# Patient Record
Sex: Male | Born: 1935 | Race: White | Hispanic: No | Marital: Married | Smoking: Former smoker
Health system: Southern US, Community
[De-identification: ages and names within clinical notes are randomized; demographics above are authoritative.]

## PROBLEM LIST (undated history)

## (undated) DIAGNOSIS — R51 Headache: Secondary | ICD-10-CM

## (undated) DIAGNOSIS — Z950 Presence of cardiac pacemaker: Secondary | ICD-10-CM

## (undated) DIAGNOSIS — Z8679 Personal history of other diseases of the circulatory system: Secondary | ICD-10-CM

## (undated) DIAGNOSIS — E118 Type 2 diabetes mellitus with unspecified complications: Secondary | ICD-10-CM

## (undated) DIAGNOSIS — K219 Gastro-esophageal reflux disease without esophagitis: Secondary | ICD-10-CM

## (undated) DIAGNOSIS — F419 Anxiety disorder, unspecified: Secondary | ICD-10-CM

## (undated) DIAGNOSIS — I428 Other cardiomyopathies: Secondary | ICD-10-CM

## (undated) DIAGNOSIS — E785 Hyperlipidemia, unspecified: Secondary | ICD-10-CM

## (undated) DIAGNOSIS — G309 Alzheimer's disease, unspecified: Secondary | ICD-10-CM

## (undated) DIAGNOSIS — F329 Major depressive disorder, single episode, unspecified: Secondary | ICD-10-CM

## (undated) DIAGNOSIS — F0280 Dementia in other diseases classified elsewhere without behavioral disturbance: Secondary | ICD-10-CM

## (undated) DIAGNOSIS — G4733 Obstructive sleep apnea (adult) (pediatric): Secondary | ICD-10-CM

## (undated) DIAGNOSIS — M1990 Unspecified osteoarthritis, unspecified site: Secondary | ICD-10-CM

## (undated) DIAGNOSIS — I2510 Atherosclerotic heart disease of native coronary artery without angina pectoris: Secondary | ICD-10-CM

## (undated) DIAGNOSIS — K5730 Diverticulosis of large intestine without perforation or abscess without bleeding: Secondary | ICD-10-CM

## (undated) DIAGNOSIS — G4730 Sleep apnea, unspecified: Secondary | ICD-10-CM

## (undated) DIAGNOSIS — I639 Cerebral infarction, unspecified: Secondary | ICD-10-CM

## (undated) DIAGNOSIS — K222 Esophageal obstruction: Secondary | ICD-10-CM

## (undated) DIAGNOSIS — R7309 Other abnormal glucose: Secondary | ICD-10-CM

## (undated) DIAGNOSIS — J309 Allergic rhinitis, unspecified: Secondary | ICD-10-CM

## (undated) DIAGNOSIS — Z8546 Personal history of malignant neoplasm of prostate: Secondary | ICD-10-CM

## (undated) DIAGNOSIS — F1010 Alcohol abuse, uncomplicated: Secondary | ICD-10-CM

## (undated) DIAGNOSIS — I10 Essential (primary) hypertension: Secondary | ICD-10-CM

## (undated) DIAGNOSIS — F32A Depression, unspecified: Secondary | ICD-10-CM

## (undated) DIAGNOSIS — I4891 Unspecified atrial fibrillation: Secondary | ICD-10-CM

## (undated) DIAGNOSIS — J45909 Unspecified asthma, uncomplicated: Secondary | ICD-10-CM

## (undated) DIAGNOSIS — R001 Bradycardia, unspecified: Secondary | ICD-10-CM

## (undated) DIAGNOSIS — Z86010 Personal history of colonic polyps: Secondary | ICD-10-CM

## (undated) DIAGNOSIS — I219 Acute myocardial infarction, unspecified: Secondary | ICD-10-CM

## (undated) DIAGNOSIS — J383 Other diseases of vocal cords: Secondary | ICD-10-CM

## (undated) DIAGNOSIS — I5022 Chronic systolic (congestive) heart failure: Secondary | ICD-10-CM

## (undated) DIAGNOSIS — Z95810 Presence of automatic (implantable) cardiac defibrillator: Secondary | ICD-10-CM

## (undated) DIAGNOSIS — E291 Testicular hypofunction: Secondary | ICD-10-CM

## (undated) DIAGNOSIS — C801 Malignant (primary) neoplasm, unspecified: Secondary | ICD-10-CM

## (undated) DIAGNOSIS — T7840XA Allergy, unspecified, initial encounter: Secondary | ICD-10-CM

## (undated) DIAGNOSIS — J189 Pneumonia, unspecified organism: Secondary | ICD-10-CM

## (undated) DIAGNOSIS — G4700 Insomnia, unspecified: Secondary | ICD-10-CM

## (undated) HISTORY — DX: Type 2 diabetes mellitus with unspecified complications: E11.8

## (undated) HISTORY — DX: Dementia in other diseases classified elsewhere, unspecified severity, without behavioral disturbance, psychotic disturbance, mood disturbance, and anxiety: F02.80

## (undated) HISTORY — DX: Other cardiomyopathies: I42.8

## (undated) HISTORY — PX: INSERT / REPLACE / REMOVE PACEMAKER: SUR710

## (undated) HISTORY — DX: Essential (primary) hypertension: I10

## (undated) HISTORY — PX: BACK SURGERY: SHX140

## (undated) HISTORY — DX: Esophageal obstruction: K22.2

## (undated) HISTORY — DX: Personal history of colonic polyps: Z86.010

## (undated) HISTORY — DX: Insomnia, unspecified: G47.00

## (undated) HISTORY — PX: TONSILLECTOMY: SUR1361

## (undated) HISTORY — DX: Testicular hypofunction: E29.1

## (undated) HISTORY — PX: OTHER SURGICAL HISTORY: SHX169

## (undated) HISTORY — DX: Allergic rhinitis, unspecified: J30.9

## (undated) HISTORY — DX: Gastro-esophageal reflux disease without esophagitis: K21.9

## (undated) HISTORY — DX: Alzheimer's disease, unspecified: G30.9

## (undated) HISTORY — DX: Other diseases of vocal cords: J38.3

## (undated) HISTORY — PX: ESOPHAGOGASTRODUODENOSCOPY: SHX1529

## (undated) HISTORY — DX: Hyperlipidemia, unspecified: E78.5

## (undated) HISTORY — PX: COLONOSCOPY: SHX174

## (undated) HISTORY — DX: Diverticulosis of large intestine without perforation or abscess without bleeding: K57.30

## (undated) HISTORY — DX: Personal history of other diseases of the circulatory system: Z86.79

## (undated) HISTORY — DX: Sleep apnea, unspecified: G47.30

## (undated) HISTORY — DX: Personal history of malignant neoplasm of prostate: Z85.46

## (undated) HISTORY — PX: TONSILECTOMY, ADENOIDECTOMY, BILATERAL MYRINGOTOMY AND TUBES: SHX2538

## (undated) HISTORY — DX: Chronic systolic (congestive) heart failure: I50.22

## (undated) HISTORY — PX: KNEE ARTHROSCOPY: SUR90

## (undated) HISTORY — DX: Obstructive sleep apnea (adult) (pediatric): G47.33

## (undated) HISTORY — PX: CARDIAC CATHETERIZATION: SHX172

## (undated) HISTORY — DX: Alcohol abuse, uncomplicated: F10.10

## (undated) HISTORY — DX: Cerebral infarction, unspecified: I63.9

## (undated) HISTORY — DX: Unspecified atrial fibrillation: I48.91

## (undated) HISTORY — DX: Other abnormal glucose: R73.09

## (undated) HISTORY — DX: Atherosclerotic heart disease of native coronary artery without angina pectoris: I25.10

## (undated) HISTORY — DX: Allergy, unspecified, initial encounter: T78.40XA

## (undated) HISTORY — DX: Unspecified asthma, uncomplicated: J45.909

---

## 1991-01-25 DIAGNOSIS — I219 Acute myocardial infarction, unspecified: Secondary | ICD-10-CM

## 1991-01-25 HISTORY — DX: Acute myocardial infarction, unspecified: I21.9

## 1997-07-10 ENCOUNTER — Ambulatory Visit (HOSPITAL_COMMUNITY): Admission: RE | Admit: 1997-07-10 | Discharge: 1997-07-10 | Payer: Self-pay | Admitting: Internal Medicine

## 1998-10-01 ENCOUNTER — Ambulatory Visit (HOSPITAL_COMMUNITY): Admission: RE | Admit: 1998-10-01 | Discharge: 1998-10-01 | Payer: Self-pay | Admitting: Urology

## 1999-02-19 ENCOUNTER — Encounter (INDEPENDENT_AMBULATORY_CARE_PROVIDER_SITE_OTHER): Payer: Self-pay | Admitting: Specialist

## 1999-02-19 ENCOUNTER — Other Ambulatory Visit: Admission: RE | Admit: 1999-02-19 | Discharge: 1999-02-19 | Payer: Self-pay | Admitting: Internal Medicine

## 1999-06-30 ENCOUNTER — Encounter: Payer: Self-pay | Admitting: Emergency Medicine

## 1999-06-30 ENCOUNTER — Emergency Department (HOSPITAL_COMMUNITY): Admission: EM | Admit: 1999-06-30 | Discharge: 1999-06-30 | Payer: Self-pay | Admitting: Emergency Medicine

## 1999-12-10 ENCOUNTER — Encounter: Payer: Self-pay | Admitting: Internal Medicine

## 1999-12-10 ENCOUNTER — Ambulatory Visit (HOSPITAL_COMMUNITY): Admission: RE | Admit: 1999-12-10 | Discharge: 1999-12-10 | Payer: Self-pay | Admitting: Internal Medicine

## 2000-01-07 ENCOUNTER — Other Ambulatory Visit: Admission: RE | Admit: 2000-01-07 | Discharge: 2000-01-07 | Payer: Self-pay | Admitting: Urology

## 2000-01-07 ENCOUNTER — Encounter (INDEPENDENT_AMBULATORY_CARE_PROVIDER_SITE_OTHER): Payer: Self-pay | Admitting: Specialist

## 2000-01-25 DIAGNOSIS — C801 Malignant (primary) neoplasm, unspecified: Secondary | ICD-10-CM

## 2000-01-25 HISTORY — DX: Malignant (primary) neoplasm, unspecified: C80.1

## 2000-02-25 DIAGNOSIS — Z8546 Personal history of malignant neoplasm of prostate: Secondary | ICD-10-CM

## 2000-02-25 HISTORY — PX: PROSTATE SURGERY: SHX751

## 2000-02-25 HISTORY — DX: Personal history of malignant neoplasm of prostate: Z85.46

## 2000-03-08 ENCOUNTER — Encounter (INDEPENDENT_AMBULATORY_CARE_PROVIDER_SITE_OTHER): Payer: Self-pay | Admitting: Specialist

## 2000-03-08 ENCOUNTER — Inpatient Hospital Stay (HOSPITAL_COMMUNITY): Admission: RE | Admit: 2000-03-08 | Discharge: 2000-03-12 | Payer: Self-pay | Admitting: Urology

## 2000-07-07 ENCOUNTER — Ambulatory Visit (HOSPITAL_COMMUNITY): Admission: RE | Admit: 2000-07-07 | Discharge: 2000-07-07 | Payer: Self-pay | Admitting: Internal Medicine

## 2002-06-13 ENCOUNTER — Emergency Department (HOSPITAL_COMMUNITY): Admission: EM | Admit: 2002-06-13 | Discharge: 2002-06-13 | Payer: Self-pay | Admitting: Emergency Medicine

## 2004-02-24 ENCOUNTER — Ambulatory Visit: Payer: Self-pay | Admitting: Internal Medicine

## 2004-03-02 ENCOUNTER — Ambulatory Visit: Payer: Self-pay | Admitting: Internal Medicine

## 2004-03-19 ENCOUNTER — Ambulatory Visit: Payer: Self-pay | Admitting: Internal Medicine

## 2004-03-24 ENCOUNTER — Ambulatory Visit: Payer: Self-pay | Admitting: Internal Medicine

## 2004-03-29 ENCOUNTER — Ambulatory Visit: Payer: Self-pay | Admitting: Internal Medicine

## 2004-03-31 ENCOUNTER — Ambulatory Visit: Payer: Self-pay | Admitting: Internal Medicine

## 2004-04-08 ENCOUNTER — Ambulatory Visit: Payer: Self-pay | Admitting: Internal Medicine

## 2004-04-12 ENCOUNTER — Ambulatory Visit: Payer: Self-pay | Admitting: Internal Medicine

## 2004-04-19 ENCOUNTER — Ambulatory Visit: Payer: Self-pay | Admitting: Internal Medicine

## 2004-04-28 ENCOUNTER — Ambulatory Visit: Payer: Self-pay | Admitting: Internal Medicine

## 2004-05-03 ENCOUNTER — Ambulatory Visit: Payer: Self-pay | Admitting: Internal Medicine

## 2004-05-13 ENCOUNTER — Ambulatory Visit: Payer: Self-pay | Admitting: Internal Medicine

## 2004-05-19 ENCOUNTER — Ambulatory Visit: Payer: Self-pay | Admitting: Internal Medicine

## 2004-05-26 ENCOUNTER — Ambulatory Visit: Payer: Self-pay | Admitting: Internal Medicine

## 2004-05-27 ENCOUNTER — Ambulatory Visit (HOSPITAL_COMMUNITY): Admission: RE | Admit: 2004-05-27 | Discharge: 2004-05-27 | Payer: Self-pay | Admitting: Internal Medicine

## 2004-05-27 ENCOUNTER — Ambulatory Visit: Payer: Self-pay | Admitting: Internal Medicine

## 2004-06-03 ENCOUNTER — Ambulatory Visit: Payer: Self-pay | Admitting: Internal Medicine

## 2004-06-24 ENCOUNTER — Ambulatory Visit: Payer: Self-pay | Admitting: Internal Medicine

## 2004-07-08 ENCOUNTER — Ambulatory Visit: Payer: Self-pay | Admitting: Internal Medicine

## 2004-07-13 ENCOUNTER — Ambulatory Visit: Payer: Self-pay | Admitting: Internal Medicine

## 2004-07-29 ENCOUNTER — Ambulatory Visit: Payer: Self-pay | Admitting: Internal Medicine

## 2004-08-02 ENCOUNTER — Ambulatory Visit: Payer: Self-pay | Admitting: Internal Medicine

## 2004-08-09 ENCOUNTER — Ambulatory Visit: Payer: Self-pay | Admitting: Internal Medicine

## 2004-08-16 ENCOUNTER — Ambulatory Visit: Payer: Self-pay | Admitting: Internal Medicine

## 2004-08-17 ENCOUNTER — Ambulatory Visit: Payer: Self-pay | Admitting: Internal Medicine

## 2004-08-31 ENCOUNTER — Ambulatory Visit: Payer: Self-pay | Admitting: Internal Medicine

## 2004-09-09 ENCOUNTER — Ambulatory Visit: Payer: Self-pay | Admitting: Internal Medicine

## 2004-09-17 ENCOUNTER — Ambulatory Visit: Payer: Self-pay | Admitting: Internal Medicine

## 2004-09-22 ENCOUNTER — Ambulatory Visit: Payer: Self-pay | Admitting: Internal Medicine

## 2004-09-30 ENCOUNTER — Ambulatory Visit: Payer: Self-pay | Admitting: Internal Medicine

## 2004-10-06 ENCOUNTER — Ambulatory Visit: Payer: Self-pay | Admitting: Internal Medicine

## 2004-10-13 ENCOUNTER — Ambulatory Visit: Payer: Self-pay | Admitting: Internal Medicine

## 2004-10-17 ENCOUNTER — Emergency Department (HOSPITAL_COMMUNITY): Admission: EM | Admit: 2004-10-17 | Discharge: 2004-10-17 | Payer: Self-pay | Admitting: Emergency Medicine

## 2004-11-11 ENCOUNTER — Ambulatory Visit: Payer: Self-pay | Admitting: Internal Medicine

## 2004-11-18 ENCOUNTER — Ambulatory Visit: Payer: Self-pay | Admitting: Internal Medicine

## 2004-11-26 ENCOUNTER — Ambulatory Visit: Payer: Self-pay | Admitting: Internal Medicine

## 2004-12-10 ENCOUNTER — Ambulatory Visit: Payer: Self-pay | Admitting: Internal Medicine

## 2004-12-13 ENCOUNTER — Encounter: Admission: RE | Admit: 2004-12-13 | Discharge: 2004-12-13 | Payer: Self-pay | Admitting: Internal Medicine

## 2004-12-22 ENCOUNTER — Ambulatory Visit: Payer: Self-pay | Admitting: Internal Medicine

## 2004-12-29 ENCOUNTER — Ambulatory Visit: Payer: Self-pay | Admitting: Internal Medicine

## 2005-01-05 ENCOUNTER — Ambulatory Visit: Payer: Self-pay | Admitting: Internal Medicine

## 2005-01-06 ENCOUNTER — Ambulatory Visit: Payer: Self-pay | Admitting: Internal Medicine

## 2005-01-12 ENCOUNTER — Ambulatory Visit: Payer: Self-pay | Admitting: Internal Medicine

## 2005-01-26 ENCOUNTER — Ambulatory Visit: Payer: Self-pay | Admitting: Internal Medicine

## 2005-02-17 ENCOUNTER — Ambulatory Visit: Payer: Self-pay | Admitting: Internal Medicine

## 2005-02-25 ENCOUNTER — Ambulatory Visit: Payer: Self-pay | Admitting: Internal Medicine

## 2005-03-16 ENCOUNTER — Ambulatory Visit: Payer: Self-pay | Admitting: Internal Medicine

## 2005-03-24 ENCOUNTER — Ambulatory Visit: Payer: Self-pay | Admitting: Internal Medicine

## 2005-03-25 ENCOUNTER — Ambulatory Visit: Payer: Self-pay | Admitting: Internal Medicine

## 2005-04-04 ENCOUNTER — Ambulatory Visit: Payer: Self-pay | Admitting: Internal Medicine

## 2005-04-06 ENCOUNTER — Ambulatory Visit: Payer: Self-pay | Admitting: Cardiology

## 2005-04-11 ENCOUNTER — Ambulatory Visit: Payer: Self-pay | Admitting: Internal Medicine

## 2005-04-13 ENCOUNTER — Ambulatory Visit: Payer: Self-pay

## 2005-04-18 ENCOUNTER — Ambulatory Visit: Payer: Self-pay | Admitting: Internal Medicine

## 2005-04-25 ENCOUNTER — Ambulatory Visit: Payer: Self-pay | Admitting: Internal Medicine

## 2005-05-04 ENCOUNTER — Ambulatory Visit: Payer: Self-pay | Admitting: Internal Medicine

## 2005-05-05 ENCOUNTER — Ambulatory Visit: Payer: Self-pay | Admitting: Internal Medicine

## 2005-05-12 ENCOUNTER — Ambulatory Visit: Payer: Self-pay | Admitting: Internal Medicine

## 2005-05-16 ENCOUNTER — Ambulatory Visit: Payer: Self-pay | Admitting: Internal Medicine

## 2005-06-17 ENCOUNTER — Ambulatory Visit: Payer: Self-pay | Admitting: Internal Medicine

## 2005-06-27 ENCOUNTER — Ambulatory Visit: Payer: Self-pay | Admitting: Internal Medicine

## 2005-07-04 ENCOUNTER — Ambulatory Visit: Payer: Self-pay | Admitting: Internal Medicine

## 2005-07-14 ENCOUNTER — Ambulatory Visit: Payer: Self-pay | Admitting: Internal Medicine

## 2005-07-18 ENCOUNTER — Ambulatory Visit: Payer: Self-pay | Admitting: Internal Medicine

## 2005-08-03 ENCOUNTER — Ambulatory Visit: Payer: Self-pay | Admitting: Internal Medicine

## 2005-08-24 ENCOUNTER — Ambulatory Visit: Payer: Self-pay | Admitting: Internal Medicine

## 2005-08-25 ENCOUNTER — Ambulatory Visit: Payer: Self-pay | Admitting: Internal Medicine

## 2005-08-31 ENCOUNTER — Ambulatory Visit: Payer: Self-pay | Admitting: Internal Medicine

## 2005-09-05 ENCOUNTER — Ambulatory Visit: Payer: Self-pay | Admitting: Internal Medicine

## 2005-09-19 ENCOUNTER — Ambulatory Visit: Payer: Self-pay | Admitting: Internal Medicine

## 2005-09-28 ENCOUNTER — Ambulatory Visit: Payer: Self-pay | Admitting: Internal Medicine

## 2005-10-05 ENCOUNTER — Ambulatory Visit: Payer: Self-pay | Admitting: Internal Medicine

## 2005-10-20 ENCOUNTER — Ambulatory Visit: Payer: Self-pay | Admitting: Internal Medicine

## 2005-10-31 ENCOUNTER — Ambulatory Visit: Payer: Self-pay | Admitting: Internal Medicine

## 2005-11-07 ENCOUNTER — Ambulatory Visit: Payer: Self-pay | Admitting: Internal Medicine

## 2005-11-14 ENCOUNTER — Ambulatory Visit: Payer: Self-pay | Admitting: Internal Medicine

## 2005-11-24 ENCOUNTER — Ambulatory Visit: Payer: Self-pay | Admitting: Internal Medicine

## 2005-12-07 ENCOUNTER — Ambulatory Visit: Payer: Self-pay | Admitting: Internal Medicine

## 2005-12-26 ENCOUNTER — Ambulatory Visit: Payer: Self-pay | Admitting: Internal Medicine

## 2006-01-09 ENCOUNTER — Ambulatory Visit: Payer: Self-pay | Admitting: Internal Medicine

## 2006-01-19 ENCOUNTER — Ambulatory Visit: Payer: Self-pay | Admitting: Internal Medicine

## 2006-01-26 ENCOUNTER — Ambulatory Visit: Payer: Self-pay | Admitting: Internal Medicine

## 2006-01-26 ENCOUNTER — Ambulatory Visit: Payer: Self-pay | Admitting: Psychology

## 2006-02-08 ENCOUNTER — Ambulatory Visit: Payer: Self-pay | Admitting: Internal Medicine

## 2006-02-22 ENCOUNTER — Ambulatory Visit: Payer: Self-pay | Admitting: Internal Medicine

## 2006-03-08 ENCOUNTER — Ambulatory Visit: Payer: Self-pay | Admitting: Internal Medicine

## 2006-03-15 ENCOUNTER — Ambulatory Visit: Payer: Self-pay | Admitting: Internal Medicine

## 2006-03-22 ENCOUNTER — Ambulatory Visit: Payer: Self-pay | Admitting: Internal Medicine

## 2006-03-27 ENCOUNTER — Ambulatory Visit: Payer: Self-pay | Admitting: Internal Medicine

## 2006-04-03 ENCOUNTER — Ambulatory Visit: Payer: Self-pay | Admitting: Internal Medicine

## 2006-04-10 ENCOUNTER — Ambulatory Visit: Payer: Self-pay | Admitting: Internal Medicine

## 2006-04-19 ENCOUNTER — Ambulatory Visit: Payer: Self-pay | Admitting: Internal Medicine

## 2006-04-27 ENCOUNTER — Ambulatory Visit: Payer: Self-pay | Admitting: Internal Medicine

## 2006-05-01 ENCOUNTER — Ambulatory Visit: Payer: Self-pay | Admitting: Internal Medicine

## 2006-05-11 ENCOUNTER — Ambulatory Visit: Payer: Self-pay | Admitting: Internal Medicine

## 2006-05-17 ENCOUNTER — Ambulatory Visit: Payer: Self-pay | Admitting: Internal Medicine

## 2006-06-07 ENCOUNTER — Ambulatory Visit: Payer: Self-pay | Admitting: Internal Medicine

## 2006-06-12 ENCOUNTER — Ambulatory Visit: Payer: Self-pay | Admitting: Internal Medicine

## 2006-06-21 ENCOUNTER — Ambulatory Visit: Payer: Self-pay | Admitting: Internal Medicine

## 2006-06-23 ENCOUNTER — Ambulatory Visit: Payer: Self-pay | Admitting: Internal Medicine

## 2006-06-28 ENCOUNTER — Ambulatory Visit: Payer: Self-pay | Admitting: Internal Medicine

## 2006-07-05 ENCOUNTER — Ambulatory Visit: Payer: Self-pay | Admitting: Internal Medicine

## 2006-07-12 ENCOUNTER — Ambulatory Visit: Payer: Self-pay | Admitting: Internal Medicine

## 2006-07-31 ENCOUNTER — Ambulatory Visit: Payer: Self-pay | Admitting: Internal Medicine

## 2006-08-04 ENCOUNTER — Ambulatory Visit: Payer: Self-pay | Admitting: Cardiology

## 2006-08-04 ENCOUNTER — Encounter: Payer: Self-pay | Admitting: Internal Medicine

## 2006-08-04 ENCOUNTER — Ambulatory Visit: Payer: Self-pay | Admitting: Internal Medicine

## 2006-08-10 ENCOUNTER — Ambulatory Visit: Payer: Self-pay | Admitting: Internal Medicine

## 2006-09-06 ENCOUNTER — Ambulatory Visit: Payer: Self-pay | Admitting: Internal Medicine

## 2006-09-22 ENCOUNTER — Ambulatory Visit: Payer: Self-pay | Admitting: Internal Medicine

## 2006-10-04 ENCOUNTER — Ambulatory Visit: Payer: Self-pay | Admitting: Internal Medicine

## 2006-10-09 ENCOUNTER — Ambulatory Visit: Payer: Self-pay | Admitting: Internal Medicine

## 2006-10-19 ENCOUNTER — Ambulatory Visit: Payer: Self-pay | Admitting: Internal Medicine

## 2006-10-25 ENCOUNTER — Ambulatory Visit: Payer: Self-pay | Admitting: Internal Medicine

## 2006-11-01 ENCOUNTER — Ambulatory Visit: Payer: Self-pay | Admitting: Internal Medicine

## 2006-11-15 ENCOUNTER — Ambulatory Visit: Payer: Self-pay | Admitting: Internal Medicine

## 2006-11-16 ENCOUNTER — Ambulatory Visit: Payer: Self-pay | Admitting: Internal Medicine

## 2006-11-16 DIAGNOSIS — R739 Hyperglycemia, unspecified: Secondary | ICD-10-CM | POA: Insufficient documentation

## 2006-11-16 DIAGNOSIS — I10 Essential (primary) hypertension: Secondary | ICD-10-CM | POA: Insufficient documentation

## 2006-11-16 DIAGNOSIS — Z8679 Personal history of other diseases of the circulatory system: Secondary | ICD-10-CM

## 2006-11-16 DIAGNOSIS — I4891 Unspecified atrial fibrillation: Secondary | ICD-10-CM

## 2006-11-16 DIAGNOSIS — J45909 Unspecified asthma, uncomplicated: Secondary | ICD-10-CM

## 2006-11-16 DIAGNOSIS — I2510 Atherosclerotic heart disease of native coronary artery without angina pectoris: Secondary | ICD-10-CM

## 2006-11-16 DIAGNOSIS — R7309 Other abnormal glucose: Secondary | ICD-10-CM

## 2006-11-16 DIAGNOSIS — J209 Acute bronchitis, unspecified: Secondary | ICD-10-CM

## 2006-11-16 DIAGNOSIS — I252 Old myocardial infarction: Secondary | ICD-10-CM | POA: Insufficient documentation

## 2006-11-16 HISTORY — DX: Unspecified asthma, uncomplicated: J45.909

## 2006-11-16 HISTORY — DX: Other abnormal glucose: R73.09

## 2006-11-16 HISTORY — DX: Personal history of other diseases of the circulatory system: Z86.79

## 2006-11-16 HISTORY — DX: Essential (primary) hypertension: I10

## 2006-11-16 HISTORY — DX: Unspecified atrial fibrillation: I48.91

## 2006-11-16 HISTORY — DX: Atherosclerotic heart disease of native coronary artery without angina pectoris: I25.10

## 2006-11-20 ENCOUNTER — Ambulatory Visit: Payer: Self-pay | Admitting: Internal Medicine

## 2006-11-29 ENCOUNTER — Ambulatory Visit: Payer: Self-pay | Admitting: Internal Medicine

## 2006-12-04 ENCOUNTER — Ambulatory Visit: Payer: Self-pay | Admitting: Internal Medicine

## 2006-12-13 ENCOUNTER — Ambulatory Visit: Payer: Self-pay | Admitting: Internal Medicine

## 2006-12-13 LAB — CONVERTED CEMR LAB
ALT: 16 units/L (ref 0–53)
AST: 24 units/L (ref 0–37)
Albumin: 4.2 g/dL (ref 3.5–5.2)
Alkaline Phosphatase: 69 units/L (ref 39–117)
BUN: 13 mg/dL (ref 6–23)
Basophils Absolute: 0 10*3/uL (ref 0.0–0.1)
Basophils Relative: 0.4 % (ref 0.0–1.0)
Bilirubin, Direct: 0.2 mg/dL (ref 0.0–0.3)
CO2: 33 meq/L — ABNORMAL HIGH (ref 19–32)
Calcium: 10 mg/dL (ref 8.4–10.5)
Chloride: 101 meq/L (ref 96–112)
Cholesterol: 184 mg/dL (ref 0–200)
Creatinine, Ser: 1 mg/dL (ref 0.4–1.5)
Eosinophils Absolute: 0.1 10*3/uL (ref 0.0–0.6)
Eosinophils Relative: 1.9 % (ref 0.0–5.0)
GFR calc Af Amer: 95 mL/min
GFR calc non Af Amer: 79 mL/min
Glucose, Bld: 99 mg/dL (ref 70–99)
Glucose, Urine, Semiquant: NEGATIVE
HCT: 42.1 % (ref 39.0–52.0)
HDL: 59.9 mg/dL (ref >39.0)
Hemoglobin: 14.6 g/dL (ref 13.0–17.0)
Ketones, urine, test strip: NEGATIVE
LDL Cholesterol: 98 mg/dL (ref 0–99)
Lymphocytes Relative: 34.9 % (ref 12.0–46.0)
MCHC: 34.7 g/dL (ref 30.0–36.0)
MCV: 92.7 fL (ref 78.0–100.0)
Monocytes Absolute: 0.5 10*3/uL (ref 0.2–0.7)
Monocytes Relative: 8.5 % (ref 3.0–11.0)
Neutro Abs: 3.1 10*3/uL (ref 1.4–7.7)
Neutrophils Relative %: 54.3 % (ref 43.0–77.0)
Nitrite: NEGATIVE
PSA: 0.03 ng/mL — ABNORMAL LOW (ref 0.10–4.00)
Platelets: 281 10*3/uL (ref 150–400)
Potassium: 4.3 meq/L (ref 3.5–5.1)
RBC: 4.54 M/uL (ref 4.22–5.81)
RDW: 11.7 % (ref 11.5–14.6)
Sodium: 144 meq/L (ref 135–145)
Specific Gravity, Urine: >=1.03
TSH: 2.2 microintl units/mL (ref 0.35–5.50)
Total Bilirubin: 1.1 mg/dL (ref 0.3–1.2)
Total CHOL/HDL Ratio: 3.1
Total Protein: 7 g/dL (ref 6.0–8.3)
Triglycerides: 133 mg/dL (ref 0–149)
Urobilinogen, UA: 0.2
VLDL: 27 mg/dL (ref 0–40)
WBC Urine, dipstick: NEGATIVE
WBC: 5.7 10*3/uL (ref 4.5–10.5)
pH: 6

## 2006-12-20 ENCOUNTER — Ambulatory Visit: Payer: Self-pay | Admitting: Internal Medicine

## 2007-01-01 ENCOUNTER — Ambulatory Visit: Payer: Self-pay | Admitting: Internal Medicine

## 2007-01-05 ENCOUNTER — Telehealth (INDEPENDENT_AMBULATORY_CARE_PROVIDER_SITE_OTHER): Payer: Self-pay | Admitting: *Deleted

## 2007-01-08 ENCOUNTER — Encounter: Payer: Self-pay | Admitting: Internal Medicine

## 2007-01-29 ENCOUNTER — Ambulatory Visit: Payer: Self-pay | Admitting: Internal Medicine

## 2007-02-05 ENCOUNTER — Ambulatory Visit: Payer: Self-pay | Admitting: Internal Medicine

## 2007-02-21 ENCOUNTER — Ambulatory Visit: Payer: Self-pay | Admitting: Internal Medicine

## 2007-02-24 ENCOUNTER — Encounter: Payer: Self-pay | Admitting: Internal Medicine

## 2007-03-02 ENCOUNTER — Ambulatory Visit: Payer: Self-pay | Admitting: Internal Medicine

## 2007-03-08 ENCOUNTER — Ambulatory Visit: Payer: Self-pay | Admitting: Internal Medicine

## 2007-03-12 ENCOUNTER — Ambulatory Visit: Payer: Self-pay | Admitting: Internal Medicine

## 2007-03-19 ENCOUNTER — Ambulatory Visit: Payer: Self-pay | Admitting: Internal Medicine

## 2007-03-30 ENCOUNTER — Ambulatory Visit: Payer: Self-pay | Admitting: Internal Medicine

## 2007-04-09 ENCOUNTER — Telehealth: Payer: Self-pay | Admitting: Internal Medicine

## 2007-04-09 ENCOUNTER — Telehealth (INDEPENDENT_AMBULATORY_CARE_PROVIDER_SITE_OTHER): Payer: Self-pay | Admitting: *Deleted

## 2007-04-09 ENCOUNTER — Ambulatory Visit: Payer: Self-pay | Admitting: Internal Medicine

## 2007-04-18 ENCOUNTER — Ambulatory Visit: Payer: Self-pay | Admitting: Internal Medicine

## 2007-04-23 ENCOUNTER — Encounter: Payer: Self-pay | Admitting: Internal Medicine

## 2007-04-30 ENCOUNTER — Ambulatory Visit: Payer: Self-pay | Admitting: Internal Medicine

## 2007-05-09 ENCOUNTER — Ambulatory Visit: Payer: Self-pay | Admitting: Internal Medicine

## 2007-05-11 ENCOUNTER — Ambulatory Visit: Payer: Self-pay | Admitting: Internal Medicine

## 2007-05-17 ENCOUNTER — Ambulatory Visit: Payer: Self-pay | Admitting: Internal Medicine

## 2007-05-21 ENCOUNTER — Ambulatory Visit: Payer: Self-pay | Admitting: Internal Medicine

## 2007-05-23 ENCOUNTER — Ambulatory Visit: Payer: Self-pay | Admitting: Internal Medicine

## 2007-05-24 ENCOUNTER — Encounter: Admission: RE | Admit: 2007-05-24 | Discharge: 2007-05-24 | Payer: Self-pay | Admitting: Internal Medicine

## 2007-05-28 ENCOUNTER — Telehealth: Payer: Self-pay | Admitting: Internal Medicine

## 2007-05-30 ENCOUNTER — Ambulatory Visit: Payer: Self-pay | Admitting: Internal Medicine

## 2007-06-04 ENCOUNTER — Ambulatory Visit: Payer: Self-pay | Admitting: Internal Medicine

## 2007-06-11 ENCOUNTER — Ambulatory Visit: Payer: Self-pay | Admitting: Internal Medicine

## 2007-06-20 ENCOUNTER — Ambulatory Visit: Payer: Self-pay | Admitting: Internal Medicine

## 2007-06-28 ENCOUNTER — Ambulatory Visit: Payer: Self-pay | Admitting: Internal Medicine

## 2007-07-04 ENCOUNTER — Ambulatory Visit: Payer: Self-pay | Admitting: Internal Medicine

## 2007-07-05 ENCOUNTER — Ambulatory Visit: Payer: Self-pay | Admitting: Internal Medicine

## 2007-07-11 ENCOUNTER — Ambulatory Visit: Payer: Self-pay | Admitting: Internal Medicine

## 2007-07-31 ENCOUNTER — Ambulatory Visit: Payer: Self-pay | Admitting: Internal Medicine

## 2007-08-08 ENCOUNTER — Ambulatory Visit: Payer: Self-pay | Admitting: Internal Medicine

## 2007-08-14 ENCOUNTER — Ambulatory Visit: Payer: Self-pay | Admitting: Internal Medicine

## 2007-08-22 ENCOUNTER — Ambulatory Visit: Payer: Self-pay | Admitting: Internal Medicine

## 2007-08-29 ENCOUNTER — Ambulatory Visit: Payer: Self-pay | Admitting: Internal Medicine

## 2007-09-10 ENCOUNTER — Encounter: Payer: Self-pay | Admitting: Internal Medicine

## 2007-09-12 ENCOUNTER — Ambulatory Visit: Payer: Self-pay | Admitting: Internal Medicine

## 2007-09-19 ENCOUNTER — Ambulatory Visit: Payer: Self-pay | Admitting: Internal Medicine

## 2007-09-26 ENCOUNTER — Ambulatory Visit: Payer: Self-pay | Admitting: Internal Medicine

## 2007-10-05 ENCOUNTER — Ambulatory Visit: Payer: Self-pay | Admitting: Internal Medicine

## 2007-10-17 ENCOUNTER — Ambulatory Visit: Payer: Self-pay | Admitting: Internal Medicine

## 2007-10-22 ENCOUNTER — Ambulatory Visit: Payer: Self-pay | Admitting: Internal Medicine

## 2007-10-29 ENCOUNTER — Ambulatory Visit: Payer: Self-pay | Admitting: Internal Medicine

## 2007-11-07 ENCOUNTER — Ambulatory Visit: Payer: Self-pay | Admitting: Internal Medicine

## 2007-11-09 ENCOUNTER — Encounter: Payer: Self-pay | Admitting: Internal Medicine

## 2007-11-15 ENCOUNTER — Ambulatory Visit: Payer: Self-pay | Admitting: Internal Medicine

## 2007-11-21 ENCOUNTER — Ambulatory Visit: Payer: Self-pay | Admitting: Internal Medicine

## 2007-12-03 ENCOUNTER — Ambulatory Visit: Payer: Self-pay | Admitting: Internal Medicine

## 2007-12-03 ENCOUNTER — Telehealth: Payer: Self-pay | Admitting: Internal Medicine

## 2007-12-04 ENCOUNTER — Ambulatory Visit: Payer: Self-pay | Admitting: Internal Medicine

## 2007-12-05 ENCOUNTER — Telehealth: Payer: Self-pay | Admitting: Internal Medicine

## 2007-12-07 LAB — CONVERTED CEMR LAB
Basophils Absolute: 0 10*3/uL (ref 0.0–0.1)
Chloride: 104 meq/L (ref 96–112)
Eosinophils Absolute: 0.2 10*3/uL (ref 0.0–0.7)
Eosinophils Relative: 3.9 % (ref 0.0–5.0)
GFR calc Af Amer: 95 mL/min
GFR calc non Af Amer: 79 mL/min
Glucose, Bld: 108 mg/dL — ABNORMAL HIGH (ref 70–99)
HCT: 42.2 % (ref 39.0–52.0)
Monocytes Absolute: 0.4 10*3/uL (ref 0.1–1.0)
Neutro Abs: 2 10*3/uL (ref 1.4–7.7)
Platelets: 217 10*3/uL (ref 150–400)
RBC: 4.49 M/uL (ref 4.22–5.81)
RDW: 12 % (ref 11.5–14.6)
Sodium: 143 meq/L (ref 135–145)
WBC: 4 10*3/uL — ABNORMAL LOW (ref 4.5–10.5)

## 2007-12-13 ENCOUNTER — Ambulatory Visit: Payer: Self-pay | Admitting: Internal Medicine

## 2007-12-24 ENCOUNTER — Ambulatory Visit: Payer: Self-pay | Admitting: Internal Medicine

## 2008-01-02 ENCOUNTER — Ambulatory Visit: Payer: Self-pay | Admitting: Internal Medicine

## 2008-01-09 ENCOUNTER — Ambulatory Visit: Payer: Self-pay | Admitting: Internal Medicine

## 2008-01-15 ENCOUNTER — Telehealth: Payer: Self-pay | Admitting: Internal Medicine

## 2008-01-16 ENCOUNTER — Ambulatory Visit: Payer: Self-pay | Admitting: Internal Medicine

## 2008-01-16 ENCOUNTER — Encounter: Payer: Self-pay | Admitting: Internal Medicine

## 2008-01-23 ENCOUNTER — Ambulatory Visit: Payer: Self-pay | Admitting: Internal Medicine

## 2008-01-25 ENCOUNTER — Telehealth: Payer: Self-pay | Admitting: Internal Medicine

## 2008-01-28 ENCOUNTER — Ambulatory Visit: Payer: Self-pay | Admitting: Internal Medicine

## 2008-01-28 ENCOUNTER — Telehealth: Payer: Self-pay | Admitting: Internal Medicine

## 2008-01-28 ENCOUNTER — Encounter: Payer: Self-pay | Admitting: Internal Medicine

## 2008-01-28 DIAGNOSIS — K222 Esophageal obstruction: Secondary | ICD-10-CM

## 2008-01-28 HISTORY — DX: Esophageal obstruction: K22.2

## 2008-01-28 LAB — CONVERTED CEMR LAB
Basophils Absolute: 0.1 10*3/uL (ref 0.0–0.1)
Eosinophils Relative: 1.3 % (ref 0.0–5.0)
HCT: 32.9 % — ABNORMAL LOW (ref 39.0–52.0)
Hemoglobin: 11.2 g/dL — ABNORMAL LOW (ref 13.0–17.0)
MCHC: 34.1 g/dL (ref 30.0–36.0)
MCV: 94.1 fL (ref 78.0–100.0)
Monocytes Absolute: 0.4 10*3/uL (ref 0.1–1.0)
Monocytes Relative: 6.4 % (ref 3.0–12.0)
Neutrophils Relative %: 65.8 % (ref 43.0–77.0)
Platelets: 238 10*3/uL (ref 150–400)
RBC: 3.5 M/uL — ABNORMAL LOW (ref 4.22–5.81)
RDW: 12.1 % (ref 11.5–14.6)

## 2008-01-30 ENCOUNTER — Telehealth: Payer: Self-pay | Admitting: Internal Medicine

## 2008-01-30 ENCOUNTER — Ambulatory Visit: Payer: Self-pay | Admitting: Internal Medicine

## 2008-01-30 LAB — CONVERTED CEMR LAB
Eosinophils Absolute: 0.1 10*3/uL (ref 0.0–0.7)
Hemoglobin: 10.1 g/dL — ABNORMAL LOW (ref 13.0–17.0)
MCHC: 34.2 g/dL (ref 30.0–36.0)
Monocytes Absolute: 0.4 10*3/uL (ref 0.1–1.0)
RDW: 12.4 % (ref 11.5–14.6)
WBC: 4.2 10*3/uL — ABNORMAL LOW (ref 4.5–10.5)

## 2008-01-31 ENCOUNTER — Ambulatory Visit: Payer: Self-pay | Admitting: Internal Medicine

## 2008-02-06 ENCOUNTER — Ambulatory Visit: Payer: Self-pay | Admitting: Internal Medicine

## 2008-02-06 ENCOUNTER — Encounter: Payer: Self-pay | Admitting: Internal Medicine

## 2008-02-06 ENCOUNTER — Ambulatory Visit (HOSPITAL_COMMUNITY): Admission: RE | Admit: 2008-02-06 | Discharge: 2008-02-06 | Payer: Self-pay | Admitting: Internal Medicine

## 2008-02-12 ENCOUNTER — Ambulatory Visit: Payer: Self-pay | Admitting: Internal Medicine

## 2008-02-12 ENCOUNTER — Encounter: Payer: Self-pay | Admitting: Internal Medicine

## 2008-02-13 ENCOUNTER — Telehealth: Payer: Self-pay | Admitting: Internal Medicine

## 2008-02-14 DIAGNOSIS — E785 Hyperlipidemia, unspecified: Secondary | ICD-10-CM | POA: Insufficient documentation

## 2008-02-14 DIAGNOSIS — Z860100 Personal history of colon polyps, unspecified: Secondary | ICD-10-CM

## 2008-02-14 DIAGNOSIS — K219 Gastro-esophageal reflux disease without esophagitis: Secondary | ICD-10-CM | POA: Insufficient documentation

## 2008-02-14 DIAGNOSIS — K5730 Diverticulosis of large intestine without perforation or abscess without bleeding: Secondary | ICD-10-CM | POA: Insufficient documentation

## 2008-02-14 HISTORY — DX: Diverticulosis of large intestine without perforation or abscess without bleeding: K57.30

## 2008-02-14 HISTORY — DX: Personal history of colonic polyps: Z86.010

## 2008-02-14 HISTORY — DX: Personal history of colon polyps, unspecified: Z86.0100

## 2008-02-14 HISTORY — DX: Hyperlipidemia, unspecified: E78.5

## 2008-02-14 HISTORY — DX: Gastro-esophageal reflux disease without esophagitis: K21.9

## 2008-02-18 ENCOUNTER — Ambulatory Visit: Payer: Self-pay | Admitting: Internal Medicine

## 2008-02-27 ENCOUNTER — Ambulatory Visit: Payer: Self-pay | Admitting: Internal Medicine

## 2008-03-03 ENCOUNTER — Telehealth: Payer: Self-pay | Admitting: Internal Medicine

## 2008-03-03 ENCOUNTER — Ambulatory Visit: Payer: Self-pay | Admitting: Internal Medicine

## 2008-03-05 ENCOUNTER — Telehealth (INDEPENDENT_AMBULATORY_CARE_PROVIDER_SITE_OTHER): Payer: Self-pay | Admitting: *Deleted

## 2008-03-08 ENCOUNTER — Emergency Department (HOSPITAL_COMMUNITY): Admission: EM | Admit: 2008-03-08 | Discharge: 2008-03-08 | Payer: Self-pay | Admitting: Emergency Medicine

## 2008-03-13 ENCOUNTER — Ambulatory Visit: Payer: Self-pay | Admitting: Internal Medicine

## 2008-03-18 ENCOUNTER — Ambulatory Visit: Payer: Self-pay | Admitting: Internal Medicine

## 2008-03-18 ENCOUNTER — Telehealth (INDEPENDENT_AMBULATORY_CARE_PROVIDER_SITE_OTHER): Payer: Self-pay | Admitting: *Deleted

## 2008-03-26 ENCOUNTER — Ambulatory Visit: Payer: Self-pay | Admitting: Internal Medicine

## 2008-04-03 ENCOUNTER — Ambulatory Visit: Payer: Self-pay | Admitting: Internal Medicine

## 2008-04-09 ENCOUNTER — Ambulatory Visit: Payer: Self-pay | Admitting: Internal Medicine

## 2008-04-15 ENCOUNTER — Ambulatory Visit: Payer: Self-pay | Admitting: Internal Medicine

## 2008-04-16 ENCOUNTER — Ambulatory Visit: Payer: Self-pay | Admitting: Internal Medicine

## 2008-04-18 ENCOUNTER — Ambulatory Visit: Payer: Self-pay | Admitting: Internal Medicine

## 2008-04-23 ENCOUNTER — Ambulatory Visit: Payer: Self-pay | Admitting: Internal Medicine

## 2008-04-28 ENCOUNTER — Ambulatory Visit: Payer: Self-pay | Admitting: Internal Medicine

## 2008-05-05 ENCOUNTER — Encounter: Payer: Self-pay | Admitting: Internal Medicine

## 2008-05-07 ENCOUNTER — Ambulatory Visit: Payer: Self-pay | Admitting: Internal Medicine

## 2008-05-12 ENCOUNTER — Ambulatory Visit: Payer: Self-pay | Admitting: Internal Medicine

## 2008-05-19 ENCOUNTER — Ambulatory Visit: Payer: Self-pay | Admitting: Internal Medicine

## 2008-05-19 DIAGNOSIS — F1010 Alcohol abuse, uncomplicated: Secondary | ICD-10-CM

## 2008-05-19 HISTORY — DX: Alcohol abuse, uncomplicated: F10.10

## 2008-05-22 LAB — CONVERTED CEMR LAB
BUN: 14 mg/dL (ref 6–23)
Bilirubin, Direct: 0.2 mg/dL (ref 0.0–0.3)
CO2: 33 meq/L — ABNORMAL HIGH (ref 19–32)
Chloride: 105 meq/L (ref 96–112)
Cholesterol: 170 mg/dL (ref 0–200)
Creatinine, Ser: 1 mg/dL (ref 0.4–1.5)
Eosinophils Absolute: 0.1 10*3/uL (ref 0.0–0.7)
Eosinophils Relative: 2.9 % (ref 0.0–5.0)
GFR calc non Af Amer: 78.42 mL/min (ref >60)
Lymphs Abs: 1.5 10*3/uL (ref 0.7–4.0)
MCHC: 33.5 g/dL (ref 30.0–36.0)
MCV: 83.6 fL (ref 78.0–100.0)
Monocytes Absolute: 0.3 10*3/uL (ref 0.1–1.0)
Monocytes Relative: 8 % (ref 3.0–12.0)
Neutro Abs: 2.4 10*3/uL (ref 1.4–7.7)
Neutrophils Relative %: 54.4 % (ref 43.0–77.0)
Platelets: 232 10*3/uL (ref 150.0–400.0)
Potassium: 3.8 meq/L (ref 3.5–5.1)
RBC: 4.71 M/uL (ref 4.22–5.81)
Sodium: 144 meq/L (ref 135–145)
Total CHOL/HDL Ratio: 3
VLDL: 15.6 mg/dL (ref 0.0–40.0)

## 2008-05-26 ENCOUNTER — Encounter: Payer: Self-pay | Admitting: Internal Medicine

## 2008-06-04 ENCOUNTER — Ambulatory Visit: Payer: Self-pay | Admitting: Internal Medicine

## 2008-06-12 ENCOUNTER — Ambulatory Visit: Payer: Self-pay | Admitting: Internal Medicine

## 2008-06-24 ENCOUNTER — Telehealth: Payer: Self-pay | Admitting: Internal Medicine

## 2008-06-25 ENCOUNTER — Ambulatory Visit: Payer: Self-pay | Admitting: Internal Medicine

## 2008-06-26 ENCOUNTER — Ambulatory Visit: Payer: Self-pay | Admitting: Internal Medicine

## 2008-07-02 ENCOUNTER — Ambulatory Visit: Payer: Self-pay | Admitting: Internal Medicine

## 2008-07-09 ENCOUNTER — Ambulatory Visit: Payer: Self-pay | Admitting: Internal Medicine

## 2008-07-11 ENCOUNTER — Ambulatory Visit: Payer: Self-pay | Admitting: Internal Medicine

## 2008-07-14 ENCOUNTER — Ambulatory Visit: Payer: Self-pay | Admitting: Internal Medicine

## 2008-08-01 ENCOUNTER — Ambulatory Visit: Payer: Self-pay | Admitting: Internal Medicine

## 2008-08-06 ENCOUNTER — Ambulatory Visit: Payer: Self-pay | Admitting: Internal Medicine

## 2008-08-07 ENCOUNTER — Ambulatory Visit: Payer: Self-pay | Admitting: Internal Medicine

## 2008-08-11 ENCOUNTER — Ambulatory Visit: Payer: Self-pay | Admitting: Internal Medicine

## 2008-08-11 ENCOUNTER — Telehealth: Payer: Self-pay | Admitting: Internal Medicine

## 2008-08-20 ENCOUNTER — Ambulatory Visit: Payer: Self-pay | Admitting: Internal Medicine

## 2008-08-25 ENCOUNTER — Encounter: Payer: Self-pay | Admitting: Internal Medicine

## 2008-08-27 ENCOUNTER — Ambulatory Visit: Payer: Self-pay | Admitting: Internal Medicine

## 2008-09-01 ENCOUNTER — Encounter: Payer: Self-pay | Admitting: Internal Medicine

## 2008-09-01 ENCOUNTER — Encounter: Admission: RE | Admit: 2008-09-01 | Discharge: 2008-09-01 | Payer: Self-pay | Admitting: Orthopaedic Surgery

## 2008-09-17 ENCOUNTER — Ambulatory Visit: Payer: Self-pay | Admitting: Internal Medicine

## 2008-09-19 ENCOUNTER — Encounter: Payer: Self-pay | Admitting: Internal Medicine

## 2008-09-24 ENCOUNTER — Ambulatory Visit: Payer: Self-pay | Admitting: Internal Medicine

## 2008-09-25 ENCOUNTER — Ambulatory Visit: Payer: Self-pay | Admitting: Internal Medicine

## 2008-10-08 ENCOUNTER — Ambulatory Visit: Payer: Self-pay | Admitting: Internal Medicine

## 2008-10-13 ENCOUNTER — Encounter: Payer: Self-pay | Admitting: Internal Medicine

## 2008-10-15 ENCOUNTER — Ambulatory Visit: Payer: Self-pay | Admitting: Internal Medicine

## 2008-10-20 ENCOUNTER — Ambulatory Visit: Payer: Self-pay | Admitting: Internal Medicine

## 2008-10-27 ENCOUNTER — Ambulatory Visit: Payer: Self-pay | Admitting: Internal Medicine

## 2008-11-05 ENCOUNTER — Ambulatory Visit: Payer: Self-pay | Admitting: Internal Medicine

## 2008-11-10 ENCOUNTER — Ambulatory Visit: Payer: Self-pay | Admitting: Internal Medicine

## 2008-11-10 DIAGNOSIS — G4733 Obstructive sleep apnea (adult) (pediatric): Secondary | ICD-10-CM

## 2008-11-10 HISTORY — DX: Obstructive sleep apnea (adult) (pediatric): G47.33

## 2008-11-11 ENCOUNTER — Telehealth: Payer: Self-pay | Admitting: Internal Medicine

## 2008-11-21 ENCOUNTER — Ambulatory Visit: Payer: Self-pay | Admitting: Internal Medicine

## 2008-11-28 ENCOUNTER — Encounter: Payer: Self-pay | Admitting: Internal Medicine

## 2008-12-11 ENCOUNTER — Telehealth: Payer: Self-pay | Admitting: Internal Medicine

## 2008-12-22 ENCOUNTER — Ambulatory Visit: Payer: Self-pay | Admitting: Internal Medicine

## 2008-12-29 ENCOUNTER — Ambulatory Visit: Payer: Self-pay | Admitting: Internal Medicine

## 2008-12-31 LAB — CONVERTED CEMR LAB
Albumin: 4.1 g/dL (ref 3.5–5.2)
BUN: 10 mg/dL (ref 6–23)
Bilirubin, Direct: 0.2 mg/dL (ref 0.0–0.3)
CO2: 31 meq/L (ref 19–32)
GFR calc non Af Amer: 70.13 mL/min (ref >60)
Glucose, Bld: 97 mg/dL (ref 70–99)
HDL: 58.6 mg/dL (ref >39.00)
LDL Cholesterol: 91 mg/dL (ref 0–99)
Potassium: 3.4 meq/L — ABNORMAL LOW (ref 3.5–5.1)
Total Bilirubin: 1 mg/dL (ref 0.3–1.2)
Total CHOL/HDL Ratio: 3
Total Protein: 7 g/dL (ref 6.0–8.3)

## 2009-01-01 ENCOUNTER — Ambulatory Visit: Payer: Self-pay | Admitting: Internal Medicine

## 2009-01-15 ENCOUNTER — Ambulatory Visit: Payer: Self-pay | Admitting: Internal Medicine

## 2009-01-21 ENCOUNTER — Ambulatory Visit: Payer: Self-pay | Admitting: Internal Medicine

## 2009-01-29 ENCOUNTER — Ambulatory Visit: Payer: Self-pay | Admitting: Internal Medicine

## 2009-02-11 ENCOUNTER — Ambulatory Visit: Payer: Self-pay | Admitting: Internal Medicine

## 2009-02-11 DIAGNOSIS — G4700 Insomnia, unspecified: Secondary | ICD-10-CM | POA: Insufficient documentation

## 2009-02-11 HISTORY — DX: Insomnia, unspecified: G47.00

## 2009-02-19 ENCOUNTER — Ambulatory Visit: Payer: Self-pay | Admitting: Internal Medicine

## 2009-03-02 ENCOUNTER — Ambulatory Visit: Payer: Self-pay | Admitting: Internal Medicine

## 2009-03-05 ENCOUNTER — Encounter: Payer: Self-pay | Admitting: Internal Medicine

## 2009-03-16 ENCOUNTER — Ambulatory Visit: Payer: Self-pay | Admitting: Internal Medicine

## 2009-03-23 ENCOUNTER — Ambulatory Visit: Payer: Self-pay | Admitting: Internal Medicine

## 2009-03-24 ENCOUNTER — Ambulatory Visit: Payer: Self-pay | Admitting: Internal Medicine

## 2009-04-01 ENCOUNTER — Ambulatory Visit: Payer: Self-pay | Admitting: Internal Medicine

## 2009-04-06 ENCOUNTER — Ambulatory Visit: Payer: Self-pay | Admitting: Internal Medicine

## 2009-04-15 ENCOUNTER — Ambulatory Visit: Payer: Self-pay | Admitting: Internal Medicine

## 2009-04-15 ENCOUNTER — Telehealth: Payer: Self-pay | Admitting: Internal Medicine

## 2009-04-15 DIAGNOSIS — J309 Allergic rhinitis, unspecified: Secondary | ICD-10-CM

## 2009-04-15 DIAGNOSIS — J3089 Other allergic rhinitis: Secondary | ICD-10-CM

## 2009-04-15 DIAGNOSIS — J302 Other seasonal allergic rhinitis: Secondary | ICD-10-CM

## 2009-04-15 HISTORY — DX: Allergic rhinitis, unspecified: J30.9

## 2009-04-16 ENCOUNTER — Encounter: Payer: Self-pay | Admitting: Internal Medicine

## 2009-04-23 ENCOUNTER — Ambulatory Visit: Payer: Self-pay | Admitting: Internal Medicine

## 2009-04-29 ENCOUNTER — Ambulatory Visit: Payer: Self-pay | Admitting: Internal Medicine

## 2009-05-06 ENCOUNTER — Encounter: Payer: Self-pay | Admitting: Internal Medicine

## 2009-05-07 ENCOUNTER — Ambulatory Visit: Payer: Self-pay | Admitting: Internal Medicine

## 2009-05-08 ENCOUNTER — Encounter (INDEPENDENT_AMBULATORY_CARE_PROVIDER_SITE_OTHER): Payer: Self-pay | Admitting: *Deleted

## 2009-05-08 ENCOUNTER — Telehealth: Payer: Self-pay | Admitting: Internal Medicine

## 2009-05-08 ENCOUNTER — Inpatient Hospital Stay (HOSPITAL_COMMUNITY): Admission: EM | Admit: 2009-05-08 | Discharge: 2009-05-09 | Payer: Self-pay | Admitting: Emergency Medicine

## 2009-05-08 LAB — CONVERTED CEMR LAB
AST: 29 units/L (ref 0–37)
Basophils Absolute: 0 10*3/uL (ref 0.0–0.1)
Bilirubin, Direct: 0.1 mg/dL (ref 0.0–0.3)
CO2: 29 meq/L (ref 19–32)
Eosinophils Relative: 1.8 % (ref 0.0–5.0)
GFR calc non Af Amer: 78.2 mL/min (ref >60)
Glucose, Bld: 102 mg/dL — ABNORMAL HIGH (ref 70–99)
HCT: 44.6 % (ref 39.0–52.0)
Lymphocytes Relative: 12.3 % (ref 12.0–46.0)
MCHC: 34.7 g/dL (ref 30.0–36.0)
Monocytes Absolute: 0.3 10*3/uL (ref 0.1–1.0)
Monocytes Relative: 5.7 % (ref 3.0–12.0)
Neutro Abs: 4.4 10*3/uL (ref 1.4–7.7)
Neutrophils Relative %: 80.1 % — ABNORMAL HIGH (ref 43.0–77.0)
RDW: 14.2 % (ref 11.5–14.6)

## 2009-05-11 ENCOUNTER — Ambulatory Visit: Payer: Self-pay | Admitting: Internal Medicine

## 2009-05-11 ENCOUNTER — Telehealth: Payer: Self-pay | Admitting: Internal Medicine

## 2009-05-12 ENCOUNTER — Ambulatory Visit: Payer: Self-pay | Admitting: Internal Medicine

## 2009-05-12 ENCOUNTER — Encounter: Payer: Self-pay | Admitting: Internal Medicine

## 2009-05-13 ENCOUNTER — Ambulatory Visit: Payer: Self-pay | Admitting: Internal Medicine

## 2009-05-14 LAB — CONVERTED CEMR LAB
Calcium: 9 mg/dL (ref 8.4–10.5)
Chloride: 105 meq/L (ref 96–112)
Eosinophils Absolute: 0.1 10*3/uL (ref 0.0–0.7)
GFR calc non Af Amer: 78.2 mL/min (ref >60)
Glucose, Bld: 72 mg/dL (ref 70–99)
HCT: 37 % — ABNORMAL LOW (ref 39.0–52.0)
Lymphocytes Relative: 37 % (ref 12.0–46.0)
Lymphs Abs: 1.6 10*3/uL (ref 0.7–4.0)
MCHC: 33.9 g/dL (ref 30.0–36.0)
MCV: 86.6 fL (ref 78.0–100.0)
Neutro Abs: 2.2 10*3/uL (ref 1.4–7.7)
Neutrophils Relative %: 51.7 % (ref 43.0–77.0)
Potassium: 3.6 meq/L (ref 3.5–5.1)
RBC: 4.27 M/uL (ref 4.22–5.81)
RDW: 14.2 % (ref 11.5–14.6)
Sodium: 146 meq/L — ABNORMAL HIGH (ref 135–145)

## 2009-05-18 ENCOUNTER — Telehealth: Payer: Self-pay | Admitting: Internal Medicine

## 2009-05-18 ENCOUNTER — Ambulatory Visit: Payer: Self-pay | Admitting: Internal Medicine

## 2009-05-27 ENCOUNTER — Ambulatory Visit: Payer: Self-pay | Admitting: Internal Medicine

## 2009-05-28 ENCOUNTER — Encounter: Payer: Self-pay | Admitting: Internal Medicine

## 2009-06-02 ENCOUNTER — Telehealth: Payer: Self-pay | Admitting: Internal Medicine

## 2009-06-03 ENCOUNTER — Ambulatory Visit: Payer: Self-pay | Admitting: Internal Medicine

## 2009-06-09 ENCOUNTER — Ambulatory Visit: Payer: Self-pay | Admitting: Internal Medicine

## 2009-06-10 ENCOUNTER — Encounter: Payer: Self-pay | Admitting: Internal Medicine

## 2009-06-10 ENCOUNTER — Ambulatory Visit: Payer: Self-pay | Admitting: Internal Medicine

## 2009-06-17 ENCOUNTER — Telehealth: Payer: Self-pay | Admitting: Internal Medicine

## 2009-06-17 ENCOUNTER — Encounter: Payer: Self-pay | Admitting: Internal Medicine

## 2009-06-18 ENCOUNTER — Ambulatory Visit: Payer: Self-pay | Admitting: Internal Medicine

## 2009-06-24 ENCOUNTER — Ambulatory Visit: Payer: Self-pay | Admitting: Internal Medicine

## 2009-06-25 ENCOUNTER — Ambulatory Visit: Payer: Self-pay | Admitting: Internal Medicine

## 2009-07-08 ENCOUNTER — Ambulatory Visit: Payer: Self-pay | Admitting: Internal Medicine

## 2009-07-20 ENCOUNTER — Ambulatory Visit: Payer: Self-pay | Admitting: Internal Medicine

## 2009-08-10 ENCOUNTER — Ambulatory Visit: Payer: Self-pay | Admitting: Internal Medicine

## 2009-08-13 ENCOUNTER — Encounter: Payer: Self-pay | Admitting: Internal Medicine

## 2009-08-19 ENCOUNTER — Ambulatory Visit: Payer: Self-pay | Admitting: Internal Medicine

## 2009-08-26 ENCOUNTER — Ambulatory Visit: Payer: Self-pay | Admitting: Internal Medicine

## 2009-08-26 DIAGNOSIS — E291 Testicular hypofunction: Secondary | ICD-10-CM

## 2009-08-26 HISTORY — DX: Testicular hypofunction: E29.1

## 2009-08-27 ENCOUNTER — Ambulatory Visit: Payer: Self-pay | Admitting: Internal Medicine

## 2009-08-31 LAB — CONVERTED CEMR LAB
ALT: 14 units/L (ref 0–53)
Albumin: 3.8 g/dL (ref 3.5–5.2)
Basophils Relative: 0.9 % (ref 0.0–3.0)
CO2: 31 meq/L (ref 19–32)
Chloride: 96 meq/L (ref 96–112)
Creatinine, Ser: 1 mg/dL (ref 0.4–1.5)
Eosinophils Absolute: 0.1 10*3/uL (ref 0.0–0.7)
Lymphocytes Relative: 37.4 % (ref 12.0–46.0)
Lymphs Abs: 1.6 10*3/uL (ref 0.7–4.0)
Monocytes Relative: 10.5 % (ref 3.0–12.0)
Neutro Abs: 2.1 10*3/uL (ref 1.4–7.7)
Neutrophils Relative %: 48.2 % (ref 43.0–77.0)
Platelets: 253 10*3/uL (ref 150.0–400.0)
RBC: 4.51 M/uL (ref 4.22–5.81)
RDW: 13.4 % (ref 11.5–14.6)
Sodium: 137 meq/L (ref 135–145)
Testosterone: 324.32 ng/dL — ABNORMAL LOW (ref 350.00–890.00)
Total Protein: 6.4 g/dL (ref 6.0–8.3)
WBC: 4.3 10*3/uL — ABNORMAL LOW (ref 4.5–10.5)

## 2009-09-02 ENCOUNTER — Ambulatory Visit: Payer: Self-pay | Admitting: Internal Medicine

## 2009-09-03 ENCOUNTER — Ambulatory Visit: Payer: Self-pay | Admitting: Internal Medicine

## 2009-09-07 ENCOUNTER — Telehealth: Payer: Self-pay | Admitting: Internal Medicine

## 2009-09-13 ENCOUNTER — Encounter: Payer: Self-pay | Admitting: Internal Medicine

## 2009-09-14 ENCOUNTER — Ambulatory Visit: Payer: Self-pay | Admitting: Internal Medicine

## 2009-09-16 ENCOUNTER — Telehealth: Payer: Self-pay | Admitting: Internal Medicine

## 2009-09-29 ENCOUNTER — Ambulatory Visit: Payer: Self-pay | Admitting: Internal Medicine

## 2009-09-30 ENCOUNTER — Telehealth: Payer: Self-pay | Admitting: Internal Medicine

## 2009-10-01 ENCOUNTER — Ambulatory Visit: Payer: Self-pay | Admitting: Internal Medicine

## 2009-10-02 ENCOUNTER — Ambulatory Visit: Payer: Self-pay | Admitting: Internal Medicine

## 2009-10-02 ENCOUNTER — Telehealth: Payer: Self-pay | Admitting: Internal Medicine

## 2009-10-03 DIAGNOSIS — G4730 Sleep apnea, unspecified: Secondary | ICD-10-CM | POA: Insufficient documentation

## 2009-10-03 HISTORY — DX: Sleep apnea, unspecified: G47.30

## 2009-10-05 ENCOUNTER — Encounter: Payer: Self-pay | Admitting: Internal Medicine

## 2009-10-07 ENCOUNTER — Ambulatory Visit: Payer: Self-pay | Admitting: Internal Medicine

## 2009-10-26 ENCOUNTER — Ambulatory Visit: Payer: Self-pay | Admitting: Internal Medicine

## 2009-11-02 ENCOUNTER — Ambulatory Visit: Payer: Self-pay | Admitting: Internal Medicine

## 2009-11-09 ENCOUNTER — Ambulatory Visit: Payer: Self-pay | Admitting: Internal Medicine

## 2009-11-18 ENCOUNTER — Ambulatory Visit: Payer: Self-pay | Admitting: Internal Medicine

## 2009-12-02 ENCOUNTER — Ambulatory Visit: Payer: Self-pay | Admitting: Internal Medicine

## 2009-12-14 ENCOUNTER — Ambulatory Visit: Payer: Self-pay | Admitting: Internal Medicine

## 2010-01-06 ENCOUNTER — Ambulatory Visit: Payer: Self-pay | Admitting: Internal Medicine

## 2010-01-06 DIAGNOSIS — R109 Unspecified abdominal pain: Secondary | ICD-10-CM | POA: Insufficient documentation

## 2010-01-11 LAB — CONVERTED CEMR LAB
ALT: 21 units/L (ref 0–53)
AST: 28 units/L (ref 0–37)
Basophils Absolute: 0 10*3/uL (ref 0.0–0.1)
Basophils Relative: 0.8 % (ref 0.0–3.0)
Chloride: 100 meq/L (ref 96–112)
Eosinophils Absolute: 0.1 10*3/uL (ref 0.0–0.7)
Eosinophils Relative: 2.4 % (ref 0.0–5.0)
GFR calc non Af Amer: 81.82 mL/min (ref >60.00)
Glucose, Bld: 103 mg/dL — ABNORMAL HIGH (ref 70–99)
Hemoglobin: 13 g/dL (ref 13.0–17.0)
Lymphocytes Relative: 41.1 % (ref 12.0–46.0)
MCHC: 34.2 g/dL (ref 30.0–36.0)
MCV: 91.2 fL (ref 78.0–100.0)
Monocytes Relative: 11.4 % (ref 3.0–12.0)
Potassium: 3.1 meq/L — ABNORMAL LOW (ref 3.5–5.1)
RBC: 4.16 M/uL — ABNORMAL LOW (ref 4.22–5.81)
Sodium: 138 meq/L (ref 135–145)
Total Bilirubin: 0.6 mg/dL (ref 0.3–1.2)
WBC: 3.1 10*3/uL — ABNORMAL LOW (ref 4.5–10.5)

## 2010-01-13 ENCOUNTER — Ambulatory Visit: Payer: Self-pay | Admitting: Internal Medicine

## 2010-01-19 LAB — CONVERTED CEMR LAB
CO2: 33 meq/L — ABNORMAL HIGH (ref 19–32)
Calcium: 8.9 mg/dL (ref 8.4–10.5)
Chloride: 100 meq/L (ref 96–112)
Creatinine, Ser: 1.1 mg/dL (ref 0.4–1.5)
Glucose, Bld: 82 mg/dL (ref 70–99)
Potassium: 3.4 meq/L — ABNORMAL LOW (ref 3.5–5.1)

## 2010-02-15 ENCOUNTER — Encounter: Payer: Self-pay | Admitting: Internal Medicine

## 2010-02-16 ENCOUNTER — Ambulatory Visit: Payer: Self-pay | Admitting: Internal Medicine

## 2010-02-18 ENCOUNTER — Telehealth: Payer: Self-pay | Admitting: Internal Medicine

## 2010-02-21 ENCOUNTER — Ambulatory Visit: Payer: Self-pay | Admitting: Internal Medicine

## 2010-02-23 NOTE — Letter (Signed)
Summary: CPAP Supplies/[facility]  CPAP Supplies/[facility]   Imported By: Sherian Rein 09/21/2009 07:41:28  _____________________________________________________________________  External Attachment:    Type:   Image     Comment:   External Document

## 2010-02-23 NOTE — Procedures (Signed)
Summary: Upper Endoscopy  Patient: Corey [name] Note: All result statuses are Final unless otherwise noted.  Tests: (1) Upper Endoscopy (EGD)   EGD Upper Endoscopy       DONE     [facility]     [address]     [place], [place]  [address]           ENDOSCOPY PROCEDURE REPORT           PATIENT:  Corey, [name]  MR#:  [id number]     BIRTHDATE:  1935/04/25, 73 yrs. old  GENDER:  male           ENDOSCOPIST:  Hedwig Morton. Juanda Chance, MD     Referred by:           PROCEDURE DATE:  06/24/2009     PROCEDURE:  EGD with dilatation over guidewire     ASA CLASS:  Class II     INDICATIONS:  dysphagia hx benign es.stricture first dil.     1995,,1999,2001,,05/2004,           MEDICATIONS:   Versed 6 mg, Fentanyl 50 mcg     TOPICAL ANESTHETIC:  Exactacain Spray           DESCRIPTION OF PROCEDURE:   After the risks benefits and     alternatives of the procedure were thoroughly explained, informed     consent was obtained.  The [facility] GIF-H180 [id number] endoscope was     introduced through the mouth and advanced to the second portion of     the duodenum, without limitations.  The instrument was slowly     withdrawn as the mucosa was fully examined.     <<PROCEDUREIMAGES>>           A stricture was found. mild 13mm fibrous stricture, admitted the     scope Savary dilation over a guidewire 13,14,15,65mm savary dil     (see image1, image2, image3, and image10). passed without blood on     the dilator  Duodenitis was found (see image7 and image4). mild     patchy erythema  Otherwise the examination was normal (see image6,     image8, and image9). few fundic gland polyps    Retroflexed views     revealed no abnormalities.    The scope was then withdrawn from     the patient and the procedure completed.           COMPLICATIONS:  None           ENDOSCOPIC IMPRESSION:     1) Stricture     2) Duodenitis     3) Otherwise normal examination     s/p dilation to 31F with Savary dil  RECOMMENDATIONS:     1) Anti-reflux regimen to be follow     continue Pantoprazole 40 mg po qd     resume Plavix           REPEAT EXAM:  In 0 year(s) for.  redilate prn           ______________________________     Hedwig Morton. Juanda Chance, MD           CC:           n.     eSIGNED:   Hedwig Morton. Brodie at 06/24/2009 04:45 PM           Page 2 of 3   Corey, [name], [id number]  Note: An exclamation mark (!) indicates a result that was  not dispersed into the flowsheet. Document Creation Date: 06/24/2009 4:46 PM _______________________________________________________________________  (1) Order result status: Final Collection or observation date-time: 06/24/2009 16:35 Requested date-time:  Receipt date-time:  Reported date-time:  Referring Physician:   Ordering Physician: Lina Sar [id number]) Specimen Source:  Source: Launa Grill Order Number: [id number] Lab site:

## 2010-02-23 NOTE — Assessment & Plan Note (Signed)
Summary: CHEST CONGESTION/SOB/ MBW   Primary Provider/Referring Provider:  Swords  CC:  Pt here for follow up. Pt c/o choking when eating and drinking. Pt c/o cough, S.O.B, and and congestion.  History of Present Illness: 3/2/310- Asthma, rhinoconjunctivitis, CAD PAF 72 yoM with adult onset allergic complaints, skin test positive and on vaccine successfully x 30 years. Last skin testing 2000. He minimizes so we won't add more meds. Perennial throat cleariong/ morning hack. Rhinitis and hacking worse in past 4-5 years. He denies persistent dry cough or urticaria refferable to his Altace. Now 2-3 weeks increased sneeze, itching and watering eyes, sense that there is mucus to bring up from upper chest (scant), puffy eyes. Denies wheeze, purulent or bloody sputum. Walks outdoors.Denies ear presure or chest tightness/ wheeze now.  November 10, 2008- Asthma, rhinoconjunctivitis, CAD/ PAF, OSA Went to ER after last here, for exacerbation. Has had a number of night time exacerbations once or twice per week. Current episode started a week ago. sore throat. Longstandiung dx OSA. CPAP [facility]. He blames CPAP for "packing it in". Unknown pressure. He doesn't have a nebulizer machine. He doesn't seem to recognize GERD symptoms but we discussed obvious possibility with night time events.  January 29, 2009- Asthma, rhinoconjuctivitis, CAD/PAF, OSA Hacked and coughed last night unable to get clear. Concerned he stays hoarse and congested. An extra Advair dose last night seemd to help. He hasn't used his neb in months, speculating it might help. Not much wheeze- more  a sense of of throat clearing. Chokes easily now on food/ drink.   Current Medications (verified): 1)  Tambocor 100 Mg  Tabs (Flecainide Acetate) .... One By Mouth Bid 2)  Altace 2.5 Mg Tabs (Ramipril) .... Once Daily 3)  Norvasc 5 Mg  Tabs (Amlodipine Besylate) .... One-Half By Mouth Daily 4)  Hydrochlorothiazide 25 Mg  Tabs  (Hydrochlorothiazide) .... One-Half By Mouth Daily 5)  Pantoprazole Sodium 40 Mg Tbec (Pantoprazole Sodium) .... Take 1 Tablet By Mouth Every Morning. 6)  Plavix 75 Mg  Tabs (Clopidogrel Bisulfate) .... One By Mouth Daily 7)  Simvastatin 40 Mg Tabs (Simvastatin) .... Take 1 Tablet By Mouth At Bedtime 8)  Toprol Xl 25 Mg  Tb24 (Metoprolol Succinate) .... One-Half By Mouth Daily 9)  Aspirin Ec 81 Mg  Tbec (Aspirin) .... Take 1 Tablet By Mouth Once A Day 10)  Advair Diskus 500-50 Mcg/dose  Misc (Fluticasone-Salmeterol) .... Inhale 1 Puff Once Daily 11)  Albuterol 90 Mcg/act Aero Soln (Albuterol) .... 2 Puffs Every 4 Hours As Needed For Asthma 12)  Allergy Vaccine Gh 1:10 .... Once Weekly 13)  Accolate 20 Mg Tabs (Zafirlukast) .... Take 2 Tablet By Mouth Every Morning 14)  Citrucel  Powd (Methylcellulose (Laxative)) .... As Directed 15)  Xopenex 1.25 Mg/74ml Nebu (Levalbuterol Hcl) .Marland Kitchen.. 1 Neb Three Times A Day As Needed Asthma 16)  Nebulizer Machine .... As Directed For Asthma 17)  Cpap  "6.8" Advanced 18)  Promethazine-Codeine 6.25-10 Mg/42ml Syrp (Promethazine-Codeine) .... One Teaspoon Four Times A Day As Needed Cough  Allergies (verified): 1)  Sulfamethoxazole (Sulfamethoxazole) 2)  Neomycin Sulfate (Neomycin Sulfate)  Past History:  Past Medical History: Last updated: 05/19/2008 Current Problems:  DIVERTICULOSIS, COLON (ICD-562.10) Hx of COLITIS, ACUTE (ICD-558.9) COLONIC POLYPS, ADENOMATOUS, HX OF (ICD-V12.72) GERD (ICD-530.81) HYPERLIPIDEMIA (ICD-272.4) ESOPHAGEAL STRICTURE (ICD-530.3) UNSPECIFIED HEMORRHAGE OF GASTROINTESTINAL TRACT (ICD-578.9) BACK PAIN (ICD-724.5) UPPER RESPIRATORY INFECTION (ICD-465.9) ALCOHOL ABUSE (ICD-305.00) MRI, BRAIN, ABNORMAL (ICD-794.09) HEADACHE (ICD-784.0) alcohol UNSPECIFIED DENTOFACIAL ANOMALIES (ICD-524.9) HAND PAIN (ICD-729.5) ALLERGIC  CONJUNCTIVITIS (ICD-372.14) ALLERGIC RHINITIS (ICD-477.9) FAMILY HISTORY OF ALCOHOLISM/ADDICTION  (ICD-V61.41) PHYSICAL EXAMINATION (ICD-V70.0) TRANSIENT ISCHEMIC ATTACKS, HX OF (ICD-V12.50) HYPERGLYCEMIA (ICD-790.29) MYOCARDIAL INFARCTION, HX OF (ICD-412) HYPERTENSION (ICD-401.9) CORONARY ARTERY DISEASE (ICD-414.00) PROSTATE CANCER, HX OF (ICD-V10.46) ATRIAL FIBRILLATION (ICD-427.31) ASTHMA (ICD-493.90) HIP PAIN, LEFT (ICD-719.45) SHOULDER PAIN, LEFT (ICD-719.41)  Past Surgical History: Last updated: 04/15/2008 UPPP Tonsillectomy,adenoidectomy knee surgery X 3--arthroscopy Prostatectomy  02/02, prostate ca  Family History: Last updated: 02/14/2008 Family History of Alcoholism/Addiction Family History of Heart Disease: Brother, Father  Social History: Last updated: 05/19/2008 Occupation: Museum/gallery conservator paper and Engineer, petroleum Married Alcohol use-yes Regular exercise-yes---walking regularly (2010) Patient is a former smoker.  Illicit Drug Use - no Patient gets regular exercise.  Risk Factors: Alcohol Use: 4+ (05/30/2007) Exercise: yes (02/18/2008)  Risk Factors: Smoking Status: quit > 6 months (12/29/2008)  Review of Systems      See HPI       The patient complains of dyspnea on exertion and prolonged cough.  The patient denies anorexia, fever, weight loss, weight gain, vision loss, decreased hearing, hoarseness, chest pain, syncope, peripheral edema, headaches, hemoptysis, abdominal pain, and severe indigestion/heartburn.    Vital Signs:  Patient profile:   75 year old male Height:      73 inches Weight:      196.13 pounds O2 Sat:      94 % on Room air Pulse rate:   49 / minute BP sitting:   122 / 66  (left arm) Cuff size:   regular  Vitals Entered By: Zackery Barefoot CMA (January 29, 2009 3:56 PM)  O2 Flow:  Room air CC: Pt here for follow up. Pt c/o choking when eating and drinking. Pt c/o cough, S.O.B,  and congestion Comments Medications reviewed with patient Zackery Barefoot CMA  January 29, 2009 3:57 PM    Physical Exam  Additional Exam:   General: A/Ox3; pleasant and cooperative, NAD,  SKIN: no rash, lesions NODES: no lymphadenopathy HEENT: [place]/AT, EOM- WNL, Conjuctivae- clear, PERRLA, marked periorbital edema, TM-WNL, Nose- mucus, sniffing and blowing Throat- S/p UPPP, pale with no postnasal drip NECK: Supple w/ fair ROM, JVD- none, normal carotid impulses w/o bruits Thyroid- CHEST: Clear to P&A HEART: RRR, no m/g/r heard ABDOMEN: Soft and nl;  EXB:MWUX, nl pulses, no edema  NEURO: Grossly intact to observation, trmulous      Impression & Recommendations:  Problem # 1:  ASTHMA (ICD-493.90) I am not impressed there is much bronchospasm now. He may be getting pharyngeal irritation from the dry Advair powder.  We also discussed ACE throat/ upper airway problems and he may need to ask Dr Dorothea Glassman for a substitution. Also consider swallowing eval/ MBS. we discussed reflux precautions.  Problem # 2:  OBSTRUCTIVE SLEEP APNEA (ICD-327.23) He consides CPAP "life saving", but also suspects it may bother his throat at times, despite the humidifier. He is s/p UPPP.  Medications Added to Medication List This Visit: 1)  Allergy Vaccine Gh 1:10  .... Once weekly  Other Orders: Est. Patient Level II (32440)  Patient Instructions: 1)  Please schedule a follow-up appointment in 4 months. 2)  Try sample Dulera 200/5, 2 puffs and rinse twice daily. This is a 3)  substitute for Advair. If it solves the throat irritation problem, then call for a script.

## 2010-02-23 NOTE — Assessment & Plan Note (Signed)
Summary: 4 months/apc   Copy to:  n/a Primary Provider/Referring Provider:  Swords  CC:  follow up visit-asthma and allergies..  History of Present Illness:  November 10, 2008- Chronic bronchitis/ bronchiectasis, hx sinusitis Had uncomplicated shoulder surgery.Nasal congestion with cooler Fall weather. Allegra-D is sufficient. Does her nebs 2-3 x daily. Mostly nasal drainage grey.Denies headache, earache or fever. Flu vax talk. She has been more stable since she retired from OR nurse duty- always questioned role of cool OR air.  May 11, 2009- Chronic bronchitis/ bronchiectasis, hx sinusitis Just hosp for renal insufficiency-complicating dehydration from diverticulitis.Marland Kitchen He had no breathing problems. Breathing is at baseline. Prior, Dr Jenne Pane had seen him for me (ENT) and gave prednisone. Aware of throat clearing. We discussed dry powder inhalers, reflux, asthma He has tried both [place] and Symbicort. He likes using Proair rescue inhaler.  ............................................................................................... Watch for ACEI cough  September 03, 2009- Chronic bronchitis/ bronchiectasis, hx sinsuitis He has noted some persistent congestion, blaming the summer air quality. He has been able to walk little outside. We discussed potential for ACEinhibitor to be part of his chronic cough and I suggested a trial of an ARB instead. He thinks Dr Alanda Amass was the one who wrote for it and will see him in a month.  He has limped along with rare use of nasonex and proair samples. Dr Jenne Pane got him off the otc nasal decongestant sprays.   Asthma History    Initial Asthma Severity Rating:    Age range: 12+ years    Symptoms: 0-2 days/week    Nighttime Awakenings: 0-2/month    Interferes w/ normal activity: no limitations    SABA use (not for EIB): 0-2 days/week    Asthma Severity Assessment: Intermittent   Preventive Screening-Counseling & Management  Alcohol-Tobacco  Alcohol drinks/day: 4+     Smoking Status: quit > 6 months     Year Started: 1960     Year Quit: 1966     Pack years: 6 years  1/2 pack  Current Medications (verified): 1)  Tambocor 50 Mg Tabs (Flecainide Acetate) .... Take 1 By Mouth  Two Times A Day 2)  Altace 2.5 Mg Tabs (Ramipril) .... Once Daily 3)  Norvasc 5 Mg  Tabs (Amlodipine Besylate) .... One-Half By Mouth Daily 4)  Pantoprazole Sodium 40 Mg Tbec (Pantoprazole Sodium) .... Take 1 Tablet By Mouth Once A Day 5)  Plavix 75 Mg  Tabs (Clopidogrel Bisulfate) .... One By Mouth Daily 6)  Simvastatin 40 Mg Tabs (Simvastatin) .... Take 1 Tablet By Mouth At Bedtime 7)  Toprol Xl 25 Mg  Tb24 (Metoprolol Succinate) .... One-Half By Mouth Daily 8)  Aspirin Ec 81 Mg  Tbec (Aspirin) .... Take 1 Tablet By Mouth Once A Day 9)  Advair Diskus 500-50 Mcg/dose  Misc (Fluticasone-Salmeterol) .... Inhale 1 Puff Once Daily 10)  Proair Hfa 108 (90 Base) Mcg/act Aers (Albuterol Sulfate) .... 2 Puffs Four Times A Day As Needed Rescue Inhaler 11)  Allergy Vaccine Gh 1:10 .... Once Weekly 12)  Accolate 20 Mg Tabs (Zafirlukast) .... Take 2 Tablet By Mouth Every Morning 13)  Citrucel  Powd (Methylcellulose (Laxative)) .... As Directed 14)  Xopenex 1.25 Mg/3ml Nebu (Levalbuterol Hcl) .Marland Kitchen.. 1 Neb Three Times A Day As Needed Asthma 15)  Nebulizer Machine .... As Directed For Asthma 16)  Cpap  "6.8" Advanced 17)  Zolpidem Tartrate 5 Mg  Tabs (Zolpidem Tartrate) .... 1/2-1 By Mouth At Night As Needed 18)  Nasal Relief 0.05 % Soln (  Oxymetazoline Hcl) .... Uad 19)  Fluticasone Propionate 50 Mcg/act Susp (Fluticasone Propionate) .... 2 Sprays Each Nostril Once Every Day  Allergies (verified): 1)  Sulfamethoxazole (Sulfamethoxazole)  Past History:  Past Medical History: Last updated: 05/12/2009 DIVERTICULOSIS, COLON (ICD-562.10) Hx of COLITIS, ACUTE (ICD-558.9) COLONIC POLYPS, ADENOMATOUS, HX OF (ICD-V12.72) GERD (ICD-530.81) HYPERLIPIDEMIA  (ICD-272.4) ESOPHAGEAL STRICTURE (ICD-530.3) UNSPECIFIED HEMORRHAGE OF GASTROINTESTINAL TRACT (ICD-578.9) BACK PAIN (ICD-724.5) UPPER RESPIRATORY INFECTION (ICD-465.9) ALCOHOL ABUSE (ICD-305.00) MRI, BRAIN, ABNORMAL (ICD-794.09) HEADACHE (ICD-784.0) UNSPECIFIED DENTOFACIAL ANOMALIES (ICD-524.9) HAND PAIN (ICD-729.5) ALLERGIC CONJUNCTIVITIS (ICD-372.14) ALLERGIC RHINITIS (ICD-477.9) FAMILY HISTORY OF ALCOHOLISM/ADDICTION (ICD-V61.41) PHYSICAL EXAMINATION (ICD-V70.0) TRANSIENT ISCHEMIC ATTACKS, HX OF (ICD-V12.50) HYPERGLYCEMIA (ICD-790.29) MYOCARDIAL INFARCTION, HX OF (ICD-412) HYPERTENSION (ICD-401.9) CORONARY ARTERY DISEASE (ICD-414.00) PROSTATE CANCER, HX OF (ICD-V10.46) ATRIAL FIBRILLATION (ICD-427.31) ASTHMA (ICD-493.90) HIP PAIN, LEFT (ICD-719.45) SHOULDER PAIN, LEFT (ICD-719.41)  Past Surgical History: Last updated: 05/12/2009 UPPP Tonsillectomy,adenoidectomy knee surgery X 3--arthroscopy-bilateral (2 surgeries-right, 1 surgery-left) Prostatectomy  02/02, prostate ca  Family History: Last updated: 02/14/2008 Family History of Alcoholism/Addiction Family History of Heart Disease: Brother, Father  Social History: Last updated: 05/12/2009 Occupation: Museum/gallery conservator paper and Engineer, petroleum Married Alcohol use-yes-currently on hold Regular exercise-yes---walking regularly (2010) Patient is a former smoker. -stopped age 74 Illicit Drug Use - no  Risk Factors: Alcohol Use: 4+ (09/03/2009) Exercise: yes (02/18/2008)  Risk Factors: Smoking Status: quit > 6 months (09/03/2009)  Review of Systems      See HPI       The patient complains of shortness of breath with activity, nasal congestion/difficulty breathing through nose, and sneezing.  The patient denies shortness of breath at rest, productive cough, non-productive cough, coughing up blood, chest pain, irregular heartbeats, acid heartburn, indigestion, loss of appetite, weight change, abdominal pain, difficulty  swallowing, sore throat, tooth/dental problems, headaches, anxiety, change in color of mucus, and fever.    Vital Signs:  Patient profile:   75 year old male Height:      73 inches Weight:      184.13 pounds BMI:     24.38 O2 Sat:      95 % on Room air Pulse rate:   49 / minute BP sitting:   142 / 82  (right arm) Cuff size:   regular  Vitals Entered By: Reynaldo Minium CMA (September 03, 2009 11:06 AM)  O2 Flow:  Room air CC: follow up visit-asthma and allergies.   Physical Exam  Additional Exam:  General: A/Ox3; pleasant and cooperative, NAD, tired/ depressed -appearing NODES: no lymphadenopathy HEENT: [place]/AT, EOM- WNL, Conjuctivae- clear, PERRLA, less  periorbital edema, TM-WNL, Nose-  mucosa is pale, not edematous.   Throat-  pale with no postnasal drip, S/P UPPP NECK: Supple w/ fair ROM, JVD- none, normal carotid impulses w/o bruits Thyroid- CHEST: Clear to P&A HEART: RRR, no m/g/r heard- regular pulse today ABDOMEN: Soft and nl;  ZOX:WRUE, nl pulses, no edema  NEURO: Grossly intact to observation, tremulous      Impression & Recommendations:  Problem # 1:  ALLERGIC RHINITIS (ICD-477.9) We compared allergic and nonalergic rhinitis, effect of air quality and expected response to available meds. Needs refill on Nasonex. His updated medication list for this problem includes:    Nasal Relief 0.05 % Soln (Oxymetazoline hcl) ..... Uad    Fluticasone Propionate 50 Mcg/act Susp (Fluticasone propionate) .Marland Kitchen... 2 sprays each nostril once every day    Nasonex 50 Mcg/act Susp (Mometasone furoate) .Marland Kitchen... 1-2 puffs each nostril once daily  Problem # 2:  ASTHMA (ICD-493.90) I asked him to try  Benicar as an ARB for long enough to see if his ACE is contributing to cough. We will give refill for his rescue inhaler.  Problem # 3:  OBSTRUCTIVE SLEEP APNEA (ICD-327.23) He has maintained his weight near normal and has had UPPP surgery. He remains compliant with CPAP but it may be appropriate  to reassess him without it to see if he still needs it. We will discuss this more going forward.  Medications Added to Medication List This Visit: 1)  Nasonex 50 Mcg/act Susp (Mometasone furoate) .Marland Kitchen.. 1-2 puffs each nostril once daily 2)  Proair Hfa 108 (90 Base) Mcg/act Aers (Albuterol sulfate) .... 2 puffs four times a day as needed rescue inhaler  Other Orders: Est. Patient Level IV (16109)  Patient Instructions: 1)  Please schedule a follow-up appointment in 6 months. 2)  Scripts for proair and Nasonex 3)  Try Benicar 1/2 x 20 mg tabs, once daily, to try instead of altace/ ACE inhibitor. We are looking to see if this change results in less cough and throat irritation. Let Dr Steva Ready your experience so he can decide what to do long term. Prescriptions: PROAIR HFA 108 (90 BASE) MCG/ACT AERS (ALBUTEROL SULFATE) 2 puffs four times a day as needed rescue inhaler  #1 x prn   Entered and Authorized by:   Waymon Budge MD   Signed by:   Waymon Budge MD on 09/03/2009   Method used:   Print then Give to Patient   RxID:   [id number] NASONEX 50 MCG/ACT SUSP (MOMETASONE FUROATE) 1-2 puffs each nostril once daily  #1 x prn   Entered and Authorized by:   Waymon Budge MD   Signed by:   Waymon Budge MD on 09/03/2009   Method used:   Print then Give to Patient   RxID:   [id number]

## 2010-02-23 NOTE — Letter (Signed)
Summary: [facility]  [facility]   Imported By: Lester Ravenswood 05/12/2009 11:33:46  _____________________________________________________________________  External Attachment:    Type:   Image     Comment:   External Document

## 2010-02-23 NOTE — Letter (Signed)
Summary: [facility]  [facility]   Imported By: Maryln Gottron 11/19/2009 09:41:12  _____________________________________________________________________  External Attachment:    Type:   Image     Comment:   External Document

## 2010-02-23 NOTE — Assessment & Plan Note (Signed)
Summary: 2 wk rov/mm   Vital Signs:  Patient profile:   75 year old male Weight:      182 pounds BMI:     24.10 Temp:     97.6 degrees F oral Pulse rate:   52 / minute Pulse rhythm:   regular Resp:     12 per minute BP sitting:   122 / 70  (left arm) Cuff size:   regular  Vitals Entered By: Gladis Riffle, RN (May 27, 2009 10:40 AM) CC: 2 week rov--states not bouncing back like he thought he would Is Patient Diabetic? No   Primary Care Provider:  Tamra Koos  CC:  2 week rov--states not bouncing back like he thought he would.  History of Present Illness: f/u  Follow-Up Visit      This is a 75 year old man who presents for Follow-up visit.  The patient denies chest pain and palpitations.  Since the last visit the patient notes no new problems or concerns.  The patient reports taking meds as prescribed.  When questioned about possible medication side effects, the patient notes none.  says he is 75% back to normal..."just more fatigued"  All other systems reviewed and were negative   Preventive Screening-Counseling & Management  Alcohol-Tobacco     Alcohol drinks/day: 4+     Smoking Status: quit > 6 months     Year Started: 1960     Year Quit: 1966     Pack years: 6 years  1/2 pack  Current Problems (verified): 1)  Allergic Rhinitis  (ICD-477.9) 2)  Asthma  (ICD-493.90) 3)  Insomnia-sleep Disorder-unspec  (ICD-780.52) 4)  Obstructive Sleep Apnea  (ICD-327.23) 5)  Alcohol Abuse, Episodic  (ICD-305.02) 6)  Diverticulosis, Colon  (ICD-562.10) 7)  Colonic Polyps, Adenomatous, Hx of  (ICD-V12.72) 8)  Gerd  (ICD-530.81) 9)  Hyperlipidemia  (ICD-272.4) 10)  Esophageal Stricture  (ICD-530.3) 11)  Transient Ischemic Attacks, Hx of  (ICD-V12.50) 12)  Hyperglycemia  (ICD-790.29) 13)  Myocardial Infarction, Hx of  (ICD-412) 14)  Hypertension  (ICD-401.9) 15)  Coronary Artery Disease  (ICD-414.00) 16)  Prostate Cancer, Hx of  (ICD-V10.46) 17)  Atrial Fibrillation   (ICD-427.31)  Current Medications (verified): 1)  Tambocor 50 Mg Tabs (Flecainide Acetate) .... Take 1 By Mouth  Two Times A Day 2)  Altace 2.5 Mg Tabs (Ramipril) .... Once Daily 3)  Norvasc 5 Mg  Tabs (Amlodipine Besylate) .... One-Half By Mouth Daily 4)  Pantoprazole Sodium 40 Mg Tbec (Pantoprazole Sodium) .... Take 1 Tablet By Mouth Once A Day 5)  Plavix 75 Mg  Tabs (Clopidogrel Bisulfate) .... One By Mouth Daily 6)  Simvastatin 40 Mg Tabs (Simvastatin) .... Take 1 Tablet By Mouth At Bedtime 7)  Toprol Xl 25 Mg  Tb24 (Metoprolol Succinate) .... One-Half By Mouth Daily 8)  Aspirin Ec 81 Mg  Tbec (Aspirin) .... Take 1 Tablet By Mouth Once A Day Currently On Hold 9)  Advair Diskus 500-50 Mcg/dose  Misc (Fluticasone-Salmeterol) .... Inhale 1 Puff Once Daily 10)  Proair Hfa 108 (90 Base) Mcg/act Aers (Albuterol Sulfate) .... 2 Puffs Four Times A Day As Needed Rescue Inhaler 11)  Allergy Vaccine Gh 1:10 .... Once Weekly 12)  Accolate 20 Mg Tabs (Zafirlukast) .... Take 2 Tablet By Mouth Every Morning 13)  Citrucel  Powd (Methylcellulose (Laxative)) .... As Directed 14)  Xopenex 1.25 Mg/12ml Nebu (Levalbuterol Hcl) .Marland Kitchen.. 1 Neb Three Times A Day As Needed Asthma 15)  Nebulizer Machine .... As Directed  For Asthma 16)  Cpap  "6.8" [facility] 17)  Zolpidem Tartrate 5 Mg  Tabs (Zolpidem Tartrate) .... 1/2-1 By Mouth At Night As Needed 18)  Nasal Relief 0.05 % Soln (Oxymetazoline Hcl) .... Uad 19)  Fluticasone Propionate 50 Mcg/act Susp (Fluticasone Propionate) .... 2 Sprays Each Nostril Once Every Day  Allergies: 1)  Sulfamethoxazole (Sulfamethoxazole)  Past History:  Past Medical History: Last updated: 05/12/2009 DIVERTICULOSIS, COLON (ICD-562.10) Hx of COLITIS, ACUTE (ICD-558.9) COLONIC POLYPS, ADENOMATOUS, HX OF (ICD-V12.72) GERD (ICD-530.81) HYPERLIPIDEMIA (ICD-272.4) ESOPHAGEAL STRICTURE (ICD-530.3) UNSPECIFIED HEMORRHAGE OF GASTROINTESTINAL TRACT (ICD-578.9) BACK PAIN (ICD-724.5) UPPER  RESPIRATORY INFECTION (ICD-465.9) ALCOHOL ABUSE (ICD-305.00) MRI, BRAIN, ABNORMAL (ICD-794.09) HEADACHE (ICD-784.0) UNSPECIFIED DENTOFACIAL ANOMALIES (ICD-524.9) HAND PAIN (ICD-729.5) ALLERGIC CONJUNCTIVITIS (ICD-372.14) ALLERGIC RHINITIS (ICD-477.9) FAMILY HISTORY OF ALCOHOLISM/ADDICTION (ICD-V61.41) PHYSICAL EXAMINATION (ICD-V70.0) TRANSIENT ISCHEMIC ATTACKS, HX OF (ICD-V12.50) HYPERGLYCEMIA (ICD-790.29) MYOCARDIAL INFARCTION, HX OF (ICD-412) HYPERTENSION (ICD-401.9) CORONARY ARTERY DISEASE (ICD-414.00) PROSTATE CANCER, HX OF (ICD-V10.46) ATRIAL FIBRILLATION (ICD-427.31) ASTHMA (ICD-493.90) HIP PAIN, LEFT (ICD-719.45) SHOULDER PAIN, LEFT (ICD-719.41)  Past Surgical History: Last updated: 05/12/2009 UPPP Tonsillectomy,adenoidectomy knee surgery X 3--arthroscopy-bilateral (2 surgeries-right, 1 surgery-left) Prostatectomy  02/02, prostate ca  Family History: Last updated: 02/14/2008 Family History of Alcoholism/Addiction Family History of Heart Disease: Brother, Father  Social History: Last updated: 05/12/2009 Occupation: Museum/gallery conservator paper and Engineer, petroleum Married Alcohol use-yes-currently on hold Regular exercise-yes---walking regularly (2010) Patient is a former smoker. -stopped age 80 Illicit Drug Use - no  Risk Factors: Alcohol Use: 4+ (05/27/2009) Exercise: yes (02/18/2008)  Risk Factors: Smoking Status: quit > 6 months (05/27/2009)  Physical Exam  General:  alert and well-developed.   Head:  normocephalic and atraumatic.   Eyes:  pupils equal and pupils round.   Abdomen:  soft and non-tender.     Impression & Recommendations:  Problem # 1:  DIVERTICULOSIS, COLON (ICD-562.10) recent gastroenteritis---gradually improving he is back to work eating normally energy level will gradually improve  note right renal abnormality---5 mm abnormality  Complete Medication List: 1)  Tambocor 50 Mg Tabs (Flecainide acetate) .... Take 1 by mouth  two times a  day 2)  Altace 2.5 Mg Tabs (Ramipril) .... Once daily 3)  Norvasc 5 Mg Tabs (Amlodipine besylate) .... One-half by mouth daily 4)  Pantoprazole Sodium 40 Mg Tbec (Pantoprazole sodium) .... Take 1 tablet by mouth once a day 5)  Plavix 75 Mg Tabs (Clopidogrel bisulfate) .... One by mouth daily 6)  Simvastatin 40 Mg Tabs (Simvastatin) .... Take 1 tablet by mouth at bedtime 7)  Toprol Xl 25 Mg Tb24 (Metoprolol succinate) .... One-half by mouth daily 8)  Aspirin Ec 81 Mg Tbec (Aspirin) .... Take 1 tablet by mouth once a day currently on hold 9)  Advair Diskus 500-50 Mcg/dose Misc (Fluticasone-salmeterol) .... Inhale 1 puff once daily 10)  Proair Hfa 108 (90 Base) Mcg/act Aers (Albuterol sulfate) .... 2 puffs four times a day as needed rescue inhaler 11)  Allergy Vaccine Gh 1:10  .... Once weekly 12)  Accolate 20 Mg Tabs (Zafirlukast) .... Take 2 tablet by mouth every morning 13)  Citrucel Powd (Methylcellulose (laxative)) .... As directed 14)  Xopenex 1.25 Mg/63ml Nebu (Levalbuterol hcl) .Marland Kitchen.. 1 neb three times a day as needed asthma 15)  Nebulizer Machine  .... As directed for asthma 16)  Cpap "6.8" [facility]  17)  Zolpidem Tartrate 5 Mg Tabs (Zolpidem tartrate) .... 1/2-1 by mouth at night as needed 18)  Nasal Relief 0.05 % Soln (Oxymetazoline hcl) .... Uad 19)  Fluticasone Propionate 50 Mcg/act Susp (Fluticasone  propionate) .... 2 sprays each nostril once every day  Patient Instructions: 1)  see me 6 weeks   CT Abdomen/Pelvis  Procedure date:  05/08/2009  Findings:      5mm right renal uncharacterized hypodensity

## 2010-02-23 NOTE — Letter (Signed)
Summary: [facility]  [facility]   Imported By: Sherian Rein 05/21/2009 11:18:52  _____________________________________________________________________  External Attachment:    Type:   Image     Comment:   External Document

## 2010-02-23 NOTE — Letter (Signed)
Summary: Anticoagulation Modification Letter  [facility]  [address] [place], [place] [address]   Phone: [phone]  Fax: [phone]    Jun 10, 2009  Re:    Corey [name] DOB:    25-Oct-1935 MRN:    [id number]    Dear Dr Alanda Amass:  We have scheduled the above patient for an endoscopic procedure. Our records show that  he/she is on anticoagulation therapy. Please advise as to how long the patient may come off their therapy of Plavix prior to the scheduled procedure(s) on 06/24/09.   Please fax back/or route the completed form to Dottie at [phone].  Thank you for your help with this matter.  Sincerely,  Dottie Nelson-Smith CMA [facility])   Physician Recommendation:  Hold Plavix 7 days prior ________________  Other ______________________________

## 2010-02-23 NOTE — Assessment & Plan Note (Signed)
Summary: 1 year f/u/ mbw   Primary Provider/Referring Provider:  Swords  CC:  Follow up. states breathing is doing fine. c/o itchy, watery eyes, runny nose, and nasal and head congestion x 1-1/2 months.  states he will occ get some clear mucus out.  states he is not able to use cpap effectively due to the nasal congestion.  states dulera did not help with throat irritation so he went back to advair.  Corey [name]  History of Present Illness:  November 10, 2008- Asthma, rhinoconjunctivitis, CAD/ PAF, OSA Went to ER after last here, for exacerbation. Has had a number of night time exacerbations once or twice per week. Current episode started a week ago. sore throat. Longstandiung dx OSA. CPAP [facility]. He blames CPAP for "packing it in". Unknown pressure. He doesn't have a nebulizer machine. He doesn't seem to recognize GERD symptoms but we discussed obvious possibility with night time events.  January 29, 2009- Asthma, rhinoconjuctivitis, CAD/PAF, OSA Hacked and coughed last night unable to get clear. Concerned he stays hoarse and congested. An extra Advair dose last night seemd to help. He hasn't used his neb in months, speculating it might help. Not much wheeze- more  a sense of of throat clearing. Chokes easily now on food/ drink.  April 15, 2009 Asthma, rhinoconjunctivitis, CAD/ PAF, OSA...............Corey Kitchenwife here Nasal congestion, blowing nose all night long so he can't use CPAP. Ambien helps him sleep some. Using oxymetolazone spray. Wife is concerned, saying he doesn't sleep and is tired. He says CPAP is set on 7 through [facility] after last pressure adjustment to 11 was too much . They never gave him a humidifier as ordered.    Current Medications (verified): 1)  Tambocor 100 Mg  Tabs (Flecainide Acetate) .... One By Mouth Bid 2)  Altace 2.5 Mg Tabs (Ramipril) .... Once Daily 3)  Norvasc 5 Mg  Tabs (Amlodipine Besylate) .... One-Half By Mouth Daily 4)  Hydrochlorothiazide 25 Mg  Tabs  (Hydrochlorothiazide) .... One-Half By Mouth Daily 5)  Pantoprazole Sodium 40 Mg Tbec (Pantoprazole Sodium) .... Take 1 Tablet By Mouth Every Morning. 6)  Plavix 75 Mg  Tabs (Clopidogrel Bisulfate) .... One By Mouth Daily 7)  Simvastatin 40 Mg Tabs (Simvastatin) .... Take 1 Tablet By Mouth At Bedtime 8)  Toprol Xl 25 Mg  Tb24 (Metoprolol Succinate) .... One-Half By Mouth Daily 9)  Aspirin Ec 81 Mg  Tbec (Aspirin) .... Take 1 Tablet By Mouth Once A Day 10)  Advair Diskus 500-50 Mcg/dose  Misc (Fluticasone-Salmeterol) .... Inhale 1 Puff Once Daily 11)  Albuterol 90 Mcg/act Aero Soln (Albuterol) .... 2 Puffs Every 4 Hours As Needed For Asthma 12)  Allergy Vaccine Gh 1:10 .... Once Weekly 13)  Accolate 20 Mg Tabs (Zafirlukast) .... Take 2 Tablet By Mouth Every Morning 14)  Citrucel  Powd (Methylcellulose (Laxative)) .... As Directed 15)  Xopenex 1.25 Mg/36ml Nebu (Levalbuterol Hcl) .Corey [name].. 1 Neb Three Times A Day As Needed Asthma 16)  Nebulizer Machine .... As Directed For Asthma 17)  Cpap  "6.8" Advanced 18)  Zolpidem Tartrate 5 Mg  Tabs (Zolpidem Tartrate) .... 1/2-1 By Mouth At Night As Needed 19)  Nasal Relief 0.05 % Soln (Oxymetazoline Hcl) .... Uad  Allergies (verified): 1)  Sulfamethoxazole (Sulfamethoxazole) 2)  Neomycin Sulfate (Neomycin Sulfate)  Past History:  Past Medical History: Last updated: 05/19/2008 Current Problems:  DIVERTICULOSIS, COLON (ICD-562.10) Hx of COLITIS, ACUTE (ICD-558.9) COLONIC POLYPS, ADENOMATOUS, HX OF (ICD-V12.72) GERD (ICD-530.81) HYPERLIPIDEMIA (ICD-272.4) ESOPHAGEAL STRICTURE (ICD-530.3) UNSPECIFIED HEMORRHAGE  OF GASTROINTESTINAL TRACT (ICD-578.9) BACK PAIN (ICD-724.5) UPPER RESPIRATORY INFECTION (ICD-465.9) ALCOHOL ABUSE (ICD-305.00) MRI, BRAIN, ABNORMAL (ICD-794.09) HEADACHE (ICD-784.0) alcohol UNSPECIFIED DENTOFACIAL ANOMALIES (ICD-524.9) HAND PAIN (ICD-729.5) ALLERGIC CONJUNCTIVITIS (ICD-372.14) ALLERGIC RHINITIS (ICD-477.9) FAMILY HISTORY  OF ALCOHOLISM/ADDICTION (ICD-V61.41) PHYSICAL EXAMINATION (ICD-V70.0) TRANSIENT ISCHEMIC ATTACKS, HX OF (ICD-V12.50) HYPERGLYCEMIA (ICD-790.29) MYOCARDIAL INFARCTION, HX OF (ICD-412) HYPERTENSION (ICD-401.9) CORONARY ARTERY DISEASE (ICD-414.00) PROSTATE CANCER, HX OF (ICD-V10.46) ATRIAL FIBRILLATION (ICD-427.31) ASTHMA (ICD-493.90) HIP PAIN, LEFT (ICD-719.45) SHOULDER PAIN, LEFT (ICD-719.41)  Past Surgical History: Last updated: 04/15/2008 UPPP Tonsillectomy,adenoidectomy knee surgery X 3--arthroscopy Prostatectomy  02/02, prostate ca  Family History: Last updated: 02/14/2008 Family History of Alcoholism/Addiction Family History of Heart Disease: Brother, Father  Social History: Last updated: 05/19/2008 Occupation: Museum/gallery conservator paper and Engineer, petroleum Married Alcohol use-yes Regular exercise-yes---walking regularly (2010) Patient is a former smoker.  Illicit Drug Use - no Patient gets regular exercise.  Risk Factors: Alcohol Use: 4+ (02/11/2009) Exercise: yes (02/18/2008)  Risk Factors: Smoking Status: quit > 6 months (02/11/2009)  Review of Systems      See HPI  The patient denies anorexia, fever, weight loss, weight gain, vision loss, decreased hearing, hoarseness, chest pain, syncope, dyspnea on exertion, peripheral edema, prolonged cough, headaches, hemoptysis, abdominal pain, and severe indigestion/heartburn.    Vital Signs:  Patient profile:   75 year old male Height:      73 inches Weight:      190.25 pounds BMI:     25.19 O2 Sat:      97 % on Room air Pulse rate:   46 / minute BP sitting:   118 / 72  (left arm) Cuff size:   regular  Vitals Entered By: Gweneth Dimitri RN (April 15, 2009 9:40 AM)  O2 Flow:  Room air CC: Follow up. states breathing is doing fine. c/o itchy, watery eyes, runny nose, nasal and head congestion x 1-1/2 months.  states he will occ get some clear mucus out.  states he is not able to use cpap effectively due to the nasal  congestion.  states dulera did not help with throat irritation so he went back to advair.   Comments Medications reviewed with patient Daytime contact number verified with patient. Gweneth Dimitri RN  April 15, 2009 9:37 AM    Physical Exam  Additional Exam:  General: A/Ox3; pleasant and cooperative, NAD, tired -appearing NODES: no lymphadenopathy HEENT: [place]/AT, EOM- WNL, Conjuctivae- clear, PERRLA, marked periorbital edema, TM-WNL, Nose- much nose blowing, mucosa is pale, sniffing and blowing Throat- S/p UPPP, pale with no postnasal drip, S/P UPPP NECK: Supple w/ fair ROM, JVD- none, normal carotid impulses w/o bruits Thyroid- CHEST: Clear to P&A HEART: RRR, no m/g/r heard ABDOMEN: Soft and nl;  ZOX:WRUE, nl pulses, no edema  NEURO: Grossly intact to observation, tremulous      Impression & Recommendations:  Problem # 1:  OBSTRUCTIVE SLEEP APNEA (ICD-327.23)  Intolerant of CPAP due to nasal congestion. I don't know why [facility] didn't get him a humidifier as ordered. This has become a real issue. We will had the humidification.  Orders: Est. Patient Level III (45409) DME Referral (DME)  Problem # 2:  INSOMNIA-SLEEP DISORDER-UNSPEC (ICD-780.52)  Inability to sleep is due to physical discomfort with his nose. His updated medication list for this problem includes:    Zolpidem Tartrate 5 Mg Tabs (Zolpidem tartrate) .Corey [name]... 1/2-1 by mouth at night as needed  Orders: Est. Patient Level III (81191)  Problem # 3:  ALLERGIC RHINITIS (ICD-477.9)  Significant component now of rhinitis medicamentosa.  We discussed transition back to use of nasal steroid he never found very helpful in past. He cites hx of septal deviation which I don't see now. We will ask ENT evaluation. His updated medication list for this problem includes:    Nasal Relief 0.05 % Soln (Oxymetazoline hcl) ..... Uad    Fluticasone Propionate 50 Mcg/act Susp (Fluticasone propionate) .Corey [name]... 2 sprays each nostril once every  day  Problem # 4:  ASTHMA (ICD-493.90) Needs rescue inhaler. Discussed HFA. Discussed Advair.  Medications Added to Medication List This Visit: 1)  Proair Hfa 108 (90 Base) Mcg/act Aers (Albuterol sulfate) .... 2 puffs four times a day as needed rescue inhaler 2)  Nasal Relief 0.05 % Soln (Oxymetazoline hcl) .... Uad 3)  Fluticasone Propionate 50 Mcg/act Susp (Fluticasone propionate) .... 2 sprays each nostril once every day  Other Orders: ENT Referral (ENT)  Patient Instructions: 1)  Please schedule a follow-up appointment in 1 month. 2)  See [facility] to set ENT appointment and also to get humidifier for CPAP. 3)  Start sample Omnaris nasal steroid spray 2 sprays each nostril once daily 4)  Script printed for generic/ fluticasone 5)  Script for proair rescue inhaler Prescriptions: PROAIR HFA 108 (90 BASE) MCG/ACT AERS (ALBUTEROL SULFATE) 2 puffs four times a day as needed rescue inhaler  #1 x prn   Entered and Authorized by:   Waymon Budge MD   Signed by:   Waymon Budge MD on 04/15/2009   Method used:   Print then Give to Patient   RxID:   [id number] FLUTICASONE PROPIONATE 50 MCG/ACT SUSP (FLUTICASONE PROPIONATE) 2 sprays each nostril once every day  #1 x prn   Entered and Authorized by:   Waymon Budge MD   Signed by:   Waymon Budge MD on 04/15/2009   Method used:   Print then Give to Patient   RxID:   [id number]

## 2010-02-23 NOTE — Miscellaneous (Signed)
Summary: Injection Record/[facility]  Injection Record/[facility]   Imported By: Sherian Rein 06/16/2009 12:11:36  _____________________________________________________________________  External Attachment:    Type:   Image     Comment:   External Document

## 2010-02-23 NOTE — Letter (Signed)
Summary: CMN for CPAP Supplies/[facility]  CMN for CPAP Supplies/[facility]   Imported By: Sherian Rein 08/19/2009 08:32:23  _____________________________________________________________________  External Attachment:    Type:   Image     Comment:   External Document

## 2010-02-23 NOTE — Progress Notes (Signed)
Summary: recent choking/ needs callback ASAP  Phone Note Call from Patient Call back at Home Phone [phone]   Caller: Patient Call For: young Summary of Call: pt states he spoke to someone at 7 am today re: choking on water. says he has dealt with this since yesterday at noon. was told by doc on call to go to er if needed. pt wants to know what dr young thinks. he says he is "not in immediately danger" but concerned that he has not heard back. choked when he had a sip of cold water last night and has been struggling with this ever since. does not believe it's reflux. wants to hear back from someone ASAP.  Initial call taken by: Tivis Ringer, CNA,  September 30, 2009 12:17 PM  Follow-up for Phone Call        Called, spoke with pt.  He states he chocked on a spi of water yesterday at lunch.  Since then he is constantly clearing throat.  States it was so bad last night he called and spoke with the on call doctor-Dr. Sung Amabile.  States Dr. Sung Amabile told him to go to the ER is worsens and if no better today then call back.  Pt denies any change from baseline except the constant throat clearing.   Dr. Maple Hudson, you are booked this evening but did have 1 opening in the am.  I scheduled the pt for then but pls advise if you would like to see him sooner or rec ER.  Thanks! Follow-up by: Gweneth Dimitri RN,  September 30, 2009 12:36 PM  Additional Follow-up for Phone Call Additional follow up Details #1::        I called him. Story as above. Doesn't sound as if there was aspiration of any solid. he will rest voice today and keep appointment for tomorrow. Additional Follow-up by: Waymon Budge MD,  September 30, 2009 12:58 PM

## 2010-02-23 NOTE — Miscellaneous (Signed)
Summary: Injection Record / [facility]    Injection Record /  Allergy    Imported By: Lennie Odor 09/25/2009 10:08:22  _____________________________________________________________________  External Attachment:    Type:   Image     Comment:   External Document

## 2010-02-23 NOTE — Consult Note (Signed)
Summary: [facility]  [facility]   Imported By: Sherian Rein 04/27/2009 11:45:09  _____________________________________________________________________  External Attachment:    Type:   Image     Comment:   External Document

## 2010-02-23 NOTE — Assessment & Plan Note (Signed)
Summary: 3 month fu/cb/pt rsc/cjr   Vital Signs:  Patient profile:   75 year old male Weight:      179 pounds Temp:     97.6 degrees F oral Pulse rate:   64 / minute Pulse rhythm:   regular Resp:     12 per minute BP sitting:   128 / 64  (left arm) Cuff size:   regular  Vitals Entered By: Gladis Riffle, RN (August 26, 2009 10:00 AM) CC: 3 month rov, discuss some things including weight loss Is Patient Diabetic? No   Primary Care Provider:  Kate Sweetman  CC:  3 month rov and discuss some things including weight loss.  History of Present Illness:  Follow-Up Visit      This is a 75 year old man who presents for Follow-up visit.  The patient denies chest pain and palpitations.  Since the last visit the patient notes no new problems or concerns except has had some hemoptysis in the a.m. He typically has some mucous production in the a.m. but has had 2-3 episodes over the past 3 weeks where he has coughed up some blood. He admits to fatigue---has been ongoing for months. relates to acute illness several months ago. Note weight loss with Alwyn Pea and acute illness---no subsequent weight gain.  The patient reports taking meds as prescribed.  When questioned about possible medication side effects, the patient notes none.    All other systems reviewed and were negative   Preventive Screening-Counseling & Management  Alcohol-Tobacco     Alcohol drinks/day: 4+     Smoking Status: quit > 6 months     Year Started: 1960     Year Quit: 1966     Pack years: 6 years  1/2 pack  Current Problems (verified): 1)  Allergic Rhinitis  (ICD-477.9) 2)  Asthma  (ICD-493.90) 3)  Insomnia-sleep Disorder-unspec  (ICD-780.52) 4)  Obstructive Sleep Apnea  (ICD-327.23) 5)  Alcohol Abuse, Episodic  (ICD-305.02) 6)  Diverticulosis, Colon  (ICD-562.10) 7)  Colonic Polyps, Adenomatous, Hx of  (ICD-V12.72) 8)  Gerd  (ICD-530.81) 9)  Hyperlipidemia  (ICD-272.4) 10)  Esophageal Stricture  (ICD-530.3) 11)  Transient  Ischemic Attacks, Hx of  (ICD-V12.50) 12)  Hyperglycemia  (ICD-790.29) 13)  Myocardial Infarction, Hx of  (ICD-412) 14)  Hypertension  (ICD-401.9) 15)  Coronary Artery Disease  (ICD-414.00) 16)  Prostate Cancer, Hx of  (ICD-V10.46) 17)  Atrial Fibrillation  (ICD-427.31)  Current Medications (verified): 1)  Tambocor 50 Mg Tabs (Flecainide Acetate) .... Take 1 By Mouth  Two Times A Day 2)  Altace 2.5 Mg Tabs (Ramipril) .... Once Daily 3)  Norvasc 5 Mg  Tabs (Amlodipine Besylate) .... One-Half By Mouth Daily 4)  Pantoprazole Sodium 40 Mg Tbec (Pantoprazole Sodium) .... Take 1 Tablet By Mouth Once A Day 5)  Plavix 75 Mg  Tabs (Clopidogrel Bisulfate) .... One By Mouth Daily 6)  Simvastatin 40 Mg Tabs (Simvastatin) .... Take 1 Tablet By Mouth At Bedtime 7)  Toprol Xl 25 Mg  Tb24 (Metoprolol Succinate) .... One-Half By Mouth Daily 8)  Aspirin Ec 81 Mg  Tbec (Aspirin) .... Take 1 Tablet By Mouth Once A Day 9)  Advair Diskus 500-50 Mcg/dose  Misc (Fluticasone-Salmeterol) .... Inhale 1 Puff Once Daily 10)  Proair Hfa 108 (90 Base) Mcg/act Aers (Albuterol Sulfate) .... 2 Puffs Four Times A Day As Needed Rescue Inhaler 11)  Allergy Vaccine Gh 1:10 .... Once Weekly 12)  Accolate 20 Mg Tabs (Zafirlukast) .... Take 2  Tablet By Mouth Every Morning 13)  Citrucel  Powd (Methylcellulose (Laxative)) .... As Directed 14)  Xopenex 1.25 Mg/76ml Nebu (Levalbuterol Hcl) .Marland Kitchen.. 1 Neb Three Times A Day As Needed Asthma 15)  Nebulizer Machine .... As Directed For Asthma 16)  Cpap  "6.8" Advanced 17)  Zolpidem Tartrate 5 Mg  Tabs (Zolpidem Tartrate) .... 1/2-1 By Mouth At Night As Needed 18)  Nasal Relief 0.05 % Soln (Oxymetazoline Hcl) .... Uad 19)  Fluticasone Propionate 50 Mcg/act Susp (Fluticasone Propionate) .... 2 Sprays Each Nostril Once Every Day  Allergies: 1)  Sulfamethoxazole (Sulfamethoxazole)  Past History:  Past Medical History: Last updated: 05/12/2009 DIVERTICULOSIS, COLON (ICD-562.10) Hx of  COLITIS, ACUTE (ICD-558.9) COLONIC POLYPS, ADENOMATOUS, HX OF (ICD-V12.72) GERD (ICD-530.81) HYPERLIPIDEMIA (ICD-272.4) ESOPHAGEAL STRICTURE (ICD-530.3) UNSPECIFIED HEMORRHAGE OF GASTROINTESTINAL TRACT (ICD-578.9) BACK PAIN (ICD-724.5) UPPER RESPIRATORY INFECTION (ICD-465.9) ALCOHOL ABUSE (ICD-305.00) MRI, BRAIN, ABNORMAL (ICD-794.09) HEADACHE (ICD-784.0) UNSPECIFIED DENTOFACIAL ANOMALIES (ICD-524.9) HAND PAIN (ICD-729.5) ALLERGIC CONJUNCTIVITIS (ICD-372.14) ALLERGIC RHINITIS (ICD-477.9) FAMILY HISTORY OF ALCOHOLISM/ADDICTION (ICD-V61.41) PHYSICAL EXAMINATION (ICD-V70.0) TRANSIENT ISCHEMIC ATTACKS, HX OF (ICD-V12.50) HYPERGLYCEMIA (ICD-790.29) MYOCARDIAL INFARCTION, HX OF (ICD-412) HYPERTENSION (ICD-401.9) CORONARY ARTERY DISEASE (ICD-414.00) PROSTATE CANCER, HX OF (ICD-V10.46) ATRIAL FIBRILLATION (ICD-427.31) ASTHMA (ICD-493.90) HIP PAIN, LEFT (ICD-719.45) SHOULDER PAIN, LEFT (ICD-719.41)  Past Surgical History: Last updated: 05/12/2009 UPPP Tonsillectomy,adenoidectomy knee surgery X 3--arthroscopy-bilateral (2 surgeries-right, 1 surgery-left) Prostatectomy  02/02, prostate ca  Family History: Last updated: 02/14/2008 Family History of Alcoholism/Addiction Family History of Heart Disease: Brother, Father  Social History: Last updated: 05/12/2009 Occupation: Museum/gallery conservator paper and Engineer, petroleum Married Alcohol use-yes-currently on hold Regular exercise-yes---walking regularly (2010) Patient is a former smoker. -stopped age 66 Illicit Drug Use - no  Risk Factors: Alcohol Use: 4+ (08/26/2009) Exercise: yes (02/18/2008)  Risk Factors: Smoking Status: quit > 6 months (08/26/2009)  Physical Exam  General:  alert and well-developed.   Head:  normocephalic and atraumatic.   Eyes:  pupils equal and pupils round.   Ears:  R ear normal and L ear normal.   Neck:  Supple; no masses or thyromegaly. Chest Wall:  No deformities, masses, tenderness or gynecomastia  noted. Lungs:  normal respiratory effort and no intercostal retractions.   Heart:  normal rate and regular rhythm.   Abdomen:  soft and non-tender.   Msk:  No deformity or scoliosis noted of thoracic or lumbar spine.   Neurologic:  cranial nerves II-XII intact and gait normal.   Skin:  turgor normal and color normal.   Psych:  normally interactive and good eye contact.     Impression & Recommendations:  Problem # 1:  HEMOPTYSIS UNSPECIFIED (ICD-786.30) unclear etiology suspect more sinus related but will check Xray  sxs contributed to but not caused by plavix/ASA Orders: T-2 View CXR (71020TC)  Problem # 2:  ALCOHOL ABUSE, EPISODIC (ICD-305.02) no longer binging on alcohol  Problem # 3:  HYPERTENSION (ICD-401.9) controlled continue current medications  His updated medication list for this problem includes:    Altace 2.5 Mg Tabs (Ramipril) ..... Once daily    Norvasc 5 Mg Tabs (Amlodipine besylate) ..... One-half by mouth daily    Toprol Xl 25 Mg Tb24 (Metoprolol succinate) ..... One-half by mouth daily  BP today: 128/64 Prior BP: 116/70 (07/08/2009)  Labs Reviewed: K+: 3.6 (05/12/2009) Creat: : 1.0 (05/12/2009)   Chol: 168 (12/29/2008)   HDL: 58.60 (12/29/2008)   LDL: 91 (12/29/2008)   TG: 93.0 (12/29/2008)  Problem # 4:  nCORONARY ARTERY DISEASE (ICD-414.00) no sxs continue current medications  His updated medication list  for this problem includes:    Tambocor 50 Mg Tabs (Flecainide acetate) .Marland Kitchen... Take 1 by mouth  two times a day    Altace 2.5 Mg Tabs (Ramipril) ..... Once daily    Norvasc 5 Mg Tabs (Amlodipine besylate) ..... One-half by mouth daily    Plavix 75 Mg Tabs (Clopidogrel bisulfate) ..... One by mouth daily    Toprol Xl 25 Mg Tb24 (Metoprolol succinate) ..... One-half by mouth daily    Aspirin Ec 81 Mg Tbec (Aspirin) .Marland Kitchen... Take 1 tablet by mouth once a day  Problem # 5:  FATIGUE (ICD-780.79) he readily admits some of this may be related to heat and  stress related to job change will check labs and call patient with  Orders: Venipuncture (84132) TLB-BMP (Basic Metabolic Panel-BMET) (80048-METABOL) TLB-Hepatic/Liver Function Pnl (80076-HEPATIC) TLB-CBC Platelet - w/Differential (85025-CBCD) TLB-TSH (Thyroid Stimulating Hormone) (84443-TSH) TLB-Sedimentation Rate (ESR) (85652-ESR) TLB-Testosterone, Total (84403-TESTO)  Complete Medication List: 1)  Tambocor 50 Mg Tabs (Flecainide acetate) .... Take 1 by mouth  two times a day 2)  Altace 2.5 Mg Tabs (Ramipril) .... Once daily 3)  Norvasc 5 Mg Tabs (Amlodipine besylate) .... One-half by mouth daily 4)  Pantoprazole Sodium 40 Mg Tbec (Pantoprazole sodium) .... Take 1 tablet by mouth once a day 5)  Plavix 75 Mg Tabs (Clopidogrel bisulfate) .... One by mouth daily 6)  Simvastatin 40 Mg Tabs (Simvastatin) .... Take 1 tablet by mouth at bedtime 7)  Toprol Xl 25 Mg Tb24 (Metoprolol succinate) .... One-half by mouth daily 8)  Aspirin Ec 81 Mg Tbec (Aspirin) .... Take 1 tablet by mouth once a day 9)  Advair Diskus 500-50 Mcg/dose Misc (Fluticasone-salmeterol) .... Inhale 1 puff once daily 10)  Proair Hfa 108 (90 Base) Mcg/act Aers (Albuterol sulfate) .... 2 puffs four times a day as needed rescue inhaler 11)  Allergy Vaccine Gh 1:10  .... Once weekly 12)  Accolate 20 Mg Tabs (Zafirlukast) .... Take 2 tablet by mouth every morning 13)  Citrucel Powd (Methylcellulose (laxative)) .... As directed 14)  Xopenex 1.25 Mg/31ml Nebu (Levalbuterol hcl) .Marland Kitchen.. 1 neb three times a day as needed asthma 15)  Nebulizer Machine  .... As directed for asthma 16)  Cpap "6.8" Advanced  17)  Zolpidem Tartrate 5 Mg Tabs (Zolpidem tartrate) .... 1/2-1 by mouth at night as needed 18)  Nasal Relief 0.05 % Soln (Oxymetazoline hcl) .... Uad 19)  Fluticasone Propionate 50 Mcg/act Susp (Fluticasone propionate) .... 2 sprays each nostril once every day

## 2010-02-23 NOTE — Progress Notes (Signed)
Summary: ? GI bleed?  Phone Note Call from Patient   Caller: Spouse Call For: Birdie Sons MD Summary of Call: Pt's wife calls stating pt is much worse this am ..........vomiting dark material, "coffee grounds", and extremely weak.  Per Dr. Cato Mulligan, go straight to the ER at [facility].  Initial call taken by: Lynann Beaver CMA,  May 08, 2009 8:38 AM
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
## 2010-02-23 NOTE — Assessment & Plan Note (Signed)
Summary: follow up/klw   Primary Provider/Referring Provider:  Swords  CC:  Follow up visit-cough with color phelgm at times; clearing thraot alot.Marland Kitchen  History of Present Illness: 75yo woman with  Chronic bronchitis/bronchiectasis, and a history of acute sinusitis.   11/09/07- Had Flu vax. Had pneumovax in 2003, 2005.Now retired from OR scrub tech role- she used to blame col air there for making her sick. Not as bad since retiring, some chest tightness now. Does cough, usually clear mucus. Denies chest pain or palpitation, fever, blood, edema, adenpopathy, headache. Pneumovax x 2, 2003, 2005. Had flu vax. Asks for third Pvax- discussed.  05/09/08 Chronic bronchitis/ bronchiectasis, hx acute sinusitis Had good winter wihout significant viral illness. Did get flu shots. 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 Less frequently ill since she retired. she had not been coughing but in the last 2 days has developed some chest congestion, nonproductive cough, without fever , nodes, blood or sinus pain. She has mucinex to use as needed. 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  November 10, 2008- Chronic bronchitis/ bronchiectasis, hx sinusitis Had uncomplicated shoulder surgery.Nasal congestion with cooler Fall weather. Allegra-D is sufficient. Does her nebs 2-3 x daily. Mostly nasal drainage grey.Denies headache, earache or fever. Flu vax talk. She has been more stable since she retired from OR nurse duty- always questioned role of cool OR air.  May 11, 2009- Chronic bronchitis/ bronchiectasis, hx sinusitis Just hosp for renal insufficiency-complicating dehydration from diverticulitis.Marland Kitchen He had no breathing problems. Breathing is at baseline. Prior, Dr Jenne Pane had seen him for me (ENT) and gave prednisone. Aware of throat clearing. We discussed dry powder inhalers, reflux, asthma He has tried both [place]  and Symbicort. He likes using Proair rescue inhaler.  ............................................................................................... Watch for ACEI cough  Current Medications (verified): 1)  Tambocor 50 Mg Tabs (Flecainide Acetate) .... Take 1 By Mouth  Two Times A Day 2)  Altace 2.5 Mg Tabs (Ramipril) .... Once Daily 3)  Norvasc 5 Mg  Tabs (Amlodipine Besylate) .... One-Half By Mouth Daily 4)  Hydrochlorothiazide 25 Mg  Tabs (Hydrochlorothiazide) .... One-Half By Mouth Daily 5)  Pantoprazole Sodium 40 Mg Tbec (Pantoprazole Sodium) .... Take 1 Tablet By Mouth Every Morning. 6)  Plavix 75 Mg  Tabs (Clopidogrel Bisulfate) .... One By Mouth Daily 7)  Simvastatin 40 Mg Tabs (Simvastatin) .... Take 1 Tablet By Mouth At Bedtime 8)  Toprol Xl 25 Mg  Tb24 (Metoprolol Succinate) .... One-Half By Mouth Daily 9)  Aspirin Ec 81 Mg  Tbec (Aspirin) .... Take 1 Tablet By Mouth Once A Day 10)  Advair Diskus 500-50 Mcg/dose  Misc (Fluticasone-Salmeterol) .... Inhale 1 Puff Once Daily 11)  Proair Hfa 108 (90 Base) Mcg/act Aers (Albuterol Sulfate) .... 2 Puffs Four Times A Day As Needed Rescue Inhaler 12)  Allergy Vaccine Gh 1:10 .... Once Weekly 13)  Accolate 20 Mg Tabs (Zafirlukast) .... Take 2 Tablet By Mouth Every Morning 14)  Citrucel  Powd (Methylcellulose (Laxative)) .... As Directed 15)  Xopenex 1.25 Mg/57ml Nebu (Levalbuterol Hcl) .Marland Kitchen.. 1 Neb Three Times A Day As Needed Asthma 16)  Nebulizer Machine .... As Directed For Asthma 17)  Cpap  "6.8" Advanced 18)  Zolpidem Tartrate 5 Mg  Tabs (Zolpidem Tartrate) .... 1/2-1 By Mouth At Night As Needed 19)  Nasal Relief 0.05 % Soln (Oxymetazoline Hcl) .... Uad 20)  Fluticasone Propionate 50  Mcg/act Susp (Fluticasone Propionate) .... 2 Sprays Each Nostril Once Every Day  Allergies (verified): 1)  Sulfamethoxazole (Sulfamethoxazole) 2)  Neomycin Sulfate (Neomycin Sulfate)  Past History:  Past Medical History: Last updated:  05/19/2008 Current Problems:  DIVERTICULOSIS, COLON (ICD-562.10) Hx of COLITIS, ACUTE (ICD-558.9) COLONIC POLYPS, ADENOMATOUS, HX OF (ICD-V12.72) GERD (ICD-530.81) HYPERLIPIDEMIA (ICD-272.4) ESOPHAGEAL STRICTURE (ICD-530.3) UNSPECIFIED HEMORRHAGE OF GASTROINTESTINAL TRACT (ICD-578.9) BACK PAIN (ICD-724.5) UPPER RESPIRATORY INFECTION (ICD-465.9) ALCOHOL ABUSE (ICD-305.00) MRI, BRAIN, ABNORMAL (ICD-794.09) HEADACHE (ICD-784.0) alcohol UNSPECIFIED DENTOFACIAL ANOMALIES (ICD-524.9) HAND PAIN (ICD-729.5) ALLERGIC CONJUNCTIVITIS (ICD-372.14) ALLERGIC RHINITIS (ICD-477.9) FAMILY HISTORY OF ALCOHOLISM/ADDICTION (ICD-V61.41) PHYSICAL EXAMINATION (ICD-V70.0) TRANSIENT ISCHEMIC ATTACKS, HX OF (ICD-V12.50) HYPERGLYCEMIA (ICD-790.29) MYOCARDIAL INFARCTION, HX OF (ICD-412) HYPERTENSION (ICD-401.9) CORONARY ARTERY DISEASE (ICD-414.00) PROSTATE CANCER, HX OF (ICD-V10.46) ATRIAL FIBRILLATION (ICD-427.31) ASTHMA (ICD-493.90) HIP PAIN, LEFT (ICD-719.45) SHOULDER PAIN, LEFT (ICD-719.41)  Past Surgical History: Last updated: 04/15/2008 UPPP Tonsillectomy,adenoidectomy knee surgery X 3--arthroscopy Prostatectomy  02/02, prostate ca  Family History: Last updated: 02/14/2008 Family History of Alcoholism/Addiction Family History of Heart Disease: Brother, Father  Social History: Last updated: 05/19/2008 Occupation: Museum/gallery conservator paper and Engineer, petroleum Married Alcohol use-yes Regular exercise-yes---walking regularly (2010) Patient is a former smoker.  Illicit Drug Use - no Patient gets regular exercise.  Risk Factors: Alcohol Use: 4+ (02/11/2009) Exercise: yes (02/18/2008)  Risk Factors: Smoking Status: quit > 6 months (02/11/2009)  Review of Systems      See HPI  The patient denies anorexia, fever, weight loss, weight gain, vision loss, decreased hearing, hoarseness, chest pain, syncope, dyspnea on exertion, peripheral edema, headaches, hemoptysis, abdominal pain, and severe  indigestion/heartburn.    Vital Signs:  Patient profile:   75 year old male Height:      73 inches Weight:      184.38 pounds BMI:     24.41 O2 Sat:      93 % on Room air Pulse rate:   49 / minute BP sitting:   118 / 76  (left arm) Cuff size:   regular  Vitals Entered By: Reynaldo Minium CMA (May 11, 2009 11:37 AM)  O2 Flow:  Room air  Physical Exam  Additional Exam:  General: A/Ox3; pleasant and cooperative, NAD, tired -appearing NODES: no lymphadenopathy HEENT: [place]/AT, EOM- WNL, Conjuctivae- clear, PERRLA, marked periorbital edema, TM-WNL, Nose-  mucosa is pale,  Throat- S/p UPPP, pale with no postnasal drip, S/P UPPP NECK: Supple w/ fair ROM, JVD- none, normal carotid impulses w/o bruits Thyroid- CHEST: Clear to P&A HEART: RRR, no m/g/r heard- regular pulse today ABDOMEN: Soft and nl;  ZOX:WRUE, nl pulses, no edema  NEURO: Grossly intact to observation, tremulous      Impression & Recommendations:  Problem # 1:  ASTHMA (ICD-493.90) Watching now for effect of dry powder from Advair, although he prefers Advair 500 over the alternatives.. I suggested he try throat lozenges for his throat. he is s/p UPPPP. Denies recognizing reflux. Also recognize his Alatace ACEI may be causing dry cough.  Problem # 2:  ALLERGIC RHINITIS (ICD-477.9)  Fairly good control now. His updated medication list for this problem includes:    Nasal Relief 0.05 % Soln (Oxymetazoline hcl) ..... Uad    Fluticasone Propionate 50 Mcg/act Susp (Fluticasone propionate) .Marland Kitchen... 2 sprays each nostril once every day  Medications Added to Medication List This Visit: 1)  Tambocor 50 Mg Tabs (Flecainide acetate) .... Take 1 by mouth  two times a day  Other Orders: Est. Patient Level III (45409)  Patient Instructions: 1)  Please schedule a follow-up appointment  in 4 months. 2)  Call sooner if needed 3)  Sugar free throat lozenges may help with the throat clearing

## 2010-02-23 NOTE — Miscellaneous (Signed)
Summary: Injection Record/[facility]  Injection Record/[facility]   Imported By: Sherian Rein 07/16/2009 08:25:34  _____________________________________________________________________  External Attachment:    Type:   Image     Comment:   External Document

## 2010-02-23 NOTE — Progress Notes (Signed)
Summary: nos appt  Phone Note Call from Patient   Caller: [email or web address]  Call For: young Summary of Call: Pt saw you on 4/18 and stated that he didn't need to see you again this month for his 5/9 nos. Initial call taken by: Darletta Moll,  Jun 02, 2009 10:32 AM

## 2010-02-23 NOTE — Assessment & Plan Note (Signed)
Summary: FU ON MEDS/[facility]   Vital Signs:  Patient profile:   75 year old male Weight:      193 pounds Temp:     97.8 degrees F Pulse rate:   64 / minute Pulse rhythm:   irregular Resp:     12 per minute BP sitting:   112 / 56  (left arm)  Vitals Entered By: Gladis Riffle, RN (February 11, 2009 3:04 PM)   Primary Care Provider:  Anushka Hartinger   History of Present Illness:  Follow-Up Visit      This is a 75 year old man who presents for Follow-up visit.  The patient denies chest pain, palpitations, dizziness, syncope, DOE, PND, and orthopnea.  Since the last visit the patient notes no new problems or concerns and problems with medications.  The patient reports taking meds as prescribed.  When questioned about possible medication side effects, the patient notes none.  Has had some chest congestion when he stopped Advair---has resumed.  Chronic sinus congestion---has tried steroid nose sprays---no results  All other systems reviewed and were negative   Preventive Screening-Counseling & Management  Alcohol-Tobacco     Alcohol drinks/day: 4+     Smoking Status: quit > 6 months     Year Started: 1960     Year Quit: 1966     Pack years: 6 years  1/2 pack  Current Problems (verified): 1)  Obstructive Sleep Apnea  (ICD-327.23) 2)  Alcohol Abuse, Episodic  (ICD-305.02) 3)  Diverticulosis, Colon  (ICD-562.10) 4)  Colonic Polyps, Adenomatous, Hx of  (ICD-V12.72) 5)  Gerd  (ICD-530.81) 6)  Hyperlipidemia  (ICD-272.4) 7)  Esophageal Stricture  (ICD-530.3) 8)  Transient Ischemic Attacks, Hx of  (ICD-V12.50) 9)  Hyperglycemia  (ICD-790.29) 10)  Myocardial Infarction, Hx of  (ICD-412) 11)  Hypertension  (ICD-401.9) 12)  Coronary Artery Disease  (ICD-414.00) 13)  Prostate Cancer, Hx of  (ICD-V10.46) 14)  Atrial Fibrillation  (ICD-427.31) 15)  Asthma  (ICD-493.90)  Current Medications (verified): 1)  Tambocor 100 Mg  Tabs (Flecainide Acetate) .... One By Mouth Bid 2)  Altace 2.5 Mg Tabs  (Ramipril) .... Once Daily 3)  Norvasc 5 Mg  Tabs (Amlodipine Besylate) .... One-Half By Mouth Daily 4)  Hydrochlorothiazide 25 Mg  Tabs (Hydrochlorothiazide) .... One-Half By Mouth Daily 5)  Pantoprazole Sodium 40 Mg Tbec (Pantoprazole Sodium) .... Take 1 Tablet By Mouth Every Morning. 6)  Plavix 75 Mg  Tabs (Clopidogrel Bisulfate) .... One By Mouth Daily 7)  Simvastatin 40 Mg Tabs (Simvastatin) .... Take 1 Tablet By Mouth At Bedtime 8)  Toprol Xl 25 Mg  Tb24 (Metoprolol Succinate) .... One-Half By Mouth Daily 9)  Aspirin Ec 81 Mg  Tbec (Aspirin) .... Take 1 Tablet By Mouth Once A Day 10)  Advair Diskus 500-50 Mcg/dose  Misc (Fluticasone-Salmeterol) .... Inhale 1 Puff Once Daily 11)  Albuterol 90 Mcg/act Aero Soln (Albuterol) .... 2 Puffs Every 4 Hours As Needed For Asthma 12)  Allergy Vaccine Gh 1:10 .... Once Weekly 13)  Accolate 20 Mg Tabs (Zafirlukast) .... Take 2 Tablet By Mouth Every Morning 14)  Citrucel  Powd (Methylcellulose (Laxative)) .... As Directed 15)  Xopenex 1.25 Mg/59ml Nebu (Levalbuterol Hcl) .Marland Kitchen.. 1 Neb Three Times A Day As Needed Asthma 16)  Nebulizer Machine .... As Directed For Asthma 17)  Cpap  "6.8" Advanced  Allergies: 1)  Sulfamethoxazole (Sulfamethoxazole) 2)  Neomycin Sulfate (Neomycin Sulfate)  Comments:  Nurse/Medical Assistant: FU meds and discuss ambien  The patient's medications and  allergies were reviewed with the patient and were updated in the Medication and Allergy Lists. Gladis Riffle, RN (February 11, 2009 3:05 PM)  Past History:  Past Medical History: Last updated: 05/19/2008 Current Problems:  DIVERTICULOSIS, COLON (ICD-562.10) Hx of COLITIS, ACUTE (ICD-558.9) COLONIC POLYPS, ADENOMATOUS, HX OF (ICD-V12.72) GERD (ICD-530.81) HYPERLIPIDEMIA (ICD-272.4) ESOPHAGEAL STRICTURE (ICD-530.3) UNSPECIFIED HEMORRHAGE OF GASTROINTESTINAL TRACT (ICD-578.9) BACK PAIN (ICD-724.5) UPPER RESPIRATORY INFECTION (ICD-465.9) ALCOHOL ABUSE (ICD-305.00) MRI,  BRAIN, ABNORMAL (ICD-794.09) HEADACHE (ICD-784.0) alcohol UNSPECIFIED DENTOFACIAL ANOMALIES (ICD-524.9) HAND PAIN (ICD-729.5) ALLERGIC CONJUNCTIVITIS (ICD-372.14) ALLERGIC RHINITIS (ICD-477.9) FAMILY HISTORY OF ALCOHOLISM/ADDICTION (ICD-V61.41) PHYSICAL EXAMINATION (ICD-V70.0) TRANSIENT ISCHEMIC ATTACKS, HX OF (ICD-V12.50) HYPERGLYCEMIA (ICD-790.29) MYOCARDIAL INFARCTION, HX OF (ICD-412) HYPERTENSION (ICD-401.9) CORONARY ARTERY DISEASE (ICD-414.00) PROSTATE CANCER, HX OF (ICD-V10.46) ATRIAL FIBRILLATION (ICD-427.31) ASTHMA (ICD-493.90) HIP PAIN, LEFT (ICD-719.45) SHOULDER PAIN, LEFT (ICD-719.41)  Past Surgical History: Last updated: 04/15/2008 UPPP Tonsillectomy,adenoidectomy knee surgery X 3--arthroscopy Prostatectomy  02/02, prostate ca  Family History: Last updated: 02/14/2008 Family History of Alcoholism/Addiction Family History of Heart Disease: Brother, Father  Social History: Last updated: 05/19/2008 Occupation: Museum/gallery conservator paper and Engineer, petroleum Married Alcohol use-yes Regular exercise-yes---walking regularly (2010) Patient is a former smoker.  Illicit Drug Use - no Patient gets regular exercise.  Risk Factors: Alcohol Use: 4+ (02/11/2009) Exercise: yes (02/18/2008)  Risk Factors: Smoking Status: quit > 6 months (02/11/2009)  Review of Systems       All other systems reviewed and were negative   Physical Exam  General:  alert and well-developed.   Head:  normocephalic and atraumatic.   Eyes:  pupils equal and pupils round.   Ears:  R ear normal and L ear normal.   Neck:  Supple; no masses or thyromegaly. Lungs:  Normal respiratory effort, chest expands symmetrically. Lungs are clear to auscultation, no crackles or wheezes. Heart:  normal rate and regular rhythm.   Abdomen:  Bowel sounds positive,abdomen soft and non-tender without masses, organomegaly or hernias noted. Msk:  No deformity or scoliosis noted of thoracic or lumbar spine.   Psych:   normally interactive and good eye contact.     Impression & Recommendations:  Problem # 1:  ATRIAL FIBRILLATION (ICD-427.31) no symptomatic recurrence followed by dr. Alanda Amass His updated medication list for this problem includes:    Tambocor 100 Mg Tabs (Flecainide acetate) ..... One by mouth bid    Norvasc 5 Mg Tabs (Amlodipine besylate) ..... One-half by mouth daily    Plavix 75 Mg Tabs (Clopidogrel bisulfate) ..... One by mouth daily    Toprol Xl 25 Mg Tb24 (Metoprolol succinate) ..... One-half by mouth daily    Aspirin Ec 81 Mg Tbec (Aspirin) .Marland Kitchen... Take 1 tablet by mouth once a day  Problem # 2:  INSOMNIA-SLEEP DISORDER-UNSPEC (ICD-780.52)  sleep latency and sleep interruption are both problems using a 1/4 dose of wife's ambien  His updated medication list for this problem includes:    Zolpidem Tartrate 5 Mg Tabs (Zolpidem tartrate) .Marland Kitchen... 1/2-1 by mouth at night as needed  Problem # 3:  MYOCARDIAL INFARCTION, HX OF (ICD-412) no recurrent sxs His updated medication list for this problem includes:    Tambocor 100 Mg Tabs (Flecainide acetate) ..... One by mouth bid    Altace 2.5 Mg Tabs (Ramipril) ..... Once daily    Norvasc 5 Mg Tabs (Amlodipine besylate) ..... One-half by mouth daily    Hydrochlorothiazide 25 Mg Tabs (Hydrochlorothiazide) ..... One-half by mouth daily    Plavix 75 Mg Tabs (Clopidogrel bisulfate) ..... One by mouth daily  Toprol Xl 25 Mg Tb24 (Metoprolol succinate) ..... One-half by mouth daily    Aspirin Ec 81 Mg Tbec (Aspirin) .Marland Kitchen... Take 1 tablet by mouth once a day  Complete Medication List: 1)  Tambocor 100 Mg Tabs (Flecainide acetate) .... One by mouth bid 2)  Altace 2.5 Mg Tabs (Ramipril) .... Once daily 3)  Norvasc 5 Mg Tabs (Amlodipine besylate) .... One-half by mouth daily 4)  Hydrochlorothiazide 25 Mg Tabs (Hydrochlorothiazide) .... One-half by mouth daily 5)  Pantoprazole Sodium 40 Mg Tbec (Pantoprazole sodium) .... Take 1 tablet by mouth  every morning. 6)  Plavix 75 Mg Tabs (Clopidogrel bisulfate) .... One by mouth daily 7)  Simvastatin 40 Mg Tabs (Simvastatin) .... Take 1 tablet by mouth at bedtime 8)  Toprol Xl 25 Mg Tb24 (Metoprolol succinate) .... One-half by mouth daily 9)  Aspirin Ec 81 Mg Tbec (Aspirin) .... Take 1 tablet by mouth once a day 10)  Advair Diskus 500-50 Mcg/dose Misc (Fluticasone-salmeterol) .... Inhale 1 puff once daily 11)  Albuterol 90 Mcg/act Aero Soln (Albuterol) .... 2 puffs every 4 hours as needed for asthma 12)  Allergy Vaccine Gh 1:10  .... Once weekly 13)  Accolate 20 Mg Tabs (Zafirlukast) .... Take 2 tablet by mouth every morning 14)  Citrucel Powd (Methylcellulose (laxative)) .... As directed 15)  Xopenex 1.25 Mg/95ml Nebu (Levalbuterol hcl) .Marland Kitchen.. 1 neb three times a day as needed asthma 16)  Nebulizer Machine  .... As directed for asthma 17)  Cpap "6.8" Advanced  18)  Zolpidem Tartrate 5 Mg Tabs (Zolpidem tartrate) .... 1/2-1 by mouth at night as needed Prescriptions: ZOLPIDEM TARTRATE 5 MG  TABS (ZOLPIDEM TARTRATE) 1/2-1 by mouth at night as needed  #20 x 2   Entered and Authorized by:   Birdie Sons MD   Signed by:   Birdie Sons MD on 02/11/2009   Method used:   Print then Give to Patient   RxID:   [id number]

## 2010-02-23 NOTE — Letter (Signed)
Summary: CMN for CPAP [facility]  CMN for CPAP [facility]   Imported By: Lennie Odor 03/11/2009 15:03:59  _____________________________________________________________________  External Attachment:    Type:   Image     Comment:   External Document

## 2010-02-23 NOTE — Assessment & Plan Note (Signed)
Summary: POST HOS[facility]ITAL F/U, PT. HOLD ING PLAVIX & ASA UNTIL SEEN.  (DR...    History of Present Illness Visit Type: Follow-up Visit Primary GI MD: Lina Sar MD Primary Provider: Dr Birdie Sons, MD Chief Complaint: Patient here for hospital follow up for GI bleed. Patient states that he feels better although fatigued. He has some very infrequent lower abdominal discomfort but denies any other GI symptoms at this time.  History of Present Illness:   Patient is a 75 year old male known to Dr. Juanda [name] for history of a diverticular bleeding Jan. 2010. Patient last seen at time of his colonoscopy Jan. 2010. Last Wed. patient developed acute diarrhea and at 3am the next morning he had an episode of vomiting (large volume). Emesis described as black. He is on Plavix. Became weak, went to ER where he was found to be hypotensive and in acute renal failure from volume depletion. Acute abdominal series revealed numerous dilated fluid-filled left mid abdominal small bowel loops signifying focal ileus or partial SBO.  Hgb 17.0 in ER, fell to 14.5 after volume repletion. Hemoglobin 12.2 at its lowest point. Patient admitted to hospital overnight. He was placed on Cipro and Flagyl for unclear reason, may for history of diverticulitis. CTscan was negative. During his stay at the hospital, patient had no further nausea / vomiting. Stools were heme positive but not melanotic. Patient was discharged home, told to hold Plavix and ASA and follow up at our office. Feels fine today. No nausea, BMs back to normal.    GI Review of Systems      Denies abdominal pain, acid reflux, belching, bloating, chest pain, dysphagia with liquids, dysphagia with solids, heartburn, loss of appetite, nausea, vomiting, vomiting blood, weight loss, and  weight gain.      Reports diverticulosis.     Denies anal fissure, black tarry stools, change in bowel habit, constipation, diarrhea, fecal incontinence, heme positive stool, hemorrhoids,  irritable bowel syndrome, jaundice, light color stool, liver problems, rectal bleeding, and  rectal pain.    Current Medications (verified): 1)  Tambocor 50 Mg Tabs (Flecainide Acetate) .... Take 1 By Mouth  Two Times A Day 2)  Altace 2.5 Mg Tabs (Ramipril) .... Once Daily 3)  Norvasc 5 Mg  Tabs (Amlodipine Besylate) .... One-Half By Mouth Daily 4)  Hydrochlorothiazide 25 Mg  Tabs (Hydrochlorothiazide) .... One-Half By Mouth Daily 5)  Pantoprazole Sodium 40 Mg Tbec (Pantoprazole Sodium) .... Take 1 Tablet By Mouth Every Morning. 6)  Plavix 75 Mg  Tabs (Clopidogrel Bisulfate) .... One By Mouth Daily Currently On Hold! 7)  Simvastatin 40 Mg Tabs (Simvastatin) .... Take 1 Tablet By Mouth At Bedtime 8)  Toprol Xl 25 Mg  Tb24 (Metoprolol Succinate) .... One-Half By Mouth Daily 9)  Aspirin Ec 81 Mg  Tbec (Aspirin) .... Take 1 Tablet By Mouth Once A Day Currently On Hold 10)  Advair Diskus 500-50 Mcg/dose  Misc (Fluticasone-Salmeterol) .... Inhale 1 Puff Once Daily 11)  Proair Hfa 108 (90 Base) Mcg/act Aers (Albuterol Sulfate) .... 2 Puffs Four Times A Day As Needed Rescue Inhaler 12)  Allergy Vaccine Gh 1:10 .... Once Weekly 13)  Accolate 20 Mg Tabs (Zafirlukast) .... Take 2 Tablet By Mouth Every Morning 14)  Citrucel  Powd (Methylcellulose (Laxative)) .... As Directed 15)  Xopenex 1.25 Mg/33ml Nebu (Levalbuterol Hcl) .Marland Kitchen.. 1 Neb Three Times A Day As Needed Asthma 16)  Nebulizer Machine .... As Directed For Asthma 17)  Cpap  "6.8" Advanced 18)  Zolpidem  Tartrate 5 Mg  Tabs (Zolpidem Tartrate) .... 1/2-1 By Mouth At Night As Needed 19)  Nasal Relief 0.05 % Soln (Oxymetazoline Hcl) .... Uad 20)  Fluticasone Propionate 50 Mcg/act Susp (Fluticasone Propionate) .... 2 Sprays Each Nostril Once Every Day 21)  Oyster Shell Calcium 500 + D 500-125 Mg-Unit Tabs (Calcium Carbonate-Vitamin D) .... Take 1 Tablet By Mouth Three Times A Day 22)  Ciprofloxacin Hcl 500 Mg Tabs (Ciprofloxacin Hcl) .... Take 1 Tablet  By Mouth Two Times A Day 23)  Flagyl 500 Mg Tabs (Metronidazole) .... Take 1 Tablet By Mouth Three Times A Day  Allergies (verified): 1)  Sulfamethoxazole (Sulfamethoxazole)  Past History:  Past Medical History: DIVERTICULOSIS, COLON (ICD-562.10) Hx of COLITIS, ACUTE (ICD-558.9) COLONIC POLYPS, ADENOMATOUS, HX OF (ICD-V12.72) GERD (ICD-530.81) HYPERLIPIDEMIA (ICD-272.4) ESOPHAGEAL STRICTURE (ICD-530.3) UNSPECIFIED HEMORRHAGE OF GASTROINTESTINAL TRACT (ICD-578.9) BACK PAIN (ICD-724.5) UPPER RESPIRATORY INFECTION (ICD-465.9) ALCOHOL ABUSE (ICD-305.00) MRI, BRAIN, ABNORMAL (ICD-794.09) HEADACHE (ICD-784.0) UNSPECIFIED DENTOFACIAL ANOMALIES (ICD-524.9) HAND PAIN (ICD-729.5) ALLERGIC CONJUNCTIVITIS (ICD-372.14) ALLERGIC RHINITIS (ICD-477.9) FAMILY HISTORY OF ALCOHOLISM/ADDICTION (ICD-V61.41) PHYSICAL EXAMINATION (ICD-V70.0) TRANSIENT ISCHEMIC ATTACKS, HX OF (ICD-V12.50) HYPERGLYCEMIA (ICD-790.29) MYOCARDIAL INFARCTION, HX OF (ICD-412) HYPERTENSION (ICD-401.9) CORONARY ARTERY DISEASE (ICD-414.00) PROSTATE CANCER, HX OF (ICD-V10.46) ATRIAL FIBRILLATION (ICD-427.31) ASTHMA (ICD-493.90) HIP PAIN, LEFT (ICD-719.45) SHOULDER PAIN, LEFT (ICD-719.41)  Past Surgical History: UPPP Tonsillectomy,adenoidectomy knee surgery X 3--arthroscopy-bilateral (2 surgeries-right, 1 surgery-left) Prostatectomy  02/02, prostate ca  Social History: Occupation: Careers adviser Married Alcohol use-yes-currently on hold Regular exercise-yes---walking regularly (2010) Patient is a former smoker. -stopped age 50 Illicit Drug Use - no  Review of Systems       The patient complains of arthritis/joint pain, back pain, fatigue, sleeping problems, and voice change.  The patient denies allergy/sinus, anemia, anxiety-new, blood in urine, breast changes/lumps, change in vision, confusion, cough, coughing up blood, depression-new, fainting, fever, headaches-new, hearing problems, heart  murmur, heart rhythm changes, itching, menstrual pain, muscle pains/cramps, night sweats, nosebleeds, pregnancy symptoms, shortness of breath, skin rash, sore throat, swelling of feet/legs, swollen lymph glands, thirst - excessive, urination - excessive, urination changes/pain, urine leakage, and vision changes.    Vital Signs:  Patient profile:   75 year old male Height:      73 inches Weight:      182 pounds BMI:     24.10 BSA:     2.07 Pulse rate:   64 / minute Pulse rhythm:   regular BP sitting:   100 / 54  (left arm)  Vitals Entered By: Hortense Ramal CMA Duncan Dull) (May 12, 2009 3:36 PM)  Physical Exam  General:  Well developed, well nourished, no acute distress. Head:  Normocephalic and atraumatic. Eyes:  Conjunctiva pink, no icterus.  Mouth:  No oral lesions. Tongue moist.  Neck:  no obvious masses  Lungs:  Clear throughout to auscultation. Heart:  RRR Abdomen:  Abdomen soft, nontender, nondistended. No obvious masses or hepatomegaly.Normal bowel sounds.  Rectal:  Stool light brown, heme negative. Neurologic:  Alert and  oriented x4;  grossly normal neurologically.   Impression & Recommendations:  Problem # 1:  GASTROENTERITIS (ICD-558.9) Assessment New Recently hospitalized with nausea, vomiting (one episode of black emesis), diarrhea and dehydration.  CTscan normal. Rapid resolution of symptoms. Hemoglobin 17.0 on admit but patient very dehdration. After volume repletion hemoglobin fell to a low of 12.2. No evidence for significant upper GI bleed. Patient takes Plavix and ASA but those have been placed on hold since hospital discharge.  Patient look okay, vitals are stable, abdominal  exam is benign. Will check blood counts today and call patient with the results. If labs look okay patient can stop Cipro and Flagyl. Should patient have recurrent nausea, vomiting, or diarrhea he will call our office right away.   Problem # 2:  ATRIAL FIBRILLATION (ICD-427.31) Assessment:  Comment Only  Problem # 3:  CORONARY ARTERY DISEASE (ICD-414.00) Assessment: Comment Only Plavix and ASA on hold. If blood counts okay patient can resume meds from GI standpoint.  He should take PPI in the mornings, Plavix / ASA in the evening to provide a 10-12 hour seperation between the two medications.  Other Orders: TLB-CBC Platelet - w/Differential (85025-CBCD) TLB-BMP (Basic Metabolic Panel-BMET) (80048-METABOL)  Patient Instructions: 1)  Your physician has requested that you have the following labwork done today: Go to lab, basement level. 2)  Take Protonix, 1 tab 30 min before breakfast in the morning. 3)  Restart the Plavix and Aspirin and take it seperate from the Protonix.  Take the Plavix and Aspirin in the evening.  4)  We will fax the lab results to Dr. Alanda Amass.  5)  Copy sent to : Birdie Sons, MD 6)                         Dr. Alanda Amass 7)  The medication list was reviewed and reconciled.  All changed / newly prescribed medications were explained.  A complete medication list was provided to the patient / caregiver.

## 2010-02-23 NOTE — Letter (Signed)
Summary: [facility]  [facility]   Imported By: Maryln Gottron 10/29/2009 13:02:11  _____________________________________________________________________  External Attachment:    Type:   Image     Comment:   External Document

## 2010-02-23 NOTE — Progress Notes (Signed)
Summary: Med?   Phone Note Call from Patient Call back at [phone]   Caller: Patient Call For: Dr Juanda Chance Reason for Call: Talk to Nurse Details for Reason: Plavix? Summary of Call: Pt stated he was returning your call regarding medications prior procedure. Initial call taken by: Dwan Bolt,  Jun 17, 2009 12:23 PM  Follow-up for Phone Call        Pt called.  Pt had appt with his cardiologist this am.  Per cardiologist pt can leave off Plavix for 5 days for colonoscpy.  Pt was told that by cardiologist that his office has sent a reply to our office  re this.  Pt will stop Plavix on 06/19/09. Follow-up by: Ashok Cordia RN,  Jun 17, 2009 2:55 PM

## 2010-02-23 NOTE — Progress Notes (Signed)
Summary: 1 month follow up   Phone Note Outgoing Call Call back at [facility] Phone [phone]   Call placed by: Boone Master CNA,  April 15, 2009 10:40 AM Summary of Call: pt seen in office today and was told to follow up in one month.  no available appts before May.  please advise when pt may be worked in, thanks! Initial call taken by: Boone Master CNA,  April 15, 2009 10:40 AM  Follow-up for Phone Call        called, pt's home number - [email or web address] to schedule pt  a 1 month follow up in a RN slot the week of April 18th per CY.  Gweneth Dimitri RN  April 15, 2009 12:16 PM   Additional Follow-up for Phone Call Additional follow up Details #1::        i sched an apt for pt on april 18th at 11:15a Additional Follow-up by: Valinda Hoar,  April 15, 2009 12:26 PM

## 2010-02-23 NOTE — Progress Notes (Signed)
Summary: question  Phone Note Call from Patient Call back at Home Phone [phone]   Caller: Patient--vm Call For: cell [phone] Swords MD Summary of Call: Asking when MRI to be done concerning kidney stones.  Not mentioned on last ov note, recommend return in two weeks in pt instructions. Initial call taken by: Gladis Riffle, RN,  May 18, 2009 4:35 PM  Follow-up for Phone Call        will discuss at next visit Follow-up by: Birdie Sons MD,  May 18, 2009 4:46 PM  Additional Follow-up for Phone Call Additional follow up Details #1::        Patient notified via message on personal voice mail. Additional Follow-up by: Gladis Riffle, RN,  May 19, 2009 8:39 AM

## 2010-02-23 NOTE — Letter (Signed)
Summary: EGD Instructions  [facility]  [address] [place], [place] [address]   Phone: [phone]  Fax: [phone]       DAM ASHRAF    11/25/1935    MRN: [id number]       Procedure Day /Date: 6/1/1 Wednesday     Arrival Time: 2:30 pm     Procedure Time: 3:30 pm     Location of Procedure:                    _ x _ [facility] (4th Floor)  PREPARATION FOR ENDOSCOPY   On 06/24/09 THE DAY OF THE PROCEDURE:  1.   No solid foods, milk or milk products are allowed after midnight the night before your procedure.  2.   Do not drink anything colored red or purple.  Avoid juices with pulp.  No orange juice.  3.  You may drink clear liquids until 1:30 pm, which is 2 hours before your procedure.                                                                                                CLEAR LIQUIDS INCLUDE: Water Jello Ice Popsicles Tea (sugar ok, no milk/cream) Powdered fruit flavored drinks Coffee (sugar ok, no milk/cream) Gatorade Juice: apple, white grape, white cranberry  Lemonade Clear bullion, consomm, broth Carbonated beverages (any kind) Strained chicken noodle soup Hard Candy   MEDICATION INSTRUCTIONS  Unless otherwise instructed, you should take regular prescription medications with a small sip of water as early as possible the morning of your procedure.  We will call you regarding your Plavix after we contact Dr Kandis Cocking office for directions.                OTHER INSTRUCTIONS  You will need a responsible adult at least 73 years of age to accompany you and drive you home.   This person must remain in the waiting room during your procedure.  Wear loose fitting clothing that is easily removed.  Leave jewelry and other valuables at home.  However, you may wish to bring a book to read or an iPod/MP3 player to listen to music as you wait for your procedure to start.  Remove all body piercing jewelry and leave at  home.  Total time from sign-in until discharge is approximately 2-3 hours.  You should go home directly after your procedure and rest.  You can resume normal activities the day after your procedure.  The day of your procedure you should not:   Drive   Make legal decisions   Operate machinery   Drink alcohol   Return to work  You will receive specific instructions about eating, activities and medications before you leave.    The above instructions have been reviewed and explained to me by   Lamona Curl CMA Duncan Dull)  Jun 10, 2009 10:58 AM     I fully understand and can verbalize these instructions _____________________________ Date 06/10/09

## 2010-02-23 NOTE — Procedures (Signed)
Summary: HOLD PLAVIX/[facility]  HOLD/[facility]   Imported By: Lester [place] 06/23/2009 07:58:40  _____________________________________________________________________  External Attachment:    Type:   Image     Comment:   External Document

## 2010-02-23 NOTE — Progress Notes (Signed)
Summary: refill zolpidem  Phone Note Refill Request Message from:  Fax from Pharmacy on September 07, 2009 5:08 PM  Refills Requested: Medication #1:  ZOLPIDEM TARTRATE 5 MG  TABS 1/2-1 by mouth at night as needed   Last Refilled: 08/01/2009 [facility]    Method Requested: Fax to Local Pharmacy Initial call taken by: Duard Brady LPN,  September 07, 2009 5:09 PM  Follow-up for Phone Call        faxed to pharmacy. Follow-up by: Duard Brady LPN,  September 07, 2009 5:10 PM    Prescriptions: ZOLPIDEM TARTRATE 5 MG  TABS (ZOLPIDEM TARTRATE) 1/2-1 by mouth at night as needed  #20 x 2   Entered by:   Duard Brady LPN   Authorized by:   Birdie Sons MD   Signed by:   Duard Brady LPN on 16/10/9602   Method used:   Historical   RxID:   [id number]

## 2010-02-23 NOTE — Assessment & Plan Note (Signed)
Summary: hosp. f/u--ch.    History of Present Illness Visit Type: Follow-up Visit Primary GI MD: Lina Sar MD Primary Provider: Swords Chief Complaint: follow-up from hospital, dehydration; patient still feel s fatigued, little energy History of Present Illness:   This is a 75 year old, white male who is status post recent upper GI bleed which presented with hematemesis while on Plavix 75 mg daily. He had an episode of diverticular bleeding one year ago in January 2010 which required a hospitalization. He has a history of a benign esophageal stricture requiring esophageal dilations over the past 16  years. Patient's last dilatation in May 2006 confirmed a benign stricture as well as duodenitis attributed to alcohol use. His last colonoscopy in January 2010 confirmed the presence of severe diverticulosis. He has been on pantoprazole 40 mg daily and denies any heartburn. He has occasional solid food dysphagia.    GI Review of Systems    Reports weight loss.      Denies abdominal pain, acid reflux, belching, bloating, chest pain, dysphagia with liquids, dysphagia with solids, heartburn, loss of appetite, nausea, vomiting, vomiting blood, and  weight gain.        Denies anal fissure, black tarry stools, change in bowel habit, constipation, diarrhea, diverticulosis, fecal incontinence, heme positive stool, hemorrhoids, irritable bowel syndrome, jaundice, light color stool, liver problems, rectal bleeding, and  rectal pain.    Current Medications (verified): 1)  Tambocor 50 Mg Tabs (Flecainide Acetate) .... Take 1 By Mouth  Two Times A Day 2)  Altace 2.5 Mg Tabs (Ramipril) .... Once Daily 3)  Norvasc 5 Mg  Tabs (Amlodipine Besylate) .... One-Half By Mouth Daily 4)  Pantoprazole Sodium 40 Mg Tbec (Pantoprazole Sodium) .... Take 1 Tablet By Mouth Once A Day 5)  Plavix 75 Mg  Tabs (Clopidogrel Bisulfate) .... One By Mouth Daily 6)  Simvastatin 40 Mg Tabs (Simvastatin) .... Take 1 Tablet By Mouth  At Bedtime 7)  Toprol Xl 25 Mg  Tb24 (Metoprolol Succinate) .... One-Half By Mouth Daily 8)  Aspirin Ec 81 Mg  Tbec (Aspirin) .... Take 1 Tablet By Mouth Once A Day 9)  Advair Diskus 500-50 Mcg/dose  Misc (Fluticasone-Salmeterol) .... Inhale 1 Puff Once Daily 10)  Proair Hfa 108 (90 Base) Mcg/act Aers (Albuterol Sulfate) .... 2 Puffs Four Times A Day As Needed Rescue Inhaler 11)  Allergy Vaccine Gh 1:10 .... Once Weekly 12)  Accolate 20 Mg Tabs (Zafirlukast) .... Take 2 Tablet By Mouth Every Morning 13)  Citrucel  Powd (Methylcellulose (Laxative)) .... As Directed 14)  Xopenex 1.25 Mg/71ml Nebu (Levalbuterol Hcl) .Marland Kitchen.. 1 Neb Three Times A Day As Needed Asthma 15)  Nebulizer Machine .... As Directed For Asthma 16)  Cpap  "6.8" Advanced 17)  Zolpidem Tartrate 5 Mg  Tabs (Zolpidem Tartrate) .... 1/2-1 By Mouth At Night As Needed 18)  Nasal Relief 0.05 % Soln (Oxymetazoline Hcl) .... Uad 19)  Fluticasone Propionate 50 Mcg/act Susp (Fluticasone Propionate) .... 2 Sprays Each Nostril Once Every Day  Allergies (verified): 1)  Sulfamethoxazole (Sulfamethoxazole)  Past History:  Past Medical History: Reviewed history from 05/12/2009 and no changes required. DIVERTICULOSIS, COLON (ICD-562.10) Hx of COLITIS, ACUTE (ICD-558.9) COLONIC POLYPS, ADENOMATOUS, HX OF (ICD-V12.72) GERD (ICD-530.81) HYPERLIPIDEMIA (ICD-272.4) ESOPHAGEAL STRICTURE (ICD-530.3) UNSPECIFIED HEMORRHAGE OF GASTROINTESTINAL TRACT (ICD-578.9) BACK PAIN (ICD-724.5) UPPER RESPIRATORY INFECTION (ICD-465.9) ALCOHOL ABUSE (ICD-305.00) MRI, BRAIN, ABNORMAL (ICD-794.09) HEADACHE (ICD-784.0) UNSPECIFIED DENTOFACIAL ANOMALIES (ICD-524.9) HAND PAIN (ICD-729.5) ALLERGIC CONJUNCTIVITIS (ICD-372.14) ALLERGIC RHINITIS (ICD-477.9) FAMILY HISTORY OF ALCOHOLISM/ADDICTION (ICD-V61.41) PHYSICAL EXAMINATION (ICD-V70.0)  TRANSIENT ISCHEMIC ATTACKS, HX OF (ICD-V12.50) HYPERGLYCEMIA (ICD-790.29) MYOCARDIAL INFARCTION, HX OF  (ICD-412) HYPERTENSION (ICD-401.9) CORONARY ARTERY DISEASE (ICD-414.00) PROSTATE CANCER, HX OF (ICD-V10.46) ATRIAL FIBRILLATION (ICD-427.31) ASTHMA (ICD-493.90) HIP PAIN, LEFT (ICD-719.45) SHOULDER PAIN, LEFT (ICD-719.41)  Past Surgical History: Reviewed history from 05/12/2009 and no changes required. UPPP Tonsillectomy,adenoidectomy knee surgery X 3--arthroscopy-bilateral (2 surgeries-right, 1 surgery-left) Prostatectomy  02/02, prostate ca  Family History: Reviewed history from 02/14/2008 and no changes required. Family History of Alcoholism/Addiction Family History of Heart Disease: Brother, Father  Social History: Reviewed history from 05/12/2009 and no changes required. Occupation: Careers adviser Married Alcohol use-yes-currently on hold Regular exercise-yes---walking regularly (2010) Patient is a former smoker. -stopped age 80 Illicit Drug Use - no  Review of Systems       The patient complains of allergy/sinus, arthritis/joint pain, back pain, fatigue, and heart rhythm changes.  The patient denies anemia, anxiety-new, blood in urine, breast changes/lumps, change in vision, confusion, cough, coughing up blood, depression-new, fainting, fever, headaches-new, hearing problems, heart murmur, itching, menstrual pain, muscle pains/cramps, night sweats, nosebleeds, pregnancy symptoms, shortness of breath, skin rash, sleeping problems, sore throat, swelling of feet/legs, swollen lymph glands, thirst - excessive , urination - excessive , urination changes/pain, urine leakage, vision changes, and voice change.         Pertinent positive and negative review of systems were noted in the above HPI. All other ROS was otherwise negative.   Vital Signs:  Patient profile:   75 year old male Height:      73 inches Weight:      178 pounds BMI:     23.57 Pulse rate:   72 / minute Pulse rhythm:   regular BP sitting:   110 / 70  (left arm) Cuff size:    regular  Vitals Entered By: June McMurray CMA Duncan Dull) (Jun 10, 2009 10:12 AM)  Physical Exam  General:  alert and oriented. Appears somewhat anxious and nervous. Eyes:  nonicteric. Mouth:  No deformity or lesions, dentition normal. Neck:  Supple; no masses or thyromegaly. Lungs:  Clear throughout to auscultation. Heart:  Regular rate and rhythm; no murmurs, rubs,  or bruits. Abdomen:  soft abdomen with normoactive bowel sounds. No tenderness. Liver edge at costal margin. No ascites. Rectal:  soft Hemoccult negative stool. Extremities:  No clubbing, cyanosis, edema or deformities noted. Skin:  spider nevi on his face. Psych:  appears anxious.   Impression & Recommendations:  Problem # 1:  ESOPHAGEAL STRICTURE (ICD-530.3) Patient had a recent upper GI bleed presenting with hematemesis of coffee ground material. He is currently asymptomatic and Hemoccult-negative. He needs to have a repeat upper endoscopy to rule out Barrett's esophagus, duodenal ulcer  and to proceed with a dilatation. I would prefer to do the dilation off Plavix. We will ask Dr Alanda Amass to okay holding the Plavix for 5 days prior to the endoscopy. Patient is to cntinue Protonix 40 mg daily.  Problem # 2:  DIVERTICULOSIS, COLON (ICD-562.10) Patient has severe left colon diverticulosis. He is status post diverticular bleed in January 2010.  Problem # 3:  COLONIC POLYPS, ADENOMATOUS, HX OF (ICD-V12.72) Patient had adenomatous polyps in January 2010. A recall colonoscopy will be due in January 2015.  Other Orders: EGD SAV (EGD SAV)  Patient Instructions: 1)  upper endoscopy with possible dilation. We will ask Dr. Alanda Amass if it is okay to hold patient's Plavix for 5 days prior to the procedure. 2)  Continue pantoprazole 40 mg daily. 3)  Recall colonoscopy January 2015. 4)  Continue Metamucil daily. 5)  Copy sent to : Dr Alanda Amass, Dr Cato Mulligan 6)  The medication list was reviewed and reconciled.  All changed / newly  prescribed medications were explained.  A complete medication list was provided to the patient / caregiver.

## 2010-02-23 NOTE — Progress Notes (Signed)
Summary: question about labs  Phone Note Call from Patient Call back at Home Phone [phone]   Caller: Patient via voice mail Call For: Birdie Sons MD Summary of Call: Is concerned as told he had low testosterone and more blood had to be drawn.  Wants to know what is next  step. Initial call taken by: Gladis Riffle, RN,  September 16, 2009 2:45 PM  Follow-up for Phone Call        he is correct schedule ov to discuss testosterone replacement Follow-up by: Birdie Sons MD,  September 16, 2009 3:37 PM  Additional Follow-up for Phone Call Additional follow up Details #1::        Pt wife notified and appt made for 09/23/09. Additional Follow-up by: Gladis Riffle, RN,  September 17, 2009 11:22 AM

## 2010-02-23 NOTE — Assessment & Plan Note (Signed)
Summary: 6 wk rov/njr   Vital Signs:  Patient profile:   75 year old male Weight:      179 pounds BMI:     23.70 Temp:     97.6 degrees F oral Pulse rate:   62 / minute Pulse rhythm:   regular Resp:     12 per minute BP sitting:   116 / 70  (left arm) Cuff size:   regular  Vitals Entered By: Gladis Riffle, RN (July 08, 2009 11:04 AM) CC: 6 week rov--states has recovered from dehydration Is Patient Diabetic? No   Primary Care Provider:  [facility]  CC:  6 week rov--states has recovered from dehydration.  History of Present Illness:  Follow-Up Visit      This is a 75 year old man who presents for Follow-up visit.  The patient denies chest pain and palpitations.  Since the last visit the patient notes no new problems or concerns.  The patient reports taking meds as prescribed.  When questioned about possible medication side effects, the patient notes none.  reviewed previous notes. He is feeling much better.  All other systems reviewed and were negative   Preventive Screening-Counseling & Management  Alcohol-Tobacco     Alcohol drinks/day: 4+     Smoking Status: quit > 6 months     Year Started: 1960     Year Quit: 1966  Current Medications (verified): 1)  Tambocor 50 Mg Tabs (Flecainide Acetate) .... Take 1 By Mouth  Two Times A Day 2)  Altace 2.5 Mg Tabs (Ramipril) .... Once Daily 3)  Norvasc 5 Mg  Tabs (Amlodipine Besylate) .... One-Half By Mouth Daily 4)  Pantoprazole Sodium 40 Mg Tbec (Pantoprazole Sodium) .... Take 1 Tablet By Mouth Once A Day 5)  Plavix 75 Mg  Tabs (Clopidogrel Bisulfate) .... One By Mouth Daily 6)  Simvastatin 40 Mg Tabs (Simvastatin) .... Take 1 Tablet By Mouth At Bedtime 7)  Toprol Xl 25 Mg  Tb24 (Metoprolol Succinate) .... One-Half By Mouth Daily 8)  Aspirin Ec 81 Mg  Tbec (Aspirin) .... Take 1 Tablet By Mouth Once A Day 9)  Advair Diskus 500-50 Mcg/dose  Misc (Fluticasone-Salmeterol) .... Inhale 1 Puff Once Daily 10)  Proair Hfa 108 (90 Base)  Mcg/act Aers (Albuterol Sulfate) .... 2 Puffs Four Times A Day As Needed Rescue Inhaler 11)  Allergy Vaccine Gh 1:10 .... Once Weekly 12)  Accolate 20 Mg Tabs (Zafirlukast) .... Take 2 Tablet By Mouth Every Morning 13)  Citrucel  Powd (Methylcellulose (Laxative)) .... As Directed 14)  Xopenex 1.25 Mg/17ml Nebu (Levalbuterol Hcl) .Marland Kitchen.. 1 Neb Three Times A Day As Needed Asthma 15)  Nebulizer Machine .... As Directed For Asthma 16)  Cpap  "6.8" Advanced 17)  Zolpidem Tartrate 5 Mg  Tabs (Zolpidem Tartrate) .... 1/2-1 By Mouth At Night As Needed 18)  Nasal Relief 0.05 % Soln (Oxymetazoline Hcl) .... Uad 19)  Fluticasone Propionate 50 Mcg/act Susp (Fluticasone Propionate) .... 2 Sprays Each Nostril Once Every Day  Allergies: 1)  Sulfamethoxazole (Sulfamethoxazole)  Past History:  Past Medical History: Last updated: 05/12/2009 DIVERTICULOSIS, COLON (ICD-562.10) Hx of COLITIS, ACUTE (ICD-558.9) COLONIC POLYPS, ADENOMATOUS, HX OF (ICD-V12.72) GERD (ICD-530.81) HYPERLIPIDEMIA (ICD-272.4) ESOPHAGEAL STRICTURE (ICD-530.3) UNSPECIFIED HEMORRHAGE OF GASTROINTESTINAL TRACT (ICD-578.9) BACK PAIN (ICD-724.5) UPPER RESPIRATORY INFECTION (ICD-465.9) ALCOHOL ABUSE (ICD-305.00) MRI, BRAIN, ABNORMAL (ICD-794.09) HEADACHE (ICD-784.0) UNSPECIFIED DENTOFACIAL ANOMALIES (ICD-524.9) HAND PAIN (ICD-729.5) ALLERGIC CONJUNCTIVITIS (ICD-372.14) ALLERGIC RHINITIS (ICD-477.9) FAMILY HISTORY OF ALCOHOLISM/ADDICTION (ICD-V61.41) PHYSICAL EXAMINATION (ICD-V70.0) TRANSIENT ISCHEMIC ATTACKS, HX OF (  ICD-V12.50) HYPERGLYCEMIA (ICD-790.29) MYOCARDIAL INFARCTION, HX OF (ICD-412) HYPERTENSION (ICD-401.9) CORONARY ARTERY DISEASE (ICD-414.00) PROSTATE CANCER, HX OF (ICD-V10.46) ATRIAL FIBRILLATION (ICD-427.31) ASTHMA (ICD-493.90) HIP PAIN, LEFT (ICD-719.45) SHOULDER PAIN, LEFT (ICD-719.41)  Past Surgical History: Last updated: 05/12/2009 UPPP Tonsillectomy,adenoidectomy knee surgery X 3--arthroscopy-bilateral  (2 surgeries-right, 1 surgery-left) Prostatectomy  02/02, prostate ca  Family History: Last updated: 02/14/2008 Family History of Alcoholism/Addiction Family History of Heart Disease: Brother, Father  Social History: Last updated: 05/12/2009 Occupation: Museum/gallery conservator paper and Engineer, petroleum Married Alcohol use-yes-currently on hold Regular exercise-yes---walking regularly (2010) Patient is a former smoker. -stopped age 75 Illicit Drug Use - no  Risk Factors: Alcohol Use: 4+ (07/08/2009) Exercise: yes (02/18/2008)  Risk Factors: Smoking Status: quit > 6 months (07/08/2009)  Physical Exam  General:  alert and well-developed.   Head:  normocephalic and atraumatic.   Eyes:  pupils equal and pupils round.   Ears:  R ear normal and L ear normal.   Neck:  Supple; no masses or thyromegaly. Chest Wall:  No deformities, masses, tenderness or gynecomastia noted. Lungs:  normal respiratory effort and no intercostal retractions.   Heart:  normal rate and regular rhythm.   Abdomen:  soft and non-tender.   Neurologic:  cranial nerves II-XII intact and gait normal.   Skin:  turgor normal and color normal.   Psych:  normally interactive and good eye contact.     Impression & Recommendations:  Problem # 1:  HYPERLIPIDEMIA (ICD-272.4) previously controlled continue current medications  His updated medication list for this problem includes:    Simvastatin 40 Mg Tabs (Simvastatin) .Marland Kitchen... Take 1 tablet by mouth at bedtime  Labs Reviewed: SGOT: 29 (05/07/2009)   SGPT: 22 (05/07/2009)   HDL:58.60 (12/29/2008), 59.20 (05/19/2008)  LDL:91 (12/29/2008), 95 (05/19/2008)  Chol:168 (12/29/2008), 170 (05/19/2008)  Trig:93.0 (12/29/2008), 78.0 (05/19/2008)  Problem # 2:  ATRIAL FIBRILLATION (ICD-427.31) no recurent sxs continue current medications  His updated medication list for this problem includes:    Tambocor 50 Mg Tabs (Flecainide acetate) .Marland Kitchen... Take 1 by mouth  two times a day     Norvasc 5 Mg Tabs (Amlodipine besylate) ..... One-half by mouth daily    Plavix 75 Mg Tabs (Clopidogrel bisulfate) ..... One by mouth daily    Toprol Xl 25 Mg Tb24 (Metoprolol succinate) ..... One-half by mouth daily    Aspirin Ec 81 Mg Tbec (Aspirin) .Marland Kitchen... Take 1 tablet by mouth once a day  Problem # 3:  GERD (ICD-530.81) controlled continue current medications  His updated medication list for this problem includes:    Pantoprazole Sodium 40 Mg Tbec (Pantoprazole sodium) .Marland Kitchen... Take 1 tablet by mouth once a day  Problem # 4:  MYOCARDIAL INFARCTION, HX OF (ICD-412) no recurrent sxs stay on same meds His updated medication list for this problem includes:    Tambocor 50 Mg Tabs (Flecainide acetate) .Marland Kitchen... Take 1 by mouth  two times a day    Altace 2.5 Mg Tabs (Ramipril) ..... Once daily    Norvasc 5 Mg Tabs (Amlodipine besylate) ..... One-half by mouth daily    Plavix 75 Mg Tabs (Clopidogrel bisulfate) ..... One by mouth daily    Toprol Xl 25 Mg Tb24 (Metoprolol succinate) ..... One-half by mouth daily    Aspirin Ec 81 Mg Tbec (Aspirin) .Marland Kitchen... Take 1 tablet by mouth once a day  Complete Medication List: 1)  Tambocor 50 Mg Tabs (Flecainide acetate) .... Take 1 by mouth  two times a day 2)  Altace 2.5 Mg Tabs (Ramipril) .Marland KitchenMarland KitchenMarland Kitchen  Once daily 3)  Norvasc 5 Mg Tabs (Amlodipine besylate) .... One-half by mouth daily 4)  Pantoprazole Sodium 40 Mg Tbec (Pantoprazole sodium) .... Take 1 tablet by mouth once a day 5)  Plavix 75 Mg Tabs (Clopidogrel bisulfate) .... One by mouth daily 6)  Simvastatin 40 Mg Tabs (Simvastatin) .... Take 1 tablet by mouth at bedtime 7)  Toprol Xl 25 Mg Tb24 (Metoprolol succinate) .... One-half by mouth daily 8)  Aspirin Ec 81 Mg Tbec (Aspirin) .... Take 1 tablet by mouth once a day 9)  Advair Diskus 500-50 Mcg/dose Misc (Fluticasone-salmeterol) .... Inhale 1 puff once daily 10)  Proair Hfa 108 (90 Base) Mcg/act Aers (Albuterol sulfate) .... 2 puffs four times a day as needed  rescue inhaler 11)  Allergy Vaccine Gh 1:10  .... Once weekly 12)  Accolate 20 Mg Tabs (Zafirlukast) .... Take 2 tablet by mouth every morning 13)  Citrucel Powd (Methylcellulose (laxative)) .... As directed 14)  Xopenex 1.25 Mg/64ml Nebu (Levalbuterol hcl) .Marland Kitchen.. 1 neb three times a day as needed asthma 15)  Nebulizer Machine  .... As directed for asthma 16)  Cpap "6.8" Advanced  17)  Zolpidem Tartrate 5 Mg Tabs (Zolpidem tartrate) .... 1/2-1 by mouth at night as needed 18)  Nasal Relief 0.05 % Soln (Oxymetazoline hcl) .... Uad 19)  Fluticasone Propionate 50 Mcg/act Susp (Fluticasone propionate) .... 2 sprays each nostril once every day  Patient Instructions: 1)  Please schedule a follow-up appointment in 3 months. Prescriptions: ZOLPIDEM TARTRATE 5 MG  TABS (ZOLPIDEM TARTRATE) 1/2-1 by mouth at night as needed  #20 x 2   Entered and Authorized by:   Birdie Sons MD   Signed by:   Birdie Sons MD on 07/08/2009   Method used:   Print then Give to Patient   RxID:   [id number]

## 2010-02-23 NOTE — Assessment & Plan Note (Signed)
Summary: diarrhea, Cardiologist wants him to see Dr. Cato Mulligan to rule ou...   Vital Signs:  Patient profile:   75 year old male Weight:      182 pounds BMI:     24.10 Temp:     98.1 degrees F oral Pulse rate:   62 / minute Pulse rhythm:   regular Resp:     12 per minute BP sitting:   118 / 70  (left arm) Cuff size:   regular  Vitals Entered By: Gladis Riffle, RN (May 07, 2009 11:01 AM) CC: c/o diarrhes 3AM-yesterday afternoon with joint pain--has returned today with headache added--called cardiologist as feared atrial fib and told to see pcp Is Patient Diabetic? No   Primary Care Provider:  [facility]  CC:  c/o diarrhes 3AM-yesterday afternoon with joint pain--has returned today with headache added--called cardiologist as feared atrial fib and told to see pcp.  History of Present Illness: sudden onset sednesday at 3a.m. diarrhea 12 watery BMs , sxs resolved  sxs recurred 3a.m. today---6 watery BMs  feels generally poorly aches all over feels hot mild headache no unusual foods known no ill contacts  All other systems reviewed and were negative   Preventive Screening-Counseling & Management  Alcohol-Tobacco     Alcohol drinks/day: 4+     Smoking Status: quit > 6 months     Year Started: 1960     Year Quit: 1966     Pack years: 6 years  1/2 pack  Current Medications (verified): 1)  Tambocor 100 Mg  Tabs (Flecainide Acetate) .... One By Mouth Bid 2)  Altace 2.5 Mg Tabs (Ramipril) .... Once Daily 3)  Norvasc 5 Mg  Tabs (Amlodipine Besylate) .... One-Half By Mouth Daily 4)  Hydrochlorothiazide 25 Mg  Tabs (Hydrochlorothiazide) .... One-Half By Mouth Daily 5)  Pantoprazole Sodium 40 Mg Tbec (Pantoprazole Sodium) .... Take 1 Tablet By Mouth Every Morning. 6)  Plavix 75 Mg  Tabs (Clopidogrel Bisulfate) .... One By Mouth Daily 7)  Simvastatin 40 Mg Tabs (Simvastatin) .... Take 1 Tablet By Mouth At Bedtime 8)  Toprol Xl 25 Mg  Tb24 (Metoprolol Succinate) .... One-Half By Mouth  Daily 9)  Aspirin Ec 81 Mg  Tbec (Aspirin) .... Take 1 Tablet By Mouth Once A Day 10)  Advair Diskus 500-50 Mcg/dose  Misc (Fluticasone-Salmeterol) .... Inhale 1 Puff Once Daily 11)  Proair Hfa 108 (90 Base) Mcg/act Aers (Albuterol Sulfate) .... 2 Puffs Four Times A Day As Needed Rescue Inhaler 12)  Allergy Vaccine Gh 1:10 .... Once Weekly 13)  Accolate 20 Mg Tabs (Zafirlukast) .... Take 2 Tablet By Mouth Every Morning 14)  Citrucel  Powd (Methylcellulose (Laxative)) .... As Directed 15)  Xopenex 1.25 Mg/48ml Nebu (Levalbuterol Hcl) .Marland Kitchen.. 1 Neb Three Times A Day As Needed Asthma 16)  Nebulizer Machine .... As Directed For Asthma 17)  Cpap  "6.8" Advanced 18)  Zolpidem Tartrate 5 Mg  Tabs (Zolpidem Tartrate) .... 1/2-1 By Mouth At Night As Needed 19)  Nasal Relief 0.05 % Soln (Oxymetazoline Hcl) .... Uad 20)  Fluticasone Propionate 50 Mcg/act Susp (Fluticasone Propionate) .... 2 Sprays Each Nostril Once Every Day  Allergies: 1)  Sulfamethoxazole (Sulfamethoxazole)  Past History:  Past Medical History: Last updated: 05/19/2008 Current Problems:  DIVERTICULOSIS, COLON (ICD-562.10) Hx of COLITIS, ACUTE (ICD-558.9) COLONIC POLYPS, ADENOMATOUS, HX OF (ICD-V12.72) GERD (ICD-530.81) HYPERLIPIDEMIA (ICD-272.4) ESOPHAGEAL STRICTURE (ICD-530.3) UNSPECIFIED HEMORRHAGE OF GASTROINTESTINAL TRACT (ICD-578.9) BACK PAIN (ICD-724.5) UPPER RESPIRATORY INFECTION (ICD-465.9) ALCOHOL ABUSE (ICD-305.00) MRI, BRAIN, ABNORMAL (ICD-794.09) HEADACHE (  ICD-784.0) alcohol UNSPECIFIED DENTOFACIAL ANOMALIES (ICD-524.9) HAND PAIN (ICD-729.5) ALLERGIC CONJUNCTIVITIS (ICD-372.14) ALLERGIC RHINITIS (ICD-477.9) FAMILY HISTORY OF ALCOHOLISM/ADDICTION (ICD-V61.41) PHYSICAL EXAMINATION (ICD-V70.0) TRANSIENT ISCHEMIC ATTACKS, HX OF (ICD-V12.50) HYPERGLYCEMIA (ICD-790.29) MYOCARDIAL INFARCTION, HX OF (ICD-412) HYPERTENSION (ICD-401.9) CORONARY ARTERY DISEASE (ICD-414.00) PROSTATE CANCER, HX OF (ICD-V10.46) ATRIAL  FIBRILLATION (ICD-427.31) ASTHMA (ICD-493.90) HIP PAIN, LEFT (ICD-719.45) SHOULDER PAIN, LEFT (ICD-719.41)  Past Surgical History: Last updated: 04/15/2008 UPPP Tonsillectomy,adenoidectomy knee surgery X 3--arthroscopy Prostatectomy  02/02, prostate ca  Family History: Last updated: 02/14/2008 Family History of Alcoholism/Addiction Family History of Heart Disease: Brother, Father  Social History: Last updated: 05/19/2008 Occupation: Museum/gallery conservator paper and Engineer, petroleum Married Alcohol use-yes Regular exercise-yes---walking regularly (2010) Patient is a former smoker.  Illicit Drug Use - no Patient gets regular exercise.  Risk Factors: Alcohol Use: 4+ (05/07/2009) Exercise: yes (02/18/2008)  Risk Factors: Smoking Status: quit > 6 months (05/07/2009)  Review of Systems       fatigue, weakness, body aches All other systems reviewed and were negative   Physical Exam  General:  alert and well-developed.  mildly ill appearing Head:  normocephalic and atraumatic.   Ears:  R ear normal and L ear normal.   Nose:  no external deformity and no external erythema.   Mouth:  moist oropharynx Neck:  Supple; no masses or thyromegaly. Lungs:  normal respiratory effort and no intercostal retractions.   Heart:  normal rate and regular rhythm.   Abdomen:  soft and non-tender.  active BS Msk:  No deformity or scoliosis noted of thoracic or lumbar spine.   Neurologic:  cranial nerves II-XII intact and gait normal.     Impression & Recommendations:  Problem # 1:  DIARRHEA (ICD-787.91)  acute gastroenteritis ? "food poisoning"  somewhat unusual that sxs have lasted this long.  check labs he needs to stay well hydrated all of the above discussed with patient and wife he will call for any recurrent sxs or worsening  Orders: Venipuncture (16109) TLB-BMP (Basic Metabolic Panel-BMET) (80048-METABOL) TLB-CBC Platelet - w/Differential (85025-CBCD) TLB-Hepatic/Liver Function Pnl  (80076-HEPATIC)  Complete Medication List: 1)  Tambocor 50 Mg Tabs (Flecainide acetate) .... Take 1 by mouth  two times a day 2)  Altace 2.5 Mg Tabs (Ramipril) .... Once daily 3)  Norvasc 5 Mg Tabs (Amlodipine besylate) .... One-half by mouth daily 4)  Pantoprazole Sodium 40 Mg Tbec (Pantoprazole sodium) .... Take 1 tablet by mouth once a day 5)  Plavix 75 Mg Tabs (Clopidogrel bisulfate) .... One by mouth daily 6)  Simvastatin 40 Mg Tabs (Simvastatin) .... Take 1 tablet by mouth at bedtime 7)  Toprol Xl 25 Mg Tb24 (Metoprolol succinate) .... One-half by mouth daily 8)  Aspirin Ec 81 Mg Tbec (Aspirin) .... Take 1 tablet by mouth once a day currently on hold 9)  Advair Diskus 500-50 Mcg/dose Misc (Fluticasone-salmeterol) .... Inhale 1 puff once daily 10)  Proair Hfa 108 (90 Base) Mcg/act Aers (Albuterol sulfate) .... 2 puffs four times a day as needed rescue inhaler 11)  Allergy Vaccine Gh 1:10  .... Once weekly 12)  Accolate 20 Mg Tabs (Zafirlukast) .... Take 2 tablet by mouth every morning 13)  Citrucel Powd (Methylcellulose (laxative)) .... As directed 14)  Xopenex 1.25 Mg/53ml Nebu (Levalbuterol hcl) .Marland Kitchen.. 1 neb three times a day as needed asthma 15)  Nebulizer Machine  .... As directed for asthma 16)  Cpap "6.8" Advanced  17)  Zolpidem Tartrate 5 Mg Tabs (Zolpidem tartrate) .... 1/2-1 by mouth at night as needed 18)  Nasal  Relief 0.05 % Soln (Oxymetazoline hcl) .... Uad 19)  Fluticasone Propionate 50 Mcg/act Susp (Fluticasone propionate) .... 2 sprays each nostril once every day

## 2010-02-23 NOTE — Progress Notes (Signed)
Summary: [facility] F/U    Phone Note Call from Patient Call back at [facility] Phone [phone]   Caller: Patient Call For: Dr. Juanda Chance Reason for Call: Talk to Nurse Summary of Call: pt was released from hosp. on 05-09-09 w/diverticulitis flare up. Needs an appt. ASAP Initial call taken by: Karna Christmas,  May 11, 2009 12:07 PM  Follow-up for Phone Call        Pt. was D/C from [facility] 05-09-09 and told to see Dr.Romari Gasparro, he was instructed to hold his ASA & Plavix until then. Pt. can see Willette Cluster NP on 05-12-09.  Message left for patient to callback.   Follow-up by: Laureen Ochs LPN,  May 11, 2009 12:26 PM  Additional Follow-up for Phone Call Additional follow up Details #1::        Spoke with pt. wife, she scheduled pt. to see Willette Cluster NP on 05-12-09 at 3:30pm. Additional Follow-up by: Laureen Ochs LPN,  May 11, 2009 1:59 PM    Additional Follow-up for Phone Call Additional follow up Details #2::    rviewed and agree. Follow-up by: Hart Carwin MD,  May 11, 2009 6:21 PM

## 2010-02-23 NOTE — Assessment & Plan Note (Signed)
Summary: fup labs//ccm   Vital Signs:  Patient profile:   75 year old male Weight:      184 pounds Temp:     97.7 degrees F oral BP sitting:   124 / 84  (left arm)  Vitals Entered By: Kern Reap CMA Duncan Dull) (October 07, 2009 10:09 AM) CC: follow-up visit Is Patient Diabetic? No Pain Assessment Patient in pain? no        Primary Care Provider:  S[facility]  CC:  follow-up visit.  History of Present Illness:  Follow-Up Visit      This is a 75 year old man who presents for Follow-up visit.  The patient denies chest pain and palpitations.  Since the last visit the patient notes no new problems or concerns.  The patient reports taking meds as prescribed.  When questioned about possible medication side effects, the patient notes none.    All other systems reviewed and were negative   Current Medications (verified): 1)  Tambocor 50 Mg Tabs (Flecainide Acetate) .... Take 1 By Mouth  Two Times A Day 2)  Altace 2.5 Mg Tabs (Ramipril) .... Once Daily 3)  Norvasc 5 Mg  Tabs (Amlodipine Besylate) .... One-Half By Mouth Daily 4)  Pantoprazole Sodium 40 Mg Tbec (Pantoprazole Sodium) .... Take 1 Tablet By Mouth Once A Day 5)  Plavix 75 Mg  Tabs (Clopidogrel Bisulfate) .... One By Mouth Daily 6)  Simvastatin 40 Mg Tabs (Simvastatin) .... Take 1 Tablet By Mouth At Bedtime 7)  Toprol Xl 25 Mg  Tb24 (Metoprolol Succinate) .... One-Half By Mouth Daily 8)  Aspirin Ec 81 Mg  Tbec (Aspirin) .... Take 1 Tablet By Mouth Once A Day 9)  Advair Diskus 500-50 Mcg/dose  Misc (Fluticasone-Salmeterol) .... Inhale 1 Puff Once Daily 10)  Proair Hfa 108 (90 Base) Mcg/act Aers (Albuterol Sulfate) .... 2 Puffs Four Times A Day As Needed Rescue Inhaler 11)  Allergy Vaccine Gh 1:10 .... Once Weekly 12)  Accolate 20 Mg Tabs (Zafirlukast) .... Take 2 Tablet By Mouth Every Morning 13)  Citrucel  Powd (Methylcellulose (Laxative)) .... As Directed 14)  Xopenex 1.25 Mg/15ml Nebu (Levalbuterol Hcl) .Marland Kitchen.. 1 Neb Three  Times A Day As Needed Asthma 15)  Nebulizer Machine .... As Directed For Asthma 16)  Cpap  "6.8" Advanced 17)  Zolpidem Tartrate 5 Mg  Tabs (Zolpidem Tartrate) .... 1/2-1 By Mouth At Night As Needed 18)  Nasal Relief 0.05 % Soln (Oxymetazoline Hcl) .... Uad 19)  Fluticasone Propionate 50 Mcg/act Susp (Fluticasone Propionate) .... 2 Sprays Each Nostril Once Every Day 20)  Nasonex 50 Mcg/act Susp (Mometasone Furoate) .Marland Kitchen.. 1-2 Puffs Each Nostril Once Daily 21)  Proair Hfa 108 (90 Base) Mcg/act Aers (Albuterol Sulfate) .... 2 Puffs Four Times A Day As Needed Rescue Inhaler 22)  Carafate 1 Gm/50ml Susp (Sucralfate) .Marland Kitchen.. 10 Cc By Mouth Two Times A Day  Allergies (verified): No Known Drug Allergies  Past History:  Past Medical History: Last updated: 10/01/2009 DIVERTICULOSIS, COLON (ICD-562.10) Hx of COLITIS, ACUTE (ICD-558.9) COLONIC POLYPS, ADENOMATOUS, HX OF (ICD-V12.72) GERD (ICD-530.81) HYPERLIPIDEMIA (ICD-272.4) ESOPHAGEAL STRICTURE (ICD-530.3) UNSPECIFIED HEMORRHAGE OF GASTROINTESTINAL TRACT (ICD-578.9) BACK PAIN (ICD-724.5) UPPER RESPIRATORY INFECTION (ICD-465.9) ALCOHOL ABUSE (ICD-305.00) MRI, BRAIN, ABNORMAL (ICD-794.09) HEADACHE (ICD-784.0) UNSPECIFIED DENTOFACIAL ANOMALIES (ICD-524.9) HAND PAIN (ICD-729.5) ALLERGIC CONJUNCTIVITIS (ICD-372.14) ALLERGIC RHINITIS (ICD-477.9) FAMILY HISTORY OF ALCOHOLISM/ADDICTION (ICD-V61.41) PHYSICAL EXAMINATION (ICD-V70.0) TRANSIENT ISCHEMIC ATTACKS, HX OF (ICD-V12.50) HYPERGLYCEMIA (ICD-790.29) MYOCARDIAL INFARCTION, HX OF (ICD-412) HYPERTENSION (ICD-401.9) CORONARY ARTERY DISEASE (ICD-414.00) PROSTATE CANCER, HX OF (ICD-V10.46) ATRIAL FIBRILLATION (  ICD-427.31) ASTHMA (ICD-493.90) HIP PAIN, LEFT (ICD-719.45) SHOULDER PAIN, LEFT (ICD-719.41) Obstructive Sleep Apnea        - s/p UPPP surgery  Past Surgical History: Last updated: 05/12/2009 UPPP Tonsillectomy,adenoidectomy knee surgery X 3--arthroscopy-bilateral (2  surgeries-right, 1 surgery-left) Prostatectomy  02/02, prostate ca  Family History: Last updated: 02/14/2008 Family History of Alcoholism/Addiction Family History of Heart Disease: Brother, Father  Social History: Last updated: 05/12/2009 Occupation: Museum/gallery conservator paper and Engineer, petroleum Married Alcohol use-yes-currently on hold Regular exercise-yes---walking regularly (2010) Patient is a former smoker. -stopped age 8 Illicit Drug Use - no  Risk Factors: Alcohol Use: 4+ (09/03/2009) Exercise: yes (02/18/2008)  Risk Factors: Smoking Status: quit > 6 months (09/03/2009)  Physical Exam  General:  alert and well-developed.   Head:  atraumatic.   Ears:  R ear normal and L ear normal.   Neck:  No deformities, masses, or tenderness noted. Lungs:  normal respiratory effort and no intercostal retractions.   Abdomen:  soft and non-tender.   Skin:  turgor normal and color normal.   Psych:  good eye contact and not anxious appearing.     Impression & Recommendations:  Problem # 1:  OTHER TESTICULAR HYPOFUNCTION (ICD-257.2) discussed not a candidate for testostereone replacement given prostate CA  Complete Medication List: 1)  Tambocor 50 Mg Tabs (Flecainide acetate) .... Take 1 by mouth  two times a day 2)  Altace 2.5 Mg Tabs (Ramipril) .... Once daily 3)  Norvasc 5 Mg Tabs (Amlodipine besylate) .... One-half by mouth daily 4)  Pantoprazole Sodium 40 Mg Tbec (Pantoprazole sodium) .... Take 1 tablet by mouth once a day 5)  Plavix 75 Mg Tabs (Clopidogrel bisulfate) .... One by mouth daily 6)  Simvastatin 40 Mg Tabs (Simvastatin) .... Take 1 tablet by mouth at bedtime 7)  Toprol Xl 25 Mg Tb24 (Metoprolol succinate) .... One-half by mouth daily 8)  Aspirin Ec 81 Mg Tbec (Aspirin) .... Take 1 tablet by mouth once a day 9)  Advair Diskus 500-50 Mcg/dose Misc (Fluticasone-salmeterol) .... Inhale 1 puff once daily 10)  Proair Hfa 108 (90 Base) Mcg/act Aers (Albuterol sulfate) .... 2  puffs four times a day as needed rescue inhaler 11)  Allergy Vaccine Gh 1:10  .... Once weekly 12)  Accolate 20 Mg Tabs (Zafirlukast) .... Take 2 tablet by mouth every morning 13)  Citrucel Powd (Methylcellulose (laxative)) .... As directed 14)  Xopenex 1.25 Mg/47ml Nebu (Levalbuterol hcl) .Marland Kitchen.. 1 neb three times a day as needed asthma 15)  Nebulizer Machine  .... As directed for asthma 16)  Cpap "6.8" Advanced  17)  Zolpidem Tartrate 5 Mg Tabs (Zolpidem tartrate) .... 1/2-1 by mouth at night as needed 18)  Nasal Relief 0.05 % Soln (Oxymetazoline hcl) .... Uad 19)  Fluticasone Propionate 50 Mcg/act Susp (Fluticasone propionate) .... 2 sprays each nostril once every day 20)  Nasonex 50 Mcg/act Susp (Mometasone furoate) .Marland Kitchen.. 1-2 puffs each nostril once daily 21)  Proair Hfa 108 (90 Base) Mcg/act Aers (Albuterol sulfate) .... 2 puffs four times a day as needed rescue inhaler 22)  Carafate 1 Gm/57ml Susp (Sucralfate) .Marland Kitchen.. 10 cc by mouth two times a day  Patient Instructions: 1)  Please schedule a follow-up appointment in 3 months.

## 2010-02-23 NOTE — Progress Notes (Signed)
Summary: VOMITING/ COUGHING  Phone Note Call from Patient   Caller: Patient Call For: Corey [name] Summary of Call: PT WAS CHOKING AND VOMITING UP ORANGE AND COUGHING LAST NIGHT. IT LEFT A BAD TASTE IN HIS MOUTH. DR Corey [name] TOLD HIM TO CALL AND GET MESSAGE TO HIM IF HE GOT WORSE. Initial call taken by: Rickard Patience,  October 02, 2009 8:15 AM  Follow-up for Phone Call        Spoke with pt.  He states that he woke up at 2:15 am this morning and felt like he was choking, could not swallow and states that he had a very bad taste in mouth.  He states that then he started to cough and then vomited up some "stringy orange stuff".  Was seen yesterday and has ov pending in Feb 2012.  Pls advise thanks NKDA Follow-up by: Vernie Murders,  October 02, 2009 9:01 AM  Additional Follow-up for Phone Call Additional follow up Details #1::        Per CDY-direct this message to Lina Sar; this was a reflux event.Reynaldo Minium CMA  October 02, 2009 12:09 PM  Additional Follow-up by: Hart Carwin MD,  October 04, 2009 3:49 PM    Additional Follow-up for Phone Call Additional follow up Details #2::    I spoke with the patient and he reports that he is fine now.  Last night he had an episode of reflux followed by vomiting.  His troat is sore today and he is having to clear his throat.  Reviewed with the patient he is on  protonix and I have advised him to increase to two times a day x 1 week.  Pt instructed to maintain an anti-reflux diet. Advised to avoid caffeine, mint, citrus foods/juices, tomatoes, chocolate. Instructed not to eat within 2 hours of exercise or bed, multiple small meals are better than 3 large meals. Need to take PPI 30 minutes prior to 1st meal of the day and at bedtime.   Dr Juanda Chance can we send him in some carafate? Follow-up by: Darcey Nora RN, CGRN,  October 02, 2009 3:39 PM  Additional Follow-up for Phone Call Additional follow up Details #3:: Details for Additional Follow-up Action  Taken: reviewed and agree. OK to send Carafate slurry 10cc by mouth two times a day, 10oz, Additional Follow-up by: Hart Carwin MD,  October 02, 2009 9:35 PM  New/Updated Medications: CARAFATE 1 GM/10ML SUSP (SUCRALFATE) 10 cc by mouth two times a day Prescriptions: CARAFATE 1 GM/10ML SUSP (SUCRALFATE) 10 cc by mouth two times a day  #10 oz x 1   Entered by:   Darcey Nora RN, CGRN   Authorized by:   Hart Carwin MD   Signed by:   Darcey Nora RN, CGRN on 10/05/2009   Method used:   Electronically to        [facility] (retail)       [address]       [place], [place]  [id number]       Ph: [phone] or [phone]       Fax: [phone]   RxID:   [id number]  patient aware, he reports no further pain or problems.  He is advised to pick up RX and he can use it as needed if he has another episode. Patient verbalized understanding of instructions.   Darcey Nora RN, [facility]  October 05, 2009 8:29 AM

## 2010-02-23 NOTE — Letter (Signed)
Summary: [facility]  [facility]   Imported By: Sherian Rein 06/11/2009 14:07:40  _____________________________________________________________________  External Attachment:    Type:   Image     Comment:   External Document

## 2010-02-23 NOTE — Assessment & Plan Note (Signed)
Summary: fup hosp//ccm   Vital Signs:  Patient profile:   75 year old male Weight:      184 pounds Temp:     98.1 degrees F oral Pulse rate:   52 / minute Pulse rhythm:   irregular Resp:     12 per minute BP sitting:   120 / 66  (left arm) Cuff size:   regular  Vitals Entered By: Gladis Riffle, RN (May 13, 2009 11:10 AM) CC: hospital FU hematemesis and acute renal failure with dehydration--c/o feeling weak Is Patient Diabetic? No Comments HCTZ stopped due to renal failure   Primary Care Provider:  Aries Townley  CC:  hospital FU hematemesis and acute renal failure with dehydration--c/o feeling weak.  History of Present Illness:  Follow-Up Visit      This is a 75 year old man who presents for Follow-up visit.  The patient denies chest pain and palpitations.  Since the last visit the patient notes a recent hospitilization and being seen by a specialist.  The patient reports taking meds as prescribed.  When questioned about possible medication side effects, the patient notes none.  had a normal Bm today  recovering and gaining strength slowly  All other systems reviewed and were negative   Preventive Screening-Counseling & Management  Alcohol-Tobacco     Alcohol drinks/day: 4+     Smoking Status: quit > 6 months     Year Started: 1960     Year Quit: 1966     Pack years: 6 years  1/2 pack  Current Problems (verified): 1)  Gastroenteritis  (ICD-558.9) 2)  Diarrhea  (ICD-787.91) 3)  Rhinitis Medicamentosa  (ICD-472.0) 4)  Allergic Rhinitis  (ICD-477.9) 5)  Asthma  (ICD-493.90) 6)  Insomnia-sleep Disorder-unspec  (ICD-780.52) 7)  Obstructive Sleep Apnea  (ICD-327.23) 8)  Alcohol Abuse, Episodic  (ICD-305.02) 9)  Diverticulosis, Colon  (ICD-562.10) 10)  Colonic Polyps, Adenomatous, Hx of  (ICD-V12.72) 11)  Gerd  (ICD-530.81) 12)  Hyperlipidemia  (ICD-272.4) 13)  Esophageal Stricture  (ICD-530.3) 14)  Transient Ischemic Attacks, Hx of  (ICD-V12.50) 15)  Hyperglycemia   (ICD-790.29) 16)  Myocardial Infarction, Hx of  (ICD-412) 17)  Hypertension  (ICD-401.9) 18)  Coronary Artery Disease  (ICD-414.00) 19)  Prostate Cancer, Hx of  (ICD-V10.46) 20)  Atrial Fibrillation  (ICD-427.31)  Current Medications (verified): 1)  Tambocor 50 Mg Tabs (Flecainide Acetate) .... Take 1 By Mouth  Two Times A Day 2)  Altace 2.5 Mg Tabs (Ramipril) .... Once Daily 3)  Norvasc 5 Mg  Tabs (Amlodipine Besylate) .... One-Half By Mouth Daily 4)  Pantoprazole Sodium 40 Mg Tbec (Pantoprazole Sodium) .... Take 1 Tablet By Mouth Two Times A Day 5)  Plavix 75 Mg  Tabs (Clopidogrel Bisulfate) .... One By Mouth Daily Currently On Hold! 6)  Simvastatin 40 Mg Tabs (Simvastatin) .... Take 1 Tablet By Mouth At Bedtime 7)  Toprol Xl 25 Mg  Tb24 (Metoprolol Succinate) .... One-Half By Mouth Daily 8)  Aspirin Ec 81 Mg  Tbec (Aspirin) .... Take 1 Tablet By Mouth Once A Day Currently On Hold 9)  Advair Diskus 500-50 Mcg/dose  Misc (Fluticasone-Salmeterol) .... Inhale 1 Puff Once Daily 10)  Proair Hfa 108 (90 Base) Mcg/act Aers (Albuterol Sulfate) .... 2 Puffs Four Times A Day As Needed Rescue Inhaler 11)  Allergy Vaccine Gh 1:10 .... Once Weekly 12)  Accolate 20 Mg Tabs (Zafirlukast) .... Take 2 Tablet By Mouth Every Morning 13)  Citrucel  Powd (Methylcellulose (Laxative)) .... As Directed 14)  Xopenex 1.25 Mg/26ml Nebu (Levalbuterol Hcl) .Marland Kitchen.. 1 Neb Three Times A Day As Needed Asthma 15)  Nebulizer Machine .... As Directed For Asthma 16)  Cpap  "6.8" Advanced 17)  Zolpidem Tartrate 5 Mg  Tabs (Zolpidem Tartrate) .... 1/2-1 By Mouth At Night As Needed 18)  Nasal Relief 0.05 % Soln (Oxymetazoline Hcl) .... Uad 19)  Fluticasone Propionate 50 Mcg/act Susp (Fluticasone Propionate) .... 2 Sprays Each Nostril Once Every Day 20)  Oyster Shell Calcium 500 + D 500-125 Mg-Unit Tabs (Calcium Carbonate-Vitamin D) .... Take 1 Tablet By Mouth Three Times A Day 21)  Ciprofloxacin Hcl 500 Mg Tabs (Ciprofloxacin  Hcl) .... Take 1 Tablet By Mouth Two Times A Day 22)  Flagyl 500 Mg Tabs (Metronidazole) .... Take 1 Tablet By Mouth Three Times A Day  Allergies (verified): 1)  Sulfamethoxazole (Sulfamethoxazole)  Physical Exam  General:  alert and well-developed.   Head:  normocephalic and atraumatic.   Eyes:  pupils equal and pupils round.   Ears:  R ear normal and L ear normal.   Neck:  Supple; no masses or thyromegaly. Lungs:  normal respiratory effort and no intercostal retractions.   Heart:  normal rate and regular rhythm.   Abdomen:  Bowel sounds positive,abdomen soft and non-tender without masses, organomegaly or hernias noted. Skin:  turgor normal and color normal.   Cervical Nodes:  no anterior cervical adenopathy and no posterior cervical adenopathy.   Psych:  normally interactive and good eye contact.     Impression & Recommendations:  Problem # 1:  GASTROENTERITIS (ICD-558.9) sxs resolving no further treatment will complete antibiotics (suspect this was all viral gastroenteritis)  reviewed hospital labs and CT abdomen and pelvis  Problem # 2:  HYPERTENSION (ICD-401.9) stay off hctz The following medications were removed from the medication list:    Hydrochlorothiazide 25 Mg Tabs (Hydrochlorothiazide) ..... One-half by mouth daily His updated medication list for this problem includes:    Altace 2.5 Mg Tabs (Ramipril) ..... Once daily    Norvasc 5 Mg Tabs (Amlodipine besylate) ..... One-half by mouth daily    Toprol Xl 25 Mg Tb24 (Metoprolol succinate) ..... One-half by mouth daily  Problem # 3:  GERD (ICD-530.81) decrease protonix back to once daily  His updated medication list for this problem includes:    Pantoprazole Sodium 40 Mg Tbec (Pantoprazole sodium) .Marland Kitchen... Take 1 tablet by mouth once a day  Complete Medication List: 1)  Tambocor 50 Mg Tabs (Flecainide acetate) .... Take 1 by mouth  two times a day 2)  Altace 2.5 Mg Tabs (Ramipril) .... Once daily 3)  Norvasc 5 Mg  Tabs (Amlodipine besylate) .... One-half by mouth daily 4)  Pantoprazole Sodium 40 Mg Tbec (Pantoprazole sodium) .... Take 1 tablet by mouth once a day 5)  Plavix 75 Mg Tabs (Clopidogrel bisulfate) .... One by mouth daily 6)  Simvastatin 40 Mg Tabs (Simvastatin) .... Take 1 tablet by mouth at bedtime 7)  Toprol Xl 25 Mg Tb24 (Metoprolol succinate) .... One-half by mouth daily 8)  Aspirin Ec 81 Mg Tbec (Aspirin) .... Take 1 tablet by mouth once a day currently on hold 9)  Advair Diskus 500-50 Mcg/dose Misc (Fluticasone-salmeterol) .... Inhale 1 puff once daily 10)  Proair Hfa 108 (90 Base) Mcg/act Aers (Albuterol sulfate) .... 2 puffs four times a day as needed rescue inhaler 11)  Allergy Vaccine Gh 1:10  .... Once weekly 12)  Accolate 20 Mg Tabs (Zafirlukast) .... Take 2 tablet by mouth every morning  13)  Citrucel Powd (Methylcellulose (laxative)) .... As directed 14)  Xopenex 1.25 Mg/41ml Nebu (Levalbuterol hcl) .Marland Kitchen.. 1 neb three times a day as needed asthma 15)  Nebulizer Machine  .... As directed for asthma 16)  Cpap "6.8" Advanced  17)  Zolpidem Tartrate 5 Mg Tabs (Zolpidem tartrate) .... 1/2-1 by mouth at night as needed 18)  Nasal Relief 0.05 % Soln (Oxymetazoline hcl) .... Uad 19)  Fluticasone Propionate 50 Mcg/act Susp (Fluticasone propionate) .... 2 sprays each nostril once every day 20)  Ciprofloxacin Hcl 500 Mg Tabs (Ciprofloxacin hcl) .... Take 1 tablet by mouth two times a day 21)  Flagyl 500 Mg Tabs (Metronidazole) .... Take 1 tablet by mouth three times a day  Patient Instructions: 1)  see me 2 weeks   CT of Abdomen  Procedure date:  05/08/2009  Findings:         Findings: Linear bibasilar plate-like atelectasis is noted.  5 mm   too small to characterize right upper renal pole cortical   hypodensity is noted.  2 mm nonobstructing left mid renal calculus   is seen.  Splenic calcifications likely indicate prior   granulomatous disease.  The liver, gallbladder, adrenal  glands, and   atrophic pancreas are normal.  No ascites or lymphadenopathy.    Pelvic clips are noted.  The bladder is decompressed and mildly   thick-walled, likely due to underdistension.  Colonic   diverticulosis noted without evidence for diverticulitis.  A few   nonspecific loops of small bowel are noted but no small bowel   dilatation of or wall thickening is seen.  The appendix is normal.   Atherosclerotically calcified non-aneurysmal aorta and iliac   vessels noted.  No pelvic free fluid or lymphadenopathy.  Lumbar   spine degenerative changes present.  No other bony abnormality.    IMPRESSION:   No acute intra-abdominal    Read By:  Harrel Lemon,  MD   Released By:  Harrel Lemon,  MD  Additional Information  [id number] RESULT STATUS : F  External image : [id number]  External IF Update Timestamp : 2009-05-08:18:43:[id number]

## 2010-02-23 NOTE — Assessment & Plan Note (Signed)
Summary: ROA/LABFUP/RCD   Vital Signs:  Patient profile:   75 year old male Weight:      183 pounds Temp:     97.5 degrees F Pulse rate:   48 / minute Pulse rhythm:   regular BP sitting:   134 / 80  (left arm) Cuff size:   regular  Vitals Entered By: Kern Reap CMA Duncan Dull) (September 29, 2009 8:19 AM) CC: follow-up visit Is Patient Diabetic? No   Primary Care Provider:  Swords  CC:  follow-up visit.  History of Present Illness: f/u testosterone---note low see a/p  Current Medications (verified): 1)  Tambocor 50 Mg Tabs (Flecainide Acetate) .... Take 1 By Mouth  Two Times A Day 2)  Altace 2.5 Mg Tabs (Ramipril) .... Once Daily 3)  Norvasc 5 Mg  Tabs (Amlodipine Besylate) .... One-Half By Mouth Daily 4)  Pantoprazole Sodium 40 Mg Tbec (Pantoprazole Sodium) .... Take 1 Tablet By Mouth Once A Day 5)  Plavix 75 Mg  Tabs (Clopidogrel Bisulfate) .... One By Mouth Daily 6)  Simvastatin 40 Mg Tabs (Simvastatin) .... Take 1 Tablet By Mouth At Bedtime 7)  Toprol Xl 25 Mg  Tb24 (Metoprolol Succinate) .... One-Half By Mouth Daily 8)  Aspirin Ec 81 Mg  Tbec (Aspirin) .... Take 1 Tablet By Mouth Once A Day 9)  Advair Diskus 500-50 Mcg/dose  Misc (Fluticasone-Salmeterol) .... Inhale 1 Puff Once Daily 10)  Proair Hfa 108 (90 Base) Mcg/act Aers (Albuterol Sulfate) .... 2 Puffs Four Times A Day As Needed Rescue Inhaler 11)  Allergy Vaccine Gh 1:10 .... Once Weekly 12)  Accolate 20 Mg Tabs (Zafirlukast) .... Take 2 Tablet By Mouth Every Morning 13)  Citrucel  Powd (Methylcellulose (Laxative)) .... As Directed 14)  Xopenex 1.25 Mg/61ml Nebu (Levalbuterol Hcl) .Marland Kitchen.. 1 Neb Three Times A Day As Needed Asthma 15)  Nebulizer Machine .... As Directed For Asthma 16)  Cpap  "6.8" Advanced 17)  Zolpidem Tartrate 5 Mg  Tabs (Zolpidem Tartrate) .... 1/2-1 By Mouth At Night As Needed 18)  Nasal Relief 0.05 % Soln (Oxymetazoline Hcl) .... Uad 19)  Fluticasone Propionate 50 Mcg/act Susp (Fluticasone  Propionate) .... 2 Sprays Each Nostril Once Every Day 20)  Nasonex 50 Mcg/act Susp (Mometasone Furoate) .Marland Kitchen.. 1-2 Puffs Each Nostril Once Daily 21)  Proair Hfa 108 (90 Base) Mcg/act Aers (Albuterol Sulfate) .... 2 Puffs Four Times A Day As Needed Rescue Inhaler   Impression & Recommendations:  Problem # 1:  FATIGUE (ICD-780.79) could be testosterone lated  Problem # 2:  OTHER TESTICULAR HYPOFUNCTION (ICD-257.2) discussed at length trial testosterone replacement would be reasonable except for hx of prostate CA will discuss with dr. Aldean Ast.  discussed risks and benefits with patient total time 15 minutes.Marland Kitchen  all  FTF  Complete Medication List: 1)  Tambocor 50 Mg Tabs (Flecainide acetate) .... Take 1 by mouth  two times a day 2)  Altace 2.5 Mg Tabs (Ramipril) .... Once daily 3)  Norvasc 5 Mg Tabs (Amlodipine besylate) .... One-half by mouth daily 4)  Pantoprazole Sodium 40 Mg Tbec (Pantoprazole sodium) .... Take 1 tablet by mouth once a day 5)  Plavix 75 Mg Tabs (Clopidogrel bisulfate) .... One by mouth daily 6)  Simvastatin 40 Mg Tabs (Simvastatin) .... Take 1 tablet by mouth at bedtime 7)  Toprol Xl 25 Mg Tb24 (Metoprolol succinate) .... One-half by mouth daily 8)  Aspirin Ec 81 Mg Tbec (Aspirin) .... Take 1 tablet by mouth once a day 9)  Advair  Diskus 500-50 Mcg/dose Misc (Fluticasone-salmeterol) .... Inhale 1 puff once daily 10)  Proair Hfa 108 (90 Base) Mcg/act Aers (Albuterol sulfate) .... 2 puffs four times a day as needed rescue inhaler 11)  Allergy Vaccine Gh 1:10  .... Once weekly 12)  Accolate 20 Mg Tabs (Zafirlukast) .... Take 2 tablet by mouth every morning 13)  Citrucel Powd (Methylcellulose (laxative)) .... As directed 14)  Xopenex 1.25 Mg/89ml Nebu (Levalbuterol hcl) .Marland Kitchen.. 1 neb three times a day as needed asthma 15)  Nebulizer Machine  .... As directed for asthma 16)  Cpap "6.8" Advanced  17)  Zolpidem Tartrate 5 Mg Tabs (Zolpidem tartrate) .... 1/2-1 by mouth at night  as needed 18)  Nasal Relief 0.05 % Soln (Oxymetazoline hcl) .... Uad 19)  Fluticasone Propionate 50 Mcg/act Susp (Fluticasone propionate) .... 2 sprays each nostril once every day 20)  Nasonex 50 Mcg/act Susp (Mometasone furoate) .Marland Kitchen.. 1-2 puffs each nostril once daily 21)  Proair Hfa 108 (90 Base) Mcg/act Aers (Albuterol sulfate) .... 2 puffs four times a day as needed rescue inhaler    Appended Document: ROA/LABFUP/RCD total time 15 minutes > 1/2 spent counseling patient

## 2010-02-24 ENCOUNTER — Encounter: Payer: Self-pay | Admitting: Internal Medicine

## 2010-02-24 DIAGNOSIS — J301 Allergic rhinitis due to pollen: Secondary | ICD-10-CM

## 2010-02-25 NOTE — Progress Notes (Signed)
Summary: Triage   Phone Note Call from Patient Call back at [phone]   Caller: Patient Call For: Dr. Juanda Chance Reason for Call: Talk to Nurse Summary of Call: Pt wants to speak with nurse about shots that another doctor is giving him, he was told to go off plavix for 5 days and wants to check this with [place] before he stops taking plavix Initial call taken by: [place] Johnson,  February 18, 2010 2:47 PM  Follow-up for Phone Call        Spoke with patient. He had gotten his Plavis from Dr. Alanda Amass and he will call them about holding it for a procedure with the Orthopedic MD. Follow-up by: Jesse Fall RN,  February 18, 2010 3:25 PM

## 2010-02-25 NOTE — Assessment & Plan Note (Signed)
Summary: 3 month rov/njr   Vital Signs:  Patient profile:   75 year old male Weight:      185 pounds Temp:     97.7 degrees F oral Pulse rate:   64 / minute Pulse rhythm:   regular BP sitting:   132 / 84  (left arm) Cuff size:   regular  Vitals Entered By: Alfred Levins, CMA (January 06, 2010 10:42 AM) CC: f/u   Primary Care Provider:  Swords  CC:  f/u.  History of Present Illness: he drank a lot of alcohol this weekend: Saturday a.m. had significant abdominal pain and hangover. Had significant diarrhea and vomiting. sxs resolved after 6 hours. no fever or chills.  SXS have resolved.  since that time has done well and has maintained hydration.  Cardiac: followed by [facility]    All other systems reviewed and were negative   Current Medications (verified): 1)  Tambocor 50 Mg Tabs (Flecainide Acetate) .... Take 1 By Mouth  Two Times A Day 2)  Altace 2.5 Mg Tabs (Ramipril) .... Once Daily 3)  Norvasc 5 Mg  Tabs (Amlodipine Besylate) .... One-Half By Mouth Daily 4)  Pantoprazole Sodium 40 Mg Tbec (Pantoprazole Sodium) .... Take 1 Tablet By Mouth Once A Day 5)  Plavix 75 Mg  Tabs (Clopidogrel Bisulfate) .... One By Mouth Daily 6)  Simvastatin 40 Mg Tabs (Simvastatin) .... Take 1 Tablet By Mouth At Bedtime 7)  Toprol Xl 25 Mg  Tb24 (Metoprolol Succinate) .... One-Half By Mouth Daily 8)  Aspirin Ec 81 Mg  Tbec (Aspirin) .... Take 1 Tablet By Mouth Once A Day 9)  Advair Diskus 500-50 Mcg/dose  Misc (Fluticasone-Salmeterol) .... Inhale 1 Puff Once Daily 10)  Allergy Vaccine Gh 1:10 .... Once Weekly 11)  Accolate 20 Mg Tabs (Zafirlukast) .... Take 2 Tablet By Mouth Every Morning 12)  Citrucel  Powd (Methylcellulose (Laxative)) .... As Directed 13)  Xopenex 1.25 Mg/96ml Nebu (Levalbuterol Hcl) .Marland Kitchen.. 1 Neb Three Times A Day As Needed Asthma 14)  Nebulizer Machine .... As Directed For Asthma 15)  Cpap  "6.8" Advanced 16)  Zolpidem Tartrate 5 Mg  Tabs (Zolpidem Tartrate)  .... 1/2-1 By Mouth At Night As Needed 17)  Nasonex 50 Mcg/act Susp (Mometasone Furoate) .Marland Kitchen.. 1-2 Puffs Each Nostril Once Daily 18)  Proair Hfa 108 (90 Base) Mcg/act Aers (Albuterol Sulfate) .... 2 Puffs Four Times A Day As Needed Rescue Inhaler  Allergies (verified): No Known Drug Allergies  Physical Exam  General:  well-developed well-nourished male in no acute distress. HEENT exam atraumatic, normocephalic symmetric her muscles are intact. Neck is supple. Chest clear to auscultation cardiac exam S1-S2 are regular. Abdominal exam active bowel sounds, soft, nontender.   Impression & Recommendations:  Problem # 1:  ABDOMINAL PAIN (ICD-789.00) self resolving. I don't think any further evaluation necessary. I will check some laboratories to make sure he is not dehydrated. Orders: TLB-CBC Platelet - w/Differential (85025-CBCD) TLB-Hepatic/Liver Function Pnl (80076-HEPATIC)  Problem # 2:  ALCOHOL ABUSE, EPISODIC (ICD-305.02) I've advised him to abstain from all alcohol.  Problem # 3:  HYPERTENSION (ICD-401.9) controlled. Continue current medications. His updated medication list for this problem includes:    Altace 2.5 Mg Tabs (Ramipril) ..... Once daily    Norvasc 5 Mg Tabs (Amlodipine besylate) ..... One-half by mouth daily    Toprol Xl 25 Mg Tb24 (Metoprolol succinate) ..... One-half by mouth daily  BP today: 132/84 Prior BP: 124/84 (10/07/2009)  Labs Reviewed: K+: 3.7 (08/26/2009)  Creat: : 1.0 (08/26/2009)   Chol: 168 (12/29/2008)   HDL: 58.60 (12/29/2008)   LDL: 91 (12/29/2008)   TG: 93.0 (12/29/2008)  Complete Medication List: 1)  Tambocor 50 Mg Tabs (Flecainide acetate) .... Take 1 by mouth  two times a day 2)  Altace 2.5 Mg Tabs (Ramipril) .... Once daily 3)  Norvasc 5 Mg Tabs (Amlodipine besylate) .... One-half by mouth daily 4)  Pantoprazole Sodium 40 Mg Tbec (Pantoprazole sodium) .... Take 1 tablet by mouth once a day 5)  Plavix 75 Mg Tabs (Clopidogrel bisulfate) ....  One by mouth daily 6)  Simvastatin 40 Mg Tabs (Simvastatin) .... Take 1 tablet by mouth at bedtime 7)  Toprol Xl 25 Mg Tb24 (Metoprolol succinate) .... One-half by mouth daily 8)  Aspirin Ec 81 Mg Tbec (Aspirin) .... Take 1 tablet by mouth once a day 9)  Advair Diskus 500-50 Mcg/dose Misc (Fluticasone-salmeterol) .... Inhale 1 puff once daily 10)  Allergy Vaccine Gh 1:10  .... Once weekly 11)  Accolate 20 Mg Tabs (Zafirlukast) .... Take 2 tablet by mouth every morning 12)  Citrucel Powd (Methylcellulose (laxative)) .... As directed 13)  Xopenex 1.25 Mg/7ml Nebu (Levalbuterol hcl) .Marland Kitchen.. 1 neb three times a day as needed asthma 14)  Nebulizer Machine  .... As directed for asthma 15)  Cpap "6.8" Advanced  16)  Zolpidem Tartrate 5 Mg Tabs (Zolpidem tartrate) .... 1/2-1 by mouth at night as needed 17)  Nasonex 50 Mcg/act Susp (Mometasone furoate) .Marland Kitchen.. 1-2 puffs each nostril once daily 18)  Proair Hfa 108 (90 Base) Mcg/act Aers (Albuterol sulfate) .... 2 puffs four times a day as needed rescue inhaler  Other Orders: Venipuncture (95284) TLB-BMP (Basic Metabolic Panel-BMET) (80048-METABOL) TLB-A1C / Hgb A1C (Glycohemoglobin) (83036-A1C)  Patient Instructions: 1)  6 months   Orders Added: 1)  Est. Patient Level IV [13244] 2)  Venipuncture [01027] 3)  TLB-BMP (Basic Metabolic Panel-BMET) [80048-METABOL] 4)  TLB-A1C / Hgb A1C (Glycohemoglobin) [83036-A1C] 5)  TLB-CBC Platelet - w/Differential [85025-CBCD] 6)  TLB-Hepatic/Liver Function Pnl [80076-HEPATIC]  Appended Document: Orders Update     Clinical Lists Changes  Orders: Added new Service order of Specimen Handling (25366) - Signed

## 2010-03-03 ENCOUNTER — Ambulatory Visit: Payer: Self-pay | Admitting: Internal Medicine

## 2010-03-04 ENCOUNTER — Telehealth: Payer: Self-pay | Admitting: Internal Medicine

## 2010-03-04 DIAGNOSIS — J301 Allergic rhinitis due to pollen: Secondary | ICD-10-CM

## 2010-03-11 ENCOUNTER — Ambulatory Visit (INDEPENDENT_AMBULATORY_CARE_PROVIDER_SITE_OTHER): Payer: Medicare Other

## 2010-03-11 ENCOUNTER — Encounter (INDEPENDENT_AMBULATORY_CARE_PROVIDER_SITE_OTHER): Payer: Self-pay | Admitting: *Deleted

## 2010-03-11 DIAGNOSIS — J301 Allergic rhinitis due to pollen: Secondary | ICD-10-CM

## 2010-03-11 NOTE — Progress Notes (Signed)
Summary: nos appt  Phone Note Call from Patient   Caller: [email or web address]  Call For: Arty Lantzy Summary of Call: Rsc nos from 2/8 to 2/20. Initial call taken by: Darletta Moll,  March 04, 2010 9:50 AM

## 2010-03-15 ENCOUNTER — Ambulatory Visit (INDEPENDENT_AMBULATORY_CARE_PROVIDER_SITE_OTHER): Payer: Medicare Other | Admitting: Internal Medicine

## 2010-03-15 ENCOUNTER — Encounter: Payer: Self-pay | Admitting: Internal Medicine

## 2010-03-15 DIAGNOSIS — J45909 Unspecified asthma, uncomplicated: Secondary | ICD-10-CM

## 2010-03-15 DIAGNOSIS — G4700 Insomnia, unspecified: Secondary | ICD-10-CM

## 2010-03-15 DIAGNOSIS — G4730 Sleep apnea, unspecified: Secondary | ICD-10-CM

## 2010-03-15 DIAGNOSIS — J309 Allergic rhinitis, unspecified: Secondary | ICD-10-CM

## 2010-03-17 NOTE — Miscellaneous (Signed)
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

## 2010-03-21 ENCOUNTER — Telehealth: Payer: Self-pay | Admitting: Internal Medicine

## 2010-03-22 ENCOUNTER — Ambulatory Visit (INDEPENDENT_AMBULATORY_CARE_PROVIDER_SITE_OTHER): Payer: Medicare Other | Admitting: Internal Medicine

## 2010-03-22 ENCOUNTER — Encounter: Payer: Self-pay | Admitting: Internal Medicine

## 2010-03-22 VITALS — BP 124/76 | HR 64 | Temp 98.0°F | Wt 189.0 lb

## 2010-03-22 DIAGNOSIS — L299 Pruritus, unspecified: Secondary | ICD-10-CM

## 2010-03-22 LAB — CBC WITH DIFFERENTIAL/PLATELET
Basophils Absolute: 0 10*3/uL (ref 0.0–0.1)
Eosinophils Absolute: 0.1 10*3/uL (ref 0.0–0.7)
Hemoglobin: 14.4 g/dL (ref 13.0–17.0)
Lymphocytes Relative: 16.8 % (ref 12.0–46.0)
Lymphs Abs: 1.4 10*3/uL (ref 0.7–4.0)
MCHC: 34.7 g/dL (ref 30.0–36.0)
Neutro Abs: 6.2 10*3/uL (ref 1.4–7.7)
RDW: 13.8 % (ref 11.5–14.6)

## 2010-03-22 LAB — HEPATIC FUNCTION PANEL
Albumin: 4.1 g/dL (ref 3.5–5.2)
Alkaline Phosphatase: 64 U/L (ref 39–117)

## 2010-03-22 LAB — SEDIMENTATION RATE: Sed Rate: 25 mm/hr — ABNORMAL HIGH (ref 0–22)

## 2010-03-22 MED ORDER — HYDROXYZINE HCL 25 MG PO TABS: 25.0000 mg | ORAL_TABLET | Freq: Three times a day (TID) | ORAL | Status: AC | PRN

## 2010-03-22 MED ORDER — PREDNISONE 20 MG PO TABS: ORAL_TABLET | ORAL | Status: DC

## 2010-03-22 MED ORDER — TRIAMCINOLONE ACETONIDE 0.5 % EX OINT: TOPICAL_OINTMENT | Freq: Two times a day (BID) | CUTANEOUS | Status: DC

## 2010-03-22 NOTE — Progress Notes (Signed)
  Subjective:    Patient ID: Corey [name], male    DOB: November 12, 1935, 75 y.o.   MRN: [id number]  HPI  Diffuse pruritis started Friday. Especially hands and wrists but has had some pruruits of torso and groin. Rash lasts all day but most bothersome at night.  He reports two weeks ago that he had steroid injection to back, no immediate reaction. (relief of back pain).  He has noted one area of rash---large around right axilla.   Past Medical History  Diagnosis Date  . Alcohol abuse, episodic 05/19/2008  . ALLERGIC RHINITIS 04/15/2009  . ASTHMA 11/16/2006  . Atrial fibrillation 11/16/2006  . COLONIC POLYPS, ADENOMATOUS, HX OF 02/14/2008  . CORONARY ARTERY DISEASE 11/16/2006  . DIVERTICULOSIS, COLON 02/14/2008  . ESOPHAGEAL STRICTURE 01/28/2008  . GERD 02/14/2008  . HYPERGLYCEMIA 11/16/2006  . HYPERLIPIDEMIA 02/14/2008  . HYPERTENSION 11/16/2006  . INSOMNIA-SLEEP DISORDER-UNSPEC 02/11/2009  . MYOCARDIAL INFARCTION, HX OF 11/16/2006  . OBSTRUCTIVE SLEEP APNEA 11/10/2008  . 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 PROSTATE CANCER, HX OF 02/25/2000  . SLEEP APNEA 10/03/2009  . TRANSIENT ISCHEMIC ATTACKS, HX OF 11/16/2006   Past Surgical History  Procedure Date  . Uvulopalatopharyngoplasty   . Tonsilectomy, adenoidectomy, bilateral myringotomy and tubes   . Knee arthroscopy     2 surgeries right and 1 left  . Prostate surgery 2/02    prostate cancer    reports that he quit smoking about 46 years ago. His smoking use included Cigarettes. He does not have any smokeless tobacco history on file. He reports that he drinks alcohol. He reports that he does not use illicit drugs. family history includes Heart attack in his brother and father.    No Known Allergies   Review of Systems  patient denies chest pain, shortness of breath, orthopnea. Denies lower extremity edema, abdominal pain, change in appetite, change in bowel movements. Patient denies rashes, musculoskeletal complaints. No other  specific complaints in a complete review of systems.      Objective:   Physical Exam  well-developed well-nourished male in no acute distress. HEENT exam atraumatic, normocephalic, neck supple without jugular venous distention. Chest clear to auscultation cardiac exam S1-S2 are regular. Abdominal exam overweight with bowel sounds, soft and nontender. Extremities no edema. Neurologic exam is alert with a normal gait. Dermatologic exam: no rash, no excoriations. Skin on hands is quite dry     Assessment & Plan:

## 2010-03-22 NOTE — Patient Instructions (Signed)
After showering apply mineral oil to skin than towel dry

## 2010-03-22 NOTE — Assessment & Plan Note (Signed)
Unclear etiology I doubt liver related but he does drink alcohol to excess---check labs He does have significantly dry skin, he does describe at least one episode of what sounds like hives  Trial prednisone triam cream Mineral oil after shower

## 2010-03-23 NOTE — Assessment & Plan Note (Addendum)
Summary: ALLERGY/CB   Nurse Visit   Allergies: No Known Drug Allergies

## 2010-03-23 NOTE — Assessment & Plan Note (Signed)
Summary: rov rsc from 2/8 nos   Copy to:  n/a Primary Provider/Referring Provider:  Swords  CC:  follow up visit-sleep apnea and asthma..  History of Present Illness: September 03, 2009- Chronic bronchitis/ bronchiectasis, hx sinsuitis He has noted some persistent congestion, blaming the summer air quality. He has been able to walk little outside. We discussed potential for ACEinhibitor to be part of his chronic cough and I suggested a trial of an ARB instead. He thinks Dr Alanda Amass was the one who wrote for it and will see him in a month.  He has limped along with rare use of nasonex and proair samples. Dr Jenne Pane got him off the otc nasal decongestant sprays.  October 01, 2009- Chronic bronchitis/bronchiectasis, hx sinusitis Got strangled on water 9/7- see phone note. he says his throat is back now to his baseline  throat clearing chronic cough .He denies any reflux. At the 8/11 OV we gave samples of Benicar 20, 1/2 daily, to try instead of Altace. So far after 3 weeks he has not seen any impovement in his throat clearing, but will use up the supply he has. He attributes redness I see in throat today to the vigorous  throat clearing he was doing after strangling on the drink, and says it is not as sore now.   March 15, 2010-  Chronic bronchitis/bronchiectasis, hx sinusitis Nurse-CC: follow up visit-sleep apnea and asthma. Allergy vaccine 1:10 GH Doing well. They do help. He rarely forgets and misses a week or two, but has not had problems. No significant nasal congestion, drainage or wheeze now.  CPAP Advanced- He remains fully compliant , using it all night every night. Denies daytime sleepiness. Will have an occasional night when he sleeps poorly. Takes 1/2 generic Ambien almost every night.       Preventive Screening-Counseling & Management  Alcohol-Tobacco     Alcohol drinks/day: 4+     Smoking Status: quit > 6 months     Year Started: 1960     Year Quit: 1966     Pack years:  6 years  1/2 pack  Current Medications (verified): 1)  Tambocor 50 Mg Tabs (Flecainide Acetate) .... Take 1 By Mouth  Two Times A Day 2)  Altace 2.5 Mg Tabs (Ramipril) .... Once Daily 3)  Norvasc 5 Mg  Tabs (Amlodipine Besylate) .... One-Half By Mouth Daily 4)  Pantoprazole Sodium 40 Mg Tbec (Pantoprazole Sodium) .... Take 1 Tablet By Mouth Once A Day 5)  Plavix 75 Mg  Tabs (Clopidogrel Bisulfate) .... One By Mouth Daily 6)  Simvastatin 40 Mg Tabs (Simvastatin) .... Take 1 Tablet By Mouth At Bedtime 7)  Toprol Xl 25 Mg  Tb24 (Metoprolol Succinate) .... One-Half By Mouth Daily 8)  Aspirin Ec 81 Mg  Tbec (Aspirin) .... Take 1 Tablet By Mouth Once A Day 9)  Advair Diskus 500-50 Mcg/dose  Misc (Fluticasone-Salmeterol) .... Inhale 1 Puff Once Daily 10)  Allergy Vaccine Gh 1:10 .... Once Weekly 11)  Accolate 20 Mg Tabs (Zafirlukast) .... Take 2 Tablet By Mouth Every Morning 12)  Citrucel  Powd (Methylcellulose (Laxative)) .... As Directed 13)  Xopenex 1.25 Mg/34ml Nebu (Levalbuterol Hcl) .Marland Kitchen.. 1 Neb Three Times A Day As Needed Asthma 14)  Nebulizer Machine .... As Directed For Asthma 15)  Cpap  "6.8" Advanced 16)  Zolpidem Tartrate 5 Mg  Tabs (Zolpidem Tartrate) .... 1/2-1 By Mouth At Night As Needed 17)  Nasonex 50 Mcg/act Susp (Mometasone Furoate) .Marland Kitchen.. 1-2 Puffs Each  Nostril Once Daily 18)  Proair Hfa 108 (90 Base) Mcg/act Aers (Albuterol Sulfate) .... 2 Puffs Four Times A Day As Needed Rescue Inhaler  Allergies (verified): No Known Drug Allergies  Past History:  Past Medical History: Last updated: 10/01/2009 DIVERTICULOSIS, COLON (ICD-562.10) Hx of COLITIS, ACUTE (ICD-558.9) COLONIC POLYPS, ADENOMATOUS, HX OF (ICD-V12.72) GERD (ICD-530.81) HYPERLIPIDEMIA (ICD-272.4) ESOPHAGEAL STRICTURE (ICD-530.3) UNSPECIFIED HEMORRHAGE OF GASTROINTESTINAL TRACT (ICD-578.9) BACK PAIN (ICD-724.5) UPPER RESPIRATORY INFECTION (ICD-465.9) ALCOHOL ABUSE (ICD-305.00) MRI, BRAIN, ABNORMAL  (ICD-794.09) HEADACHE (ICD-784.0) UNSPECIFIED DENTOFACIAL ANOMALIES (ICD-524.9) HAND PAIN (ICD-729.5) ALLERGIC CONJUNCTIVITIS (ICD-372.14) ALLERGIC RHINITIS (ICD-477.9) FAMILY HISTORY OF ALCOHOLISM/ADDICTION (ICD-V61.41) PHYSICAL EXAMINATION (ICD-V70.0) TRANSIENT ISCHEMIC ATTACKS, HX OF (ICD-V12.50) HYPERGLYCEMIA (ICD-790.29) MYOCARDIAL INFARCTION, HX OF (ICD-412) HYPERTENSION (ICD-401.9) CORONARY ARTERY DISEASE (ICD-414.00) PROSTATE CANCER, HX OF (ICD-V10.46) ATRIAL FIBRILLATION (ICD-427.31) ASTHMA (ICD-493.90) HIP PAIN, LEFT (ICD-719.45) SHOULDER PAIN, LEFT (ICD-719.41) Obstructive Sleep Apnea        - s/p UPPP surgery  Past Surgical History: Last updated: 05/12/2009 UPPP Tonsillectomy,adenoidectomy knee surgery X 3--arthroscopy-bilateral (2 surgeries-right, 1 surgery-left) Prostatectomy  02/02, prostate ca  Family History: Last updated: 02/14/2008 Family History of Alcoholism/Addiction Family History of Heart Disease: Brother, Father  Social History: Last updated: 05/12/2009 Occupation: Museum/gallery conservator paper and Engineer, petroleum Married Alcohol use-yes-currently on hold Regular exercise-yes---walking regularly (2010) Patient is a former smoker. -stopped age 74 Illicit Drug Use - no  Risk Factors: Alcohol Use: 4+ (03/15/2010) Exercise: yes (02/18/2008)  Risk Factors: Smoking Status: quit > 6 months (03/15/2010)  Review of Systems      See HPI  The patient denies shortness of breath with activity, shortness of breath at rest, productive cough, non-productive cough, coughing up blood, chest pain, irregular heartbeats, acid heartburn, indigestion, loss of appetite, weight change, abdominal pain, difficulty swallowing, sore throat, tooth/dental problems, headaches, sneezing, nasal congestion/difficulty breathing through nose, itching, and hand/feet swelling.    Vital Signs:  Patient profile:   75 year old male Height:      73 inches Weight:      191.25 pounds BMI:      25.32 O2 Sat:      96 % on Room air Pulse rate:   64 / minute BP sitting:   130 / 74  (left arm) Cuff size:   regular  Vitals Entered By: Reynaldo Minium CMA (March 15, 2010 2:32 PM)  O2 Flow:  Room air CC: follow up visit-sleep apnea and asthma.   Physical Exam  Additional Exam:  General: A/Ox3; pleasant and cooperative, NAD,  NODES: no lymphadenopathy HEENT: [place]/AT, EOM- WNL, Conjuctivae- clear, PERRLA, less  periorbital edema, TM-WNL, Nose-  mucosa is pale, not edematous.   Throat-  moderate erythema, S/P UPPP, voice sounds just a little hoarse. NECK: Supple w/ fair ROM, JVD- none, normal carotid impulses w/o bruits Thyroid- CHEST: Clear to P&A HEART: RRR, no m/g/r heard- regular pulse today ABDOMEN: Soft and nl;  ZOX:WRUE, nl pulses, no edema  NEURO: Grossly intact to observation, tremulous      Impression & Recommendations:  Problem # 1:  OBSTRUCTIVE SLEEP APNEA (ICD-327.23) Good compliance and controll with CPAP. Incidental insomnia he relates to job and family stress, managed with very low dose ambien.   Problem # 2:  ALLERGIC RHINITIS (ICD-477.9)  Good control with his allergy vaccine. No change in morning awareness of postnasal drainage. He tried an alternative to Advair powder, w/o effect. Could try saline spray. Discussed ACE inhibitor Altace for throat clearing.  His updated medication list for this problem includes:    Nasonex 50 Mcg/act  Susp (Mometasone furoate) .Marland Kitchen... 1-2 puffs each nostril once daily  Other Orders: Est. Patient Level III (16109)  Patient Instructions: 1)  Please schedule a follow-up appointment in 6 months. 2)  You could ask Dr Cato Mulligan if he would be interested in letting you try a different kind of BP pill for a month or so, to see if the ACE inhibitor Altace is causing the morning throat discomfort.  3)  Continue CPAP 4)  Continue allergy vaccine, Advair and Nasonex.

## 2010-03-30 ENCOUNTER — Emergency Department (HOSPITAL_COMMUNITY): Payer: Medicare Other

## 2010-03-30 ENCOUNTER — Observation Stay (HOSPITAL_COMMUNITY): Payer: Medicare Other

## 2010-03-30 ENCOUNTER — Observation Stay (HOSPITAL_COMMUNITY)
Admission: EM | Admit: 2010-03-30 | Discharge: 2010-03-31 | Disposition: A | Discharge status: Home / Self Care | Payer: Medicare Other | Attending: Internal Medicine | Admitting: Internal Medicine

## 2010-03-30 DIAGNOSIS — M549 Dorsalgia, unspecified: Secondary | ICD-10-CM | POA: Insufficient documentation

## 2010-03-30 DIAGNOSIS — M25549 Pain in joints of unspecified hand: Secondary | ICD-10-CM | POA: Insufficient documentation

## 2010-03-30 DIAGNOSIS — Z8546 Personal history of malignant neoplasm of prostate: Secondary | ICD-10-CM | POA: Insufficient documentation

## 2010-03-30 DIAGNOSIS — M25439 Effusion, unspecified wrist: Principal | ICD-10-CM | POA: Insufficient documentation

## 2010-03-30 LAB — CBC
HCT: 39.2 % (ref 39.0–52.0)
MCHC: 34.9 g/dL (ref 30.0–36.0)
RDW: 13.2 % (ref 11.5–15.5)

## 2010-03-30 LAB — POCT I-STAT, CHEM 8
HCT: 41 % (ref 39.0–52.0)
Hemoglobin: 13.9 g/dL (ref 13.0–17.0)
Potassium: 3.8 mEq/L (ref 3.5–5.1)
Sodium: 135 mEq/L (ref 135–145)

## 2010-03-30 LAB — CK TOTAL AND CKMB (NOT AT ARMC)
CK, MB: 0.9 ng/mL (ref 0.3–4.0)
Relative Index: INVALID (ref 0.0–2.5)
Total CK: 38 U/L (ref 7–232)

## 2010-03-30 LAB — LIPASE, BLOOD: Lipase: 46 U/L (ref 11–59)

## 2010-03-30 LAB — C-REACTIVE PROTEIN: CRP: 2.4 mg/dL — ABNORMAL HIGH (ref <0.6)

## 2010-03-30 LAB — CARDIAC PANEL(CRET KIN+CKTOT+MB+TROPI)
CK, MB: 0.8 ng/mL (ref 0.3–4.0)
Total CK: 32 U/L (ref 7–232)
Troponin I: 0.03 ng/mL (ref 0.00–0.06)

## 2010-03-30 LAB — HEPATIC FUNCTION PANEL
Albumin: 3.2 g/dL — ABNORMAL LOW (ref 3.5–5.2)
Bilirubin, Direct: 0.2 mg/dL (ref 0.0–0.3)
Indirect Bilirubin: 0.4 mg/dL (ref 0.3–0.9)
Total Bilirubin: 0.6 mg/dL (ref 0.3–1.2)

## 2010-03-30 LAB — DIFFERENTIAL
Basophils Absolute: 0 10*3/uL (ref 0.0–0.1)
Eosinophils Relative: 2 % (ref 0–5)
Lymphocytes Relative: 16 % (ref 12–46)
Monocytes Absolute: 0.8 10*3/uL (ref 0.1–1.0)

## 2010-03-30 LAB — POCT CARDIAC MARKERS: Myoglobin, poc: 49.5 ng/mL (ref 12–200)

## 2010-03-30 LAB — PROTIME-INR: INR: 0.92 (ref 0.00–1.49)

## 2010-03-30 LAB — APTT: aPTT: 26 seconds (ref 24–37)

## 2010-03-30 LAB — SEDIMENTATION RATE: Sed Rate: 10 mm/hr (ref 0–16)

## 2010-03-30 MED ORDER — IOHEXOL 300 MG/ML  SOLN
100.0000 mL | Freq: Once | INTRAMUSCULAR | Status: AC | PRN
  Administered 2010-03-30: 100 mL via INTRAVENOUS

## 2010-04-01 ENCOUNTER — Telehealth: Payer: Self-pay | Admitting: Internal Medicine

## 2010-04-01 DIAGNOSIS — R700 Elevated erythrocyte sedimentation rate: Secondary | ICD-10-CM

## 2010-04-01 DIAGNOSIS — M2550 Pain in unspecified joint: Secondary | ICD-10-CM

## 2010-04-01 NOTE — Progress Notes (Signed)
Summary: Advanced- CPAP is on 8  Phone Note Other Incoming   Summary of Call: Advanced- CPAP is 9 since 2008 Initial call taken by: Waymon Budge MD,  March 21, 2010 7:37 PM    New/Updated Medications: * CPAP  8  ADVANCED

## 2010-04-01 NOTE — Telephone Encounter (Signed)
Pt called to adv that he needs to be seen by Dr Cato Mulligan asap.... Adv he went to the ED for eval and was admitted to the 3rd flr for sxs of back/neck pain that spread to his hands and cause drawing of his hands.... He adv that no official dx was given... Requests that Dr Cato Mulligan work him into schedule for f/u this coming week...Marland KitchenMarland Kitchen pts contact # [phone].

## 2010-04-02 ENCOUNTER — Encounter: Payer: Self-pay | Admitting: Internal Medicine

## 2010-04-02 ENCOUNTER — Ambulatory Visit (INDEPENDENT_AMBULATORY_CARE_PROVIDER_SITE_OTHER): Payer: Medicare Other | Admitting: Internal Medicine

## 2010-04-02 VITALS — BP 142/82 | Temp 98.8°F | Wt 189.0 lb

## 2010-04-02 DIAGNOSIS — M2550 Pain in unspecified joint: Secondary | ICD-10-CM

## 2010-04-02 LAB — CBC WITH DIFFERENTIAL/PLATELET
Basophils Absolute: 0 10*3/uL (ref 0.0–0.1)
Eosinophils Absolute: 0.1 10*3/uL (ref 0.0–0.7)
MCHC: 33.9 g/dL (ref 30.0–36.0)
MCV: 91.5 fl (ref 78.0–100.0)
Monocytes Absolute: 0.7 10*3/uL (ref 0.1–1.0)
Neutrophils Relative %: 74.7 % (ref 43.0–77.0)
Platelets: 288 10*3/uL (ref 150.0–400.0)

## 2010-04-02 LAB — HEPATIC FUNCTION PANEL
Bilirubin, Direct: 0.2 mg/dL (ref 0.0–0.3)
Total Bilirubin: 1 mg/dL (ref 0.3–1.2)

## 2010-04-02 LAB — BASIC METABOLIC PANEL
BUN: 25 mg/dL — ABNORMAL HIGH (ref 6–23)
CO2: 32 mEq/L (ref 19–32)
Chloride: 98 mEq/L (ref 96–112)
Creatinine, Ser: 1.1 mg/dL (ref 0.4–1.5)

## 2010-04-02 LAB — URIC ACID: Uric Acid, Serum: 5.7 mg/dL (ref 4.0–7.8)

## 2010-04-02 MED ORDER — PREDNISONE 20 MG PO TABS: ORAL_TABLET | ORAL | Status: DC

## 2010-04-02 NOTE — Progress Notes (Signed)
  Subjective:    Patient ID: Corey [name], male    DOB: 10/22/1935, 75 y.o.   MRN: [id number]  HPI pt here with wife Gradual onset left sided low back pain,  This was followed shortly by severe left-sided low back pain meds and started to radiate to left shoulder and down left arm. He went to the hospital and ruled out for myocardial infarction. In addition to the above complaints his symptoms quickly spread to his hands bilaterally. He had bilateral hand pain that was severe. This was then followed by bilateral groin and hip pain. Also classified as severe. Pain increases with walking. That pain increases with walking. Went to the hospital. MRI and laboratory work reviewed. Patient was seen by orthopedics unable to get that result at this time. Patient did state that his right hand was swollen over the MCP joints. This is somewhat better. Patient denies any fever or sweats. He does occasionally feel chilled but this is when he has extreme pain. Patient did travel to [place] this past weekend he felt well at that time. No unusual foods. I reviewed my previous note that time he had some diffuse pruritus. That resolved quickly with prednisone and hydroxyzine. That is now coming back.  Past Medical History  Diagnosis Date  . Alcohol abuse, episodic 05/19/2008  . ALLERGIC RHINITIS 04/15/2009  . ASTHMA 11/16/2006  . Atrial fibrillation 11/16/2006  . COLONIC POLYPS, ADENOMATOUS, HX OF 02/14/2008  . CORONARY ARTERY DISEASE 11/16/2006  . DIVERTICULOSIS, COLON 02/14/2008  . ESOPHAGEAL STRICTURE 01/28/2008  . GERD 02/14/2008  . HYPERGLYCEMIA 11/16/2006  . HYPERLIPIDEMIA 02/14/2008  . HYPERTENSION 11/16/2006  . INSOMNIA-SLEEP DISORDER-UNSPEC 02/11/2009  . MYOCARDIAL INFARCTION, HX OF 11/16/2006  . OBSTRUCTIVE SLEEP APNEA 11/10/2008  . 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 PROSTATE CANCER, HX OF 02/25/2000  . SLEEP APNEA 10/03/2009  . TRANSIENT ISCHEMIC ATTACKS, HX OF 11/16/2006   Past Surgical History    Procedure Date  . Uvulopalatopharyngoplasty   . Tonsilectomy, adenoidectomy, bilateral myringotomy and tubes   . Knee arthroscopy     2 surgeries right and 1 left  . Prostate surgery 2/02    prostate cancer    reports that he quit smoking about 46 years ago. His smoking use included Cigarettes. He does not have any smokeless tobacco history on file. He reports that he drinks alcohol. He reports that he does not use illicit drugs. family history includes Heart attack in his brother and father. No Known Allergies No Known Allergies   Review of Systems  patient denies chest pain, shortness of breath, orthopnea. Denies lower extremity edema, abdominal pain, change in appetite, change in bowel movements. Patient denies rashes, musculoskeletal complaints. No other specific complaints in a complete review of systems.      Objective:   Physical Exam  well-developed well-nourished male in no acute distress.  Appears uncomfortable. HEENT exam atraumatic, normocephalic, neck supple without jugular venous distention. Chest clear to auscultation cardiac exam S1-S2 are regular. Abdominal exam overweight with bowel sounds, soft and nontender. Extremities no edema. Neurologic exam he is alert.  No tenosynovitis.  however, his wrists to feel somewhat warm. No significant rash. There are a few areas of excoriation on both wrists. He is walking with a broad-based gait.       Assessment & Plan:

## 2010-04-02 NOTE — Assessment & Plan Note (Addendum)
Unknown cause Prednisone ---emperic May need rheumatology appt Side effects discussed Has--oxycodone   a long discussion with patient and his wife. Etiology of his symptoms are not known. I think his symptoms are more likely to be an connective tissue disease differential diagnosis. I will check some lab work now. He was told in the hospital that he had diffuse gout. I don't think this is the cause of his symptoms. I will check a uric acid level. I will set prednisone. Side effects discussed. Patient and his wife know that this is empiric treatment. We will need to search for a diagnosis for long-term treatment plan.

## 2010-04-02 NOTE — Discharge Summary (Signed)
NAME:  Corey [name], Corey [name] NO.:  [id number]  MEDICAL RECORD NO.:  [id number]           PATIENT TYPE:  I  LOCATION:  3729                         FACILITY:  [facility]  PHYSICIAN:  Ladell Pier, M.D.   DATE OF BIRTH:  1935/06/05  DATE OF ADMISSION:  03/30/2010 DATE OF DISCHARGE:  03/31/2010                              DISCHARGE SUMMARY   DISCHARGE DIAGNOSES: 1. Bilateral hand pain, neck pain, and back pain. 2. Coronary artery disease. 3. History of prostate cancer status post prostatectomy 10 years ago. 4. Asthma. 5. Gastroesophageal reflux disease. 6. Diverticulosis.  DISCHARGE MEDICATIONS: 1. Hydrocodone 5/325 every 4 hours as needed. 2. Advair Diskus 500/50 one puff every morning. 3. Ambien 5 mg at bedtime as needed. 4. Aspirin 81 mg daily. 5. Hydrochlorothiazide 25 mg 1/2 tablet daily. 6. Protonix 40 mg every morning. 7. Plavix 75 mg every evening. 8. Ramipril 2.5 mg every morning. 9. Simvastatin 40 mg every morning. 10.Flecainide 50 mg twice daily. 11.Toprol-XL 25 mg 1/2 tablet daily. 12.Zafirlukast 20 mg 2 tablets in the morning.  FOLLOWUP APPOINTMENTS:  The patient is to follow up with Dr. Riley Kill in 1 week.  PROCEDURES: 1. MRI of the lumbar sacral spine:  Sigmoid diverticulosis without     evidence of diverticulitis, lower lumbar degenerative disk disease     as discussed on the lumbar MRI report.  No specific abnormality of     the sacroiliac joint is identified. 2. MRI of the T-spine:  Diffuse thoracic facet arthropathy results in     multilevel thoracic neural foraminal stenosis, maximal at T1, T5,     T9, T10, and T11 nerve levels.  Multilevel mid and lower thoracic     disk protrusion, extrusions maximal at the T6-T7 level where there     is mild spinal stenosis, mild increased T2 signal in the central     thoracic spinal cord from T5-T6 to T12.  Differential     considerations include physiologic prominence of the central spinal     canal  and ventriculus terminalis or less likely mild     syringohydromyelia, right greater than left lung base abnormal     signal could reflect atelectasis or infection.  No advanced finding     in the lumbar spine. 3. X-ray of the wrist:  Some progression of first CMC osteoarthritis. 4. CT angio of the chest:  No PE, no dissection. 5. CT of the abdomen:  No evidence of aortic dissection or granuloma     disease in the spleen or nonobstructing stone in the midpole of the     kidney bilaterally. 6. Chest x-ray:  No active disease.  CONSULTATIONS:  Almedia Balls. Ranell Patrick, MD, Orthopedics.  HISTORY OF PRESENT ILLNESS:  The patient is a 75 year old male with history of coronary artery disease, diverticulosis, prostate cancer status post prostatectomy presented to [facility] Emergency Room with acute complaint of back pain.  History was provided by the patient who states that he has a history of off and on chronic back pain.  Past medical history, family history, social history, meds, allergies, review of systems per admission H and  P.  DISCHARGE PHYSICAL EXAMINATION:  VITAL SIGNS:  Temperature 99.2, pulse of 64, respirations 14, blood pressure 153/71, pulse ox 96% on room air. GENERAL:  The patient is sitting up in bed, well-nourished white male. HEENT:  Head:  Normocephalic and atraumatic.  Pupils reactive to light without erythema. CARDIOVASCULAR:  Regular rhythm. LUNGS:  Clear bilaterally. ABDOMEN:  Positive bowel sounds. EXTREMITIES:  Without edema.  HOSPITAL COURSE:  Back pain, bilateral wrist pain.  The patient was admitted to the hospital with him having the back pain that radiated up to his neck.  He had MRIs done that were negative for any cord compression.  He had CT of the chest done to rule out dissection and TEE which were both ruled out.  He continued to complain of the neck pain, back pain, and the wrist pain.  Orthopedics was consulted and think mostly his back pain is chronic  since he has already had steroid injections for that and thinks that bilateral wrist pain mostly at this time is most likely gout.  He recommended colchicine or allopurinol since the patient is on Plavix and cannot take Indocin.  The patient is not really convinced of this.  I will give him a shot of steroids IV and he could follow up with Dr. Timoteo Gaul to discuss whether he wants to do colchicine now if he sees no improvement with the steroids or if he is better.  If he wants to go on allopurinol for prevention.  All these tests as discussed above were all within normal limits.  His chronic medical problems remained stable throughout his hospitalization.  DISCHARGE LABORATORY DATA:  CK 31, MB 0.8, and troponin 0.03.  Total cholesterol 138, triglyceride 67, HDL 57, LDL 68, and ESR of 10.  AST 21 and ALT 14.  TIME SPENT:  With the patient and doing this discharge is approximately 45 minutes.     Ladell Pier, M.D.     [place]/MEDQ  D:  03/31/2010  T:  03/31/2010  Job:  [id number]  Electronically Signed by Ladell Pier M.D. on 04/01/2010 07:57:54 AM

## 2010-04-05 LAB — ANA: Anti Nuclear Antibody(ANA): NEGATIVE

## 2010-04-05 MED ORDER — IOHEXOL 300 MG/ML  SOLN
100.0000 mL | Freq: Once | INTRAMUSCULAR | Status: AC | PRN
  Administered 2010-04-05: 100 mL via INTRAVENOUS

## 2010-04-06 NOTE — Progress Notes (Signed)
The patient left the office before the visit was finished. --------------left message on machine for pt to call back 3-13

## 2010-04-07 ENCOUNTER — Telehealth: Payer: Self-pay | Admitting: *Deleted

## 2010-04-07 NOTE — Telephone Encounter (Signed)
Pt given Dr. Cato Mulligan recommendations on Prednisone. He is already on Prednisone and does not know if Dr. Cato Mulligan knows this.

## 2010-04-07 NOTE — Telephone Encounter (Signed)
Pt aware of lab results and told pt to keep taking prednisone.  Also pt is calling about his jaw locking up.  I recommended an appt with another physician but pt declined.  He will call back on Friday and let me know how he is.

## 2010-04-07 NOTE — H&P (Signed)
NAME:  JAKORIAN, MARENGO NO.:  [id number]  MEDICAL RECORD NO.:  [id number]           PATIENT TYPE:  E  LOCATION:  [facility]                         FACILITY:  [facility]  PHYSICIAN:  Thad Ranger, MD       DATE OF BIRTH:  1935-06-04  DATE OF ADMISSION:  03/30/2010 DATE OF DISCHARGE:                             HISTORY & PHYSICAL   PRIMARY CARE PHYSICIAN:  Valetta Mole. Swords, MD  GASTROENTEROLOGIST:  Hedwig Morton. Juanda Chance, MD  CARDIOLOGIST:  Pearletha Furl. Alanda Amass, MD  CHIEF COMPLAINT:  Back pain.  HISTORY OF PRESENT ILLNESS:  Mr. Brandow is a 75 year old male with history of coronary artery disease, diverticulosis, history of prostate cancer status post prostatectomy who presented to the [facility] Emergency Room with acute complaint of back pain.  History was provided by the patient who stated that he has a history of off and on chronic back pain.  The patient reports that while he was driving to [place] at around 1:30 to 2 p.m., he started having left-sided lower back pain. Initially he stated it was 3/10 in intensity, sharp and worse on deep breathing and intermittent.  Pain steadily worsened to excruciating pain 7/10 and radiated to left shoulder and neck area and he started having headache as well.  He denied having any chest pain or the pain radiating to any arm or any numbness or tingling.  The patient states that he also has pain in his hands when he tries to make a fist.  The patient has a history of atrial fibrillation as well.  Other than that, he denies any rashes or any back trauma.  He otherwise denied any chest pain or shortness of breath, fever, chills, nausea, vomiting, any abdominal pain, any diarrhea or constipation, any hematochezia or melena.  He denies any focal weakness, numbness, or tingling or any urinary or bowel incontinence.  REVIEW OF SYSTEMS:  Dictated as above, otherwise negative.  PAST MEDICAL HISTORY: 1. Diverticulosis. 2. History of  coronary artery disease. 3. History of prostate cancer status post prostatectomy 10 years ago.  SURGICAL HISTORY:  History of knee surgery in the past.  SOCIAL HISTORY:  The patient occasionally drinks alcohol.  Denies any smoking or any drug use.  Is very functionally active and lives at home with his family.  MEDICATIONS PRIOR TO ADMISSION: 1. Altace 2.5 mg p.o. daily. 2. Aspirin 81 mg p.o. daily. 3. Flecainide 50 mg p.o. b.i.d. 4. Hydrochlorothiazide 25 mg p.o. daily. 5. Norvasc 5 mg p.o. daily. 6. Plavix 75 mg daily. 7. Simvastatin 40 mg p.o. daily. 8. Toprol-XL 25 mg daily. 9. Advair Diskus 500/50 once a day. 10.Pantoprazole 40 mg p.o. daily. 11.Zafirlukast 20 mg daily.  PHYSICAL EXAMINATION:  VITAL SIGNS:  Blood pressure 155/74, pulse rate 74, respiratory rate 20, temperature 99.2. GENERAL:  The patient is alert, awake, and oriented x3 not in acute distress. HEENT:  Anicteric sclerae.  Pink conjunctivae.  Pupils reactive to light and accommodation.  EOMI. NECK:  Supple.  No lymphadenopathy.  No JVD. CVS:  S1 and S2 clear. CHEST:  Clear to auscultation bilaterally.  No rales or rhonchi  or wheezing. ABDOMEN:  Soft, nontender, and nondistended.  Normal bowel sounds. EXTREMITIES:  No cyanosis, clubbing or edema noted in the upper or lower extremities. NEURO:  Cranial nerves II through XII intact.  Motor strength intact. BACK:  No point tenderness noted in the back or any rashes noted in the back.  RADIOLOGICAL DATA:  Chest x-ray, two-view March 30, 2010, showed no active disease.  CT angiogram of the chest and abdomen showed no evidence of any aortic aneurysm or dissection.  No filling defects to suggest any pulmonary emboli.  Old granuloma disease within the chest dependent atelectasis.  CT angiogram of the abdomen showed no evidence of aortic aneurysm or dissection.  Small nonobstructing stones in the mid poles of the kidneys bilaterally, scattered colonic diverticula  most pronounced at the sigmoid colon, no diverticulitis.  LABORATORY DATA:  CBC showed white count of 9.0, hemoglobin 13.7, hematocrit 39.2, platelets 240.  Cardiac markers negative.  INR 0.92.  IMPRESSION AND PLAN:  Mr. Meinzer is a 75 year old male with a history of coronary artery disease, prostate cancer presenting with acute back pain. 1. Acute back pain, unclear in etiology so far.  The biggest concern     was any pulmonary embolism or acute aortic dissection, which has     been already ruled out by a CT angiogram of the chest and abdomen     and pelvis.  The patient has no neurological deficits or any     urinary or bowel incontinence or any spinal tenderness.  The     patient did not have any rashes so far to suggest acute shingles     causing the pain and his pain does not appear to be in a dermatomal     fashion.  For now, I will obtain a stat ESR as well as MRI of his     thoracolumbar and sacral spine to rule out any nerve root     compression.  The patient will be monitored closely if his pain     becomes in a dermatomal fashion or the rash comes on to suggest any     shingles.  First set of the cardiac enzyme has been negative.  The     pain does not seem to be cardiac in nature. 2. History of coronary artery disease.  The patient has not had any     PCI or any CABG.  He will be monitored closely and the cardiac     enzymes has already been ordered by the emergency room.  They will     continue to follow. 3. History of prostate cancer.  The patient stated that he had a     prostatectomy done 10 years ago.  We will follow MRI of the     thoracolumbar and sacral spine to rule out if he has any mets or if     there is any reoccurrence, which is less likely. 4. History of asthma, stable.  Continue Zafirlukast and Advair. 5. Prophylaxis.  Lovenox for DVT prophylaxis. 6. Gastroesophageal reflux disease.  The patient is on PPI.  We will     continue. 7. Full code  status.     Thad Ranger, MD     RR/MEDQ  D:  03/30/2010  T:  03/30/2010  Job:  [id number]  cc:   Valetta Mole. Swords, MD  Electronically Signed by Andres Labrum RAI  on 04/07/2010 07:52:55 AM

## 2010-04-09 NOTE — Telephone Encounter (Signed)
Per Dr Cato Mulligan ref to rheumatology

## 2010-04-10 NOTE — Discharge Summary (Signed)
NAME:  Corey [name], SCHOENBERGER NO.:  [id number]  MEDICAL RECORD NO.:  [id number]           PATIENT TYPE:  I  LOCATION:  3729                         FACILITY:  [facility]  PHYSICIAN:  Almedia Balls. Ranell Patrick, M.D. DATE OF BIRTH:  1935-04-07  DATE OF ADMISSION:  03/30/2010 DATE OF DISCHARGE:                              DISCHARGE SUMMARY   REASON FOR CONSULTATION:  Bilateral hand and wrist pain.  HISTORY OF PRESENT ILLNESS:  The patient is a 75 year old male with a history of multiple medical issues including coronary artery disease and prostate cancer, who presents with a several-day history of increasing and wrist pain.  The patient complained of more right-sided pain than left-sided pain.  The patient states that he had no trauma, no change in activity other than the fact that he held some laundry with hangers with his fingers several days ago.  The patient was recently on vacation and states that he ate well.  He denies any history of prior gout.  The patient's pain was severe to 7/10 potentially and he also had neck and left shoulder pain as well.  Note:  The patient has a history of lumbar spinal stenosis, has been seen by Dr. Norlene Campbell with [facility].  The patient has been hospitalized for further evaluation of his neck and back pain.  He has recently today undergone MRI imaging of the spine, which has not been read out yet.  I was asked evaluate this patient for severe pain in the hand and wrist. The patient states his left hand and wrist is actually feeling a lot better and his right side may be easing off because of thenar pain he had is actually left and first webspace pain.  The patient denies any associated numbness or tingling in his hands currently.  PAST MEDICAL HISTORY: 1. History of coronary artery disease. 2. History of prostate cancer. 3. History of diverticulosis.  PAST SURGICAL HISTORY: 1. Prior prostatectomy 10 years  ago. 2. History of knee surgery.  MEDICATIONS:  Please see MAR including Plavix.  ALLERGIES:  No known drug allergies.  SOCIAL HISTORY:  The patient is married.  He is here with his wife.  The patient denies smoking, but does drink occasional alcohol.  PHYSICAL EXAMINATION:  GENERAL:  A healthy-appearing adult male.  He is in no acute distress, alert and oriented. EXTREMITIES:  Examination of the right upper extremity reveals a swollen wrist with an obvious effusion.  He is also swollen over the index metacarpophalangeal joint.  Slight swelling of the index and long finger.  He holds his finger with his right hand in slightly flexed position, but is not able to fully flex his fingers and his palm, nor can he fully extend without pain.  He offers his left hand to shake.  He is able to have a nice, strong handshake in the left hand.  No wrist swelling noted.  No metacarpophalangeal swelling.  Full extension of his fingers with a grade 5 motor strength in all groups tested.  Sensation is intact to light touch, both hands.  Attempts at passive range of motion of  the wrist, painful on the right, not on the left.  Same with index and long finger metacarpals on the right, but not on the left. Skin is intact.  No rashes were noted.  RADIOGRAPHS:  AP and lateral, both wrists and both hands showing some mild arthritis especially at base of thumb arthritis, but no other findings.  IMPRESSION:  Bilateral hand and wrist pain, currently right greater than left with clinical improvement recently, likely secondary to gout.  PLAN:  Discussed with Mr. Faucett and his wife that more than likely, he is suffering from gouty attack in his extremities.  I doubt this is specifically related to his spine, neck, and left shoulder pain.  The patient does have an orthopedic surgery, who has been managing him for sciatica and spinal stenosis and he has even had epidural steroid injections for this condition  most recently about 3 weeks ago.  I would recommend that the patient have followup.  Should further workup or evaluation of the spine be warranted or Orthopedic consultation be warranted, we recommend that Dr. Norlene Campbell from [facility] be contacted as he is an established patient with Dr. Cleophas Dunker and that condition is currently being managed by Dr. Cleophas Dunker.  As far as the hands go, I would recommended rest in which we are recommending a short arm splint, which will rest the wrist and the hand, recommending elevation and ice, and medical management for gout. The patient is on Plavix, he will not be able to have anti-inflammatory treatments such as Indocin, but may benefit from colchicine, and/or allopurinol.  This is deemed safe from Internal Medicine standpoint.  We would be happy to provide any other assistance we can.  I appreciate the consult on Autumn Patty.     Almedia Balls. Ranell Patrick, M.D.     SRN/MEDQ  D:  03/30/2010  T:  03/31/2010  Job:  [id number]  Electronically Signed by Malon Kindle  on 04/10/2010 11:21:46 AM

## 2010-04-13 LAB — CBC
HCT: 35.2 % — ABNORMAL LOW (ref 39.0–52.0)
HCT: 36.9 % — ABNORMAL LOW (ref 39.0–52.0)
HCT: 38.3 % — ABNORMAL LOW (ref 39.0–52.0)
Hemoglobin: 12.2 g/dL — ABNORMAL LOW (ref 13.0–17.0)
Hemoglobin: 12.8 g/dL — ABNORMAL LOW (ref 13.0–17.0)
Hemoglobin: 13.2 g/dL (ref 13.0–17.0)
MCV: 86.7 fL (ref 78.0–100.0)
MCV: 86.9 fL (ref 78.0–100.0)
Platelets: 154 10*3/uL (ref 150–400)
Platelets: 194 10*3/uL (ref 150–400)
RBC: 4.06 MIL/uL — ABNORMAL LOW (ref 4.22–5.81)
RBC: 4.42 MIL/uL (ref 4.22–5.81)
RBC: 4.68 MIL/uL (ref 4.22–5.81)
RDW: 14.1 % (ref 11.5–15.5)
RDW: 14.1 % (ref 11.5–15.5)
RDW: 14.2 % (ref 11.5–15.5)
WBC: 4.6 10*3/uL (ref 4.0–10.5)
WBC: 5.4 10*3/uL (ref 4.0–10.5)

## 2010-04-13 LAB — BASIC METABOLIC PANEL
BUN: 16 mg/dL (ref 6–23)
BUN: 17 mg/dL (ref 6–23)
CO2: 21 mEq/L (ref 19–32)
Chloride: 107 mEq/L (ref 96–112)
Chloride: 108 mEq/L (ref 96–112)
Chloride: 108 mEq/L (ref 96–112)
Creatinine, Ser: 1.37 mg/dL (ref 0.4–1.5)
GFR calc Af Amer: >60 mL/min (ref >60)
Glucose, Bld: 71 mg/dL (ref 70–99)
Glucose, Bld: 77 mg/dL (ref 70–99)
Potassium: 3.3 mEq/L — ABNORMAL LOW (ref 3.5–5.1)
Potassium: 4.3 mEq/L (ref 3.5–5.1)

## 2010-04-13 LAB — HEPATIC FUNCTION PANEL
AST: 38 U/L — ABNORMAL HIGH (ref 0–37)
Albumin: 4 g/dL (ref 3.5–5.2)
Bilirubin, Direct: 0.2 mg/dL (ref 0.0–0.3)
Total Protein: 7.2 g/dL (ref 6.0–8.3)

## 2010-04-13 LAB — POCT I-STAT, CHEM 8
Calcium, Ion: 1 mmol/L — ABNORMAL LOW (ref 1.12–1.32)
Chloride: 106 mEq/L (ref 96–112)
HCT: 50 % (ref 39.0–52.0)
Potassium: 3.1 mEq/L — ABNORMAL LOW (ref 3.5–5.1)

## 2010-04-13 LAB — TYPE AND SCREEN
ABO/RH(D): AB POS
Antibody Screen: NEGATIVE

## 2010-04-13 LAB — CARDIAC PANEL(CRET KIN+CKTOT+MB+TROPI): CK, MB: 1.6 ng/mL (ref 0.3–4.0)

## 2010-04-13 LAB — PROTIME-INR: INR: 1.12 (ref 0.00–1.49)

## 2010-04-13 LAB — APTT: aPTT: 30 seconds (ref 24–37)

## 2010-04-13 LAB — DIFFERENTIAL
Eosinophils Absolute: 0 10*3/uL (ref 0.0–0.7)
Lymphocytes Relative: 16 % (ref 12–46)
Lymphs Abs: 0.9 10*3/uL (ref 0.7–4.0)
Monocytes Relative: 7 % (ref 3–12)
Neutro Abs: 4.1 10*3/uL (ref 1.7–7.7)
Neutrophils Relative %: 77 % (ref 43–77)

## 2010-04-13 LAB — MAGNESIUM: Magnesium: 2.1 mg/dL (ref 1.5–2.5)

## 2010-04-13 LAB — HEMOCCULT GUIAC POC 1CARD (OFFICE): Fecal Occult Bld: POSITIVE

## 2010-04-13 LAB — ABO/RH: ABO/RH(D): AB POS

## 2010-04-21 ENCOUNTER — Other Ambulatory Visit: Payer: Self-pay | Admitting: *Deleted

## 2010-04-21 DIAGNOSIS — G4700 Insomnia, unspecified: Secondary | ICD-10-CM

## 2010-04-21 MED ORDER — ZOLPIDEM TARTRATE 5 MG PO TABS: 5.0000 mg | ORAL_TABLET | Freq: Every evening | ORAL | Status: DC | PRN

## 2010-04-28 ENCOUNTER — Ambulatory Visit (INDEPENDENT_AMBULATORY_CARE_PROVIDER_SITE_OTHER): Payer: Medicare Other

## 2010-04-28 DIAGNOSIS — J301 Allergic rhinitis due to pollen: Secondary | ICD-10-CM

## 2010-04-29 ENCOUNTER — Ambulatory Visit (INDEPENDENT_AMBULATORY_CARE_PROVIDER_SITE_OTHER)
Admission: RE | Admit: 2010-04-29 | Discharge: 2010-04-29 | Disposition: A | Discharge status: Home / Self Care | Payer: Medicare Other | Source: Ambulatory Visit | Attending: Internal Medicine | Admitting: Internal Medicine

## 2010-04-29 ENCOUNTER — Telehealth: Payer: Self-pay | Admitting: Internal Medicine

## 2010-04-29 DIAGNOSIS — R042 Hemoptysis: Secondary | ICD-10-CM

## 2010-04-29 NOTE — Telephone Encounter (Signed)
Per CY pt needs CXR today for hemoptysis. He will call with results.

## 2010-04-29 NOTE — Telephone Encounter (Signed)
Spoke w/ pt and he si going to come w/in the next hr to have cxr done and pt is aware we will call him with the results.

## 2010-04-29 NOTE — Telephone Encounter (Signed)
Pt states he has been coughing up bright red blood x 2-3 days but it seems to be better today. He says his chest hurts on the right side and in his back. He denies any noticeable SOB or wheezing. He would like to be seen today if possible and have a cxr. Pls advise if we can work pt in to see CDY.  NKDA.

## 2010-05-03 ENCOUNTER — Ambulatory Visit: Payer: Medicare Other | Admitting: Internal Medicine

## 2010-05-06 ENCOUNTER — Ambulatory Visit (INDEPENDENT_AMBULATORY_CARE_PROVIDER_SITE_OTHER): Payer: Medicare Other

## 2010-05-06 DIAGNOSIS — J309 Allergic rhinitis, unspecified: Secondary | ICD-10-CM

## 2010-05-11 ENCOUNTER — Encounter: Payer: Self-pay | Admitting: Internal Medicine

## 2010-05-11 ENCOUNTER — Ambulatory Visit (INDEPENDENT_AMBULATORY_CARE_PROVIDER_SITE_OTHER): Payer: Medicare Other | Admitting: Internal Medicine

## 2010-05-11 DIAGNOSIS — I2510 Atherosclerotic heart disease of native coronary artery without angina pectoris: Secondary | ICD-10-CM

## 2010-05-11 DIAGNOSIS — M2550 Pain in unspecified joint: Secondary | ICD-10-CM

## 2010-05-11 DIAGNOSIS — I10 Essential (primary) hypertension: Secondary | ICD-10-CM

## 2010-05-11 LAB — BASIC METABOLIC PANEL WITH GFR
BUN: 18 mg/dL (ref 6–23)
CO2: 26 meq/L (ref 19–32)
Calcium: 9 mg/dL (ref 8.4–10.5)
Chloride: 101 meq/L (ref 96–112)
Creatinine, Ser: 1.09 mg/dL (ref 0.4–1.5)
GFR calc non Af Amer: >60 mL/min
Glucose, Bld: 112 mg/dL — ABNORMAL HIGH (ref 70–99)
Potassium: 3.6 meq/L (ref 3.5–5.1)
Sodium: 136 meq/L (ref 135–145)

## 2010-05-11 LAB — DIFFERENTIAL
Basophils Absolute: 0.1 K/uL (ref 0.0–0.1)
Basophils Relative: 1 % (ref 0–1)
Eosinophils Absolute: 0.1 K/uL (ref 0.0–0.7)
Eosinophils Relative: 2 % (ref 0–5)
Lymphocytes Relative: 29 % (ref 12–46)
Lymphs Abs: 1.7 K/uL (ref 0.7–4.0)
Monocytes Absolute: 0.6 K/uL (ref 0.1–1.0)
Monocytes Relative: 10 % (ref 3–12)
Neutro Abs: 3.6 K/uL (ref 1.7–7.7)
Neutrophils Relative %: 59 % (ref 43–77)

## 2010-05-11 LAB — CBC
HCT: 35.8 % — ABNORMAL LOW (ref 39.0–52.0)
Hemoglobin: 12.2 g/dL — ABNORMAL LOW (ref 13.0–17.0)
MCHC: 34 g/dL (ref 30.0–36.0)
MCV: 87.3 fL (ref 78.0–100.0)
Platelets: 270 10*3/uL (ref 150–400)
RDW: 13.8 % (ref 11.5–15.5)

## 2010-05-11 NOTE — Assessment & Plan Note (Signed)
Controlled Continue meds 

## 2010-05-11 NOTE — Assessment & Plan Note (Signed)
No sxs Continue current meds 

## 2010-05-11 NOTE — Progress Notes (Signed)
  Subjective:    Patient ID: Corey [name], male    DOB: Mar 21, 1935, 75 y.o.   MRN: [id number]  HPI  Complicated patient with recent confusing medical hx: Recent hospitalization: unclear etiology Review of rheumatology: unclear etiology---note ESR. He has some residual joint pain: shoulders---much improved  Htn: tolerating meds  AFIB: no recurrence  CAD: followed by cardiology  Past Medical History  Diagnosis Date  . Alcohol abuse, episodic 05/19/2008  . ALLERGIC RHINITIS 04/15/2009  . ASTHMA 11/16/2006  . Atrial fibrillation 11/16/2006  . COLONIC POLYPS, ADENOMATOUS, HX OF 02/14/2008  . CORONARY ARTERY DISEASE 11/16/2006  . DIVERTICULOSIS, COLON 02/14/2008  . ESOPHAGEAL STRICTURE 01/28/2008  . GERD 02/14/2008  . HYPERGLYCEMIA 11/16/2006  . HYPERLIPIDEMIA 02/14/2008  . HYPERTENSION 11/16/2006  . INSOMNIA-SLEEP DISORDER-UNSPEC 02/11/2009  . MYOCARDIAL INFARCTION, HX OF 11/16/2006  . OBSTRUCTIVE SLEEP APNEA 11/10/2008  . 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 PROSTATE CANCER, HX OF 02/25/2000  . SLEEP APNEA 10/03/2009  . TRANSIENT ISCHEMIC ATTACKS, HX OF 11/16/2006   Past Surgical History  Procedure Date  . Uvulopalatopharyngoplasty   . Tonsilectomy, adenoidectomy, bilateral myringotomy and tubes   . Knee arthroscopy     2 surgeries right and 1 left  . Prostate surgery 2/02    prostate cancer    reports that he quit smoking about 46 years ago. His smoking use included Cigarettes. He quit smokeless tobacco use about 46 years ago. He reports that he drinks alcohol. He reports that he does not use illicit drugs. family history includes Heart attack in his brother, father, and maternal uncle and Heart disease in his maternal uncle. No Known Allergies   Review of Systems  patient denies chest pain, shortness of breath, orthopnea. Denies lower extremity edema, abdominal pain, change in appetite, change in bowel movements. Patient denies rashes, musculoskeletal complaints. No  other specific complaints in a complete review of systems.      Objective:   Physical Exam  well-developed well-nourished male in no acute distress. HEENT exam atraumatic, normocephalic, neck supple without jugular venous distention. Chest clear to auscultation cardiac exam S1-S2 are regular. Abdominal exam overweight with bowel sounds, soft and nontender. Extremities no edema. Neurologic exam is alert with a normal gait.        Assessment & Plan:

## 2010-05-11 NOTE — Assessment & Plan Note (Signed)
sxs have essentially resolved ? Viral, ? inflammatory

## 2010-05-13 ENCOUNTER — Ambulatory Visit (INDEPENDENT_AMBULATORY_CARE_PROVIDER_SITE_OTHER): Payer: Medicare Other

## 2010-05-13 ENCOUNTER — Encounter: Payer: Self-pay | Admitting: Internal Medicine

## 2010-05-13 DIAGNOSIS — J309 Allergic rhinitis, unspecified: Secondary | ICD-10-CM

## 2010-05-19 ENCOUNTER — Other Ambulatory Visit: Payer: Self-pay | Admitting: Internal Medicine

## 2010-05-26 ENCOUNTER — Ambulatory Visit (INDEPENDENT_AMBULATORY_CARE_PROVIDER_SITE_OTHER): Payer: Medicare Other

## 2010-05-26 DIAGNOSIS — J309 Allergic rhinitis, unspecified: Secondary | ICD-10-CM

## 2010-06-02 ENCOUNTER — Encounter: Payer: Self-pay | Admitting: Internal Medicine

## 2010-06-02 ENCOUNTER — Ambulatory Visit (INDEPENDENT_AMBULATORY_CARE_PROVIDER_SITE_OTHER): Payer: Medicare Other | Admitting: Internal Medicine

## 2010-06-02 VITALS — BP 142/84 | Temp 97.8°F | Wt 187.0 lb

## 2010-06-02 DIAGNOSIS — R61 Generalized hyperhidrosis: Secondary | ICD-10-CM

## 2010-06-02 DIAGNOSIS — R509 Fever, unspecified: Secondary | ICD-10-CM

## 2010-06-02 NOTE — Progress Notes (Signed)
  Subjective:    Patient ID: Corey [name], male    DOB: [birth year], 75 y.o.   MRN: [id number]  HPI  Has a few lesions on face---under chin and posterior scalp  3 nights ago with night sweats---chills, shivering, "cold"---has resolved. Had significant feeling of weakness  CAD--no sxs  Hemoptysis---typically happens after taking advair---this has been ongoing for years---previous eval by dr. Maple Hudson.   Past Medical History  Diagnosis Date  . Alcohol abuse, episodic 05/19/2008  . ALLERGIC RHINITIS 04/15/2009  . ASTHMA 11/16/2006  . Atrial fibrillation 11/16/2006  . COLONIC POLYPS, ADENOMATOUS, HX OF 02/14/2008  . CORONARY ARTERY DISEASE 11/16/2006  . DIVERTICULOSIS, COLON 02/14/2008  . ESOPHAGEAL STRICTURE 01/28/2008  . GERD 02/14/2008  . HYPERGLYCEMIA 11/16/2006  . HYPERLIPIDEMIA 02/14/2008  . HYPERTENSION 11/16/2006  . INSOMNIA-SLEEP DISORDER-UNSPEC 02/11/2009  . MYOCARDIAL INFARCTION, HX OF 11/16/2006  . OBSTRUCTIVE SLEEP APNEA 11/10/2008  . 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 PROSTATE CANCER, HX OF 02/25/2000  . SLEEP APNEA 10/03/2009  . TRANSIENT ISCHEMIC ATTACKS, HX OF 11/16/2006   Past Surgical History  Procedure Date  . Uvulopalatopharyngoplasty   . Tonsilectomy, adenoidectomy, bilateral myringotomy and tubes   . Knee arthroscopy     2 surgeries right and 1 left  . Prostate surgery 2/02    prostate cancer    reports that he quit smoking about 46 years ago. His smoking use included Cigarettes. He quit smokeless tobacco use about 46 years ago. He reports that he drinks alcohol. He reports that he does not use illicit drugs. family history includes Heart attack in his brother, father, and maternal uncle and Heart disease in his maternal uncle. No Known Allergies   Review of Systems  patient denies chest pain, shortness of breath, orthopnea. Denies lower extremity edema, abdominal pain, change in appetite, change in bowel movements. Patient denies rashes,  musculoskeletal complaints. No other specific complaints in a complete review of systems.      Objective:   Physical Exam  well-developed well-nourished male in no acute distress. HEENT exam atraumatic, normocephalic, neck supple without jugular venous distention. Chest clear to auscultation cardiac exam S1-S2 are regular. Abdominal exam overweight with bowel sounds, soft and nontender. Extremities no edema. Neurologic exam is alert with a normal gait.        Assessment & Plan:  Patient has an unusual constellation of findings. These includes night sweats. He had significant myalgias a couple of months ago. He was evaluated for connective tissue disease. It is unclear what is causing Mr. Seifer' symptoms. He is feeling significantly better than he did several weeks ago. I will check some laboratory work today. I don't think any further evaluation necessary other than the laboratory work. I don't think any specific treatment indicated at this time. The patient will follow up with me if he does not continue to improve for his night sweats worsen.

## 2010-06-04 ENCOUNTER — Other Ambulatory Visit: Payer: Self-pay | Admitting: Dermatology

## 2010-06-07 ENCOUNTER — Ambulatory Visit (INDEPENDENT_AMBULATORY_CARE_PROVIDER_SITE_OTHER): Payer: Medicare Other

## 2010-06-07 DIAGNOSIS — J309 Allergic rhinitis, unspecified: Secondary | ICD-10-CM

## 2010-06-08 ENCOUNTER — Ambulatory Visit (INDEPENDENT_AMBULATORY_CARE_PROVIDER_SITE_OTHER): Payer: Medicare Other

## 2010-06-08 DIAGNOSIS — J309 Allergic rhinitis, unspecified: Secondary | ICD-10-CM
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
## 2010-06-08 NOTE — Assessment & Plan Note (Signed)
[facility]                                 ON-CALL NOTE   NAME:Corey [name], Corey [name]                     MRN:          [id number]  DATE:10/19/2009                            DOB:          Apr 16, 1935    This is a 75 year old pleasant white male with apparently history of  asthma and allergy-type lung disease who essentially called because  since around 6:30 p.m. approximately 6 hours ago, he has been having a  cough productive of some clear secretions.  He denies fever, denies  chest pain, denies any kind of palpitations reflective of atrial  fibrillation.  He supposedly has had a history of atrial fibrillation  seen by a cardiologist in the past, but knows when he is in atrial  fibrillation.  He does not think he is in atrial fibrillation right now.  He attempted to use his ProAir HFA as well as hour and half later using  a nebulizer albuterol and was concerned if there was any significant  issue with the frequency of utilizing these two agents.  The patient has  not been lying flat, but see Dr. Lina Sar for concerns of GERD for  which he was taking Carafate in the past.  Currently now, he is in no  distress at all, speaks full sentences.   Denies any active symptomatology except for intermittent cough and thin  secretions being coughed up.  He denies any aspiration event recently,  does note that he has allergic issues intermittently, but never takes  Benadryl.  He also has been having some insomnia for which he takes  Ambien.  He denies nausea and vomiting.  Essentially, the patient has  been suffering from some persistent cough basically, for over 6 hours.  I recommended the patient be evaluated in the Urgent Care Center, he did  not want to go to any Urgent Care Center or emergency room.  We have  gone through his medication list.  I assured him that I do not feel like  the ProAir HFA and an hour and half later a nebulized albuterol  treatment  cause any significant decline in his symptomatology or issues.  He did find to have some guaifenesin and he will be taking some  guaifenesin to try to thin up secretions that he is having.  He wanted  to take a cough suppression of codeine, I told him that was probably not  a good idea to suppress his cough.  He is coughing for a reason right  now.  I do have concerns about aspiration still, although he declines  any kind of significant timing of an event that would correlate with  this.  Also very concerned about GERD and reflux causing this  irritability cough.  He tells me he will follow up with Dr. Lina Sar  regarding this and Dr. Jetty Duhamel and again he has declined going to  emergency room or Urgent Care Center.  I told him it might be reasonable  to go to the Urgent Care Center, where they may be able to monitor to  make  sure he does not have any incident atrial fibrillation and  symptomatology directly related to, he feels strongly that he has a  pulse of 65-70 which is regular and he knows when he has been in the  past, which was years ago.   PLAN:  For this patient again, I recommended Urgent Care evaluation, he  has declined that.  He will be taking guaifenesin at home and calling  back for any worsening concerns.     Nelda Bucks, MD    DJF/MedQ  DD: 10/19/2009  DT: 10/19/2009  Job #: [id number]   cc:   Joni Fears D. Maple Hudson, MD, FCCP, FACP

## 2010-06-08 NOTE — Assessment & Plan Note (Signed)
[facility]                                 ON-CALL NOTE   NAME:GAINESKeiondre, [name]                       MRN:          [id number]  DATE:07/12/2006                            DOB:          March 24, 1935    TELEPHONE ENCOUNTER:  His telephone [phone].   Mr. Gains is a patient of Dr. Joni Fears D. Young's.  He is treated for  obstructive sleep apnea and asthma.   MAINTENANCE MEDICINES:  1. Advair 500/50 mg.  2. Accolate.  3. Xopenex p.r.n.  He states to me that until this morning he has not      used the Xopenex in almost one year.   Last night he had difficulty tolerating CPAP, due to dyspnea and cough.  This morning, at approximately 5 a.m. he took one inhalation of Xopenex  and felt that this might have made his cough worse.  He continues to  have shortness of breath, cough and chest tightness.  Over the phone he  is speaking in full sentences and does not sound like he is breathless.   PLAN/RECOMMENDATIONS:  I recommend that he take another inhalation of  Xopenex now.  I told him that I would notify Dr. Maple Hudson this morning and  that Mr. Gains probably needs to be seen in the office sometime today.  Dr. Maple Hudson will call him back and if he has not heard from Dr.  Maple Hudson or  from our office by 9 a.m., Mr. Gains is to call the office number to  arrange followup.     Oley Balm Sung Amabile, MD  Electronically Signed    DBS/MedQ  DD: 07/12/2006  DT: 07/12/2006  Job #: [id number]   cc:   Joni Fears D. Maple Hudson, MD, FCCP, FACP

## 2010-06-08 NOTE — Assessment & Plan Note (Signed)
[facility]                                 ON-CALL NOTE   NAME:Murdaugh, SYLVIA KONDRACKI                     MRN:          [id number]  DATE:09/29/2009                            DOB:          August 16, 1935    Patient of Dr. Jetty Duhamel   Phone #: [phone]   SUBJECTIVE:  Mr. Satz is a 75 year old patient of Dr. Jetty Duhamel.  He has been followed for approximately 30 years for asthma-type  symptoms.  At lunch on the day of this encounter, he aspirated some ice  tea while out on a business lunch.  He had difficulty catching his  breath and for several minutes coughed and sputtered.  He had  significant enough symptoms that he actually cancelled further business  meetings later in the afternoon.  He called me at approximately 11:15  p.m. on the day of this encounter to report the persistent of symptoms  that actually woke him up from sleep.  He really described no  significant shortness of breath and the cough was largely resolved  except for a sensation of needing to clear his throat from time to time.  However, he was concerned that he did not feel right.  During that  discussion, he was able to speak in full sentences and sounded mildly  anxious, but otherwise in no distress.   PLAN:  I recommended that we continue with strategy of watchful waiting.  I did not see a need for him to be evaluated in an Urgent Care or in the  emergency room on this night.  However, he is to call me back if his  symptoms persist or get worse.  If they persist into the morning, he is  to call our office to schedule an appointment with Dr. Jetty Duhamel,  and I suggest that he might perhaps need a flexible laryngoscopy.     Oley Balm Sung Amabile, MD     DBS/MedQ  DD: 09/29/2009  DT: 09/30/2009  Job #: [id number]

## 2010-06-08 NOTE — Assessment & Plan Note (Signed)
[facility]                             PULMONARY OFFICE NOTE   NAME:Corey [name], Corey [name]                     MRN:          [id number]  DATE:11/29/2006                            DOB:          [birth year]    PROBLEMS:  1. Asthma.  2. Allergic rhinitis.  3. Obstructive sleep apnea/CPAP.  4. History of atrial fibrillation.  5. Septal deviation.  6. Granulomatous scarring.   HISTORY:  For most of the past 2 weeks, he has been bothered by  increasing postnasal drainage, throat clearing that interferes with  sleep at night, nonproductive or scant white sputum.  No fever, chest  congestion, or purulent discharge.  He is about to fly to [facility].  He  has had flu vaccine, and in the past has had pneumococcal vaccine twice.   MEDICATIONS:  1. Advair 500/50.  2. Accolade 20 mg b.i.d.  3. Tambocor 100 mg b.i.d.  4. Altace 4 mg.  5. Norvasc 5 mg.  6. Aspirin 81 mg.  7. Prevacid 30 mg.  8. Zocor 20 mg.  9. Toprol-XL 25 mg.  10.Plavix 75 mg.  11.CPAP 7 CWP.  12.HCTZ 25 mg.  13.Xopenex HFA rescue inhaler p.r.n.  14.Flonase p.r.n.   ALLERGIES:  DRUG INTOLERANT TO NEOMYCIN AND SULFA.   OBJECTIVE:  VITAL SIGNS:  Weight 196 pounds.  Blood pressure 108/70,  pulse 57, room air saturation 94%.  Pulse feels regular.  HEENT:  He is  status post palatoplasty.  Pharynx is minimally reddened with some scant  white mucus posteriorly.  Voice quality is normal.  There is no stridor.  Nasal mucosa is wet but not irritated.  CHEST:  Quiet without wheeze or cough.   IMPRESSION:  Rhinitis.  Progress is slow; otherwise, I would suspect  this was an early viral upper respiratory infection.   PLAN:  Neo-Synephrine inhalation, Depo-Medrol 80 mg IM, sample Patanase  once each nostril b.i.d. p.r.n.  Consider saline nasal gel, especially  while in the dry air of [facility].  Keep return appointment as  scheduled, earlier p.r.n.     Clinton D. Maple Hudson, MD, Tonny Bollman, FACP  Electronically Signed    CDY/MedQ  DD: 11/29/2006  DT: 11/30/2006  Job #: [id number]   cc:   Gerlene Burdock A. Alanda Amass, M.D.  Bruce Rexene Edison Swords, MD
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
## 2010-06-11 NOTE — Discharge Summary (Signed)
[facility]  Patient:    Corey [name], Corey [name]                     MRN: [id number] Adm. Date:  [id number] Disc. Date: 03/12/00 Attending:  Katherine Roan                           Discharge Summary  DISCHARGE DIAGNOSES: 1. T2A, N0, Gleason 3+3 adenocarcinoma of the prostate. 2. Asthma. 3. Cardiovascular heart disease with hypertension.  OPERATIONS AND PROCEDURES:  Radical retropubic prostatectomy and bilateral pelvic lymph node dissection on March 08, 2000.  COMPLICATIONS:  Rectal injury at time of surgery.  HISTORY OF PRESENT ILLNESS:  This 75 year old white male is admitted with early B1, Gleason 3+3 adenocarcinoma of the prostate for radical surgery. Biopsy 1201 showed a 30 g prostate, Gleason 3+3 with right-sided tumor with 5% of the biopsy positive on the right side.  His PSA was 2.2 in September 2000, 4.5 in February 2001, 3.2 in May 2001, 3.6 in November 2001.  He considered brachytherapy, but has decided upon surgery, understanding the risks, including, but not limited to incontinence, impotence, deep venous thrombosis, pulmonary emboli, bleeding, and death.  He auto-donated 2 units of blood, and had a mechanical bowel prep the day before admission.  LABORATORY DATA:  Admission hematocrit was 40%, white count 5000.  His renal function tests were normal with a BUN of 12, creatinine 1.1.  HOSPITAL COURSE:  He was taken to the operating room where he underwent a radical prostatectomy and bilateral lymph node dissection.  There was a rectal injury which was identified at surgery and closed primarily with two layers. Postoperatively, his hematocrit fell to 26% on the second postoperative day, so I ordered some autologous blood.  He had one unit that when halfway in caused him to have a fever of 101, and he was given Benadryl and the blood continued.  He had a similar reaction with the second unit after about 100 cc, and this unit was  discontinued.  His hematocrit afterwards was 34%, his white count remained normal at 5800.  Renal function tests were normal.  Because of his rectal injury, he was kept on clear liquids, but had low-grade fever to 101 each of the last three days.  His JP drain was removed on the third postoperative day when it was down to less than 20 cc.  He did have a bowel movement on the third postoperative day, and was discharged on the fourth with Cipro 500 mg p.o. b.i.d., a full liquid diet, and Percocet for pain as needed. He will come to the office in three days for followup, and have his stitches removed.  He will call should he have severe rectal pain or fever greater than 102.  CONDITION ON DISCHARGE:  Sent home in improved ambulatory condition as stated.DD:  03/12/00 TD:  03/12/00 Job: [id number]

## 2010-06-11 NOTE — H&P (Signed)
[facility]  Patient:    Corey [name], Corey [name]                     MRN: [id number] Adm. Date:  [id number] Disc. Date: [id number] Attending:  Mervin Hack CC:         Hedwig Morton. Juanda Chance, M.D. [facility]  Richard A. Alanda Amass, M.D.   History and Physical  HISTORY OF PRESENT ILLNESS:  This 75 year old white male is admitted with early B1, Gleason 3-plus-3 adenocarcinoma of the prostate, for radical surgery.  He had a biopsy, December 2001, which showed a 30 g prostate, Gleason 3-plus-3 right-sided lesion with less than 5% of the biopsy involved with cancer.  His PSA was 2.2, September 2000; 4.5, February 2001; 3.2, May 2001; and 3.6, November 2001.  He consider brachytherapy but has decided upon surgery, understanding the risks including, but not limited to, incontinence, impotence, deep venous thrombosis, pulmonary emboli, bleeding and death.  He auto-donated two units of blood and did a mechanical bowel prep on March 07, 2000.  MEDICATIONS 1. Tambocor 100 mg p.o. b.i.d. 2. Lanoxin 0.125 mg one-half tablet daily. 3. Zocor 20 mg a day. 4. Prevacid as needed. 5. Altace 5 mg p.o. q.d. 6. Norvasc 5 mg p.o. q.d.  ALLERGIES:  He has no allergies.  PREVIOUS SURGERY:  Knee operations x 3, 1958, 1964 and 1994.  He had ______ as a child.  REVIEW OF SYSTEMS:  He gets up at least once or twice a night.  He has never had a UTI.  He did have an MI in 1993 but has had remote normal coronaries, a distant congestive cardiomyopathy, etiology unknown.  He has a slight enlarged LV with 50% EF and mild prolapse of his mitral valve anteriorly with normal pulmonary pressures.  He is a nonsmoker.  He has a history of diverticulitis with negative biopsies in the past.  Right lower quadrant pain, January 2002, of unknown etiology.  He drinks a few beers and/or drinks daily.  Does use CPAP for sleep apnea.  Does have history of asthma.  He has a distant history of a TIA but on  Plavix therapy, he has done well but he discontinued the latter medication a week ago.  Beta blockers caused impotence in the past.  FAMILY HISTORY AND SOCIAL HISTORY:  He is married, works for [employer] and does a good bit of traveling.  He also has two race cars and was in [place] last weekend.  His wife, Corey [name], is in good general health.  His oldest daughter, Corey [name], died at [facility] of unknown etiology.  He has another daughter, Corey [name], who is at [employer] as a senior and is thinking about law school, and daughter, Corey [name], is living at home and a son, Corey [name], is also at home.  He had a brother who died of an MI at age 31.  PHYSICAL EXAMINATION  VITAL SIGNS:  His temperature is afebrile, weight 181 pounds, blood pressure 136/78, pulse 66.  GENERAL:  He is a balding, pleasant, ruddy-complexioned, middle-aged white male in no acute distress.  HEENT:  Clear.  CHEST:  Clear to percussion and auscultation with no murmurs, gallops or rubs.  ABDOMEN:  Soft without organomegaly or masses.  GU:  He does have a circumcised normal penis, bilateral slight hydroceles.  RECTAL:  A 30 g prostate and no fixation or nodules.  He has good anal sphincter tone and perineal sensation intact.  EXTREMITIES:  He has no edema  of the lower extremities, good distal pulses, intact sensation to light touch, with no edema.  IMPRESSION 1. B1 right-sided adenocarcinoma of the prostate, Gleason 3 plus 3. 2. Slightly elevated prostate-specific antigen at 3.6. 3. Asthma with history of sleep apnea. 4. History of myocardial infarction and transient ischemic attack, presently    quiet.  RECOMMENDATION:  Recommend radical retropubic prostatectomy with bilateral pelvic lymph node dissection, as discussed. DD:  03/07/00 TD:  03/08/00 Job: [id number]

## 2010-06-11 NOTE — Consult Note (Signed)
[facility]  Patient:    Corey [name], Corey [name]                     MRN: [id number] Adm. Date:  [id number] Attending:  Osvaldo Human CC:         Richard A. Alanda Amass, M.D.                          Consultation Report  I have the pleasure of seeing Mr. Corey [name] in the emergency room at [facility].  The patient had earlier sustained a fall with injury to the thumb. The patient relates to me that over the last 12-24 hours he has had significant pain at the ulnar aspect of his thumbs, swelling and loss of function due to his injury.  He denies numbness or tingling.  He had a recent contusion to the index and middle finger a week or so ago, but this was not significantly painful.  Due to continued pain, he presents for evaluation and treatment of his right thumb.  He denies other injuries.  PAST MEDICAL HISTORY:  Myocardial infarction, mild diverticulitis, asthma and hypertension.  PAST SURGICAL HISTORY:  Four knee surgeries, tonsillectomy as well.  ALLERGIES:  Questionable SULFA allergy as a child.  MEDICINES:  Tamacor, Norvasc.  SOCIAL HISTORY:  He is a nonsmoker.  PHYSICAL EXAMINATION: This is a very pleasant white male in no acute distress. Patient has normal lower extremity examination.  The left upper extremity is nontender.  NECK, BACK AND CHEST: Benign.  HEENT: Within normal limits.  EXTREMITIES:  The right thumb has soft tissue swelling with a normal sensation and refill distally.  There is no sign or symptoms of compartment syndrome DVT or other vascular abnormality.  He is tender at the ulna collateral ligament region at the MCP joint of his thumb as expected based upon his radiographs, which were reviewed previous to exam.  ATL and FTL are intact.  There are no signs or symptoms of infection.  X-rays were reviewed today, which show a bony gamekeepers fracture about the ulnar aspect of the thumb MCP joint, which is fairly  non-displaced and inarticulate.  IMPRESSION:  Metacarpophalangeal joint inarticulate fracture at the base of the proximal phalanx and ulna without significant displacement.  PLAN:  I have placed a well-molded splint to my satisfaction.  I have discussed with him the injury and have drawn him anatomical pictures and gone over all issues.  We will see him back in the office in 7-10 days for x-rays and casting.  All questions were encouraged and answered.  He was given Vicodin 1-2 q.4-6 p.r.n. pain p.o., #20, with no refill.  He will ice and elevate and notify me if any problems should any problems occur. DD:  06/30/99 TD:  07/01/99 Job: [id number]

## 2010-06-11 NOTE — Assessment & Plan Note (Signed)
[facility]                             PULMONARY OFFICE NOTE   NAME:Corey [name], Corey [name]                     MRN:          [id number]  DATE:04/27/2006                            DOB:          March 29, 1935    PROBLEM:  1. Asthma.  2. Allergic rhinitis.  3. Obstructive sleep apnea/continuous positive airway pressure      discontinued.  4. Paroxysmal atrial tachycardia/cardioversion.  5. Septal deviation.  6. Granulomatous scarring.   HISTORY:  He says pollen is catching up with him with watery eyes and  nose.  He says it is not bad enough to need antihistamines, that he  continued vaccine 1:10, which is satisfactory.  He is using over-the-  counter allergy eye drops, no Optivar.  Rare mild chest tightness, but  albuterol has been sufficient.  He is also satisfied with Accolate and  generally sends me a message that he does not want to make changes  today.  He is trying to walk 5 miles 3 times a week in under an hour.   MEDICATIONS:  1. Advair 500/50.  2. Accolate 20 mg b.i.d.  3. Tambocor 100 mg b.i.d.  4. Altace 5 mg.  5. Norvasc 5 mg.  6. Aspirin 81 mg.  7. Prevacid 30 mg.  8. Zocor 20 mg.  9. Toprol 25 mg.  10.Plavix 75 mg.  11.Hydrochlorothiazide 25 mg.  12.Albuterol rescue inhaler rarely used.  13.Flonase p.r.n.   DRUG INTOLERANCES:  Maybe NEOMYCIN and SULFA, remembered from childhood.   OBJECTIVE:  Weight 190 pounds.  BP 120/74, pulse regular at 47, room air  saturation 98%.  LUNGS:  Sound clear.  Eyes look a little watery, but not inflamed.  Nasal airway is not  congested.  Heart sounds are a few irregular to brief palpation today with no murmur  heard.   IMPRESSION:  1. Stable asthma and allergic rhinitis with mild seasonal exacerbation      being watched.  2. Obstructive sleep apnea has not been a recurrent symptom problem      for him.   PLAN:  No changes to offer.  I suggested return in 1 year, earlier  p.r.n.     Clinton D. Maple Hudson, MD, Tonny Bollman, FACP  Electronically Signed    CDY/MedQ  DD: 05/01/2006  DT: 05/02/2006  Job #: [id number]   cc:   Gerlene Burdock A. Alanda Amass, M.D.  Bruce Rexene Edison Swords, MD

## 2010-06-11 NOTE — Op Note (Signed)
[facility]  Patient:    Corey [name], Corey [name]                     MRN: [id number] Proc. Date: 03/08/00 Adm. Date:  [id number] Attending:  Katherine Roan                           Operative Report  PREOPERATIVE DIAGNOSIS:   Carcinoma of the prostate.  POSTOPERATIVE DIAGNOSIS:  Carcinoma of the prostate.  OPERATION:  Radical retropubic prostatectomy, bilateral pelvic lymph node dissection.  SURGEON:  Rozanna Boer., M.D.  ASSISTANT:  Bertram Millard. Dahlstedt, M.D.  ANESTHESIA:  General  DESCRIPTION OF PROCEDURE:  The patient was placed on the operating table in the supine position.  After satisfactory induction of general endotracheal anesthesia, he was shaved, prepped and draped in the usual sterile fashion.  A roll towel was placed underneath his sacrum, and he was placed in slight flexion.  The Foley catheter was inserted and left to straight drainage.  A midline incision was made between the umbilicus and the symphysis pubis.  The retroperitoneal space was entered and the wound packed open with the Buchwalter retractor.  The pelvic nodes on the right side were first addressed.  They were taken from the external iliac down to and including the obturator fossa back to the bifurcation of the iliac clipping or tying the efferent lymphatics as they were encountered.  The nodes felt quite benign and soft.  The endopelvic fascia was then incised on the right side.  On the left side, the node packet was taken from the external iliac down to and including the obturator fossa back to the bifurcation of the iliac in a similar fashion. The efferent lymphatics were clipped or tied as they were encountered.  Again, the nodes were felt to be benign.  The endopelvic fascia was then excised on the left side.  Next, the puboprostatic ligaments were then cut sharply under direct vision and a _____ clamp passed under the dorsal vein complex and  two ties with #1 Vicryl and a back tie with 0 Vicryl suture was then made. Another 0 Vicryl was used for the bladder neck for backbleeding and the dorsal vein complex was then incised with the Bovie.  This exposed the urethra quite well.  A right angle clamp was placed underneath this and elevated up with umbilical tape.  The nerves were then dissected off of the prostate before we cut the urethra on both sides laterally.  The urethra was then cut leaving a nice long stump and the Foley catheter was then brought into the wound and pulled anteriorly for traction.  The tissue beneath the prostate and Denonvilliers fascia was then incised sharply and bluntly and the prostate was dissected up laterally, first on the right side and then the left.  The pedicle was then clamped and tied with silk sutures per routine.  We went back to the seminal vesicles on both sides.  The midline fascia was then incised and the seminal vesicles were then exposed.  We turned our attention anteriorly to the bladder neck.  With Allis clamps on this, the urethra was dissected free from the prostate and the prostate was sharply dissected off of the bladder very carefully.  When finally all that was left was the urethra, the urethra was dissected into the prostate a short distance and the urethra incised.  The Foley catheter was  then brought out from the bladder and used as traction on the prostate.  The prostate was then separated from the base of the bladder.  The vas deferens were then identified and clipped and the tips of the seminal vesicles were dissected free, clamped and tied with silk suture.  The prostate was then removed.  The bladder neck was quite good but there seemed to be inferiorly some thick tissue like there might be a little bit of adenoma.  This was cut posteriorly and sent as a separate specimen, bladder neck.  The mucosa of the bladder neck was then everted with 3-0 chromic catgut suture to  the bladder neck.  The pelvis was then irrigated and inspected.  It was at this point that a small hole in the rectum was identified down near the apex of the prostate.  The edges were clean and picked up and then a two-layer closure with running 2-0 Vicryl suture was then made to close this defect.  After the two layers were closed, the wound was again irrigated and hemostasis was thought to be quite good.  A Greenwald sound was then passed per urethra and stitches were placed on the urethra at 2, 5, 7, 10 and 12 oclock with 2-0 Vicryl suture.  A Foley catheter was then passed per urethra into the bladder and the balloon inflated to 10 cc.  The urethral stitches were then placed on the bladder neck in the corresponding area, and when these were tied down, the anastomosis was water tight and the irrigant was clear.  A J-P drain was brought through a separate stab incision on the patients right side and secured to the skin with nylon and left in the retroperitoneal space.  The wound was then closed in the midline fascia with a running #1 PDS suture and clips for the skin.  Sterile, dry dressing was applied.  The Foley and the drain were then attached to suction.  Estimated blood loss was 500 cc.  No transfusions were given.  He remained stable throughout the case.  Sponge, needle and instrument counts were correct.  He will be kept on clear liquids post discharge because of the rectal injury and will be kept on Unasyn IV. DD:  03/08/00 TD:  03/08/00 Job: [id number]

## 2010-06-11 NOTE — Assessment & Plan Note (Signed)
[facility]                               PULMONARY OFFICE NOTE   NAME:Corey [name], Corey [name]                       MRN:          [id number]  DATE:10/31/2005                            DOB:          November 17, 1935    PROBLEMS:  1. Asthma.  2. Allergic rhinitis.  3. Obstructive sleep apnea/CPAP (discontinued).  4. Paroxysmal atrial fibrillation/cardioversion.  5. Septal deviation.  6. Granulomatous scarring.   HISTORY:  He says he feels very stable but that his wife thinks his asthma  may have been a little bit worse this summer due to the air quality.  Apparently they do not see very much that is quite specific, but he does  notice that his eyes tend to water a lot.  He has had nothing purulent, no  significant palpitation, chest pain, or acute event.  He continues allergy  vaccine at 1:10.  He remains off of CPAP.   MEDICATION:  1. Advair 500/50.  2. Accolate 20 mg b.i.d.  3. Tambocor 100 mg b.i.d.  4. Altace 5 mg.  5. Norvasc 5 mg.  6. Aspirin 81 mg.  7. Prevacid 30 mg.  8. Zocor 20 mg.  9. Toprol-XL 25 mg.  10.Plavix 75 mg.  11.Optivar.  12.Hydrochlorothiazide 25 mg.  13.Metered albuterol, rarely needed.  14.Flonase.   DRUG INTOLERANCE:  Questionably of NEOMYCIN and SULFA.   OBJECTIVE:  Weight 188 pounds, BP 110/70, pulse regular but slow at 46, room  air saturation 97%.  Eyes are a bit watery consistent with overflow but  there is no conjunctival injection, minimal watery sniffing.  No stridor or  neck vein distension.  Lung fields are very clear.  Heart sounds are  distinctly regular.  I do not hear a murmur.  There is no peripheral edema.   IMPRESSION:  1. Asthma, stable.  2. Allergic rhinitis.  3. Possible mild allergic conjunctivitis or simple overflow lacrimation.  4. Obstructive sleep apnea is untreated at present except to the extent      that he can sleep off the flat of his back.  5. History of paroxysmal atrial  fibrillation.   PLAN:  No changes to offer.  We discussed some simple options and gave him  his flu shot.  Schedule return 6 months, earlier p.r.n.       Clinton D. Maple Hudson, MD, FCCP, FACP      CDY/MedQ  DD:  10/31/2005  DT:  11/02/2005  Job #:  [id number]   cc:   Gerlene Burdock A. Alanda Amass, M.D.

## 2010-06-14 ENCOUNTER — Other Ambulatory Visit: Payer: Self-pay | Admitting: Internal Medicine

## 2010-07-07 ENCOUNTER — Ambulatory Visit (INDEPENDENT_AMBULATORY_CARE_PROVIDER_SITE_OTHER): Payer: Medicare Other

## 2010-07-07 ENCOUNTER — Ambulatory Visit: Payer: Self-pay | Admitting: Internal Medicine

## 2010-07-07 DIAGNOSIS — J309 Allergic rhinitis, unspecified: Secondary | ICD-10-CM

## 2010-07-07 DIAGNOSIS — Z0289 Encounter for other administrative examinations: Secondary | ICD-10-CM

## 2010-07-15 ENCOUNTER — Ambulatory Visit (INDEPENDENT_AMBULATORY_CARE_PROVIDER_SITE_OTHER): Payer: Medicare Other

## 2010-07-15 DIAGNOSIS — J309 Allergic rhinitis, unspecified: Secondary | ICD-10-CM

## 2010-07-20 ENCOUNTER — Ambulatory Visit: Payer: Medicare Other | Admitting: Internal Medicine

## 2010-07-22 ENCOUNTER — Ambulatory Visit (INDEPENDENT_AMBULATORY_CARE_PROVIDER_SITE_OTHER): Payer: Medicare Other

## 2010-07-22 DIAGNOSIS — J309 Allergic rhinitis, unspecified: Secondary | ICD-10-CM

## 2010-08-05 ENCOUNTER — Ambulatory Visit (INDEPENDENT_AMBULATORY_CARE_PROVIDER_SITE_OTHER): Payer: Medicare Other

## 2010-08-05 DIAGNOSIS — J309 Allergic rhinitis, unspecified: Secondary | ICD-10-CM

## 2010-08-19 ENCOUNTER — Ambulatory Visit: Payer: Medicare Other | Admitting: Internal Medicine

## 2010-09-02 ENCOUNTER — Encounter: Payer: Self-pay | Admitting: Internal Medicine

## 2010-09-02 ENCOUNTER — Ambulatory Visit (INDEPENDENT_AMBULATORY_CARE_PROVIDER_SITE_OTHER): Payer: Medicare Other | Admitting: Internal Medicine

## 2010-09-02 ENCOUNTER — Other Ambulatory Visit: Payer: Self-pay | Admitting: Internal Medicine

## 2010-09-02 ENCOUNTER — Ambulatory Visit (INDEPENDENT_AMBULATORY_CARE_PROVIDER_SITE_OTHER): Payer: Medicare Other

## 2010-09-02 DIAGNOSIS — I4891 Unspecified atrial fibrillation: Secondary | ICD-10-CM

## 2010-09-02 DIAGNOSIS — R7309 Other abnormal glucose: Secondary | ICD-10-CM

## 2010-09-02 DIAGNOSIS — M2550 Pain in unspecified joint: Secondary | ICD-10-CM

## 2010-09-02 DIAGNOSIS — I2510 Atherosclerotic heart disease of native coronary artery without angina pectoris: Secondary | ICD-10-CM

## 2010-09-02 DIAGNOSIS — E785 Hyperlipidemia, unspecified: Secondary | ICD-10-CM

## 2010-09-02 DIAGNOSIS — J309 Allergic rhinitis, unspecified: Secondary | ICD-10-CM

## 2010-09-02 LAB — LIPID PANEL
HDL: 55.1 mg/dL (ref >39.00)
LDL Cholesterol: 61 mg/dL (ref 0–99)
Total CHOL/HDL Ratio: 2
VLDL: 20 mg/dL (ref 0.0–40.0)

## 2010-09-02 LAB — CBC WITH DIFFERENTIAL/PLATELET
Basophils Absolute: 0 10*3/uL (ref 0.0–0.1)
Basophils Relative: 0.9 % (ref 0.0–3.0)
Eosinophils Relative: 4.1 % (ref 0.0–5.0)
HCT: 39.3 % (ref 39.0–52.0)
Hemoglobin: 13.2 g/dL (ref 13.0–17.0)
Lymphocytes Relative: 39.4 % (ref 12.0–46.0)
Lymphs Abs: 1.5 10*3/uL (ref 0.7–4.0)
Monocytes Relative: 12.4 % — ABNORMAL HIGH (ref 3.0–12.0)
Neutro Abs: 1.6 10*3/uL (ref 1.4–7.7)
RBC: 4.41 Mil/uL (ref 4.22–5.81)
WBC: 3.7 10*3/uL — ABNORMAL LOW (ref 4.5–10.5)

## 2010-09-02 LAB — HEPATIC FUNCTION PANEL
Albumin: 4 g/dL (ref 3.5–5.2)
Total Bilirubin: 0.7 mg/dL (ref 0.3–1.2)

## 2010-09-02 LAB — BASIC METABOLIC PANEL
BUN: 15 mg/dL (ref 6–23)
Chloride: 107 mEq/L (ref 96–112)
Creatinine, Ser: 1 mg/dL (ref 0.4–1.5)
GFR: 76.15 mL/min (ref >60.00)

## 2010-09-02 LAB — SEDIMENTATION RATE: Sed Rate: 11 mm/hr (ref 0–22)

## 2010-09-02 LAB — HEMOGLOBIN A1C: Hgb A1c MFr Bld: 6 % (ref 4.6–6.5)

## 2010-09-02 NOTE — Assessment & Plan Note (Signed)
Lab Results  Component Value Date   CHOL  Value: 138        ATP III CLASSIFICATION:  <200     mg/dL   Desirable  161-096  mg/dL   Borderline High  >=045    mg/dL   High        4/0/9811   CHOL 168 12/29/2008   CHOL 170 05/19/2008   Lab Results  Component Value Date   HDL 57 03/30/2010   HDL 58.60 12/29/2008   HDL 91.47 05/19/2008   Lab Results  Component Value Date   LDLCALC  Value: 68        Total Cholesterol/HDL:CHD Risk Coronary Heart Disease Risk Table                     Men   Women  1/2 Average Risk   3.4   3.3  Average Risk       5.0   4.4  2 X Average Risk   9.6   7.1  3 X Average Risk  23.4   11.0        Use the calculated Patient Ratio above and the CHD Risk Table to determine the patient's CHD Risk.        ATP III CLASSIFICATION (LDL):  <100     mg/dL   Optimal  829-562  mg/dL   Near or Above                    Optimal  130-159  mg/dL   Borderline  130-865  mg/dL   High  >784     mg/dL   Very High 07/02/6293   LDLCALC 91 12/29/2008   LDLCALC 95 05/19/2008   Lab Results  Component Value Date   TRIG 67 03/30/2010   TRIG 93.0 12/29/2008   TRIG 78.0 05/19/2008   Lab Results  Component Value Date   CHOLHDL 2.4 03/30/2010   CHOLHDL 3 12/29/2008   CHOLHDL 3 05/19/2008   No results found for this basename: LDLDIRECT   Check labs today

## 2010-09-02 NOTE — Assessment & Plan Note (Signed)
No known recurrence 

## 2010-09-02 NOTE — Assessment & Plan Note (Signed)
No CP, SOB, PND Continue risk factor modification

## 2010-09-02 NOTE — Assessment & Plan Note (Signed)
sxs resolved Unclear explanation

## 2010-09-02 NOTE — Assessment & Plan Note (Signed)
Needs regular f/u 

## 2010-09-02 NOTE — Progress Notes (Signed)
  Subjective:    Patient ID: Corey [name], male    DOB: [birth year], 75 y.o.   MRN: [id number]  HPI  L>R knee pain. Especially with climbing steps. No swelling  Afib.-- no recurrence  htn---tolerating meds   Lipids---tolerating meds without difficulty  Past Medical History  Diagnosis Date  . Alcohol abuse, episodic 05/19/2008  . ALLERGIC RHINITIS 04/15/2009  . ASTHMA 11/16/2006  . Atrial fibrillation 11/16/2006  . COLONIC POLYPS, ADENOMATOUS, HX OF 02/14/2008  . CORONARY ARTERY DISEASE 11/16/2006  . DIVERTICULOSIS, COLON 02/14/2008  . ESOPHAGEAL STRICTURE 01/28/2008  . GERD 02/14/2008  . HYPERGLYCEMIA 11/16/2006  . HYPERLIPIDEMIA 02/14/2008  . HYPERTENSION 11/16/2006  . INSOMNIA-SLEEP DISORDER-UNSPEC 02/11/2009  . MYOCARDIAL INFARCTION, HX OF 11/16/2006  . OBSTRUCTIVE SLEEP APNEA 11/10/2008  . 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 PROSTATE CANCER, HX OF 02/25/2000  . SLEEP APNEA 10/03/2009  . TRANSIENT ISCHEMIC ATTACKS, HX OF 11/16/2006   Past Surgical History  Procedure Date  . Uvulopalatopharyngoplasty   . Tonsilectomy, adenoidectomy, bilateral myringotomy and tubes   . Knee arthroscopy     2 surgeries right and 1 left  . Prostate surgery 2/02    prostate cancer    reports that he quit smoking about 46 years ago. His smoking use included Cigarettes. He quit smokeless tobacco use about 46 years ago. He reports that he drinks alcohol. He reports that he does not use illicit drugs. family history includes Heart attack in his brother, father, and maternal uncle and Heart disease in his maternal uncle. No Known Allergies   Review of Systems  patient denies chest pain, shortness of breath, orthopnea. Denies lower extremity edema, abdominal pain, change in appetite, change in bowel movements. Patient denies rashes, musculoskeletal complaints. No other specific complaints in a complete review of systems.      Objective:   Physical Exam Well-developed male in no acute  distress. HEENT exam atraumatic, normocephalic, extraocular muscles are intact. Conjunctivae are pink without exudate. Neck is supple without lymphadenopathy, thyromegaly, jugular venous distention. Chest is clear to auscultation without increased work of breathing. Cardiac exam S1-S2 are regular. The PMI is normal. No significant murmurs or gallops. Abdominal exam active bowel sounds, soft, nontender. No abdominal bruits. Extremities no clubbing cyanosis or edema. Peripheral pulses are normal without bruits. Neurologic exam alert and oriented without any motor or sensory deficits. Rectal exam normal tone prostate normal size without masses or asymmetry.        Assessment & Plan:

## 2010-09-09 ENCOUNTER — Ambulatory Visit (INDEPENDENT_AMBULATORY_CARE_PROVIDER_SITE_OTHER): Payer: Medicare Other

## 2010-09-09 DIAGNOSIS — J309 Allergic rhinitis, unspecified: Secondary | ICD-10-CM

## 2010-09-13 ENCOUNTER — Ambulatory Visit: Payer: Medicare Other | Admitting: Internal Medicine

## 2010-09-15 ENCOUNTER — Ambulatory Visit (INDEPENDENT_AMBULATORY_CARE_PROVIDER_SITE_OTHER): Payer: Medicare Other

## 2010-09-15 DIAGNOSIS — J309 Allergic rhinitis, unspecified: Secondary | ICD-10-CM

## 2010-09-16 ENCOUNTER — Encounter: Payer: Self-pay | Admitting: Internal Medicine

## 2010-09-23 ENCOUNTER — Ambulatory Visit (INDEPENDENT_AMBULATORY_CARE_PROVIDER_SITE_OTHER): Payer: Medicare Other

## 2010-09-23 DIAGNOSIS — J309 Allergic rhinitis, unspecified: Secondary | ICD-10-CM

## 2010-09-29 ENCOUNTER — Ambulatory Visit: Payer: Medicare Other | Admitting: Internal Medicine

## 2010-09-30 ENCOUNTER — Ambulatory Visit (INDEPENDENT_AMBULATORY_CARE_PROVIDER_SITE_OTHER): Payer: Medicare Other

## 2010-09-30 DIAGNOSIS — J309 Allergic rhinitis, unspecified: Secondary | ICD-10-CM

## 2010-10-11 ENCOUNTER — Ambulatory Visit (INDEPENDENT_AMBULATORY_CARE_PROVIDER_SITE_OTHER): Payer: Medicare Other

## 2010-10-11 DIAGNOSIS — J309 Allergic rhinitis, unspecified: Secondary | ICD-10-CM

## 2010-11-01 ENCOUNTER — Encounter: Payer: Self-pay | Admitting: Internal Medicine

## 2010-11-01 ENCOUNTER — Ambulatory Visit (INDEPENDENT_AMBULATORY_CARE_PROVIDER_SITE_OTHER): Payer: Medicare Other

## 2010-11-01 ENCOUNTER — Ambulatory Visit (INDEPENDENT_AMBULATORY_CARE_PROVIDER_SITE_OTHER): Payer: Medicare Other | Admitting: Internal Medicine

## 2010-11-01 VITALS — BP 152/80 | HR 60 | Ht 73.0 in | Wt 190.8 lb

## 2010-11-01 DIAGNOSIS — J309 Allergic rhinitis, unspecified: Secondary | ICD-10-CM

## 2010-11-01 DIAGNOSIS — G4700 Insomnia, unspecified: Secondary | ICD-10-CM

## 2010-11-01 DIAGNOSIS — G4733 Obstructive sleep apnea (adult) (pediatric): Secondary | ICD-10-CM

## 2010-11-01 DIAGNOSIS — J45909 Unspecified asthma, uncomplicated: Secondary | ICD-10-CM

## 2010-11-01 NOTE — Assessment & Plan Note (Signed)
Sleep hygiene was reviewed. He will continue using one half of a generic 5 mg Ambien if needed.

## 2010-11-01 NOTE — Patient Instructions (Signed)
We are continuing present treatment. Please call as needed.  Consider trying the Breath Right nasal strips under your CPAP mask  I can send script for replacement CPAP mask or machine if needed, within the time restrictions of Medicare

## 2010-11-01 NOTE — Assessment & Plan Note (Signed)
Very mild and intermittent and rare need for rescue inhaler. He stopped Advair.

## 2010-11-01 NOTE — Assessment & Plan Note (Signed)
Good compliance and control on CPAP

## 2010-11-01 NOTE — Progress Notes (Signed)
11/01/10-75 year old male with remote smoking history, followed for chronic asthmatic bronchitis/bronchiectasis, history of sinusitis, allergic rhinitis, sleep apnea Last here 03/15/10- He wants to wait until November for flu shot. He has not had recent or acute symptoms related to his breathing and has not used Advair in 6 months. He didn't find it helpful. Rare use of Proair, maybe once every 3-4 months. He continues allergy vaccine, missing a dose sometimes if he is away from town. He notices no problems and offers no questions. He continues to use CPAP all night every night. He had had a palatoplasty he doesn't remember when and wonders if this was done at the time of his tonsillectomy as a Derrell Milanes child. He notices persistent narrowness through the right nostril related to his septal deviation. Nasonex helps. We discussed a trial of Breathe Right strips to be used under his CPAP mask. He can feel when his atrial fibrillation is more active, as in the past 2 days. He treats his difficulty initiating and maintaining sleep with a one half of a 5 mg Ambien tablet when needed.   ROS Constitutional:   No-   weight loss, night sweats, fevers, chills, fatigue, lassitude. HEENT:   No-  headaches, difficulty swallowing, tooth/dental problems, sore throat,       No-  sneezing, itching, ear ache, nasal congestion, post nasal drip,  CV:  No-   chest pain, orthopnea, PND, swelling in lower extremities, anasarca, +dizziness, palpitations Resp: No-   shortness of breath with exertion or at rest.              No-   productive cough,  No non-productive cough,  No-  coughing up of blood.              No-   change in color of mucus.  No- wheezing.   Skin: No-   rash or lesions. GI:  No-   heartburn, indigestion, abdominal pain, nausea, vomiting, diarrhea,                 change in bowel habits, loss of appetite GU: No-   dysuria, change in color of urine, no urgency or frequency.  No- flank pain. MS:  No-   joint  pain or swelling.  No- decreased range of motion.  No- back pain. Neuro- grossly normal to observation, Or:  Psych:  No- change in mood or affect. No depression or anxiety.  No memory loss.  OBJ General- Alert, Oriented, Affect-appropriate, Distress- none acute Skin- rash-none, lesions- none, excoriation- none Lymphadenopathy- none Head- atraumatic            Eyes- Gross vision intact, PERRLA, conjunctivae clear secretions            Ears- Hearing, canals-normal            Nose- Clear, ?Septal dev -narrower on the right, no-  mucus, polyps, erosion, perforation             Throat- Mallampati II/ s/p UPPP , mucosa clear , drainage- none, tonsils- atrophic Neck- flexible , trachea midline, no stridor , thyroid nl, carotid no bruit Chest - symmetrical excursion , unlabored           Heart/CV- RRR , no murmur , no gallop  , no rub, nl s1 s2                           - JVD- none , edema- none, stasis changes- none,  varices- none           Lung- clear to P&A, wheeze- none, cough- none , dullness-none, rub- none           Chest wall-  Abd- tender-no, distended-no, bowel sounds-present, HSM- no Br/ Gen/ Rectal- Not done, not indicated Extrem- cyanosis- none, clubbing, none, atrophy- none, strength- nl Neuro- grossly intact to observation, I notice repeated facial grimacing that almost suggests a tic, but it is variable.

## 2010-11-04 ENCOUNTER — Other Ambulatory Visit: Payer: Self-pay | Admitting: Internal Medicine

## 2010-11-18 ENCOUNTER — Ambulatory Visit (INDEPENDENT_AMBULATORY_CARE_PROVIDER_SITE_OTHER): Payer: Medicare Other | Admitting: Family Medicine

## 2010-11-18 ENCOUNTER — Encounter: Payer: Self-pay | Admitting: Family Medicine

## 2010-11-18 VITALS — BP 130/60 | Temp 97.8°F | Wt 194.0 lb

## 2010-11-18 DIAGNOSIS — S7002XA Contusion of left hip, initial encounter: Secondary | ICD-10-CM

## 2010-11-18 DIAGNOSIS — S7000XA Contusion of unspecified hip, initial encounter: Secondary | ICD-10-CM

## 2010-11-18 NOTE — Progress Notes (Signed)
  Subjective:    Patient ID: Corey [name], male    DOB: 10-07-1935, 75 y.o.   MRN: [id number]  HPI  Acute visit. Patient fell one week ago today. Coming down garage steps and foot caught onto the door that was closing and he went down. Landed on the hip and left side. No difficulty with ambulation. Large bruise lateral aspect of hip. No difficulty with ambulation at this time. No head injury. Denies upper extremity injury. History of sciatica and had some mild low back pain. No lower extremity weakness. Pain is mild and not noted at rest   Review of Systems  Respiratory: Negative for shortness of breath.   Cardiovascular: Negative for chest pain.  Musculoskeletal: Negative for gait problem.  Neurological: Negative for dizziness and headaches.       Objective:   Physical Exam  Constitutional: He is oriented to person, place, and time. He appears well-developed and well-nourished.  Cardiovascular: Normal rate and regular rhythm.   Pulmonary/Chest: Effort normal and breath sounds normal.  Musculoskeletal:       Good range of motion left hip. He has a large bruise lateral aspect left hip over an area approximately 20 x 25 cm. No hematoma. Good range of motion knees.  Neurological: He is alert and oriented to person, place, and time. No cranial nerve deficit.          Assessment & Plan:  Large contusion left lateral hip. No difficulty with ambulation. No clinical suspicion for hip fracture. At this point observe. Followup as needed

## 2010-11-25 ENCOUNTER — Ambulatory Visit (INDEPENDENT_AMBULATORY_CARE_PROVIDER_SITE_OTHER): Payer: Medicare Other

## 2010-11-25 DIAGNOSIS — J309 Allergic rhinitis, unspecified: Secondary | ICD-10-CM

## 2010-12-22 ENCOUNTER — Ambulatory Visit (INDEPENDENT_AMBULATORY_CARE_PROVIDER_SITE_OTHER): Payer: Medicare Other

## 2010-12-22 DIAGNOSIS — J309 Allergic rhinitis, unspecified: Secondary | ICD-10-CM

## 2010-12-22 DIAGNOSIS — Z23 Encounter for immunization: Secondary | ICD-10-CM

## 2010-12-29 ENCOUNTER — Telehealth: Payer: Self-pay | Admitting: Internal Medicine

## 2010-12-29 DIAGNOSIS — M25569 Pain in unspecified knee: Secondary | ICD-10-CM

## 2010-12-29 DIAGNOSIS — M25559 Pain in unspecified hip: Secondary | ICD-10-CM

## 2010-12-29 NOTE — Telephone Encounter (Signed)
Pt came by office and has schd an ov for 02/02/11 at 10:30am, to discuss some discomfort that pt has been experiencing in knee and hip. Pt would prefer to get ov sooner, but would like to get a call back from Dr Cato Mulligan if possible to discuss problems.

## 2010-12-30 ENCOUNTER — Ambulatory Visit (INDEPENDENT_AMBULATORY_CARE_PROVIDER_SITE_OTHER): Payer: Medicare Other

## 2010-12-30 DIAGNOSIS — J309 Allergic rhinitis, unspecified: Secondary | ICD-10-CM

## 2010-12-31 NOTE — Telephone Encounter (Signed)
Pt aware, order placed 

## 2010-12-31 NOTE — Telephone Encounter (Signed)
Send for an xray of affected hip and knee

## 2011-01-03 ENCOUNTER — Ambulatory Visit (INDEPENDENT_AMBULATORY_CARE_PROVIDER_SITE_OTHER)
Admission: RE | Admit: 2011-01-03 | Discharge: 2011-01-03 | Disposition: A | Discharge status: Home / Self Care | Payer: Medicare Other | Source: Ambulatory Visit | Attending: Internal Medicine | Admitting: Internal Medicine

## 2011-01-03 DIAGNOSIS — M25569 Pain in unspecified knee: Secondary | ICD-10-CM

## 2011-01-03 DIAGNOSIS — M25559 Pain in unspecified hip: Secondary | ICD-10-CM

## 2011-01-04 ENCOUNTER — Other Ambulatory Visit: Payer: Self-pay | Admitting: Internal Medicine

## 2011-01-05 ENCOUNTER — Ambulatory Visit (INDEPENDENT_AMBULATORY_CARE_PROVIDER_SITE_OTHER): Payer: Medicare Other

## 2011-01-05 DIAGNOSIS — J309 Allergic rhinitis, unspecified: Secondary | ICD-10-CM

## 2011-01-07 ENCOUNTER — Telehealth: Payer: Self-pay | Admitting: Internal Medicine

## 2011-01-07 NOTE — Telephone Encounter (Signed)
LMTCB

## 2011-01-07 NOTE — Telephone Encounter (Signed)
Pt is req to get xray results. Pls call asap.

## 2011-01-07 NOTE — Telephone Encounter (Signed)
Pt aware.

## 2011-01-10 ENCOUNTER — Other Ambulatory Visit: Payer: Self-pay | Admitting: Internal Medicine

## 2011-01-10 DIAGNOSIS — M1710 Unilateral primary osteoarthritis, unspecified knee: Secondary | ICD-10-CM

## 2011-01-20 ENCOUNTER — Ambulatory Visit (INDEPENDENT_AMBULATORY_CARE_PROVIDER_SITE_OTHER): Payer: Medicare Other

## 2011-01-20 DIAGNOSIS — J309 Allergic rhinitis, unspecified: Secondary | ICD-10-CM

## 2011-01-21 ENCOUNTER — Ambulatory Visit (INDEPENDENT_AMBULATORY_CARE_PROVIDER_SITE_OTHER): Payer: Medicare Other

## 2011-01-21 DIAGNOSIS — J309 Allergic rhinitis, unspecified: Secondary | ICD-10-CM

## 2011-01-25 HISTORY — PX: UVULOPALATOPHARYNGOPLASTY: SHX827

## 2011-01-31 DIAGNOSIS — L570 Actinic keratosis: Secondary | ICD-10-CM | POA: Diagnosis not present

## 2011-01-31 DIAGNOSIS — L738 Other specified follicular disorders: Secondary | ICD-10-CM | POA: Diagnosis not present

## 2011-01-31 DIAGNOSIS — L821 Other seborrheic keratosis: Secondary | ICD-10-CM | POA: Diagnosis not present

## 2011-01-31 DIAGNOSIS — L578 Other skin changes due to chronic exposure to nonionizing radiation: Secondary | ICD-10-CM | POA: Diagnosis not present

## 2011-02-02 ENCOUNTER — Ambulatory Visit: Payer: Medicare Other | Admitting: Internal Medicine

## 2011-02-03 DIAGNOSIS — M1710 Unilateral primary osteoarthritis, unspecified knee: Secondary | ICD-10-CM | POA: Diagnosis not present

## 2011-02-09 ENCOUNTER — Ambulatory Visit: Payer: Medicare Other | Admitting: Internal Medicine

## 2011-02-10 ENCOUNTER — Ambulatory Visit (INDEPENDENT_AMBULATORY_CARE_PROVIDER_SITE_OTHER): Payer: Medicare Other

## 2011-02-10 DIAGNOSIS — R9431 Abnormal electrocardiogram [ECG] [EKG]: Secondary | ICD-10-CM | POA: Diagnosis not present

## 2011-02-10 DIAGNOSIS — I4891 Unspecified atrial fibrillation: Secondary | ICD-10-CM | POA: Diagnosis not present

## 2011-02-10 DIAGNOSIS — J309 Allergic rhinitis, unspecified: Secondary | ICD-10-CM | POA: Diagnosis not present

## 2011-02-11 ENCOUNTER — Other Ambulatory Visit: Payer: Self-pay | Admitting: Otolaryngology

## 2011-02-18 ENCOUNTER — Encounter (HOSPITAL_COMMUNITY): Payer: Self-pay

## 2011-02-24 ENCOUNTER — Inpatient Hospital Stay (HOSPITAL_COMMUNITY): Admission: RE | Admit: 2011-02-24 | Payer: Medicare Other | Source: Ambulatory Visit

## 2011-02-25 DIAGNOSIS — R490 Dysphonia: Secondary | ICD-10-CM | POA: Diagnosis not present

## 2011-02-25 DIAGNOSIS — R05 Cough: Secondary | ICD-10-CM | POA: Diagnosis not present

## 2011-02-25 DIAGNOSIS — J383 Other diseases of vocal cords: Secondary | ICD-10-CM | POA: Diagnosis not present

## 2011-02-25 DIAGNOSIS — R059 Cough, unspecified: Secondary | ICD-10-CM | POA: Diagnosis not present

## 2011-02-25 DIAGNOSIS — J310 Chronic rhinitis: Secondary | ICD-10-CM | POA: Diagnosis not present

## 2011-02-28 ENCOUNTER — Ambulatory Visit (HOSPITAL_COMMUNITY)
Admission: RE | Admit: 2011-02-28 | Discharge: 2011-02-28 | Disposition: A | Discharge status: Home / Self Care | Payer: Medicare Other | Source: Ambulatory Visit | Attending: Specialist | Admitting: Specialist

## 2011-02-28 ENCOUNTER — Encounter (HOSPITAL_COMMUNITY)
Admission: RE | Admit: 2011-02-28 | Discharge: 2011-02-28 | Disposition: A | Discharge status: Home / Self Care | Payer: Medicare Other | Source: Ambulatory Visit | Attending: Specialist | Admitting: Specialist

## 2011-02-28 ENCOUNTER — Encounter (HOSPITAL_COMMUNITY): Payer: Self-pay

## 2011-02-28 ENCOUNTER — Ambulatory Visit (HOSPITAL_COMMUNITY)
Admission: RE | Admit: 2011-02-28 | Discharge: 2011-02-28 | Disposition: A | Discharge status: Home / Self Care | Payer: Medicare Other | Source: Ambulatory Visit | Attending: Otolaryngology | Admitting: Otolaryngology

## 2011-02-28 DIAGNOSIS — I10 Essential (primary) hypertension: Secondary | ICD-10-CM | POA: Diagnosis not present

## 2011-02-28 DIAGNOSIS — M5137 Other intervertebral disc degeneration, lumbosacral region: Secondary | ICD-10-CM | POA: Diagnosis not present

## 2011-02-28 DIAGNOSIS — M6289 Other specified disorders of muscle: Secondary | ICD-10-CM | POA: Insufficient documentation

## 2011-02-28 DIAGNOSIS — M47817 Spondylosis without myelopathy or radiculopathy, lumbosacral region: Secondary | ICD-10-CM | POA: Diagnosis not present

## 2011-02-28 DIAGNOSIS — Z01812 Encounter for preprocedural laboratory examination: Secondary | ICD-10-CM | POA: Diagnosis not present

## 2011-02-28 DIAGNOSIS — M4120 Other idiopathic scoliosis, site unspecified: Secondary | ICD-10-CM | POA: Diagnosis not present

## 2011-02-28 DIAGNOSIS — R042 Hemoptysis: Secondary | ICD-10-CM | POA: Diagnosis not present

## 2011-02-28 DIAGNOSIS — Z01811 Encounter for preprocedural respiratory examination: Secondary | ICD-10-CM | POA: Diagnosis not present

## 2011-02-28 HISTORY — DX: Unspecified osteoarthritis, unspecified site: M19.90

## 2011-02-28 HISTORY — DX: Malignant (primary) neoplasm, unspecified: C80.1

## 2011-02-28 LAB — CBC
HCT: 38.8 % — ABNORMAL LOW (ref 39.0–52.0)
Hemoglobin: 13.8 g/dL (ref 13.0–17.0)
MCV: 85.1 fL (ref 78.0–100.0)
Platelets: 240 10*3/uL (ref 150–400)
RBC: 4.56 MIL/uL (ref 4.22–5.81)
WBC: 4 10*3/uL (ref 4.0–10.5)

## 2011-02-28 LAB — COMPREHENSIVE METABOLIC PANEL
AST: 20 U/L (ref 0–37)
CO2: 31 mEq/L (ref 19–32)
Chloride: 98 mEq/L (ref 96–112)
Creatinine, Ser: 1.03 mg/dL (ref 0.50–1.35)
GFR calc non Af Amer: 70 mL/min — ABNORMAL LOW (ref >90)
Total Bilirubin: 0.3 mg/dL (ref 0.3–1.2)

## 2011-02-28 LAB — DIFFERENTIAL
Basophils Absolute: 0.1 10*3/uL (ref 0.0–0.1)
Basophils Relative: 2 % — ABNORMAL HIGH (ref 0–1)
Eosinophils Relative: 4 % (ref 0–5)
Monocytes Absolute: 0.5 10*3/uL (ref 0.1–1.0)
Neutro Abs: 1.7 10*3/uL (ref 1.7–7.7)

## 2011-02-28 LAB — URINALYSIS, ROUTINE W REFLEX MICROSCOPIC
Glucose, UA: NEGATIVE mg/dL
Hgb urine dipstick: NEGATIVE
Ketones, ur: NEGATIVE mg/dL
Protein, ur: NEGATIVE mg/dL

## 2011-02-28 MED ORDER — CHLORHEXIDINE GLUCONATE 4 % EX LIQD
60.0000 mL | Freq: Once | CUTANEOUS | Status: DC
  Filled 2011-02-28: qty 60

## 2011-02-28 NOTE — Patient Instructions (Addendum)
20 Corey [name]  02/28/2011   Your procedure is scheduled on: 03/02/11   Wednesday   Surgery 1120-1320 Report to [facility] at [phone] AM.  Call this number if you have problems the morning of surgery: [phone]   [facility]  [phone]   Remember:   Do not eat food:After Midnight.  Tuesday night  May have clear liquids:until Midnight .Tuesday night  Clear liquids include soda, tea, black coffee, apple or grape juice, broth.  Take these medicines the morning of surgery with A SIP OF WATER:  NORVASC, FLECANIDE, PROTONIX WITH SIP WATER      FLONASE NASAL SPRAY                  BRING INHALERS WITH YOU TO HOSPITAL  Do not wear jewelry, make-up or nail polish.  Do not wear lotions, powders, or perfumes. You may wear deodorant.  Do not shave 48 hours prior to surgery.  Do not bring valuables to the hospital.  Contacts, dentures or bridgework may not be worn into surgery.  Leave suitcase in the car. After surgery it may be brought to your room.  For patients admitted to the hospital, checkout time is 11:00 AM the day of discharge.             BRING CPAP MASK WITH YOU TO HOSPITAL  Patients discharged the day of surgery will not be allowed to drive home.  Name and phone number of your driver:[name]  Special Instructions: CHG Shower Use Special Wash: 1/2 bottle night before surgery and 1/2 bottle morning of surgery. Regular soap face and privates   Please read over the following fact sheets that you were given: MRSA Information

## 2011-02-28 NOTE — Pre-Procedure Instructions (Signed)
Last eccho 6/12 on chart

## 2011-03-01 NOTE — H&P (Signed)
Chief Complaint:  left LE pain Subjective: Patient is admitted for  lumbar decompression  Patient is a 76 y.o. male who has been seen by Dr. Shelle Iron for ongoing back and LE pain  They have been followed and found to have continued progressive pain.  They have been treated conservatively in the past including medications.  Despite conservative measures, they continue to have pain.  X-rays show that the patient has severe spinal stenosis.  It is felt that they would benefit from undergoing surgical intervention.  Risks and benefits have been discussed with the patient and they elect to proceed with surgery.  The patient has no contraindications to the upcoming procedure such as ongoing infection or progressive neurological disease.  Allergies: No Known Allergies   Medications: Tambocor (50MG  Tablet, Oral) Active. Altace (2.5MG  Capsule, Oral) Active. Norvasc (5MG  Tablet, Oral) Active. Pantoprazole Sodium (40MG  Tablet DR, Oral) Active. Plavix (75MG  Tablet, Oral) Active. Simvastatin (40MG  Tablet, Oral) Active. Toprol XL (25MG  Tablet ER 24HR, Oral) Active. Aspirin EC (81MG  Tablet DR, Oral) Active. Advair Diskus (500-50MCG/DOSE Aero Pow Br Act, Inhalation) Active. ProAir HFA (108 (90 Base)MCG/ACT Aerosol Soln, Inhalation) Active. Accolate (20MG  Tablet, Oral) Active. Citrucel ( Oral) Active. Xopenex (1.25MG /3ML Nebulized Soln, Inhalation) Active. Ambien (5MG  Tablet, Oral) Active. Oxymetazoline HCl (0.05% Solution, Nasal) Active. Fluticasone Propionate (50MCG/ACT Suspension, Nasal) Active.  Past Medical History: Past Medical History  Diagnosis Date  . Alcohol abuse, episodic 05/19/2008  . ALLERGIC RHINITIS 04/15/2009  . COLONIC POLYPS, ADENOMATOUS, HX OF 02/14/2008  . DIVERTICULOSIS, COLON 02/14/2008  . ESOPHAGEAL STRICTURE 01/28/2008  . GERD 02/14/2008  . HYPERGLYCEMIA 11/16/2006  . HYPERLIPIDEMIA 02/14/2008  . INSOMNIA-SLEEP DISORDER-UNSPEC 02/11/2009  . MYOCARDIAL INFARCTION, HX OF 11/16/2006    . 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 PROSTATE CANCER, HX OF 02/25/2000  . TRANSIENT ISCHEMIC ATTACKS, HX OF 11/16/2006  . Arthritis   . Cancer 2002  . Atrial fibrillation 11/16/2006    remote CHF related to atrial fib with rapid ventricular response over 25 yrs per office note,  . ASTHMA 11/16/2006    sinusitis-    hx ?yeast patch/white patch on vocal cord as per [facility] 02/25/11-   . OBSTRUCTIVE SLEEP APNEA 11/10/2008  . SLEEP APNEA 10/03/2009    LOV Dr Maple Hudson 12/12 in [facility]    Moderate per patient- settings ?6/last sleep study years ago  . HYPERTENSION 11/16/2006  . CORONARY ARTERY DISEASE 11/16/2006    nonischemic cardiomyapathy, last stress test 2011- LOV with clearance note Dr  Alanda Amass and EKG 1/13 on chart     Past Surgical History: Past Surgical History  Procedure Date  . Tonsilectomy, adenoidectomy, bilateral myringotomy and tubes   . Knee arthroscopy     2 surgeries right and 1 left  . Prostate surgery 2/02    prostate cancer  . Uvulopalatopharyngoplasty   . Cardiac catheterization   . Tonsillectomy   . Esophagogastroduodenoscopy     with dilitation     Family History: Family History  Problem Relation Age of Onset  . Heart attack Father   . Heart attack Brother   . Heart disease Maternal Uncle   . Heart attack Maternal Uncle     Social History: History  Substance Use Topics  . Smoking status: Former Smoker    Types: Cigarettes    Quit date: 03/22/1964  . Smokeless tobacco: Former Neurosurgeon    Quit date: 05/10/1964  . Alcohol Use: 8.4 oz/week    14 Shots of liquor per week     2 mixed  drinks daily     Review of Systems General:Not Present- Chills, Fever, Night Sweats, Appetite Loss, Fatigue, Feeling sick, Weight Gain and Weight Loss. Skin:Not Present- Itching, Rash, Skin Color Changes, Ulcer, Psoriasis and Change in Hair or Nails. HEENT:Not Present- Sensitivity to light, Hearing problems, Nose Bleed and Ringing in the Ears. Neck:Not Present- Swollen  Glands and Neck Mass. Respiratory:Not Present- Snoring, Chronic Cough, Bloody sputum and Dyspnea. Cardiovascular:Not Present- Shortness of Breath, Chest Pain, Swelling of Extremities, Leg Cramps and Palpitations. Gastrointestinal:Present- Heartburn, Abdominal Pain and Nausea. Not Present- Bloody Stool, Vomiting and Incontinence of Stool. Male Genitourinary:Present- Frequency. Not Present- Blood in Urine, Incontinence and Nocturia. Musculoskeletal:Present- Joint Stiffness, Joint Pain and Back Pain. Not Present- Muscle Weakness, Muscle Pain and Joint Swelling. Neurological:Not Present- Tingling, Numbness, Burning, Tremor, Headaches and Dizziness. Psychiatric:Not Present- Anxiety, Depression and Memory Loss. Endocrine:Not Present- Cold Intolerance, Heat Intolerance, Excessive hunger and Excessive Thirst. Hematology:Not Present- Abnormal Bleeding, Anemia, Blood Clots and Easy Bruising.   Physical Exam:   There were no vitals taken for this visit.  General Appearance:    Alert, cooperative, no distress, appears stated age  Head:    Normocephalic, without obvious abnormality, atraumatic  Eyes:    PERRL, conjunctiva/corneas clear, EOM's intact, fundi    benign, both eyes       Ears:    Normal TM's and external ear canals, both ears        Neck:   Supple, symmetrical, trachea midline, no adenopathy;       thyroid:  No enlargement/tenderness/nodules; no carotid   bruit or JVD  Back:     Symmetric, no curvature, ROM normal, no CVA tenderness  Lungs:     Clear to auscultation bilaterally, respirations unlabored  Chest wall:    No tenderness or deformity  Heart:    Regular rate and rhythm, S1 and S2 normal, no murmur, rub   or gallop  Abdomen:     Soft, non-tender, bowel sounds active all four quadrants,    no masses, no organomegaly        Extremities:   Extremities normal, atraumatic, no cyanosis or edema  Pulses:   2+ and symmetric all extremities  Skin:   Skin color, texture,  turgor normal, no rashes or lesions  Lymph nodes:   Cervical, supraclavicular, and axillary nodes normal  Neurologic:   CNII-XII intact. Normal strength, sensation and reflexes      throughout    Assessment/Plan: Severe spinal stenosis L4-5 The patient is being admitted to [facility] to undergo a  Lumbar decompression.  Surgery will be performed by Dr. Jene Every.  Risks and benefits have been discussed with the patient and they elect to proceed wth the procedure.

## 2011-03-02 ENCOUNTER — Encounter (HOSPITAL_COMMUNITY): Payer: Self-pay | Admitting: Certified Registered Nurse Anesthetist

## 2011-03-02 ENCOUNTER — Encounter (HOSPITAL_COMMUNITY): Payer: Self-pay | Admitting: *Deleted

## 2011-03-02 ENCOUNTER — Observation Stay (HOSPITAL_COMMUNITY)
Admission: RE | Admit: 2011-03-02 | Discharge: 2011-03-03 | Disposition: A | Discharge status: Home / Self Care | Payer: Medicare Other | Source: Ambulatory Visit | Attending: Specialist | Admitting: Specialist

## 2011-03-02 ENCOUNTER — Ambulatory Visit (HOSPITAL_COMMUNITY): Payer: Medicare Other

## 2011-03-02 ENCOUNTER — Encounter (HOSPITAL_COMMUNITY)
Admission: RE | Disposition: A | Discharge status: Home / Self Care | Payer: Self-pay | Source: Ambulatory Visit | Attending: Specialist

## 2011-03-02 ENCOUNTER — Ambulatory Visit (HOSPITAL_COMMUNITY): Payer: Medicare Other | Admitting: Certified Registered Nurse Anesthetist

## 2011-03-02 DIAGNOSIS — Z79899 Other long term (current) drug therapy: Secondary | ICD-10-CM | POA: Insufficient documentation

## 2011-03-02 DIAGNOSIS — E785 Hyperlipidemia, unspecified: Secondary | ICD-10-CM | POA: Diagnosis not present

## 2011-03-02 DIAGNOSIS — M47817 Spondylosis without myelopathy or radiculopathy, lumbosacral region: Secondary | ICD-10-CM | POA: Diagnosis not present

## 2011-03-02 DIAGNOSIS — M4800 Spinal stenosis, site unspecified: Secondary | ICD-10-CM

## 2011-03-02 DIAGNOSIS — IMO0002 Reserved for concepts with insufficient information to code with codable children: Secondary | ICD-10-CM | POA: Diagnosis not present

## 2011-03-02 DIAGNOSIS — M48061 Spinal stenosis, lumbar region without neurogenic claudication: Secondary | ICD-10-CM | POA: Diagnosis not present

## 2011-03-02 DIAGNOSIS — I10 Essential (primary) hypertension: Secondary | ICD-10-CM | POA: Diagnosis not present

## 2011-03-02 DIAGNOSIS — I252 Old myocardial infarction: Secondary | ICD-10-CM | POA: Insufficient documentation

## 2011-03-02 DIAGNOSIS — M5126 Other intervertebral disc displacement, lumbar region: Secondary | ICD-10-CM | POA: Diagnosis not present

## 2011-03-02 DIAGNOSIS — K219 Gastro-esophageal reflux disease without esophagitis: Secondary | ICD-10-CM | POA: Diagnosis not present

## 2011-03-02 DIAGNOSIS — M545 Low back pain, unspecified: Secondary | ICD-10-CM | POA: Insufficient documentation

## 2011-03-02 DIAGNOSIS — G4733 Obstructive sleep apnea (adult) (pediatric): Secondary | ICD-10-CM | POA: Insufficient documentation

## 2011-03-02 DIAGNOSIS — Z01818 Encounter for other preprocedural examination: Secondary | ICD-10-CM | POA: Insufficient documentation

## 2011-03-02 DIAGNOSIS — Z8546 Personal history of malignant neoplasm of prostate: Secondary | ICD-10-CM | POA: Insufficient documentation

## 2011-03-02 DIAGNOSIS — M519 Unspecified thoracic, thoracolumbar and lumbosacral intervertebral disc disorder: Secondary | ICD-10-CM | POA: Diagnosis not present

## 2011-03-02 HISTORY — PX: LUMBAR LAMINECTOMY/DECOMPRESSION MICRODISCECTOMY: SHX5026

## 2011-03-02 SURGERY — LUMBAR LAMINECTOMY/DECOMPRESSION MICRODISCECTOMY
Anesthesia: General | Site: Back | Wound class: Clean

## 2011-03-02 MED ORDER — SIMVASTATIN 40 MG PO TABS
40.0000 mg | ORAL_TABLET | Freq: Every day | ORAL | Status: DC
  Administered 2011-03-02: 40 mg via ORAL
  Filled 2011-03-02 (×2): qty 1

## 2011-03-02 MED ORDER — MIDAZOLAM HCL 5 MG/5ML IJ SOLN
INTRAMUSCULAR | Status: DC | PRN
  Administered 2011-03-02: 2 mg via INTRAVENOUS

## 2011-03-02 MED ORDER — SODIUM CHLORIDE 0.9 % IV SOLN: 250.0000 mL | INTRAVENOUS | Status: DC

## 2011-03-02 MED ORDER — DEXAMETHASONE SODIUM PHOSPHATE 10 MG/ML IJ SOLN
INTRAMUSCULAR | Status: DC | PRN
  Administered 2011-03-02: 10 mg via INTRAVENOUS

## 2011-03-02 MED ORDER — HYDROCODONE-ACETAMINOPHEN 5-325 MG PO TABS
1.0000 | ORAL_TABLET | ORAL | Status: DC | PRN
  Administered 2011-03-02: 1 via ORAL
  Administered 2011-03-02: 2 via ORAL
  Administered 2011-03-03: 1 via ORAL
  Filled 2011-03-02: qty 2
  Filled 2011-03-02 (×2): qty 1

## 2011-03-02 MED ORDER — ONDANSETRON HCL 4 MG/2ML IJ SOLN
4.0000 mg | INTRAMUSCULAR | Status: DC | PRN
  Filled 2011-03-02: qty 2

## 2011-03-02 MED ORDER — LACTATED RINGERS IV SOLN: INTRAVENOUS | Status: DC

## 2011-03-02 MED ORDER — MENTHOL 3 MG MT LOZG: 1.0000 | LOZENGE | OROMUCOSAL | Status: DC | PRN

## 2011-03-02 MED ORDER — LACTATED RINGERS IV SOLN
INTRAVENOUS | Status: DC
  Administered 2011-03-02: 11:00:00 via INTRAVENOUS

## 2011-03-02 MED ORDER — LEVALBUTEROL HCL 1.25 MG/0.5ML IN NEBU
1.2500 mg | INHALATION_SOLUTION | Freq: Three times a day (TID) | RESPIRATORY_TRACT | Status: DC | PRN
  Filled 2011-03-02: qty 0.5

## 2011-03-02 MED ORDER — SODIUM CHLORIDE 0.9 % IJ SOLN: 3.0000 mL | INTRAMUSCULAR | Status: DC | PRN

## 2011-03-02 MED ORDER — ACETAMINOPHEN 10 MG/ML IV SOLN
INTRAVENOUS | Status: DC | PRN
  Administered 2011-03-02: 1000 mg via INTRAVENOUS

## 2011-03-02 MED ORDER — ACETAMINOPHEN 650 MG RE SUPP: 650.0000 mg | RECTAL | Status: DC | PRN

## 2011-03-02 MED ORDER — LIDOCAINE HCL (CARDIAC) 20 MG/ML IV SOLN
INTRAVENOUS | Status: DC | PRN
  Administered 2011-03-02: 100 mg via INTRAVENOUS

## 2011-03-02 MED ORDER — MONTELUKAST SODIUM 10 MG PO TABS
10.0000 mg | ORAL_TABLET | Freq: Every day | ORAL | Status: DC
  Administered 2011-03-02: 10 mg via ORAL
  Filled 2011-03-02 (×2): qty 1

## 2011-03-02 MED ORDER — SODIUM CHLORIDE 0.9 % IR SOLN
Status: DC | PRN
  Administered 2011-03-02: 12:00:00

## 2011-03-02 MED ORDER — DOCUSATE SODIUM 100 MG PO CAPS
100.0000 mg | ORAL_CAPSULE | Freq: Two times a day (BID) | ORAL | Status: DC
  Administered 2011-03-02 – 2011-03-03 (×2): 100 mg via ORAL
  Filled 2011-03-02 (×3): qty 1

## 2011-03-02 MED ORDER — EPHEDRINE SULFATE 50 MG/ML IJ SOLN
INTRAMUSCULAR | Status: DC | PRN
  Administered 2011-03-02: 10 mg via INTRAVENOUS

## 2011-03-02 MED ORDER — ONDANSETRON HCL 4 MG/2ML IJ SOLN
INTRAMUSCULAR | Status: DC | PRN
  Administered 2011-03-02: 4 mg via INTRAVENOUS

## 2011-03-02 MED ORDER — RAMIPRIL 2.5 MG PO CAPS
2.5000 mg | ORAL_CAPSULE | Freq: Every day | ORAL | Status: DC
  Administered 2011-03-03: 2.5 mg via ORAL
  Filled 2011-03-02: qty 1

## 2011-03-02 MED ORDER — GLYCOPYRROLATE 0.2 MG/ML IJ SOLN
INTRAMUSCULAR | Status: DC | PRN
  Administered 2011-03-02: .6 mg via INTRAVENOUS

## 2011-03-02 MED ORDER — FLECAINIDE ACETATE 50 MG PO TABS
50.0000 mg | ORAL_TABLET | Freq: Two times a day (BID) | ORAL | Status: DC
  Administered 2011-03-02 – 2011-03-03 (×2): 50 mg via ORAL
  Filled 2011-03-02 (×4): qty 1

## 2011-03-02 MED ORDER — ACETAMINOPHEN 325 MG PO TABS: 650.0000 mg | ORAL_TABLET | ORAL | Status: DC | PRN

## 2011-03-02 MED ORDER — SUCCINYLCHOLINE CHLORIDE 20 MG/ML IJ SOLN
INTRAMUSCULAR | Status: DC | PRN
  Administered 2011-03-02: 100 mg via INTRAVENOUS

## 2011-03-02 MED ORDER — ROCURONIUM BROMIDE 100 MG/10ML IV SOLN
INTRAVENOUS | Status: DC | PRN
  Administered 2011-03-02: 20 mg via INTRAVENOUS

## 2011-03-02 MED ORDER — FENTANYL CITRATE 0.05 MG/ML IJ SOLN
INTRAMUSCULAR | Status: DC | PRN
  Administered 2011-03-02: 150 ug via INTRAVENOUS

## 2011-03-02 MED ORDER — ALBUTEROL SULFATE (5 MG/ML) 0.5% IN NEBU
2.5000 mg | INHALATION_SOLUTION | Freq: Four times a day (QID) | RESPIRATORY_TRACT | Status: DC
  Administered 2011-03-02: 2.5 mg via RESPIRATORY_TRACT
  Filled 2011-03-02: qty 0.5

## 2011-03-02 MED ORDER — PROPOFOL 10 MG/ML IV EMUL
INTRAVENOUS | Status: DC | PRN
  Administered 2011-03-02: 200 mg via INTRAVENOUS

## 2011-03-02 MED ORDER — THROMBIN 5000 UNITS EX SOLR
CUTANEOUS | Status: DC | PRN
  Administered 2011-03-02: 10000 [IU] via TOPICAL

## 2011-03-02 MED ORDER — CEFAZOLIN SODIUM-DEXTROSE 2-3 GM-% IV SOLR
2.0000 g | Freq: Three times a day (TID) | INTRAVENOUS | Status: AC
  Administered 2011-03-02 – 2011-03-03 (×2): 2 g via INTRAVENOUS
  Filled 2011-03-02 (×2): qty 50

## 2011-03-02 MED ORDER — CEFAZOLIN SODIUM-DEXTROSE 2-3 GM-% IV SOLR
2.0000 g | Freq: Once | INTRAVENOUS | Status: AC
  Administered 2011-03-02: 2 g via INTRAVENOUS

## 2011-03-02 MED ORDER — OXYCODONE-ACETAMINOPHEN 5-325 MG PO TABS: 1.0000 | ORAL_TABLET | ORAL | Status: DC | PRN

## 2011-03-02 MED ORDER — MORPHINE SULFATE 4 MG/ML IJ SOLN: 1.0000 mg | INTRAMUSCULAR | Status: DC | PRN

## 2011-03-02 MED ORDER — SODIUM CHLORIDE 0.9 % IV SOLN
INTRAVENOUS | Status: DC
  Administered 2011-03-02: 100 mL/h via INTRAVENOUS
  Administered 2011-03-03: via INTRAVENOUS

## 2011-03-02 MED ORDER — PANTOPRAZOLE SODIUM 40 MG PO TBEC
40.0000 mg | DELAYED_RELEASE_TABLET | Freq: Every day | ORAL | Status: DC
  Administered 2011-03-02 – 2011-03-03 (×2): 40 mg via ORAL
  Filled 2011-03-02 (×2): qty 1

## 2011-03-02 MED ORDER — FLUTICASONE PROPIONATE 50 MCG/ACT NA SUSP
2.0000 | Freq: Every day | NASAL | Status: DC
  Administered 2011-03-02 – 2011-03-03 (×2): 2 via NASAL
  Filled 2011-03-02: qty 16

## 2011-03-02 MED ORDER — PROMETHAZINE HCL 25 MG/ML IJ SOLN: 6.2500 mg | INTRAMUSCULAR | Status: DC | PRN

## 2011-03-02 MED ORDER — BUPIVACAINE-EPINEPHRINE 0.5% -1:200000 IJ SOLN
INTRAMUSCULAR | Status: DC | PRN
  Administered 2011-03-02: 30 mL

## 2011-03-02 MED ORDER — ALBUTEROL SULFATE HFA 108 (90 BASE) MCG/ACT IN AERS
2.0000 | INHALATION_SPRAY | Freq: Four times a day (QID) | RESPIRATORY_TRACT | Status: DC | PRN
  Filled 2011-03-02: qty 6.7

## 2011-03-02 MED ORDER — ZOLPIDEM TARTRATE 5 MG PO TABS
2.5000 mg | ORAL_TABLET | Freq: Every evening | ORAL | Status: DC
  Administered 2011-03-02: 2.5 mg via ORAL
  Filled 2011-03-02: qty 1

## 2011-03-02 MED ORDER — SODIUM CHLORIDE 0.9 % IJ SOLN: 3.0000 mL | Freq: Two times a day (BID) | INTRAMUSCULAR | Status: DC

## 2011-03-02 MED ORDER — NEOSTIGMINE METHYLSULFATE 1 MG/ML IJ SOLN
INTRAMUSCULAR | Status: DC | PRN
  Administered 2011-03-02: 4 mg via INTRAVENOUS

## 2011-03-02 MED ORDER — METOPROLOL SUCCINATE 12.5 MG HALF TABLET
12.5000 mg | ORAL_TABLET | Freq: Every day | ORAL | Status: DC
  Administered 2011-03-02: 12.5 mg via ORAL
  Filled 2011-03-02 (×2): qty 1

## 2011-03-02 MED ORDER — FENTANYL CITRATE 0.05 MG/ML IJ SOLN
25.0000 ug | INTRAMUSCULAR | Status: DC | PRN
  Administered 2011-03-02: 25 ug via INTRAVENOUS
  Administered 2011-03-02: 50 ug via INTRAVENOUS
  Administered 2011-03-02: 25 ug via INTRAVENOUS

## 2011-03-02 MED ORDER — PHENOL 1.4 % MT LIQD: 1.0000 | OROMUCOSAL | Status: DC | PRN

## 2011-03-02 MED ORDER — PHENYLEPHRINE HCL 10 MG/ML IJ SOLN
10.0000 mg | INTRAVENOUS | Status: DC | PRN
  Administered 2011-03-02: 50 ug/min via INTRAVENOUS

## 2011-03-02 MED ORDER — AMLODIPINE BESYLATE 2.5 MG PO TABS
2.5000 mg | ORAL_TABLET | Freq: Every day | ORAL | Status: DC
  Administered 2011-03-02 – 2011-03-03 (×2): 2.5 mg via ORAL
  Filled 2011-03-02 (×2): qty 1

## 2011-03-02 SURGICAL SUPPLY — 50 items
BAG ZIPLOCK 12X15 (MISCELLANEOUS) ×2 IMPLANT
BENZOIN TINCTURE PRP APPL 2/3 (GAUZE/BANDAGES/DRESSINGS) ×4 IMPLANT
CHLORAPREP W/TINT 26ML (MISCELLANEOUS) IMPLANT
CLEANER TIP ELECTROSURG 2X2 (MISCELLANEOUS) ×2 IMPLANT
CLOTH BEACON ORANGE TIMEOUT ST (SAFETY) ×2 IMPLANT
DECANTER SPIKE VIAL GLASS SM (MISCELLANEOUS) ×2 IMPLANT
DRAPE MICROSCOPE LEICA (MISCELLANEOUS) ×2 IMPLANT
DRAPE POUCH INSTRU U-SHP 10X18 (DRAPES) ×2 IMPLANT
DRAPE SURG 17X11 SM STRL (DRAPES) ×2 IMPLANT
DRSG EMULSION OIL 3X3 NADH (GAUZE/BANDAGES/DRESSINGS) ×2 IMPLANT
DRSG PAD ABDOMINAL 8X10 ST (GAUZE/BANDAGES/DRESSINGS) IMPLANT
DRSG TELFA 4X5 ISLAND ADH (GAUZE/BANDAGES/DRESSINGS) IMPLANT
DURAPREP 26ML APPLICATOR (WOUND CARE) ×2 IMPLANT
ELECT REM PT RETURN 9FT ADLT (ELECTROSURGICAL) ×2
ELECTRODE REM PT RTRN 9FT ADLT (ELECTROSURGICAL) ×1 IMPLANT
GLOVE BIOGEL PI IND STRL 6.5 (GLOVE) ×1 IMPLANT
GLOVE BIOGEL PI IND STRL 8 (GLOVE) ×1 IMPLANT
GLOVE BIOGEL PI INDICATOR 6.5 (GLOVE) ×1
GLOVE BIOGEL PI INDICATOR 8 (GLOVE) ×1
GLOVE ECLIPSE 6.5 STRL STRAW (GLOVE) ×2 IMPLANT
GLOVE INDICATOR 6.5 STRL GRN (GLOVE) ×2 IMPLANT
GLOVE SURG SS PI 8.0 STRL IVOR (GLOVE) ×4 IMPLANT
GOWN PREVENTION PLUS LG XLONG (DISPOSABLE) ×2 IMPLANT
GOWN STRL REIN XL XLG (GOWN DISPOSABLE) ×2 IMPLANT
KIT BASIN OR (CUSTOM PROCEDURE TRAY) ×2 IMPLANT
KIT POSITIONING SURG ANDREWS (MISCELLANEOUS) ×2 IMPLANT
MANIFOLD NEPTUNE II (INSTRUMENTS) ×2 IMPLANT
NEEDLE SPNL 18GX3.5 QUINCKE PK (NEEDLE) ×6 IMPLANT
PATTIES SURGICAL .5 X.5 (GAUZE/BANDAGES/DRESSINGS) IMPLANT
PATTIES SURGICAL .75X.75 (GAUZE/BANDAGES/DRESSINGS) IMPLANT
PATTIES SURGICAL 1X1 (DISPOSABLE) IMPLANT
SPONGE GAUZE 4X4 STERILE 39 (GAUZE/BANDAGES/DRESSINGS) ×2 IMPLANT
SPONGE SURGIFOAM ABS GEL 100 (HEMOSTASIS) ×2 IMPLANT
STAPLER VISISTAT (STAPLE) ×2 IMPLANT
STRIP CLOSURE SKIN 1/2X4 (GAUZE/BANDAGES/DRESSINGS) IMPLANT
SUT PROLENE 3 0 PS 2 (SUTURE) IMPLANT
SUT VIC AB 0 CT1 27 (SUTURE)
SUT VIC AB 0 CT1 27XBRD ANTBC (SUTURE) IMPLANT
SUT VIC AB 1 CT1 27 (SUTURE) ×1
SUT VIC AB 1 CT1 27XBRD ANTBC (SUTURE) ×1 IMPLANT
SUT VIC AB 1-0 CT2 27 (SUTURE) IMPLANT
SUT VIC AB 2-0 CT1 27 (SUTURE) ×1
SUT VIC AB 2-0 CT1 TAPERPNT 27 (SUTURE) ×1 IMPLANT
SUT VIC AB 2-0 CT2 27 (SUTURE) ×4 IMPLANT
SUT VICRYL 0 27 CT2 27 ABS (SUTURE) ×4 IMPLANT
SUT VICRYL 0 UR6 27IN ABS (SUTURE) IMPLANT
SYRINGE 10CC LL (SYRINGE) ×4 IMPLANT
TAPE CLOTH SURG 4X10 WHT LF (GAUZE/BANDAGES/DRESSINGS) ×2 IMPLANT
TRAY LAMINECTOMY (CUSTOM PROCEDURE TRAY) ×2 IMPLANT
YANKAUER SUCT BULB TIP NO VENT (SUCTIONS) ×2 IMPLANT

## 2011-03-02 NOTE — Interval H&P Note (Signed)
History and Physical Interval Note:  03/02/2011 10:01 AM  Corey [name]  has presented today for surgery, with the diagnosis of Stenosis Lumbar 4 - Lumbar 5  The various methods of treatment have been discussed with the patient and family. After consideration of risks, benefits and other options for treatment, the patient has consented to  Procedure(s): LUMBAR LAMINECTOMY/DECOMPRESSION MICRODISCECTOMY as a surgical intervention .  The patients' history has been reviewed, patient examined, no change in status, stable for surgery.  I have reviewed the patients' chart and labs.  Questions were answered to the patient's satisfaction.     Lindzy Rupert C

## 2011-03-02 NOTE — Transfer of Care (Signed)
Immediate Anesthesia Transfer of Care Note  Patient: Corey [name]  Procedure(s) Performed:  LUMBAR LAMINECTOMY/DECOMPRESSION MICRODISCECTOMY - Decompression Lumbar 4 - Lumbar(X-Ray)  Patient Location: PACU  Anesthesia Type: General  Level of Consciousness: sedated, patient cooperative and responds to stimulaton  Airway & Oxygen Therapy: Patient Spontanous Breathing and Patient connected to face mask oxgen  Post-op Assessment: Report given to PACU RN and Post -op Vital signs reviewed and stable  Post vital signs: Reviewed and stable  Complications: No apparent anesthesia complications

## 2011-03-02 NOTE — Brief Op Note (Signed)
03/02/2011  12:32 PM  PATIENT:  Corey [name]  76 y.o. male  PRE-OPERATIVE DIAGNOSIS:  Stenosis Lumbar 4 - Lumbar 5  POST-OPERATIVE DIAGNOSIS:  Spinal Stenosis lumbar four to lumbar five  PROCEDURE:  Procedure(s): LUMBAR LAMINECTOMY/DECOMPRESSION MICRODISCECTOMY  SURGEON:  Surgeon(s): Javier Docker, MD  PHYSICIAN ASSISTANT:   ASSISTANTS: Strader   ANESTHESIA:   general  EBL:  Total I/O In: -  Out: 50 [Blood:50]  BLOOD ADMINISTERED:none  DRAINS: none   LOCAL MEDICATIONS USED:  MARCAINE 10CC  SPECIMEN:  No Specimen  DISPOSITION OF SPECIMEN:  N/A  COUNTS:  YES  TOURNIQUET:  * No tourniquets in log *  DICTATION: .Other Dictation: Dictation Number [id number]  PLAN OF CARE: Admit for overnight observation  PATIENT DISPOSITION:  PACU - hemodynamically stable.   Delay start of Pharmacological VTE agent (>24hrs) due to surgical blood loss or risk of bleeding:  {YES/NO/NOT APPLICABLE:20182

## 2011-03-02 NOTE — Preoperative (Signed)
Beta Blockers   Reason not to administer Beta Blockers:Not Applicable 

## 2011-03-02 NOTE — Anesthesia Procedure Notes (Addendum)
Performed by: Lurlean Leyden, Kharon Hixon L.   Procedure Name: Intubation Date/Time: 03/02/2011 11:09 AM Performed by: Lurlean Leyden, Twilla Khouri L. Patient Re-evaluated:Patient Re-evaluated prior to inductionOxygen Delivery Method: Circle System Utilized Preoxygenation: Pre-oxygenation with 100% oxygen Intubation Type: IV induction Ventilation: Mask ventilation without difficulty and Oral airway inserted - appropriate to patient size Laryngoscope Size: Miller and 3 Grade View: Grade III Tube type: Oral Number of attempts: 2 Airway Equipment and Method: bougie stylet Placement Confirmation: ETT inserted through vocal cords under direct vision,  breath sounds checked- equal and bilateral and positive ETCO2 Secured at: 21 cm Tube secured with: Tape Dental Injury: Teeth and Oropharynx as per pre-operative assessment

## 2011-03-02 NOTE — Anesthesia Preprocedure Evaluation (Signed)
Anesthesia Evaluation  Patient identified by MRN, date of birth, ID band Patient awake    Reviewed: Allergy & Precautions, H&P , NPO status , Patient's Chart, lab work & pertinent test results  History of Anesthesia Complications Negative for: history of anesthetic complications  Airway Mallampati: II      Dental  (+) Caps and Dental Advisory Given   Pulmonary asthma , sleep apnea ,  clear to auscultation  Pulmonary exam normal       Cardiovascular Exercise Tolerance: Poor hypertension, + CAD, + Past MI and neg cardio ROS - DOE + dysrhythmias Atrial Fibrillation Irregular Abnormal    Neuro/Psych TIANegative Psych ROS   GI/Hepatic negative GI ROS, Neg liver ROS, GERD-  Medicated,  Endo/Other  Negative Endocrine ROS  Renal/GU negative Renal ROS   History of prostate cancer; s/p prostatectomy Genitourinary negative   Musculoskeletal negative musculoskeletal ROS (+)   Abdominal   Peds  Hematology negative hematology ROS (+)   Anesthesia Other Findings   Reproductive/Obstetrics negative OB ROS                           Anesthesia Physical Anesthesia Plan  ASA: III  Anesthesia Plan: General   Post-op Pain Management:    Induction: Intravenous  Airway Management Planned: Oral ETT  Additional Equipment:   Intra-op Plan:   Post-operative Plan: Extubation in OR  Informed Consent: I have reviewed the patients History and Physical, chart, labs and discussed the procedure including the risks, benefits and alternatives for the proposed anesthesia with the patient or authorized representative who has indicated his/her understanding and acceptance.   Dental advisory given  Plan Discussed with: CRNA  Anesthesia Plan Comments:         Anesthesia Quick Evaluation

## 2011-03-03 DIAGNOSIS — M48061 Spinal stenosis, lumbar region without neurogenic claudication: Secondary | ICD-10-CM | POA: Diagnosis present

## 2011-03-03 MED ORDER — OXYCODONE-ACETAMINOPHEN 5-325 MG PO TABS: 1.0000 | ORAL_TABLET | ORAL | Status: AC | PRN

## 2011-03-03 MED ORDER — ROSUVASTATIN CALCIUM 10 MG PO TABS
10.0000 mg | ORAL_TABLET | Freq: Every day | ORAL | Status: DC
  Filled 2011-03-03: qty 1

## 2011-03-03 NOTE — Discharge Summary (Signed)
Patient ID: PRYOR GUETTLER MRN: [id number] DOB/AGE: [birth year] 74 y.o.  Admit date: 03/02/2011 Discharge date: 03/03/2011  Admission Diagnoses:  Spinal Stenosis L4-5  Past Medical History  Diagnosis Date  . Alcohol abuse, episodic 05/19/2008  . ALLERGIC RHINITIS 04/15/2009  . COLONIC POLYPS, ADENOMATOUS, HX OF 02/14/2008  . DIVERTICULOSIS, COLON 02/14/2008  . ESOPHAGEAL STRICTURE 01/28/2008  . GERD 02/14/2008  . HYPERGLYCEMIA 11/16/2006  . HYPERLIPIDEMIA 02/14/2008  . INSOMNIA-SLEEP DISORDER-UNSPEC 02/11/2009  . MYOCARDIAL INFARCTION, HX OF 11/16/2006  . 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 PROSTATE CANCER, HX OF 02/25/2000  . TRANSIENT ISCHEMIC ATTACKS, HX OF 11/16/2006  . Arthritis   . Cancer 2002  . Atrial fibrillation 11/16/2006    remote CHF related to atrial fib with rapid ventricular response over 25 yrs per office note,  . ASTHMA 11/16/2006    sinusitis-    hx ?yeast patch/white patch on vocal cord as per [facility] 02/25/11-   . OBSTRUCTIVE SLEEP APNEA 11/10/2008  . SLEEP APNEA 10/03/2009    LOV Dr Maple Hudson 12/12 in [facility]    Moderate per patient- settings ?6/last sleep study years ago  . HYPERTENSION 11/16/2006  . CORONARY ARTERY DISEASE 11/16/2006    nonischemic cardiomyapathy, last stress test 2011- LOV with clearance note Dr  Alanda Amass and EKG 1/13 on chart   Discharge Diagnoses:  Same s/p decompression L4-5    Surgeries: Procedure(s): LUMBAR LAMINECTOMY/DECOMPRESSION MICRODISCECTOMY on 03/02/2011    Discharged Condition: Improved  Hospital Course: POWELL HALBERT is an 76 y.o. male who was admitted 03/02/2011 for operative treatment of spinal stenosis. Patient has severe unremitting pain that affects sleep, daily activities, and work/hobbies. After pre-op clearance the patient was taken to the operating room on 03/02/2011 and underwent  Procedure(s): LUMBAR LAMINECTOMY/DECOMPRESSION MICRODISCECTOMY.    Patient was given perioperative antibiotics: Anti-infectives     Start      Dose/Rate Route Frequency Ordered Stop   03/02/11 1800   ceFAZolin (ANCEF) IVPB 2 g/50 mL premix        2 g 100 mL/hr over 30 Minutes Intravenous Every 8 hours 03/02/11 1425 03/03/11 0159   03/02/11 1141   polymyxin B 500,000 Units, bacitracin 50,000 Units in sodium chloride irrigation 0.9 % 500 mL irrigation  Status:  Discontinued          As needed 03/02/11 1141 03/02/11 1240   03/02/11 0900   ceFAZolin (ANCEF) IVPB 2 g/50 mL premix        2 g 100 mL/hr over 30 Minutes Intravenous  Once 03/02/11 0851 03/02/11 1100           Patient was given sequential compression devices, early ambulation, and chemoprophylaxis to prevent DVT.  Patient benefited maximally from hospital stay and there were no complications.    Recent vital signs: Patient Vitals for the past 24 hrs:  BP Temp Temp src Pulse Resp SpO2 Height Weight  03/03/11 0635 138/74 mmHg 97.7 F (36.5 C) Oral 90  18  96 % - -  03/03/11 0305 142/71 mmHg 97.9 F (36.6 C) Oral 89  20  96 % - -  03/02/11 2255 - - - 69  20  96 % - -  03/02/11 2248 153/75 mmHg - - 80  - - - -  03/02/11 2215 153/75 mmHg 97.4 F (36.3 C) Oral 80  16  95 % - -  03/02/11 2013 - - - - - 95 % - -  03/02/11 1716 132/79 mmHg 97.4 F (36.3 C) Oral 84  18  96 % - -  03/02/11 1616 133/74 mmHg 97.4 F (36.3 C) Oral 72  20  98 % - -  03/02/11 1516 126/70 mmHg 97.6 F (36.4 C) Oral 66  20  97 % - -  03/02/11 1429 - - - - - - 6\' 1"  (1.854 m) 87.9 kg (193 lb 12.6 oz)  03/02/11 1416 128/72 mmHg 97.8 F (36.6 C) Oral 66  20  94 % - -  03/02/11 1400 125/68 mmHg 97.7 F (36.5 C) - 66  15  98 % - -  03/02/11 1345 113/89 mmHg - - 66  15  99 % - -  03/02/11 1330 131/75 mmHg - - 68  15  98 % - -  03/02/11 1315 136/72 mmHg - - 64  17  100 % - -  03/02/11 1300 131/68 mmHg - - 63  20  100 % - -  03/02/11 1245 135/80 mmHg - - 68  15  100 % - -  03/02/11 1243 122/81 mmHg 98.4 F (36.9 C) - 68  15  100 % - -  03/02/11 0849 148/87 mmHg 98.1 F (36.7 C) Oral 57  18   97 % - -     Recent laboratory studies:  Basename 02/28/11 1135  WBC 4.0  HGB 13.8  HCT 38.8*  PLT 240  NA 139  K 4.0  CL 98  CO2 31  BUN 14  CREATININE 1.03  GLUCOSE 96  INR 1.00  CALCIUM 9.5     Discharge Medications:   Medication List  As of 03/03/2011  8:31 AM   TAKE these medications         ADVAIR DISKUS 500-50 MCG/DOSE Aepb   Generic drug: Fluticasone-Salmeterol      amLODipine 5 MG tablet   Commonly known as: NORVASC   Take 2.5 mg by mouth daily.      aspirin 81 MG tablet   Take 81 mg by mouth daily.      CITRUCEL oral powder   Generic drug: methylcellulose   Take 1 packet by mouth daily.      clopidogrel 75 MG tablet   Commonly known as: PLAVIX   Take 75 mg by mouth daily.      flecainide 50 MG tablet   Commonly known as: TAMBOCOR   Take 50 mg by mouth 2 (two) times daily.      fluticasone 50 MCG/ACT nasal spray   Commonly known as: FLONASE   Place 2 sprays into the nose daily.      levalbuterol 1.25 MG/3ML nebulizer solution   Commonly known as: XOPENEX   Take 1 ampule by nebulization 3 (three) times daily as needed. For asthma        metoprolol succinate 25 MG 24 hr tablet   Commonly known as: TOPROL-XL   Take 12.5 mg by mouth at bedtime.      oxyCODONE-acetaminophen 5-325 MG per tablet   Commonly known as: PERCOCET   Take 1-2 tablets by mouth every 4 (four) hours as needed.      oxymetazoline 0.05 % nasal spray   Commonly known as: AFRIN   Place 1-2 sprays into the nose as needed. For nasal discomfort.      pantoprazole 40 MG tablet   Commonly known as: PROTONIX   Take 40 mg by mouth daily.      PROAIR HFA 108 (90 BASE) MCG/ACT inhaler   Generic drug: albuterol   Inhale 2 puffs into the lungs every 6 (  six) hours as needed. For shortness of breath.      ramipril 2.5 MG tablet   Commonly known as: ALTACE   Take 2.5 mg by mouth daily.      simvastatin 40 MG tablet   Commonly known as: ZOCOR   Take 40 mg by mouth at bedtime.        zafirlukast 20 MG tablet   Commonly known as: ACCOLATE   Take 40 mg by mouth daily before breakfast.      zolpidem 5 MG tablet   Commonly known as: AMBIEN   Take 2.5 mg by mouth Nightly.            Diagnostic Studies: Dg Chest 2 View  02/28/2011  *RADIOLOGY REPORT*  Clinical Data: Preoperative evaluation.  Hypertension.Previous history of hemoptysis.  CHEST - 2 VIEW  Comparison: 04/29/2010.  Findings: There is stable normal size shape of cardiac silhouette. Mediastinal and hilar contours appear stable. Granulomatous calcifications appear stable in the left hilar region and left lung.  No active granulomatous process is seen.  No acute infiltrative densities are evident. Bones appear average for age.  IMPRESSION: Evidence of previous granulomatous infection is stable.  No active granulomatous process is seen.  No acute or active cardiopulmonary or pleural abnormality is evident.  Original Report Authenticated By: Crawford Givens, M.D.   Dg Lumbar Spine 2-3 Views  02/28/2011  *RADIOLOGY REPORT*  Clinical Data: History of left lower back pain. History of advanced multilevel degenerative disc disease and facet degenerative spondylosis with some areas of spinal stenosis.  LUMBAR SPINE - 2-3 VIEW  Comparison: Lumbar MRI 03/30/2010.  Findings: There are five non-rib bearing lumbar-type vertebral bodies labeled L1-L5.  There is marginal osteophyte formation at multiple levels representing degenerative spondylosis. Intervertebral disc spaces appear relatively maintained except for narrowing at the level of L5-S1.  There is slight posterior slippage of the body of L5 on the body of S1.  No fracture or bony destruction is seen.  Nonaneurysmal aortic and iliac artery calcifications are present.  There is moderate scoliosis convexity to the left.  Surgical clips are seen in the pelvis.  Density projecting to the right of L2 is felt represent a dense area of fecal material rather than a calculus.  No definite  calculus can be identified in this area on lateral images.  IMPRESSION: Changes of degenerative disc disease and degenerative spondylosis. These have been described in greater detail on previous MRI. Slight posterior slippage of body of L5 on body of S1.  Nonaneurysmal aortic and iliac arteries calcifications.  Moderate scoliosis.  Original Report Authenticated By: Crawford Givens, M.D.   Dg Spine Portable 1 View  03/02/2011  *RADIOLOGY REPORT*  Clinical Data:  Degenerative disc disease.  PORTABLE SPINE - 1 VIEW  Comparison: Radiographs dated 03/02/2011 and 02/28/2011  Findings: There is an instrument at the L4-5 level.  IMPRESSION: Instrument at L4-5.  Original Report Authenticated By: Gwynn Burly, M.D.   Dg Spine Portable 1 View  03/02/2011  *RADIOLOGY REPORT*  Clinical Data: Intraoperative localization  PORTABLE SPINE - 1 VIEW  Comparison: Portable exam 1126 hours compared to 02/28/2011 Correlation:  MRI lumbar spine 03/30/2010  Findings: Five non-rib bearing lumbar vertebrae by prior exams. Metallic probes from posterior approach are identified, located at the spinous process at mid L3 and at the level of the L5-S1 disc space. Scattered disc space narrowing. Facet degenerative changes lower lumbar spine. Bulky anterior spurs L3-L4. Scattered atherosclerotic calcifications.  IMPRESSION: Posterior localization of mid  L3 and L5-S1 disc space levels as above.  Original Report Authenticated By: Lollie Marrow, M.D.    Disposition: Home or Self Care  Discharge Orders    Future Appointments: Provider: Department: Dept Phone: Center:   05/02/2011 11:00 AM Waymon Budge, MD [facility] [phone] None     Future Orders Please Complete By Expires   Diet - low sodium heart healthy      Call MD / Call 911      Comments:   If you experience chest pain or shortness of breath, CALL 911 and be transported to the hospital emergency room.  If you develope a fever above 101 F, pus (white drainage) or  increased drainage or redness at the wound, or calf pain, call your surgeon's office.   Constipation Prevention      Comments:   Drink plenty of fluids.  Prune juice may be helpful.  You may use a stool softener, such as Colace (over the counter) 100 mg twice a day.  Use MiraLax (over the counter) for constipation as needed.   Increase activity slowly as tolerated      Discharge instructions      Comments:   Walk As Tolerated utilizing back precautions.  No bending, twisting, or lifting.  No driving for 2 weeks.  Ok to shower in 72 hours.  Dressing change daily.  No lifting. Resume Plavix and Aspirin Saturday.      Follow-up Information    Follow up with BEANE,JEFFREY C, MD in 2 weeks.   Contact information:   [facility] [address] [place] [address] [phone]           Signed: Liam Graham. 03/03/2011, 8:31 AM

## 2011-03-03 NOTE — Evaluation (Signed)
Physical Therapy Evaluation Patient Details Name: Corey [name] MRN: [id number] DOB: 1935-11-28 Today's Date: 03/03/2011  Problem List:  Patient Active Problem List  Diagnoses  . OTHER TESTICULAR HYPOFUNCTION  . HYPERLIPIDEMIA  . ALCOHOL ABUSE, EPISODIC  . OBSTRUCTIVE SLEEP APNEA  . HYPERTENSION  . MYOCARDIAL INFARCTION, HX OF  . CORONARY ARTERY DISEASE  . ATRIAL FIBRILLATION  . ALLERGIC RHINITIS  . ASTHMA  . ESOPHAGEAL STRICTURE  . GERD  . DIVERTICULOSIS, COLON  . INSOMNIA-SLEEP DISORDER-UNSPEC  . HYPERGLYCEMIA  . PROSTATE CANCER, HX OF  . TRANSIENT ISCHEMIC ATTACKS, HX OF  . COLONIC POLYPS, ADENOMATOUS, HX OF  . Polyarthralgia  . Lumbar spinal stenosis    Past Medical History:  Past Medical History  Diagnosis Date  . Alcohol abuse, episodic 05/19/2008  . ALLERGIC RHINITIS 04/15/2009  . COLONIC POLYPS, ADENOMATOUS, HX OF 02/14/2008  . DIVERTICULOSIS, COLON 02/14/2008  . ESOPHAGEAL STRICTURE 01/28/2008  . GERD 02/14/2008  . HYPERGLYCEMIA 11/16/2006  . HYPERLIPIDEMIA 02/14/2008  . INSOMNIA-SLEEP DISORDER-UNSPEC 02/11/2009  . MYOCARDIAL INFARCTION, HX OF 11/16/2006  . 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 PROSTATE CANCER, HX OF 02/25/2000  . TRANSIENT ISCHEMIC ATTACKS, HX OF 11/16/2006  . Arthritis   . Cancer 2002  . Atrial fibrillation 11/16/2006    remote CHF related to atrial fib with rapid ventricular response over 25 yrs per office note,  . ASTHMA 11/16/2006    sinusitis-    hx ?yeast patch/white patch on vocal cord as per [facility] 02/25/11-   . OBSTRUCTIVE SLEEP APNEA 11/10/2008  . SLEEP APNEA 10/03/2009    LOV Dr Maple Hudson 12/12 in EPIC    Moderate per patient- settings ?6/last sleep study years ago  . HYPERTENSION 11/16/2006  . CORONARY ARTERY DISEASE 11/16/2006    nonischemic cardiomyapathy, last stress test 2011- LOV with clearance note Dr  Alanda Amass and EKG 1/13 on chart   Past Surgical History:  Past Surgical History  Procedure Date  . Tonsilectomy,  adenoidectomy, bilateral myringotomy and tubes   . Knee arthroscopy     2 surgeries right and 1 left  . Prostate surgery 2/02    prostate cancer  . Uvulopalatopharyngoplasty   . Cardiac catheterization   . Tonsillectomy   . Esophagogastroduodenoscopy     with dilitation    PT Assessment/Plan/Recommendation PT Assessment Clinical Impression Statement: pt doing well, feels ready to D/C PT Recommendation/Assessment: All further PT needs can be met in the next venue of care PT Recommendation Follow Up Recommendations: Outpatient PT (upon MD rec's after f/u office visit) Equipment Recommended: None recommended by PT PT Goals     PT Evaluation Precautions/Restrictions  Precautions Precautions: Back Precaution Booklet Issued: Yes (comment) (handout issue) Required Braces or Orthoses: No Restrictions Weight Bearing Restrictions: No Prior Functioning  Home Living Lives With: Spouse Receives Help From: Family Type of Home: House Home Layout: Two level Alternate Level Stairs-Rails: Right;Left;Can reach both Alternate Level Stairs-Number of Steps: 12 Home Access: Stairs to enter [employer] of Steps: 3 Bathroom Shower/Tub: Health visitor: Handicapped height Home Adaptive Equipment: None Prior Function Level of Independence: Independent with basic ADLs;Independent with gait Cognition Cognition Arousal/Alertness: Awake/alert Overall Cognitive Status: Appears within functional limits for tasks assessed Orientation Level: Oriented X4 Cognition - Other Comments: occasional cues for back precautions; handout given Sensation/Coordination   Extremity Assessment RUE Assessment RUE Assessment: Within Functional Limits LUE Assessment LUE Assessment: Within Functional Limits RLE Assessment RLE Assessment: Within Functional Limits LLE Assessment LLE Assessment: Within Functional Limits Mobility (including Balance)  Bed Mobility Bed Mobility:  Yes Rolling Right: 5: Supervision Rolling Right Details (indicate cue type and reason): cues log roll Rolling Left: 5: Supervision Rolling Left Details (indicate cue type and reason): cues to log roll Sit to Sidelying Left: 5: Supervision Sit to Sidelying Left Details (indicate cue type and reason): cues to log roll Transfers Sit to Stand: 7: Independent Ambulation/Gait Ambulation/Gait: Yes Ambulation/Gait Assistance: 5: Supervision Ambulation/Gait Assistance Details (indicate cue type and reason): for safety Ambulation Distance (Feet): 300 Feet Assistive device: None Gait Pattern: Step-through pattern Stairs: Yes Stairs Assistance: 5: Supervision Stairs Assistance Details (indicate cue type and reason): cues for safety Stair Management Technique: One rail Right Number of Stairs: 10     Exercise    End of Session PT - End of Session Equipment Utilized During Treatment: Gait belt Activity Tolerance: Patient tolerated treatment well Patient left: in bed General Behavior During Session: [facility] for tasks performed Cognition: [facility] for tasks performed  [facility] 03/03/2011, 9:16 AM

## 2011-03-03 NOTE — Progress Notes (Signed)
[facility] discharged home, dressing change to lower back incision wife instructed on dressing change, prescriptions given to patient, patient to follow up with MD for follow up appointment, pt educated on back precautions Means, Corey [name] N 03-02-10 11:05am

## 2011-03-03 NOTE — Anesthesia Postprocedure Evaluation (Signed)
Anesthesia Post Note  Patient: Corey [name]  Procedure(s) Performed:  LUMBAR LAMINECTOMY/DECOMPRESSION MICRODISCECTOMY - Decompression Lumbar 4 - Lumbar(X-Ray)  Anesthesia type: General  Patient location: PACU  Post pain: Pain level controlled  Post assessment: Post-op Vital signs reviewed  Last Vitals:  Filed Vitals:   03/03/11 0635  BP: 138/74  Pulse: 90  Temp: 36.5 C  Resp: 18    Post vital signs: Reviewed  Level of consciousness: sedated  Complications: No apparent anesthesia complications

## 2011-03-03 NOTE — Op Note (Signed)
NAME:  Corey [name], Corey [name] NO.:  [id number]  MEDICAL RECORD NO.:  [id number]  LOCATION:  1531                         FACILITY:  [facility]  PHYSICIAN:  Jene Every, M.D.    DATE OF BIRTH:  [birth year]  DATE OF PROCEDURE:  03/02/2011 DATE OF DISCHARGE:                              OPERATIVE REPORT   PREOPERATIVE DIAGNOSIS:  Spinal stenosis, L4-L5.  POSTOPERATIVE DIAGNOSIS:  Spinal stenosis, L4-L5.  PROCEDURE PERFORMED:  Lumbar decompression centrally at L4-5 with bilateral hemilaminotomies and foraminotomies of L4 and L5.  ANESTHESIA:  General.  ASSISTANT:  Roma Schanz, P.A.  BRIEF HISTORY:  This is a 76 year old with left lower extremity radicular pain, L5 nerve root distribution.  MRI indicating lateral recess stenosis, fairly severe at L4-5, right and left and apparently neural tension signs, EHL weakness, osteoarthritis of the left knee.  He was indicated for lumbar decompression at L4-5, failing conservative treatment.  Risk and benefits were discussed including bleeding, infection, damage to vascular structures, no change in symptoms, worsening symptoms, need for repeat decompression, DVT, PE, anesthetic complications etc.  TECHNIQUE:  With the patient in supine position, after induction of adequate general anesthesia, 2 g Kefzol, the lumbar region was prepped and draped in the usual sterile fashion.  After he was placed on the Lane frame, all bony prominences were well padded.  Two #18-gauge spinal needles were utilized to localize L4-5 interspace, confirmed with x-ray.  An incision was then made from the spinous process of L4-5. Subcutaneous tissue was dissected.  Electrocautery was utilized to achieve hemostasis.  Dorsolumbar fascia was identified, divided in line with the skin incision.  Paraspinous muscle was elevated from the lamina of L4 and 5.  McCullough retractor was placed.  Confirmatory radiograph was unavailable due to x-ray  malfunction.  We proceeded after palpation of L5-S1 interspace.  I removed the spinous process of L4, partially L5. Operating microscope was draped, brought on the surgical field.  Central decompression and hemilaminotomies were performed at L4 on the surface utilizing 2 mm Kerrison, placed a neuro patty beneath the ligamentum flavum, which was hypertrophied and combined with facet hypertrophy compressed the 5 roots bilaterally severely left greater than right. Centrally the lateral recesses were decompressed to the medial border of the pedicle, the facets were undercut with a 2 mm Kerrison.  Bipolar cautery was utilized to achieve hemostasis.  Foraminotomies of L5 were performed.  Foraminotomies of L4 were performed bilaterally.  There was stenosis on the left greater than the right, multifactorial.  Following the decompression, a neuro-probe was placed cephalad to above the pedicle 4, below the pedicle 5, widely patent, 1 cm excursion of the 5 root medial to the pedicle without difficulty.  There was a bulging disk that was noted.  It was not herniated.  A hockey-stick probe was passed down the foramen of L5 and it was patent.  The thecal sac was restored. A confirmatory radiograph was obtained with a neuro instrument in the space.  No evidence of CSF leakage or active bleeding.  Copious irrigation of antibiotic irrigation was utilized.  Thrombin-soaked Gelfoam was placed in the laminotomy defect.  McCullough retractor was removed.  Paraspinous muscles were inspected, no  evidence of active bleeding, copiously irrigated.  The dorsolumbar fascia was reapproximated with #1 Vicryl figure-of-8 sutures.  A small aperture at the distal part of the wound was opened, approximately 1/4 inch.  Subcu with 2-0 Vicryl simple sutures.  Skin was reapproximated with staples. The wound was dressed sterilely.  The patient was placed supine on the hospital bed, extubated without difficulty and transported  to the recovery room in satisfactory condition.  The patient tolerated the procedure well.  There were no complications. Assistant was [employer], helped with gentle and intermittent neural traction.  BLOOD LOSS:  Approximately 25 mL.     Jene Every, M.D.     Cordelia Pen  D:  03/02/2011  T:  03/03/2011  Job:  [id number]

## 2011-03-03 NOTE — Evaluation (Signed)
Occupational Therapy Evaluation Patient Details Name: Corey [name] MRN: [id number] DOB: 05-Jul-1935 Today's Date: 03/03/2011  Problem List:  Patient Active Problem List  Diagnoses  . OTHER TESTICULAR HYPOFUNCTION  . HYPERLIPIDEMIA  . ALCOHOL ABUSE, EPISODIC  . OBSTRUCTIVE SLEEP APNEA  . HYPERTENSION  . MYOCARDIAL INFARCTION, HX OF  . CORONARY ARTERY DISEASE  . ATRIAL FIBRILLATION  . ALLERGIC RHINITIS  . ASTHMA  . ESOPHAGEAL STRICTURE  . GERD  . DIVERTICULOSIS, COLON  . INSOMNIA-SLEEP DISORDER-UNSPEC  . HYPERGLYCEMIA  . PROSTATE CANCER, HX OF  . TRANSIENT ISCHEMIC ATTACKS, HX OF  . COLONIC POLYPS, ADENOMATOUS, HX OF  . Polyarthralgia  . Lumbar spinal stenosis    Past Medical History:  Past Medical History  Diagnosis Date  . Alcohol abuse, episodic 05/19/2008  . ALLERGIC RHINITIS 04/15/2009  . COLONIC POLYPS, ADENOMATOUS, HX OF 02/14/2008  . DIVERTICULOSIS, COLON 02/14/2008  . ESOPHAGEAL STRICTURE 01/28/2008  . GERD 02/14/2008  . HYPERGLYCEMIA 11/16/2006  . HYPERLIPIDEMIA 02/14/2008  . INSOMNIA-SLEEP DISORDER-UNSPEC 02/11/2009  . MYOCARDIAL INFARCTION, HX OF 11/16/2006  . 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 PROSTATE CANCER, HX OF 02/25/2000  . TRANSIENT ISCHEMIC ATTACKS, HX OF 11/16/2006  . Arthritis   . Cancer 2002  . Atrial fibrillation 11/16/2006    remote CHF related to atrial fib with rapid ventricular response over 25 yrs per office note,  . ASTHMA 11/16/2006    sinusitis-    hx ?yeast patch/white patch on vocal cord as per [facility] 02/25/11-   . OBSTRUCTIVE SLEEP APNEA 11/10/2008  . SLEEP APNEA 10/03/2009    LOV Dr Maple Hudson 12/12 in EPIC    Moderate per patient- settings ?6/last sleep study years ago  . HYPERTENSION 11/16/2006  . CORONARY ARTERY DISEASE 11/16/2006    nonischemic cardiomyapathy, last stress test 2011- LOV with clearance note Dr  Alanda Amass and EKG 1/13 on chart   Past Surgical History:  Past Surgical History  Procedure Date  . Tonsilectomy,  adenoidectomy, bilateral myringotomy and tubes   . Knee arthroscopy     2 surgeries right and 1 left  . Prostate surgery 2/02    prostate cancer  . Uvulopalatopharyngoplasty   . Cardiac catheterization   . Tonsillectomy   . Esophagogastroduodenoscopy     with dilitation    OT Assessment/Plan/Recommendation OT Assessment Clinical Impression Statement: 76 year old male was admitted for lumbar lami.  All education was completed and pt verbalizes understanding.  Initially needed a few cues for back precautions but followed through after instruction.  No further OT needs OT Recommendation/Assessment: Patient does not need any further OT services OT Recommendation Follow Up Recommendations: No OT follow up Equipment Recommended: None recommended by OT OT Goals    OT Evaluation Precautions/Restrictions  Precautions Precautions: Back Precaution Booklet Issued: Yes (comment) (handout issue) Required Braces or Orthoses: No Restrictions Weight Bearing Restrictions: No Prior Functioning Home Living Lives With: Spouse Receives Help From: Family Type of Home: House Home Layout: Two level Alternate Level Stairs-Rails: Right;Left;Can reach both Alternate Level Stairs-Number of Steps: 12 Home Access: Stairs to enter [employer] of Steps: 3 Bathroom Shower/Tub: Health visitor: Handicapped height Home Adaptive Equipment: None Prior Function Level of Independence: Independent with basic ADLs;Independent with gait ADL ADL Grooming: Simulated;Supervision/safety Where Assessed - Grooming: Standing at sink Upper Body Bathing: Simulated;Supervision/safety Where Assessed - Upper Body Bathing: Sitting, bed;Unsupported Lower Body Bathing: Simulated;Set up;Supervision/safety Where Assessed - Lower Body Bathing: Sit to stand from bed (able to cross legs without pulling) Upper Body  Dressing: Simulated;Other (comment);Minimal assistance (iv) Where Assessed - Upper  Body Dressing: Unsupported;Sitting, bed Lower Body Dressing: Simulated;Supervision/safety;Set up Where Assessed - Lower Body Dressing: Sit to stand from bed Toilet Transfer: Simulated;Supervision/safety;Other (comment) (stood at toilet; sat on bed; 20" commodes) Toilet Transfer Method: Ambulating Toileting - Clothing Manipulation: Simulated;Supervision/safety Where Assessed - Glass blower/designer Manipulation: Standing Toileting - Hygiene: Simulated;Supervision/safety Where Assessed - Toileting Hygiene: Standing Tub/Shower Transfer: Other (comment) (pt verbalizes doesn't feel he needs to practice) Ambulation Related to ADLs: supervision level walking to bathroom ADL Comments: supervision as pt needs to be reminded of back precautions occasionally (twist to turn bathroom light on) Vision/Perception  Vision - History Patient Visual Report: No change from baseline Cognition Cognition Arousal/Alertness: Awake/alert Overall Cognitive Status: Appears within functional limits for tasks assessed Orientation Level: Oriented X4 Cognition - Other Comments: occasional cues for back precautions; handout given Sensation/Coordination   Extremity Assessment RUE Assessment RUE Assessment: Within Functional Limits for back precautions, bilaterally LUE Assessment LUE Assessment: Within Functional Limits Mobility  Bed Mobility Bed Mobility: Yes Rolling Right: 5: Supervision Rolling Left: 5: Supervision Left Sidelying to Sit: 5: Supervision;With rails;Other (comment) (initially tried to sit up without rolling) Transfers Transfers: Yes Sit to Stand: 7: Independent Exercises   End of Session OT - End of Session Activity Tolerance: Patient tolerated treatment well Patient left: in bed;with call bell in reach Corey [name], OTR/L [phone] 03/03/2011  Corey [name] 03/03/2011, 9:03 AM

## 2011-03-03 NOTE — Progress Notes (Signed)
Subjective: 1 Day Post-Op Procedure(s) (LRB): LUMBAR LAMINECTOMY/DECOMPRESSION MICRODISCECTOMY (N/A) Patient reports pain as 3 on 0-10 scale.    Patient has complaints of inability to get rest.  He had multiple issues overnight which prevented him from sleeping so he feels a bit agitated this am and is anxious to go home.  He has walked in the halls without trouble and does note improvement in his symptoms.  He is voiding without difficulty.    Objective: Vital signs in last 24 hours: Temp:  [97.4 F (36.3 C)-98.4 F (36.9 C)] 97.7 F (36.5 C) (02/07 0635) Pulse Rate:  [57-90] 90  (02/07 0635) Resp:  [15-20] 18  (02/07 0635) BP: (113-153)/(68-89) 138/74 mmHg (02/07 0635) SpO2:  [94 %-100 %] 96 % (02/07 0635) Weight:  [87.9 kg (193 lb 12.6 oz)] 87.9 kg (193 lb 12.6 oz) (02/06 1429)  Intake/Output from previous day:  Intake/Output Summary (Last 24 hours) at 03/03/11 0825 Last data filed at 03/03/11 4098  Gross per 24 hour  Intake 2163.33 ml  Output   2800 ml  Net -636.67 ml    Intake/Output this shift:    Labs: Results for orders placed during the hospital encounter of 02/28/11  CBC      Component Value Range   WBC 4.0  4.0 - 10.5 (K/uL)   RBC 4.56  4.22 - 5.81 (MIL/uL)   Hemoglobin 13.8  13.0 - 17.0 (g/dL)   HCT 11.9 (*) 14.7 - 52.0 (%)   MCV 85.1  78.0 - 100.0 (fL)   MCH 30.3  26.0 - 34.0 (pg)   MCHC 35.6  30.0 - 36.0 (g/dL)   RDW 82.9  56.2 - 13.0 (%)   Platelets 240  150 - 400 (K/uL)  COMPREHENSIVE METABOLIC PANEL      Component Value Range   Sodium 139  135 - 145 (mEq/L)   Potassium 4.0  3.5 - 5.1 (mEq/L)   Chloride 98  96 - 112 (mEq/L)   CO2 31  19 - 32 (mEq/L)   Glucose, Bld 96  70 - 99 (mg/dL)   BUN 14  6 - 23 (mg/dL)   Creatinine, Ser 8.65  0.50 - 1.35 (mg/dL)   Calcium 9.5  8.4 - 78.4 (mg/dL)   Total Protein 7.3  6.0 - 8.3 (g/dL)   Albumin 3.9  3.5 - 5.2 (g/dL)   AST 20  0 - 37 (U/L)   ALT 14  0 - 53 (U/L)   Alkaline Phosphatase 74  39 - 117 (U/L)   Total Bilirubin 0.3  0.3 - 1.2 (mg/dL)   GFR calc non Af Amer 70 (*) >90 (mL/min)   GFR calc Af Amer 81 (*) >90 (mL/min)  DIFFERENTIAL      Component Value Range   Neutrophils Relative 42 (*) 43 - 77 (%)   Neutro Abs 1.7  1.7 - 7.7 (K/uL)   Lymphocytes Relative 41  12 - 46 (%)   Lymphs Abs 1.7  0.7 - 4.0 (K/uL)   Monocytes Relative 12  3 - 12 (%)   Monocytes Absolute 0.5  0.1 - 1.0 (K/uL)   Eosinophils Relative 4  0 - 5 (%)   Eosinophils Absolute 0.1  0.0 - 0.7 (K/uL)   Basophils Relative 2 (*) 0 - 1 (%)   Basophils Absolute 0.1  0.0 - 0.1 (K/uL)  PROTIME-INR      Component Value Range   Prothrombin Time 13.4  11.6 - 15.2 (seconds)   INR 1.00  0.00 - 1.49  URINALYSIS, ROUTINE W REFLEX MICROSCOPIC      Component Value Range   Color, Urine YELLOW  YELLOW    APPearance CLEAR  CLEAR    Specific Gravity, Urine 1.012  1.005 - 1.030    pH 5.5  5.0 - 8.0    Glucose, UA NEGATIVE  NEGATIVE (mg/dL)   Hgb urine dipstick NEGATIVE  NEGATIVE    Bilirubin Urine NEGATIVE  NEGATIVE    Ketones, ur NEGATIVE  NEGATIVE (mg/dL)   Protein, ur NEGATIVE  NEGATIVE (mg/dL)   Urobilinogen, UA 0.2  0.0 - 1.0 (mg/dL)   Nitrite NEGATIVE  NEGATIVE    Leukocytes, UA NEGATIVE  NEGATIVE   SURGICAL PCR SCREEN      Component Value Range   MRSA, PCR NEGATIVE  NEGATIVE    Staphylococcus aureus NEGATIVE  NEGATIVE   APTT      Component Value Range   aPTT 29  24 - 37 (seconds)    Exam: Neurologically intact Neurovascular intact Sensation intact distally Dorsiflexion/Plantar flexion intact Incision: dressing C/D/I Compartment soft  Motor function intact - moving foot and toes well on exam.   Assessment/Plan: 1 Day Post-Op Procedure(s) (LRB): LUMBAR LAMINECTOMY/DECOMPRESSION MICRODISCECTOMY (N/A) Up with therapy D/c home after PT Procedure(s) (LRB): LUMBAR LAMINECTOMY/DECOMPRESSION MICRODISCECTOMY (N/A) Past Medical History  Diagnosis Date  . Alcohol abuse, episodic 05/19/2008  . ALLERGIC RHINITIS  04/15/2009  . COLONIC POLYPS, ADENOMATOUS, HX OF 02/14/2008  . DIVERTICULOSIS, COLON 02/14/2008  . ESOPHAGEAL STRICTURE 01/28/2008  . GERD 02/14/2008  . HYPERGLYCEMIA 11/16/2006  . HYPERLIPIDEMIA 02/14/2008  . INSOMNIA-SLEEP DISORDER-UNSPEC 02/11/2009  . MYOCARDIAL INFARCTION, HX OF 11/16/2006  . 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 PROSTATE CANCER, HX OF 02/25/2000  . TRANSIENT ISCHEMIC ATTACKS, HX OF 11/16/2006  . Arthritis   . Cancer 2002  . Atrial fibrillation 11/16/2006    remote CHF related to atrial fib with rapid ventricular response over 25 yrs per office note,  . ASTHMA 11/16/2006    sinusitis-    hx ?yeast patch/white patch on vocal cord as per [facility] 02/25/11-   . OBSTRUCTIVE SLEEP APNEA 11/10/2008  . SLEEP APNEA 10/03/2009    LOV Dr Maple Hudson 12/12 in EPIC    Moderate per patient- settings ?6/last sleep study years ago  . HYPERTENSION 11/16/2006  . CORONARY ARTERY DISEASE 11/16/2006    nonischemic cardiomyapathy, last stress test 2011- LOV with clearance note Dr  Alanda Amass and EKG 1/13 on chart   Patient to resume Plavix and ASA on Saturday   {Abdulahad Mederos R. 03/03/2011, 8:25 AM

## 2011-03-04 MED FILL — White Petrolatum Gel: Qty: 5 | Status: AC

## 2011-03-07 ENCOUNTER — Encounter (HOSPITAL_COMMUNITY): Payer: Self-pay | Admitting: Emergency Medicine

## 2011-03-07 ENCOUNTER — Emergency Department (HOSPITAL_COMMUNITY): Payer: Medicare Other

## 2011-03-07 ENCOUNTER — Other Ambulatory Visit: Payer: Self-pay

## 2011-03-07 ENCOUNTER — Emergency Department (HOSPITAL_COMMUNITY)
Admission: EM | Admit: 2011-03-07 | Discharge: 2011-03-07 | Disposition: A | Discharge status: Home / Self Care | Payer: Medicare Other | Attending: Emergency Medicine | Admitting: Emergency Medicine

## 2011-03-07 DIAGNOSIS — Z860100 Personal history of colon polyps, unspecified: Secondary | ICD-10-CM | POA: Insufficient documentation

## 2011-03-07 DIAGNOSIS — J45909 Unspecified asthma, uncomplicated: Secondary | ICD-10-CM | POA: Insufficient documentation

## 2011-03-07 DIAGNOSIS — E876 Hypokalemia: Secondary | ICD-10-CM | POA: Diagnosis not present

## 2011-03-07 DIAGNOSIS — J8410 Pulmonary fibrosis, unspecified: Secondary | ICD-10-CM | POA: Diagnosis not present

## 2011-03-07 DIAGNOSIS — E785 Hyperlipidemia, unspecified: Secondary | ICD-10-CM | POA: Insufficient documentation

## 2011-03-07 DIAGNOSIS — M129 Arthropathy, unspecified: Secondary | ICD-10-CM | POA: Insufficient documentation

## 2011-03-07 DIAGNOSIS — K5730 Diverticulosis of large intestine without perforation or abscess without bleeding: Secondary | ICD-10-CM | POA: Insufficient documentation

## 2011-03-07 DIAGNOSIS — F410 Panic disorder [episodic paroxysmal anxiety] without agoraphobia: Secondary | ICD-10-CM | POA: Insufficient documentation

## 2011-03-07 DIAGNOSIS — R002 Palpitations: Secondary | ICD-10-CM | POA: Diagnosis not present

## 2011-03-07 DIAGNOSIS — R0602 Shortness of breath: Secondary | ICD-10-CM | POA: Diagnosis not present

## 2011-03-07 DIAGNOSIS — I2510 Atherosclerotic heart disease of native coronary artery without angina pectoris: Secondary | ICD-10-CM | POA: Insufficient documentation

## 2011-03-07 DIAGNOSIS — K219 Gastro-esophageal reflux disease without esophagitis: Secondary | ICD-10-CM | POA: Insufficient documentation

## 2011-03-07 DIAGNOSIS — I10 Essential (primary) hypertension: Secondary | ICD-10-CM | POA: Insufficient documentation

## 2011-03-07 DIAGNOSIS — Z8546 Personal history of malignant neoplasm of prostate: Secondary | ICD-10-CM | POA: Insufficient documentation

## 2011-03-07 DIAGNOSIS — Z8673 Personal history of transient ischemic attack (TIA), and cerebral infarction without residual deficits: Secondary | ICD-10-CM | POA: Insufficient documentation

## 2011-03-07 DIAGNOSIS — I4891 Unspecified atrial fibrillation: Secondary | ICD-10-CM | POA: Insufficient documentation

## 2011-03-07 DIAGNOSIS — I252 Old myocardial infarction: Secondary | ICD-10-CM | POA: Insufficient documentation

## 2011-03-07 DIAGNOSIS — G4733 Obstructive sleep apnea (adult) (pediatric): Secondary | ICD-10-CM | POA: Insufficient documentation

## 2011-03-07 DIAGNOSIS — G4700 Insomnia, unspecified: Secondary | ICD-10-CM | POA: Insufficient documentation

## 2011-03-07 DIAGNOSIS — F1010 Alcohol abuse, uncomplicated: Secondary | ICD-10-CM | POA: Insufficient documentation

## 2011-03-07 LAB — MAGNESIUM: Magnesium: 2.3 mg/dL (ref 1.5–2.5)

## 2011-03-07 LAB — POCT I-STAT, CHEM 8
BUN: 13 mg/dL (ref 6–23)
Calcium, Ion: 1.21 mmol/L (ref 1.12–1.32)
Creatinine, Ser: 0.8 mg/dL (ref 0.50–1.35)
Hemoglobin: 15.3 g/dL (ref 13.0–17.0)
Sodium: 138 mEq/L (ref 135–145)
TCO2: 31 mmol/L (ref 0–100)

## 2011-03-07 LAB — PROTIME-INR
INR: 1 (ref 0.00–1.49)
Prothrombin Time: 13.4 seconds (ref 11.6–15.2)

## 2011-03-07 LAB — DIFFERENTIAL
Basophils Absolute: 0 10*3/uL (ref 0.0–0.1)
Basophils Relative: 0 % (ref 0–1)
Eosinophils Absolute: 0.1 10*3/uL (ref 0.0–0.7)
Eosinophils Relative: 2 % (ref 0–5)
Lymphocytes Relative: 28 % (ref 12–46)
Lymphs Abs: 1.4 10*3/uL (ref 0.7–4.0)
Monocytes Absolute: 0.5 10*3/uL (ref 0.1–1.0)
Monocytes Relative: 10 % (ref 3–12)
Neutro Abs: 3.1 10*3/uL (ref 1.7–7.7)
Neutrophils Relative %: 60 % (ref 43–77)

## 2011-03-07 LAB — CBC
HCT: 41.1 % (ref 39.0–52.0)
Hemoglobin: 14.5 g/dL (ref 13.0–17.0)
MCH: 29.6 pg (ref 26.0–34.0)
MCHC: 35.3 g/dL (ref 30.0–36.0)
MCV: 83.9 fL (ref 78.0–100.0)
Platelets: 261 10*3/uL (ref 150–400)
RBC: 4.9 MIL/uL (ref 4.22–5.81)
RDW: 12.6 % (ref 11.5–15.5)
WBC: 5.2 10*3/uL (ref 4.0–10.5)

## 2011-03-07 LAB — APTT: aPTT: 30 seconds (ref 24–37)

## 2011-03-07 LAB — CALCIUM: Calcium: 10.5 mg/dL (ref 8.4–10.5)

## 2011-03-07 LAB — POCT I-STAT TROPONIN I

## 2011-03-07 MED ORDER — IOHEXOL 300 MG/ML  SOLN
100.0000 mL | Freq: Once | INTRAMUSCULAR | Status: AC | PRN
  Administered 2011-03-07: 70 mL via INTRAVENOUS

## 2011-03-07 MED ORDER — SODIUM CHLORIDE 0.9 % IV SOLN
INTRAVENOUS | Status: DC
  Administered 2011-03-07: 16:00:00 via INTRAVENOUS

## 2011-03-07 MED ORDER — POTASSIUM CHLORIDE CRYS ER 20 MEQ PO TBCR
40.0000 meq | EXTENDED_RELEASE_TABLET | Freq: Once | ORAL | Status: AC
  Administered 2011-03-07: 40 meq via ORAL
  Filled 2011-03-07: qty 2

## 2011-03-07 NOTE — ED Provider Notes (Signed)
History     CSN: [id number]  Arrival date & time 03/07/11  1335   First MD Initiated Contact with Patient 03/07/11 1428      Chief Complaint  Patient presents with  . Panic Attack    anxious with sob stated while in hospital 03/01/10    (Consider location/radiation/quality/duration/timing/severity/associated sxs/prior treatment) HPI  Patient relates he had back surgery done for spinal stenosis by Dr. Shelle Iron 5 days ago. He relates that evening about 11 PM they gave him an oxycodone for pain and he felt like he was having a panic attack about 2 hours later. He states he couldn't rest and they also gave him use the hospital CPAP machine which kept him awake because of the noise. He states his family brought his machine in from home. He states he basically was up all night. He was discharged from the hospital about following day. He relates that evening which was 4 days ago he took a hydrocodone about 9 PM which made him feel jittery and feel like he was panicking. He states he felt like he wanted run outside and screen. He states he's taken the hydrocodone before about a year ago and had no problems. The following next 3 nights he had similar episodes without taking any nighttime pain medications. He states last night he took a whole Ambien 5 mg and had less severe symptoms.  Patient has a history of atrial fibrillation and states in the waiting room he felt like his heart was irregular. He states he does not have chest pain that he's been feeling short of breath. He states he's been having a chronic cough with fever. He denies any pain or swelling in his legs. He states he's had panic attacks once or twice in his lifetime but many years ago. Patient states he has been on Plavix however he stopped his Plavix 5 days before his surgery and did not take it for 3 days after his surgery. He relates he called his orthopedist today and the PA was concerned he may have a blood clot. He was advised to come to  the ERP.  PCP Dr. Cato Mulligan Orthopedist Dr. Shelle Iron Cardiologist Dr. Alanda Amass   Past Medical History  Diagnosis Date  . Alcohol abuse, episodic 05/19/2008  . ALLERGIC RHINITIS 04/15/2009  . COLONIC POLYPS, ADENOMATOUS, HX OF 02/14/2008  . DIVERTICULOSIS, COLON 02/14/2008  . ESOPHAGEAL STRICTURE 01/28/2008  . GERD 02/14/2008  . HYPERGLYCEMIA 11/16/2006  . HYPERLIPIDEMIA 02/14/2008  . INSOMNIA-SLEEP DISORDER-UNSPEC 02/11/2009  . MYOCARDIAL INFARCTION, HX OF 11/16/2006  . 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 PROSTATE CANCER, HX OF 02/25/2000  . TRANSIENT ISCHEMIC ATTACKS, HX OF 11/16/2006  . Arthritis   . Cancer 2002  . Atrial fibrillation 11/16/2006    remote CHF related to atrial fib with rapid ventricular response over 25 yrs per office note,  . ASTHMA 11/16/2006    sinusitis-    hx ?yeast patch/white patch on vocal cord as per [facility] 02/25/11-   . OBSTRUCTIVE SLEEP APNEA 11/10/2008  . SLEEP APNEA 10/03/2009    LOV Dr Maple Hudson 12/12 in [facility]    Moderate per patient- settings ?6/last sleep study years ago  . HYPERTENSION 11/16/2006  . CORONARY ARTERY DISEASE 11/16/2006    nonischemic cardiomyapathy, last stress test 2011- LOV with clearance note Dr  Alanda Amass and EKG 1/13 on chart    Past Surgical History  Procedure Date  . Tonsilectomy, adenoidectomy, bilateral myringotomy and tubes   . Knee arthroscopy  2 surgeries right and 1 left  . Prostate surgery 2/02    prostate cancer  . Uvulopalatopharyngoplasty   . Cardiac catheterization   . Tonsillectomy   . Esophagogastroduodenoscopy     with dilitation    Family History  Problem Relation Age of Onset  . Heart attack Father   . Heart attack Brother   . Heart disease Maternal Uncle   . Heart attack Maternal Uncle     History  Substance Use Topics  . Smoking status: Former Smoker    Types: Cigarettes    Quit date: 03/22/1964  . Smokeless tobacco: Former Neurosurgeon    Quit date: 05/10/1964  . Alcohol Use: 8.4 oz/week    14 Shots  of liquor per week     2 mixed drinks daily  Lives at home with spouse    Review of Systems  All other systems reviewed and are negative.    Allergies  Review of patient's allergies indicates no known allergies.  Home Medications   Current Outpatient Rx  Name Route Sig Dispense Refill  . ALBUTEROL SULFATE HFA 108 (90 BASE) MCG/ACT IN AERS Inhalation Inhale 2 puffs into the lungs every 6 (six) hours as needed. For shortness of breath.    . AMLODIPINE BESYLATE 5 MG PO TABS Oral Take 2.5 mg by mouth daily.     . ASPIRIN 81 MG PO TABS Oral Take 81 mg by mouth daily.     Marland Kitchen CLOPIDOGREL BISULFATE 75 MG PO TABS Oral Take 75 mg by mouth daily.     Marland Kitchen FLECAINIDE ACETATE 50 MG PO TABS Oral Take 50 mg by mouth 2 (two) times daily.     Marland Kitchen FLUTICASONE PROPIONATE 50 MCG/ACT NA SUSP Nasal Place 2 sprays into the nose daily.    Marland Kitchen HYDROCHLOROTHIAZIDE 25 MG PO TABS Oral Take 12.5 mg by mouth daily. Pt takes 1/2 tab for 12.5 mg    . LEVALBUTEROL HCL 1.25 MG/3ML IN NEBU Nebulization Take 1 ampule by nebulization 3 (three) times daily as needed. For asthma     . METHYLCELLULOSE (LAXATIVE) PO POWD Oral Take 1 packet by mouth daily.     Marland Kitchen METOPROLOL SUCCINATE ER 25 MG PO TB24 Oral Take 12.5 mg by mouth at bedtime.     . OXYCODONE-ACETAMINOPHEN 5-325 MG PO TABS Oral Take 1-2 tablets by mouth every 4 (four) hours as needed. 60 tablet 0  . PANTOPRAZOLE SODIUM 40 MG PO TBEC Oral Take 40 mg by mouth daily.     Marland Kitchen RAMIPRIL 2.5 MG PO TABS Oral Take 2.5 mg by mouth daily.     Marland Kitchen SIMVASTATIN 40 MG PO TABS Oral Take 40 mg by mouth at bedtime.     Marland Kitchen ZAFIRLUKAST 20 MG PO TABS Oral Take 40 mg by mouth daily before breakfast.    . ZOLPIDEM TARTRATE 5 MG PO TABS Oral Take 2.5 mg by mouth Nightly.       BP 137/80  Pulse 54  Temp(Src) 97.8 F (36.6 C) (Oral)  Resp 16  Wt 187 lb (84.823 kg)  SpO2 100%  Vital signs normal except bradycardia   Physical Exam  Nursing note and vitals reviewed. Constitutional: He is  oriented to person, place, and time. He appears well-developed and well-nourished.  Non-toxic appearance. He does not appear ill. No distress.  HENT:  Head: Normocephalic and atraumatic.  Right Ear: External ear normal.  Left Ear: External ear normal.  Nose: Nose normal. No mucosal edema or rhinorrhea.  Mouth/Throat: Oropharynx is  clear and moist and mucous membranes are normal. No dental abscesses or uvula swelling.  Eyes: Conjunctivae and EOM are normal. Pupils are equal, round, and reactive to light.  Neck: Normal range of motion and full passive range of motion without pain. Neck supple.  Cardiovascular: Normal rate, regular rhythm and normal heart sounds.  Exam reveals no gallop and no friction rub.   No murmur heard.      She is noted to be in normal sinus rhythm on the monitor during my exam his heart rate was variable between 58 and 65.  Pulmonary/Chest: Effort normal and breath sounds normal. No respiratory distress. He has no wheezes. He has no rhonchi. He has no rales. He exhibits no tenderness and no crepitus.  Abdominal: Soft. Normal appearance and bowel sounds are normal. He exhibits no distension. There is no tenderness. There is no rebound and no guarding.  Musculoskeletal: Normal range of motion. He exhibits no edema and no tenderness.       Moves all extremities well.   Patient's back was examined he has a clean surgical incision in his lower lumbar spine with sutures still intact. There is minimal pinkness to the skin there is no signs of infection or drainage. His dressing is dry without drainage.  Neurological: He is alert and oriented to person, place, and time. He has normal strength. No cranial nerve deficit.  Skin: Skin is warm, dry and intact. No rash noted. No erythema. No pallor.  Psychiatric: He has a normal mood and affect. His speech is normal and behavior is normal. His mood appears not anxious.    ED Course  Procedures (including critical care time)  PT  turned over at change of shift to get CT angio chest results.    Results for orders placed during the hospital encounter of 03/07/11  CBC      Component Value Range   WBC 5.2  4.0 - 10.5 (K/uL)   RBC 4.90  4.22 - 5.81 (MIL/uL)   Hemoglobin 14.5  13.0 - 17.0 (g/dL)   HCT 47.8  29.5 - 62.1 (%)   MCV 83.9  78.0 - 100.0 (fL)   MCH 29.6  26.0 - 34.0 (pg)   MCHC 35.3  30.0 - 36.0 (g/dL)   RDW 30.8  65.7 - 84.6 (%)   Platelets 261  150 - 400 (K/uL)  DIFFERENTIAL      Component Value Range   Neutrophils Relative 60  43 - 77 (%)   Neutro Abs 3.1  1.7 - 7.7 (K/uL)   Lymphocytes Relative 28  12 - 46 (%)   Lymphs Abs 1.4  0.7 - 4.0 (K/uL)   Monocytes Relative 10  3 - 12 (%)   Monocytes Absolute 0.5  0.1 - 1.0 (K/uL)   Eosinophils Relative 2  0 - 5 (%)   Eosinophils Absolute 0.1  0.0 - 0.7 (K/uL)   Basophils Relative 0  0 - 1 (%)   Basophils Absolute 0.0  0.0 - 0.1 (K/uL)  APTT      Component Value Range   aPTT 30  24 - 37 (seconds)  PROTIME-INR      Component Value Range   Prothrombin Time 13.4  11.6 - 15.2 (seconds)   INR 1.00  0.00 - 1.49   POCT I-STAT, CHEM 8      Component Value Range   Sodium 138  135 - 145 (mEq/L)   Potassium 3.2 (*) 3.5 - 5.1 (mEq/L)   Chloride 98  96 -  112 (mEq/L)   BUN 13  6 - 23 (mg/dL)   Creatinine, Ser 1.61  0.50 - 1.35 (mg/dL)   Glucose, Bld 93  70 - 99 (mg/dL)   Calcium, Ion 0.96  0.45 - 1.32 (mmol/L)   TCO2 31  0 - 100 (mmol/L)   Hemoglobin 15.3  13.0 - 17.0 (g/dL)   HCT 40.9  81.1 - 91.4 (%)  POCT I-STAT TROPONIN I      Component Value Range   Troponin i, poc 0.01  0.00 - 0.08 (ng/mL)   Comment 3            Laboratory interpretation all normal except hypokalemia   No results found.   Date: 03/07/2011  Rate: 70  Rhythm: normal sinus rhythm  QRS Axis: normal  Intervals: normal  ST/T Wave abnormalities: nonspecific ST/T changes  Conduction Disutrbances:left bundle branch block  Narrative Interpretation: PVC's  Old EKG Reviewed:  unchanged from 06/07/2009  Preliminary impression Hypokalemia Anxiety  Plan per Dr Roberto Scales, MD, FACEP   MDM          Ward Givens, MD 03/07/11 Paulo Fruit

## 2011-03-14 ENCOUNTER — Encounter: Payer: Self-pay | Admitting: Family

## 2011-03-14 ENCOUNTER — Ambulatory Visit (INDEPENDENT_AMBULATORY_CARE_PROVIDER_SITE_OTHER): Payer: Medicare Other | Admitting: Family

## 2011-03-14 VITALS — BP 122/82 | Temp 97.6°F | Wt 190.0 lb

## 2011-03-14 DIAGNOSIS — F341 Dysthymic disorder: Secondary | ICD-10-CM | POA: Diagnosis not present

## 2011-03-14 DIAGNOSIS — F329 Major depressive disorder, single episode, unspecified: Secondary | ICD-10-CM

## 2011-03-14 DIAGNOSIS — F410 Panic disorder [episodic paroxysmal anxiety] without agoraphobia: Secondary | ICD-10-CM | POA: Diagnosis not present

## 2011-03-14 MED ORDER — PAROXETINE HCL ER 12.5 MG PO TB24: 12.5000 mg | ORAL_TABLET | ORAL | Status: DC

## 2011-03-14 NOTE — Patient Instructions (Addendum)

## 2011-03-14 NOTE — Progress Notes (Signed)
Subjective:    Patient ID: Corey [name], male    DOB: 08-05-1935, 76 y.o.   MRN: [id number]  HPI Comments: C/o new onset intermittent panic attacks started after having back surgery 10 days ago. Took 2 oxycodone post surgery and did not take any pain medications thereafter. Had 12 episodes of panic attacks feeling extreme anxiety and nervousness. PA called in ativan .5mg  every four hrs prn. Takes on occasion bid.  Denies suicidal ideations although has suffered "mild depression" over the last couple years attributed blue feelings over increased age. Denies any on-the-job stress or other stressors. Has history of a-fib diagnosed at age 75, takes plavix. Denies cp, dyspnea, nausea, ab pain, or other associated s/s with onset panic. Takes ambien for sleep and unable to stay asleep wakes at 1a and will pace at times until 7a unable to fall back to sleep.      Review of Systems  Respiratory: Negative.   Cardiovascular: Negative.   Gastrointestinal: Negative.   Psychiatric/Behavioral: Positive for sleep disturbance and decreased concentration. Negative for suicidal ideas, hallucinations, behavioral problems, confusion, self-injury, dysphoric mood and agitation.   Past Medical History  Diagnosis Date  . Alcohol abuse, episodic 05/19/2008  . ALLERGIC RHINITIS 04/15/2009  . COLONIC POLYPS, ADENOMATOUS, HX OF 02/14/2008  . DIVERTICULOSIS, COLON 02/14/2008  . ESOPHAGEAL STRICTURE 01/28/2008  . GERD 02/14/2008  . HYPERGLYCEMIA 11/16/2006  . HYPERLIPIDEMIA 02/14/2008  . INSOMNIA-SLEEP DISORDER-UNSPEC 02/11/2009  . MYOCARDIAL INFARCTION, HX OF 11/16/2006  . 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 PROSTATE CANCER, HX OF 02/25/2000  . TRANSIENT ISCHEMIC ATTACKS, HX OF 11/16/2006  . Arthritis   . Cancer 2002  . Atrial fibrillation 11/16/2006    remote CHF related to atrial fib with rapid ventricular response over 25 yrs per office note,  . ASTHMA 11/16/2006    sinusitis-    hx ?yeast patch/white patch  on vocal cord as per [facility] 02/25/11-   . OBSTRUCTIVE SLEEP APNEA 11/10/2008  . SLEEP APNEA 10/03/2009    LOV Dr Maple Hudson 12/12 in [facility]    Moderate per patient- settings ?6/last sleep study years ago  . HYPERTENSION 11/16/2006  . CORONARY ARTERY DISEASE 11/16/2006    nonischemic cardiomyapathy, last stress test 2011- LOV with clearance note Dr  Alanda Amass and EKG 1/13 on chart    History   Social History  . Marital Status: Married    Spouse Name: N/A    Number of Children: N/A  . Years of Education: N/A   Occupational History  . Not on file.   Social History Main Topics  . Smoking status: Former Smoker    Types: Cigarettes    Quit date: 03/22/1964  . Smokeless tobacco: Former Neurosurgeon    Quit date: 05/10/1964  . Alcohol Use: 8.4 oz/week    14 Shots of liquor per week     2 mixed drinks daily  . Drug Use: No  . Sexually Active: Not on file   Other Topics Concern  . Not on file   Social History Narrative  . No narrative on file    Past Surgical History  Procedure Date  . Tonsilectomy, adenoidectomy, bilateral myringotomy and tubes   . Knee arthroscopy     2 surgeries right and 1 left  . Prostate surgery 2/02    prostate cancer  . Uvulopalatopharyngoplasty   . Cardiac catheterization   . Tonsillectomy   . Esophagogastroduodenoscopy     with dilitation    Family History  Problem Relation Age of Onset  .  Heart attack Father   . Heart attack Brother   . Heart disease Maternal Uncle   . Heart attack Maternal Uncle     No Known Allergies  Current Outpatient Prescriptions on File Prior to Visit  Medication Sig Dispense Refill  . albuterol (PROAIR HFA) 108 (90 BASE) MCG/ACT inhaler Inhale 2 puffs into the lungs every 6 (six) hours as needed. For shortness of breath.      Marland Kitchen amLODipine (NORVASC) 5 MG tablet Take 2.5 mg by mouth daily.       Marland Kitchen aspirin 81 MG tablet Take 81 mg by mouth daily.       . clopidogrel (PLAVIX) 75 MG tablet Take 75 mg by mouth daily.       .  flecainide (TAMBOCOR) 50 MG tablet Take 50 mg by mouth 2 (two) times daily.       . fluticasone (FLONASE) 50 MCG/ACT nasal spray Place 2 sprays into the nose daily.      . hydrochlorothiazide (HYDRODIURIL) 25 MG tablet Take 12.5 mg by mouth daily. Pt takes 1/2 tab for 12.5 mg      . levalbuterol (XOPENEX) 1.25 MG/3ML nebulizer solution Take 1 ampule by nebulization 3 (three) times daily as needed. For asthma       . methylcellulose (CITRUCEL) oral powder Take 1 packet by mouth daily.       . metoprolol succinate (TOPROL-XL) 25 MG 24 hr tablet Take 12.5 mg by mouth at bedtime.       . pantoprazole (PROTONIX) 40 MG tablet Take 40 mg by mouth daily.       . ramipril (ALTACE) 2.5 MG tablet Take 2.5 mg by mouth daily.       . simvastatin (ZOCOR) 40 MG tablet Take 40 mg by mouth at bedtime.       . zafirlukast (ACCOLATE) 20 MG tablet Take 40 mg by mouth daily before breakfast.      . zolpidem (AMBIEN) 5 MG tablet Take 2.5 mg by mouth Nightly.       Marland Kitchen oxyCODONE-acetaminophen (PERCOCET) 5-325 MG per tablet Take 1-2 tablets by mouth every 4 (four) hours as needed.  60 tablet  0    BP 122/82  Temp(Src) 97.6 F (36.4 C) (Oral)  Wt 190 lb (86.183 kg)chart     Objective:   Physical Exam  Constitutional: He is oriented to person, place, and time. He appears well-developed and well-nourished. No distress.  Cardiovascular: Normal rate, normal heart sounds and intact distal pulses.  Exam reveals no gallop and no friction rub.   No murmur heard. Pulmonary/Chest: Effort normal and breath sounds normal. No respiratory distress. He has no wheezes. He has no rales. He exhibits no tenderness.  Neurological: He is alert and oriented to person, place, and time.  Skin: Skin is warm and dry. He is not diaphoretic.             Assessment & Plan:  Assessment: Anxiety, Panic Attacks  Plan: Refusing recommendations to begin SSRI.  Use ativan as prescribed by PA, and start paxil CR once daily. Patient will  go home and read up on paxil CR and decide if he will start the medications.  RTC for follow up Labs: TSH.  Requesting to see MD Swords asap. Collaborated with Dr. Cato Mulligan who agrees with the above treatment plan.

## 2011-03-24 ENCOUNTER — Encounter (HOSPITAL_COMMUNITY): Payer: Self-pay | Admitting: Specialist

## 2011-03-30 ENCOUNTER — Ambulatory Visit (INDEPENDENT_AMBULATORY_CARE_PROVIDER_SITE_OTHER): Payer: Medicare Other | Admitting: Internal Medicine

## 2011-03-30 DIAGNOSIS — R7309 Other abnormal glucose: Secondary | ICD-10-CM

## 2011-03-30 DIAGNOSIS — G4700 Insomnia, unspecified: Secondary | ICD-10-CM | POA: Diagnosis not present

## 2011-03-30 DIAGNOSIS — M48061 Spinal stenosis, lumbar region without neurogenic claudication: Secondary | ICD-10-CM

## 2011-03-30 DIAGNOSIS — E785 Hyperlipidemia, unspecified: Secondary | ICD-10-CM | POA: Diagnosis not present

## 2011-03-30 MED ORDER — ZALEPLON 5 MG PO CAPS: 5.0000 mg | ORAL_CAPSULE | Freq: Every day | ORAL | Status: DC

## 2011-03-30 NOTE — Assessment & Plan Note (Signed)
No polyuria, polydipsia

## 2011-03-30 NOTE — Assessment & Plan Note (Signed)
-   Well-controlled, continue current meds

## 2011-03-30 NOTE — Assessment & Plan Note (Signed)
Has had trouble with sleeping after surgery.  He is currently taking ativan when he gets home in the evening. and then takes Ambien before bedtime

## 2011-03-30 NOTE — Progress Notes (Signed)
Patient ID: Corey [name], male   DOB: 1935/09/14, 76 y.o.   MRN: [id number]  Can't sleep Started after surgery and pain meds He wakes up frequently after sleeping for 3-4 hours. Anxious during the day.  Past Medical History  Diagnosis Date  . Alcohol abuse, episodic 05/19/2008  . ALLERGIC RHINITIS 04/15/2009  . COLONIC POLYPS, ADENOMATOUS, HX OF 02/14/2008  . DIVERTICULOSIS, COLON 02/14/2008  . ESOPHAGEAL STRICTURE 01/28/2008  . GERD 02/14/2008  . HYPERGLYCEMIA 11/16/2006  . HYPERLIPIDEMIA 02/14/2008  . INSOMNIA-SLEEP DISORDER-UNSPEC 02/11/2009  . MYOCARDIAL INFARCTION, HX OF 11/16/2006  . 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 PROSTATE CANCER, HX OF 02/25/2000  . TRANSIENT ISCHEMIC ATTACKS, HX OF 11/16/2006  . Arthritis   . Cancer 2002  . Atrial fibrillation 11/16/2006    remote CHF related to atrial fib with rapid ventricular response over 25 yrs per office note,  . ASTHMA 11/16/2006    sinusitis-    hx ?yeast patch/white patch on vocal cord as per [facility] 02/25/11-   . OBSTRUCTIVE SLEEP APNEA 11/10/2008  . SLEEP APNEA 10/03/2009    LOV Dr Maple Hudson 12/12 in [facility]    Moderate per patient- settings ?6/last sleep study years ago  . HYPERTENSION 11/16/2006  . CORONARY ARTERY DISEASE 11/16/2006    nonischemic cardiomyapathy, last stress test 2011- LOV with clearance note Dr  Alanda Amass and EKG 1/13 on chart    History   Social History  . Marital Status: Married    Spouse Name: N/A    Number of Children: N/A  . Years of Education: N/A   Occupational History  . Not on file.   Social History Main Topics  . Smoking status: Former Smoker    Types: Cigarettes    Quit date: 03/22/1964  . Smokeless tobacco: Former Neurosurgeon    Quit date: 05/10/1964  . Alcohol Use: 8.4 oz/week    14 Shots of liquor per week     2 mixed drinks daily  . Drug Use: No  . Sexually Active: Not on file   Other Topics Concern  . Not on file   Social History Narrative  . No narrative on file    Past Surgical  History  Procedure Date  . Tonsilectomy, adenoidectomy, bilateral myringotomy and tubes   . Knee arthroscopy     2 surgeries right and 1 left  . Prostate surgery 2/02    prostate cancer  . Uvulopalatopharyngoplasty   . Cardiac catheterization   . Tonsillectomy   . Esophagogastroduodenoscopy     with dilitation  . Lumbar laminectomy/decompression microdiscectomy 03/02/2011    Procedure: LUMBAR LAMINECTOMY/DECOMPRESSION MICRODISCECTOMY;  Surgeon: Javier Docker, MD;  Location: [facility] ORS;  Service: Orthopedics;  Laterality: N/A;  Decompression Lumbar 4 - Lumbar(X-Ray)    Family History  Problem Relation Age of Onset  . Heart attack Father   . Heart attack Brother   . Heart disease Maternal Uncle   . Heart attack Maternal Uncle     No Known Allergies  Current Outpatient Prescriptions on File Prior to Visit  Medication Sig Dispense Refill  . albuterol (PROAIR HFA) 108 (90 BASE) MCG/ACT inhaler Inhale 2 puffs into the lungs every 6 (six) hours as needed. For shortness of breath.      Marland Kitchen amLODipine (NORVASC) 5 MG tablet Take 2.5 mg by mouth daily.       Marland Kitchen aspirin 81 MG tablet Take 81 mg by mouth daily.       . clopidogrel (PLAVIX) 75 MG tablet Take 75  mg by mouth daily.       . flecainide (TAMBOCOR) 50 MG tablet Take 50 mg by mouth 2 (two) times daily.       . fluticasone (FLONASE) 50 MCG/ACT nasal spray Place 2 sprays into the nose daily.      . hydrochlorothiazide (HYDRODIURIL) 25 MG tablet Take 12.5 mg by mouth daily. Pt takes 1/2 tab for 12.5 mg      . LORazepam (ATIVAN) 0.5 MG tablet Take 0.5 mg by mouth 2 (two) times daily as needed.       . methylcellulose (CITRUCEL) oral powder Take 1 packet by mouth daily.       . metoprolol succinate (TOPROL-XL) 25 MG 24 hr tablet Take 12.5 mg by mouth at bedtime.       . pantoprazole (PROTONIX) 40 MG tablet Take 40 mg by mouth daily.       . ramipril (ALTACE) 2.5 MG tablet Take 2.5 mg by mouth daily.       . simvastatin (ZOCOR) 40 MG tablet  Take 40 mg by mouth at bedtime.       . zafirlukast (ACCOLATE) 20 MG tablet Take 40 mg by mouth daily before breakfast.         patient denies chest pain, shortness of breath, orthopnea. Denies lower extremity edema, abdominal pain, change in appetite, change in bowel movements. Patient denies rashes, musculoskeletal complaints. No other specific complaints in a complete review of systems.   BP 152/82  Pulse 64  Temp(Src) 98.1 F (36.7 C) (Oral)  Wt 190 lb (86.183 kg)  well-developed well-nourished male in no acute distress. HEENT exam atraumatic, normocephalic, neck supple without jugular venous distention. Chest clear to auscultation cardiac exam S1-S2 are regular. Abdominal exam overweight with bowel sounds, soft and nontender. Extremities no edema.

## 2011-03-30 NOTE — Assessment & Plan Note (Signed)
sxs much improved. He is walking without pain

## 2011-03-31 ENCOUNTER — Encounter: Payer: Self-pay | Admitting: Internal Medicine

## 2011-03-31 ENCOUNTER — Ambulatory Visit (INDEPENDENT_AMBULATORY_CARE_PROVIDER_SITE_OTHER): Payer: Medicare Other

## 2011-03-31 ENCOUNTER — Ambulatory Visit (INDEPENDENT_AMBULATORY_CARE_PROVIDER_SITE_OTHER): Payer: Medicare Other | Admitting: Internal Medicine

## 2011-03-31 DIAGNOSIS — J309 Allergic rhinitis, unspecified: Secondary | ICD-10-CM

## 2011-03-31 DIAGNOSIS — J45909 Unspecified asthma, uncomplicated: Secondary | ICD-10-CM

## 2011-03-31 MED ORDER — LEVALBUTEROL HCL 0.63 MG/3ML IN NEBU: 0.6300 mg | INHALATION_SOLUTION | Freq: Once | RESPIRATORY_TRACT | Status: DC

## 2011-03-31 MED ORDER — BENZONATATE 200 MG PO CAPS: 200.0000 mg | ORAL_CAPSULE | Freq: Three times a day (TID) | ORAL | Status: AC | PRN

## 2011-03-31 NOTE — Progress Notes (Signed)
11/01/10-76 year old male with remote smoking history, followed for chronic asthmatic bronchitis/bronchiectasis, history of sinusitis, allergic rhinitis, sleep apnea Last here 03/15/10 He wants to wait until November for flu shot. He has not had recent or acute symptoms related to his breathing and has not used Advair in 6 months. He didn't find it helpful. Rare use of Proair, maybe once every 3-4 months. He continues allergy vaccine, missing a dose sometimes if he is away from town. He notices no problems and offers no questions. He continues to use CPAP all night every night. He had had a palatoplasty he doesn't remember when and wonders if this was done at the time of his tonsillectomy as a [employer] child. He notices persistent narrowness through the right nostril related to his septal deviation. Nasonex helps. We discussed a trial of Breathe Right strips to be used under his CPAP mask. He can feel when his atrial fibrillation is more active, as in the past 2 days. He treats his difficulty initiating and maintaining sleep with a one half of a 5 mg Ambien tablet when needed.  03/31/11- 11/01/10-76 year old male with remote smoking history, followed for chronic asthmatic bronchitis/bronchiectasis, history of sinusitis, allergic rhinitis, sleep apnea Walked in today as an acute unscheduled visit complaining of cough since lunchtime. He got a throat tickle walking to his car and got gradually worse. Persistent for 4 hours with chest congestion. Chronic feeling of raspy throat clearing. Orthopedic knee surgery one month ago/Dr. Shelle Iron. Oxycodone given in hospital cause shaking, insomnia, panic attack. When he got home he took some leftover hydrocodone and had the same experience, up all night, pacing and anxious. This is set off his chronic anxiety. Dr Shelle Iron called in lorazepam- helps. Continues CPAP all night every night.  ROS-see HPi Constitutional:   No-   weight loss, night sweats, fevers, chills, fatigue,  lassitude. HEENT:   No-  headaches, difficulty swallowing, tooth/dental problems, sore throat,       No-  sneezing, itching, ear ache, nasal congestion, post nasal drip,  CV:  No-   chest pain, orthopnea, PND, swelling in lower extremities, anasarca, +dizziness, palpitations Resp: No-   shortness of breath with exertion or at rest.              No-   productive cough,  + non-productive cough,  No-  coughing up of blood.              No-   change in color of mucus.  No- wheezing.   Skin: No-   rash or lesions. GI:  No-   heartburn, indigestion, abdominal pain, nausea, vomiting, GU: No-   . MS:  No-   joint pain or swelling.   Neuro- grossly normal to observation, Or:  Psych:  No- change in mood or affect. No depression, + anxiety.  No memory loss.  OBJ General- Alert, Oriented, Affect-anxious, Distress- anxious Skin- rash-none, lesions- none, excoriation- none Lymphadenopathy- none Head- atraumatic            Eyes- Gross vision intact, PERRLA, conjunctivae clear secretions            Ears- Hearing, canals-normal            Nose- Clear, ?Septal dev -narrower on the right, no-  mucus, polyps, erosion, perforation             Throat- Mallampati II/ s/p UPPP , mucosa clear , drainage- none, tonsils- atrophic. No visible thrush Neck- flexible , trachea midline, no stridor ,  thyroid nl, carotid no bruit Chest - symmetrical excursion , unlabored           Heart/CV- RRR , no murmur , no gallop  , no rub, nl s1 s2                           - JVD- none , edema- none, stasis changes- none, varices- none           Lung- clear to P&A, wheeze- none, cough- none , dullness-none, rub- none           Chest wall-  Abd-  Br/ Gen/ Rectal- Not done, not indicated Extrem- cyanosis- none, clubbing, none, atrophy- none, strength- nl Neuro- grossly intact to observation, I notice repeated facial grimacing that almost suggests a tic, but it is variable.

## 2011-03-31 NOTE — Patient Instructions (Signed)
You are wheezing some and the cough may just reflect that you are off Advair and Accolate.  Ramipril is an ACE inhibitor, and medicines in that family can cause a persistent dry cough, full feeling and even swelling in throat.- Stop the Ramipril for now.   Script sent for benzonatate pearls for cough  Use sips of liquids, an otc cough syrup like Delsym, and throat lozenges as needed  Keep the follow-up for re-evaluation by Dr Noel Gerold. Taking the magic mouthwash and being off Advair should clear yeast.   Neb xopenex 0.63  I will see you as scheduled in April, or sooner if neeeded.

## 2011-04-01 ENCOUNTER — Telehealth: Payer: Self-pay | Admitting: Internal Medicine

## 2011-04-01 NOTE — Telephone Encounter (Signed)
Questions about instructions for ambien/ativan?? LMOM or with wife

## 2011-04-01 NOTE — Telephone Encounter (Signed)
Should he take the [facility] when he goes to sleep and if he wakes up during the night should he take another one?

## 2011-04-03 ENCOUNTER — Encounter: Payer: Self-pay | Admitting: Internal Medicine

## 2011-04-03 NOTE — Assessment & Plan Note (Addendum)
Acute exacerbation triggered by throat irritation from something he ate. This is the pattern we would expect with a history that he would had demonstrated thrush earlier, and that he had been on ACE inhibitors. His anxious personality has been identified. Currently he is speaking without unusual hoarseness or stridor. There is no evident angioedema in the mouth. He had stopped Advair and Accolate. Plan nebulized Xopenex. Stop ramipril. and watch for effect on cough. Keep followup appointment with his ENT physician. Throat lozenges and sips of liquids.

## 2011-04-04 NOTE — Telephone Encounter (Signed)
No , just if/when he wakes up

## 2011-04-04 NOTE — Telephone Encounter (Signed)
Notified pt. 

## 2011-04-08 DIAGNOSIS — R05 Cough: Secondary | ICD-10-CM | POA: Diagnosis not present

## 2011-04-08 DIAGNOSIS — J310 Chronic rhinitis: Secondary | ICD-10-CM | POA: Diagnosis not present

## 2011-04-08 DIAGNOSIS — J383 Other diseases of vocal cords: Secondary | ICD-10-CM | POA: Diagnosis not present

## 2011-04-08 DIAGNOSIS — R1310 Dysphagia, unspecified: Secondary | ICD-10-CM | POA: Diagnosis not present

## 2011-04-08 DIAGNOSIS — R490 Dysphonia: Secondary | ICD-10-CM | POA: Diagnosis not present

## 2011-04-14 ENCOUNTER — Telehealth: Payer: Self-pay | Admitting: Internal Medicine

## 2011-04-14 ENCOUNTER — Ambulatory Visit (INDEPENDENT_AMBULATORY_CARE_PROVIDER_SITE_OTHER): Payer: Medicare Other

## 2011-04-14 DIAGNOSIS — J309 Allergic rhinitis, unspecified: Secondary | ICD-10-CM

## 2011-04-14 NOTE — Telephone Encounter (Signed)
LMOM for pt TCB 

## 2011-04-15 ENCOUNTER — Telehealth: Payer: Self-pay | Admitting: Internal Medicine

## 2011-04-15 MED ORDER — ESTAZOLAM 2 MG PO TABS: 2.0000 mg | ORAL_TABLET | Freq: Every day | ORAL | Status: DC

## 2011-04-15 NOTE — Telephone Encounter (Signed)
Per CY-okay to restart Ramipril and watch it-can cause throat swelling , dry cough.

## 2011-04-15 NOTE — Telephone Encounter (Signed)
Spoke with pt's spouse and she asked that we call pt's mobile # to speak with him. LMOMTCB x 1.

## 2011-04-15 NOTE — Telephone Encounter (Signed)
LMOMTCB x 1 

## 2011-04-15 NOTE — Telephone Encounter (Signed)
Corey [name] is not working.  Dr Cato Mulligan had told pt not to take med unless he wakes up.  Per pt he can not go to sleep so he takes it when he goes to bed and again when he wakes up at 2 am.  Pt does not want to wait for Dr Cato Mulligan to answer his question cause it is the weekend.  Per Dr Lovell Sheehan ok to call in prosom 2 mg qhs #30/0 refills and pt needs to see Dr Cato Mulligan to discuss meds.  Pt aware, rx called in, appt scheduled also told pt to stop the [facility]

## 2011-04-15 NOTE — Telephone Encounter (Signed)
Pt was seen by Dr. Noel Gerold and states he is going to do surgery to remove the spot on the vocal cord. Pt has stopped Benzonatate and denies that he ever had any cough. He does c/o throat clearing and says he has done this for years. Dr. Maple Hudson had pt stop the Ramipril and pt wants to know he can restart this medication. Pls advise.

## 2011-04-15 NOTE — Telephone Encounter (Signed)
Pt has questions concerning sonata 5 mg.

## 2011-04-15 NOTE — Telephone Encounter (Addendum)
Pt returned call.  Would like CY to know that he is going to have surgery & would like to discuss w/ CY.   Antionette Fairy

## 2011-04-18 ENCOUNTER — Telehealth: Payer: Self-pay | Admitting: Internal Medicine

## 2011-04-18 NOTE — Telephone Encounter (Signed)
No need for OV. Cancel appointment if made. Have him provide phone follow up related to effectiveness of prosom

## 2011-04-18 NOTE — Telephone Encounter (Signed)
Pt states the Prosom gave him diarrhea x 2 days and is NOT taking anymore.  He has been going to bed at 9:30 pm and lays there until 12 and gets up and takes the Sonata to go to sleep.  Does not understand why he cannot just take it when he goes to bed at 9: 30 pm, and repeat if necessary.  Needs ideas.

## 2011-04-18 NOTE — Telephone Encounter (Signed)
LMTCB

## 2011-04-18 NOTE — Telephone Encounter (Signed)
He cannot take twice in one night. Refer to sleep clinic

## 2011-04-18 NOTE — Telephone Encounter (Signed)
Pt is returning deb call

## 2011-04-19 NOTE — Telephone Encounter (Signed)
Pt is upset and wants to see Dr Cato Mulligan.  Scheduled an OV on Thursday at 8:30.  He feels like he is not getting adequate care.

## 2011-04-19 NOTE — Telephone Encounter (Signed)
Left message for pt to call back  °

## 2011-04-19 NOTE — Telephone Encounter (Signed)
Called pt's home number, spoke with patient's wife who advised to call pt's cell number.  She also stated that pt has a standing "luncheon date" at 26, so if unable to reach him can call him at cell number again at 1 or 1:30.  Have LMOM TCB x2 at the cell number.

## 2011-04-20 ENCOUNTER — Ambulatory Visit: Payer: Medicare Other | Admitting: Internal Medicine

## 2011-04-20 NOTE — Telephone Encounter (Signed)
Called, spoke with pt.  I informed him Per CY-okay to restart Ramipril and watch it-can cause throat swelling , dry cough.  He verbalized understanding of this.    Pt states he is scheduled to have vocal cord surgery on April 12 at [facility].  He would like CDY to be aware of this.  Advised will forward msg to CDY as FYI.  Nothing further needed at this time.  Pt will call back if anything further is needed.  Will forward msg to CDY as FYI.

## 2011-04-21 ENCOUNTER — Encounter: Payer: Self-pay | Admitting: Internal Medicine

## 2011-04-21 ENCOUNTER — Ambulatory Visit (INDEPENDENT_AMBULATORY_CARE_PROVIDER_SITE_OTHER): Payer: Medicare Other | Admitting: Internal Medicine

## 2011-04-21 VITALS — BP 146/82 | Temp 97.5°F | Wt 188.0 lb

## 2011-04-21 DIAGNOSIS — G4700 Insomnia, unspecified: Secondary | ICD-10-CM | POA: Diagnosis not present

## 2011-04-21 MED ORDER — ZALEPLON 5 MG PO CAPS: 5.0000 mg | ORAL_CAPSULE | Freq: Every day | ORAL | Status: DC

## 2011-04-21 MED ORDER — ZALEPLON 10 MG PO CAPS: 10.0000 mg | ORAL_CAPSULE | Freq: Every day | ORAL | Status: DC

## 2011-04-21 NOTE — Progress Notes (Signed)
Patient ID: Corey [name], male   DOB: 09-01-1935, 76 y.o.   MRN: [id number] Insomnia--- He now tells me that he has difficulty falling asleep. Last night stayed awake for 3 hours before taking sonata.  Past Medical History  Diagnosis Date  . Alcohol abuse, episodic 05/19/2008  . ALLERGIC RHINITIS 04/15/2009  . COLONIC POLYPS, ADENOMATOUS, HX OF 02/14/2008  . DIVERTICULOSIS, COLON 02/14/2008  . ESOPHAGEAL STRICTURE 01/28/2008  . GERD 02/14/2008  . HYPERGLYCEMIA 11/16/2006  . HYPERLIPIDEMIA 02/14/2008  . INSOMNIA-SLEEP DISORDER-UNSPEC 02/11/2009  . MYOCARDIAL INFARCTION, HX OF 11/16/2006  . 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 PROSTATE CANCER, HX OF 02/25/2000  . TRANSIENT ISCHEMIC ATTACKS, HX OF 11/16/2006  . Arthritis   . Cancer 2002  . Atrial fibrillation 11/16/2006    remote CHF related to atrial fib with rapid ventricular response over 25 yrs per office note,  . ASTHMA 11/16/2006    sinusitis-    hx ?yeast patch/white patch on vocal cord as per [facility] 02/25/11-   . OBSTRUCTIVE SLEEP APNEA 11/10/2008  . SLEEP APNEA 10/03/2009    LOV Dr Maple Hudson 12/12 in [facility]    Moderate per patient- settings ?6/last sleep study years ago  . HYPERTENSION 11/16/2006  . CORONARY ARTERY DISEASE 11/16/2006    nonischemic cardiomyapathy, last stress test 2011- LOV with clearance note Dr  Alanda Amass and EKG 1/13 on chart    History   Social History  . Marital Status: Married    Spouse Name: N/A    Number of Children: N/A  . Years of Education: N/A   Occupational History  . Not on file.   Social History Main Topics  . Smoking status: Former Smoker    Types: Cigarettes    Quit date: 03/22/1964  . Smokeless tobacco: Former Neurosurgeon    Quit date: 05/10/1964  . Alcohol Use: 8.4 oz/week    14 Shots of liquor per week     2 mixed drinks daily  . Drug Use: No  . Sexually Active: Not on file   Other Topics Concern  . Not on file   Social History Narrative  . No narrative on file    Past Surgical  History  Procedure Date  . Tonsilectomy, adenoidectomy, bilateral myringotomy and tubes   . Knee arthroscopy     2 surgeries right and 1 left  . Prostate surgery 2/02    prostate cancer  . Uvulopalatopharyngoplasty   . Cardiac catheterization   . Tonsillectomy   . Esophagogastroduodenoscopy     with dilitation  . Lumbar laminectomy/decompression microdiscectomy 03/02/2011    Procedure: LUMBAR LAMINECTOMY/DECOMPRESSION MICRODISCECTOMY;  Surgeon: Javier Docker, MD;  Location: [facility];  Service: Orthopedics;  Laterality: N/A;  Decompression Lumbar 4 - Lumbar(X-Ray)    Family History  Problem Relation Age of Onset  . Heart attack Father   . Heart attack Brother   . Heart disease Maternal Uncle   . Heart attack Maternal Uncle     No Known Allergies  Current Outpatient Prescriptions on File Prior to Visit  Medication Sig Dispense Refill  . albuterol (PROAIR HFA) 108 (90 BASE) MCG/ACT inhaler Inhale 2 puffs into the lungs every 6 (six) hours as needed. For shortness of breath.      Marland Kitchen amLODipine (NORVASC) 5 MG tablet Take 2.5 mg by mouth daily.       Marland Kitchen aspirin 81 MG tablet Take 81 mg by mouth daily.       . clopidogrel (PLAVIX) 75 MG tablet Take 75 mg  by mouth daily.       . flecainide (TAMBOCOR) 50 MG tablet Take 50 mg by mouth 2 (two) times daily.       . fluticasone (FLONASE) 50 MCG/ACT nasal spray Place 2 sprays into the nose daily.      . hydrochlorothiazide (HYDRODIURIL) 25 MG tablet Take 12.5 mg by mouth daily. Pt takes 1/2 tab for 12.5 mg      . LORazepam (ATIVAN) 0.5 MG tablet Take 0.5 mg by mouth 2 (two) times daily as needed.       . methylcellulose (CITRUCEL) oral powder Take 1 packet by mouth daily.       . metoprolol succinate (TOPROL-XL) 25 MG 24 hr tablet Take 12.5 mg by mouth at bedtime.       . pantoprazole (PROTONIX) 40 MG tablet Take 40 mg by mouth daily.       . ramipril (ALTACE) 2.5 MG tablet Take 2.5 mg by mouth daily.       . simvastatin (ZOCOR) 40 MG tablet  Take 40 mg by mouth at bedtime.       . zafirlukast (ACCOLATE) 20 MG tablet Take 40 mg by mouth daily before breakfast.      . DISCONTD: zaleplon (SONATA) 5 MG capsule Take 1 capsule (5 mg total) by mouth at bedtime.  30 capsule  0   Current Facility-Administered Medications on File Prior to Visit  Medication Dose Route Frequency Provider Last Rate Last Dose  . levalbuterol (XOPENEX) nebulizer solution 0.63 mg  0.63 mg Nebulization Once Waymon Budge, MD         patient denies chest pain, shortness of breath, orthopnea. Denies lower extremity edema, abdominal pain, change in appetite, change in bowel movements. Patient denies rashes, musculoskeletal complaints. No other specific complaints in a complete review of systems.   BP 146/82  Temp(Src) 97.5 F (36.4 C) (Oral)  Wt 188 lb (85.276 kg)  well-developed well-nourished male in no acute distress. HEENT exam atraumatic, normocephalic, neck supple without jugular venous distention. Chest clear to auscultation cardiac exam S1-S2 are regular. Abdominal exam overweight with bowel sounds, soft and nontender.

## 2011-04-21 NOTE — Assessment & Plan Note (Signed)
Discussed at length. I have advised him that he can take sonata once daily. He admits that an upcoming surgery is also bothering him (vocal cord polyp).

## 2011-04-25 ENCOUNTER — Encounter: Payer: Self-pay | Admitting: Internal Medicine

## 2011-04-25 DIAGNOSIS — K219 Gastro-esophageal reflux disease without esophagitis: Secondary | ICD-10-CM | POA: Diagnosis not present

## 2011-04-25 DIAGNOSIS — E7800 Pure hypercholesterolemia, unspecified: Secondary | ICD-10-CM | POA: Diagnosis not present

## 2011-04-25 DIAGNOSIS — I2510 Atherosclerotic heart disease of native coronary artery without angina pectoris: Secondary | ICD-10-CM | POA: Diagnosis not present

## 2011-04-25 DIAGNOSIS — Z01810 Encounter for preprocedural cardiovascular examination: Secondary | ICD-10-CM | POA: Diagnosis not present

## 2011-04-25 DIAGNOSIS — Z01818 Encounter for other preprocedural examination: Secondary | ICD-10-CM | POA: Diagnosis not present

## 2011-04-25 DIAGNOSIS — G4730 Sleep apnea, unspecified: Secondary | ICD-10-CM | POA: Diagnosis not present

## 2011-04-25 DIAGNOSIS — R040 Epistaxis: Secondary | ICD-10-CM | POA: Diagnosis not present

## 2011-04-25 DIAGNOSIS — M48061 Spinal stenosis, lumbar region without neurogenic claudication: Secondary | ICD-10-CM | POA: Diagnosis not present

## 2011-04-25 DIAGNOSIS — I10 Essential (primary) hypertension: Secondary | ICD-10-CM | POA: Diagnosis not present

## 2011-04-25 DIAGNOSIS — I4891 Unspecified atrial fibrillation: Secondary | ICD-10-CM | POA: Diagnosis not present

## 2011-04-25 DIAGNOSIS — J383 Other diseases of vocal cords: Secondary | ICD-10-CM | POA: Diagnosis not present

## 2011-05-02 ENCOUNTER — Ambulatory Visit: Payer: Medicare Other | Admitting: Internal Medicine

## 2011-05-02 ENCOUNTER — Telehealth: Payer: Self-pay | Admitting: Internal Medicine

## 2011-05-02 MED ORDER — ZALEPLON 5 MG PO CAPS: 5.0000 mg | ORAL_CAPSULE | Freq: Every day | ORAL | Status: DC

## 2011-05-02 NOTE — Telephone Encounter (Signed)
rx faxed to pharmacy

## 2011-05-02 NOTE — Telephone Encounter (Signed)
Pt is going into surgery at [facility] next week and is requesting a refill on zaleplon (SONATA) 5 MG capsule  [facility] -

## 2011-05-04 ENCOUNTER — Ambulatory Visit (INDEPENDENT_AMBULATORY_CARE_PROVIDER_SITE_OTHER): Payer: Medicare Other

## 2011-05-04 DIAGNOSIS — M48061 Spinal stenosis, lumbar region without neurogenic claudication: Secondary | ICD-10-CM | POA: Diagnosis not present

## 2011-05-04 DIAGNOSIS — J309 Allergic rhinitis, unspecified: Secondary | ICD-10-CM | POA: Diagnosis not present

## 2011-05-06 DIAGNOSIS — Z8673 Personal history of transient ischemic attack (TIA), and cerebral infarction without residual deficits: Secondary | ICD-10-CM | POA: Diagnosis not present

## 2011-05-06 DIAGNOSIS — M2603 Mandibular hyperplasia: Secondary | ICD-10-CM | POA: Diagnosis not present

## 2011-05-06 DIAGNOSIS — Z7902 Long term (current) use of antithrombotics/antiplatelets: Secondary | ICD-10-CM | POA: Diagnosis not present

## 2011-05-06 DIAGNOSIS — J45909 Unspecified asthma, uncomplicated: Secondary | ICD-10-CM | POA: Diagnosis not present

## 2011-05-06 DIAGNOSIS — Z87891 Personal history of nicotine dependence: Secondary | ICD-10-CM | POA: Diagnosis not present

## 2011-05-06 DIAGNOSIS — K222 Esophageal obstruction: Secondary | ICD-10-CM | POA: Diagnosis not present

## 2011-05-06 DIAGNOSIS — Z79899 Other long term (current) drug therapy: Secondary | ICD-10-CM | POA: Diagnosis not present

## 2011-05-06 DIAGNOSIS — J387 Other diseases of larynx: Secondary | ICD-10-CM | POA: Diagnosis not present

## 2011-05-06 DIAGNOSIS — E785 Hyperlipidemia, unspecified: Secondary | ICD-10-CM | POA: Diagnosis not present

## 2011-05-06 DIAGNOSIS — I2510 Atherosclerotic heart disease of native coronary artery without angina pectoris: Secondary | ICD-10-CM | POA: Diagnosis not present

## 2011-05-06 DIAGNOSIS — G4730 Sleep apnea, unspecified: Secondary | ICD-10-CM | POA: Diagnosis not present

## 2011-05-06 DIAGNOSIS — K219 Gastro-esophageal reflux disease without esophagitis: Secondary | ICD-10-CM | POA: Diagnosis not present

## 2011-05-06 DIAGNOSIS — J383 Other diseases of vocal cords: Secondary | ICD-10-CM | POA: Diagnosis not present

## 2011-05-06 DIAGNOSIS — Z7982 Long term (current) use of aspirin: Secondary | ICD-10-CM | POA: Diagnosis not present

## 2011-05-06 DIAGNOSIS — IMO0002 Reserved for concepts with insufficient information to code with codable children: Secondary | ICD-10-CM | POA: Diagnosis not present

## 2011-05-06 DIAGNOSIS — I4891 Unspecified atrial fibrillation: Secondary | ICD-10-CM | POA: Diagnosis not present

## 2011-05-06 DIAGNOSIS — Z8719 Personal history of other diseases of the digestive system: Secondary | ICD-10-CM | POA: Diagnosis not present

## 2011-05-06 DIAGNOSIS — I10 Essential (primary) hypertension: Secondary | ICD-10-CM | POA: Diagnosis not present

## 2011-05-06 DIAGNOSIS — I252 Old myocardial infarction: Secondary | ICD-10-CM | POA: Diagnosis not present

## 2011-05-10 ENCOUNTER — Other Ambulatory Visit: Payer: Self-pay | Admitting: Internal Medicine

## 2011-05-12 ENCOUNTER — Ambulatory Visit (INDEPENDENT_AMBULATORY_CARE_PROVIDER_SITE_OTHER): Payer: Medicare Other

## 2011-05-12 DIAGNOSIS — J309 Allergic rhinitis, unspecified: Secondary | ICD-10-CM | POA: Diagnosis not present

## 2011-05-16 ENCOUNTER — Ambulatory Visit (INDEPENDENT_AMBULATORY_CARE_PROVIDER_SITE_OTHER): Payer: Medicare Other

## 2011-05-16 ENCOUNTER — Encounter: Payer: Self-pay | Admitting: Internal Medicine

## 2011-05-16 ENCOUNTER — Ambulatory Visit (INDEPENDENT_AMBULATORY_CARE_PROVIDER_SITE_OTHER): Payer: Medicare Other | Admitting: Internal Medicine

## 2011-05-16 VITALS — BP 130/80 | HR 55 | Ht 76.0 in | Wt 182.6 lb

## 2011-05-16 DIAGNOSIS — I4891 Unspecified atrial fibrillation: Secondary | ICD-10-CM | POA: Diagnosis not present

## 2011-05-16 DIAGNOSIS — J382 Nodules of vocal cords: Secondary | ICD-10-CM

## 2011-05-16 DIAGNOSIS — J45909 Unspecified asthma, uncomplicated: Secondary | ICD-10-CM

## 2011-05-16 DIAGNOSIS — J309 Allergic rhinitis, unspecified: Secondary | ICD-10-CM

## 2011-05-16 DIAGNOSIS — G4733 Obstructive sleep apnea (adult) (pediatric): Secondary | ICD-10-CM

## 2011-05-16 DIAGNOSIS — J383 Other diseases of vocal cords: Secondary | ICD-10-CM | POA: Diagnosis not present

## 2011-05-16 NOTE — Patient Instructions (Signed)
Please call as needed 

## 2011-05-16 NOTE — Progress Notes (Signed)
11/01/10-76 year old male with remote smoking history, followed for chronic asthmatic bronchitis/bronchiectasis, history of sinusitis, allergic rhinitis, sleep apnea Last here 03/15/10 He wants to wait until November for flu shot. He has not had recent or acute symptoms related to his breathing and has not used Advair in 6 months. He didn't find it helpful. Rare use of Proair, maybe once every 3-4 months. He continues allergy vaccine, missing a dose sometimes if he is away from town. He notices no problems and offers no questions. He continues to use CPAP all night every night. He had had a palatoplasty he doesn't remember when and wonders if this was done at the time of his tonsillectomy as a [employer] child. He notices persistent narrowness through the right nostril related to his septal deviation. Nasonex helps. We discussed a trial of Breathe Right strips to be used under his CPAP mask. He can feel when his atrial fibrillation is more active, as in the past 2 days. He treats his difficulty initiating and maintaining sleep with a one half of a 5 mg Ambien tablet when needed.   11/01/10-76 year old male with remote smoking history, followed for chronic asthmatic bronchitis/bronchiectasis, history of sinusitis, allergic rhinitis, sleep apnea Walked in today as an acute unscheduled visit complaining of cough since lunchtime. He got a throat tickle walking to his car and got gradually worse. Persistent for 4 hours with chest congestion. Chronic feeling of raspy throat clearing. Orthopedic knee surgery one month ago/Dr. Shelle Iron. Oxycodone given in hospital cause shaking, insomnia, panic attack. When he got home he took some leftover hydrocodone and had the same experience, up all night, pacing and anxious. This is set off his chronic anxiety. Dr Shelle Iron called in lorazepam- helps. Continues CPAP all night every night.  05/16/11- 76 year old male with remote smoking history, followed for chronic asthmatic  bronchitis/bronchiectasis, history of sinusitis, allergic rhinitis, sleep apnea, AFib/ CAD/ MI, GERD He admits his anxiety is getting to him. He had a benign lesion biopsied on his vocal cord at [facility]. Apparently he was put to sleep with intention to remove the lesion but they found they could not. He is left persistently hoarse.  He had just had back surgery 8 weeks previously and has been taking oxycodone for back pain. Lorazepam helps him sleep at night.  CPAP continues all night every night with good control. 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  ROS-see HPi Constitutional:   No-   weight loss, night sweats, fevers, chills, fatigue, lassitude. HEENT:   No-  headaches, difficulty swallowing, tooth/dental problems, sore throat,       No-  sneezing, itching, ear ache, nasal congestion, post nasal drip,  CV:  No-   chest pain, orthopnea, PND, swelling in lower extremities, anasarca, +dizziness, palpitations Resp: No-   shortness of breath with exertion or at rest.              No-   productive cough,  + non-productive cough,  No-  coughing up of blood.              No-   change in color of mucus.  No- wheezing.   Skin: No-   rash or lesions. GI:  No-   heartburn, indigestion, abdominal pain, nausea, vomiting, GU: No-   . MS:  No-   joint pain or swelling.   Neuro- Nothing unusual Psych:  + change in  mood or affect. No depression, + anxiety.  No memory loss.  OBJ General- Alert, Oriented, Affect-anxious, Distress- anxious Skin- rash-none, lesions- none, excoriation- none Lymphadenopathy- none Head- atraumatic            Eyes- Gross vision intact, PERRLA, conjunctivae clear secretions            Ears- Hearing, canals-normal            Nose- Clear, ?Septal dev -narrower on the right, no-  mucus, polyps, erosion, perforation             Throat- Mallampati II/ s/p UPPP , mucosa clear  , drainage- none, tonsils- atrophic. Very hoarse Neck- flexible , trachea midline, no stridor , thyroid nl, carotid no bruit Chest - symmetrical excursion , unlabored           Heart/CV- RRR , no murmur , no gallop  , no rub, nl s1 s2                           - JVD- none , edema- none, stasis changes- none, varices- none           Lung- clear to P&A, wheeze- none, cough- none , dullness-none, rub- none           Chest wall-  Abd-  Br/ Gen/ Rectal- Not done, not indicated Extrem- cyanosis- none, clubbing, none, atrophy- none, strength- nl Neuro- grossly intact to observation, I notice repeated facial grimacing that almost suggests a tic, but it is variable.

## 2011-05-17 ENCOUNTER — Telehealth: Payer: Self-pay | Admitting: Internal Medicine

## 2011-05-17 NOTE — Telephone Encounter (Signed)
I called patient to see what was going on-he states that he had a Spicy chicken sandwich at lunch and since then has had to clear his throat more and feelings of burning sensation causing him to feel like something is stuck there. Pt advised that he is not having any trouble with swallowing or breathing at this time. I advised patient to continue taking his Protonix and add Gaviscon OTC to help coat the throat and stomach for acid reflux. Pt verbalized his understanding that if he starts to have any trouble breathing or swallowing; symptoms seem to get worse then he should go to his closest ER to be evaluated.

## 2011-05-17 NOTE — Telephone Encounter (Signed)
Pt is very upset that he hasn't gotten a returned phone call as of yet.  Advised pt that a nurse will call him, but CY is with a patient at the moment.  Advised pt if he is unable to wait for a phone call back that he can go to the ED or UC.   Antionette Fairy

## 2011-05-19 ENCOUNTER — Telehealth: Payer: Self-pay | Admitting: Internal Medicine

## 2011-05-19 DIAGNOSIS — J387 Other diseases of larynx: Secondary | ICD-10-CM | POA: Diagnosis not present

## 2011-05-19 DIAGNOSIS — K219 Gastro-esophageal reflux disease without esophagitis: Secondary | ICD-10-CM | POA: Diagnosis not present

## 2011-05-19 DIAGNOSIS — J382 Nodules of vocal cords: Secondary | ICD-10-CM | POA: Insufficient documentation

## 2011-05-19 DIAGNOSIS — J310 Chronic rhinitis: Secondary | ICD-10-CM | POA: Diagnosis not present

## 2011-05-19 DIAGNOSIS — J45909 Unspecified asthma, uncomplicated: Secondary | ICD-10-CM | POA: Diagnosis not present

## 2011-05-19 MED ORDER — CITALOPRAM HYDROBROMIDE 20 MG PO TABS: 20.0000 mg | ORAL_TABLET | Freq: Every day | ORAL | Status: DC

## 2011-05-19 NOTE — Assessment & Plan Note (Signed)
Pulse feels almost regular on exam.

## 2011-05-19 NOTE — Telephone Encounter (Signed)
Pt called went to [facility] in [place] re: allergy issues and was recommended that Dr Cato Mulligan prescribe an antidepressant. Pt is currently taking Ativan to help calm pt down, but pt is req a med to take in addition to Ativan. Pt was in to see Adline Mango and she had recommended an antidepressant med and it should have been noted in pts chart. Pls call in to [facility]. Req that this med be called in today.

## 2011-05-19 NOTE — Assessment & Plan Note (Signed)
Hoarseness without apparent dysphagia or respiratory obstruction, following biopsy but not total excision, of a reportedly benign vocal cord lesion.

## 2011-05-19 NOTE — Assessment & Plan Note (Signed)
Well controlled 

## 2011-05-19 NOTE — Assessment & Plan Note (Signed)
He continues vaccine without problem. I don't think this hoarseness is an allergic problem.

## 2011-05-19 NOTE — Telephone Encounter (Signed)
As documented in office visit note, pt previously refused SSRI. Per Oran Rein, begin pt on celexa 20mg  qd and pt to follow up in 3 weeks. Rx sent to pharmacy

## 2011-05-20 NOTE — Telephone Encounter (Signed)
Pt is calling in concerned why his medication bottle of Celexa has Padonda's name on it as a doctor.?  Seems to be some confusion with him re: who is responsible for prescribing the medication.  I attempted to explain that his MD from [facility] and originally suggested a SSRI, the chart states that he refused the treatment.  Assured him that Oran Rein is perfectly capable of prescribing this medication, and if he is concerned about anything regarding how it was documented at the pharmacy, he can call them.  He stated he had already spoken to the pharmacy, and they said everything was fine.

## 2011-05-23 DIAGNOSIS — R490 Dysphonia: Secondary | ICD-10-CM | POA: Diagnosis not present

## 2011-05-23 DIAGNOSIS — J383 Other diseases of vocal cords: Secondary | ICD-10-CM | POA: Diagnosis not present

## 2011-05-23 NOTE — Telephone Encounter (Signed)
This was done last phone call.

## 2011-05-23 NOTE — Telephone Encounter (Signed)
Ok to stay on citalopram (or start it)

## 2011-05-24 DIAGNOSIS — H6120 Impacted cerumen, unspecified ear: Secondary | ICD-10-CM | POA: Diagnosis not present

## 2011-05-25 DIAGNOSIS — H9319 Tinnitus, unspecified ear: Secondary | ICD-10-CM | POA: Diagnosis not present

## 2011-05-28 DIAGNOSIS — Z79899 Other long term (current) drug therapy: Secondary | ICD-10-CM | POA: Diagnosis not present

## 2011-05-28 DIAGNOSIS — F411 Generalized anxiety disorder: Secondary | ICD-10-CM | POA: Diagnosis not present

## 2011-05-30 DIAGNOSIS — F4322 Adjustment disorder with anxiety: Secondary | ICD-10-CM | POA: Diagnosis not present

## 2011-06-01 ENCOUNTER — Telehealth: Payer: Self-pay | Admitting: Internal Medicine

## 2011-06-01 DIAGNOSIS — G479 Sleep disorder, unspecified: Secondary | ICD-10-CM

## 2011-06-01 DIAGNOSIS — M48061 Spinal stenosis, lumbar region without neurogenic claudication: Secondary | ICD-10-CM | POA: Diagnosis not present

## 2011-06-01 NOTE — Telephone Encounter (Signed)
Pt called req work in ov with Dr Cato Mulligan only re: multiple issues. Pt said that his ears are really causing problems. A lot of ringing and buzzing. Pt not sleeping.  Pt refuses to see another dr. Request to get a referral to an ENT.

## 2011-06-01 NOTE — Telephone Encounter (Signed)
Left message for pt to call back  °

## 2011-06-02 NOTE — Telephone Encounter (Signed)
Pt is aware cindy will put referral in for pt to see ENT for buzzing and ringing in ears

## 2011-06-03 ENCOUNTER — Ambulatory Visit (INDEPENDENT_AMBULATORY_CARE_PROVIDER_SITE_OTHER): Payer: Medicare Other

## 2011-06-03 DIAGNOSIS — J309 Allergic rhinitis, unspecified: Secondary | ICD-10-CM | POA: Diagnosis not present

## 2011-06-03 NOTE — Telephone Encounter (Signed)
Per Dr Cato Mulligan ok to refer to ENT and he has seen pt for sleep issues in the past multiple times.  Dr Cato Mulligan said he has suggested a sleep specialist in the past and he has refused.  Now is the time pt seen someone for it. Pt aware referral entered for pulomonary

## 2011-06-06 DIAGNOSIS — R5383 Other fatigue: Secondary | ICD-10-CM | POA: Diagnosis not present

## 2011-06-06 DIAGNOSIS — L578 Other skin changes due to chronic exposure to nonionizing radiation: Secondary | ICD-10-CM | POA: Diagnosis not present

## 2011-06-06 DIAGNOSIS — L821 Other seborrheic keratosis: Secondary | ICD-10-CM | POA: Diagnosis not present

## 2011-06-06 DIAGNOSIS — R5381 Other malaise: Secondary | ICD-10-CM | POA: Diagnosis not present

## 2011-06-06 DIAGNOSIS — R6889 Other general symptoms and signs: Secondary | ICD-10-CM | POA: Diagnosis not present

## 2011-06-06 DIAGNOSIS — L723 Sebaceous cyst: Secondary | ICD-10-CM | POA: Diagnosis not present

## 2011-06-06 DIAGNOSIS — D1801 Hemangioma of skin and subcutaneous tissue: Secondary | ICD-10-CM | POA: Diagnosis not present

## 2011-06-06 DIAGNOSIS — M48061 Spinal stenosis, lumbar region without neurogenic claudication: Secondary | ICD-10-CM | POA: Diagnosis not present

## 2011-06-06 DIAGNOSIS — I447 Left bundle-branch block, unspecified: Secondary | ICD-10-CM | POA: Diagnosis not present

## 2011-06-08 ENCOUNTER — Ambulatory Visit (INDEPENDENT_AMBULATORY_CARE_PROVIDER_SITE_OTHER): Payer: Medicare Other

## 2011-06-08 DIAGNOSIS — J309 Allergic rhinitis, unspecified: Secondary | ICD-10-CM | POA: Diagnosis not present

## 2011-06-08 DIAGNOSIS — M48061 Spinal stenosis, lumbar region without neurogenic claudication: Secondary | ICD-10-CM | POA: Diagnosis not present

## 2011-06-08 DIAGNOSIS — R002 Palpitations: Secondary | ICD-10-CM | POA: Diagnosis not present

## 2011-06-09 ENCOUNTER — Other Ambulatory Visit: Payer: Self-pay | Admitting: Internal Medicine

## 2011-06-09 DIAGNOSIS — F4322 Adjustment disorder with anxiety: Secondary | ICD-10-CM | POA: Diagnosis not present

## 2011-06-14 ENCOUNTER — Ambulatory Visit (INDEPENDENT_AMBULATORY_CARE_PROVIDER_SITE_OTHER): Payer: Medicare Other

## 2011-06-14 ENCOUNTER — Other Ambulatory Visit: Payer: Self-pay | Admitting: Internal Medicine

## 2011-06-14 DIAGNOSIS — J309 Allergic rhinitis, unspecified: Secondary | ICD-10-CM | POA: Diagnosis not present

## 2011-06-14 MED ORDER — PANTOPRAZOLE SODIUM 40 MG PO TBEC: 40.0000 mg | DELAYED_RELEASE_TABLET | Freq: Every day | ORAL | Status: DC

## 2011-06-14 NOTE — Telephone Encounter (Signed)
RX Sent. 

## 2011-06-16 DIAGNOSIS — H9319 Tinnitus, unspecified ear: Secondary | ICD-10-CM | POA: Diagnosis not present

## 2011-06-22 DIAGNOSIS — M48061 Spinal stenosis, lumbar region without neurogenic claudication: Secondary | ICD-10-CM | POA: Diagnosis not present

## 2011-06-24 ENCOUNTER — Encounter: Payer: Self-pay | Admitting: *Deleted

## 2011-06-25 HISTORY — PX: PACEMAKER INSERTION: SHX728

## 2011-06-29 ENCOUNTER — Encounter: Payer: Self-pay | Admitting: Internal Medicine

## 2011-06-29 ENCOUNTER — Ambulatory Visit (INDEPENDENT_AMBULATORY_CARE_PROVIDER_SITE_OTHER): Payer: Medicare Other | Admitting: Internal Medicine

## 2011-06-29 ENCOUNTER — Telehealth: Payer: Self-pay | Admitting: Internal Medicine

## 2011-06-29 VITALS — BP 134/78 | HR 48 | Temp 97.6°F | Wt 188.0 lb

## 2011-06-29 DIAGNOSIS — R001 Bradycardia, unspecified: Secondary | ICD-10-CM

## 2011-06-29 DIAGNOSIS — I498 Other specified cardiac arrhythmias: Secondary | ICD-10-CM | POA: Diagnosis not present

## 2011-06-29 DIAGNOSIS — I10 Essential (primary) hypertension: Secondary | ICD-10-CM

## 2011-06-29 DIAGNOSIS — F411 Generalized anxiety disorder: Secondary | ICD-10-CM | POA: Diagnosis not present

## 2011-06-29 DIAGNOSIS — F419 Anxiety disorder, unspecified: Secondary | ICD-10-CM

## 2011-06-29 DIAGNOSIS — I4891 Unspecified atrial fibrillation: Secondary | ICD-10-CM | POA: Diagnosis not present

## 2011-06-29 NOTE — Assessment & Plan Note (Signed)
Has f/u with dr. Donell Beers Stop klonopin as it makes him too sleepy He will f/u with dr. Donell Beers

## 2011-06-29 NOTE — Patient Instructions (Signed)
STOP KLONOPIN- it makes you too sleepy Stop METOPROLOL- it makes your heart rate too slow-- talk with Dr. Alanda [name]

## 2011-06-29 NOTE — Assessment & Plan Note (Signed)
Reasonable control Continue same meds 

## 2011-06-29 NOTE — Progress Notes (Signed)
Patient ID: Corey [name], male   DOB: January 27, 1935, 76 y.o.   MRN: [id number]  Is seeing dr Donell Beers (psych)- prescribed a medication - causes daytime sleepiness, has night time insomnia. He can't remember the name of the medciations.  Called his wife to determine names of meds  He also felt as if his heart rate is slow ("skips a lot of beats")  Past Medical History  Diagnosis Date  . Alcohol abuse, episodic 05/19/2008  . ALLERGIC RHINITIS 04/15/2009  . COLONIC POLYPS, ADENOMATOUS, HX OF 02/14/2008  . DIVERTICULOSIS, COLON 02/14/2008  . ESOPHAGEAL STRICTURE 01/28/2008  . GERD 02/14/2008  . HYPERGLYCEMIA 11/16/2006  . HYPERLIPIDEMIA 02/14/2008  . INSOMNIA-SLEEP DISORDER-UNSPEC 02/11/2009  . MYOCARDIAL INFARCTION, HX OF 11/16/2006  . 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 PROSTATE CANCER, HX OF 02/25/2000  . TRANSIENT ISCHEMIC ATTACKS, HX OF 11/16/2006  . Arthritis   . Cancer 2002  . Atrial fibrillation 11/16/2006    remote CHF related to atrial fib with rapid ventricular response over 25 yrs per office note,  . ASTHMA 11/16/2006    sinusitis-    hx ?yeast patch/white patch on vocal cord as per [facility] 02/25/11-   . OBSTRUCTIVE SLEEP APNEA 11/10/2008  . SLEEP APNEA 10/03/2009    LOV Dr Maple Hudson 12/12 in [facility]    Moderate per patient- settings ?6/last sleep study years ago  . HYPERTENSION 11/16/2006  . CORONARY ARTERY DISEASE 11/16/2006    nonischemic cardiomyapathy, last stress test 2011- LOV with clearance note Dr  Alanda Amass and EKG 1/13 on chart    History   Social History  . Marital Status: Married    Spouse Name: N/A    Number of Children: N/A  . Years of Education: N/A   Occupational History  . Not on file.   Social History Main Topics  . Smoking status: Former Smoker    Types: Cigarettes    Quit date: 03/22/1964  . Smokeless tobacco: Former Neurosurgeon    Quit date: 05/10/1964  . Alcohol Use: 8.4 oz/week    14 Shots of liquor per week     2 mixed drinks daily  . Drug Use: No  .  Sexually Active: Not on file   Other Topics Concern  . Not on file   Social History Narrative  . No narrative on file    Past Surgical History  Procedure Date  . Tonsilectomy, adenoidectomy, bilateral myringotomy and tubes   . Knee arthroscopy     2 surgeries right and 1 left  . Prostate surgery 2/02    prostate cancer  . Uvulopalatopharyngoplasty   . Cardiac catheterization   . Tonsillectomy   . Esophagogastroduodenoscopy     with dilitation  . Lumbar laminectomy/decompression microdiscectomy 03/02/2011    Procedure: LUMBAR LAMINECTOMY/DECOMPRESSION MICRODISCECTOMY;  Surgeon: Javier Docker, MD;  Location: [facility];  Service: Orthopedics;  Laterality: N/A;  Decompression Lumbar 4 - Lumbar(X-Ray)    Family History  Problem Relation Age of Onset  . Heart attack Father   . Heart attack Brother   . Heart disease Maternal Uncle   . Heart attack Maternal Uncle   . Colon cancer Neg Hx     No Known Allergies  Current Outpatient Prescriptions on File Prior to Visit  Medication Sig Dispense Refill  . amLODipine (NORVASC) 5 MG tablet Take 2.5 mg by mouth daily.       Marland Kitchen aspirin 81 MG tablet Take 81 mg by mouth daily.       . clopidogrel (PLAVIX)  75 MG tablet Take 75 mg by mouth daily.       . flecainide (TAMBOCOR) 50 MG tablet Take 50 mg by mouth 2 (two) times daily.       . fluticasone (FLONASE) 50 MCG/ACT nasal spray Place 2 sprays into the nose daily.      . hydrochlorothiazide (HYDRODIURIL) 25 MG tablet Take 12.5 mg by mouth daily. Pt takes 1/2 tab for 12.5 mg      . metoprolol succinate (TOPROL-XL) 25 MG 24 hr tablet Take 12.5 mg by mouth at bedtime.       Marland Kitchen NASONEX 50 MCG/ACT nasal spray Take 2 sprays by mouth Once daily as needed.      . pantoprazole (PROTONIX) 40 MG tablet Take 1 tablet (40 mg total) by mouth daily.  30 tablet  0  . polyethylene glycol (MIRALAX / GLYCOLAX) packet Take 17 g by mouth daily.      . ramipril (ALTACE) 2.5 MG tablet Take 2.5 mg by mouth daily.        . simvastatin (ZOCOR) 40 MG tablet Take 40 mg by mouth at bedtime.       Marland Kitchen DISCONTD: pantoprazole (PROTONIX) 40 MG tablet Take 40 mg by mouth daily.        Current Facility-Administered Medications on File Prior to Visit  Medication Dose Route Frequency Provider Last Rate Last Dose  . levalbuterol (XOPENEX) nebulizer solution 0.63 mg  0.63 mg Nebulization Once Waymon Budge, MD         patient denies chest pain, shortness of breath, orthopnea. Denies lower extremity edema, abdominal pain, change in appetite, change in bowel movements. Patient denies rashes, musculoskeletal complaints. No other specific complaints in a complete review of systems.   BP 134/78  Pulse 48  Temp(Src) 97.6 F (36.4 C) (Oral)  Wt 188 lb (85.276 kg)  well-developed well-nourished male in no acute distress. HEENT exam atraumatic, normocephalic, neck supple without jugular venous distention. Chest clear to auscultation cardiac exam S1-S2 are regular- bradycardic (40 bpm). Abdominal exam overweight with bowel sounds, soft and nontender.

## 2011-06-29 NOTE — Telephone Encounter (Signed)
Pt was taken off several medications today and is experiencing ringing in his ears and is worried it is a side effect from coming off the medication. Pt requesting to be contacted

## 2011-06-29 NOTE — Assessment & Plan Note (Addendum)
He is bradycardic and rhythm is bigeminy He will f/u with dr. Alanda Amass-- he just wore a holter monitor for two weeks Stay off of metoprolol--at least until f/u with CV

## 2011-06-30 ENCOUNTER — Ambulatory Visit (INDEPENDENT_AMBULATORY_CARE_PROVIDER_SITE_OTHER): Payer: Medicare Other

## 2011-06-30 DIAGNOSIS — J309 Allergic rhinitis, unspecified: Secondary | ICD-10-CM | POA: Diagnosis not present

## 2011-06-30 NOTE — Telephone Encounter (Signed)
Unlikely that he is having a side effects of changing medications.

## 2011-06-30 NOTE — Telephone Encounter (Signed)
Pt aware.

## 2011-07-04 ENCOUNTER — Other Ambulatory Visit: Payer: Self-pay | Admitting: Cardiovascular Disease

## 2011-07-04 ENCOUNTER — Ambulatory Visit
Admission: RE | Admit: 2011-07-04 | Discharge: 2011-07-04 | Disposition: A | Discharge status: Home / Self Care | Payer: Medicare Other | Source: Ambulatory Visit | Attending: Cardiovascular Disease | Admitting: Cardiovascular Disease

## 2011-07-04 DIAGNOSIS — Z01811 Encounter for preprocedural respiratory examination: Secondary | ICD-10-CM | POA: Diagnosis not present

## 2011-07-04 DIAGNOSIS — D71 Functional disorders of polymorphonuclear neutrophils: Secondary | ICD-10-CM | POA: Diagnosis not present

## 2011-07-04 DIAGNOSIS — I4891 Unspecified atrial fibrillation: Secondary | ICD-10-CM | POA: Diagnosis not present

## 2011-07-04 DIAGNOSIS — I495 Sick sinus syndrome: Secondary | ICD-10-CM | POA: Diagnosis not present

## 2011-07-04 DIAGNOSIS — R5383 Other fatigue: Secondary | ICD-10-CM | POA: Diagnosis not present

## 2011-07-04 DIAGNOSIS — Z79899 Other long term (current) drug therapy: Secondary | ICD-10-CM | POA: Diagnosis not present

## 2011-07-04 DIAGNOSIS — D689 Coagulation defect, unspecified: Secondary | ICD-10-CM | POA: Diagnosis not present

## 2011-07-04 DIAGNOSIS — R918 Other nonspecific abnormal finding of lung field: Secondary | ICD-10-CM | POA: Diagnosis not present

## 2011-07-04 DIAGNOSIS — I498 Other specified cardiac arrhythmias: Secondary | ICD-10-CM | POA: Diagnosis not present

## 2011-07-05 ENCOUNTER — Emergency Department (HOSPITAL_COMMUNITY)
Admission: EM | Admit: 2011-07-05 | Discharge: 2011-07-05 | Disposition: A | Discharge status: Home / Self Care | Payer: Medicare Other | Attending: Emergency Medicine | Admitting: Emergency Medicine

## 2011-07-05 ENCOUNTER — Encounter (HOSPITAL_COMMUNITY): Payer: Self-pay | Admitting: Pharmacy Technician

## 2011-07-05 ENCOUNTER — Other Ambulatory Visit: Payer: Self-pay

## 2011-07-05 ENCOUNTER — Encounter (HOSPITAL_COMMUNITY): Payer: Self-pay | Admitting: *Deleted

## 2011-07-05 ENCOUNTER — Emergency Department (HOSPITAL_COMMUNITY): Payer: Medicare Other

## 2011-07-05 DIAGNOSIS — I491 Atrial premature depolarization: Secondary | ICD-10-CM | POA: Diagnosis not present

## 2011-07-05 DIAGNOSIS — R0609 Other forms of dyspnea: Secondary | ICD-10-CM | POA: Diagnosis not present

## 2011-07-05 DIAGNOSIS — I499 Cardiac arrhythmia, unspecified: Secondary | ICD-10-CM | POA: Diagnosis not present

## 2011-07-05 DIAGNOSIS — I252 Old myocardial infarction: Secondary | ICD-10-CM | POA: Insufficient documentation

## 2011-07-05 DIAGNOSIS — Z8546 Personal history of malignant neoplasm of prostate: Secondary | ICD-10-CM | POA: Diagnosis not present

## 2011-07-05 DIAGNOSIS — R0602 Shortness of breath: Secondary | ICD-10-CM | POA: Insufficient documentation

## 2011-07-05 DIAGNOSIS — I10 Essential (primary) hypertension: Secondary | ICD-10-CM | POA: Diagnosis not present

## 2011-07-05 DIAGNOSIS — I2510 Atherosclerotic heart disease of native coronary artery without angina pectoris: Secondary | ICD-10-CM | POA: Insufficient documentation

## 2011-07-05 DIAGNOSIS — I4891 Unspecified atrial fibrillation: Secondary | ICD-10-CM | POA: Diagnosis not present

## 2011-07-05 DIAGNOSIS — C61 Malignant neoplasm of prostate: Secondary | ICD-10-CM | POA: Diagnosis not present

## 2011-07-05 DIAGNOSIS — R0989 Other specified symptoms and signs involving the circulatory and respiratory systems: Secondary | ICD-10-CM | POA: Diagnosis not present

## 2011-07-05 DIAGNOSIS — R0600 Dyspnea, unspecified: Secondary | ICD-10-CM

## 2011-07-05 DIAGNOSIS — I4949 Other premature depolarization: Secondary | ICD-10-CM | POA: Insufficient documentation

## 2011-07-05 DIAGNOSIS — J984 Other disorders of lung: Secondary | ICD-10-CM | POA: Diagnosis not present

## 2011-07-05 DIAGNOSIS — R002 Palpitations: Secondary | ICD-10-CM | POA: Diagnosis not present

## 2011-07-05 DIAGNOSIS — I493 Ventricular premature depolarization: Secondary | ICD-10-CM

## 2011-07-05 LAB — POCT I-STAT TROPONIN I: Troponin i, poc: 0 ng/mL (ref 0.00–0.08)

## 2011-07-05 LAB — BASIC METABOLIC PANEL
Chloride: 97 mEq/L (ref 96–112)
GFR calc non Af Amer: 80 mL/min — ABNORMAL LOW (ref >90)
Glucose, Bld: 88 mg/dL (ref 70–99)
Potassium: 3.2 mEq/L — ABNORMAL LOW (ref 3.5–5.1)
Sodium: 136 mEq/L (ref 135–145)

## 2011-07-05 LAB — CBC
Hemoglobin: 14.2 g/dL (ref 13.0–17.0)
MCH: 28.4 pg (ref 26.0–34.0)
Platelets: 239 10*3/uL (ref 150–400)
RBC: 5 MIL/uL (ref 4.22–5.81)
WBC: 4.1 10*3/uL (ref 4.0–10.5)

## 2011-07-05 LAB — DIFFERENTIAL
Eosinophils Absolute: 0.1 10*3/uL (ref 0.0–0.7)
Lymphocytes Relative: 36 % (ref 12–46)
Lymphs Abs: 1.5 10*3/uL (ref 0.7–4.0)
Neutro Abs: 2.2 10*3/uL (ref 1.7–7.7)
Neutrophils Relative %: 54 % (ref 43–77)

## 2011-07-05 LAB — MAGNESIUM: Magnesium: 2.2 mg/dL (ref 1.5–2.5)

## 2011-07-05 MED ORDER — POTASSIUM CHLORIDE CRYS ER 20 MEQ PO TBCR
40.0000 meq | EXTENDED_RELEASE_TABLET | Freq: Once | ORAL | Status: AC
  Administered 2011-07-05: 40 meq via ORAL
  Filled 2011-07-05: qty 2

## 2011-07-05 MED ORDER — ASPIRIN 81 MG PO CHEW
324.0000 mg | CHEWABLE_TABLET | Freq: Once | ORAL | Status: AC
Start: 2011-07-05 — End: 2011-07-05
  Administered 2011-07-05: 324 mg via ORAL
  Filled 2011-07-05: qty 4

## 2011-07-05 NOTE — ED Provider Notes (Signed)
History     CSN: [id number]  Arrival date & time 07/05/11  1251   First MD Initiated Contact with Patient 07/05/11 1253      Chief Complaint  Patient presents with  . Shortness of Breath     Patient is a 76 y.o. male presenting with palpitations. The history is provided by the patient and the spouse.  Palpitations  This is a new problem. The current episode started 6 to 12 hours ago. Episode frequency: occasionally; every few minutes. Associated with: history of atrial fibrillation. Associated symptoms include shortness of breath. Pertinent negatives include no diaphoresis, no fever, no chest pain, no chest pressure, no near-syncope, no abdominal pain, no nausea, no vomiting, no weakness and no cough. He has tried nothing for the symptoms. Risk factors: history of atrial fibrillation.    Past Medical History  Diagnosis Date  . Alcohol abuse, episodic 05/19/2008  . ALLERGIC RHINITIS 04/15/2009  . COLONIC POLYPS, ADENOMATOUS, HX OF 02/14/2008  . DIVERTICULOSIS, COLON 02/14/2008  . ESOPHAGEAL STRICTURE 01/28/2008  . GERD 02/14/2008  . HYPERGLYCEMIA 11/16/2006  . HYPERLIPIDEMIA 02/14/2008  . INSOMNIA-SLEEP DISORDER-UNSPEC 02/11/2009  . MYOCARDIAL INFARCTION, HX OF 11/16/2006  . 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 PROSTATE CANCER, HX OF 02/25/2000  . TRANSIENT ISCHEMIC ATTACKS, HX OF 11/16/2006  . Arthritis   . Cancer 2002  . Atrial fibrillation 11/16/2006    remote CHF related to atrial fib with rapid ventricular response over 25 yrs per office note,  . ASTHMA 11/16/2006    sinusitis-    hx ?yeast patch/white patch on vocal cord as per [facility] 02/25/11-   . OBSTRUCTIVE SLEEP APNEA 11/10/2008  . SLEEP APNEA 10/03/2009    LOV Dr Maple Hudson 12/12 in [facility]    Moderate per patient- settings ?6/last sleep study years ago  . HYPERTENSION 11/16/2006  . CORONARY ARTERY DISEASE 11/16/2006    nonischemic cardiomyapathy, last stress test 2011- LOV with clearance note Dr  Alanda Amass and EKG 1/13 on chart      Past Surgical History  Procedure Date  . Tonsilectomy, adenoidectomy, bilateral myringotomy and tubes   . Knee arthroscopy     2 surgeries right and 1 left  . Prostate surgery 2/02    prostate cancer  . Uvulopalatopharyngoplasty   . Cardiac catheterization   . Tonsillectomy   . Esophagogastroduodenoscopy     with dilitation  . Lumbar laminectomy/decompression microdiscectomy 03/02/2011    Procedure: LUMBAR LAMINECTOMY/DECOMPRESSION MICRODISCECTOMY;  Surgeon: Javier Docker, MD;  Location: [facility];  Service: Orthopedics;  Laterality: N/A;  Decompression Lumbar 4 - Lumbar(X-Ray)    Family History  Problem Relation Age of Onset  . Heart attack Father   . Heart attack Brother   . Heart disease Maternal Uncle   . Heart attack Maternal Uncle   . Colon cancer Neg Hx     History  Substance Use Topics  . Smoking status: Former Smoker    Types: Cigarettes    Quit date: 03/22/1964  . Smokeless tobacco: Former Neurosurgeon    Quit date: 05/10/1964  . Alcohol Use: 8.4 oz/week    14 Shots of liquor per week     2 mixed drinks daily      Review of Systems  Constitutional: Negative for fever, chills and diaphoresis.  HENT: Negative for neck pain.   Respiratory: Positive for shortness of breath. Negative for cough, chest tightness and wheezing.   Cardiovascular: Positive for palpitations. Negative for chest pain, leg swelling and near-syncope.  Gastrointestinal: Negative for  nausea, vomiting, abdominal pain, diarrhea and constipation.  Skin: Negative for rash and wound.  Neurological: Negative for seizures, syncope and weakness.  All other systems reviewed and are negative.    Allergies  Review of patient's allergies indicates no known allergies.  Home Medications   Current Outpatient Rx  Name Route Sig Dispense Refill  . ACETAMINOPHEN 325 MG PO TABS Oral Take 650 mg by mouth every 6 (six) hours as needed. For pain    . AMLODIPINE BESYLATE 5 MG PO TABS Oral Take 2.5 mg by mouth  daily.     . ASPIRIN 81 MG PO TABS Oral Take 81 mg by mouth daily.     Marland Kitchen CLOPIDOGREL BISULFATE 75 MG PO TABS Oral Take 75 mg by mouth daily.     Marland Kitchen FLECAINIDE ACETATE 50 MG PO TABS Oral Take 50 mg by mouth 2 (two) times daily.     Marland Kitchen HYDROCHLOROTHIAZIDE 25 MG PO TABS Oral Take 12.5 mg by mouth daily.     Marland Kitchen NASONEX 50 MCG/ACT NA SUSP Each Nare Place 1 spray into both nostrils Once daily as needed. For allergies    . PANTOPRAZOLE SODIUM 40 MG PO TBEC Oral Take 1 tablet (40 mg total) by mouth daily. 30 tablet 0    MUST KEEP APPOINTMENT FOR FURTHER REFILLS!  . POLYETHYLENE GLYCOL 3350 PO PACK Oral Take 17 g by mouth daily as needed. For constipation    . RAMIPRIL 2.5 MG PO TABS Oral Take 2.5 mg by mouth daily.     Marland Kitchen SIMVASTATIN 40 MG PO TABS Oral Take 40 mg by mouth at bedtime.     Marland Kitchen ZALEPLON 10 MG PO CAPS Oral Take 10-20 mg by mouth at bedtime. May take up to 2 tablets    . METOPROLOL SUCCINATE ER 25 MG PO TB24 Oral Take 25 mg by mouth Daily.       BP 140/79  Pulse 56  Temp(Src) 98.1 F (36.7 C) (Oral)  Resp 21  SpO2 99%  Physical Exam  Nursing note and vitals reviewed. Constitutional: He is oriented to person, place, and time. He appears well-developed and well-nourished.  HENT:  Head: Normocephalic and atraumatic.  Right Ear: External ear normal.  Left Ear: External ear normal.  Nose: Nose normal.  Mouth/Throat: Oropharynx is clear and moist. No oropharyngeal exudate.  Eyes: Conjunctivae are normal. Pupils are equal, round, and reactive to light.  Neck: Normal range of motion. Neck supple.  Cardiovascular: Normal rate, regular rhythm, normal heart sounds and intact distal pulses.   Pulmonary/Chest: Effort normal and breath sounds normal. No respiratory distress. He has no wheezes. He has no rales. He exhibits no tenderness.  Abdominal: Soft. Bowel sounds are normal. He exhibits no distension and no mass. There is no tenderness. There is no rebound and no guarding.  Musculoskeletal:  Normal range of motion. He exhibits no edema and no tenderness.  Neurological: He is alert and oriented to person, place, and time. No cranial nerve deficit. Coordination normal.  Skin: Skin is warm and dry. No rash noted. No erythema. No pallor.  Psychiatric: He has a normal mood and affect. His behavior is normal. Judgment and thought content normal.    ED Course  Procedures (including critical care time)  Labs Reviewed  BASIC METABOLIC PANEL - Abnormal; Notable for the following:    Potassium 3.2 (*)    GFR calc non Af Amer 80 (*)    All other components within normal limits  PRO B NATRIURETIC PEPTIDE -  Abnormal; Notable for the following:    Pro B Natriuretic peptide (BNP) 164.9 (*)    All other components within normal limits  CBC  DIFFERENTIAL  POCT I-STAT TROPONIN I  MAGNESIUM  POCT I-STAT TROPONIN I   Dg Chest 2 View  07/05/2011  *RADIOLOGY REPORT*  Clinical Data: Shortness of breath.  Atrial fibrillation.  Previous myocardial infarct.  Prostate cancer.  CHEST - 2 VIEW  Comparison: 07/04/2011  Findings: Heart size is within normal limits.  Both lungs are clear.  No evidence of pleural effusion.  No mass or lymphadenopathy identified.  Tiny calcified granulomata are again seen in the left mid lung.  IMPRESSION: Stable exam.  No active disease.  Original Report Authenticated By: Danae Orleans, M.D.   Dg Chest 2 View  07/04/2011  *RADIOLOGY REPORT*  Clinical Data: Preop for placement of pacemaker, shortness of breath  CHEST - 2 VIEW  Comparison: Chest x-ray of 02/28/2011 and CT chest of 03/07/2011  Findings: No active infiltrate or effusion is seen. A small cluster of nodular opacities in the left mid lung appears stable and most likely due to prior granulomatous disease, with stable calcified left hilar nodes as well.  The heart is within normal limits in size no bony abnormality is seen.  IMPRESSION: Stable chest x-ray.  Prior granulomatous disease.  No active  Original Report  Authenticated By: Juline Patch, M.D.     1. Premature ventricular beats   2. Dyspnea      Date: 07/05/2011  Rate: 57 bpm  Rhythm: sinus bradycardia  QRS Axis: normal  Intervals: normal  ST/T Wave abnormalities: nonspecific ST changes  Conduction Disutrbances: left bundle branch block  Narrative Interpretation: No evidence of acute ischemia or arrythmia  Old EKG Reviewed: Unchanged (05/08/09)    MDM  76 yo M w/hx of paroxysmal atrial fibrillation and congestive heart failure, scheduled for pacemaker placement 07/08/11, presents for dyspnea and palpitations. Denies chest pain at any point, but patient is concerned that he is in atrial fibrillation. AFVSS. EKG without evidence of ischemic ischemia or arrythmia. Patient monitored on telemetry for 4 hrs and always in NSR the entire time. Pt with symptomatic PVC's. Work-up negative (including serial troponins) except for mild hypokalemia; treated with PO KCl. Discussed case with [facility]; pt scheduled for pacemaker placement, which should not change based on these symptomatic PVC's. Patient given return precautions, including worsening of signs or symptoms. Patient instructed to follow-up with primary care physician.          Clemetine Marker, MD 07/05/11 [phone]

## 2011-07-05 NOTE — ED Notes (Signed)
States has had palpitations, SOB x 1 week. Denies CP. No pedal eddema, denies cold, cough. States palpitations feel much better now. Is scheduled for pacemaker placement this Friday

## 2011-07-05 NOTE — ED Provider Notes (Signed)
I saw and evaluated the patient, reviewed the resident's note and I agree with the findings and plan. Pt presents with palpitations and shortness of breath.  In the ED he is currently not in a fib.  Will check labs, xrays.  Will discuss with cardiologist once evaluation complete.   Rate: 57  Rhythm:  sinus rhythm, bradycardia  QRS Axis: normal  Intervals: incomplete lbbb  ST/T Wave abnormalities: st depressions  Conduction Disutrbances:none  Narrative Interpretation: abnormal  Old EKG Reviewed: LBBB no longer present   Celene Kras, MD 07/06/11 0740

## 2011-07-05 NOTE — Discharge Instructions (Signed)
Cardiac Arrhythmia Your heart is a muscle that works to pump blood through your body by regular contractions. The beating of your heart is controlled by a system of special pacemaker cells. These cells control the electrical activity of the heart. When the system controlling this regular beating is disturbed, a heart rhythm abnormality (arrhythmia) results. WHEN YOUR HEART SKIPS A BEAT One of the most common and least serious heart arrhythmias is called an ectopic or premature atrial heartbeat (PAC). This may be noticed as a small change in your regular pulse. A PAC originates from the top part (atrium) of the heart. Within the right atrium, the SA node is the area that normally controls the regularity of the heart. PACs occur in heart tissue outside of the SA node region. You may feel this as a skipped beat or heart flutter, especially if several occur in succession or occur frequently.  Another arrhythmia is ventricular premature complex (VCP or PVC). These extra beats start out in the bottom, more muscular chambers of the heart. In most cases a PVC is harmless. If there are underlying causes that are making the heart irritable such as an overactive thyroid or a prior heart attack PVCs may be of more concern. In a few cases, medications to control the heart rhythm may be prescribed. Things to try at home:  Cut down or avoid alcohol, tobacco and caffeine.   Get enough sleep.   Reduce stress.   Exercise more.  WHEN THE HEART BEATS TOO FAST Atrial tachycardia is a fast heart rate, which starts out in the atrium. It may last from minutes to much longer. Your heart may beat 140 to 240 times per minute instead of the normal 60 to 100.  Symptoms include a worried feeling (anxiety) and a sense that your heart is beating fast and hard.   You may be able to stop the fast rate by holding your breath or bearing down as if you were going to have a bowel movement.   This type of fast rate is usually not  dangerous.  Atrial fibrillation and atrial flutter are other fast rhythms that start in the atria. Both conditions keep the atria from filling with enough blood so the heart does not work well.  Symptoms include feeling light-headed or faint.   These fast rates may be the result of heart damage or disease. Too much thyroid hormone may play a role.   There may be no clear cause or it may be from heart disease or damage.   Medication or a special electrical treatment (cardioversion) may be needed to get the heart beating normally.  Ventricular tachycardia is a fast heart rate that starts in the lower muscular chambers (ventricles) This is a serious disorder that requires treatment as soon as possible. You need someone else to get and use a small defibrillator.  Symptoms include collapse, chest pain, or being short of breath.   Treatment may include medication, procedures to improve blood flow to the heart, or an implantable cardiac defibrillator (ICD).  DIAGNOSIS   A cardiogram (EKG or ECG) will be done to see the arrhythmia, as well as lab tests to check the underlying cause.   If the extra beats or fast rate come and go, you may wear a Holter monitor that records your heart rate for a longer period of time.  SEEK MEDICAL CARE IF:  You have irregular or fast heartbeats (palpitations).   You experience skipped beats.   You develop lightheadedness.  You have chest discomfort.   You have shortness of breath.   You have more frequent episodes, if you are already being treated.  SEEK IMMEDIATE MEDICAL CARE IF:   You have severe chest pain, especially if the pain is crushing or pressure-like and spreads to the arms, back, neck, or jaw, or if you have sweating, feeling sick to your stomach (nausea), or shortness of breath. THIS IS AN EMERGENCY. Do not wait to see if the pain will go away. Get medical help at once. Call 911 or 0 (operator). DO NOT drive yourself to the hospital.   You  feel dizzy or faint.   You have episodes of previously documented atrial tachycardia that do not resolve with the techniques your caregiver has taught you.   Irregular or rapid heartbeats begin to occur more often than in the past, especially if they are associated with more pronounced symptoms or of longer duration.  Document Released: 01/10/2005 Document Revised: 12/30/2010 Document Reviewed: 08/29/2007 [facility] Patient Information 2012 [place], [place].Dyspnea Shortness of breath (dyspnea) is the feeling of uneasy breathing. Dyspnea should be evaluated promptly. DIAGNOSIS  Many tests may be done to find why you are having shortness of breath. Tests may include:  A chest X-ray.   A lung function test.   Blood tests.   Recordings of the electrical activity of the heart (electrocardiogram).   Exercise testing.   Sound wave images of the heart (a cardiac echocardiogram).   A scan.  A cause for your shortness of breath may not be identified initially. In this case, it is important to have a follow-up exam with your caregiver. HOME CARE INSTRUCTIONS   Do not smoke. Smoking is a common cause of shortness of breath. Ask for help to stop smoking.   Avoid being around chemicals that may bother your breathing, such as paint fumes or dust.   Rest as needed. Slowly begin your usual activities.   If medications were prescribed, take them as directed for the full length of time directed. This includes oxygen and any inhaled medications, if prescribed.   It is very important that you follow up with your caregiver or other physician as directed. Waiting to do so or failure to follow up could result in worsening of your condition, possible disability, or death.   Be sure you understand what to do or who to call if your shortness of breath worsens.  SEEK MEDICAL CARE IF:   Your condition does not improve in the time expected.   You have a hard time doing your normal activities even with  rest.   You have any side effects from or problems with medications prescribed.  SEEK IMMEDIATE MEDICAL CARE IF:   You feel your shortness of breath is getting worse.   You feel lightheaded, faint or develop a cough not controlled with medications.   You start coughing up blood.   You get pain with breathing.   You get chest pain or pain in your arms, shoulders or belly (abdomen).   You have a fever.   You are unable to walk up stairs or exercise the way you normally can.  MAKE SURE YOU:   Understand these instructions.   Will watch your condition.   Will get help right away if you are not doing well or get worse.  Document Released: 02/18/2004 Document Revised: 09/22/2010 Document Reviewed: 05/28/2009 [facility] Patient Information 2012 [place], [place].

## 2011-07-05 NOTE — ED Notes (Signed)
C/o SOB, weakness  "heart skipping beats" x 2 days. Informed by cardiologist to go to ED.  Denies CP

## 2011-07-06 ENCOUNTER — Other Ambulatory Visit: Payer: Self-pay | Admitting: Cardiovascular Disease

## 2011-07-08 ENCOUNTER — Ambulatory Visit (HOSPITAL_COMMUNITY)
Admission: RE | Admit: 2011-07-08 | Discharge: 2011-07-09 | Disposition: A | Discharge status: Home / Self Care | Payer: Medicare Other | Source: Ambulatory Visit | Attending: Cardiovascular Disease | Admitting: Cardiovascular Disease

## 2011-07-08 ENCOUNTER — Encounter (HOSPITAL_COMMUNITY): Payer: Self-pay | Admitting: *Deleted

## 2011-07-08 ENCOUNTER — Encounter (HOSPITAL_COMMUNITY)
Admission: RE | Disposition: A | Discharge status: Home / Self Care | Payer: Self-pay | Source: Ambulatory Visit | Attending: Cardiovascular Disease

## 2011-07-08 DIAGNOSIS — I4891 Unspecified atrial fibrillation: Secondary | ICD-10-CM | POA: Diagnosis not present

## 2011-07-08 DIAGNOSIS — R001 Bradycardia, unspecified: Secondary | ICD-10-CM

## 2011-07-08 DIAGNOSIS — I10 Essential (primary) hypertension: Secondary | ICD-10-CM | POA: Diagnosis present

## 2011-07-08 DIAGNOSIS — I495 Sick sinus syndrome: Secondary | ICD-10-CM | POA: Insufficient documentation

## 2011-07-08 DIAGNOSIS — I2510 Atherosclerotic heart disease of native coronary artery without angina pectoris: Secondary | ICD-10-CM | POA: Diagnosis present

## 2011-07-08 DIAGNOSIS — G4733 Obstructive sleep apnea (adult) (pediatric): Secondary | ICD-10-CM | POA: Diagnosis present

## 2011-07-08 DIAGNOSIS — I498 Other specified cardiac arrhythmias: Secondary | ICD-10-CM | POA: Diagnosis not present

## 2011-07-08 HISTORY — DX: Bradycardia, unspecified: R00.1

## 2011-07-08 HISTORY — PX: PERMANENT PACEMAKER INSERTION: SHX5480

## 2011-07-08 SURGERY — PERMANENT PACEMAKER INSERTION
Anesthesia: LOCAL | Laterality: Left

## 2011-07-08 MED ORDER — HYDROCHLOROTHIAZIDE 25 MG PO TABS
12.5000 mg | ORAL_TABLET | Freq: Every day | ORAL | Status: DC
  Filled 2011-07-08: qty 0.5

## 2011-07-08 MED ORDER — AMLODIPINE BESYLATE 2.5 MG PO TABS
2.5000 mg | ORAL_TABLET | Freq: Every day | ORAL | Status: DC
  Administered 2011-07-09: 2.5 mg via ORAL
  Filled 2011-07-08: qty 1

## 2011-07-08 MED ORDER — CLOPIDOGREL BISULFATE 75 MG PO TABS
75.0000 mg | ORAL_TABLET | Freq: Every day | ORAL | Status: DC
  Administered 2011-07-08 – 2011-07-09 (×2): 75 mg via ORAL
  Filled 2011-07-08 (×2): qty 1

## 2011-07-08 MED ORDER — METOPROLOL SUCCINATE ER 25 MG PO TB24
25.0000 mg | ORAL_TABLET | Freq: Every day | ORAL | Status: DC
  Administered 2011-07-09: 25 mg via ORAL
  Filled 2011-07-08 (×2): qty 1

## 2011-07-08 MED ORDER — PANTOPRAZOLE SODIUM 40 MG PO TBEC
40.0000 mg | DELAYED_RELEASE_TABLET | Freq: Every day | ORAL | Status: DC
  Administered 2011-07-09: 40 mg via ORAL
  Filled 2011-07-08 (×2): qty 1

## 2011-07-08 MED ORDER — FLECAINIDE ACETATE 50 MG PO TABS
50.0000 mg | ORAL_TABLET | Freq: Two times a day (BID) | ORAL | Status: DC
Start: 2011-07-08 — End: 2011-07-09
  Administered 2011-07-08 – 2011-07-09 (×2): 50 mg via ORAL
  Filled 2011-07-08 (×3): qty 1

## 2011-07-08 MED ORDER — CEFAZOLIN SODIUM 1-5 GM-% IV SOLN
1.0000 g | Freq: Four times a day (QID) | INTRAVENOUS | Status: AC
  Administered 2011-07-08 – 2011-07-09 (×3): 1 g via INTRAVENOUS
  Filled 2011-07-08 (×3): qty 50

## 2011-07-08 MED ORDER — ASPIRIN 81 MG PO TABS: 81.0000 mg | ORAL_TABLET | Freq: Every day | ORAL | Status: DC

## 2011-07-08 MED ORDER — SIMVASTATIN 40 MG PO TABS: 40.0000 mg | ORAL_TABLET | Freq: Every day | ORAL | Status: DC

## 2011-07-08 MED ORDER — SODIUM CHLORIDE 0.9 % IV SOLN: 250.0000 mL | INTRAVENOUS | Status: DC

## 2011-07-08 MED ORDER — ONDANSETRON HCL 4 MG/2ML IJ SOLN: 4.0000 mg | Freq: Four times a day (QID) | INTRAMUSCULAR | Status: DC | PRN

## 2011-07-08 MED ORDER — CHLORHEXIDINE GLUCONATE 4 % EX LIQD: 60.0000 mL | Freq: Once | CUTANEOUS | Status: DC

## 2011-07-08 MED ORDER — LIDOCAINE HCL (PF) 1 % IJ SOLN
INTRAMUSCULAR | Status: AC
  Filled 2011-07-08: qty 60

## 2011-07-08 MED ORDER — MIDAZOLAM HCL 2 MG/2ML IJ SOLN
INTRAMUSCULAR | Status: AC
  Filled 2011-07-08: qty 2

## 2011-07-08 MED ORDER — RAMIPRIL 2.5 MG PO TABS: 2.5000 mg | ORAL_TABLET | Freq: Every day | ORAL | Status: DC

## 2011-07-08 MED ORDER — SODIUM CHLORIDE 0.9 % IJ SOLN: 3.0000 mL | Freq: Two times a day (BID) | INTRAMUSCULAR | Status: DC

## 2011-07-08 MED ORDER — MUPIROCIN 2 % EX OINT: TOPICAL_OINTMENT | Freq: Two times a day (BID) | CUTANEOUS | Status: DC

## 2011-07-08 MED ORDER — SODIUM CHLORIDE 0.9 % IJ SOLN: 3.0000 mL | INTRAMUSCULAR | Status: DC | PRN

## 2011-07-08 MED ORDER — ACETAMINOPHEN 325 MG PO TABS: 325.0000 mg | ORAL_TABLET | ORAL | Status: DC | PRN

## 2011-07-08 MED ORDER — CEFAZOLIN SODIUM-DEXTROSE 2-3 GM-% IV SOLR
2.0000 g | INTRAVENOUS | Status: DC
  Administered 2011-07-08: 2 g via INTRAVENOUS
  Filled 2011-07-08: qty 50

## 2011-07-08 MED ORDER — ASPIRIN 81 MG PO CHEW
81.0000 mg | CHEWABLE_TABLET | Freq: Every day | ORAL | Status: DC
Start: 2011-07-09 — End: 2011-07-09
  Administered 2011-07-09: 81 mg via ORAL
  Filled 2011-07-08 (×2): qty 1

## 2011-07-08 MED ORDER — HYDROCODONE-ACETAMINOPHEN 5-325 MG PO TABS
1.0000 | ORAL_TABLET | ORAL | Status: DC | PRN
  Administered 2011-07-08 (×2): 2 via ORAL
  Filled 2011-07-08 (×2): qty 2

## 2011-07-08 MED ORDER — ZOLPIDEM TARTRATE 5 MG PO TABS
5.0000 mg | ORAL_TABLET | Freq: Every evening | ORAL | Status: DC | PRN
  Administered 2011-07-08: 5 mg via ORAL
  Filled 2011-07-08: qty 1

## 2011-07-08 MED ORDER — ATORVASTATIN CALCIUM 20 MG PO TABS
20.0000 mg | ORAL_TABLET | Freq: Every day | ORAL | Status: DC
  Administered 2011-07-08: 20 mg via ORAL
  Filled 2011-07-08 (×2): qty 1

## 2011-07-08 MED ORDER — HEPARIN (PORCINE) IN NACL 2-0.9 UNIT/ML-% IJ SOLN
INTRAMUSCULAR | Status: AC
  Filled 2011-07-08: qty 1000

## 2011-07-08 MED ORDER — MUPIROCIN 2 % EX OINT
TOPICAL_OINTMENT | CUTANEOUS | Status: AC
  Administered 2011-07-08: 1 via NASAL
  Filled 2011-07-08: qty 22

## 2011-07-08 MED ORDER — SODIUM CHLORIDE 0.9 % IV SOLN
INTRAVENOUS | Status: AC
  Administered 2011-07-08: 11:00:00 via INTRAVENOUS

## 2011-07-08 MED ORDER — FENTANYL CITRATE 0.05 MG/ML IJ SOLN
INTRAMUSCULAR | Status: AC
  Filled 2011-07-08: qty 2

## 2011-07-08 MED ORDER — SODIUM CHLORIDE 0.45 % IV SOLN: INTRAVENOUS | Status: DC

## 2011-07-08 MED ORDER — SODIUM CHLORIDE 0.9 % IR SOLN
80.0000 mg | Status: DC
  Administered 2011-07-08: 80 mg
  Filled 2011-07-08: qty 2

## 2011-07-08 NOTE — CV Procedure (Signed)
Sasuke, Yaffe Male, 76 y.o., 08/20/1935  Location: [facility]  MRN: [id number]  CSN: [id number]  Procedure report  Procedure performed:  1. Implantation of new dual chamber permanent pacemaker 2. Fluoroscopy 3. Light sedation    Reason for procedure: Symptomatic bradycardia due to: Sinus node dysfunction Tachycardia-bradycardia syndrome Bradycardia due to necessary medications Paroxysmal atrial fibrillation  Procedure performed by: Thurmon Fair, MD  Complications: None  Estimated blood loss: <10 mL  Medications administered during procedure:  Ancef 2 g intravenously Lidocaine 1% 30 mL locally,  Fentanyl 150 mcg intravenously Versed 5 mg intravenously  Device details:  [employer]. model K064DREL serial number [id number] Right atrial lead Guidant dextrus 4137 serial number [id number] Right ventricular lead Guidant dextrus 4137 serial number [id number]  Procedure details:  After the risks and benefits of the procedure were discussed the patient provided informed consent and was brought to the cardiac cath lab in the fasting state. The patient was prepped and draped in usual sterile fashion. Local anesthesia with 1% lidocaine was administered to to the left infraclavicular area. A 5-6 cm horizontal incision was made parallel with and 2-3 cm caudal to the left clavicle. Using electrocautery and blunt dissection a prepectoral pocket was created down to the level of the pectoralis major muscle fascia. The pocket was carefully inspected for hemostasis. An antibiotic-soaked sponge was placed in the pocket.  Under fluoroscopic guidance and using the modified Seldinger technique 2 separate venipunctures were performed to access the left subclavian vein. minimal difficulty was encountered accessing the vein.  Two J-tip guidewires were subsequently exchanged for two 7 French safe sheaths.  Under fluoroscopic guidance the ventricular lead was advanced to  level of the mid to apical right ventricular septum and thet active-fixation helix was deployed. Prominent current of injury was seen. Satisfactory pacing and sensing parameters were recorded. There was no evidence of diaphragmatic stimulation at maximum device output. The safe sheath was peeled away and the lead was secured in place with 2-0 silk.  In similar fashion the right atrial lead was advanced to the level of the atrial appendage. The active-fixation helix was deployed. There was prominent current of injury. Satisfactory  pacing and sensing parameters were recorded. There was no evidence of diaphragmatic stimulation with pacing at maximum device output. The safe sheath was peeled away and the lead was secured in place with 2-0 silk.  The antibiotic-soaked sponge was removed from the pocket. The pocket was flushed with copious amounts of antibiotic solution. Reinspection showed excellent hemostasis..  The ventricular lead was connected to the generator and appropriate ventricular pacing was seen. Subsequently the atrial lead was also connected. Repeat testing of the lead parameters later showed excellent values.  The entire system was then carefully inserted in the pocket with care been taking that the leads and device assumed a comfortable position without pressure on the incision. Great care was taken that the leads be located deep to the generator. The pocket was then closed in layers using 2 layers of 2-0 Vicryl and cutaneous staples, after which a sterile dressing was applied.  At the end of the procedure the following lead parameters were encountered:  Right atrial lead  sensed P waves 1.9 mV, impedance 756 ohms, threshold 1.6 V at 0.5 ms pulse width.  Right ventricular lead sensed R waves 16.9 mV, impedance 744ohms, threshold 0.7 V at 0.5 ms pulse width.  Thurmon Fair, MD, [facility] [phone] office [phone] pager 07/08/2011 10:17  AM  Cc: Birdie Sons, MD

## 2011-07-08 NOTE — H&P (Addendum)
Date of Initial H&P:  76 year old with fatigue and dyspnea attributable to sinus node dysfunction/chronotropic incompetence. History reviewed, patient examined, no change in status, stable for surgery. Thurmon Fair, MD, [facility] [phone] office [phone] pager 07/08/2011 8:24 AM

## 2011-07-09 ENCOUNTER — Ambulatory Visit (HOSPITAL_COMMUNITY): Payer: Medicare Other

## 2011-07-09 DIAGNOSIS — Z950 Presence of cardiac pacemaker: Secondary | ICD-10-CM | POA: Diagnosis not present

## 2011-07-09 DIAGNOSIS — I495 Sick sinus syndrome: Secondary | ICD-10-CM | POA: Diagnosis not present

## 2011-07-09 DIAGNOSIS — I4891 Unspecified atrial fibrillation: Secondary | ICD-10-CM | POA: Diagnosis not present

## 2011-07-09 LAB — BASIC METABOLIC PANEL
BUN: 14 mg/dL (ref 6–23)
Calcium: 9 mg/dL (ref 8.4–10.5)
Creatinine, Ser: 1 mg/dL (ref 0.50–1.35)
GFR calc non Af Amer: 73 mL/min — ABNORMAL LOW (ref >90)
Glucose, Bld: 95 mg/dL (ref 70–99)

## 2011-07-09 MED ORDER — HYDROCHLOROTHIAZIDE 12.5 MG PO CAPS
12.5000 mg | ORAL_CAPSULE | Freq: Every day | ORAL | Status: DC
  Administered 2011-07-09: 12.5 mg via ORAL
  Filled 2011-07-09: qty 1

## 2011-07-09 MED ORDER — METOPROLOL SUCCINATE ER 25 MG PO TB24: 50.0000 mg | ORAL_TABLET | Freq: Every day | ORAL | Status: DC

## 2011-07-09 MED ORDER — POTASSIUM CHLORIDE ER 10 MEQ PO TBCR: 20.0000 meq | EXTENDED_RELEASE_TABLET | Freq: Every day | ORAL | Status: DC

## 2011-07-09 MED ORDER — POTASSIUM CHLORIDE CRYS ER 20 MEQ PO TBCR
EXTENDED_RELEASE_TABLET | ORAL | Status: AC
  Administered 2011-07-09: 20 meq via ORAL
  Filled 2011-07-09: qty 1

## 2011-07-09 MED ORDER — METOPROLOL SUCCINATE ER 25 MG PO TB24: 25.0000 mg | ORAL_TABLET | Freq: Every day | ORAL | Status: DC

## 2011-07-09 MED ORDER — POTASSIUM CHLORIDE CRYS ER 20 MEQ PO TBCR
20.0000 meq | EXTENDED_RELEASE_TABLET | Freq: Once | ORAL | Status: AC
  Administered 2011-07-09: 20 meq via ORAL

## 2011-07-09 MED ORDER — HYDROCODONE-ACETAMINOPHEN 5-325 MG PO TABS: 1.0000 | ORAL_TABLET | ORAL | Status: DC | PRN

## 2011-07-09 NOTE — Progress Notes (Addendum)
Subjective: No complaints  Objective: Vital signs in last 24 hours: Temp:  [97.7 F (36.5 C)-98.6 F (37 C)] 98.6 F (37 C) (06/15 0425) Pulse Rate:  [51-100] 76  (06/15 0425) Resp:  [12-24] 19  (06/15 0425) BP: (108-147)/(61-97) 146/80 mmHg (06/15 0425) SpO2:  [94 %-100 %] 100 % (06/15 0425) Weight:  [82.3 kg (181 lb 7 oz)] 82.3 kg (181 lb 7 oz) (06/15 0425) Weight change:    Intake/Output from previous day: -1586 06/14 0701 - 06/15 0700 In: 340 [P.O.:240; IV Piggyback:100] Out: 1926 [Urine:1925; Stool:1] Intake/Output this shift:    General appearance: alert, cooperative and no distress Neck: no adenopathy, no carotid bruit, no JVD, supple, symmetrical, trachea midline and thyroid not enlarged, symmetric, no tenderness/mass/nodules Lungs: clear to auscultation bilaterally Heart: regular rate and rhythm, S1, S2 normal, no murmur, click, rub or gallop Abdomen: soft, non-tender; bowel sounds normal; no masses,  no organomegaly Extremities: extremities normal, atraumatic, no cyanosis or edema Pulses: 2+ and symmetric Skin: Skin color, texture, turgor normal. No rashes or lesions Neurologic: Alert and oriented X 3, normal strength and tone. Normal symmetric reflexes. Normal coordination and gait    Lab Results: No results found for this basename: WBC:2,HGB:2,HCT:2,PLT:2 in the last 72 hours BMET  [facility] 07/09/11 0415  NA 137  K 3.4*  CL 99  CO2 29  GLUCOSE 95  BUN 14  CREATININE 1.00  CALCIUM 9.0   No results found for this basename: TROPONINI:2,CK,MB:2 in the last 72 hours  Lab Results  Component Value Date   CHOL 136 09/02/2010   HDL 55.10 09/02/2010   LDLCALC 61 09/02/2010   TRIG 100.0 09/02/2010   CHOLHDL 2 09/02/2010   Lab Results  Component Value Date   HGBA1C 6.0 09/02/2010     Lab Results  Component Value Date   TSH 1.17 03/14/2011    Hepatic Function Panel No results found for this basename: PROT,ALBUMIN,AST,ALT,ALKPHOS,BILITOT,BILIDIR,IBILI in the last  72 hours No results found for this basename: CHOL in the last 72 hours No results found for this basename: PROTIME in the last 72 hours    EKG: Orders placed during the hospital encounter of 07/08/11  . EKG 12-LEAD  . EKG 12-LEAD  . EKG 12-LEAD  . EKG 12-LEAD    Studies/Results: Dg Chest 2 View  07/09/2011  *RADIOLOGY REPORT*  Clinical Data: Status post pacer placement  CHEST - 2 VIEW  Comparison: 07/05/2011  Findings: The left chest wall pacer device is identified with leads in the right atrial appendage and right ventricle.  Heart size is normal.  No pleural effusion or pulmonary edema.  No pneumothorax identified.  Calcified left hilar lymph nodes and left lung granulomas again noted.  IMPRESSION:  1.  No complications after left chest wall pacer placement.  Original Report Authenticated By: Rosealee Albee, M.D.    Medications: I have reviewed the patient's current medications.    Marland Kitchen amLODipine  2.5 mg Oral Daily  . aspirin  81 mg Oral Daily  . atorvastatin  20 mg Oral QHS  .  ceFAZolin (ANCEF) IV  1 g Intravenous Q6H  . clopidogrel  75 mg Oral Daily  . fentaNYL      . fentaNYL      . flecainide  50 mg Oral BID  . heparin      . hydrochlorothiazide  12.5 mg Oral Daily  . lidocaine      . metoprolol succinate  25 mg Oral Daily  . midazolam      .  midazolam      . midazolam      . pantoprazole  40 mg Oral Daily  . sodium chloride  3 mL Intravenous Q12H  . DISCONTD: aspirin  81 mg Oral Daily  . DISCONTD:  ceFAZolin (ANCEF) IV  2 g Intravenous On Call  . DISCONTD: chlorhexidine  60 mL Topical Once  . DISCONTD: gentamicin irrigation  80 mg Irrigation On Call  . DISCONTD: hydrochlorothiazide  12.5 mg Oral Daily  . DISCONTD: mupirocin ointment   Nasal BID  . DISCONTD: ramipril  2.5 mg Oral Daily  . DISCONTD: simvastatin  40 mg Oral QHS   Assessment/Plan: Patient Active Problem List  Diagnosis  . OTHER TESTICULAR HYPOFUNCTION  . HYPERLIPIDEMIA  . ALCOHOL ABUSE,  EPISODIC  . OBSTRUCTIVE SLEEP APNEA  . HYPERTENSION  . MYOCARDIAL INFARCTION, HX OF  . CORONARY ARTERY DISEASE  . ATRIAL FIBRILLATION  . ALLERGIC RHINITIS  . Allergic asthma  . ESOPHAGEAL STRICTURE  . GERD  . DIVERTICULOSIS, COLON  . INSOMNIA-SLEEP DISORDER-UNSPEC  . HYPERGLYCEMIA  . PROSTATE CANCER, HX OF  . TRANSIENT ISCHEMIC ATTACKS, HX OF  . COLONIC POLYPS, ADENOMATOUS, HX OF  . Lumbar spinal stenosis  . Vocal cord nodule  . Anxiety  . Symptomatic bradycardia, secondary to sinus node dysfunction  . Tachy-brady syndrome  . S/P cardiac pacemaker procedure, 07/08/11, [employer]   PLAN: Day 1 post pacemaker. K+ slightly low will replace. 2 view cxr no pneumo. EKG v. Pacing at 100. ?metoprolol held yesterday. ? D/c home today.   LOS: 1 day   INGOLD,LAURA R 07/09/2011, 8:09 AM   I have seen and examined the patient along with [facility] R, NP.  I have reviewed the chart, notes and new data.  I agree with NP's note.  Key new complaints: minimally sore at surgical site Key examination changes: slight staining of dressing, no hematoma at pacemaker site Key new findings / data: K  3.4 Good pacemaker lead parameters. CXR OK Frequent PACs on monitor  PLAN: Wound care and activity restrictions reviewed in detail. Wound check in 7-10 days. Restart beta blocker. Replete K. Note coronary calcifications seen on fluoroscopy. Consider reevaluation for CAD and stopping flecainide and/or HCTZ.  Thurmon Fair, MD, [facility] [phone] 07/09/2011, 9:02 AM  Atrial lead  sensed P waves 1.9 mV, impedance 516 ohm, threshold 0.7@0 .5ms Ventr lead  sensed R waves 21.5 mV, impedance 784 ohm, threshold 0.8@0 .5ms No mode switch Thurmon Fair, MD, [facility] 9:51 AM

## 2011-07-10 NOTE — Discharge Summary (Signed)
Physician Discharge Summary  Patient ID: Corey [name] MRN: [id number] DOB/AGE: November 17, 1935 76 y.o.  Admit date: 07/08/2011 Discharge date: 07/09/2011  Discharge Diagnoses:  Active Problems:  Symptomatic bradycardia, secondary to sinus node dysfunction  Tachy-brady syndrome  S/P cardiac pacemaker procedure, 07/08/11, [employer]  CORONARY ARTERY DISEASE  OBSTRUCTIVE SLEEP APNEA  HYPERTENSION   Discharged Condition: good  Procedures:07/08/11, PPM by Dr. Nell [employer] device.  Interrogation at discharge: Atrial lead sensed P waves 1.9 mV, impedance 516 ohm, threshold 0.7@0 .5ms  Ventr lead sensed R waves 21.5 mV, impedance 784 ohm, threshold 0.8@0 .5ms  No mode switch   Hospital Course: 76 year old with fatigue and dyspnea attributable to sinus node dysfunction/chronotropic incompetence presented for elective PPM.  He underwent procedure without complications.  The next am he was ambulating without complaints.  He was seen and evaluated by Dr. Royann Shivers.  CXR without pneumothorax.  HR was elevated but he had not had his toprol in several days.  It was restarted at discharge.  K+ was low it was replaced and K+ po daily was added to home meds.  Pacer site without hematoma.  Pt. Stable for discharge.   Consults: None  Significant Diagnostic Studies: Na 137 K+ 3.4 Chloride 99 CO2 29 BUN 14  Cr. 1.0 Ca+ 9.0  Mg+ 2.2  H/H14.2/40.5    Discharge Exam: Blood pressure 138/89, pulse 79, temperature 98.9 F (37.2 C), temperature source Oral, resp. rate 15, height 6\' 1"  (1.854 m), weight 82.3 kg (181 lb 7 oz), SpO2 98.00%.   General appearance: alert, cooperative and no distress  Neck: no adenopathy, no carotid bruit, no JVD, supple, symmetrical, trachea midline and thyroid not enlarged, symmetric, no tenderness/mass/nodules  Lungs: clear to auscultation bilaterally  Heart: regular rate and rhythm, S1, S2 normal, no murmur, click, rub or gallop  Abdomen: soft,  non-tender; bowel sounds normal; no masses, no organomegaly  Extremities: extremities normal, atraumatic, no cyanosis or edema  Pulses: 2+ and symmetric  Skin: Skin color, texture, turgor normal. No rashes or lesions  Neurologic: Alert and oriented X 3, normal strength and tone. Normal symmetric reflexes. Normal coordination and gait   Disposition: 01-Home or Self Care  Discharge Orders    Future Appointments: Provider: Department: Dept Phone: Center:   07/13/2011 11:00 AM Hart Carwin, MD [facility] [place] Office [phone] [facility]   07/25/2011 10:00 AM Waymon Budge, MD [facility] [phone] None   09/19/2011 11:00 AM Waymon Budge, MD [facility] [phone] None     Medication List  As of 07/10/2011  1:00 PM   TAKE these medications         acetaminophen 325 MG tablet   Commonly known as: TYLENOL   Take 650 mg by mouth every 6 (six) hours as needed. For pain      amLODipine 5 MG tablet   Commonly known as: NORVASC   Take 2.5 mg by mouth daily.      aspirin 81 MG tablet   Take 81 mg by mouth daily.      clopidogrel 75 MG tablet   Commonly known as: PLAVIX   Take 75 mg by mouth daily.      flecainide 50 MG tablet   Commonly known as: TAMBOCOR   Take 50 mg by mouth 2 (two) times daily.      hydrochlorothiazide 25 MG tablet   Commonly known as: HYDRODIURIL   Take 12.5 mg by mouth daily.      HYDROcodone-acetaminophen 5-325 MG  per tablet   Commonly known as: NORCO   Take 1-2 tablets by mouth every 4 (four) hours as needed for pain.      metoprolol succinate 25 MG 24 hr tablet   Commonly known as: TOPROL-XL   Take 1 tablet (25 mg total) by mouth daily.      NASONEX 50 MCG/ACT nasal spray   Generic drug: mometasone   Place 1 spray into both nostrils Once daily as needed. For allergies      pantoprazole 40 MG tablet   Commonly known as: PROTONIX   Take 1 tablet (40 mg total) by mouth daily.      polyethylene glycol packet   Commonly known as: MIRALAX /  GLYCOLAX   Take 17 g by mouth daily as needed. For constipation      potassium chloride 10 MEQ tablet   Commonly known as: K-DUR   Take 2 tablets (20 mEq total) by mouth daily.      ramipril 2.5 MG tablet   Commonly known as: ALTACE   Take 2.5 mg by mouth daily.      simvastatin 40 MG tablet   Commonly known as: ZOCOR   Take 40 mg by mouth at bedtime.      zaleplon 10 MG capsule   Commonly known as: SONATA   Take 10-20 mg by mouth at bedtime. May take up to 2 tablets           Follow-up Information    Follow up with Wilburt Finlay, PA on 07/20/2011. (at 2:30 pm)    Contact information:   [address] [place] [address] [phone]       Follow up with Governor Rooks, MD on 07/26/2011. (4:15 pm with Dr.  )    Benay Pillow information:   [address]  [place] [address] [phone]         Supplemental Discharge Instructions for  Pacemaker Patients  Activity Do not raise your left arm above shoulder level or extend it backward beyond shoulder level for 2 weeks. Wear the arm sling as a reminder or as needed for comfort for 2 weeks. No heavy lifting or vigorous activity with your left arm for 6-8 weeks.    NO DRIVING is preferable for 2 weeks; If absolutely necessary, drive only short, familiar routes. DO wear your seatbelt, even if it crosses over the pacemaker site.  WOUND CARE   Keep the wound area clean and dry.  Remove the dressing the day after you return home (usually 48 hours after the procedure).   DO NOT SUBMERGE UNDER WATER UNTIL FULLY HEALED (no tub baths, hot tubs, swimming pools, etc.).    You  may shower or take a sponge bath after the dressing is removed. DO NOT SOAK the area and do not allow the shower to directly spray on the site.   If you have staples, these will be removed in the office in 7-14 days.   If you have tape/steri-strips on your wound, these will fall off; do  not pull them off prematurely.     No bandage is needed on the site.  DO  NOT apply any creams, oils, or ointments to the wound area.   If you notice any drainage or discharge from the wound, any swelling, excessive redness or bruising at the site, or if you develop a fever > 101? F after you are discharged home, call the office at  once.  Special Instructions   You are still able to use cellular telephones.  Avoid carrying your cellular phone near your device.   When traveling through airports, show security personnel your identification card to avoid being screened in the metal detectors.    Avoid arc welding equipment, MRI testing (magnetic resonance imaging), TENS units (transcutaneous nerve stimulators).  Call the office for questions about other devices.   Avoid electrical appliances that are in poor condition or are not properly grounded.   Microwave ovens are safe to be near or to operate.     SignedNada Boozer R 07/10/2011, 1:00 PM

## 2011-07-10 NOTE — Discharge Instructions (Signed)
Supplemental Discharge Instructions for  Pacemaker Patients  Activity Do not raise your left arm above shoulder level or extend it backward beyond shoulder level for 2 weeks. Wear the arm sling as a reminder or as needed for comfort for 2 weeks. No heavy lifting or vigorous activity with your left arm for 6-8 weeks.    NO DRIVING is preferable for 2 weeks; If absolutely necessary, drive only short, familiar routes. DO wear your seatbelt, even if it crosses over the pacemaker site.  WOUND CARE   Keep the wound area clean and dry.  Remove the dressing the day after you return home (usually 48 hours after the procedure).   DO NOT SUBMERGE UNDER WATER UNTIL FULLY HEALED (no tub baths, hot tubs, swimming pools, etc.).    You  may shower or take a sponge bath after the dressing is removed. DO NOT SOAK the area and do not allow the shower to directly spray on the site.   If you have staples, these will be removed in the office in 7-14 days.   If you have tape/steri-strips on your wound, these will fall off; do not pull them off prematurely.     No bandage is needed on the site.  DO  NOT apply any creams, oils, or ointments to the wound area.   If you notice any drainage or discharge from the wound, any swelling, excessive redness or bruising at the site, or if you develop a fever > 101? F after you are discharged home, call the office at once.  Special Instructions   You are still able to use cellular telephones.  Avoid carrying your cellular phone near your device.   When traveling through airports, show security personnel your identification card to avoid being screened in the metal detectors.    Avoid arc welding equipment, MRI testing (magnetic resonance imaging), TENS units (transcutaneous nerve stimulators).  Call the office for questions about other devices.   Avoid electrical appliances that are in poor condition or are not properly grounded.   Microwave ovens are safe to be near or to  operate.   

## 2011-07-13 ENCOUNTER — Ambulatory Visit (INDEPENDENT_AMBULATORY_CARE_PROVIDER_SITE_OTHER): Payer: Medicare Other | Admitting: Internal Medicine

## 2011-07-13 ENCOUNTER — Encounter: Payer: Self-pay | Admitting: Internal Medicine

## 2011-07-13 VITALS — BP 110/60 | HR 60

## 2011-07-13 DIAGNOSIS — R198 Other specified symptoms and signs involving the digestive system and abdomen: Secondary | ICD-10-CM

## 2011-07-13 DIAGNOSIS — K219 Gastro-esophageal reflux disease without esophagitis: Secondary | ICD-10-CM

## 2011-07-13 DIAGNOSIS — K222 Esophageal obstruction: Secondary | ICD-10-CM | POA: Diagnosis not present

## 2011-07-13 NOTE — Patient Instructions (Addendum)
We have sent the following medications to your pharmacy for you to pick up at your convenience: Protonix You will be due for a recall colonoscopy in 01/2013. We will send you a reminder in the mail when it gets closer to that time. CC: Dr Warnell Bureau

## 2011-07-13 NOTE — Progress Notes (Signed)
Corey [name] [birth year] MRN [id number]   History of Present Illness:  This is a 76 year old white male with gastroesophageal reflux disease and benign distal esophageal stricture. He comes for refills of Protonix 40 mg daily. His last appointment was in May 2011. His last upper endoscopy with dilatation was in June 2011 with Savary dilators up to 16 mm. He was noted to have duodenitis most likely due to alcohol use. Prior endoscopies and dilatations were in 1995, 1999, 2001, 2006. He had an upper GI bleed due to esophagitis while on Plavix in May 2011. He had a diverticular bleed in January 2010 when he had his last colonoscopy which showed severe diverticular disease of the left colon and a tubular adenoma. He has a history of prostate cancer and coronary artery disease followed by Dr. Alanda Amass. He is status post permanent transvenous pacemaker insertion last week for atrial fibrillation.   Past Medical History  Diagnosis Date  . Alcohol abuse, episodic 05/19/2008  . ALLERGIC RHINITIS 04/15/2009  . COLONIC POLYPS, ADENOMATOUS, HX OF 02/14/2008  . DIVERTICULOSIS, COLON 02/14/2008  . ESOPHAGEAL STRICTURE 01/28/2008  . GERD 02/14/2008  . HYPERGLYCEMIA 11/16/2006  . HYPERLIPIDEMIA 02/14/2008  . INSOMNIA-SLEEP DISORDER-UNSPEC 02/11/2009  . MYOCARDIAL INFARCTION, HX OF 11/16/2006  . 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 PROSTATE CANCER, HX OF 02/25/2000  . TRANSIENT ISCHEMIC ATTACKS, HX OF 11/16/2006  . Arthritis   . Cancer 2002  . Atrial fibrillation 11/16/2006    remote CHF related to atrial fib with rapid ventricular response over 25 yrs per office note,  . ASTHMA 11/16/2006    sinusitis-    hx ?yeast patch/white patch on vocal cord as per [facility] 02/25/11-   . OBSTRUCTIVE SLEEP APNEA 11/10/2008  . SLEEP APNEA 10/03/2009    LOV Dr Maple Hudson 12/12 in EPIC    Moderate per patient- settings ?6/last sleep study years ago  . HYPERTENSION 11/16/2006  . CORONARY ARTERY DISEASE 11/16/2006    nonischemic  cardiomyapathy, last stress test 2011- LOV with clearance note Dr  Alanda Amass and EKG 1/13 on chart  . Symptomatic bradycardia, secondary to sinus node dysfunction 07/08/2011  . Tachy-brady syndrome 07/08/2011  . S/P cardiac pacemaker procedure, 07/08/11 07/08/2011   Past Surgical History  Procedure Date  . Tonsilectomy, adenoidectomy, bilateral myringotomy and tubes   . Knee arthroscopy     2 surgeries right and 1 left  . Prostate surgery 2/02    prostate cancer  . Uvulopalatopharyngoplasty   . Cardiac catheterization   . Tonsillectomy   . Esophagogastroduodenoscopy     with dilitation  . Lumbar laminectomy/decompression microdiscectomy 03/02/2011    Procedure: LUMBAR LAMINECTOMY/DECOMPRESSION MICRODISCECTOMY;  Surgeon: Javier Docker, MD;  Location: [facility];  Service: Orthopedics;  Laterality: N/A;  Decompression Lumbar 4 - Lumbar(X-Ray)    reports that he quit smoking about 47 years ago. His smoking use included Cigarettes. He quit smokeless tobacco use about 47 years ago. He reports that he drinks about 8.4 ounces of alcohol per week. He reports that he does not use illicit drugs. family history includes Heart attack in his brother, father, and maternal uncle and Heart disease in his maternal uncle.  There is no history of Colon cancer. No Known Allergies      Review of Systems: Occasional dysphagia. Denies heartburn. Positive for change in bowel habits to frequent stools  The remainder of the 10 point ROS is negative except as outlined in H&P   Physical Exam: General appearance  Well developed, in no distress.  Eyes- non icteric. HEENT nontraumatic, normocephalic. Mouth no lesions, tongue papillated, no cheilosis. Neck supple without adenopathy, thyroid not enlarged, no carotid bruits, no JVD. Lungs Clear to auscultation bilaterally. Cor normal S1, normal S2, regular rhythm, no murmur,  quiet precordium. Abdomen: Soft nontender nondistended. Normoactive bowel sounds. Liver edge at  costal margin. Rectal: Not done. Extremities no pedal edema. Skin no lesions. Neurological alert and oriented x 3. Psychological normal mood and affect.  Assessment and Plan:  Problem #1 Gastroesophageal reflux disease with a history of benign distal esophageal stricture last dilated in 2011. He denies needing another dilatation. We will go ahead and refill his Protonix. He needs to abstain from alcohol and continue antireflux measures.  Problem #2 Change in bowel habits in the setting of severe sigmoid diverticulosis. Patient is status post remote diverticular bleed in 2010. She needs to get back on Citrucel 1 teaspoon daily and increase it gradually to 1 tablespoon daily.  Problem #3 History of adenomatous polyp of the colon removed on a colonoscopy in 2010. A recall colonoscopy will be due in January 2015.   07/13/2011 Lina Sar

## 2011-07-19 ENCOUNTER — Telehealth: Payer: Self-pay | Admitting: Internal Medicine

## 2011-07-19 NOTE — Telephone Encounter (Signed)
Pt called and said that he had a pace maker put in. Pts cardiologist said that pt needs to go off of the Sonata asap and get put back on zolpidem (AMBIEN) tablet 5 mg. Pls call in to Leonie Douglas today at  [phone]. Pt is leaving to go out of town tomorrow and needs this called in today.

## 2011-07-19 NOTE — Telephone Encounter (Signed)
Pt had a pacemaker placed yesterday

## 2011-07-20 MED ORDER — ZALEPLON 10 MG PO CAPS: 10.0000 mg | ORAL_CAPSULE | Freq: Every day | ORAL | Status: DC

## 2011-07-20 NOTE — Telephone Encounter (Signed)
Rx called in , Pt informed on personally identified VM

## 2011-07-20 NOTE — Telephone Encounter (Signed)
OK  #30  5mg   RF 2

## 2011-07-21 DIAGNOSIS — Z79899 Other long term (current) drug therapy: Secondary | ICD-10-CM | POA: Diagnosis not present

## 2011-07-21 DIAGNOSIS — R5383 Other fatigue: Secondary | ICD-10-CM | POA: Diagnosis not present

## 2011-07-21 DIAGNOSIS — R6889 Other general symptoms and signs: Secondary | ICD-10-CM | POA: Diagnosis not present

## 2011-07-21 DIAGNOSIS — I4949 Other premature depolarization: Secondary | ICD-10-CM | POA: Diagnosis not present

## 2011-07-21 DIAGNOSIS — I495 Sick sinus syndrome: Secondary | ICD-10-CM | POA: Diagnosis not present

## 2011-07-25 ENCOUNTER — Institutional Professional Consult (permissible substitution): Payer: Medicare Other | Admitting: Internal Medicine

## 2011-08-04 ENCOUNTER — Ambulatory Visit (INDEPENDENT_AMBULATORY_CARE_PROVIDER_SITE_OTHER): Payer: Medicare Other

## 2011-08-04 DIAGNOSIS — J309 Allergic rhinitis, unspecified: Secondary | ICD-10-CM | POA: Diagnosis not present

## 2011-08-05 DIAGNOSIS — C61 Malignant neoplasm of prostate: Secondary | ICD-10-CM | POA: Diagnosis not present

## 2011-08-09 DIAGNOSIS — R0602 Shortness of breath: Secondary | ICD-10-CM | POA: Diagnosis not present

## 2011-08-09 DIAGNOSIS — Z45018 Encounter for adjustment and management of other part of cardiac pacemaker: Secondary | ICD-10-CM | POA: Diagnosis not present

## 2011-08-09 DIAGNOSIS — I4891 Unspecified atrial fibrillation: Secondary | ICD-10-CM | POA: Diagnosis not present

## 2011-08-11 ENCOUNTER — Encounter (HOSPITAL_COMMUNITY): Payer: Self-pay | Admitting: Pharmacy Technician

## 2011-08-11 DIAGNOSIS — R0602 Shortness of breath: Secondary | ICD-10-CM | POA: Diagnosis not present

## 2011-08-11 DIAGNOSIS — I4891 Unspecified atrial fibrillation: Secondary | ICD-10-CM | POA: Diagnosis not present

## 2011-08-12 DIAGNOSIS — R5381 Other malaise: Secondary | ICD-10-CM | POA: Diagnosis not present

## 2011-08-12 DIAGNOSIS — R6889 Other general symptoms and signs: Secondary | ICD-10-CM | POA: Diagnosis not present

## 2011-08-12 DIAGNOSIS — R0602 Shortness of breath: Secondary | ICD-10-CM | POA: Diagnosis not present

## 2011-08-15 ENCOUNTER — Other Ambulatory Visit: Payer: Self-pay | Admitting: Internal Medicine

## 2011-08-16 ENCOUNTER — Other Ambulatory Visit: Payer: Self-pay | Admitting: Cardiovascular Disease

## 2011-08-17 DIAGNOSIS — F4322 Adjustment disorder with anxiety: Secondary | ICD-10-CM | POA: Diagnosis not present

## 2011-08-18 ENCOUNTER — Ambulatory Visit (HOSPITAL_COMMUNITY)
Admission: RE | Admit: 2011-08-18 | Discharge: 2011-08-18 | Disposition: A | Discharge status: Home / Self Care | Payer: Medicare Other | Source: Ambulatory Visit | Attending: Cardiovascular Disease | Admitting: Cardiovascular Disease

## 2011-08-18 ENCOUNTER — Encounter (HOSPITAL_COMMUNITY)
Admission: RE | Disposition: A | Discharge status: Home / Self Care | Payer: Self-pay | Source: Ambulatory Visit | Attending: Cardiovascular Disease

## 2011-08-18 DIAGNOSIS — I509 Heart failure, unspecified: Secondary | ICD-10-CM | POA: Diagnosis not present

## 2011-08-18 DIAGNOSIS — Z950 Presence of cardiac pacemaker: Secondary | ICD-10-CM | POA: Diagnosis not present

## 2011-08-18 DIAGNOSIS — I5020 Unspecified systolic (congestive) heart failure: Secondary | ICD-10-CM | POA: Insufficient documentation

## 2011-08-18 DIAGNOSIS — I4891 Unspecified atrial fibrillation: Secondary | ICD-10-CM | POA: Diagnosis not present

## 2011-08-18 HISTORY — PX: LEFT AND RIGHT HEART CATHETERIZATION WITH CORONARY ANGIOGRAM: SHX5449

## 2011-08-18 LAB — POCT I-STAT 3, VENOUS BLOOD GAS (G3P V)
Acid-base deficit: 1 mmol/L (ref 0.0–2.0)
Bicarbonate: 24.4 mEq/L — ABNORMAL HIGH (ref 20.0–24.0)
O2 Saturation: 62 %
O2 Saturation: 70 %
pCO2, Ven: 45.5 mmHg (ref 45.0–50.0)
pH, Ven: 7.367 — ABNORMAL HIGH (ref 7.250–7.300)
pO2, Ven: 38 mmHg (ref 30.0–45.0)

## 2011-08-18 LAB — POCT I-STAT 3, ART BLOOD GAS (G3+)
O2 Saturation: 93 %
TCO2: 26 mmol/L (ref 0–100)
pCO2 arterial: 39.4 mmHg (ref 35.0–45.0)

## 2011-08-18 LAB — PROTIME-INR: INR: 1.08 (ref 0.00–1.49)

## 2011-08-18 SURGERY — LEFT AND RIGHT HEART CATHETERIZATION WITH CORONARY ANGIOGRAM
Anesthesia: LOCAL

## 2011-08-18 MED ORDER — ASPIRIN 81 MG PO CHEW
CHEWABLE_TABLET | ORAL | Status: AC
  Filled 2011-08-18: qty 4

## 2011-08-18 MED ORDER — ACETAMINOPHEN 325 MG PO TABS: 650.0000 mg | ORAL_TABLET | ORAL | Status: DC | PRN

## 2011-08-18 MED ORDER — HEPARIN (PORCINE) IN NACL 2-0.9 UNIT/ML-% IJ SOLN
INTRAMUSCULAR | Status: AC
  Filled 2011-08-18: qty 1000

## 2011-08-18 MED ORDER — FENTANYL CITRATE 0.05 MG/ML IJ SOLN
INTRAMUSCULAR | Status: AC
  Filled 2011-08-18: qty 2

## 2011-08-18 MED ORDER — ASPIRIN 81 MG PO CHEW
324.0000 mg | CHEWABLE_TABLET | ORAL | Status: AC
  Administered 2011-08-18: 324 mg via ORAL

## 2011-08-18 MED ORDER — SODIUM CHLORIDE 0.9 % IV SOLN: 1.0000 mL/kg/h | INTRAVENOUS | Status: DC

## 2011-08-18 MED ORDER — MIDAZOLAM HCL 2 MG/2ML IJ SOLN
INTRAMUSCULAR | Status: AC
  Filled 2011-08-18: qty 2

## 2011-08-18 MED ORDER — ONDANSETRON HCL 4 MG/2ML IJ SOLN: 4.0000 mg | Freq: Four times a day (QID) | INTRAMUSCULAR | Status: DC | PRN

## 2011-08-18 MED ORDER — SODIUM CHLORIDE 0.9 % IV SOLN: 250.0000 mL | INTRAVENOUS | Status: DC | PRN

## 2011-08-18 MED ORDER — NITROGLYCERIN 0.2 MG/ML ON CALL CATH LAB
INTRAVENOUS | Status: AC
  Filled 2011-08-18: qty 1

## 2011-08-18 MED ORDER — SODIUM CHLORIDE 0.9 % IJ SOLN: 3.0000 mL | Freq: Two times a day (BID) | INTRAMUSCULAR | Status: DC

## 2011-08-18 MED ORDER — LIDOCAINE HCL (PF) 1 % IJ SOLN
INTRAMUSCULAR | Status: AC
  Filled 2011-08-18: qty 30

## 2011-08-18 MED ORDER — SODIUM CHLORIDE 0.9 % IV SOLN
INTRAVENOUS | Status: DC
  Administered 2011-08-18: 08:00:00 via INTRAVENOUS

## 2011-08-18 NOTE — CV Procedure (Signed)
Corey [name], [place] Male, 76 y.o., 1935-08-16  Location: [facility]  Bed: NONE  MRN: [id number]  CSN: [id number]  CARDIAC CATHETERIZATION REPORT   Procedures performed:  1. Right and left heart catheterization  2. Selective coronary angiography  3. Left ventriculography   Reason for procedure:  Congestive heart failure  Procedure performed by: Thurmon Fair, MD, [facility]  Complications: none   Estimated blood loss: less than 5 mL   History:  This is a 76 year old gentleman with recent implantation of a dual-chamber permanent pacemaker for sinus node dysfunction and chronotropic incompetence as well as tachycardia-bradycardia syndrome from due to paroxysmal atrial fibrillation.  Echocardiography shows decline in left ventricular systolic function now with an ejection fraction of approximately 25%, a marked reduction compared to 07/21/2010 lead echo showed an EF of 45-50%. She has evidence of an intermittent left bundle branch block. Following pacemaker implantation he still has complaints of fatigue and dyspnea on exertion.  Consent: The risks, benefits, and details of the procedure were explained to the patient. Risks including death, MI, stroke, bleeding, limb ischemia, renal failure and allergy were described and accepted by the patient. Informed written consent was obtained prior to proceeding.  Technique: The patient was brought to the cardiac catheterization laboratory in the fasting state. He was prepped and draped in the usual sterile fashion. Local anesthesia with 1% lidocaine was administered to the right groin area. Using the modified Seldinger technique a 5 French right common femoral artery sheath and a 7 French right common femoral vein sheath were introduced without difficulty. Later the 5 French arterial sheath was exchanged for a longer 25 cm 5 French sheath do to tortuosity of the iliac arteries.   Under fluoroscopic guidance and using a 7 French balloon-tip Swan-Ganz catheter  pressure recordings were performed in the pulmonary wedge position pulmonary artery right ventricle and right atrium respectively. Synchronous samples of blood were cemented for oxygen saturation from the pulmonary artery and the femoral artery as well as subsequent sample from the right atrium.  Under fluoroscopic guidance, using 5 [place] JL4, WR and angled pigtail catheters, selective cannulation of the left coronary artery, right coronary artery and left ventricle were respectively performed. Several coronary angiograms in a variety of projections were recorded, as well as a left ventriculogram in the RAO projection. Left ventricular pressure and a pull back to the aorta were recorded. No immediate complications occurred. At the end of the procedure, all catheters were removed. After the procedure, hemostasis will be achieved with manual pressure.  Contrast used: 110 mL Omnipaque  Hemodynamic findings:  Aortic pressure 141/81 mmHg (mean 105 mm Hg)  Left ventricle 145/11 mm Hg with end-diastolic pressure of 17 mm Hg  Pulmonary artery wedge pressure A wave 17, V wave 15, mean 14 mm Hg  Pulmonary artery 28/14 (mean 20 ) mm Hg  Right ventricle 28/9 with an end-diastolic pressure of 13 mm Hg  Right atrium A-wave 14, V wave 12, mean 13 mm Hg  Cardiac output is 4.65 L per minute (cardiac index 2.27 L per minute per meter sq)  PA oxygen saturation 64%. No RA to PA "step-up".   Angiographic Findings:  1. The left main coronary artery is free of significant atherosclerosis and trifurcates into the left anterior descending artery , a large ramus intermedius artery and the dominant left circumflex coronary artery.  2. The left anterior descending artery is a large vessel that reaches the apex and generates two medium size diagonal branches. There is evidence of extensive  luminal irregularities and mild proximal calcification, but no hemodynamically meaningful stenoses are seen. The ramus  intermedius is large and free of significant obstruction. 3. The left circumflex coronary artery is a large-size vessel dominant vessel that generates three medium size oblique marginal arteries and the posterior descending artery. There is evidence of extensive luminal irregularities and mild proximal calcification. A 50% eccentric stenosis is seen in the proximal vessel, but no other hemodynamically meaningful stenoses are seen. 4. The right coronary artery is a very small-size non dominant vessel, free of significant disease.  5. The left ventricle is mild to moderately dilated. The left ventricle systolic function is severely decreased with an estimated ejection fraction of 25%. Regional wall motion abnormalities are not seen. No left ventricular thrombus is seen. There is mild mitral insufficiency. The ascending aorta appears normal. There is no aortic valve stenosis by pullback.    IMPRESSIONS:  Although there is evidence of moderate coronary artery disease involving a dominant left circumflex artery this cannot explain the severity of the patient's global left ventricular dysfunction. He appears to have a nonischemic dilated cardiomyopathy of uncertain etiology.  The presence of a left bundle branch block raises the opportunity for biventricular pacing/CRT. However the left bundle branch block appears to be an intermittent finding. Although he has conduction system disease, he does not have frequent ventricular pacing at this point (although this can be anticipated in the future).  RECOMMENDATION:  Followup as scheduled with electrophysiology to discuss device upgrade to a dual-chamber biventricular pacemaker defibrillator. He meets criteria for ICD implantation by the SCD-HFT criteria. He is already on long-term treatment with metoprolol succinate and ramipril, albeit only moderate doses.  Coronary risk fracture modification. Consider switching to warfarin therapy due to history of paroxysmal  atrial fibrillation in the setting of severe left ventricular dysfunction.

## 2011-08-18 NOTE — H&P (Signed)
Date of Initial H&P: Dr. Alanda Amass 08/10/11  History reviewed, patient examined, no change in status, stable for surgery. Recently diagnosed reduction in LV systolic function and clinical HF in a patient s/p recent PPM implantation for sinus node dysfunction, here for R&L heart cath. Thurmon Fair, MD, [facility] [phone] office [phone] pager  08/18/2011 8:00 AM

## 2011-08-22 ENCOUNTER — Other Ambulatory Visit: Payer: Self-pay | Admitting: Internal Medicine

## 2011-08-22 ENCOUNTER — Encounter: Payer: Self-pay | Admitting: Internal Medicine

## 2011-08-22 ENCOUNTER — Ambulatory Visit (INDEPENDENT_AMBULATORY_CARE_PROVIDER_SITE_OTHER): Payer: Medicare Other | Admitting: Internal Medicine

## 2011-08-22 VITALS — BP 120/80 | HR 90 | Resp 18 | Ht 73.0 in | Wt 182.8 lb

## 2011-08-22 DIAGNOSIS — I498 Other specified cardiac arrhythmias: Secondary | ICD-10-CM | POA: Diagnosis not present

## 2011-08-22 DIAGNOSIS — I4891 Unspecified atrial fibrillation: Secondary | ICD-10-CM | POA: Diagnosis not present

## 2011-08-22 DIAGNOSIS — I495 Sick sinus syndrome: Secondary | ICD-10-CM

## 2011-08-22 DIAGNOSIS — I5022 Chronic systolic (congestive) heart failure: Secondary | ICD-10-CM

## 2011-08-22 DIAGNOSIS — Z95810 Presence of automatic (implantable) cardiac defibrillator: Secondary | ICD-10-CM | POA: Diagnosis not present

## 2011-08-22 DIAGNOSIS — R001 Bradycardia, unspecified: Secondary | ICD-10-CM

## 2011-08-22 LAB — PACEMAKER DEVICE OBSERVATION
AL AMPLITUDE: 3 mv
ATRIAL PACING PM: 64
DEVICE MODEL PM: 111944
VENTRICULAR PACING PM: 13

## 2011-08-22 NOTE — Patient Instructions (Addendum)
Your physician wants you to follow-up in: 6 weeks with Dr Johney Frame Bonita Quin will receive a reminder letter in the mail two months in advance. If you don't receive a letter, please call our office to schedule the follow-up appointment.

## 2011-08-23 ENCOUNTER — Ambulatory Visit: Payer: Medicare Other | Admitting: Internal Medicine

## 2011-08-25 ENCOUNTER — Ambulatory Visit (INDEPENDENT_AMBULATORY_CARE_PROVIDER_SITE_OTHER): Payer: Medicare Other

## 2011-08-25 DIAGNOSIS — J309 Allergic rhinitis, unspecified: Secondary | ICD-10-CM | POA: Diagnosis not present

## 2011-08-26 ENCOUNTER — Telehealth: Payer: Self-pay | Admitting: Internal Medicine

## 2011-08-26 NOTE — Telephone Encounter (Signed)
Pt called and is req to get a script for Shingles Vax, he can get vax at [facility] or [facility]. Pls call when ready for pick up.

## 2011-08-28 ENCOUNTER — Encounter: Payer: Self-pay | Admitting: Internal Medicine

## 2011-08-28 DIAGNOSIS — I5022 Chronic systolic (congestive) heart failure: Secondary | ICD-10-CM | POA: Insufficient documentation

## 2011-08-28 NOTE — Progress Notes (Signed)
Primary Care Physician: Judie Petit, MD Referring Physician:  Dr Pollyann Glen Corey [name] is a 76 y.o. male with a h/o CAD, mixed cardiomyopathy, tachy/brady syndrome s/p PPM by Dr Royann Shivers 6/13 and afib who presents today for EP consultation.  He reports doing reasonably well until February when he underwent laminectomy.  He has had a progressive decline since that time.  He reports significant anxiety and palpitations since that time.  His spouse admits that he has been having panic episodes since February.  He was evaluated and felt to require PPM implantation 6/13 for tachy/brady syndrome.  He therefore underwent implantation of a [employer]vantio  PPM 07/10/11.  He reports that he has "done much worse" since having his pacemaker implanted.  He reports progressive fatigue and decreased exercise tolerance.  He also notes that his pulse is frequently elevated.  He finds that this contributes to an overall sense of nervousness and despair.  He was evaluated by Dr Alanda Amass and found to have an EF of 25% by echo 08/11/11.  His flecainide for afib was discontinued and he has been placed on amiodarone.  He is referred to me for further EP consultation.  Today, he denies symptoms of chest pain, shortness of breath, orthopnea, PND, lower extremity edema, dizziness, presyncope, syncope, or neurologic sequela. The patient is tolerating medications without difficulties and is otherwise without complaint today.   Past Medical History  Diagnosis Date  . Alcohol abuse, episodic 05/19/2008  . ALLERGIC RHINITIS 04/15/2009  . COLONIC POLYPS, ADENOMATOUS, HX OF 02/14/2008  . DIVERTICULOSIS, COLON 02/14/2008  . ESOPHAGEAL STRICTURE 01/28/2008  . GERD 02/14/2008  . HYPERGLYCEMIA 11/16/2006  . HYPERLIPIDEMIA 02/14/2008  . INSOMNIA-SLEEP DISORDER-UNSPEC 02/11/2009  . 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 PROSTATE CANCER, HX OF 02/25/2000  . TRANSIENT ISCHEMIC ATTACKS, HX OF 11/16/2006  .  Arthritis   . Atrial fibrillation 11/16/2006    remote CHF related to atrial fib with rapid ventricular response over 25 yrs per office note,  . ASTHMA 11/16/2006    sinusitis-    hx ?yeast patch/white patch on vocal cord as per [facility] 02/25/11-   . OBSTRUCTIVE SLEEP APNEA 11/10/2008  . SLEEP APNEA 10/03/2009    LOV Dr Maple Hudson 12/12 in EPIC    Moderate per patient- settings ?6/last sleep study years ago  . HYPERTENSION 11/16/2006  . CORONARY ARTERY DISEASE 11/16/2006    nonischemic cardiomyapathy, last stress test 2011- LOV with clearance note Dr  Alanda Amass and EKG 1/13 on chart  . Symptomatic bradycardia, secondary to sinus node dysfunction 07/08/2011    s/p [employer] PPM by Dr Royann Shivers   Past Surgical History  Procedure Date  . Tonsilectomy, adenoidectomy, bilateral myringotomy and tubes   . Knee arthroscopy     2 surgeries right and 1 left  . Prostate surgery 2/02    prostate cancer  . Uvulopalatopharyngoplasty   . Cardiac catheterization   . Tonsillectomy   . Esophagogastroduodenoscopy     with dilitation  . Lumbar laminectomy/decompression microdiscectomy 03/02/2011    Procedure: LUMBAR LAMINECTOMY/DECOMPRESSION MICRODISCECTOMY;  Surgeon: Javier Docker, MD;  Location: [facility];  Service: Orthopedics;  Laterality: N/A;  Decompression Lumbar 4 - Lumbar(X-Ray)  . Pacemaker insertion 06/2011    [employer] PPM implanted by Dr Royann Shivers    Current Outpatient Prescriptions  Medication Sig Dispense Refill  . acetaminophen (TYLENOL) 325 MG tablet Take 650 mg by mouth every 6 (six) hours as needed. For pain      .  amiodarone (PACERONE) 200 MG tablet Take 200 mg by mouth daily.       Marland Kitchen aspirin 81 MG tablet Take 81 mg by mouth daily.       . clopidogrel (PLAVIX) 75 MG tablet Take 75 mg by mouth daily.      Marland Kitchen LORazepam (ATIVAN) 0.5 MG tablet Take 0.5 mg by mouth every 8 (eight) hours.      . metoprolol succinate (TOPROL-XL) 25 MG 24 hr tablet Take 25 mg by mouth 2 (two) times daily.       Marland Kitchen NASONEX 50 MCG/ACT nasal spray Place 1 spray into both nostrils Once daily as needed. For allergies      . pantoprazole (PROTONIX) 40 MG tablet TAKE ONE TABLET EVERY MORNING  30 tablet  5  . polyethylene glycol (MIRALAX / GLYCOLAX) packet Take 17 g by mouth daily as needed. For constipation      . potassium chloride (K-DUR) 10 MEQ tablet Take 2 tablets (20 mEq total) by mouth daily.  60 tablet  4  . ramipril (ALTACE) 5 MG tablet Take 5 mg by mouth daily.      Marland Kitchen zolpidem (AMBIEN) 5 MG tablet Take 5 mg by mouth at bedtime.       Current Facility-Administered Medications  Medication Dose Route Frequency Provider Last Rate Last Dose  . levalbuterol (XOPENEX) nebulizer solution 0.63 mg  0.63 mg Nebulization Once Waymon Budge, MD        Allergies  Allergen Reactions  . Hydrocodone Other (See Comments)    Severe panic attacks  . Oxycodone Other (See Comments)    Severe panic attacks    History   Social History  . Marital Status: Married    Spouse Name: N/A    Number of Children: N/A  . Years of Education: N/A   Occupational History  . Not on file.   Social History Main Topics  . Smoking status: Former Smoker    Types: Cigarettes    Quit date: 03/22/1964  . Smokeless tobacco: Former Neurosurgeon    Quit date: 05/10/1964  . Alcohol Use: 8.4 oz/week    14 Shots of liquor per week     2 mixed drinks daily  . Drug Use: No  . Sexually Active: Not on file   Other Topics Concern  . Not on file   Social History Narrative  . No narrative on file    Family History  Problem Relation Age of Onset  . Heart attack Father   . Heart attack Brother   . Heart disease Maternal Uncle   . Heart attack Maternal Uncle   . Colon cancer Neg Hx     ROS- All systems are reviewed and negative except as per the HPI above  Physical Exam: Filed Vitals:   08/22/11 1144  BP: 120/80  Pulse: 90  Resp: 18  Height: 6\' 1"  (1.854 m)  Weight: 182 lb 12.8 oz (82.918 kg)  SpO2: 97%    GEN-  The patient is anxious appearing, alert and oriented x 3 today.   Head- normocephalic, atraumatic Eyes-  Sclera clear, conjunctiva pink Ears- hearing intact Oropharynx- clear Neck- supple, no JVP Lymph- no cervical lymphadenopathy Lungs- Clear to ausculation bilaterally, normal work of breathing Heart- Regular rate and rhythm, no murmurs, rubs or gallops, PMI not laterally displaced GI- soft, NT, ND, + BS Extremities- no clubbing, cyanosis, trace edema MS- no significant deformity or atrophy Skin- no rash or lesion Psych- euthymic mood, full affect Neuro- strength  and sensation are intact Pacemaker pocket is healed  Myoview 5/11 reviewed  Echo 08/11/11- EF <25%, severe global HK, normal RV size with mildly reduced function, mild MR, mild TR EKG 7/01/30/11- atrial pacing with LBBB(QRS 164 msec).  Pacemaker interrogation is reviewed today and reveals that he is having very frequent and almost incessant  episodes of PMT even with PVARB extension on.  I have reprogrammed his device DDIR today and decreased pacing rate to 55 bpm  Assessment and Plan:

## 2011-08-28 NOTE — Assessment & Plan Note (Signed)
The patient has a long standing h/o a mixed cardiomyopathy with depressed EF and LBBB.  His most recent echo despite optimal medical therapy revealed an EF of <25%.   He may require upgrade of his pacemaker to a CRT-D device.  Presently, however, he is reluctant to consider another procedure.  He has had a very brisk decline since his laminectomy in February and subsequent pacemaker implant in June.  At this point, I think that we should proceed slowly. I have reprogrammed his device as above due to PMT and symptoms associated with his pacemaker implant.  I will see him again in 4 weeks.  We will see how he is doing at that point.  If he is clinically improved then we will repeat an echo in about 8-10 weeks.  If he is not, then we will further consider device upgrade.

## 2011-08-28 NOTE — Assessment & Plan Note (Signed)
The patient is s/p recent PPM implantation.  He has not done well since his pacemaker was implanted.  He reports that his heart rate is frequently elevated and contributes to his prior symptoms of "panic".  He has noticed decreased exercise tolerance since his pacemaker was placed.  Todays interrogation reveals that he is having very frequent episodes of PMT even with the PVARB extension on.  Since last interrogation, he is 13% V paced.  I suspect that all of this is PMT.  This may be contributing to his symptoms. I have therefore reprogrammed today DDIR to eliminate PMT and also lowered his pacing rate to 55 bpm due to his concerns that his heart rate is too fast.

## 2011-08-28 NOTE — Assessment & Plan Note (Signed)
Presently in sinus rhythm. He has been recently started on amiodarone by Dr Alanda Amass. Given his advanced age, HTN, CAD, and prior TIA he is at increased risk for stroke.  He should ideally be anticoagulated with either coumadin or a novel anticoagulant.  I will defer this decision to Dr Alanda Amass.

## 2011-08-29 NOTE — Telephone Encounter (Signed)
rx ready for pickup 

## 2011-08-30 ENCOUNTER — Other Ambulatory Visit: Payer: Self-pay | Admitting: *Deleted

## 2011-08-30 MED ORDER — ZOSTER VACCINE LIVE 19400 UNT/0.65ML ~~LOC~~ SOLR: 0.6500 mL | Freq: Once | SUBCUTANEOUS | Status: AC

## 2011-09-05 ENCOUNTER — Telehealth: Payer: Self-pay | Admitting: Internal Medicine

## 2011-09-05 DIAGNOSIS — L570 Actinic keratosis: Secondary | ICD-10-CM | POA: Diagnosis not present

## 2011-09-05 DIAGNOSIS — L821 Other seborrheic keratosis: Secondary | ICD-10-CM | POA: Diagnosis not present

## 2011-09-05 DIAGNOSIS — L578 Other skin changes due to chronic exposure to nonionizing radiation: Secondary | ICD-10-CM | POA: Diagnosis not present

## 2011-09-05 NOTE — Telephone Encounter (Signed)
All started 3-4 days after seeing Dr Johney Frame  We are going to get him worked in next week when Dr Johney Frame returns.  I have made him aware and will have Melissa call him with an appointment.

## 2011-09-05 NOTE — Telephone Encounter (Signed)
This all started the 3rd or 4th day a

## 2011-09-05 NOTE — Telephone Encounter (Signed)
F/u   Patient calling for f/u status

## 2011-09-05 NOTE — Telephone Encounter (Signed)
New Problem:    Patient called in because he feels like his heart is skipping beats, has PVC's and he is SOB and would like to be seen as soon as possible by Dr. Johney Frame.  Please call back.

## 2011-09-07 ENCOUNTER — Encounter: Payer: Self-pay | Admitting: Internal Medicine

## 2011-09-07 ENCOUNTER — Ambulatory Visit (INDEPENDENT_AMBULATORY_CARE_PROVIDER_SITE_OTHER): Payer: Medicare Other | Admitting: Internal Medicine

## 2011-09-07 VITALS — BP 162/98 | HR 64 | Temp 97.6°F | Wt 184.0 lb

## 2011-09-07 DIAGNOSIS — I5022 Chronic systolic (congestive) heart failure: Secondary | ICD-10-CM

## 2011-09-07 DIAGNOSIS — F1010 Alcohol abuse, uncomplicated: Secondary | ICD-10-CM

## 2011-09-07 DIAGNOSIS — I10 Essential (primary) hypertension: Secondary | ICD-10-CM

## 2011-09-07 DIAGNOSIS — I2510 Atherosclerotic heart disease of native coronary artery without angina pectoris: Secondary | ICD-10-CM

## 2011-09-07 NOTE — Assessment & Plan Note (Signed)
He has f/u with dr. Johney Frame-- considering ICD

## 2011-09-07 NOTE — Assessment & Plan Note (Signed)
Advised complete abstinence. He is still drinking at least 2 drinks daily

## 2011-09-07 NOTE — Progress Notes (Signed)
Patient ID: Corey [name], male   DOB: 26-Apr-1935, 76 y.o.   MRN: [id number] F/u chf He has been seeing dr Alanda Amass and dr allred Reviewed recent notes  i have caled for echo  He currently is feeling ok-SOB is stable  Past Medical History  Diagnosis Date  . Alcohol abuse, episodic 05/19/2008  . ALLERGIC RHINITIS 04/15/2009  . COLONIC POLYPS, ADENOMATOUS, HX OF 02/14/2008  . DIVERTICULOSIS, COLON 02/14/2008  . ESOPHAGEAL STRICTURE 01/28/2008  . GERD 02/14/2008  . HYPERGLYCEMIA 11/16/2006  . HYPERLIPIDEMIA 02/14/2008  . INSOMNIA-SLEEP DISORDER-UNSPEC 02/11/2009  . 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 PROSTATE CANCER, HX OF 02/25/2000  . TRANSIENT ISCHEMIC ATTACKS, HX OF 11/16/2006  . Arthritis   . Atrial fibrillation 11/16/2006    remote CHF related to atrial fib with rapid ventricular response over 25 yrs per office note,  . ASTHMA 11/16/2006    sinusitis-    hx ?yeast patch/white patch on vocal cord as per [facility] 02/25/11-   . OBSTRUCTIVE SLEEP APNEA 11/10/2008  . SLEEP APNEA 10/03/2009    LOV Dr Maple Hudson 12/12 in EP[facility]    Moderate per patient- settings ?6/last sleep study years ago  . HYPERTENSION 11/16/2006  . CORONARY ARTERY DISEASE 11/16/2006    nonischemic cardiomyapathy, last stress test 2011- LOV with clearance note Dr  Alanda Amass and EKG 1/13 on chart  . Symptomatic bradycardia, secondary to sinus node dysfunction 07/08/2011    s/p [employer] PPM by Dr Royann Shivers  . S/P cardiac pacemaker procedure, 07/08/11, [employer] 07/08/2011    History   Social History  . Marital Status: Married    Spouse Name: N/A    Number of Children: N/A  . Years of Education: N/A   Occupational History  . Not on file.   Social History Main Topics  . Smoking status: Former Smoker    Types: Cigarettes    Quit date: 03/22/1964  . Smokeless tobacco: Former Neurosurgeon    Quit date: 05/10/1964  . Alcohol Use: 8.4 oz/week    14 Shots of liquor per week     2 mixed drinks daily  . Drug  Use: No  . Sexually Active: Not on file   Other Topics Concern  . Not on file   Social History Narrative  . No narrative on file    Past Surgical History  Procedure Date  . Tonsilectomy, adenoidectomy, bilateral myringotomy and tubes   . Knee arthroscopy     2 surgeries right and 1 left  . Prostate surgery 2/02    prostate cancer  . Uvulopalatopharyngoplasty   . Cardiac catheterization   . Tonsillectomy   . Esophagogastroduodenoscopy     with dilitation  . Lumbar laminectomy/decompression microdiscectomy 03/02/2011    Procedure: LUMBAR LAMINECTOMY/DECOMPRESSION MICRODISCECTOMY;  Surgeon: Javier Docker, MD;  Location: [facility];  Service: Orthopedics;  Laterality: N/A;  Decompression Lumbar 4 - Lumbar(X-Ray)  . Pacemaker insertion 06/2011    [employer] PPM implanted by Dr Royann Shivers    Family History  Problem Relation Age of Onset  . Heart attack Father   . Heart attack Brother   . Heart disease Maternal Uncle   . Heart attack Maternal Uncle   . Colon cancer Neg Hx     Allergies  Allergen Reactions  . Hydrocodone Other (See Comments)    Severe panic attacks  . Oxycodone Other (See Comments)    Severe panic attacks    Current Outpatient Prescriptions on File Prior to Visit  Medication Sig Dispense  Refill  . acetaminophen (TYLENOL) 325 MG tablet Take 650 mg by mouth every 6 (six) hours as needed. For pain      . amiodarone (PACERONE) 200 MG tablet Take 200 mg by mouth daily.       Marland Kitchen aspirin 81 MG tablet Take 81 mg by mouth daily.       . clopidogrel (PLAVIX) 75 MG tablet Take 75 mg by mouth daily.      Marland Kitchen LORazepam (ATIVAN) 0.5 MG tablet Take 0.5 mg by mouth every 8 (eight) hours.      . metoprolol succinate (TOPROL-XL) 25 MG 24 hr tablet Take by mouth. 1 tablet in the morning and 1/2 tablet at night      . NASONEX 50 MCG/ACT nasal [name] Place 1 [name] into both nostrils Once daily as needed. For allergies      . pantoprazole (PROTONIX) 40 MG tablet TAKE ONE TABLET  EVERY MORNING  30 tablet  5  . polyethylene glycol (MIRALAX / GLYCOLAX) packet Take 17 g by mouth daily as needed. For constipation      . potassium chloride (K-DUR) 10 MEQ tablet Take 2 tablets (20 mEq total) by mouth daily.  60 tablet  4  . ramipril (ALTACE) 5 MG tablet Take 5 mg by mouth daily.      Marland Kitchen zolpidem (AMBIEN) 5 MG tablet Take 5 mg by mouth at bedtime.       Current Facility-Administered Medications on File Prior to Visit  Medication Dose Route Frequency Provider Last Rate Last Dose  . levalbuterol (XOPENEX) nebulizer solution 0.63 mg  0.63 mg Nebulization Once Waymon Budge, MD         patient denies chest pain, shortness of breath, orthopnea. Denies lower extremity edema, abdominal pain, change in appetite, change in bowel movements. Patient denies rashes, musculoskeletal complaints. No other specific complaints in a complete review of systems.   BP 162/98  Pulse 64  Temp 97.6 F (36.4 C) (Oral)  Wt 184 lb (83.462 kg)  well-developed well-nourished male in no acute distress. HEENT exam atraumatic, normocephalic, neck supple without jugular venous distention. Chest clear to auscultation cardiac exam S1-S2 are regular. Abdominal exam overweight with bowel sounds, soft and nontender. Extremities no edema. Neurologic exam is alert with a normal gait. Repeat bp 133/77

## 2011-09-07 NOTE — Assessment & Plan Note (Signed)
Not as well controlled as it should be. I'll not change meds at this time as he has f/u with [place] [facility] in the next several days.

## 2011-09-07 NOTE — Assessment & Plan Note (Signed)
It is unclear whether his ventricular dysfunction is related to CAD

## 2011-09-12 ENCOUNTER — Encounter: Payer: Self-pay | Admitting: Internal Medicine

## 2011-09-12 ENCOUNTER — Ambulatory Visit (INDEPENDENT_AMBULATORY_CARE_PROVIDER_SITE_OTHER): Payer: Medicare Other | Admitting: Internal Medicine

## 2011-09-12 VITALS — BP 133/86 | HR 57 | Resp 18 | Ht 73.0 in | Wt 183.4 lb

## 2011-09-12 DIAGNOSIS — I5022 Chronic systolic (congestive) heart failure: Secondary | ICD-10-CM | POA: Diagnosis not present

## 2011-09-12 DIAGNOSIS — I4891 Unspecified atrial fibrillation: Secondary | ICD-10-CM

## 2011-09-12 DIAGNOSIS — R079 Chest pain, unspecified: Secondary | ICD-10-CM

## 2011-09-12 DIAGNOSIS — I495 Sick sinus syndrome: Secondary | ICD-10-CM

## 2011-09-12 LAB — PACEMAKER DEVICE OBSERVATION

## 2011-09-12 NOTE — Assessment & Plan Note (Signed)
Maintaining sinus rhythm with amiodarone. Given his advanced age, HTN, CAD, and prior TIA he is at increased risk for stroke.  He should ideally be anticoagulated with either coumadin or a novel anticoagulant.  I will defer this decision to Dr Alanda Amass.

## 2011-09-12 NOTE — Assessment & Plan Note (Signed)
QRS today is 116 msec.  I hope this means that he is clinically improved now that he is no longer having frequent PMT. He should have repeat echo by Dr Alanda Amass in 2-3 months to see if his EF improves. The patient wishes to avoid further procedures if possible. Presently, I think that we should continue to follow and see if his EF recovers.

## 2011-09-12 NOTE — Assessment & Plan Note (Signed)
Doing much better with DDIR pacing. Previously, he had PMT 15% of the time...   His symptoms appear much better presently, though he reports fatigue.  I have increased lower rate to 60 bpm today and adjusted minute ventilation to promote increase in sinus rate as needed.

## 2011-09-12 NOTE — Progress Notes (Signed)
PCP:  Judie Petit, MD Primary Cardiologist:  Dr Alanda Amass  The patient presents today for electrophysiology followup.  Since last being seen in our clinic, the patient reports doing reasonably well.  His tachypalpitations and sensation of breathlessness and "panic" have resolved with cessation of PMT.  He has stable fatigue.  He describes this as a "hallow" feeling.  Today, he denies symptoms of palpitations, chest pain, shortness of breath, orthopnea, PND, lower extremity edema, dizziness, presyncope, syncope, or neurologic sequela.  The patient feels that he is tolerating medications without difficulties and is otherwise without complaint today.   Past Medical History  Diagnosis Date  . Alcohol abuse, episodic 05/19/2008  . ALLERGIC RHINITIS 04/15/2009  . COLONIC POLYPS, ADENOMATOUS, HX OF 02/14/2008  . DIVERTICULOSIS, COLON 02/14/2008  . ESOPHAGEAL STRICTURE 01/28/2008  . GERD 02/14/2008  . HYPERGLYCEMIA 11/16/2006  . HYPERLIPIDEMIA 02/14/2008  . INSOMNIA-SLEEP DISORDER-UNSPEC 02/11/2009  . 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 PROSTATE CANCER, HX OF 02/25/2000  . TRANSIENT ISCHEMIC ATTACKS, HX OF 11/16/2006  . Arthritis   . Atrial fibrillation 11/16/2006    remote CHF related to atrial fib with rapid ventricular response over 25 yrs per office note,  . ASTHMA 11/16/2006    sinusitis-    hx ?yeast patch/white patch on vocal cord as per [facility] 02/25/11-   . OBSTRUCTIVE SLEEP APNEA 11/10/2008  . SLEEP APNEA 10/03/2009    LOV Dr Maple Hudson 12/12 in [facility]    Moderate per patient- settings ?6/last sleep study years ago  . HYPERTENSION 11/16/2006  . CORONARY ARTERY DISEASE 11/16/2006    nonischemic cardiomyapathy, last stress test 2011- LOV with clearance note Dr  Alanda Amass and EKG 1/13 on chart  . Symptomatic bradycardia, secondary to sinus node dysfunction 07/08/2011    s/p [employer] PPM by Dr Royann Shivers  . S/P cardiac pacemaker procedure, 07/08/11, [employer] 07/08/2011    Past Surgical History  Procedure Date  . Tonsilectomy, adenoidectomy, bilateral myringotomy and tubes   . Knee arthroscopy     2 surgeries right and 1 left  . Prostate surgery 2/02    prostate cancer  . Uvulopalatopharyngoplasty   . Cardiac catheterization   . Tonsillectomy   . Esophagogastroduodenoscopy     with dilitation  . Lumbar laminectomy/decompression microdiscectomy 03/02/2011    Procedure: LUMBAR LAMINECTOMY/DECOMPRESSION MICRODISCECTOMY;  Surgeon: Javier Docker, MD;  Location: [facility];  Service: Orthopedics;  Laterality: N/A;  Decompression Lumbar 4 - Lumbar(X-Ray)  . Pacemaker insertion 06/2011    [employer] PPM implanted by Dr Royann Shivers    Current Outpatient Prescriptions  Medication Sig Dispense Refill  . acetaminophen (TYLENOL) 325 MG tablet Take 650 mg by mouth every 6 (six) hours as needed. For pain      . amiodarone (PACERONE) 200 MG tablet Take 200 mg by mouth daily.       Marland Kitchen aspirin 81 MG tablet Take 81 mg by mouth daily.       . clopidogrel (PLAVIX) 75 MG tablet Take 75 mg by mouth daily.      Marland Kitchen LORazepam (ATIVAN) 0.5 MG tablet Take 0.5 mg by mouth every 8 (eight) hours.      . metoprolol succinate (TOPROL-XL) 25 MG 24 hr tablet Take by mouth. 1 tablet in the morning and 1/2 tablet at night      . NASONEX 50 MCG/ACT nasal spray Place 1 spray into both nostrils Once daily as needed. For allergies      . pantoprazole (PROTONIX) 40 MG tablet  TAKE ONE TABLET EVERY MORNING  30 tablet  5  . polyethylene glycol (MIRALAX / GLYCOLAX) packet Take 17 g by mouth daily as needed. For constipation      . potassium chloride (K-DUR) 10 MEQ tablet Take 2 tablets (20 mEq total) by mouth daily.  60 tablet  4  . ramipril (ALTACE) 5 MG tablet Take 5 mg by mouth daily.      Marland Kitchen zolpidem (AMBIEN) 5 MG tablet Take 5 mg by mouth at bedtime.       Current Facility-Administered Medications  Medication Dose Route Frequency Provider Last Rate Last Dose  . levalbuterol (XOPENEX)  nebulizer solution 0.63 mg  0.63 mg Nebulization Once Waymon Budge, MD        Allergies  Allergen Reactions  . Hydrocodone Other (See Comments)    Severe panic attacks  . Oxycodone Other (See Comments)    Severe panic attacks    History   Social History  . Marital Status: Married    Spouse Name: N/A    Number of Children: N/A  . Years of Education: N/A   Occupational History  . Not on file.   Social History Main Topics  . Smoking status: Former Smoker    Types: Cigarettes    Quit date: 03/22/1964  . Smokeless tobacco: Former Neurosurgeon    Quit date: 05/10/1964  . Alcohol Use: 8.4 oz/week    14 Shots of liquor per week     2 mixed drinks daily  . Drug Use: No  . Sexually Active: Not on file   Other Topics Concern  . Not on file   Social History Narrative  . No narrative on file    Family History  Problem Relation Age of Onset  . Heart attack Father   . Heart attack Brother   . Heart disease Maternal Uncle   . Heart attack Maternal Uncle   . Colon cancer Neg Hx     ROS-  All systems are reviewed and are negative except as outlined in the HPI above   Physical Exam: Filed Vitals:   09/12/11 1000  BP: 133/86  Pulse: 57  Resp: 18  Height: 6\' 1"  (1.854 m)  Weight: 183 lb 6.4 oz (83.19 kg)  SpO2: 97%    GEN- The patient is well appearing, alert and oriented x 3 today.   Head- normocephalic, atraumatic Eyes-  Sclera clear, conjunctiva pink Ears- hearing intact Oropharynx- clear Neck- supple,   Lungs- Clear to ausculation bilaterally, normal work of breathing Chest- pacemaker pocket is well healed Heart- Regular rate and rhythm, no murmurs, rubs or gallops, PMI not laterally displaced GI- soft, NT, ND, + BS Extremities- no clubbing, cyanosis, or edema MS- no significant deformity or atrophy Skin- no rash or lesion Psych- euthymic mood, full affect Neuro- strength and sensation are intact  Pacemaker interrogation- reviewed in detail today,  See  PACEART report ekg today reveals atrial pacing at 55 bpm, PR 204, QRS 116, Qtc 474, nonspecific ST/T changes  Assessment and Plan:

## 2011-09-12 NOTE — Patient Instructions (Addendum)
Your physician recommends that you schedule a follow-up appointment in: 2 months  

## 2011-09-15 DIAGNOSIS — I4891 Unspecified atrial fibrillation: Secondary | ICD-10-CM | POA: Diagnosis not present

## 2011-09-15 DIAGNOSIS — I447 Left bundle-branch block, unspecified: Secondary | ICD-10-CM | POA: Diagnosis not present

## 2011-09-15 DIAGNOSIS — I509 Heart failure, unspecified: Secondary | ICD-10-CM | POA: Diagnosis not present

## 2011-09-19 ENCOUNTER — Ambulatory Visit (INDEPENDENT_AMBULATORY_CARE_PROVIDER_SITE_OTHER): Payer: Medicare Other

## 2011-09-19 ENCOUNTER — Ambulatory Visit: Payer: Medicare Other | Admitting: Internal Medicine

## 2011-09-19 DIAGNOSIS — J309 Allergic rhinitis, unspecified: Secondary | ICD-10-CM

## 2011-09-20 DIAGNOSIS — Z7901 Long term (current) use of anticoagulants: Secondary | ICD-10-CM | POA: Diagnosis not present

## 2011-09-20 DIAGNOSIS — I4891 Unspecified atrial fibrillation: Secondary | ICD-10-CM | POA: Diagnosis not present

## 2011-09-21 ENCOUNTER — Other Ambulatory Visit: Payer: Self-pay | Admitting: Internal Medicine

## 2011-09-22 DIAGNOSIS — Z9889 Other specified postprocedural states: Secondary | ICD-10-CM | POA: Diagnosis not present

## 2011-09-22 DIAGNOSIS — C61 Malignant neoplasm of prostate: Secondary | ICD-10-CM | POA: Diagnosis not present

## 2011-09-23 ENCOUNTER — Institutional Professional Consult (permissible substitution): Payer: Medicare Other | Admitting: Internal Medicine

## 2011-09-28 DIAGNOSIS — R5381 Other malaise: Secondary | ICD-10-CM | POA: Diagnosis not present

## 2011-09-28 DIAGNOSIS — R5383 Other fatigue: Secondary | ICD-10-CM | POA: Diagnosis not present

## 2011-09-28 DIAGNOSIS — Z7901 Long term (current) use of anticoagulants: Secondary | ICD-10-CM | POA: Diagnosis not present

## 2011-09-28 DIAGNOSIS — R0602 Shortness of breath: Secondary | ICD-10-CM | POA: Diagnosis not present

## 2011-09-28 DIAGNOSIS — I4891 Unspecified atrial fibrillation: Secondary | ICD-10-CM | POA: Diagnosis not present

## 2011-09-28 DIAGNOSIS — R6889 Other general symptoms and signs: Secondary | ICD-10-CM | POA: Diagnosis not present

## 2011-09-29 ENCOUNTER — Ambulatory Visit (INDEPENDENT_AMBULATORY_CARE_PROVIDER_SITE_OTHER): Payer: Medicare Other

## 2011-09-29 DIAGNOSIS — J309 Allergic rhinitis, unspecified: Secondary | ICD-10-CM | POA: Diagnosis not present

## 2011-09-29 DIAGNOSIS — C61 Malignant neoplasm of prostate: Secondary | ICD-10-CM | POA: Diagnosis not present

## 2011-10-03 ENCOUNTER — Encounter: Payer: Medicare Other | Admitting: Internal Medicine

## 2011-10-05 ENCOUNTER — Ambulatory Visit (INDEPENDENT_AMBULATORY_CARE_PROVIDER_SITE_OTHER): Payer: Medicare Other

## 2011-10-05 DIAGNOSIS — I4891 Unspecified atrial fibrillation: Secondary | ICD-10-CM | POA: Diagnosis not present

## 2011-10-05 DIAGNOSIS — Z7901 Long term (current) use of anticoagulants: Secondary | ICD-10-CM | POA: Diagnosis not present

## 2011-10-05 DIAGNOSIS — J309 Allergic rhinitis, unspecified: Secondary | ICD-10-CM

## 2011-10-12 ENCOUNTER — Ambulatory Visit (INDEPENDENT_AMBULATORY_CARE_PROVIDER_SITE_OTHER): Payer: Medicare Other

## 2011-10-12 DIAGNOSIS — J309 Allergic rhinitis, unspecified: Secondary | ICD-10-CM

## 2011-10-14 ENCOUNTER — Encounter: Payer: Self-pay | Admitting: Internal Medicine

## 2011-10-19 ENCOUNTER — Ambulatory Visit (INDEPENDENT_AMBULATORY_CARE_PROVIDER_SITE_OTHER): Payer: Medicare Other

## 2011-10-19 DIAGNOSIS — I4891 Unspecified atrial fibrillation: Secondary | ICD-10-CM | POA: Diagnosis not present

## 2011-10-19 DIAGNOSIS — Z7901 Long term (current) use of anticoagulants: Secondary | ICD-10-CM | POA: Diagnosis not present

## 2011-10-19 DIAGNOSIS — J309 Allergic rhinitis, unspecified: Secondary | ICD-10-CM | POA: Diagnosis not present

## 2011-10-26 ENCOUNTER — Telehealth: Payer: Self-pay | Admitting: Internal Medicine

## 2011-10-26 NOTE — Telephone Encounter (Signed)
Last seen 09/07/11.  No future appt scheduled.  Please advise.  Thanks!!!

## 2011-10-26 NOTE — Telephone Encounter (Signed)
Pt needs refill on generic ambien 5 mg call into [facility] [phone]

## 2011-10-27 ENCOUNTER — Ambulatory Visit (INDEPENDENT_AMBULATORY_CARE_PROVIDER_SITE_OTHER): Payer: Medicare Other

## 2011-10-27 ENCOUNTER — Telehealth: Payer: Self-pay | Admitting: Internal Medicine

## 2011-10-27 DIAGNOSIS — R5381 Other malaise: Secondary | ICD-10-CM | POA: Diagnosis not present

## 2011-10-27 DIAGNOSIS — R6889 Other general symptoms and signs: Secondary | ICD-10-CM | POA: Diagnosis not present

## 2011-10-27 DIAGNOSIS — J309 Allergic rhinitis, unspecified: Secondary | ICD-10-CM | POA: Diagnosis not present

## 2011-10-27 DIAGNOSIS — I447 Left bundle-branch block, unspecified: Secondary | ICD-10-CM | POA: Diagnosis not present

## 2011-10-27 DIAGNOSIS — I5022 Chronic systolic (congestive) heart failure: Secondary | ICD-10-CM | POA: Diagnosis not present

## 2011-10-27 DIAGNOSIS — R0602 Shortness of breath: Secondary | ICD-10-CM | POA: Diagnosis not present

## 2011-10-27 DIAGNOSIS — I495 Sick sinus syndrome: Secondary | ICD-10-CM | POA: Diagnosis not present

## 2011-10-27 NOTE — Telephone Encounter (Signed)
Pt needs refill on generic ambien 5 mg call into [facility] [phone]. Pls notify pt when this has been called in today. Pt is completely out of med and has been for a few days.

## 2011-10-28 ENCOUNTER — Other Ambulatory Visit: Payer: Self-pay | Admitting: *Deleted

## 2011-10-28 ENCOUNTER — Ambulatory Visit (INDEPENDENT_AMBULATORY_CARE_PROVIDER_SITE_OTHER): Payer: Medicare Other

## 2011-10-28 DIAGNOSIS — J309 Allergic rhinitis, unspecified: Secondary | ICD-10-CM | POA: Diagnosis not present

## 2011-10-28 MED ORDER — ZOLPIDEM TARTRATE 5 MG PO TABS: 5.0000 mg | ORAL_TABLET | Freq: Every day | ORAL | Status: DC

## 2011-10-28 NOTE — Telephone Encounter (Signed)
Called and lft pt vm that med has been called in as noted.

## 2011-10-28 NOTE — Telephone Encounter (Signed)
Called in-please  Let pt know

## 2011-10-31 ENCOUNTER — Encounter (HOSPITAL_COMMUNITY)
Admission: RE | Admit: 2011-10-31 | Discharge: 2011-10-31 | Disposition: A | Discharge status: Home / Self Care | Payer: Self-pay | Source: Ambulatory Visit | Attending: Cardiovascular Disease | Admitting: Cardiovascular Disease

## 2011-10-31 DIAGNOSIS — I10 Essential (primary) hypertension: Secondary | ICD-10-CM | POA: Insufficient documentation

## 2011-10-31 DIAGNOSIS — Z950 Presence of cardiac pacemaker: Secondary | ICD-10-CM | POA: Insufficient documentation

## 2011-10-31 DIAGNOSIS — K219 Gastro-esophageal reflux disease without esophagitis: Secondary | ICD-10-CM | POA: Insufficient documentation

## 2011-10-31 DIAGNOSIS — I252 Old myocardial infarction: Secondary | ICD-10-CM | POA: Insufficient documentation

## 2011-10-31 DIAGNOSIS — I495 Sick sinus syndrome: Secondary | ICD-10-CM | POA: Insufficient documentation

## 2011-10-31 DIAGNOSIS — J45909 Unspecified asthma, uncomplicated: Secondary | ICD-10-CM | POA: Insufficient documentation

## 2011-10-31 DIAGNOSIS — I2510 Atherosclerotic heart disease of native coronary artery without angina pectoris: Secondary | ICD-10-CM | POA: Insufficient documentation

## 2011-10-31 DIAGNOSIS — E785 Hyperlipidemia, unspecified: Secondary | ICD-10-CM | POA: Insufficient documentation

## 2011-10-31 DIAGNOSIS — Z8546 Personal history of malignant neoplasm of prostate: Secondary | ICD-10-CM | POA: Insufficient documentation

## 2011-10-31 DIAGNOSIS — I4891 Unspecified atrial fibrillation: Secondary | ICD-10-CM | POA: Insufficient documentation

## 2011-10-31 DIAGNOSIS — Z5189 Encounter for other specified aftercare: Secondary | ICD-10-CM | POA: Insufficient documentation

## 2011-10-31 DIAGNOSIS — I5022 Chronic systolic (congestive) heart failure: Secondary | ICD-10-CM | POA: Insufficient documentation

## 2011-10-31 DIAGNOSIS — F1010 Alcohol abuse, uncomplicated: Secondary | ICD-10-CM | POA: Insufficient documentation

## 2011-10-31 DIAGNOSIS — Z8673 Personal history of transient ischemic attack (TIA), and cerebral infarction without residual deficits: Secondary | ICD-10-CM | POA: Insufficient documentation

## 2011-11-01 ENCOUNTER — Telehealth: Payer: Self-pay | Admitting: Internal Medicine

## 2011-11-01 ENCOUNTER — Encounter: Payer: Self-pay | Admitting: *Deleted

## 2011-11-01 DIAGNOSIS — Z950 Presence of cardiac pacemaker: Secondary | ICD-10-CM | POA: Insufficient documentation

## 2011-11-01 NOTE — Telephone Encounter (Signed)
Caller: [name]/Patient; Patient Name: Corey [name]; PCP: Birdie Sons (Adults only); [facility] Callback Phone Number: [phone];  Back pain that started 11/01/11 this am getting ready for work, worse as the day progressed, left side of back and hurts to take a deep breath. Emergent signs and symtpoms ruled out as per Back Symptoms protocol except for see in ED due to nursing judgement and pt compliant.

## 2011-11-02 ENCOUNTER — Telehealth: Payer: Self-pay | Admitting: Internal Medicine

## 2011-11-02 ENCOUNTER — Ambulatory Visit (INDEPENDENT_AMBULATORY_CARE_PROVIDER_SITE_OTHER)
Admission: RE | Admit: 2011-11-02 | Discharge: 2011-11-02 | Disposition: A | Discharge status: Home / Self Care | Payer: Medicare Other | Source: Ambulatory Visit | Attending: Family Medicine | Admitting: Family Medicine

## 2011-11-02 ENCOUNTER — Encounter: Payer: Self-pay | Admitting: Family Medicine

## 2011-11-02 ENCOUNTER — Ambulatory Visit (INDEPENDENT_AMBULATORY_CARE_PROVIDER_SITE_OTHER): Payer: Medicare Other | Admitting: Family Medicine

## 2011-11-02 ENCOUNTER — Ambulatory Visit (INDEPENDENT_AMBULATORY_CARE_PROVIDER_SITE_OTHER): Payer: Medicare Other

## 2011-11-02 ENCOUNTER — Encounter (HOSPITAL_COMMUNITY)
Admission: RE | Admit: 2011-11-02 | Discharge: 2011-11-02 | Disposition: A | Discharge status: Home / Self Care | Payer: Self-pay | Source: Ambulatory Visit | Attending: Cardiovascular Disease | Admitting: Cardiovascular Disease

## 2011-11-02 VITALS — BP 120/72 | HR 70 | Temp 97.8°F | Wt 189.0 lb

## 2011-11-02 DIAGNOSIS — R071 Chest pain on breathing: Secondary | ICD-10-CM | POA: Diagnosis not present

## 2011-11-02 DIAGNOSIS — R0781 Pleurodynia: Secondary | ICD-10-CM

## 2011-11-02 DIAGNOSIS — J309 Allergic rhinitis, unspecified: Secondary | ICD-10-CM | POA: Diagnosis not present

## 2011-11-02 DIAGNOSIS — I4891 Unspecified atrial fibrillation: Secondary | ICD-10-CM | POA: Diagnosis not present

## 2011-11-02 DIAGNOSIS — Z7901 Long term (current) use of anticoagulants: Secondary | ICD-10-CM | POA: Diagnosis not present

## 2011-11-02 NOTE — Telephone Encounter (Signed)
appt scheduled with Dr Caryl Never at 3:30

## 2011-11-02 NOTE — Telephone Encounter (Signed)
Phyllis from [facility] cardiac rehab calling to report pt is experiencing severe back pain.  Pt scores the pain 8/10 and it does not appear to be muscular pain.  Pt will not do any exercises while at rehab today.  Please advise what pt should do for treatment.

## 2011-11-02 NOTE — Progress Notes (Signed)
Subjective:    Patient ID: Corey [name], male    DOB: 1935/04/16, 76 y.o.   MRN: [id number]  HPI  Patient seen as a work in with pleuritic pain left chest region. Patient was in cardiac rehabilitation today and sent for further evaluation. Onset yesterday of pain somewhat poorly localized left thoracic region. Deep pain described as sharp and somewhat intermittent. Worse with deep breath. No cough. No fever. No dyspnea. No exertional symptoms. No injury. No hemoptysis. He is on Coumadin with reported INR 2.9 earlier today. No history of DVT. Pain is actually slightly better today compared to yesterday. Pain is not reproducible with movement or touch  Patient has multiple chronic problems including history of CAD, atrial fibrillation, esophageal stricture, GERD, remote history of prostate cancer, history of lumbar stenosis, chronic systolic heart failure, history of symptomatic bradycardia secondary to sinus node dysfunction. He has pacemaker.  Past Medical History  Diagnosis Date  . Alcohol abuse, episodic 05/19/2008  . ALLERGIC RHINITIS 04/15/2009  . COLONIC POLYPS, ADENOMATOUS, HX OF 02/14/2008  . DIVERTICULOSIS, COLON 02/14/2008  . ESOPHAGEAL STRICTURE 01/28/2008  . GERD 02/14/2008  . HYPERGLYCEMIA 11/16/2006  . HYPERLIPIDEMIA 02/14/2008  . INSOMNIA-SLEEP DISORDER-UNSPEC 02/11/2009  . 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 PROSTATE CANCER, HX OF 02/25/2000  . TRANSIENT ISCHEMIC ATTACKS, HX OF 11/16/2006  . Arthritis   . Atrial fibrillation 11/16/2006    remote CHF related to atrial fib with rapid ventricular response over 25 yrs per office note,  . ASTHMA 11/16/2006    sinusitis-    hx ?yeast patch/white patch on vocal cord as per [facility] 02/25/11-   . OBSTRUCTIVE SLEEP APNEA 11/10/2008  . SLEEP APNEA 10/03/2009    LOV Dr Maple Hudson 12/12 in [facility]    Moderate per patient- settings ?6/last sleep study years ago  . HYPERTENSION 11/16/2006  . CORONARY ARTERY DISEASE 11/16/2006    nonischemic  cardiomyapathy, last stress test 2011- LOV with clearance note Dr  Alanda Amass and EKG 1/13 on chart  . Symptomatic bradycardia, secondary to sinus node dysfunction 07/08/2011    s/p [employer] PPM by Dr Royann Shivers  . S/P cardiac pacemaker procedure, 07/08/11, [employer] 07/08/2011   Past Surgical History  Procedure Date  . Tonsilectomy, adenoidectomy, bilateral myringotomy and tubes   . Knee arthroscopy     2 surgeries right and 1 left  . Prostate surgery 2/02    prostate cancer  . Uvulopalatopharyngoplasty   . Cardiac catheterization   . Tonsillectomy   . Esophagogastroduodenoscopy     with dilitation  . Lumbar laminectomy/decompression microdiscectomy 03/02/2011    Procedure: LUMBAR LAMINECTOMY/DECOMPRESSION MICRODISCECTOMY;  Surgeon: Javier Docker, MD;  Location: [facility];  Service: Orthopedics;  Laterality: N/A;  Decompression Lumbar 4 - Lumbar(X-Ray)  . Pacemaker insertion 06/2011    [employer] PPM implanted by Dr Royann Shivers    reports that he quit smoking about 47 years ago. His smoking use included Cigarettes. He quit smokeless tobacco use about 47 years ago. He reports that he drinks about 8.4 ounces of alcohol per week. He reports that he does not use illicit drugs. family history includes Heart attack in his brother, father, and maternal uncle and Heart disease in his maternal uncle.  There is no history of Colon cancer. Allergies  Allergen Reactions  . Hydrocodone Other (See Comments)    Severe panic attacks  . Oxycodone Other (See Comments)    Severe panic attacks      Review of Systems  Constitutional: Negative for  fever and chills.  Respiratory: Negative for cough and shortness of breath.   Cardiovascular: Negative for palpitations and leg swelling.  Gastrointestinal: Negative for abdominal pain.  Neurological: Negative for dizziness and syncope.       Objective:   Physical Exam  Constitutional: He appears well-developed and well-nourished.    Neck: Neck supple. No thyromegaly present.  Cardiovascular: Normal rate and regular rhythm.   Pulmonary/Chest: Effort normal and breath sounds normal. No respiratory distress. He has no wheezes. He has no rales.  Musculoskeletal: He exhibits no edema.       No spinal or muscular tenderness back region.  Skin: No rash noted.          Assessment & Plan:  Left thoracic pleuritic type pain. Question musculoskeletal. Patient on Coumadin for history of atrial fibrillation with therapeutic INR-earlier today. This would make pulmonary embolus very unlikely. Patient has normal pulse oximetry 97%. Pain is nonreproducible. Obtain chest x-ray. Has had previous intolerance to opioids and wishes to avoid additional meds at this time.

## 2011-11-02 NOTE — Patient Instructions (Addendum)
Follow up immediately for any shortness of breath, chest pain, or worsening pleuritic pain.

## 2011-11-02 NOTE — Progress Notes (Signed)
Corey [name] came in for exercise today, c/o pain in upper back , feels like a knife., yesterday 8/10 pain scale, today 6/10.  He is tender to touch in this area also.  Advised no exercise, follow up with Primary MD.  Appointment made with Dr. Caryl Never at 3:45 today.

## 2011-11-03 ENCOUNTER — Ambulatory Visit (INDEPENDENT_AMBULATORY_CARE_PROVIDER_SITE_OTHER): Payer: Medicare Other | Admitting: Internal Medicine

## 2011-11-03 VITALS — BP 122/76 | HR 66 | Wt 189.0 lb

## 2011-11-03 DIAGNOSIS — J309 Allergic rhinitis, unspecified: Secondary | ICD-10-CM

## 2011-11-03 DIAGNOSIS — G4733 Obstructive sleep apnea (adult) (pediatric): Secondary | ICD-10-CM

## 2011-11-03 DIAGNOSIS — J45909 Unspecified asthma, uncomplicated: Secondary | ICD-10-CM

## 2011-11-03 NOTE — Progress Notes (Signed)
Quick Note:  Pt informed ______ 

## 2011-11-04 ENCOUNTER — Encounter (HOSPITAL_COMMUNITY): Payer: Self-pay

## 2011-11-07 ENCOUNTER — Encounter (HOSPITAL_COMMUNITY): Payer: Self-pay

## 2011-11-07 ENCOUNTER — Encounter: Payer: Self-pay | Admitting: Internal Medicine

## 2011-11-07 ENCOUNTER — Ambulatory Visit (INDEPENDENT_AMBULATORY_CARE_PROVIDER_SITE_OTHER): Payer: Medicare Other | Admitting: Internal Medicine

## 2011-11-07 VITALS — BP 140/70 | HR 67 | Ht 73.0 in | Wt 185.0 lb

## 2011-11-07 DIAGNOSIS — R001 Bradycardia, unspecified: Secondary | ICD-10-CM

## 2011-11-07 DIAGNOSIS — I4891 Unspecified atrial fibrillation: Secondary | ICD-10-CM

## 2011-11-07 DIAGNOSIS — I5022 Chronic systolic (congestive) heart failure: Secondary | ICD-10-CM | POA: Diagnosis not present

## 2011-11-07 DIAGNOSIS — I498 Other specified cardiac arrhythmias: Secondary | ICD-10-CM

## 2011-11-07 DIAGNOSIS — I495 Sick sinus syndrome: Secondary | ICD-10-CM | POA: Diagnosis not present

## 2011-11-07 LAB — PACEMAKER DEVICE OBSERVATION
AL AMPLITUDE: 3.7 mv
AL THRESHOLD: 0.7 V
DEVICE MODEL PM: 111944
RV LEAD AMPLITUDE: 20.8 mv
RV LEAD THRESHOLD: 0.9 V
VENTRICULAR PACING PM: 6

## 2011-11-07 NOTE — Patient Instructions (Signed)
Your physician recommends that you schedule a follow-up appointment as needed  

## 2011-11-07 NOTE — Progress Notes (Signed)
PCP:  Judie Petit, MD Primary Cardiologist:  Dr Alanda Amass  The patient presents today for electrophysiology followup.  Since last being seen in our clinic, the patient reports doing reasonably well.  His tachypalpitations and sensation of breathlessness and "panic" have resolved with cessation of PMT.  It appears that his exercise tolerance is improving.  Today, he denies symptoms of palpitations, chest pain, shortness of breath, orthopnea, PND, lower extremity edema, dizziness, presyncope, syncope, or neurologic sequela.  The patient feels that he is tolerating medications without difficulties and is otherwise without complaint today.   Past Medical History  Diagnosis Date  . Alcohol abuse, episodic 05/19/2008  . ALLERGIC RHINITIS 04/15/2009  . COLONIC POLYPS, ADENOMATOUS, HX OF 02/14/2008  . DIVERTICULOSIS, COLON 02/14/2008  . ESOPHAGEAL STRICTURE 01/28/2008  . GERD 02/14/2008  . HYPERGLYCEMIA 11/16/2006  . HYPERLIPIDEMIA 02/14/2008  . INSOMNIA-SLEEP DISORDER-UNSPEC 02/11/2009  . 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 PROSTATE CANCER, HX OF 02/25/2000  . TRANSIENT ISCHEMIC ATTACKS, HX OF 11/16/2006  . Arthritis   . Atrial fibrillation 11/16/2006    remote CHF related to atrial fib with rapid ventricular response over 25 yrs per office note,  . ASTHMA 11/16/2006    sinusitis-    hx ?yeast patch/white patch on vocal cord as per [facility] 02/25/11-   . OBSTRUCTIVE SLEEP APNEA 11/10/2008  . SLEEP APNEA 10/03/2009    LOV Dr Maple Hudson 12/12 in [facility]    Moderate per patient- settings ?6/last sleep study years ago  . HYPERTENSION 11/16/2006  . CORONARY ARTERY DISEASE 11/16/2006    nonischemic cardiomyapathy, last stress test 2011- LOV with clearance note Dr  Alanda Amass and EKG 1/13 on chart  . Symptomatic bradycardia, secondary to sinus node dysfunction 07/08/2011    s/p [employer] PPM by Dr Royann Shivers  . S/P cardiac pacemaker procedure, 07/08/11, [employer] 07/08/2011   Past Surgical  History  Procedure Date  . Tonsilectomy, adenoidectomy, bilateral myringotomy and tubes   . Knee arthroscopy     2 surgeries right and 1 left  . Prostate surgery 2/02    prostate cancer  . Uvulopalatopharyngoplasty   . Cardiac catheterization   . Tonsillectomy   . Esophagogastroduodenoscopy     with dilitation  . Lumbar laminectomy/decompression microdiscectomy 03/02/2011    Procedure: LUMBAR LAMINECTOMY/DECOMPRESSION MICRODISCECTOMY;  Surgeon: Javier Docker, MD;  Location: [facility];  Service: Orthopedics;  Laterality: N/A;  Decompression Lumbar 4 - Lumbar(X-Ray)  . Pacemaker insertion 06/2011    [employer] PPM implanted by Dr Royann Shivers    Current Outpatient Prescriptions  Medication Sig Dispense Refill  . Acetaminophen 500 MG TBDP Take by mouth as needed.      Marland Kitchen amiodarone (PACERONE) 200 MG tablet Take 200 mg by mouth daily.       Marland Kitchen aspirin 81 MG tablet Take 81 mg by mouth daily.       Marland Kitchen LORazepam (ATIVAN) 0.5 MG tablet Take 0.5 mg by mouth every 8 (eight) hours.      . metoprolol succinate (TOPROL-XL) 25 MG 24 hr tablet Take 25 mg by mouth. 1 tablet in the morning and 1 tablet at night      . NASONEX 50 MCG/ACT nasal spray TWO SPRAYS IN EACH NOSTRIL ONCE DAILY  17 g  2  . pantoprazole (PROTONIX) 40 MG tablet TAKE ONE TABLET EVERY MORNING  30 tablet  5  . polyethylene glycol (MIRALAX / GLYCOLAX) packet Take 17 g by mouth daily as needed. For constipation      .  potassium chloride (K-DUR) 10 MEQ tablet Take 2 tablets (20 mEq total) by mouth daily.  60 tablet  4  . ramipril (ALTACE) 10 MG tablet Take 10 mg by mouth daily.      Marland Kitchen spironolactone (ALDACTONE) 25 MG tablet Take 25 mg by mouth daily.       Marland Kitchen zolpidem (AMBIEN) 5 MG tablet Take 1 tablet (5 mg total) by mouth at bedtime.  30 tablet  2   Current Facility-Administered Medications  Medication Dose Route Frequency Provider Last Rate Last Dose  . levalbuterol (XOPENEX) nebulizer solution 0.63 mg  0.63 mg Nebulization Once  Waymon Budge, MD        Allergies  Allergen Reactions  . Hydrocodone Other (See Comments)    Severe panic attacks  . Oxycodone Other (See Comments)    Severe panic attacks    History   Social History  . Marital Status: Married    Spouse Name: N/A    Number of Children: N/A  . Years of Education: N/A   Occupational History  . Not on file.   Social History Main Topics  . Smoking status: Former Smoker    Types: Cigarettes    Quit date: 03/22/1964  . Smokeless tobacco: Former Neurosurgeon    Quit date: 05/10/1964  . Alcohol Use: 8.4 oz/week    14 Shots of liquor per week     2 mixed drinks daily  . Drug Use: No  . Sexually Active: Not on file   Other Topics Concern  . Not on file   Social History Narrative  . No narrative on file    Family History  Problem Relation Age of Onset  . Heart attack Father   . Heart attack Brother   . Heart disease Maternal Uncle   . Heart attack Maternal Uncle   . Colon cancer Neg Hx     Physical Exam: Filed Vitals:   11/07/11 1125  BP: 140/70  Pulse: 67  Height: 6\' 1"  (1.854 m)  Weight: 185 lb (83.915 kg)    GEN- The patient is well appearing, alert and oriented x 3 today.   Head- normocephalic, atraumatic Eyes-  Sclera clear, conjunctiva pink Ears- hearing intact Oropharynx- clear Neck- supple,   Lungs- Clear to ausculation bilaterally, normal work of breathing Chest- pacemaker pocket is well healed Heart- Regular rate and rhythm, no murmurs, rubs or gallops, PMI not laterally displaced GI- soft, NT, ND, + BS Extremities- no clubbing, cyanosis, or edema MS- no significant deformity or atrophy Skin- no rash or lesion Psych- euthymic mood, full affect Neuro- strength and sensation are intact  Pacemaker interrogation- reviewed in detail today,  See PACEART report ekg today reveals atrial pacing at 67 bpm, PR 176, QRS 160, Qtc 515, LBBB  Assessment and Plan:

## 2011-11-07 NOTE — Assessment & Plan Note (Signed)
Clinically class II/III at this point.  He has an echo scheduled with Dr Alanda Amass in the next few days. He has an intermittent LBBB (today he has LBBB with QRS ) though on last visit, his QRS was quite narrow.  The patient wishes to avoid further procedures if possible.  If his EF remains <35% then we should consider upgrade to a CRT device (either CRT-P or CRT-D).  If his EF has improved to >35% then medical therapy would be appropriate long term.  He will follow up with Dr Alanda Amass who is aware of this plan.  Should they feel that upgrade is necessary going forward, then I am happy to assist.  I will see him as needed in the interim.

## 2011-11-08 DIAGNOSIS — I4891 Unspecified atrial fibrillation: Secondary | ICD-10-CM | POA: Diagnosis not present

## 2011-11-09 ENCOUNTER — Encounter (HOSPITAL_COMMUNITY): Payer: Self-pay

## 2011-11-09 ENCOUNTER — Encounter: Payer: Self-pay | Admitting: Internal Medicine

## 2011-11-09 NOTE — Assessment & Plan Note (Signed)
Controlled. I think most of his dyspnea with exertion his cardiac.

## 2011-11-09 NOTE — Progress Notes (Signed)
11/01/10-76 year old male with remote smoking history, followed for chronic asthmatic bronchitis/bronchiectasis, history of sinusitis, allergic rhinitis, sleep apnea Last here 03/15/10 He wants to wait until November for flu shot. He has not had recent or acute symptoms related to his breathing and has not used Advair in 6 months. He didn't find it helpful. Rare use of Proair, maybe once every 3-4 months. He continues allergy vaccine, missing a dose sometimes if he is away from town. He notices no problems and offers no questions. He continues to use CPAP all night every night. He had had a palatoplasty he doesn't remember when and wonders if this was done at the time of his tonsillectomy as a Coley Kulikowski child. He notices persistent narrowness through the right nostril related to his septal deviation. Nasonex helps. We discussed a trial of Breathe Right strips to be used under his CPAP mask. He can feel when his atrial fibrillation is more active, as in the past 2 days. He treats his difficulty initiating and maintaining sleep with a one half of a 5 mg Ambien tablet when needed.   11/01/10-76 year old male with remote smoking history, followed for chronic asthmatic bronchitis/bronchiectasis, history of sinusitis, allergic rhinitis, sleep apnea Walked in today as an acute unscheduled visit complaining of cough since lunchtime. He got a throat tickle walking to his car and got gradually worse. Persistent for 4 hours with chest congestion. Chronic feeling of raspy throat clearing. Orthopedic knee surgery one month ago/Dr. Shelle Iron. Oxycodone given in hospital cause shaking, insomnia, panic attack. When he got home he took some leftover hydrocodone and had the same experience, up all night, pacing and anxious. This is set off his chronic anxiety. Dr Shelle Iron called in lorazepam- helps. Continues CPAP all night every night.  05/16/11- 76 year old male with remote smoking history, followed for chronic asthmatic  bronchitis/bronchiectasis, history of sinusitis, allergic rhinitis, sleep apnea, AFib/ CAD/ MI, GERD He admits his anxiety is getting to him. He had a benign lesion biopsied on his vocal cord at [facility]. Apparently he was put to sleep with intention to remove the lesion but they found they could not. He is left persistently hoarse.  He had just had back surgery 8 weeks previously and has been taking oxycodone for back pain. Lorazepam helps him sleep at night.  CPAP continues all night every night with good control. 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  11/03/11- 76 year old male with remote smoking history, followed for chronic asthmatic bronchitis/bronchiectasis, history of sinusitis, allergic rhinitis, sleep apnea, AFib/ CAD/ MI, GERD He wants to wait until November for flu shot. Continues allergy vaccine 1:10 GH. He has not had much respiratory problem until the last one or 2 weeks when he began throat clearing. Has used his rescue inhaler only once or twice. In February he had spine surgery for spinal stenosis. Oxycodone caused panic attack lasting 3 months. Then had a vocal cord biopsy at [facility]. Exacerbation of atrial fibrillation treated with pacemaker in June, then amiodarone. He still feels more short of breath than normal but is starting cardiac rehabilitation. CPAP/ Advanced.  ROS-see HPi Constitutional:   No-   weight loss, night sweats, fevers, chills, fatigue, lassitude. HEENT:   No-  headaches, difficulty swallowing, tooth/dental problems, sore throat,       No-  sneezing, itching, ear ache, nasal congestion, post nasal drip,  CV:  No-   chest pain, orthopnea,  PND, swelling in lower extremities, anasarca, dizziness, palpitations Resp: No-   shortness of breath with exertion or at rest.              No-   productive cough,  + non-productive cough,  No-  coughing up of blood.               No-   change in color of mucus.  No- wheezing.   Skin: No-   rash or lesions. GI:  No-   heartburn, indigestion, abdominal pain, nausea, vomiting, GU: No-   . MS:  No-   joint pain or swelling.   Neuro- Nothing unusual Psych:  + change in mood or affect. No depression, + anxiety.  No memory loss.  OBJ General- Alert, Oriented, Affect-anxious, Distress- anxious Skin- rash-none, lesions- none, excoriation- none Lymphadenopathy- none Head- atraumatic            Eyes- Gross vision intact, PERRLA, conjunctivae clear secretions            Ears- Hearing, canals-normal            Nose- Clear, ?Septal dev -narrower on the right, no-  mucus, polyps, erosion, perforation             Throat- Mallampati II/ s/p UPPP , mucosa clear , drainage- none, tonsils- atrophic. Very hoarse Neck- flexible , trachea midline, no stridor , thyroid nl, carotid no bruit Chest - symmetrical excursion , unlabored           Heart/CV- RRR , no murmur , no gallop  , no rub, nl s1 s2                           - JVD- none , edema- none, stasis changes- none, varices- none           Lung- clear to P&A, wheeze- none, cough- none , dullness-none, rub- none           Chest wall-  Abd-  Br/ Gen/ Rectal- Not done, not indicated Extrem- cyanosis- none, clubbing, none, atrophy- none, strength- nl Neuro- +tremor, I notice repeated facial grimacing that almost suggests a tic, but it is variable.

## 2011-11-09 NOTE — Patient Instructions (Signed)
We can continue allergy vaccine  We can continue CPAP  Please call as needed

## 2011-11-09 NOTE — Assessment & Plan Note (Signed)
Continued good compliance and control 

## 2011-11-09 NOTE — Assessment & Plan Note (Signed)
He continues allergy vaccine without concerns.

## 2011-11-10 ENCOUNTER — Ambulatory Visit (INDEPENDENT_AMBULATORY_CARE_PROVIDER_SITE_OTHER): Payer: Medicare Other

## 2011-11-10 DIAGNOSIS — J309 Allergic rhinitis, unspecified: Secondary | ICD-10-CM

## 2011-11-11 ENCOUNTER — Encounter (HOSPITAL_COMMUNITY): Payer: Self-pay

## 2011-11-14 ENCOUNTER — Encounter (HOSPITAL_COMMUNITY)
Admission: RE | Admit: 2011-11-14 | Discharge: 2011-11-14 | Disposition: A | Discharge status: Home / Self Care | Payer: Self-pay | Source: Ambulatory Visit | Attending: Cardiovascular Disease | Admitting: Cardiovascular Disease

## 2011-11-16 ENCOUNTER — Encounter (HOSPITAL_COMMUNITY)
Admission: RE | Admit: 2011-11-16 | Discharge: 2011-11-16 | Disposition: A | Discharge status: Home / Self Care | Payer: Self-pay | Source: Ambulatory Visit | Attending: Cardiovascular Disease | Admitting: Cardiovascular Disease

## 2011-11-17 ENCOUNTER — Ambulatory Visit (INDEPENDENT_AMBULATORY_CARE_PROVIDER_SITE_OTHER): Payer: Medicare Other

## 2011-11-17 DIAGNOSIS — J309 Allergic rhinitis, unspecified: Secondary | ICD-10-CM | POA: Diagnosis not present

## 2011-11-18 ENCOUNTER — Encounter (HOSPITAL_COMMUNITY): Payer: Self-pay

## 2011-11-21 ENCOUNTER — Encounter (HOSPITAL_COMMUNITY)
Admission: RE | Admit: 2011-11-21 | Discharge: 2011-11-21 | Disposition: A | Discharge status: Home / Self Care | Payer: Self-pay | Source: Ambulatory Visit | Attending: Cardiovascular Disease | Admitting: Cardiovascular Disease

## 2011-11-23 ENCOUNTER — Encounter (HOSPITAL_COMMUNITY)
Admission: RE | Admit: 2011-11-23 | Discharge: 2011-11-23 | Disposition: A | Discharge status: Home / Self Care | Payer: Self-pay | Source: Ambulatory Visit | Attending: Cardiovascular Disease | Admitting: Cardiovascular Disease

## 2011-11-23 DIAGNOSIS — Z7901 Long term (current) use of anticoagulants: Secondary | ICD-10-CM | POA: Diagnosis not present

## 2011-11-23 DIAGNOSIS — I4891 Unspecified atrial fibrillation: Secondary | ICD-10-CM | POA: Diagnosis not present

## 2011-11-24 ENCOUNTER — Ambulatory Visit (INDEPENDENT_AMBULATORY_CARE_PROVIDER_SITE_OTHER): Payer: Medicare Other

## 2011-11-24 DIAGNOSIS — J309 Allergic rhinitis, unspecified: Secondary | ICD-10-CM

## 2011-11-25 ENCOUNTER — Encounter (HOSPITAL_COMMUNITY): Payer: Medicare Other

## 2011-11-25 DIAGNOSIS — I10 Essential (primary) hypertension: Secondary | ICD-10-CM | POA: Insufficient documentation

## 2011-11-25 DIAGNOSIS — I495 Sick sinus syndrome: Secondary | ICD-10-CM | POA: Insufficient documentation

## 2011-11-25 DIAGNOSIS — I2510 Atherosclerotic heart disease of native coronary artery without angina pectoris: Secondary | ICD-10-CM | POA: Insufficient documentation

## 2011-11-25 DIAGNOSIS — K219 Gastro-esophageal reflux disease without esophagitis: Secondary | ICD-10-CM | POA: Insufficient documentation

## 2011-11-25 DIAGNOSIS — I4891 Unspecified atrial fibrillation: Secondary | ICD-10-CM | POA: Insufficient documentation

## 2011-11-25 DIAGNOSIS — Z8673 Personal history of transient ischemic attack (TIA), and cerebral infarction without residual deficits: Secondary | ICD-10-CM | POA: Insufficient documentation

## 2011-11-25 DIAGNOSIS — I5022 Chronic systolic (congestive) heart failure: Secondary | ICD-10-CM | POA: Insufficient documentation

## 2011-11-25 DIAGNOSIS — Z5189 Encounter for other specified aftercare: Secondary | ICD-10-CM | POA: Insufficient documentation

## 2011-11-25 DIAGNOSIS — Z950 Presence of cardiac pacemaker: Secondary | ICD-10-CM | POA: Insufficient documentation

## 2011-11-25 DIAGNOSIS — Z8546 Personal history of malignant neoplasm of prostate: Secondary | ICD-10-CM | POA: Insufficient documentation

## 2011-11-25 DIAGNOSIS — E785 Hyperlipidemia, unspecified: Secondary | ICD-10-CM | POA: Insufficient documentation

## 2011-11-25 DIAGNOSIS — F1010 Alcohol abuse, uncomplicated: Secondary | ICD-10-CM | POA: Insufficient documentation

## 2011-11-25 DIAGNOSIS — J45909 Unspecified asthma, uncomplicated: Secondary | ICD-10-CM | POA: Insufficient documentation

## 2011-11-25 DIAGNOSIS — I252 Old myocardial infarction: Secondary | ICD-10-CM | POA: Insufficient documentation

## 2011-11-28 ENCOUNTER — Encounter (HOSPITAL_COMMUNITY): Payer: Medicare Other

## 2011-11-30 ENCOUNTER — Encounter (HOSPITAL_COMMUNITY)
Admission: RE | Admit: 2011-11-30 | Discharge: 2011-11-30 | Disposition: A | Discharge status: Home / Self Care | Payer: Medicare Other | Source: Ambulatory Visit | Attending: Cardiovascular Disease | Admitting: Cardiovascular Disease

## 2011-12-01 ENCOUNTER — Telehealth: Payer: Self-pay | Admitting: Internal Medicine

## 2011-12-01 DIAGNOSIS — I509 Heart failure, unspecified: Secondary | ICD-10-CM | POA: Diagnosis not present

## 2011-12-01 DIAGNOSIS — I447 Left bundle-branch block, unspecified: Secondary | ICD-10-CM | POA: Diagnosis not present

## 2011-12-01 NOTE — Telephone Encounter (Signed)
Forward 5 pages from [facility] to Dr. Birdie Sons for review on 12-01-11 ym

## 2011-12-02 ENCOUNTER — Telehealth: Payer: Self-pay | Admitting: Internal Medicine

## 2011-12-02 ENCOUNTER — Encounter (HOSPITAL_COMMUNITY): Payer: Medicare Other

## 2011-12-02 NOTE — Telephone Encounter (Signed)
New problem:   Please call Dr. Alanda Amass back concerning patient .

## 2011-12-02 NOTE — Telephone Encounter (Signed)
Will forward message to Dr Johney Frame  He is at hospital today and Dr Alanda Amass has been made aware of this

## 2011-12-05 ENCOUNTER — Encounter (HOSPITAL_COMMUNITY)
Admission: RE | Admit: 2011-12-05 | Discharge: 2011-12-05 | Disposition: A | Discharge status: Home / Self Care | Payer: Medicare Other | Source: Ambulatory Visit | Attending: Cardiovascular Disease | Admitting: Cardiovascular Disease

## 2011-12-05 DIAGNOSIS — L578 Other skin changes due to chronic exposure to nonionizing radiation: Secondary | ICD-10-CM | POA: Diagnosis not present

## 2011-12-05 DIAGNOSIS — L570 Actinic keratosis: Secondary | ICD-10-CM | POA: Diagnosis not present

## 2011-12-07 ENCOUNTER — Encounter (HOSPITAL_COMMUNITY)
Admission: RE | Admit: 2011-12-07 | Discharge: 2011-12-07 | Disposition: A | Discharge status: Home / Self Care | Payer: Medicare Other | Source: Ambulatory Visit | Attending: Cardiovascular Disease | Admitting: Cardiovascular Disease

## 2011-12-09 ENCOUNTER — Encounter (HOSPITAL_COMMUNITY): Payer: Medicare Other

## 2011-12-12 ENCOUNTER — Encounter (HOSPITAL_COMMUNITY)
Admission: RE | Admit: 2011-12-12 | Discharge: 2011-12-12 | Disposition: A | Discharge status: Home / Self Care | Payer: Medicare Other | Source: Ambulatory Visit | Attending: Cardiovascular Disease | Admitting: Cardiovascular Disease

## 2011-12-14 ENCOUNTER — Encounter (HOSPITAL_COMMUNITY)
Admission: RE | Admit: 2011-12-14 | Discharge: 2011-12-14 | Disposition: A | Discharge status: Home / Self Care | Payer: Medicare Other | Source: Ambulatory Visit | Attending: Cardiovascular Disease | Admitting: Cardiovascular Disease

## 2011-12-15 ENCOUNTER — Ambulatory Visit (INDEPENDENT_AMBULATORY_CARE_PROVIDER_SITE_OTHER): Payer: Medicare Other

## 2011-12-15 DIAGNOSIS — J309 Allergic rhinitis, unspecified: Secondary | ICD-10-CM

## 2011-12-15 NOTE — Telephone Encounter (Signed)
I have spoken with Dr Alanda Amass. Per his request, please schedule follow-up with me at the next available time.

## 2011-12-16 ENCOUNTER — Encounter (HOSPITAL_COMMUNITY): Payer: Self-pay | Admitting: Nurse Practitioner

## 2011-12-16 ENCOUNTER — Encounter (HOSPITAL_COMMUNITY): Payer: Medicare Other

## 2011-12-16 ENCOUNTER — Emergency Department (HOSPITAL_COMMUNITY): Payer: Medicare Other

## 2011-12-16 ENCOUNTER — Emergency Department (HOSPITAL_COMMUNITY)
Admission: EM | Admit: 2011-12-16 | Discharge: 2011-12-16 | Disposition: A | Discharge status: Home / Self Care | Payer: Medicare Other | Attending: Emergency Medicine | Admitting: Emergency Medicine

## 2011-12-16 DIAGNOSIS — Z7982 Long term (current) use of aspirin: Secondary | ICD-10-CM | POA: Insufficient documentation

## 2011-12-16 DIAGNOSIS — J45901 Unspecified asthma with (acute) exacerbation: Secondary | ICD-10-CM | POA: Insufficient documentation

## 2011-12-16 DIAGNOSIS — R059 Cough, unspecified: Secondary | ICD-10-CM

## 2011-12-16 DIAGNOSIS — I4891 Unspecified atrial fibrillation: Secondary | ICD-10-CM | POA: Insufficient documentation

## 2011-12-16 DIAGNOSIS — E1169 Type 2 diabetes mellitus with other specified complication: Secondary | ICD-10-CM | POA: Insufficient documentation

## 2011-12-16 DIAGNOSIS — Z8679 Personal history of other diseases of the circulatory system: Secondary | ICD-10-CM | POA: Insufficient documentation

## 2011-12-16 DIAGNOSIS — Z8719 Personal history of other diseases of the digestive system: Secondary | ICD-10-CM | POA: Insufficient documentation

## 2011-12-16 DIAGNOSIS — R05 Cough: Secondary | ICD-10-CM | POA: Insufficient documentation

## 2011-12-16 DIAGNOSIS — Z860100 Personal history of colon polyps, unspecified: Secondary | ICD-10-CM | POA: Insufficient documentation

## 2011-12-16 DIAGNOSIS — G4700 Insomnia, unspecified: Secondary | ICD-10-CM | POA: Insufficient documentation

## 2011-12-16 DIAGNOSIS — F1010 Alcohol abuse, uncomplicated: Secondary | ICD-10-CM | POA: Insufficient documentation

## 2011-12-16 DIAGNOSIS — R042 Hemoptysis: Secondary | ICD-10-CM | POA: Insufficient documentation

## 2011-12-16 DIAGNOSIS — E785 Hyperlipidemia, unspecified: Secondary | ICD-10-CM | POA: Insufficient documentation

## 2011-12-16 DIAGNOSIS — Z8739 Personal history of other diseases of the musculoskeletal system and connective tissue: Secondary | ICD-10-CM | POA: Insufficient documentation

## 2011-12-16 DIAGNOSIS — I2510 Atherosclerotic heart disease of native coronary artery without angina pectoris: Secondary | ICD-10-CM | POA: Insufficient documentation

## 2011-12-16 DIAGNOSIS — G4733 Obstructive sleep apnea (adult) (pediatric): Secondary | ICD-10-CM | POA: Insufficient documentation

## 2011-12-16 DIAGNOSIS — R0609 Other forms of dyspnea: Secondary | ICD-10-CM | POA: Insufficient documentation

## 2011-12-16 DIAGNOSIS — I10 Essential (primary) hypertension: Secondary | ICD-10-CM | POA: Insufficient documentation

## 2011-12-16 DIAGNOSIS — Z8673 Personal history of transient ischemic attack (TIA), and cerebral infarction without residual deficits: Secondary | ICD-10-CM | POA: Insufficient documentation

## 2011-12-16 DIAGNOSIS — Z950 Presence of cardiac pacemaker: Secondary | ICD-10-CM | POA: Insufficient documentation

## 2011-12-16 DIAGNOSIS — Z79899 Other long term (current) drug therapy: Secondary | ICD-10-CM | POA: Insufficient documentation

## 2011-12-16 DIAGNOSIS — R0600 Dyspnea, unspecified: Secondary | ICD-10-CM

## 2011-12-16 DIAGNOSIS — R0602 Shortness of breath: Secondary | ICD-10-CM | POA: Diagnosis not present

## 2011-12-16 DIAGNOSIS — R079 Chest pain, unspecified: Secondary | ICD-10-CM | POA: Diagnosis not present

## 2011-12-16 DIAGNOSIS — R0989 Other specified symptoms and signs involving the circulatory and respiratory systems: Secondary | ICD-10-CM | POA: Insufficient documentation

## 2011-12-16 DIAGNOSIS — K219 Gastro-esophageal reflux disease without esophagitis: Secondary | ICD-10-CM | POA: Insufficient documentation

## 2011-12-16 DIAGNOSIS — Z8546 Personal history of malignant neoplasm of prostate: Secondary | ICD-10-CM | POA: Insufficient documentation

## 2011-12-16 DIAGNOSIS — Z87891 Personal history of nicotine dependence: Secondary | ICD-10-CM | POA: Insufficient documentation

## 2011-12-16 LAB — CBC WITH DIFFERENTIAL/PLATELET
Basophils Relative: 1 % (ref 0–1)
Eosinophils Absolute: 0.1 10*3/uL (ref 0.0–0.7)
Hemoglobin: 12.4 g/dL — ABNORMAL LOW (ref 13.0–17.0)
Lymphs Abs: 1.2 10*3/uL (ref 0.7–4.0)
MCH: 27.3 pg (ref 26.0–34.0)
Neutro Abs: 2.6 10*3/uL (ref 1.7–7.7)
Neutrophils Relative %: 59 % (ref 43–77)
Platelets: 214 10*3/uL (ref 150–400)
RBC: 4.54 MIL/uL (ref 4.22–5.81)

## 2011-12-16 LAB — BASIC METABOLIC PANEL
Calcium: 8.6 mg/dL (ref 8.4–10.5)
GFR calc Af Amer: 68 mL/min — ABNORMAL LOW (ref >90)
GFR calc non Af Amer: 58 mL/min — ABNORMAL LOW (ref >90)
Glucose, Bld: 97 mg/dL (ref 70–99)
Potassium: 3.9 mEq/L (ref 3.5–5.1)
Sodium: 138 mEq/L (ref 135–145)

## 2011-12-16 LAB — PROTIME-INR
INR: 2.25 — ABNORMAL HIGH (ref 0.00–1.49)
Prothrombin Time: 23.9 seconds — ABNORMAL HIGH (ref 11.6–15.2)

## 2011-12-16 MED ORDER — AMOXICILLIN-POT CLAVULANATE 875-125 MG PO TABS: 1.0000 | ORAL_TABLET | Freq: Two times a day (BID) | ORAL | Status: DC

## 2011-12-16 MED ORDER — AMOXICILLIN-POT CLAVULANATE 875-125 MG PO TABS
1.0000 | ORAL_TABLET | Freq: Once | ORAL | Status: AC
  Administered 2011-12-16: 1 via ORAL
  Filled 2011-12-16: qty 1

## 2011-12-16 MED ORDER — METHYLPREDNISOLONE SODIUM SUCC 125 MG IJ SOLR
125.0000 mg | Freq: Once | INTRAMUSCULAR | Status: AC
  Administered 2011-12-16: 125 mg via INTRAVENOUS
  Filled 2011-12-16: qty 2

## 2011-12-16 MED ORDER — ALBUTEROL SULFATE (5 MG/ML) 0.5% IN NEBU
5.0000 mg | INHALATION_SOLUTION | Freq: Once | RESPIRATORY_TRACT | Status: AC
  Administered 2011-12-16: 5 mg via RESPIRATORY_TRACT
  Filled 2011-12-16: qty 1

## 2011-12-16 MED ORDER — BENZONATATE 100 MG PO CAPS: 100.0000 mg | ORAL_CAPSULE | Freq: Three times a day (TID) | ORAL | Status: DC

## 2011-12-16 MED ORDER — SODIUM CHLORIDE 0.9 % IV SOLN
INTRAVENOUS | Status: DC
  Administered 2011-12-16: 10:00:00 via INTRAVENOUS

## 2011-12-16 NOTE — Telephone Encounter (Signed)
Staff message sent to [facility] to sch pt an appointment

## 2011-12-16 NOTE — ED Provider Notes (Addendum)
History     CSN: [id number]  Arrival date & time 12/16/11  4098   First MD Initiated Contact with Patient 12/16/11 0945      Chief Complaint  Patient presents with  . Shortness of Breath    (Consider location/radiation/quality/duration/timing/severity/associated sxs/prior treatment) Patient is a 76 y.o. male presenting with shortness of breath. The history is provided by the patient.  Shortness of Breath  Associated symptoms include cough and shortness of breath. Pertinent negatives include no chest pain and no fever.   76 year old, male, with a history of atrial fibrillation, and congestive heart failure, presents to emergency department complaining of shortness of breath, and hemoptysis.  Since last night.  He denies pain anywhere.  He has not had fevers, chills, nausea, vomiting, sweating.  He denies leg pain or swelling.  He denies bleeding from his gums or in his stool.  He does say he has had blood coming from his nose.  When he blows his nose.  He states that he is recently had a cold.  He also states that he has had a lesion on his vocal cords.  He was seen at [facility].  For the vocal cord lesions.  They tried to excise them, but were unable to.  Past Medical History  Diagnosis Date  . Alcohol abuse, episodic 05/19/2008  . ALLERGIC RHINITIS 04/15/2009  . COLONIC POLYPS, ADENOMATOUS, HX OF 02/14/2008  . DIVERTICULOSIS, COLON 02/14/2008  . ESOPHAGEAL STRICTURE 01/28/2008  . GERD 02/14/2008  . HYPERGLYCEMIA 11/16/2006  . HYPERLIPIDEMIA 02/14/2008  . INSOMNIA-SLEEP DISORDER-UNSPEC 02/11/2009  . 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 PROSTATE CANCER, HX OF 02/25/2000  . TRANSIENT ISCHEMIC ATTACKS, HX OF 11/16/2006  . Arthritis   . Atrial fibrillation 11/16/2006    remote CHF related to atrial fib with rapid ventricular response over 25 yrs per office note,  . ASTHMA 11/16/2006    sinusitis-    hx ?yeast patch/white patch on vocal cord as per [facility] 02/25/11-   . OBSTRUCTIVE  SLEEP APNEA 11/10/2008  . SLEEP APNEA 10/03/2009    LOV Dr Maple Hudson 12/12 in EPIC    Moderate per patient- settings ?6/last sleep study years ago  . HYPERTENSION 11/16/2006  . CORONARY ARTERY DISEASE 11/16/2006    nonischemic cardiomyapathy, last stress test 2011- LOV with clearance note Dr  Alanda Amass and EKG 1/13 on chart  . Symptomatic bradycardia, secondary to sinus node dysfunction 07/08/2011    s/p [employer] PPM by Dr Royann Shivers  . S/P cardiac pacemaker procedure, 07/08/11, [employer] 07/08/2011    Past Surgical History  Procedure Date  . Tonsilectomy, adenoidectomy, bilateral myringotomy and tubes   . Knee arthroscopy     2 surgeries right and 1 left  . Prostate surgery 2/02    prostate cancer  . Uvulopalatopharyngoplasty   . Cardiac catheterization   . Tonsillectomy   . Esophagogastroduodenoscopy     with dilitation  . Lumbar laminectomy/decompression microdiscectomy 03/02/2011    Procedure: LUMBAR LAMINECTOMY/DECOMPRESSION MICRODISCECTOMY;  Surgeon: Javier Docker, MD;  Location: [facility];  Service: Orthopedics;  Laterality: N/A;  Decompression Lumbar 4 - Lumbar(X-Ray)  . Pacemaker insertion 06/2011    [employer] PPM implanted by Dr Royann Shivers    Family History  Problem Relation Age of Onset  . Heart attack Father   . Heart attack Brother   . Heart disease Maternal Uncle   . Heart attack Maternal Uncle   . Colon cancer Neg Hx     History  Substance Use  Topics  . Smoking status: Former Smoker    Types: Cigarettes    Quit date: 03/22/1964  . Smokeless tobacco: Former Neurosurgeon    Quit date: 05/10/1964  . Alcohol Use: 8.4 oz/week    14 Shots of liquor per week     Comment: 2 mixed drinks daily      Review of Systems  Constitutional: Negative for fever, chills and diaphoresis.  HENT: Negative for neck pain.   Eyes: Negative for redness.  Respiratory: Positive for cough and shortness of breath. Negative for chest tightness.   Cardiovascular:  Negative for chest pain and leg swelling.  Gastrointestinal: Negative for nausea, vomiting and blood in stool.  Genitourinary: Negative for hematuria.  Musculoskeletal: Negative for back pain.  Skin: Negative for rash.  Neurological: Negative for weakness and headaches.  Hematological: Does not bruise/bleed easily.  Psychiatric/Behavioral: Negative for confusion.  All other systems reviewed and are negative.    Allergies  Hydrocodone and Oxycodone  Home Medications   Current Outpatient Rx  Name  Route  Sig  Dispense  Refill  . ALBUTEROL SULFATE HFA 108 (90 BASE) MCG/ACT IN AERS   Inhalation   Inhale 2 puffs into the lungs every 6 (six) hours as needed. For shortness of breath         . AMIODARONE HCL 200 MG PO TABS   Oral   Take 200 mg by mouth daily.          . ASPIRIN 81 MG PO TABS   Oral   Take 81 mg by mouth daily.          Marland Kitchen LORAZEPAM 0.5 MG PO TABS   Oral   Take 0.5 mg by mouth every 8 (eight) hours.         Marland Kitchen METOPROLOL SUCCINATE ER 25 MG PO TB24   Oral   Take 25 mg by mouth. 1 tablet in the morning and 1 tablet at night         . MOMETASONE FUROATE 50 MCG/ACT NA SUSP   Nasal   Place 2 sprays into the nose daily.         Marland Kitchen PANTOPRAZOLE SODIUM 40 MG PO TBEC   Oral   Take 40 mg by mouth daily.         Marland Kitchen RAMIPRIL 10 MG PO TABS   Oral   Take 10 mg by mouth daily.         Marland Kitchen SPIRONOLACTONE 25 MG PO TABS   Oral   Take 25 mg by mouth daily.          . WARFARIN SODIUM 5 MG PO TABS   Oral   Take 5 mg by mouth daily.         Marland Kitchen ZOLPIDEM TARTRATE 5 MG PO TABS   Oral   Take 5 mg by mouth at bedtime.           BP 144/95  Temp 97.5 F (36.4 C) (Oral)  Resp 24  Ht 6' (1.829 m)  Wt 185 lb (83.915 kg)  BMI 25.09 kg/m2  SpO2 95%  Physical Exam  Nursing note and vitals reviewed. Constitutional: He is oriented to person, place, and time. He appears well-developed and well-nourished. No distress.  HENT:  Head: Normocephalic and  atraumatic.  Eyes: Conjunctivae normal and EOM are normal. Pupils are equal, round, and reactive to light.  Neck: Normal range of motion. Neck supple. No JVD present.  Cardiovascular: Normal rate and intact distal pulses.  No murmur heard. Pulmonary/Chest: Effort normal and breath sounds normal. No respiratory distress. He has no wheezes. He has no rales.  Abdominal: Soft. Bowel sounds are normal. He exhibits no distension. There is no tenderness.  Musculoskeletal: Normal range of motion. He exhibits no edema and no tenderness.  Neurological: He is alert and oriented to person, place, and time. No cranial nerve deficit.  Skin: Skin is warm and dry.  Psychiatric: He has a normal mood and affect. Thought content normal.    ED Course  Procedures (including critical care time) Shortness of breath, and hemoptysis.  Since yesterday.  History recent cold.  He is on Coumadin.  No pain anywhere.  No fever.  His oxygen saturations are 98% on room air.  His lungs are clear.  We will give him an albuterol nebulizer because of his symptoms.  Perform chest x-ray, and laboratory testing. Labs Reviewed  CBC WITH DIFFERENTIAL - Abnormal; Notable for the following:    Hemoglobin 12.4 (*)     HCT 36.7 (*)     All other components within normal limits  BASIC METABOLIC PANEL  PROTIME-INR   Dg Chest Port 1 View  12/16/2011  *RADIOLOGY REPORT*  Clinical Data: Chest pain and shortness of breath.  PORTABLE CHEST - 1 VIEW  Comparison: 11/02/2011  Findings: 0958 hours. The cardiopericardial silhouette is enlarged. Interstitial markings are diffusely coarsened with chronic features.  There is some hazy opacity in the right cardiophrenic angle, suggesting atelectasis or infiltrate.  Left-sided dual lead permanent pacemaker remains in place. Telemetry leads overlie the chest.  IMPRESSION: Cardiomegaly with new haziness in the right cardiophrenic angle suggesting atelectasis or infiltrate.   Original Report  Authenticated By: Kennith Center, M.D.      No diagnosis found.  ECG. Sinus rhythm at 61 beats per minute. Left axis deviation. First degree AV block. Nonspecific intraventricular conduction delay Left ventricular hypertrophy. Nonspecific T-wave changes   MDM  Dyspnea        Cheri Guppy, MD 12/16/11 1048  Cheri Guppy, MD 12/16/11 1051

## 2011-12-16 NOTE — ED Notes (Signed)
Pt asked to change into gown. 

## 2011-12-16 NOTE — ED Notes (Signed)
Pt discharged to home with family. NAD.  

## 2011-12-16 NOTE — ED Notes (Signed)
Pt. C/o of "bouts" of SOB after pace maker insertion. Pt states been coughing up blood past couple few days. Pt has had vocal cord surgery 3 months ago

## 2011-12-18 ENCOUNTER — Emergency Department (HOSPITAL_COMMUNITY)
Admission: EM | Admit: 2011-12-18 | Discharge: 2011-12-18 | Disposition: A | Discharge status: Home / Self Care | Payer: Medicare Other | Attending: Emergency Medicine | Admitting: Emergency Medicine

## 2011-12-18 ENCOUNTER — Emergency Department (HOSPITAL_COMMUNITY): Payer: Medicare Other

## 2011-12-18 ENCOUNTER — Encounter (HOSPITAL_COMMUNITY): Payer: Self-pay | Admitting: *Deleted

## 2011-12-18 DIAGNOSIS — J069 Acute upper respiratory infection, unspecified: Secondary | ICD-10-CM | POA: Diagnosis not present

## 2011-12-18 DIAGNOSIS — R05 Cough: Secondary | ICD-10-CM | POA: Diagnosis not present

## 2011-12-18 DIAGNOSIS — Z79899 Other long term (current) drug therapy: Secondary | ICD-10-CM | POA: Diagnosis not present

## 2011-12-18 DIAGNOSIS — R110 Nausea: Secondary | ICD-10-CM | POA: Insufficient documentation

## 2011-12-18 DIAGNOSIS — I10 Essential (primary) hypertension: Secondary | ICD-10-CM | POA: Insufficient documentation

## 2011-12-18 DIAGNOSIS — R059 Cough, unspecified: Secondary | ICD-10-CM | POA: Insufficient documentation

## 2011-12-18 DIAGNOSIS — Z8739 Personal history of other diseases of the musculoskeletal system and connective tissue: Secondary | ICD-10-CM | POA: Diagnosis not present

## 2011-12-18 DIAGNOSIS — I4891 Unspecified atrial fibrillation: Secondary | ICD-10-CM | POA: Insufficient documentation

## 2011-12-18 DIAGNOSIS — Z8719 Personal history of other diseases of the digestive system: Secondary | ICD-10-CM | POA: Diagnosis not present

## 2011-12-18 DIAGNOSIS — R7309 Other abnormal glucose: Secondary | ICD-10-CM | POA: Diagnosis not present

## 2011-12-18 DIAGNOSIS — Z8546 Personal history of malignant neoplasm of prostate: Secondary | ICD-10-CM | POA: Insufficient documentation

## 2011-12-18 DIAGNOSIS — K219 Gastro-esophageal reflux disease without esophagitis: Secondary | ICD-10-CM | POA: Insufficient documentation

## 2011-12-18 DIAGNOSIS — J189 Pneumonia, unspecified organism: Secondary | ICD-10-CM | POA: Insufficient documentation

## 2011-12-18 DIAGNOSIS — G4733 Obstructive sleep apnea (adult) (pediatric): Secondary | ICD-10-CM | POA: Insufficient documentation

## 2011-12-18 DIAGNOSIS — J45909 Unspecified asthma, uncomplicated: Secondary | ICD-10-CM | POA: Diagnosis not present

## 2011-12-18 DIAGNOSIS — Z860100 Personal history of colon polyps, unspecified: Secondary | ICD-10-CM | POA: Insufficient documentation

## 2011-12-18 DIAGNOSIS — E785 Hyperlipidemia, unspecified: Secondary | ICD-10-CM | POA: Insufficient documentation

## 2011-12-18 DIAGNOSIS — Z8673 Personal history of transient ischemic attack (TIA), and cerebral infarction without residual deficits: Secondary | ICD-10-CM | POA: Diagnosis not present

## 2011-12-18 DIAGNOSIS — Z7901 Long term (current) use of anticoagulants: Secondary | ICD-10-CM | POA: Diagnosis not present

## 2011-12-18 DIAGNOSIS — G4700 Insomnia, unspecified: Secondary | ICD-10-CM | POA: Insufficient documentation

## 2011-12-18 DIAGNOSIS — Z87891 Personal history of nicotine dependence: Secondary | ICD-10-CM | POA: Insufficient documentation

## 2011-12-18 DIAGNOSIS — R0602 Shortness of breath: Secondary | ICD-10-CM | POA: Diagnosis not present

## 2011-12-18 DIAGNOSIS — J90 Pleural effusion, not elsewhere classified: Secondary | ICD-10-CM | POA: Diagnosis not present

## 2011-12-18 DIAGNOSIS — I2510 Atherosclerotic heart disease of native coronary artery without angina pectoris: Secondary | ICD-10-CM | POA: Diagnosis not present

## 2011-12-18 LAB — POCT I-STAT TROPONIN I

## 2011-12-18 LAB — BASIC METABOLIC PANEL
BUN: 25 mg/dL — ABNORMAL HIGH (ref 6–23)
CO2: 27 mEq/L (ref 19–32)
Chloride: 105 mEq/L (ref 96–112)
GFR calc Af Amer: 62 mL/min — ABNORMAL LOW (ref >90)
Potassium: 4.5 mEq/L (ref 3.5–5.1)

## 2011-12-18 LAB — CBC WITH DIFFERENTIAL/PLATELET
Basophils Relative: 0 % (ref 0–1)
Hemoglobin: 12.9 g/dL — ABNORMAL LOW (ref 13.0–17.0)
Lymphocytes Relative: 21 % (ref 12–46)
MCHC: 33.4 g/dL (ref 30.0–36.0)
Monocytes Relative: 7 % (ref 3–12)
Neutro Abs: 5.2 10*3/uL (ref 1.7–7.7)
Neutrophils Relative %: 71 % (ref 43–77)
RBC: 4.83 MIL/uL (ref 4.22–5.81)
WBC: 7.3 10*3/uL (ref 4.0–10.5)

## 2011-12-18 LAB — PRO B NATRIURETIC PEPTIDE: Pro B Natriuretic peptide (BNP): 1423 pg/mL — ABNORMAL HIGH (ref 0–125)

## 2011-12-18 MED ORDER — AEROCHAMBER Z-STAT PLUS/MEDIUM MISC: 1.0000 | Freq: Once | Status: DC

## 2011-12-18 MED ORDER — ALBUTEROL SULFATE (5 MG/ML) 0.5% IN NEBU
5.0000 mg | INHALATION_SOLUTION | RESPIRATORY_TRACT | Status: AC
  Administered 2011-12-18 (×2): 5 mg via RESPIRATORY_TRACT
  Filled 2011-12-18 (×2): qty 1

## 2011-12-18 MED ORDER — AEROCHAMBER PLUS W/MASK MISC
Status: AC
  Filled 2011-12-18: qty 1

## 2011-12-18 MED ORDER — ALBUTEROL SULFATE HFA 108 (90 BASE) MCG/ACT IN AERS
2.0000 | INHALATION_SPRAY | RESPIRATORY_TRACT | Status: DC | PRN
  Filled 2011-12-18: qty 6.7

## 2011-12-18 NOTE — ED Notes (Addendum)
Patient c/o productive cough, states Friday he had a "fair amount" of blood in mucous but denies blood in mucous today. Pt was seen [facility] on Friday and diagnosed with pneumonia and given antibiotics. Pt c/o SOB. Pt states hx of a fib, and fluid overload. Pt speaking in complete sentences, SpO2 100% on RA. Denies fevers.

## 2011-12-18 NOTE — ED Notes (Signed)
Pt transported to xray 

## 2011-12-18 NOTE — ED Notes (Signed)
Patient was seen here and dx with pneumonia on Friday.  He states he is feeling worse.  He has increased sob.  He has had worse cough.  Patient states he is feeling weak as well.  Patient denies chest pain

## 2011-12-18 NOTE — ED Provider Notes (Signed)
History     CSN: [id number]  Arrival date & time 12/18/11  1209   First MD Initiated Contact with Patient 12/18/11 1257      Chief Complaint  Patient presents with  . Shortness of Breath  . URI    (Consider location/radiation/quality/duration/timing/severity/associated sxs/prior treatment) HPI Comments: Patient with history of atrial fibrillation on Coumadin presents with complaint of continued shortness of breath. Patient was seen in emergency department 2 days ago and was diagnosed with pneumonia by chest x-ray. Patient was discharged home on Augmentin and Tessalon. Patient states that 2 days ago and yesterday he felt like he was improving however had increased shortness of breath with cough this morning. Patient's cough is been productive of a small amount of thick sputum that is not mixed with blood. He has had some nausea but no vomiting. No fever or chills. He denies chest pain, abdominal pain, urinary symptoms. Onset gradual. Course is constant. Nothing makes symptoms better or worse. No leg swelling. On spironolactone.   Patient is a 77 y.o. male presenting with shortness of breath and URI. The history is provided by the patient.  Shortness of Breath  Associated symptoms include cough and shortness of breath. Pertinent negatives include no chest pain, no fever, no rhinorrhea and no sore throat.  URI The primary symptoms include cough and nausea. Primary symptoms do not include fever, headaches, sore throat, abdominal pain, vomiting, myalgias or rash.  The illness is not associated with chills or rhinorrhea.    Past Medical History  Diagnosis Date  . Alcohol abuse, episodic 05/19/2008  . ALLERGIC RHINITIS 04/15/2009  . COLONIC POLYPS, ADENOMATOUS, HX OF 02/14/2008  . DIVERTICULOSIS, COLON 02/14/2008  . ESOPHAGEAL STRICTURE 01/28/2008  . GERD 02/14/2008  . HYPERGLYCEMIA 11/16/2006  . HYPERLIPIDEMIA 02/14/2008  . INSOMNIA-SLEEP DISORDER-UNSPEC 02/11/2009  . 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 PROSTATE CANCER, HX OF 02/25/2000  . TRANSIENT ISCHEMIC ATTACKS, HX OF 11/16/2006  . Arthritis   . Atrial fibrillation 11/16/2006    remote CHF related to atrial fib with rapid ventricular response over 25 yrs per office note,  . ASTHMA 11/16/2006    sinusitis-    hx ?yeast patch/white patch on vocal cord as per [facility] 02/25/11-   . OBSTRUCTIVE SLEEP APNEA 11/10/2008  . SLEEP APNEA 10/03/2009    LOV Dr Maple Hudson 12/12 in [facility]    Moderate per patient- settings ?6/last sleep study years ago  . HYPERTENSION 11/16/2006  . CORONARY ARTERY DISEASE 11/16/2006    nonischemic cardiomyapathy, last stress test 2011- LOV with clearance note Dr  Alanda Amass and EKG 1/13 on chart  . Symptomatic bradycardia, secondary to sinus node dysfunction 07/08/2011    s/p [employer] PPM by Dr Royann Shivers  . S/P cardiac pacemaker procedure, 07/08/11, [employer] 07/08/2011    Past Surgical History  Procedure Date  . Tonsilectomy, adenoidectomy, bilateral myringotomy and tubes   . Knee arthroscopy     2 surgeries right and 1 left  . Prostate surgery 2/02    prostate cancer  . Uvulopalatopharyngoplasty   . Cardiac catheterization   . Tonsillectomy   . Esophagogastroduodenoscopy     with dilitation  . Lumbar laminectomy/decompression microdiscectomy 03/02/2011    Procedure: LUMBAR LAMINECTOMY/DECOMPRESSION MICRODISCECTOMY;  Surgeon: Javier Docker, MD;  Location: [facility];  Service: Orthopedics;  Laterality: N/A;  Decompression Lumbar 4 - Lumbar(X-Ray)  . Pacemaker insertion 06/2011    [employer] PPM implanted by Dr Royann Shivers    Family History  Problem Relation Age  of Onset  . Heart attack Father   . Heart attack Brother   . Heart disease Maternal Uncle   . Heart attack Maternal Uncle   . Colon cancer Neg Hx     History  Substance Use Topics  . Smoking status: Former Smoker    Types: Cigarettes    Quit date: 03/22/1964  . Smokeless tobacco: Former Neurosurgeon    Quit date:  05/10/1964  . Alcohol Use: 8.4 oz/week    14 Shots of liquor per week     Comment: 2 mixed drinks daily      Review of Systems  Constitutional: Negative for fever and chills.  HENT: Negative for sore throat and rhinorrhea.   Eyes: Negative for redness.  Respiratory: Positive for cough and shortness of breath.   Cardiovascular: Negative for chest pain and leg swelling.  Gastrointestinal: Positive for nausea. Negative for vomiting, abdominal pain and diarrhea.  Genitourinary: Negative for dysuria.  Musculoskeletal: Negative for myalgias.  Skin: Negative for rash.  Neurological: Negative for headaches.    Allergies  Hydrocodone and Oxycodone  Home Medications   Current Outpatient Rx  Name  Route  Sig  Dispense  Refill  . ALBUTEROL SULFATE HFA 108 (90 BASE) MCG/ACT IN AERS   Inhalation   Inhale 2 puffs into the lungs every 6 (six) hours as needed. For shortness of breath         . AMIODARONE HCL 200 MG PO TABS   Oral   Take 200 mg by mouth daily.          . AMOXICILLIN-POT CLAVULANATE 875-125 MG PO TABS   Oral   Take 1 tablet by mouth 2 (two) times daily. One po bid x 7 days   20 tablet   0   . ASPIRIN 81 MG PO TABS   Oral   Take 81 mg by mouth daily.          Marland Kitchen BENZONATATE 100 MG PO CAPS   Oral   Take 1 capsule (100 mg total) by mouth every 8 (eight) hours.   21 capsule   0   . LORAZEPAM 0.5 MG PO TABS   Oral   Take 0.5 mg by mouth every 8 (eight) hours.         Marland Kitchen METOPROLOL SUCCINATE ER 25 MG PO TB24   Oral   Take 25 mg by mouth. 1 tablet in the morning and 1 tablet at night         . MOMETASONE FUROATE 50 MCG/ACT NA SUSP   Nasal   Place 2 sprays into the nose daily.         Marland Kitchen PANTOPRAZOLE SODIUM 40 MG PO TBEC   Oral   Take 40 mg by mouth daily.         Marland Kitchen RAMIPRIL 10 MG PO TABS   Oral   Take 10 mg by mouth daily.         Marland Kitchen SPIRONOLACTONE 25 MG PO TABS   Oral   Take 25 mg by mouth daily.          . WARFARIN SODIUM 5 MG PO  TABS   Oral   Take 5 mg by mouth daily.         Marland Kitchen ZOLPIDEM TARTRATE 5 MG PO TABS   Oral   Take 5 mg by mouth at bedtime.           BP 138/91  Pulse 60  Resp 22  Ht 6' (1.829  m)  Wt 185 lb (83.915 kg)  BMI 25.09 kg/m2  SpO2 100%  Physical Exam  Nursing note and vitals reviewed. Constitutional: He appears well-developed and well-nourished.  HENT:  Head: Normocephalic and atraumatic.  Eyes: Conjunctivae normal are normal. Right eye exhibits no discharge. Left eye exhibits no discharge.  Neck: Normal range of motion. Neck supple.  Cardiovascular: Normal rate, regular rhythm and normal heart sounds.   No murmur heard. Pulmonary/Chest: Effort normal. No respiratory distress. He has no wheezes. He has rales (bases). He exhibits no tenderness.  Abdominal: Soft. Bowel sounds are normal. He exhibits no pulsatile midline mass. There is no tenderness. There is no rebound and no guarding.  Neurological: He is alert.  Skin: Skin is warm and dry.  Psychiatric: He has a normal mood and affect.    ED Course  Procedures (including critical care time)  Labs Reviewed  CBC WITH DIFFERENTIAL - Abnormal; Notable for the following:    Hemoglobin 12.9 (*)     HCT 38.6 (*)     All other components within normal limits  BASIC METABOLIC PANEL - Abnormal; Notable for the following:    BUN 25 (*)     GFR calc non Af Amer 53 (*)     GFR calc Af Amer 62 (*)     All other components within normal limits  PRO B NATRIURETIC PEPTIDE - Abnormal; Notable for the following:    Pro B Natriuretic peptide (BNP) 1423.0 (*)     All other components within normal limits  POCT I-STAT TROPONIN I   Dg Chest 2 View  12/18/2011  *RADIOLOGY REPORT*  Clinical Data: Shortness of breath, cough and congestion.  CHEST - 2 VIEW  Comparison: Plain film of the chest 12/16/2011.  Findings: Right basilar airspace disease seen on the prior study has improved.  There is a small right pleural effusion.  No pneumothorax.   Heart size upper normal.  IMPRESSION: Improved right lower lobe airspace disease.  Small right effusion noted.   Original Report Authenticated By: Holley Dexter, M.D.      1. Community acquired pneumonia     1:10 PM Patient seen and examined. Work-up initiated. 95% on RA when O2 removed.   Vital signs reviewed and are as follows: Filed Vitals:   12/18/11 1213  BP: 138/91  Pulse: 60  Resp: 22   1:15 PM D/w Dr. Silverio Lay who will see. Albuterol ordered.    Date: 12/18/2011  Rate: 60  Rhythm: Paced rhythm  QRS Axis: left  Intervals: Paced  ST/T Wave abnormalities: normal  Conduction Disutrbances:Paced rhythm  Narrative Interpretation:   Old EKG Reviewed: unchanged from 12/16/2011    3:35 PM Labs and x-rays reviewed. Patient has ambulated and O2 on RA maintained in upper 90's.  Patient and wife are comfortable with discharge. Patient has previously scheduled PCP appointment in 2 days.   Patient counseled on use of albuterol HFA.  Told to use 1-2 puffs q 4 hours as needed for SOB.  Urged return with worsening symptoms, worsening SOB/increased work of breathing, other concerns. Patient verbalizes understanding and agrees with plan.    MDM  PNA -- improving per CXR, patient has good O2 sat in ED on RA with ambulation. He is not short of breath after treatment. He appears well. He has reliable PCP follow-up. Admission vs discharge d/w patient and wife. They are comfortable with discharge. Feel abx are working and this does not represent outpatient treatment failure.   Mild elevation in  BNP but no overt fluid overload and no pulmonary edema on CXR.         Renne Crigler, [place] 12/18/11 [phone]

## 2011-12-19 ENCOUNTER — Telehealth: Payer: Self-pay | Admitting: Internal Medicine

## 2011-12-19 ENCOUNTER — Encounter (HOSPITAL_COMMUNITY): Payer: Medicare Other

## 2011-12-19 NOTE — Telephone Encounter (Signed)
Per CY-he has reviewed the CXR and advises patient to finish Augmentin RX and see him back about 1-2 weeks; patient can come in on Monday 12-26-11 at 11:30am. Please advise patient he is being worked in and may have a delay/running late when checking in. Thanks.

## 2011-12-19 NOTE — Telephone Encounter (Signed)
[email or web address]  

## 2011-12-19 NOTE — Telephone Encounter (Signed)
Pt is returning triages call.  Pt states he will only be available for another 15 minutes.  I asked if there is an alternate number he can be reached at & pt said "no".  Corey [name]

## 2011-12-19 NOTE — Telephone Encounter (Signed)
Called, spoke with pt.  He was seen in the ER on 11/22 and 11/24 and was dx with pna.  Pt was sent home with augmentin and benzonatate.  Pt believes breathing has improved, no wheezing now, and does have some coughing with light mucus.  No chest tightness or f/c/s now.  He would like to know if this could be affecting his allergies/asthma, if Dr. Maple Hudson would has any further recs, and if he needs to come in soon for a follow up (nothing available with CDY for HFU).  Dr. Maple Hudson, pls advise. THank you.

## 2011-12-19 NOTE — Telephone Encounter (Signed)
Spoke with pt and notified of recs per CDY Pt verbalized understanding. Appt scheduled

## 2011-12-19 NOTE — ED Provider Notes (Signed)
Medical screening examination/treatment/procedure(s) were conducted as a shared visit with non-physician practitioner(s) and myself.  I personally evaluated the patient during the encounter  Corey [name] is a 76 y.o. male hx of afib on coumadin here with cough and SOB. He was diagnosed with pneumonia 2 days prior and was on augmentin. No fever or chills, but still coughing. Repeat CXR stable, WBC nl. He was given albuterol in the ED and felt better. I felt that there may be a superimposed bronchitis. Recommend continue abx and add albuterol prn.    Richardean Canal, MD 12/19/11 [phone]

## 2011-12-19 NOTE — Telephone Encounter (Signed)
ATC - NA - Will try later

## 2011-12-20 ENCOUNTER — Ambulatory Visit (INDEPENDENT_AMBULATORY_CARE_PROVIDER_SITE_OTHER): Payer: Medicare Other | Admitting: Family Medicine

## 2011-12-20 ENCOUNTER — Encounter: Payer: Self-pay | Admitting: Family Medicine

## 2011-12-20 VITALS — BP 122/90 | HR 73 | Temp 97.7°F | Wt 187.0 lb

## 2011-12-20 DIAGNOSIS — R0989 Other specified symptoms and signs involving the circulatory and respiratory systems: Secondary | ICD-10-CM | POA: Diagnosis not present

## 2011-12-20 DIAGNOSIS — R05 Cough: Secondary | ICD-10-CM

## 2011-12-20 DIAGNOSIS — R0609 Other forms of dyspnea: Secondary | ICD-10-CM | POA: Diagnosis not present

## 2011-12-20 DIAGNOSIS — R0600 Dyspnea, unspecified: Secondary | ICD-10-CM

## 2011-12-20 NOTE — Progress Notes (Signed)
Chief Complaint  Patient presents with  . Post ER follow up    per patient still not feeling well; feeling weak; cant cough mucus up    HPI:  Complicated patient who normally is follow by Dr. Cato Mulligan (PCP), Dr. Maple Hudson (pulm) and Dr. Johney Frame (cards) here for ED follow up:  PNA: -ED 12/16/11 and again on 11/24/13for SOB, hemoptysis and cough with rales in bases noted from exam -had CXR, EKG w CBC w/ diff, BMP and BNP (high) and dx with PNA/bronchitis and tx with augmentin and albuterol -Patient reports today that breathing is better last few days, still with drainage in throat and cough but these are improving as well  -denies: fevers, CP, vomiting,nausea, increased LE edema, changes in weight  - watches wt at home with heart -per review of cards notes from 10/2011 has pretty significant chronic systolic HF with consideration of upgrade to CRT device - per Echo 12/01/11 severe LVF with EF <20% -cath on 12/05/11 - mod CAD of LCA, 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  Past Medical History  Diagnosis Date  . Alcohol abuse, episodic 05/19/2008  . ALLERGIC RHINITIS 04/15/2009  . COLONIC POLYPS, ADENOMATOUS, HX OF 02/14/2008  . DIVERTICULOSIS, COLON 02/14/2008  . ESOPHAGEAL STRICTURE 01/28/2008  . GERD 02/14/2008  . HYPERGLYCEMIA 11/16/2006  . HYPERLIPIDEMIA 02/14/2008  . INSOMNIA-SLEEP DISORDER-UNSPEC 02/11/2009  . 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 PROSTATE CANCER, HX OF 02/25/2000  . TRANSIENT ISCHEMIC ATTACKS, HX OF 11/16/2006  . Arthritis   . Atrial fibrillation 11/16/2006    remote CHF related to atrial fib with rapid ventricular response over 25 yrs per office note,  . ASTHMA 11/16/2006    sinusitis-    hx ?yeast patch/white patch on vocal cord as per [facility] 02/25/11-   . OBSTRUCTIVE SLEEP APNEA 11/10/2008  . SLEEP APNEA 10/03/2009    LOV  Dr Maple Hudson 12/12 in [facility]    Moderate per patient- settings ?6/last sleep study years ago  . HYPERTENSION 11/16/2006  . CORONARY ARTERY DISEASE 11/16/2006    nonischemic cardiomyapathy, last stress test 2011- LOV with clearance note Dr  Alanda Amass and EKG 1/13 on chart  . Symptomatic bradycardia, secondary to sinus node dysfunction 07/08/2011    s/p [employer] PPM by Dr Royann Shivers  . S/P cardiac pacemaker procedure, 07/08/11, [employer] 07/08/2011    Family History  Problem Relation Age of Onset  . Heart attack Father   . Heart attack Brother   . Heart disease Maternal Uncle   . Heart attack Maternal Uncle   . Colon cancer Neg Hx     History   Social History  . Marital Status: Married    Spouse Name: N/A    Number of Children: N/A  . Years of Education: N/A   Social History Main Topics  . Smoking status: Former Smoker    Types: Cigarettes    Quit date: 03/22/1964  . Smokeless tobacco: Former Neurosurgeon    Quit date: 05/10/1964  . Alcohol Use: 8.4 oz/week    14 Shots of liquor per week     Comment: 2 mixed drinks daily  . Drug Use: No  . Sexually Active: None   Other Topics Concern  . None   Social History Narrative  . None    Current outpatient prescriptions:acetaminophen (TYLENOL) 500 MG tablet, Take 500 mg by mouth  every 6 (six) hours as needed. For pain, Disp: , Rfl: ;  albuterol (PROVENTIL HFA;VENTOLIN HFA) 108 (90 BASE) MCG/ACT inhaler, Inhale 2 puffs into the lungs every 6 (six) hours as needed. For shortness of breath, Disp: , Rfl: ;  amiodarone (PACERONE) 200 MG tablet, Take 200 mg by mouth daily. , Disp: , Rfl:  amoxicillin-clavulanate (AUGMENTIN) 875-125 MG per tablet, Take 1 tablet by mouth 2 (two) times daily. x 10 days. Started on 12/16/11., Disp: , Rfl: ;  aspirin EC 81 MG tablet, Take 81 mg by mouth daily., Disp: , Rfl: ;  benzonatate (TESSALON) 100 MG capsule, Take 1 capsule (100 mg total) by mouth every 8 (eight) hours., Disp: 21 capsule, Rfl: 0;   LORazepam (ATIVAN) 0.5 MG tablet, Take 0.25 mg by mouth 2 (two) times daily. , Disp: , Rfl:  metoprolol succinate (TOPROL-XL) 50 MG 24 hr tablet, Take 50 mg by mouth 2 (two) times daily. Take with or immediately following a meal., Disp: , Rfl: ;  mometasone (NASONEX) 50 MCG/ACT nasal spray, Place 2 sprays into the nose at bedtime. , Disp: , Rfl: ;  pantoprazole (PROTONIX) 40 MG tablet, Take 40 mg by mouth daily., Disp: , Rfl: ;  ramipril (ALTACE) 10 MG capsule, Take 10 mg by mouth daily., Disp: , Rfl:  spironolactone (ALDACTONE) 25 MG tablet, Take 25 mg by mouth daily. , Disp: , Rfl: ;  warfarin (COUMADIN) 5 MG tablet, Take 5 mg by mouth daily., Disp: , Rfl: ;  zolpidem (AMBIEN) 5 MG tablet, Take 5 mg by mouth at bedtime., Disp: , Rfl:  Current facility-administered medications:levalbuterol (XOPENEX) nebulizer solution 0.63 mg, 0.63 mg, Nebulization, Once, Waymon Budge, MD  [facility] Vitals:   12/20/11 1337  BP: 122/90  Pulse: 73  Temp: 97.7 F (36.5 C)    There is no height on file to calculate BMI.  GENERAL: vitals reviewed and listed above, alert, oriented, appears well hydrated and in no acute distress  HEENT: atraumatic, conjunttiva clear, no obvious abnormalities on inspection of external nose and ears, clear rhinorrhea and PND  NECK: no obvious masses on inspection  LUNGS: clear to auscultation bilaterally, no wheezes, rales or rhonchi, good air movement  CV: HRRR, no peripheral edema  MS: moves all extremities without noticeable abnormality  PSYCH: pleasant and cooperative, no obvious depression or anxiety  ASSESSMENT AND PLAN:  Discussed the following assessment and plan:  1. Cough   2. Dyspnea    -patient reports he is feeling better, no rales/rhonchi/wheezes on my exam today with good air movement today, normal O2 sats, minimal LE edema, no JVD despite sig CHF per records -while on ROC prior to appt I was concerned that his CHF may have been playing a role in  his recent symptoms I am glad that he is improving with tx of PNA. Discussed this with pt and wife and they report cardiologist is aware of recent symptoms and they have an appt with cards next week and allergist in a few days -discussed return and ED precuations -Patient advised to return or notify a doctor immediately if symptoms worsen or persist or new concerns arise.  There are no Patient Instructions on file for this visit.   Kriste Basque R.

## 2011-12-20 NOTE — Patient Instructions (Signed)
-  complete course of antibiotics  -keep appointments with cardiologist and allergist  -schedule follow up with Dr. Cato Mulligan in next few months  -see a doctor immediately if chest pain, worsening shortness of breath, fevers or other concerns

## 2011-12-21 ENCOUNTER — Encounter (HOSPITAL_COMMUNITY): Payer: Medicare Other

## 2011-12-26 ENCOUNTER — Encounter: Payer: Self-pay | Admitting: Internal Medicine

## 2011-12-26 ENCOUNTER — Ambulatory Visit (INDEPENDENT_AMBULATORY_CARE_PROVIDER_SITE_OTHER): Payer: Medicare Other | Admitting: Internal Medicine

## 2011-12-26 ENCOUNTER — Encounter (HOSPITAL_COMMUNITY): Payer: Self-pay

## 2011-12-26 VITALS — BP 118/78 | HR 61 | Ht 72.0 in | Wt 186.8 lb

## 2011-12-26 DIAGNOSIS — Z8546 Personal history of malignant neoplasm of prostate: Secondary | ICD-10-CM | POA: Insufficient documentation

## 2011-12-26 DIAGNOSIS — Z8673 Personal history of transient ischemic attack (TIA), and cerebral infarction without residual deficits: Secondary | ICD-10-CM | POA: Insufficient documentation

## 2011-12-26 DIAGNOSIS — J309 Allergic rhinitis, unspecified: Secondary | ICD-10-CM

## 2011-12-26 DIAGNOSIS — J45909 Unspecified asthma, uncomplicated: Secondary | ICD-10-CM | POA: Insufficient documentation

## 2011-12-26 DIAGNOSIS — E785 Hyperlipidemia, unspecified: Secondary | ICD-10-CM | POA: Insufficient documentation

## 2011-12-26 DIAGNOSIS — Z950 Presence of cardiac pacemaker: Secondary | ICD-10-CM | POA: Insufficient documentation

## 2011-12-26 DIAGNOSIS — F1010 Alcohol abuse, uncomplicated: Secondary | ICD-10-CM | POA: Insufficient documentation

## 2011-12-26 DIAGNOSIS — Z5189 Encounter for other specified aftercare: Secondary | ICD-10-CM | POA: Insufficient documentation

## 2011-12-26 DIAGNOSIS — I10 Essential (primary) hypertension: Secondary | ICD-10-CM | POA: Insufficient documentation

## 2011-12-26 DIAGNOSIS — I4891 Unspecified atrial fibrillation: Secondary | ICD-10-CM | POA: Insufficient documentation

## 2011-12-26 DIAGNOSIS — K219 Gastro-esophageal reflux disease without esophagitis: Secondary | ICD-10-CM | POA: Insufficient documentation

## 2011-12-26 DIAGNOSIS — I495 Sick sinus syndrome: Secondary | ICD-10-CM | POA: Insufficient documentation

## 2011-12-26 DIAGNOSIS — I252 Old myocardial infarction: Secondary | ICD-10-CM | POA: Insufficient documentation

## 2011-12-26 DIAGNOSIS — I2510 Atherosclerotic heart disease of native coronary artery without angina pectoris: Secondary | ICD-10-CM | POA: Insufficient documentation

## 2011-12-26 DIAGNOSIS — I5022 Chronic systolic (congestive) heart failure: Secondary | ICD-10-CM | POA: Insufficient documentation

## 2011-12-26 NOTE — Progress Notes (Signed)
11/01/10-76 year old male with remote smoking history, followed for chronic asthmatic bronchitis/bronchiectasis, history of sinusitis, allergic rhinitis, sleep apnea Last here 03/15/10 He wants to wait until November for flu shot. He has not had recent or acute symptoms related to his breathing and has not used Advair in 6 months. He didn't find it helpful. Rare use of Proair, maybe once every 3-4 months. He continues allergy vaccine, missing a dose sometimes if he is away from town. He notices no problems and offers no questions. He continues to use CPAP all night every night. He had had a palatoplasty he doesn't remember when and wonders if this was done at the time of his tonsillectomy as a Alayha Babineaux child. He notices persistent narrowness through the right nostril related to his septal deviation. Nasonex helps. We discussed a trial of Breathe Right strips to be used under his CPAP mask. He can feel when his atrial fibrillation is more active, as in the past 2 days. He treats his difficulty initiating and maintaining sleep with a one half of a 5 mg Ambien tablet when needed.   11/01/10-76 year old male with remote smoking history, followed for chronic asthmatic bronchitis/bronchiectasis, history of sinusitis, allergic rhinitis, sleep apnea Walked in today as an acute unscheduled visit complaining of cough since lunchtime. He got a throat tickle walking to his car and got gradually worse. Persistent for 4 hours with chest congestion. Chronic feeling of raspy throat clearing. Orthopedic knee surgery one month ago/Dr. Tonita Cong. Oxycodone given in hospital cause shaking, insomnia, panic attack. When he got home he took some leftover hydrocodone and had the same experience, up all night, pacing and anxious. This is set off his chronic anxiety. Dr Tonita Cong called in lorazepam- helps. Continues CPAP all night every night.  05/16/11- 76 year old male with remote smoking history, followed for chronic asthmatic  bronchitis/bronchiectasis, history of sinusitis, allergic rhinitis, sleep apnea, AFib/ CAD/ MI, GERD He admits his anxiety is getting to him. 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 Apparently he was put to sleep with intention to remove the lesion but they found they could not. He is left persistently hoarse.  He had just had back surgery 8 weeks previously and has been taking oxycodone for back pain. Lorazepam helps him sleep at night.  CPAP continues all night every night with good control. 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  11/03/11- 76 year old male with remote smoking history, followed for chronic asthmatic bronchitis/bronchiectasis, history of sinusitis, allergic rhinitis, sleep apnea, AFib/ CAD/ MI, GERD He wants to wait until November for flu shot. Continues allergy vaccine 1:10 GH. He has not had much respiratory problem until the last one or 2 weeks when he began throat clearing. Has used his rescue inhaler only once or twice. In February he had spine surgery for spinal stenosis. Oxycodone caused panic attack lasting 3 months. Then had a vocal cord biopsy at [facility]. Exacerbation of atrial fibrillation treated with pacemaker in June, then amiodarone. He still feels more short of breath than normal but is starting cardiac rehabilitation. CPAP/ [facility].  12/26/11-  76 year old male with remote smoking history, followed for chronic asthmatic bronchitis/bronchiectasis, history of sinusitis, allergic rhinitis, sleep apnea, AFib/ CAD/ MI, GERD     Wife here FOLLOWS FOR: on last dose of Amoxicillin(dx'd with PNA at hospital 12-16-11; feels somewhat better but not 100%.  Still on allergy vaccine. Acute  illness is now improving. Antibiotic ends today. No fever, minimal phlegm, throat tickle, shortness of breath with sustained speech. Denies cough or wheeze. CXR  12/18/11- reviewed IMPRESSION:  Improved right lower lobe airspace disease. Small right effusion  noted.  Original Report Authenticated By: Holley Dexter, M.D.   ROS-see HPi Constitutional:   No-   weight loss, night sweats, fevers, chills, fatigue, lassitude. HEENT:   No-  headaches, difficulty swallowing, tooth/dental problems, sore throat,       No-  sneezing, itching, ear ache, nasal congestion, post nasal drip,  CV:  No-   chest pain, orthopnea, PND, swelling in lower extremities, anasarca, dizziness, palpitations Resp: No-   shortness of breath with exertion or at rest.              No-   productive cough,  + non-productive cough,  No-  coughing up of blood.              No-   change in color of mucus.  No- wheezing.   Skin: No-   rash or lesions. GI:  No-   heartburn, indigestion, abdominal pain, nausea, vomiting, GU: No-   . MS:  No-   joint pain or swelling.   Neuro- Nothing unusual Psych:  + change in mood or affect. No depression, + anxiety.  No memory loss.  OBJ BP 118/78  Pulse 61  Ht 6' (1.829 m)  Wt 186 lb 12.8 oz (84.732 kg)  BMI 25.33 kg/m2  SpO2 94% General- Alert, Oriented, Affect-sad, Distress- always somewhat anxious Skin- rash-none, lesions- none, excoriation- none Lymphadenopathy- none Head- atraumatic            Eyes- Gross vision intact, PERRLA, conjunctivae clear secretions            Ears- Hearing, canals-normal            Nose- Clear, ?Septal dev -narrower on the right, no-  mucus, polyps, erosion, perforation             Throat- Mallampati II/ s/p UPPP , mucosa clear , drainage- none, tonsils- atrophic. Very hoarse Neck- flexible , trachea midline, no stridor , thyroid nl, carotid no bruit Chest - symmetrical excursion , unlabored           Heart/CV- RRR , no murmur , no gallop  , no rub, nl s1 s2                           - JVD- none , edema- none, stasis changes- none, varices- none           Lung- +faint rales, wheeze- none, cough- none ,  dullness-none, rub- none           Chest wall- pacemaker Abd-  Br/ Gen/ Rectal- Not done, not indicated Extrem- cyanosis- none, clubbing, none, atrophy- none, strength- nl Neuro- +tremor, I notice repeated facial grimacing that almost suggests a tic, but it is variable.

## 2011-12-26 NOTE — Patient Instructions (Addendum)
Ok to continue your usual meds, resume cardiac rehab, gargle and use throat lozenges as needed  Please call as needed

## 2011-12-27 ENCOUNTER — Ambulatory Visit (INDEPENDENT_AMBULATORY_CARE_PROVIDER_SITE_OTHER): Payer: Medicare Other

## 2011-12-27 DIAGNOSIS — J309 Allergic rhinitis, unspecified: Secondary | ICD-10-CM | POA: Diagnosis not present

## 2011-12-27 DIAGNOSIS — Z23 Encounter for immunization: Secondary | ICD-10-CM

## 2011-12-28 ENCOUNTER — Encounter: Payer: Self-pay | Admitting: Internal Medicine

## 2011-12-28 ENCOUNTER — Ambulatory Visit (INDEPENDENT_AMBULATORY_CARE_PROVIDER_SITE_OTHER): Payer: Medicare Other | Admitting: Internal Medicine

## 2011-12-28 ENCOUNTER — Encounter (HOSPITAL_COMMUNITY)
Admission: RE | Admit: 2011-12-28 | Discharge: 2011-12-28 | Disposition: A | Discharge status: Home / Self Care | Payer: Self-pay | Source: Ambulatory Visit | Attending: Cardiovascular Disease | Admitting: Cardiovascular Disease

## 2011-12-28 VITALS — BP 124/68 | HR 75 | Ht 72.0 in | Wt 186.0 lb

## 2011-12-28 DIAGNOSIS — I5022 Chronic systolic (congestive) heart failure: Secondary | ICD-10-CM

## 2011-12-28 DIAGNOSIS — I4891 Unspecified atrial fibrillation: Secondary | ICD-10-CM | POA: Diagnosis not present

## 2011-12-28 DIAGNOSIS — I495 Sick sinus syndrome: Secondary | ICD-10-CM | POA: Diagnosis not present

## 2011-12-28 LAB — PACEMAKER DEVICE OBSERVATION
AL AMPLITUDE: 3.6 mv
AL THRESHOLD: 0.7 V
RV LEAD IMPEDENCE PM: 610 Ohm
RV LEAD THRESHOLD: 0.9 V

## 2011-12-28 NOTE — Patient Instructions (Signed)
Your physician recommends that you schedule a follow-up appointment as needed  

## 2011-12-28 NOTE — Assessment & Plan Note (Signed)
Normal pacemaker function See Arita Miss Art report No changes today As above

## 2011-12-28 NOTE — Assessment & Plan Note (Signed)
Clinically class III at this point.  EF remains < 20 % despite optimal medical therapy.  He has a LBBB (QRS 160 msec) and would be expected to possibly benefit from CRT.   Risks, benefits, alternatives to BiV ICD upgrade were discussed in detail with the patient today. The patient  understands that the risks include but are not limited to bleeding, infection, pneumothorax, perforation, tamponade, vascular damage, renal failure, MI, stroke, death, inappropriate shocks, and lead dislodgement and wishes to think about this further.  He will discuss with Dr Alanda Amass when he sees him tomorrow.  He is contemplating having a second opinion elsewhere.  I have encouraged him to think about this issue and to contact my office if he wishes to have his device upgraded by me.  Otherwise, he will continue to follow with Dr Alanda Amass and I will see as needed.

## 2011-12-28 NOTE — Assessment & Plan Note (Signed)
Maintaining sinus rhythm No changes today 

## 2011-12-28 NOTE — Progress Notes (Signed)
PCP:  SWORDS,BRUCE HENRY, MD Primary Cardiologist:  Dr Weintraub  The patient presents today for electrophysiology followup.  Since last being seen in our clinic, the patient reports doing reasonably well.  His exercise tolerance remains depressed.  He reports fatigue and SOB with moderate activity.  Recent echo (reviewed) by Dr Weintraub reveals EF < 20 % despite optimal medical therapy. Today, he denies symptoms of palpitations, chest pain,lower extremity edema, dizziness, presyncope, syncope, or neurologic sequela.  He remains very anxious.  The patient feels that he is tolerating medications without difficulties and is otherwise without complaint today.   Past Medical History  Diagnosis Date  . Alcohol abuse, episodic 05/19/2008  . ALLERGIC RHINITIS 04/15/2009  . COLONIC POLYPS, ADENOMATOUS, HX OF 02/14/2008  . DIVERTICULOSIS, COLON 02/14/2008  . ESOPHAGEAL STRICTURE 01/28/2008  . GERD 02/14/2008  . HYPERGLYCEMIA 11/16/2006  . HYPERLIPIDEMIA 02/14/2008  . INSOMNIA-SLEEP DISORDER-UNSPEC 02/11/2009  . 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 PROSTATE CANCER, HX OF 02/25/2000  . TRANSIENT ISCHEMIC ATTACKS, HX OF 11/16/2006  . Arthritis   . Atrial fibrillation 11/16/2006    remote CHF related to atrial fib with rapid ventricular response over 25 yrs per office note,  . ASTHMA 11/16/2006    sinusitis-    hx ?yeast patch/white patch on vocal cord as per [facility] 02/25/11-   . OBSTRUCTIVE SLEEP APNEA 11/10/2008  . SLEEP APNEA 10/03/2009    LOV Dr Young 12/12 in EP[facility]    Moderate per patient- settings ?6/last sleep study years ago  . HYPERTENSION 11/16/2006  . CORONARY ARTERY DISEASE 11/16/2006    nonischemic cardiomyapathy, last stress test 2011- LOV with clearance note Dr  Weintraub and EKG 1/13 on chart  . Symptomatic bradycardia, secondary to sinus node dysfunction 07/08/2011    s/p [employer] PPM by Dr Croitoru  . S/P cardiac pacemaker procedure, 07/08/11, [employer] Devise 07/08/2011    Past Surgical History  Procedure Date  . Tonsilectomy, adenoidectomy, bilateral myringotomy and tubes   . Knee arthroscopy     2 surgeries right and 1 left  . Prostate surgery 2/02    prostate cancer  . Uvulopalatopharyngoplasty   . Cardiac catheterization   . Tonsillectomy   . Esophagogastroduodenoscopy     with dilitation  . Lumbar laminectomy/decompression microdiscectomy 03/02/2011    Procedure: LUMBAR LAMINECTOMY/DECOMPRESSION MICRODISCECTOMY;  Surgeon: Jeffrey C Beane, MD;  Location: [facility];  Service: Orthopedics;  Laterality: N/A;  Decompression Lumbar 4 - Lumbar(X-Ray)  . Pacemaker insertion 06/2011    [employer] PPM implanted by Dr Croitoru    Current Outpatient Prescriptions  Medication Sig Dispense Refill  . acetaminophen (TYLENOL) 500 MG tablet Take 500 mg by mouth every 6 (six) hours as needed. For pain      . albuterol (PROVENTIL HFA;VENTOLIN HFA) 108 (90 BASE) MCG/ACT inhaler Inhale 2 puffs into the lungs every 6 (six) hours as needed. For shortness of breath      . amiodarone (PACERONE) 200 MG tablet Take 200 mg by mouth daily.       . aspirin EC 81 MG tablet Take 81 mg by mouth daily.      . LORazepam (ATIVAN) 0.5 MG tablet Take 0.25 mg by mouth at bedtime and may repeat dose one time if needed.       . metoprolol succinate (TOPROL-XL) 50 MG 24 hr tablet Take 50 mg by mouth 2 (two) times daily. Take with or immediately following a meal.      . mometasone (NASONEX) 50 MCG/ACT nasal   spray Place 2 sprays into the nose at bedtime.       . pantoprazole (PROTONIX) 40 MG tablet Take 40 mg by mouth daily.      . ramipril (ALTACE) 10 MG capsule Take 10 mg by mouth daily.      . spironolactone (ALDACTONE) 25 MG tablet Take 25 mg by mouth daily.       . warfarin (COUMADIN) 5 MG tablet Take 5 mg by mouth daily.      . zolpidem (AMBIEN) 5 MG tablet Take 5 mg by mouth at bedtime.      . amoxicillin-clavulanate (AUGMENTIN) 875-125 MG per tablet Take 1 tablet by mouth 2 (two)  times daily. x 10 days. Started on 12/16/11.       Current Facility-Administered Medications  Medication Dose Route Frequency Provider Last Rate Last Dose  . levalbuterol (XOPENEX) nebulizer solution 0.63 mg  0.63 mg Nebulization Once Clinton D Young, MD        Allergies  Allergen Reactions  . Hydrocodone Other (See Comments)    Severe panic attacks  . Oxycodone Other (See Comments)    Severe panic attacks    History   Social History  . Marital Status: Married    Spouse Name: N/A    Number of Children: N/A  . Years of Education: N/A   Occupational History  . Not on file.   Social History Main Topics  . Smoking status: Former Smoker    Types: Cigarettes    Quit date: 03/22/1964  . Smokeless tobacco: Former User    Quit date: 05/10/1964  . Alcohol Use: 8.4 oz/week    14 Shots of liquor per week     Comment: 2 mixed drinks daily  . Drug Use: No  . Sexually Active: Not on file   Other Topics Concern  . Not on file   Social History Narrative  . No narrative on file    Family History  Problem Relation Age of Onset  . Heart attack Father   . Heart attack Brother   . Heart disease Maternal Uncle   . Heart attack Maternal Uncle   . Colon cancer Neg Hx     Physical Exam: Filed Vitals:   12/28/11 0913  BP: 124/68  Pulse: 75  Height: 6' (1.829 m)  Weight: 186 lb (84.369 kg)  SpO2: 98%    GEN- The patient is well appearing, alert and oriented x 3 today.   Head- normocephalic, atraumatic Eyes-  Sclera clear, conjunctiva pink Ears- hearing intact Oropharynx- clear Neck- supple,   Lungs- Clear to ausculation bilaterally, normal work of breathing Chest- pacemaker pocket is well healed Heart- Regular rate and rhythm, no murmurs, rubs or gallops, PMI not laterally displaced GI- soft, NT, ND, + BS Extremities- no clubbing, cyanosis, or edema  Pacemaker interrogation- reviewed in detail today,  See PACEART report ekg today reveals atrial pacing at 67 bpm, LBBB  (QRS 166 msec)  Assessment and Plan:  

## 2011-12-29 ENCOUNTER — Other Ambulatory Visit: Payer: Self-pay | Admitting: *Deleted

## 2011-12-29 ENCOUNTER — Telehealth: Payer: Self-pay | Admitting: Internal Medicine

## 2011-12-29 ENCOUNTER — Encounter: Payer: Self-pay | Admitting: *Deleted

## 2011-12-29 DIAGNOSIS — I447 Left bundle-branch block, unspecified: Secondary | ICD-10-CM | POA: Diagnosis not present

## 2011-12-29 DIAGNOSIS — I5022 Chronic systolic (congestive) heart failure: Secondary | ICD-10-CM

## 2011-12-29 DIAGNOSIS — I495 Sick sinus syndrome: Secondary | ICD-10-CM | POA: Diagnosis not present

## 2011-12-29 DIAGNOSIS — I4891 Unspecified atrial fibrillation: Secondary | ICD-10-CM | POA: Diagnosis not present

## 2011-12-29 DIAGNOSIS — Z7901 Long term (current) use of anticoagulants: Secondary | ICD-10-CM | POA: Diagnosis not present

## 2011-12-29 NOTE — Telephone Encounter (Signed)
F/u  Returning call back to nurse.  

## 2011-12-29 NOTE — Telephone Encounter (Signed)
Please call patient to see if he is wanting to schedule the procedure Dr Johney Frame spoke with him about yesterday

## 2011-12-30 ENCOUNTER — Encounter (HOSPITAL_COMMUNITY): Payer: Self-pay

## 2011-12-30 ENCOUNTER — Telehealth: Payer: Self-pay | Admitting: Internal Medicine

## 2011-12-30 NOTE — Telephone Encounter (Signed)
plz return call to pt wife [phone], wants to know if she can get device change out appnt scheduled earlier.

## 2012-01-02 ENCOUNTER — Encounter (HOSPITAL_COMMUNITY)
Admission: RE | Admit: 2012-01-02 | Discharge: 2012-01-02 | Disposition: A | Discharge status: Home / Self Care | Payer: Self-pay | Source: Ambulatory Visit | Attending: Cardiovascular Disease | Admitting: Cardiovascular Disease

## 2012-01-02 NOTE — Telephone Encounter (Signed)
Called and spoke with patient's wife  Let her know that we have this scheduled at the earliest timeslot

## 2012-01-02 NOTE — Progress Notes (Signed)
Patient reported feeling faint walking the track today at cardiac rehab maintenanceeand reported feeling faint on the nustep.  Blood pressure 98/58.  Sa02 96% on Room air.  Patient placed on the Zoll telemetry rhythm APaced rate 70.  Patient placed in the treatment room and given H20.  Jones Skene PAC called and notified.  Requested ortho static blood pressure.  Lying blood pressure 84/50,  Sitting blood pressure 98/60 heart rate 65.  Standing blood pressure 92/60.  Jones Skene PAC came by and examined the patient .  Bryan instructed Mr. Delarosa to hold his Lasix and Potassium for the next two days.  Patients wife [name] called and notified. Jones Skene [facility] said Mr Seydel is okay to go home. Patient's wife came to the hospital and drove Mr [place] home. Grace Bushy [facility] said Mr [place] is okay to return to exercise on Wednesday.

## 2012-01-04 ENCOUNTER — Encounter (HOSPITAL_COMMUNITY)
Admission: RE | Admit: 2012-01-04 | Discharge: 2012-01-04 | Disposition: A | Discharge status: Home / Self Care | Payer: Self-pay | Source: Ambulatory Visit | Attending: Cardiovascular Disease | Admitting: Cardiovascular Disease

## 2012-01-04 NOTE — Progress Notes (Signed)
Pt returned to exercise today and is asymptomatic at check-in, BP 122/70. Pt did hold Lasix as instructed on Monday by Joesph Fillers [facility] but took his Potassium. I contact [facility] and reported patient's disposition. Per Franky Macho, pt is to restart Lasix on Friday and to take it MWF. I relayed message to patient and instructed him to contact the cardiologist's office if he has any questions/concerns.

## 2012-01-05 NOTE — Assessment & Plan Note (Signed)
He does well, continuing allergy vaccine. Suggested gargle and throat lozenges.

## 2012-01-05 NOTE — Assessment & Plan Note (Addendum)
Resolving community-acquired pneumonia right lower lobe after Augmentin. We decided we do not need to repeat a chest x-ray this soon. Okay to resume cardiac rehabilitation

## 2012-01-06 ENCOUNTER — Encounter (HOSPITAL_COMMUNITY): Payer: Self-pay | Admitting: Respiratory Therapy

## 2012-01-06 ENCOUNTER — Encounter (HOSPITAL_COMMUNITY): Payer: Self-pay

## 2012-01-09 ENCOUNTER — Encounter: Payer: Self-pay | Admitting: Internal Medicine

## 2012-01-09 ENCOUNTER — Encounter (HOSPITAL_COMMUNITY)
Admission: RE | Admit: 2012-01-09 | Discharge: 2012-01-09 | Disposition: A | Discharge status: Home / Self Care | Payer: Self-pay | Source: Ambulatory Visit | Attending: Cardiovascular Disease | Admitting: Cardiovascular Disease

## 2012-01-09 ENCOUNTER — Other Ambulatory Visit (INDEPENDENT_AMBULATORY_CARE_PROVIDER_SITE_OTHER): Payer: Medicare Other

## 2012-01-09 DIAGNOSIS — I5022 Chronic systolic (congestive) heart failure: Secondary | ICD-10-CM

## 2012-01-09 LAB — CBC WITH DIFFERENTIAL/PLATELET
Basophils Relative: 1.3 % (ref 0.0–3.0)
Eosinophils Absolute: 0.2 10*3/uL (ref 0.0–0.7)
Eosinophils Relative: 4.1 % (ref 0.0–5.0)
HCT: 42.1 % (ref 39.0–52.0)
Hemoglobin: 13.9 g/dL (ref 13.0–17.0)
Lymphs Abs: 1.9 10*3/uL (ref 0.7–4.0)
MCHC: 33.1 g/dL (ref 30.0–36.0)
MCV: 80.3 fl (ref 78.0–100.0)
Monocytes Absolute: 0.5 10*3/uL (ref 0.1–1.0)
Neutro Abs: 2 10*3/uL (ref 1.4–7.7)
Neutrophils Relative %: 42.5 % — ABNORMAL LOW (ref 43.0–77.0)
RBC: 5.25 Mil/uL (ref 4.22–5.81)
WBC: 4.6 10*3/uL (ref 4.5–10.5)

## 2012-01-09 LAB — BASIC METABOLIC PANEL
CO2: 28 mEq/L (ref 19–32)
Chloride: 101 mEq/L (ref 96–112)
Creatinine, Ser: 1.3 mg/dL (ref 0.4–1.5)
Potassium: 4.8 mEq/L (ref 3.5–5.1)

## 2012-01-11 ENCOUNTER — Encounter (HOSPITAL_COMMUNITY)
Admission: RE | Admit: 2012-01-11 | Discharge: 2012-01-11 | Disposition: A | Discharge status: Home / Self Care | Payer: Self-pay | Source: Ambulatory Visit | Attending: Cardiovascular Disease | Admitting: Cardiovascular Disease

## 2012-01-11 NOTE — Progress Notes (Signed)
Patient reported feeling lightheaded on the nustep today.  Sitting blood pressure 117/75.  Heart rate 65. Sitting blood pressure 120/76.  Standing blood pressure 117/75.  Sa02 99% on Room air.  No further complaints voiced.  Patient placed on the Zoll A paced 65.  Nada Boozer FNP-c called and notified of patient complaints.  Vernona Rieger said the patient is okay to go home.  Patient given H20.  Repeat standing blood pressure 113/70.  Patient asymptomatic at exit.

## 2012-01-11 NOTE — Progress Notes (Signed)
Faxed exercise flow sheets to Dr Kandis Cocking office for review.

## 2012-01-12 ENCOUNTER — Ambulatory Visit (INDEPENDENT_AMBULATORY_CARE_PROVIDER_SITE_OTHER): Payer: Medicare Other

## 2012-01-12 ENCOUNTER — Telehealth: Payer: Self-pay | Admitting: *Deleted

## 2012-01-12 DIAGNOSIS — J309 Allergic rhinitis, unspecified: Secondary | ICD-10-CM

## 2012-01-12 NOTE — Telephone Encounter (Signed)
Called patient

## 2012-01-13 ENCOUNTER — Encounter (HOSPITAL_COMMUNITY): Payer: Self-pay

## 2012-01-15 DIAGNOSIS — Z7901 Long term (current) use of anticoagulants: Secondary | ICD-10-CM | POA: Diagnosis not present

## 2012-01-15 DIAGNOSIS — I428 Other cardiomyopathies: Secondary | ICD-10-CM | POA: Diagnosis not present

## 2012-01-15 DIAGNOSIS — I4891 Unspecified atrial fibrillation: Secondary | ICD-10-CM | POA: Diagnosis not present

## 2012-01-15 DIAGNOSIS — G4733 Obstructive sleep apnea (adult) (pediatric): Secondary | ICD-10-CM | POA: Diagnosis not present

## 2012-01-15 DIAGNOSIS — Z4502 Encounter for adjustment and management of automatic implantable cardiac defibrillator: Secondary | ICD-10-CM | POA: Diagnosis not present

## 2012-01-15 DIAGNOSIS — Z8673 Personal history of transient ischemic attack (TIA), and cerebral infarction without residual deficits: Secondary | ICD-10-CM | POA: Diagnosis not present

## 2012-01-15 DIAGNOSIS — I2510 Atherosclerotic heart disease of native coronary artery without angina pectoris: Secondary | ICD-10-CM | POA: Diagnosis not present

## 2012-01-15 DIAGNOSIS — E785 Hyperlipidemia, unspecified: Secondary | ICD-10-CM | POA: Diagnosis not present

## 2012-01-15 DIAGNOSIS — I447 Left bundle-branch block, unspecified: Secondary | ICD-10-CM | POA: Diagnosis not present

## 2012-01-15 DIAGNOSIS — I10 Essential (primary) hypertension: Secondary | ICD-10-CM | POA: Diagnosis not present

## 2012-01-15 DIAGNOSIS — I509 Heart failure, unspecified: Secondary | ICD-10-CM | POA: Diagnosis not present

## 2012-01-15 MED ORDER — SODIUM CHLORIDE 0.9 % IR SOLN
80.0000 mg | Status: DC
  Filled 2012-01-15: qty 2

## 2012-01-15 MED ORDER — CEFAZOLIN SODIUM-DEXTROSE 2-3 GM-% IV SOLR
2.0000 g | INTRAVENOUS | Status: DC
  Filled 2012-01-15 (×2): qty 50

## 2012-01-16 ENCOUNTER — Encounter (HOSPITAL_COMMUNITY)
Admission: RE | Disposition: A | Discharge status: Home / Self Care | Payer: Self-pay | Source: Ambulatory Visit | Attending: Internal Medicine

## 2012-01-16 ENCOUNTER — Encounter (HOSPITAL_COMMUNITY): Payer: Self-pay | Admitting: *Deleted

## 2012-01-16 ENCOUNTER — Ambulatory Visit (HOSPITAL_COMMUNITY)
Admission: RE | Admit: 2012-01-16 | Discharge: 2012-01-17 | Disposition: A | Discharge status: Home / Self Care | Payer: Medicare Other | Source: Ambulatory Visit | Attending: Internal Medicine | Admitting: Internal Medicine

## 2012-01-16 ENCOUNTER — Encounter (HOSPITAL_COMMUNITY): Payer: Self-pay

## 2012-01-16 DIAGNOSIS — I495 Sick sinus syndrome: Secondary | ICD-10-CM | POA: Diagnosis present

## 2012-01-16 DIAGNOSIS — I5022 Chronic systolic (congestive) heart failure: Secondary | ICD-10-CM | POA: Diagnosis present

## 2012-01-16 DIAGNOSIS — Z4502 Encounter for adjustment and management of automatic implantable cardiac defibrillator: Secondary | ICD-10-CM | POA: Insufficient documentation

## 2012-01-16 DIAGNOSIS — I509 Heart failure, unspecified: Secondary | ICD-10-CM | POA: Diagnosis not present

## 2012-01-16 DIAGNOSIS — I10 Essential (primary) hypertension: Secondary | ICD-10-CM | POA: Insufficient documentation

## 2012-01-16 DIAGNOSIS — I428 Other cardiomyopathies: Secondary | ICD-10-CM | POA: Diagnosis not present

## 2012-01-16 DIAGNOSIS — G4733 Obstructive sleep apnea (adult) (pediatric): Secondary | ICD-10-CM | POA: Insufficient documentation

## 2012-01-16 DIAGNOSIS — I2510 Atherosclerotic heart disease of native coronary artery without angina pectoris: Secondary | ICD-10-CM | POA: Insufficient documentation

## 2012-01-16 DIAGNOSIS — I4891 Unspecified atrial fibrillation: Secondary | ICD-10-CM | POA: Insufficient documentation

## 2012-01-16 DIAGNOSIS — Z8673 Personal history of transient ischemic attack (TIA), and cerebral infarction without residual deficits: Secondary | ICD-10-CM | POA: Insufficient documentation

## 2012-01-16 DIAGNOSIS — I447 Left bundle-branch block, unspecified: Secondary | ICD-10-CM | POA: Diagnosis not present

## 2012-01-16 DIAGNOSIS — Z7901 Long term (current) use of anticoagulants: Secondary | ICD-10-CM | POA: Insufficient documentation

## 2012-01-16 DIAGNOSIS — E785 Hyperlipidemia, unspecified: Secondary | ICD-10-CM | POA: Diagnosis not present

## 2012-01-16 HISTORY — PX: CARDIAC DEFIBRILLATOR PLACEMENT: SHX171

## 2012-01-16 HISTORY — PX: BI-VENTRICULAR IMPLANTABLE CARDIOVERTER DEFIBRILLATOR UPGRADE: SHX5461

## 2012-01-16 LAB — SURGICAL PCR SCREEN
MRSA, PCR: NEGATIVE
Staphylococcus aureus: NEGATIVE

## 2012-01-16 SURGERY — BI-VENTRICULAR IMPLANTABLE CARDIOVERTER DEFIBRILLATOR UPGRADE
Anesthesia: LOCAL

## 2012-01-16 MED ORDER — LORAZEPAM 0.5 MG PO TABS
0.2500 mg | ORAL_TABLET | Freq: Every evening | ORAL | Status: DC | PRN
  Administered 2012-01-16 – 2012-01-17 (×2): 0.25 mg via ORAL
  Filled 2012-01-16 (×2): qty 1

## 2012-01-16 MED ORDER — SODIUM CHLORIDE 0.9 % IV SOLN: 250.0000 mL | INTRAVENOUS | Status: DC

## 2012-01-16 MED ORDER — WARFARIN SODIUM 5 MG PO TABS
5.0000 mg | ORAL_TABLET | Freq: Every day | ORAL | Status: DC
  Filled 2012-01-16: qty 1

## 2012-01-16 MED ORDER — SODIUM CHLORIDE 0.9 % IJ SOLN: 3.0000 mL | Freq: Two times a day (BID) | INTRAMUSCULAR | Status: DC

## 2012-01-16 MED ORDER — RAMIPRIL 10 MG PO CAPS
10.0000 mg | ORAL_CAPSULE | Freq: Every day | ORAL | Status: DC
  Filled 2012-01-16: qty 1

## 2012-01-16 MED ORDER — ASPIRIN EC 81 MG PO TBEC
81.0000 mg | DELAYED_RELEASE_TABLET | Freq: Every day | ORAL | Status: DC
  Administered 2012-01-16: 81 mg via ORAL
  Filled 2012-01-16 (×2): qty 1

## 2012-01-16 MED ORDER — WARFARIN SODIUM 5 MG PO TABS
5.0000 mg | ORAL_TABLET | Freq: Every day | ORAL | Status: DC
  Administered 2012-01-16: 5 mg via ORAL
  Filled 2012-01-16: qty 1

## 2012-01-16 MED ORDER — SODIUM CHLORIDE 0.45 % IV SOLN
INTRAVENOUS | Status: DC
  Administered 2012-01-16: 11:00:00 via INTRAVENOUS

## 2012-01-16 MED ORDER — ONDANSETRON HCL 4 MG/2ML IJ SOLN: 4.0000 mg | Freq: Four times a day (QID) | INTRAMUSCULAR | Status: DC | PRN

## 2012-01-16 MED ORDER — HEPARIN (PORCINE) IN NACL 2-0.9 UNIT/ML-% IJ SOLN
INTRAMUSCULAR | Status: AC
  Filled 2012-01-16: qty 500

## 2012-01-16 MED ORDER — HYDROMORPHONE HCL PF 1 MG/ML IJ SOLN
INTRAMUSCULAR | Status: AC
  Filled 2012-01-16: qty 1

## 2012-01-16 MED ORDER — PANTOPRAZOLE SODIUM 40 MG PO TBEC: 40.0000 mg | DELAYED_RELEASE_TABLET | Freq: Every day | ORAL | Status: DC

## 2012-01-16 MED ORDER — MIDAZOLAM HCL 5 MG/5ML IJ SOLN
INTRAMUSCULAR | Status: AC
  Filled 2012-01-16: qty 5

## 2012-01-16 MED ORDER — POTASSIUM CHLORIDE CRYS ER 20 MEQ PO TBCR
20.0000 meq | EXTENDED_RELEASE_TABLET | ORAL | Status: DC
  Administered 2012-01-16: 20 meq via ORAL
  Filled 2012-01-16: qty 1

## 2012-01-16 MED ORDER — SPIRONOLACTONE 25 MG PO TABS
25.0000 mg | ORAL_TABLET | Freq: Every day | ORAL | Status: DC
  Administered 2012-01-16: 25 mg via ORAL
  Filled 2012-01-16 (×2): qty 1

## 2012-01-16 MED ORDER — MUPIROCIN 2 % EX OINT
TOPICAL_OINTMENT | CUTANEOUS | Status: AC
  Administered 2012-01-16: 1 via NASAL
  Filled 2012-01-16: qty 22

## 2012-01-16 MED ORDER — METOPROLOL SUCCINATE ER 50 MG PO TB24
50.0000 mg | ORAL_TABLET | Freq: Two times a day (BID) | ORAL | Status: DC
  Administered 2012-01-16: 50 mg via ORAL
  Filled 2012-01-16 (×3): qty 1

## 2012-01-16 MED ORDER — AMIODARONE HCL 200 MG PO TABS
200.0000 mg | ORAL_TABLET | Freq: Every day | ORAL | Status: DC
  Filled 2012-01-16: qty 1

## 2012-01-16 MED ORDER — FENTANYL CITRATE 0.05 MG/ML IJ SOLN
INTRAMUSCULAR | Status: AC
  Filled 2012-01-16: qty 2

## 2012-01-16 MED ORDER — MUPIROCIN 2 % EX OINT
TOPICAL_OINTMENT | Freq: Two times a day (BID) | CUTANEOUS | Status: DC
  Administered 2012-01-16: 1 via NASAL

## 2012-01-16 MED ORDER — ZOLPIDEM TARTRATE 5 MG PO TABS
5.0000 mg | ORAL_TABLET | Freq: Every day | ORAL | Status: DC
Start: 2012-01-16 — End: 2012-01-17
  Administered 2012-01-16: 5 mg via ORAL
  Filled 2012-01-16: qty 1

## 2012-01-16 MED ORDER — SODIUM CHLORIDE 0.9 % IJ SOLN: 3.0000 mL | INTRAMUSCULAR | Status: DC | PRN

## 2012-01-16 MED ORDER — CEFAZOLIN SODIUM 1-5 GM-% IV SOLN
1.0000 g | Freq: Four times a day (QID) | INTRAVENOUS | Status: AC
  Administered 2012-01-16 – 2012-01-17 (×3): 1 g via INTRAVENOUS
  Filled 2012-01-16 (×4): qty 50

## 2012-01-16 MED ORDER — CHLORHEXIDINE GLUCONATE 4 % EX LIQD: 60.0000 mL | Freq: Once | CUTANEOUS | Status: DC

## 2012-01-16 MED ORDER — FUROSEMIDE 20 MG PO TABS
20.0000 mg | ORAL_TABLET | ORAL | Status: DC
  Administered 2012-01-16: 20 mg via ORAL
  Filled 2012-01-16: qty 1

## 2012-01-16 MED ORDER — METOPROLOL SUCCINATE ER 50 MG PO TB24
50.0000 mg | ORAL_TABLET | Freq: Two times a day (BID) | ORAL | Status: DC
  Filled 2012-01-16: qty 1

## 2012-01-16 MED ORDER — ACETAMINOPHEN 325 MG PO TABS
650.0000 mg | ORAL_TABLET | ORAL | Status: DC | PRN
  Administered 2012-01-16 – 2012-01-17 (×3): 650 mg via ORAL
  Filled 2012-01-16 (×4): qty 2

## 2012-01-16 MED ORDER — WARFARIN - PHYSICIAN DOSING INPATIENT: Freq: Every day | Status: DC

## 2012-01-16 MED ORDER — LIDOCAINE HCL (PF) 1 % IJ SOLN
INTRAMUSCULAR | Status: AC
  Filled 2012-01-16: qty 60

## 2012-01-16 NOTE — Op Note (Signed)
SURGEON:  Hillis Range, MD      PREPROCEDURE DIAGNOSES:   1. Nonischemic cardiomyopathy.   2. New York Heart Association class III, heart failure chronically.   3. Left bundle-branch block.   4. afib with tachycardia bradycardia syndrome     POSTPROCEDURE DIAGNOSES:   1. Nonischemic cardiomyopathy.   2. New York Heart Association class III heart failure chronically.   3. Left bundle-branch block.   4. afib with tachycardia bradycardia syndrome     PROCEDURES:    1. Left upper extremity venography  2. Pacemaker upgrade to a Biventricular ICD with removal of the previous ventricular pacing lead  3. Defibrillation threshold testing     INTRODUCTION:  Corey [name] is a 76 y.o. male with a nonischemic CM (EF 20%), NYHA Class III CHF, and LBBB QRS morophology. At this time, he meets MADIT II/ SCD-HeFT criteria for ICD implantation for primary prevention of sudden death.  Given LBBB, the patient may also be expected to benefit from resynchronization therapy.  He previously underwent PPM implantation for tachycardia/ bradycardia syndrome with afib. The patient has been treated with an optimal medical regimen but continues to have a depressed ejection fraction and NYHA Class III CHF symptoms.   he therefore  presents today for a biventricular ICD upgrade.      DESCRIPTION OF PROCEDURE:  Informed written consent was obtained and the patient was brought to the electrophysiology lab in the fasting state. The patient was adequately sedated with intravenous Versed, and fentanyl as outlined in the nursing report.  The patient's left chest was prepped and draped in the usual sterile fashion by the EP lab staff.  The skin overlying the existing pacemaker was infiltrated with lidocaine for local analgesia.  A 5-cm incision was made over the left deltopectoral region.  The pocket was revised to accommodate the new device using a combination of sharp and blunt dissection.  Electrocautery was used to assure  hemostasis. The previously implanted [employer] Advantio DR [phone]) pacemaker was removed from the body and disconnected from the leads.  The right atrial lead was examined and its integrety was confirmed to be in tact.  This lead was a Armed forces operational officer (SN [id number]) lead implanted 07/08/11.  The patient presented in sinus rhythm today.  P waves measured 3.71mV with an impedance of 626 ohms and a threshold of 0.7V @0 . .    Left Upper extremity Venography:  A venogram of the left upper extremity was performed which revealed a patent and large sized left axillary vein which emptied into a moderate sized left subclavian vein.    New RV Lead Placement: The left axillary vein was cannulated with fluoroscopic visualization.   Through the left axillary vein, a St. Jude Medical [place], model 1914-78 (serial number [id number]) right ventricular defibrillator lead were advanced with fluoroscopic visualization into the right ventricular apex position.  In this location R waves measured 9.7 mV with an impedance of 702 Ohms and a threshold of 0.7V @0 . .  Removal of the old RV Pacing Lead: As the previously implanted RV lead was less than 75 year old, I elected to remove the lead today.  2 separate silk sutures were identified and removed which were securing the RV lead to the pocket.  The lead was confirmed to be a [employer] model [id number] lead (SN [id number]) implanted 07/08/11.  The active helix was retracted and the lead was very easily removed using gentle manual traction.  The patient remained hemodynamically stable with  this.  LV Lead Placement: A Medtronic MB-2 guide was advanced through the left axillary vein into the low lateral right atrium.  A Bard curved Damato catheter was introduced through the MB-2 guide and used to cannulate the coronary sinus.  Coronary sinus cannulation was confirmed with a non selective coronary sinus venogram which was performed by hand injection of  nonionic contrast.  This demonstrated a moderate sized coronary sinus branch with a small distal lateral branch.  A Whisper CSJ wire was introduced through the transseptal sheath and advanced into the distal lateral branch.  A St. Jude Medical Quartet model [phone] (serial number [id number]) lead was advanced through the MB-2 into the distal lateral branch.   This was  approximately one-thirds from the base to the apex in a very lateral position.  In this location, the left ventricular lead R-waves measured  10 mV with impedance of  900 ohms and a threshold of 0.6 volt at 0.5  milliseconds in the bipolar configuration with no diaphragmatic  stimulation observed when pacing at 10 volts output.  The MB-2 guide was  therefore removed.    All three leads were secured to the pectoralis  fascia using #2 silk suture over the suture sleeves.  The pocket then  irrigated with copious gentamicin solution.  The leads were then  connected to a St. Jude Medical Unify model CD [phone] (serial  Number [id number]) biventricular ICD.  The defibrillator was placed into the  pocket.  The pocket was then closed in 2 layers with 2.0 Vicryl suture  for the subcutaneous and subcuticular layers.  Steri-Strips and a  sterile dressing were then applied.   DFT Testing: The patient converted to atrial fibrillation during the case but was clearly in sinus rhythm when the case began.  I therefore felt that DFT testing was safe to be performed.  Ventricular fibrillation was induced with a T shock.  Adequate sensing of ventricular  fibrillation was observed with minimal dropout with a programmed sensitivity of 1.61mV.  The patient was successfully defibrillated to sinus rhythm with a single 15 joules shock delivered from the device with an impedance of 77 ohms in a duration of 6 seconds.  The patient remained in sinus rhythm thereafter.  There were no early apparent complications.     CONCLUSIONS:   1. Nonischemic cardiomyopathy with Left  bundle-branch block and chronic New York Heart Association class III heart failure.   2. Successful biventricular ICD upgrade with removal of the previously implanted RV lead.   3. DFT less than or equal to 15 joules.   4. No early apparent complications.

## 2012-01-16 NOTE — H&P (View-Only) (Signed)
PCP:  Judie Petit, MD Primary Cardiologist:  Dr Alanda Amass  The patient presents today for electrophysiology followup.  Since last being seen in our clinic, the patient reports doing reasonably well.  His exercise tolerance remains depressed.  He reports fatigue and SOB with moderate activity.  Recent echo (reviewed) by Dr Alanda Amass reveals EF < 20 % despite optimal medical therapy. Today, he denies symptoms of palpitations, chest pain,lower extremity edema, dizziness, presyncope, syncope, or neurologic sequela.  He remains very anxious.  The patient feels that he is tolerating medications without difficulties and is otherwise without complaint today.   Past Medical History  Diagnosis Date  . Alcohol abuse, episodic 05/19/2008  . ALLERGIC RHINITIS 04/15/2009  . COLONIC POLYPS, ADENOMATOUS, HX OF 02/14/2008  . DIVERTICULOSIS, COLON 02/14/2008  . ESOPHAGEAL STRICTURE 01/28/2008  . GERD 02/14/2008  . HYPERGLYCEMIA 11/16/2006  . HYPERLIPIDEMIA 02/14/2008  . INSOMNIA-SLEEP DISORDER-UNSPEC 02/11/2009  . 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 PROSTATE CANCER, HX OF 02/25/2000  . TRANSIENT ISCHEMIC ATTACKS, HX OF 11/16/2006  . Arthritis   . Atrial fibrillation 11/16/2006    remote CHF related to atrial fib with rapid ventricular response over 25 yrs per office note,  . ASTHMA 11/16/2006    sinusitis-    hx ?yeast patch/white patch on vocal cord as per [facility] 02/25/11-   . OBSTRUCTIVE SLEEP APNEA 11/10/2008  . SLEEP APNEA 10/03/2009    LOV Dr Maple Hudson 12/12 in [facility]    Moderate per patient- settings ?6/last sleep study years ago  . HYPERTENSION 11/16/2006  . CORONARY ARTERY DISEASE 11/16/2006    nonischemic cardiomyapathy, last stress test 2011- LOV with clearance note Dr  Alanda Amass and EKG 1/13 on chart  . Symptomatic bradycardia, secondary to sinus node dysfunction 07/08/2011    s/p [employer] PPM by Dr Royann Shivers  . S/P cardiac pacemaker procedure, 07/08/11, [employer] 07/08/2011    Past Surgical History  Procedure Date  . Tonsilectomy, adenoidectomy, bilateral myringotomy and tubes   . Knee arthroscopy     2 surgeries right and 1 left  . Prostate surgery 2/02    prostate cancer  . Uvulopalatopharyngoplasty   . Cardiac catheterization   . Tonsillectomy   . Esophagogastroduodenoscopy     with dilitation  . Lumbar laminectomy/decompression microdiscectomy 03/02/2011    Procedure: LUMBAR LAMINECTOMY/DECOMPRESSION MICRODISCECTOMY;  Surgeon: Javier Docker, MD;  Location: [facility];  Service: Orthopedics;  Laterality: N/A;  Decompression Lumbar 4 - Lumbar(X-Ray)  . Pacemaker insertion 06/2011    [employer] PPM implanted by Dr Royann Shivers    Current Outpatient Prescriptions  Medication Sig Dispense Refill  . acetaminophen (TYLENOL) 500 MG tablet Take 500 mg by mouth every 6 (six) hours as needed. For pain      . albuterol (PROVENTIL HFA;VENTOLIN HFA) 108 (90 BASE) MCG/ACT inhaler Inhale 2 puffs into the lungs every 6 (six) hours as needed. For shortness of breath      . amiodarone (PACERONE) 200 MG tablet Take 200 mg by mouth daily.       Marland Kitchen aspirin EC 81 MG tablet Take 81 mg by mouth daily.      Marland Kitchen LORazepam (ATIVAN) 0.5 MG tablet Take 0.25 mg by mouth at bedtime and may repeat dose one time if needed.       . metoprolol succinate (TOPROL-XL) 50 MG 24 hr tablet Take 50 mg by mouth 2 (two) times daily. Take with or immediately following a meal.      . mometasone (NASONEX) 50 MCG/ACT nasal  spray Place 2 sprays into the nose at bedtime.       . pantoprazole (PROTONIX) 40 MG tablet Take 40 mg by mouth daily.      . ramipril (ALTACE) 10 MG capsule Take 10 mg by mouth daily.      Marland Kitchen spironolactone (ALDACTONE) 25 MG tablet Take 25 mg by mouth daily.       Marland Kitchen warfarin (COUMADIN) 5 MG tablet Take 5 mg by mouth daily.      Marland Kitchen zolpidem (AMBIEN) 5 MG tablet Take 5 mg by mouth at bedtime.      Marland Kitchen amoxicillin-clavulanate (AUGMENTIN) 875-125 MG per tablet Take 1 tablet by mouth 2 (two)  times daily. x 10 days. Started on 12/16/11.       Current Facility-Administered Medications  Medication Dose Route Frequency Provider Last Rate Last Dose  . levalbuterol (XOPENEX) nebulizer solution 0.63 mg  0.63 mg Nebulization Once Waymon Budge, MD        Allergies  Allergen Reactions  . Hydrocodone Other (See Comments)    Severe panic attacks  . Oxycodone Other (See Comments)    Severe panic attacks    History   Social History  . Marital Status: Married    Spouse Name: N/A    Number of Children: N/A  . Years of Education: N/A   Occupational History  . Not on file.   Social History Main Topics  . Smoking status: Former Smoker    Types: Cigarettes    Quit date: 03/22/1964  . Smokeless tobacco: Former Neurosurgeon    Quit date: 05/10/1964  . Alcohol Use: 8.4 oz/week    14 Shots of liquor per week     Comment: 2 mixed drinks daily  . Drug Use: No  . Sexually Active: Not on file   Other Topics Concern  . Not on file   Social History Narrative  . No narrative on file    Family History  Problem Relation Age of Onset  . Heart attack Father   . Heart attack Brother   . Heart disease Maternal Uncle   . Heart attack Maternal Uncle   . Colon cancer Neg Hx     Physical Exam: Filed Vitals:   12/28/11 0913  BP: 124/68  Pulse: 75  Height: 6' (1.829 m)  Weight: 186 lb (84.369 kg)  SpO2: 98%    GEN- The patient is well appearing, alert and oriented x 3 today.   Head- normocephalic, atraumatic Eyes-  Sclera clear, conjunctiva pink Ears- hearing intact Oropharynx- clear Neck- supple,   Lungs- Clear to ausculation bilaterally, normal work of breathing Chest- pacemaker pocket is well healed Heart- Regular rate and rhythm, no murmurs, rubs or gallops, PMI not laterally displaced GI- soft, NT, ND, + BS Extremities- no clubbing, cyanosis, or edema  Pacemaker interrogation- reviewed in detail today,  See PACEART report ekg today reveals atrial pacing at 67 bpm, LBBB  (QRS 166 msec)  Assessment and Plan:

## 2012-01-16 NOTE — Progress Notes (Signed)
B/P = 89/70.  Dr. Johney Frame @ bedside and order given to hold Metoprolol for SBP < 100.  Due at 2200.

## 2012-01-16 NOTE — Interval H&P Note (Signed)
History and Physical Interval Note:  01/16/2012 11:00 AM  Corey [name]  has presented today for surgery, with the diagnosis of hf  The various methods of treatment have been discussed with the patient and family. After consideration of risks, benefits and other options for treatment, the patient has consented to  Procedure(s) (LRB) with comments: BI-VENTRICULAR IMPLANTABLE CARDIOVERTER DEFIBRILLATOR UPGRADE (N/A) as a surgical intervention .  The patient's history has been reviewed, patient examined, no change in status, stable for surgery.  I have reviewed the patient's chart and labs.  Questions were answered to the patient's satisfaction.     Corey [name]  The patient has persistent EF <20% despite optimal medical therapy as well as LBBB.  At this time, he meets SCD-HeFT criteria for ICD implantation for primary prevention of sudden death.  Risks, benefits, alternatives to BiV ICD upgrade were discussed in detail with the patient today. The patient  understands that the risks include but are not limited to bleeding, infection, pneumothorax, perforation, tamponade, vascular damage, renal failure, MI, stroke, death, inappropriate shocks, and lead dislodgement and wishes to proceed.

## 2012-01-17 ENCOUNTER — Telehealth: Payer: Self-pay | Admitting: Physician Assistant

## 2012-01-17 ENCOUNTER — Encounter (HOSPITAL_COMMUNITY): Payer: Self-pay | Admitting: *Deleted

## 2012-01-17 ENCOUNTER — Ambulatory Visit (HOSPITAL_COMMUNITY): Payer: Medicare Other

## 2012-01-17 DIAGNOSIS — Z4502 Encounter for adjustment and management of automatic implantable cardiac defibrillator: Secondary | ICD-10-CM | POA: Diagnosis not present

## 2012-01-17 DIAGNOSIS — I428 Other cardiomyopathies: Secondary | ICD-10-CM | POA: Diagnosis not present

## 2012-01-17 DIAGNOSIS — E785 Hyperlipidemia, unspecified: Secondary | ICD-10-CM | POA: Diagnosis not present

## 2012-01-17 DIAGNOSIS — I509 Heart failure, unspecified: Secondary | ICD-10-CM | POA: Diagnosis not present

## 2012-01-17 DIAGNOSIS — Z95810 Presence of automatic (implantable) cardiac defibrillator: Secondary | ICD-10-CM | POA: Diagnosis not present

## 2012-01-17 DIAGNOSIS — I4891 Unspecified atrial fibrillation: Secondary | ICD-10-CM | POA: Diagnosis not present

## 2012-01-17 DIAGNOSIS — I447 Left bundle-branch block, unspecified: Secondary | ICD-10-CM | POA: Diagnosis not present

## 2012-01-17 LAB — BASIC METABOLIC PANEL
Calcium: 8.8 mg/dL (ref 8.4–10.5)
GFR calc non Af Amer: 57 mL/min — ABNORMAL LOW (ref >90)
Glucose, Bld: 92 mg/dL (ref 70–99)
Sodium: 135 mEq/L (ref 135–145)

## 2012-01-17 LAB — PROTIME-INR
INR: 1.3 (ref 0.00–1.49)
Prothrombin Time: 15.9 seconds — ABNORMAL HIGH (ref 11.6–15.2)

## 2012-01-17 NOTE — Progress Notes (Signed)
   ELECTROPHYSIOLOGY ROUNDING NOTE    Patient Name: Corey [name] Date of Encounter: 01-17-2012    SUBJECTIVE:Patient feels well.  No chest pain or shortness of breath.  Moderate incisional soreness.  S/p CRTD upgrade from dual chamber pacemaker 01-16-2012  TELEMETRY: Reviewed telemetry pt in AV pacing  Physical Exam: Filed Vitals:   01/16/12 1900 01/16/12 2047 01/17/12 0226 01/17/12 0442  BP: 121/71 111/75 108/64 107/70  Pulse: 60 59 60 60  Temp:  98.1 F (36.7 C) 98 F (36.7 C) 98 F (36.7 C)  TempSrc:  Oral Oral Oral  Resp:      Height:      Weight:      SpO2:  96% 98% 97%    GEN- The patient is well appearing, alert and oriented x 3 today.   Head- normocephalic, atraumatic Eyes-  Sclera clear, conjunctiva pink Ears- hearing intact Oropharynx- clear Neck- supple  Lungs- Clear to ausculation bilaterally, normal work of breathing Heart- Regular rate and rhythm, no murmurs, rubs or gallops, PMI not laterally displaced GI- soft, NT, ND, + BS Extremities- no clubbing, cyanosis, or edema ICD pocket is without hematoma  LABS: Basic Metabolic Panel:  Basename 01/17/12 0555  NA 135  K 4.1  CL 99  CO2 24  GLUCOSE 92  BUN 18  CREATININE 1.22  CALCIUM 8.8  MG --  PHOS --   INR: 1.3  Radiology/Studies:    No obvious ptx, leads in stable position.  DEVICE INTERROGATION: Device interrogated by industry.  Lead values including impedence, sensing, threshold within normal values.     Assessment and Plan:  1. Nonischemic CM/ CHF Doing well s/p pacer upgrade to a BiV ICD Resume home medicines, including coumadin  Wound care, arm mobility, restrictions reviewed with patient.  Routine follow up scheduled.  Pt also has early follow up scheduled with Dr Alanda Amass.

## 2012-01-17 NOTE — Progress Notes (Signed)
Patient's IV and telemetry has been discontinued; patient verbalizes understanding of discharged instructions, patient is being discharged home with wife.  Lorretta Harp RN

## 2012-01-17 NOTE — Telephone Encounter (Signed)
Pt called in to report that he felt like his chest was moving. Was discharged today s/p ICD implantation. Feels a "bom-bom" when he places his hand on the side of his chest. He does not think his ICD has discharged but is feeling a strange movement. No acute CP or SOB. D/w Dr. Johney Frame who will speak with [employer] & will call patient to discuss plan further. Dayna Dunn PA-C

## 2012-01-17 NOTE — Discharge Summary (Signed)
ELECTROPHYSIOLOGY PROCEDURE DISCHARGE SUMMARY    Patient ID: Corey [name],  MRN: [id number], DOB/AGE: 07/19/1935 76 y.o.  Admit date: 01/16/2012 Discharge date: 01/17/2012  Primary Care Physician: Birdie Sons, MD Primary Cardiologist: Franchot Heidelberg, MD Electrophysiologist: Hillis Range, MD  Primary Discharge Diagnosis:  Non ischemic cardiomyopathy and heart failure status post upgrade of previously implanted dual chamber pacemaker to CRT-D this admission.   Secondary Discharge Diagnosis:  1.  Hyperlipidemia 2.  Atrial fibrillation 3.  Chronic anticoagulation with Warfarin 4.  Obstructive sleep apnea on CPAP 5.  Symptomatic bradycardia s/p PPM implant 06-2011  Procedures This Admission: 1.  Successful biventricular ICD upgrade with removal of the previously implanted RV lead on 01-16-2012.  DFT's were successful at 15J.  The patient received a STJ model number 3265 ICD with model number 7122 right ventricular lead, 4158 left ventricular lead.  The previously implanted RV pacing lead was explanted.  The previously implanted right atrial lead was retained (model 4136).  There were no early apparent complications.   Brief HPI: Corey [name] is a 76 year old male with a history of symptomatic bradycardia.  He is s/p PPM implant in June of this year.  Following that, the patient developed symptoms of heart failure.  His EF was found to be decreased at 20%.  He was referred for CRTD upgrade. Because of class III heart failure, LBBB, and an EF of 20%, this was recommended.  Risks, benefits, and alternatives were discussed with the patient who wished to proceed.   Hospital Course:  The patient was admitted and underwent upgrade of his device to a CRTD with details as outlined above.   He was monitored on telemetry overnight which demonstrated AV pacing.  Left chest was without hematoma or ecchymosis.  The device was interrogated and found to be functioning normally.  CXR was obtained and  demonstrated no pneumothorax status post device implantation.  Wound care, arm mobility, and restrictions were reviewed with the patient.  Dr Johney Frame examined the patient and considered them stable for discharge to home.    Discharge Vitals: Blood pressure 107/70, pulse 60, temperature 98 F (36.7 C), temperature source Oral, resp. rate 20, height 6' (1.829 m), weight 185 lb (83.915 kg), SpO2 97.00%.    Labs:   Lab Results  Component Value Date   WBC 4.6 01/09/2012   HGB 13.9 01/09/2012   HCT 42.1 01/09/2012   MCV 80.3 01/09/2012   PLT 229.0 01/09/2012    Lab 01/17/12 0555  NA 135  K 4.1  CL 99  CO2 24  BUN 18  CREATININE 1.22  CALCIUM 8.8  PROT --  BILITOT --  ALKPHOS --  ALT --  AST --  GLUCOSE 92    Discharge Medications:    Medication List     As of 01/17/2012  9:06 AM    TAKE these medications         acetaminophen 500 MG tablet   Commonly known as: TYLENOL   Take 500 mg by mouth every 6 (six) hours as needed. For pain      albuterol 108 (90 BASE) MCG/ACT inhaler   Commonly known as: PROVENTIL HFA;VENTOLIN HFA   Inhale 2 puffs into the lungs every 6 (six) hours as needed. For shortness of breath      amiodarone 200 MG tablet   Commonly known as: PACERONE   Take 200 mg by mouth daily.      aspirin EC 81 MG tablet   Take  81 mg by mouth daily.      furosemide 20 MG tablet   Commonly known as: LASIX   Take 20 mg by mouth every other day.      LORazepam 0.5 MG tablet   Commonly known as: ATIVAN   Take 0.25 mg by mouth at bedtime and may repeat dose one time if needed.      metoprolol succinate 50 MG 24 hr tablet   Commonly known as: TOPROL-XL   Take 50 mg by mouth 2 (two) times daily. Take with or immediately following a meal.      mometasone 50 MCG/ACT nasal spray   Commonly known as: NASONEX   Place 2 sprays into the nose at bedtime.      pantoprazole 40 MG tablet   Commonly known as: PROTONIX   Take 40 mg by mouth daily.      potassium  chloride SA 20 MEQ tablet   Commonly known as: K-DUR,KLOR-CON   Take 20 mEq by mouth every other day.      ramipril 10 MG capsule   Commonly known as: ALTACE   Take 10 mg by mouth daily.      spironolactone 25 MG tablet   Commonly known as: ALDACTONE   Take 25 mg by mouth daily.      warfarin 5 MG tablet   Commonly known as: COUMADIN   Take 2.5-5 mg by mouth daily. Take 5mg  on mondays. Take 2.5mg  the rest of the week      zolpidem 5 MG tablet   Commonly known as: AMBIEN   Take 5 mg by mouth at bedtime.        Disposition:      Discharge Orders    Future Appointments: Provider: Department: Dept Phone: Center:   01/20/2012 11:15 AM [facility] CARDIAC REHAB [phone] None   01/23/2012 11:15 AM [facility] CARDIAC REHAB [phone] None   01/26/2012 3:00 PM [facility] [address] [employer] [employer] [address]) [phone] [facility]   02/20/2012 8:45 AM Lindley Magnus, MD [facility] [place] [facility] [phone] [facility]   04/20/2012 9:15 AM Hillis Range, MD [place] [facility] Main Office [place]) [phone] [facility]   06/04/2012 2:30 PM Waymon Budge, MD [facility] [phone] None     Follow-up Information    Follow up with Hillis Range, MD. On 01/26/2012. (3pm)    Contact information:   [address], [address] [place] [address] [phone]       Follow up with Governor Rooks, MD. Schedule an appointment as soon as possible for a visit in 1 week. (For Coumadin follow-up (INR check))    Contact information:   [address] [place] [address] [phone]          Duration of Discharge Encounter: Greater than 30 minutes including physician time.  Hillis Range, MD

## 2012-01-19 DIAGNOSIS — R0602 Shortness of breath: Secondary | ICD-10-CM | POA: Diagnosis not present

## 2012-01-19 DIAGNOSIS — R6889 Other general symptoms and signs: Secondary | ICD-10-CM | POA: Diagnosis not present

## 2012-01-19 DIAGNOSIS — I447 Left bundle-branch block, unspecified: Secondary | ICD-10-CM | POA: Diagnosis not present

## 2012-01-19 DIAGNOSIS — R5381 Other malaise: Secondary | ICD-10-CM | POA: Diagnosis not present

## 2012-01-19 DIAGNOSIS — I509 Heart failure, unspecified: Secondary | ICD-10-CM | POA: Diagnosis not present

## 2012-01-20 ENCOUNTER — Encounter (HOSPITAL_COMMUNITY): Admission: RE | Admit: 2012-01-20 | Payer: Self-pay | Source: Ambulatory Visit

## 2012-01-23 ENCOUNTER — Encounter (HOSPITAL_COMMUNITY)
Admission: RE | Admit: 2012-01-23 | Discharge: 2012-01-23 | Disposition: A | Discharge status: Home / Self Care | Payer: Self-pay | Source: Ambulatory Visit | Attending: Cardiovascular Disease | Admitting: Cardiovascular Disease

## 2012-01-23 NOTE — Progress Notes (Signed)
Pt returned to exercise today after pacemaker upgrade to BiV ICD on 01/16/12. Pt tolerated walking and stationary bike with no arm utilization without c/o. BP and HR within normal limits.

## 2012-01-26 ENCOUNTER — Encounter: Payer: Self-pay | Admitting: Internal Medicine

## 2012-01-26 ENCOUNTER — Ambulatory Visit (INDEPENDENT_AMBULATORY_CARE_PROVIDER_SITE_OTHER): Payer: Medicare Other

## 2012-01-26 ENCOUNTER — Other Ambulatory Visit: Payer: Self-pay | Admitting: Internal Medicine

## 2012-01-26 ENCOUNTER — Ambulatory Visit (INDEPENDENT_AMBULATORY_CARE_PROVIDER_SITE_OTHER): Payer: Medicare Other | Admitting: *Deleted

## 2012-01-26 DIAGNOSIS — I495 Sick sinus syndrome: Secondary | ICD-10-CM | POA: Diagnosis not present

## 2012-01-26 DIAGNOSIS — I498 Other specified cardiac arrhythmias: Secondary | ICD-10-CM | POA: Diagnosis not present

## 2012-01-26 DIAGNOSIS — J309 Allergic rhinitis, unspecified: Secondary | ICD-10-CM

## 2012-01-26 DIAGNOSIS — I4891 Unspecified atrial fibrillation: Secondary | ICD-10-CM | POA: Diagnosis not present

## 2012-01-26 DIAGNOSIS — R001 Bradycardia, unspecified: Secondary | ICD-10-CM

## 2012-01-26 DIAGNOSIS — Z7901 Long term (current) use of anticoagulants: Secondary | ICD-10-CM | POA: Diagnosis not present

## 2012-01-26 LAB — ICD DEVICE OBSERVATION
AL THRESHOLD: 0.5 V
ATRIAL PACING ICD: 98 pct
BAMS-0001: 150 {beats}/min
BAMS-0003: 70 {beats}/min
CHARGE TIME: 8.1 s
DEV-0020ICD: NEGATIVE
LV LEAD THRESHOLD: 0.5 V
PACEART VT: 0
RV LEAD IMPEDENCE ICD: 475 Ohm
RV LEAD THRESHOLD: 1 V
TOT-0006: 20131223000000
TOT-0009: 1
TZON-0005SLOWVT: 6
VENTRICULAR PACING ICD: 99.56 pct

## 2012-01-26 NOTE — Progress Notes (Signed)
defib check in clinic  

## 2012-01-27 ENCOUNTER — Encounter (HOSPITAL_COMMUNITY): Admission: RE | Admit: 2012-01-27 | Payer: Medicare Other | Source: Ambulatory Visit

## 2012-01-27 DIAGNOSIS — Z8546 Personal history of malignant neoplasm of prostate: Secondary | ICD-10-CM | POA: Insufficient documentation

## 2012-01-27 DIAGNOSIS — I252 Old myocardial infarction: Secondary | ICD-10-CM | POA: Insufficient documentation

## 2012-01-27 DIAGNOSIS — I2510 Atherosclerotic heart disease of native coronary artery without angina pectoris: Secondary | ICD-10-CM | POA: Insufficient documentation

## 2012-01-27 DIAGNOSIS — Z5189 Encounter for other specified aftercare: Secondary | ICD-10-CM | POA: Insufficient documentation

## 2012-01-27 DIAGNOSIS — J45909 Unspecified asthma, uncomplicated: Secondary | ICD-10-CM | POA: Insufficient documentation

## 2012-01-27 DIAGNOSIS — I10 Essential (primary) hypertension: Secondary | ICD-10-CM | POA: Insufficient documentation

## 2012-01-27 DIAGNOSIS — I4891 Unspecified atrial fibrillation: Secondary | ICD-10-CM | POA: Insufficient documentation

## 2012-01-27 DIAGNOSIS — Z950 Presence of cardiac pacemaker: Secondary | ICD-10-CM | POA: Insufficient documentation

## 2012-01-27 DIAGNOSIS — I5022 Chronic systolic (congestive) heart failure: Secondary | ICD-10-CM | POA: Insufficient documentation

## 2012-01-27 DIAGNOSIS — F1010 Alcohol abuse, uncomplicated: Secondary | ICD-10-CM | POA: Insufficient documentation

## 2012-01-27 DIAGNOSIS — K219 Gastro-esophageal reflux disease without esophagitis: Secondary | ICD-10-CM | POA: Insufficient documentation

## 2012-01-27 DIAGNOSIS — I495 Sick sinus syndrome: Secondary | ICD-10-CM | POA: Insufficient documentation

## 2012-01-27 DIAGNOSIS — E785 Hyperlipidemia, unspecified: Secondary | ICD-10-CM | POA: Insufficient documentation

## 2012-01-27 DIAGNOSIS — Z8673 Personal history of transient ischemic attack (TIA), and cerebral infarction without residual deficits: Secondary | ICD-10-CM | POA: Insufficient documentation

## 2012-01-30 ENCOUNTER — Encounter (HOSPITAL_COMMUNITY)
Admission: RE | Admit: 2012-01-30 | Discharge: 2012-01-30 | Disposition: A | Discharge status: Home / Self Care | Payer: Medicare Other | Source: Ambulatory Visit | Attending: Cardiovascular Disease | Admitting: Cardiovascular Disease

## 2012-02-01 ENCOUNTER — Encounter (HOSPITAL_COMMUNITY)
Admission: RE | Admit: 2012-02-01 | Discharge: 2012-02-01 | Disposition: A | Discharge status: Home / Self Care | Payer: Medicare Other | Source: Ambulatory Visit | Attending: Cardiovascular Disease | Admitting: Cardiovascular Disease

## 2012-02-03 ENCOUNTER — Encounter (HOSPITAL_COMMUNITY): Payer: Medicare Other

## 2012-02-06 ENCOUNTER — Encounter (HOSPITAL_COMMUNITY)
Admission: RE | Admit: 2012-02-06 | Discharge: 2012-02-06 | Disposition: A | Discharge status: Home / Self Care | Payer: Medicare Other | Source: Ambulatory Visit | Attending: Cardiovascular Disease | Admitting: Cardiovascular Disease

## 2012-02-08 ENCOUNTER — Encounter (HOSPITAL_COMMUNITY)
Admission: RE | Admit: 2012-02-08 | Discharge: 2012-02-08 | Disposition: A | Discharge status: Home / Self Care | Payer: Medicare Other | Source: Ambulatory Visit | Attending: Cardiovascular Disease | Admitting: Cardiovascular Disease

## 2012-02-09 ENCOUNTER — Ambulatory Visit (INDEPENDENT_AMBULATORY_CARE_PROVIDER_SITE_OTHER): Payer: Medicare Other

## 2012-02-09 DIAGNOSIS — Z7901 Long term (current) use of anticoagulants: Secondary | ICD-10-CM | POA: Diagnosis not present

## 2012-02-09 DIAGNOSIS — J309 Allergic rhinitis, unspecified: Secondary | ICD-10-CM

## 2012-02-09 DIAGNOSIS — I4891 Unspecified atrial fibrillation: Secondary | ICD-10-CM | POA: Diagnosis not present

## 2012-02-10 ENCOUNTER — Encounter (HOSPITAL_COMMUNITY): Payer: Medicare Other

## 2012-02-13 ENCOUNTER — Encounter (HOSPITAL_COMMUNITY)
Admission: RE | Admit: 2012-02-13 | Discharge: 2012-02-13 | Disposition: A | Discharge status: Home / Self Care | Payer: Medicare Other | Source: Ambulatory Visit | Attending: Cardiovascular Disease | Admitting: Cardiovascular Disease

## 2012-02-15 ENCOUNTER — Encounter (HOSPITAL_COMMUNITY): Payer: Medicare Other

## 2012-02-15 ENCOUNTER — Other Ambulatory Visit: Payer: Self-pay | Admitting: *Deleted

## 2012-02-15 MED ORDER — PANTOPRAZOLE SODIUM 40 MG PO TBEC: 40.0000 mg | DELAYED_RELEASE_TABLET | Freq: Every day | ORAL | Status: DC

## 2012-02-17 ENCOUNTER — Encounter (HOSPITAL_COMMUNITY): Payer: Medicare Other

## 2012-02-20 ENCOUNTER — Encounter (HOSPITAL_COMMUNITY)
Admission: RE | Admit: 2012-02-20 | Discharge: 2012-02-20 | Disposition: A | Discharge status: Home / Self Care | Payer: Medicare Other | Source: Ambulatory Visit | Attending: Cardiovascular Disease | Admitting: Cardiovascular Disease

## 2012-02-20 ENCOUNTER — Ambulatory Visit: Payer: Medicare Other | Admitting: Internal Medicine

## 2012-02-22 ENCOUNTER — Encounter: Payer: Self-pay | Admitting: Internal Medicine

## 2012-02-22 ENCOUNTER — Encounter (HOSPITAL_COMMUNITY)
Admission: RE | Admit: 2012-02-22 | Discharge: 2012-02-22 | Disposition: A | Discharge status: Home / Self Care | Payer: Medicare Other | Source: Ambulatory Visit | Attending: Cardiovascular Disease | Admitting: Cardiovascular Disease

## 2012-02-22 ENCOUNTER — Ambulatory Visit (INDEPENDENT_AMBULATORY_CARE_PROVIDER_SITE_OTHER): Payer: Medicare Other | Admitting: Internal Medicine

## 2012-02-22 VITALS — BP 152/94 | HR 72 | Temp 97.4°F | Wt 190.0 lb

## 2012-02-22 DIAGNOSIS — I10 Essential (primary) hypertension: Secondary | ICD-10-CM

## 2012-02-22 DIAGNOSIS — I4891 Unspecified atrial fibrillation: Secondary | ICD-10-CM

## 2012-02-22 DIAGNOSIS — I2510 Atherosclerotic heart disease of native coronary artery without angina pectoris: Secondary | ICD-10-CM | POA: Diagnosis not present

## 2012-02-22 NOTE — Progress Notes (Signed)
Pt c/o "skipped beats" at cardiac rehab.  Pt denies palpitations, dizziness, chest pain, dyspnea.  Pulse regular.  Pt placed on monitor.  Revealed AV pacing.  Pt continued to exercise without difficulty.  VSS.  Will continue to monitor.

## 2012-02-22 NOTE — Progress Notes (Signed)
Patient ID: Corey [name], male   DOB: 07/28/1935, 77 y.o.   MRN: [id number] Reviewed rececnt medical events--  Now has ICD-- feeling well ( has f/u with Alanda Amass)  Note spinal stenosis surgery in early 2013  Anxiety-- doing well with rare use of lorazepam  Reviewed pmh, psh, sochx, meds   patient denies chest pain, shortness of breath, orthopnea. Denies lower extremity edema, abdominal pain, change in appetite, change in bowel movements. Patient denies rashes, musculoskeletal complaints. No other specific complaints in a complete review of systems.    well-developed well-nourished male in no acute distress. HEENT exam atraumatic, normocephalic, neck supple without jugular venous distention. Chest clear to auscultation cardiac exam S1-S2 are regular. Abdominal exam overweight with bowel sounds, soft and nontender. Extremities no edema. Neurologic exam is alert with a normal gait.

## 2012-02-23 ENCOUNTER — Ambulatory Visit (INDEPENDENT_AMBULATORY_CARE_PROVIDER_SITE_OTHER): Payer: Medicare Other

## 2012-02-23 ENCOUNTER — Other Ambulatory Visit (HOSPITAL_COMMUNITY): Payer: Self-pay | Admitting: Cardiovascular Disease

## 2012-02-23 DIAGNOSIS — I447 Left bundle-branch block, unspecified: Secondary | ICD-10-CM

## 2012-02-23 DIAGNOSIS — Z950 Presence of cardiac pacemaker: Secondary | ICD-10-CM

## 2012-02-23 DIAGNOSIS — J309 Allergic rhinitis, unspecified: Secondary | ICD-10-CM | POA: Diagnosis not present

## 2012-02-23 DIAGNOSIS — I509 Heart failure, unspecified: Secondary | ICD-10-CM

## 2012-02-23 NOTE — Assessment & Plan Note (Signed)
Followed by Dr. Alanda Amass

## 2012-02-23 NOTE — Assessment & Plan Note (Signed)
BP Readings from Last 3 Encounters:  02/22/12 152/94  01/17/12 107/70  01/17/12 107/70   Not as well controlled. He will discuss with dr. Alanda Amass

## 2012-02-23 NOTE — Assessment & Plan Note (Signed)
No sxs Aggressive risk factor modification

## 2012-02-24 ENCOUNTER — Encounter (HOSPITAL_COMMUNITY): Payer: Medicare Other

## 2012-02-27 ENCOUNTER — Encounter (HOSPITAL_COMMUNITY)
Admission: RE | Admit: 2012-02-27 | Discharge: 2012-02-27 | Disposition: A | Discharge status: Home / Self Care | Payer: Medicare Other | Source: Ambulatory Visit | Attending: Cardiovascular Disease | Admitting: Cardiovascular Disease

## 2012-02-27 DIAGNOSIS — I2510 Atherosclerotic heart disease of native coronary artery without angina pectoris: Secondary | ICD-10-CM | POA: Insufficient documentation

## 2012-02-27 DIAGNOSIS — J45909 Unspecified asthma, uncomplicated: Secondary | ICD-10-CM | POA: Insufficient documentation

## 2012-02-27 DIAGNOSIS — I5022 Chronic systolic (congestive) heart failure: Secondary | ICD-10-CM | POA: Insufficient documentation

## 2012-02-27 DIAGNOSIS — I252 Old myocardial infarction: Secondary | ICD-10-CM | POA: Insufficient documentation

## 2012-02-27 DIAGNOSIS — Z8546 Personal history of malignant neoplasm of prostate: Secondary | ICD-10-CM | POA: Insufficient documentation

## 2012-02-27 DIAGNOSIS — K219 Gastro-esophageal reflux disease without esophagitis: Secondary | ICD-10-CM | POA: Insufficient documentation

## 2012-02-27 DIAGNOSIS — Z950 Presence of cardiac pacemaker: Secondary | ICD-10-CM | POA: Insufficient documentation

## 2012-02-27 DIAGNOSIS — F1010 Alcohol abuse, uncomplicated: Secondary | ICD-10-CM | POA: Insufficient documentation

## 2012-02-27 DIAGNOSIS — Z8673 Personal history of transient ischemic attack (TIA), and cerebral infarction without residual deficits: Secondary | ICD-10-CM | POA: Insufficient documentation

## 2012-02-27 DIAGNOSIS — I10 Essential (primary) hypertension: Secondary | ICD-10-CM | POA: Insufficient documentation

## 2012-02-27 DIAGNOSIS — E785 Hyperlipidemia, unspecified: Secondary | ICD-10-CM | POA: Insufficient documentation

## 2012-02-27 DIAGNOSIS — Z5189 Encounter for other specified aftercare: Secondary | ICD-10-CM | POA: Insufficient documentation

## 2012-02-27 DIAGNOSIS — I4891 Unspecified atrial fibrillation: Secondary | ICD-10-CM | POA: Insufficient documentation

## 2012-02-27 DIAGNOSIS — I495 Sick sinus syndrome: Secondary | ICD-10-CM | POA: Insufficient documentation

## 2012-02-29 ENCOUNTER — Ambulatory Visit: Payer: Medicare Other

## 2012-02-29 ENCOUNTER — Encounter (HOSPITAL_COMMUNITY)
Admission: RE | Admit: 2012-02-29 | Discharge: 2012-02-29 | Disposition: A | Discharge status: Home / Self Care | Payer: Medicare Other | Source: Ambulatory Visit | Attending: Cardiovascular Disease | Admitting: Cardiovascular Disease

## 2012-03-02 ENCOUNTER — Encounter (HOSPITAL_COMMUNITY): Payer: Medicare Other

## 2012-03-02 ENCOUNTER — Ambulatory Visit (INDEPENDENT_AMBULATORY_CARE_PROVIDER_SITE_OTHER): Payer: Medicare Other

## 2012-03-02 DIAGNOSIS — J309 Allergic rhinitis, unspecified: Secondary | ICD-10-CM

## 2012-03-05 ENCOUNTER — Encounter (HOSPITAL_COMMUNITY)
Admission: RE | Admit: 2012-03-05 | Discharge: 2012-03-05 | Disposition: A | Discharge status: Home / Self Care | Payer: Medicare Other | Source: Ambulatory Visit | Attending: Cardiovascular Disease | Admitting: Cardiovascular Disease

## 2012-03-05 NOTE — Progress Notes (Addendum)
Patient's weight is up 2.2 kg from Wed. 02/29/12. Pt denies edema, denies SOB. Per pt, he has been eating a lot over the weekend. Pt's SaO2 99% BP 138/80 HR 71. RN listened to pt's lungs, lungs clear. Will continue to monitor.

## 2012-03-06 ENCOUNTER — Ambulatory Visit (INDEPENDENT_AMBULATORY_CARE_PROVIDER_SITE_OTHER): Payer: Medicare Other

## 2012-03-06 DIAGNOSIS — J309 Allergic rhinitis, unspecified: Secondary | ICD-10-CM

## 2012-03-06 DIAGNOSIS — R0602 Shortness of breath: Secondary | ICD-10-CM | POA: Diagnosis not present

## 2012-03-06 DIAGNOSIS — R6889 Other general symptoms and signs: Secondary | ICD-10-CM | POA: Diagnosis not present

## 2012-03-07 ENCOUNTER — Encounter (HOSPITAL_COMMUNITY): Payer: Medicare Other

## 2012-03-09 ENCOUNTER — Encounter (HOSPITAL_COMMUNITY): Payer: Medicare Other

## 2012-03-09 ENCOUNTER — Ambulatory Visit: Payer: Medicare Other

## 2012-03-12 ENCOUNTER — Other Ambulatory Visit: Payer: Self-pay | Admitting: Dermatology

## 2012-03-12 ENCOUNTER — Encounter (HOSPITAL_COMMUNITY)
Admission: RE | Admit: 2012-03-12 | Discharge: 2012-03-12 | Disposition: A | Discharge status: Home / Self Care | Payer: Medicare Other | Source: Ambulatory Visit | Attending: Cardiovascular Disease | Admitting: Cardiovascular Disease

## 2012-03-12 DIAGNOSIS — Z7901 Long term (current) use of anticoagulants: Secondary | ICD-10-CM | POA: Diagnosis not present

## 2012-03-12 DIAGNOSIS — D485 Neoplasm of uncertain behavior of skin: Secondary | ICD-10-CM | POA: Diagnosis not present

## 2012-03-12 DIAGNOSIS — I4891 Unspecified atrial fibrillation: Secondary | ICD-10-CM | POA: Diagnosis not present

## 2012-03-12 DIAGNOSIS — L578 Other skin changes due to chronic exposure to nonionizing radiation: Secondary | ICD-10-CM | POA: Diagnosis not present

## 2012-03-12 DIAGNOSIS — L570 Actinic keratosis: Secondary | ICD-10-CM | POA: Diagnosis not present

## 2012-03-13 ENCOUNTER — Ambulatory Visit: Payer: Medicare Other

## 2012-03-14 ENCOUNTER — Encounter (HOSPITAL_COMMUNITY)
Admission: RE | Admit: 2012-03-14 | Discharge: 2012-03-14 | Disposition: A | Discharge status: Home / Self Care | Payer: Medicare Other | Source: Ambulatory Visit | Attending: Cardiovascular Disease | Admitting: Cardiovascular Disease

## 2012-03-14 ENCOUNTER — Inpatient Hospital Stay (HOSPITAL_COMMUNITY): Admission: RE | Admit: 2012-03-14 | Payer: Medicare Other | Source: Ambulatory Visit

## 2012-03-15 ENCOUNTER — Ambulatory Visit (INDEPENDENT_AMBULATORY_CARE_PROVIDER_SITE_OTHER): Payer: Medicare Other

## 2012-03-15 DIAGNOSIS — J309 Allergic rhinitis, unspecified: Secondary | ICD-10-CM

## 2012-03-16 ENCOUNTER — Encounter (HOSPITAL_COMMUNITY): Payer: Medicare Other

## 2012-03-19 ENCOUNTER — Encounter (HOSPITAL_COMMUNITY)
Admission: RE | Admit: 2012-03-19 | Discharge: 2012-03-19 | Disposition: A | Discharge status: Home / Self Care | Payer: Medicare Other | Source: Ambulatory Visit | Attending: Cardiovascular Disease | Admitting: Cardiovascular Disease

## 2012-03-19 ENCOUNTER — Encounter: Payer: Self-pay | Admitting: Internal Medicine

## 2012-03-21 ENCOUNTER — Encounter (HOSPITAL_COMMUNITY): Payer: Medicare Other

## 2012-03-23 ENCOUNTER — Ambulatory Visit (INDEPENDENT_AMBULATORY_CARE_PROVIDER_SITE_OTHER): Payer: Medicare Other

## 2012-03-23 ENCOUNTER — Encounter (HOSPITAL_COMMUNITY): Payer: Medicare Other

## 2012-03-23 ENCOUNTER — Telehealth: Payer: Self-pay | Admitting: Internal Medicine

## 2012-03-23 DIAGNOSIS — J309 Allergic rhinitis, unspecified: Secondary | ICD-10-CM

## 2012-03-23 MED ORDER — AZELASTINE-FLUTICASONE 137-50 MCG/ACT NA SUSP: 1.0000 | Freq: Every day | NASAL | Status: DC

## 2012-03-23 NOTE — Telephone Encounter (Signed)
Per CY-offer him sample of Dymista to try 1-2 sprays in each nostril every night at bedtime.

## 2012-03-23 NOTE — Telephone Encounter (Signed)
Pt states he is at the pharmacy awaiting a phone call from CY's nurse.  Antionette Fairy

## 2012-03-23 NOTE — Telephone Encounter (Signed)
Called and spoke with pt and he stated this started last night---allergies have been increased in the last couple of weeks.  Not able to work due to his nose running and his eyes watering.  He stated that he sneezing a lot.  Pt is requesting an appt today but is aware that CY has no openings today.  CY please advise. Thanks   Last ov---12/2011 Next ov--06/04/2012   Allergies  Allergen Reactions  . Hydrocodone Other (See Comments)    Severe panic attacks  . Oxycodone Other (See Comments)    Severe panic attacks

## 2012-03-23 NOTE — Telephone Encounter (Signed)
Dymista sample left for the pt to pick up and try.

## 2012-03-26 ENCOUNTER — Encounter (HOSPITAL_COMMUNITY)
Admission: RE | Admit: 2012-03-26 | Discharge: 2012-03-26 | Disposition: A | Discharge status: Home / Self Care | Payer: Medicare Other | Source: Ambulatory Visit | Attending: Cardiovascular Disease | Admitting: Cardiovascular Disease

## 2012-03-26 DIAGNOSIS — I4891 Unspecified atrial fibrillation: Secondary | ICD-10-CM | POA: Insufficient documentation

## 2012-03-26 DIAGNOSIS — F1010 Alcohol abuse, uncomplicated: Secondary | ICD-10-CM | POA: Insufficient documentation

## 2012-03-26 DIAGNOSIS — I495 Sick sinus syndrome: Secondary | ICD-10-CM | POA: Insufficient documentation

## 2012-03-26 DIAGNOSIS — Z5189 Encounter for other specified aftercare: Secondary | ICD-10-CM | POA: Insufficient documentation

## 2012-03-26 DIAGNOSIS — I252 Old myocardial infarction: Secondary | ICD-10-CM | POA: Insufficient documentation

## 2012-03-26 DIAGNOSIS — K219 Gastro-esophageal reflux disease without esophagitis: Secondary | ICD-10-CM | POA: Insufficient documentation

## 2012-03-26 DIAGNOSIS — J45909 Unspecified asthma, uncomplicated: Secondary | ICD-10-CM | POA: Insufficient documentation

## 2012-03-26 DIAGNOSIS — Z950 Presence of cardiac pacemaker: Secondary | ICD-10-CM | POA: Insufficient documentation

## 2012-03-26 DIAGNOSIS — E785 Hyperlipidemia, unspecified: Secondary | ICD-10-CM | POA: Insufficient documentation

## 2012-03-26 DIAGNOSIS — Z8673 Personal history of transient ischemic attack (TIA), and cerebral infarction without residual deficits: Secondary | ICD-10-CM | POA: Insufficient documentation

## 2012-03-26 DIAGNOSIS — I10 Essential (primary) hypertension: Secondary | ICD-10-CM | POA: Insufficient documentation

## 2012-03-26 DIAGNOSIS — Z8546 Personal history of malignant neoplasm of prostate: Secondary | ICD-10-CM | POA: Insufficient documentation

## 2012-03-26 DIAGNOSIS — I2510 Atherosclerotic heart disease of native coronary artery without angina pectoris: Secondary | ICD-10-CM | POA: Insufficient documentation

## 2012-03-26 DIAGNOSIS — I5022 Chronic systolic (congestive) heart failure: Secondary | ICD-10-CM | POA: Insufficient documentation

## 2012-03-28 ENCOUNTER — Encounter (HOSPITAL_COMMUNITY)
Admission: RE | Admit: 2012-03-28 | Discharge: 2012-03-28 | Disposition: A | Discharge status: Home / Self Care | Payer: Medicare Other | Source: Ambulatory Visit | Attending: Cardiovascular Disease | Admitting: Cardiovascular Disease

## 2012-03-29 ENCOUNTER — Encounter: Payer: Self-pay | Admitting: Adult Health

## 2012-03-29 ENCOUNTER — Ambulatory Visit (INDEPENDENT_AMBULATORY_CARE_PROVIDER_SITE_OTHER): Payer: Medicare Other | Admitting: Adult Health

## 2012-03-29 ENCOUNTER — Ambulatory Visit: Payer: Medicare Other | Admitting: Internal Medicine

## 2012-03-29 ENCOUNTER — Ambulatory Visit (INDEPENDENT_AMBULATORY_CARE_PROVIDER_SITE_OTHER): Payer: Medicare Other

## 2012-03-29 ENCOUNTER — Ambulatory Visit: Payer: Medicare Other | Admitting: Adult Health

## 2012-03-29 VITALS — BP 124/70 | HR 66 | Temp 97.3°F | Ht 72.0 in | Wt 191.6 lb

## 2012-03-29 DIAGNOSIS — J309 Allergic rhinitis, unspecified: Secondary | ICD-10-CM

## 2012-03-29 DIAGNOSIS — C61 Malignant neoplasm of prostate: Secondary | ICD-10-CM | POA: Diagnosis not present

## 2012-03-29 DIAGNOSIS — J45909 Unspecified asthma, uncomplicated: Secondary | ICD-10-CM | POA: Diagnosis not present

## 2012-03-29 NOTE — Patient Instructions (Addendum)
Claritin 10mg  daily As needed  Drainage  Saline nasal rinses As needed   Discuss with cardiologist regarding ACE - Ramipril causing throat tickle/cough  Would avoid ACE inibitors in future if possible  follow up Dr. Maple Hudson  As planned and As needed   Please contact office for sooner follow up if symptoms do not improve or worsen or seek emergency care

## 2012-03-29 NOTE — Progress Notes (Signed)
11/01/10-77 year old male with remote smoking history, followed for chronic asthmatic bronchitis/bronchiectasis, history of sinusitis, allergic rhinitis, sleep apnea Last here 03/15/10 He wants to wait until November for flu shot. He has not had recent or acute symptoms related to his breathing and has not used Advair in 6 months. He didn't find it helpful. Rare use of Proair, maybe once every 3-4 months. He continues allergy vaccine, missing a dose sometimes if he is away from town. He notices no problems and offers no questions. He continues to use CPAP all night every night. He had had a palatoplasty he doesn't remember when and wonders if this was done at the time of his tonsillectomy as a young child. He notices persistent narrowness through the right nostril related to his septal deviation. Nasonex helps. We discussed a trial of Breathe Right strips to be used under his CPAP mask. He can feel when his atrial fibrillation is more active, as in the past 2 days. He treats his difficulty initiating and maintaining sleep with a one half of a 5 mg Ambien tablet when needed.   11/01/10-77 year old male with remote smoking history, followed for chronic asthmatic bronchitis/bronchiectasis, history of sinusitis, allergic rhinitis, sleep apnea Walked in today as an acute unscheduled visit complaining of cough since lunchtime. He got a throat tickle walking to his car and got gradually worse. Persistent for 4 hours with chest congestion. Chronic feeling of raspy throat clearing. Orthopedic knee surgery one month ago/Dr. Tonita Cong. Oxycodone given in hospital cause shaking, insomnia, panic attack. When he got home he took some leftover hydrocodone and had the same experience, up all night, pacing and anxious. This is set off his chronic anxiety. Dr Tonita Cong called in lorazepam- helps. Continues CPAP all night every night.  05/16/11- 77 year old male with remote smoking history, followed for chronic asthmatic  bronchitis/bronchiectasis, history of sinusitis, allergic rhinitis, sleep apnea, AFib/ CAD/ MI, GERD He admits his anxiety is getting to him. 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 Apparently he was put to sleep with intention to remove the lesion but they found they could not. He is left persistently hoarse.  He had just had back surgery 8 weeks previously and has been taking oxycodone for back pain. Lorazepam helps him sleep at night.  CPAP continues all night every night with good control. 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  11/03/11- 77 year old male with remote smoking history, followed for chronic asthmatic bronchitis/bronchiectasis, history of sinusitis, allergic rhinitis, sleep apnea, AFib/ CAD/ MI, GERD He wants to wait until November for flu shot. Continues allergy vaccine 1:10 GH. He has not had much respiratory problem until the last one or 2 weeks when he began throat clearing. Has used his rescue inhaler only once or twice. In February he had spine surgery for spinal stenosis. Oxycodone caused panic attack lasting 3 months. Then had a vocal cord biopsy at [facility]. Exacerbation of atrial fibrillation treated with pacemaker in June, then amiodarone. He still feels more short of breath than normal but is starting cardiac rehabilitation. CPAP/ [facility].  12/26/11-  77 year old male with remote smoking history, followed for chronic asthmatic bronchitis/bronchiectasis, history of sinusitis, allergic rhinitis, sleep apnea, AFib/ CAD/ MI, GERD     Wife here FOLLOWS FOR: on last dose of Amoxicillin(dx'd with PNA at hospital 12-16-11; feels somewhat better but not 100%.  Still on allergy vaccine. Acute  illness is now improving. Antibiotic ends today. No fever, minimal phlegm, throat tickle, shortness of breath with sustained speech. Denies cough or wheeze. CXR  12/18/11- reviewed IMPRESSION:  Improved right lower lobe airspace disease. Small right effusion  noted.  Original Report Authenticated By: Holley Dexter, M.D.    03/29/2012 Acute OV  Complains of sneezing, runny nose with clear drainage, watery eyes, increased saliva x12months .  Has multiple complaints since surgery last year for pacemaker and back surgery.  Has significant drippy nose and post nasal drainage. Mainly clear drainage. Has to clear throat.  Has dry cough on/off  Does gets choked on saliva and thin liquids.  No significant dysphagia at present. Marland Kitchen Has had previous esophageal dilatation.  Has excessive saliva intermittently.  Gets weekly allergy shots.  Started on Dymista -did not use but once because had a headache.  No discolored mucus, fever, chest pain or edema.       ROS-see HPi Constitutional:   No  weight loss, night sweats,  Fevers, chills, fatigue, or  lassitude.  HEENT:   No headaches,  Difficulty swallowing,  Tooth/dental problems, or  Sore throat,                No sneezing, itching, ear ache,  ++nasal congestion, post nasal drip,   CV:  No chest pain,  Orthopnea, PND, swelling in lower extremities, anasarca, dizziness, palpitations, syncope.   GI  No heartburn, indigestion, abdominal pain, nausea, vomiting, diarrhea, change in bowel habits, loss of appetite, bloody stools.   Resp:   No coughing up of blood.  No change in color of mucus.  No wheezing.  No chest wall deformity  Skin: no rash or lesions.  GU: no dysuria, change in color of urine, no urgency or frequency.  No flank pain, no hematuria   MS:  No joint pain or swelling.  No decreased range of motion.  No back pain.  Psych:  No change in mood or affect. No depression or anxiety.  No memory loss.      OBJ General- Alert, Oriented, Affect-sad, Distress- always somewhat anxious Skin- rash-none, lesions- none, excoriation- none Lymphadenopathy- none Head- atraumatic            Eyes-  Gross vision intact, PERRLA, conjunctivae clear secretions            Ears- Hearing, canals-normal            Nose- Clear, ?Septal dev -narrower on the right, no-  mucus, polyps, erosion, perforation             Throat- Mallampati II/ s/p UPPP , mucosa clear , drainage- none, tonsils- atrophic. Very hoarse Neck- flexible , trachea midline, no stridor , thyroid nl, carotid no bruit Chest - symmetrical excursion , unlabored           Heart/CV- RRR , no murmur , no gallop  , no rub, nl s1 s2                           - JVD- none , edema- none, stasis changes- none, varices- none           Lung- diminshed BS in bases, upper airway psuedowheeze on forced exp.            Chest wall- pacemaker Abd-  Br/ Gen/ Rectal- Not done, not indicated Extrem- cyanosis- none, clubbing, none, atrophy- none, strength- nl Neuro- +tremor, -chronic

## 2012-03-30 ENCOUNTER — Encounter (HOSPITAL_COMMUNITY): Payer: Medicare Other

## 2012-04-02 ENCOUNTER — Encounter (HOSPITAL_COMMUNITY)
Admission: RE | Admit: 2012-04-02 | Discharge: 2012-04-02 | Disposition: A | Discharge status: Home / Self Care | Payer: Medicare Other | Source: Ambulatory Visit | Attending: Cardiovascular Disease | Admitting: Cardiovascular Disease

## 2012-04-02 NOTE — Assessment & Plan Note (Addendum)
Flare   Plan  Claritin 10mg  daily As needed  Drainage  Saline nasal rinses As needed     follow up Dr. Maple Hudson  As planned and As needed   Please contact office for sooner follow up if symptoms do not improve or worsen or seek emergency care

## 2012-04-02 NOTE — Assessment & Plan Note (Signed)
Mild flare with PND and ? ACE cough   Plan  Claritin 10mg  daily As needed  Drainage  Saline nasal rinses As needed   Discuss with cardiologist regarding ACE - Ramipril causing throat tickle/cough  Would avoid ACE inibitors in future if possible  follow up Dr. Maple Hudson  As planned and As needed   Please contact office for sooner follow up if symptoms do not improve or worsen or seek emergency care

## 2012-04-04 ENCOUNTER — Encounter (HOSPITAL_COMMUNITY)
Admission: RE | Admit: 2012-04-04 | Discharge: 2012-04-04 | Disposition: A | Discharge status: Home / Self Care | Payer: Medicare Other | Source: Ambulatory Visit | Attending: Cardiovascular Disease | Admitting: Cardiovascular Disease

## 2012-04-06 ENCOUNTER — Encounter (HOSPITAL_COMMUNITY): Payer: Medicare Other

## 2012-04-09 ENCOUNTER — Encounter (HOSPITAL_COMMUNITY)
Admission: RE | Admit: 2012-04-09 | Discharge: 2012-04-09 | Disposition: A | Discharge status: Home / Self Care | Payer: Medicare Other | Source: Ambulatory Visit | Attending: Cardiovascular Disease | Admitting: Cardiovascular Disease

## 2012-04-11 ENCOUNTER — Encounter (HOSPITAL_COMMUNITY)
Admission: RE | Admit: 2012-04-11 | Discharge: 2012-04-11 | Disposition: A | Discharge status: Home / Self Care | Payer: Medicare Other | Source: Ambulatory Visit | Attending: Cardiovascular Disease | Admitting: Cardiovascular Disease

## 2012-04-12 ENCOUNTER — Ambulatory Visit (INDEPENDENT_AMBULATORY_CARE_PROVIDER_SITE_OTHER): Payer: Medicare Other

## 2012-04-12 ENCOUNTER — Other Ambulatory Visit (HOSPITAL_COMMUNITY): Payer: Self-pay | Admitting: Cardiovascular Disease

## 2012-04-12 DIAGNOSIS — J309 Allergic rhinitis, unspecified: Secondary | ICD-10-CM | POA: Diagnosis not present

## 2012-04-12 DIAGNOSIS — R0602 Shortness of breath: Secondary | ICD-10-CM | POA: Diagnosis not present

## 2012-04-12 DIAGNOSIS — I255 Ischemic cardiomyopathy: Secondary | ICD-10-CM

## 2012-04-12 DIAGNOSIS — I447 Left bundle-branch block, unspecified: Secondary | ICD-10-CM

## 2012-04-12 DIAGNOSIS — I4891 Unspecified atrial fibrillation: Secondary | ICD-10-CM | POA: Diagnosis not present

## 2012-04-12 DIAGNOSIS — E782 Mixed hyperlipidemia: Secondary | ICD-10-CM | POA: Diagnosis not present

## 2012-04-12 DIAGNOSIS — Z95810 Presence of automatic (implantable) cardiac defibrillator: Secondary | ICD-10-CM

## 2012-04-12 DIAGNOSIS — I495 Sick sinus syndrome: Secondary | ICD-10-CM | POA: Diagnosis not present

## 2012-04-12 DIAGNOSIS — I509 Heart failure, unspecified: Secondary | ICD-10-CM | POA: Diagnosis not present

## 2012-04-12 DIAGNOSIS — R5381 Other malaise: Secondary | ICD-10-CM | POA: Diagnosis not present

## 2012-04-12 DIAGNOSIS — Z79899 Other long term (current) drug therapy: Secondary | ICD-10-CM | POA: Diagnosis not present

## 2012-04-12 DIAGNOSIS — Z4502 Encounter for adjustment and management of automatic implantable cardiac defibrillator: Secondary | ICD-10-CM | POA: Diagnosis not present

## 2012-04-13 ENCOUNTER — Encounter (HOSPITAL_COMMUNITY): Payer: Medicare Other

## 2012-04-16 ENCOUNTER — Ambulatory Visit (HOSPITAL_COMMUNITY): Payer: Medicare Other

## 2012-04-16 ENCOUNTER — Encounter (HOSPITAL_COMMUNITY)
Admission: RE | Admit: 2012-04-16 | Discharge: 2012-04-16 | Disposition: A | Discharge status: Home / Self Care | Payer: Medicare Other | Source: Ambulatory Visit | Attending: Cardiovascular Disease | Admitting: Cardiovascular Disease

## 2012-04-18 ENCOUNTER — Encounter (HOSPITAL_COMMUNITY)
Admission: RE | Admit: 2012-04-18 | Discharge: 2012-04-18 | Disposition: A | Discharge status: Home / Self Care | Payer: Medicare Other | Source: Ambulatory Visit | Attending: Cardiovascular Disease | Admitting: Cardiovascular Disease

## 2012-04-19 ENCOUNTER — Ambulatory Visit (INDEPENDENT_AMBULATORY_CARE_PROVIDER_SITE_OTHER): Payer: Medicare Other

## 2012-04-19 DIAGNOSIS — J309 Allergic rhinitis, unspecified: Secondary | ICD-10-CM | POA: Diagnosis not present

## 2012-04-20 ENCOUNTER — Encounter (HOSPITAL_COMMUNITY): Payer: Medicare Other

## 2012-04-20 ENCOUNTER — Encounter: Payer: Medicare Other | Admitting: Internal Medicine

## 2012-04-23 ENCOUNTER — Encounter (HOSPITAL_COMMUNITY): Payer: Medicare Other

## 2012-04-25 ENCOUNTER — Encounter (HOSPITAL_COMMUNITY): Payer: Medicare Other

## 2012-04-25 DIAGNOSIS — E785 Hyperlipidemia, unspecified: Secondary | ICD-10-CM | POA: Insufficient documentation

## 2012-04-25 DIAGNOSIS — I5022 Chronic systolic (congestive) heart failure: Secondary | ICD-10-CM | POA: Insufficient documentation

## 2012-04-25 DIAGNOSIS — Z950 Presence of cardiac pacemaker: Secondary | ICD-10-CM | POA: Insufficient documentation

## 2012-04-25 DIAGNOSIS — J45909 Unspecified asthma, uncomplicated: Secondary | ICD-10-CM | POA: Insufficient documentation

## 2012-04-25 DIAGNOSIS — I495 Sick sinus syndrome: Secondary | ICD-10-CM | POA: Insufficient documentation

## 2012-04-25 DIAGNOSIS — I2510 Atherosclerotic heart disease of native coronary artery without angina pectoris: Secondary | ICD-10-CM | POA: Insufficient documentation

## 2012-04-25 DIAGNOSIS — K219 Gastro-esophageal reflux disease without esophagitis: Secondary | ICD-10-CM | POA: Insufficient documentation

## 2012-04-25 DIAGNOSIS — Z5189 Encounter for other specified aftercare: Secondary | ICD-10-CM | POA: Insufficient documentation

## 2012-04-25 DIAGNOSIS — I4891 Unspecified atrial fibrillation: Secondary | ICD-10-CM | POA: Insufficient documentation

## 2012-04-25 DIAGNOSIS — I10 Essential (primary) hypertension: Secondary | ICD-10-CM | POA: Insufficient documentation

## 2012-04-25 DIAGNOSIS — Z8673 Personal history of transient ischemic attack (TIA), and cerebral infarction without residual deficits: Secondary | ICD-10-CM | POA: Insufficient documentation

## 2012-04-25 DIAGNOSIS — Z8546 Personal history of malignant neoplasm of prostate: Secondary | ICD-10-CM | POA: Insufficient documentation

## 2012-04-25 DIAGNOSIS — I252 Old myocardial infarction: Secondary | ICD-10-CM | POA: Insufficient documentation

## 2012-04-25 DIAGNOSIS — F1010 Alcohol abuse, uncomplicated: Secondary | ICD-10-CM | POA: Insufficient documentation

## 2012-04-26 ENCOUNTER — Ambulatory Visit: Payer: Medicare Other

## 2012-04-27 ENCOUNTER — Encounter (HOSPITAL_COMMUNITY): Payer: Medicare Other

## 2012-04-30 ENCOUNTER — Ambulatory Visit (INDEPENDENT_AMBULATORY_CARE_PROVIDER_SITE_OTHER): Payer: Medicare Other | Admitting: Adult Health

## 2012-04-30 ENCOUNTER — Encounter (HOSPITAL_COMMUNITY): Payer: Medicare Other

## 2012-04-30 ENCOUNTER — Telehealth: Payer: Self-pay | Admitting: Internal Medicine

## 2012-04-30 ENCOUNTER — Ambulatory Visit (INDEPENDENT_AMBULATORY_CARE_PROVIDER_SITE_OTHER)
Admission: RE | Admit: 2012-04-30 | Discharge: 2012-04-30 | Disposition: A | Discharge status: Home / Self Care | Payer: Medicare Other | Source: Ambulatory Visit | Attending: Adult Health | Admitting: Adult Health

## 2012-04-30 ENCOUNTER — Encounter: Payer: Self-pay | Admitting: Adult Health

## 2012-04-30 VITALS — BP 122/76 | HR 72 | Temp 98.2°F | Ht 72.0 in | Wt 186.6 lb

## 2012-04-30 DIAGNOSIS — J45909 Unspecified asthma, uncomplicated: Secondary | ICD-10-CM

## 2012-04-30 DIAGNOSIS — R05 Cough: Secondary | ICD-10-CM | POA: Diagnosis not present

## 2012-04-30 DIAGNOSIS — R0602 Shortness of breath: Secondary | ICD-10-CM | POA: Diagnosis not present

## 2012-04-30 MED ORDER — LEVALBUTEROL HCL 0.63 MG/3ML IN NEBU
0.6300 mg | INHALATION_SOLUTION | Freq: Once | RESPIRATORY_TRACT | Status: AC
  Administered 2012-04-30: 0.63 mg via RESPIRATORY_TRACT

## 2012-04-30 MED ORDER — AMOXICILLIN-POT CLAVULANATE 875-125 MG PO TABS: 1.0000 | ORAL_TABLET | Freq: Two times a day (BID) | ORAL | Status: AC

## 2012-04-30 MED ORDER — PREDNISONE 10 MG PO TABS: ORAL_TABLET | ORAL | Status: DC

## 2012-04-30 NOTE — Progress Notes (Signed)
11/01/10-77 year old male with remote smoking history, followed for chronic asthmatic bronchitis/bronchiectasis, history of sinusitis, allergic rhinitis, sleep apnea Last here 03/15/10 He wants to wait until November for flu shot. He has not had recent or acute symptoms related to his breathing and has not used Advair in 6 months. He didn't find it helpful. Rare use of Proair, maybe once every 3-4 months. He continues allergy vaccine, missing a dose sometimes if he is away from town. He notices no problems and offers no questions. He continues to use CPAP all night every night. He had had a palatoplasty he doesn't remember when and wonders if this was done at the time of his tonsillectomy as a young child. He notices persistent narrowness through the right nostril related to his septal deviation. Nasonex helps. We discussed a trial of Breathe Right strips to be used under his CPAP mask. He can feel when his atrial fibrillation is more active, as in the past 2 days. He treats his difficulty initiating and maintaining sleep with a one half of a 5 mg Ambien tablet when needed.   11/01/10-77 year old male with remote smoking history, followed for chronic asthmatic bronchitis/bronchiectasis, history of sinusitis, allergic rhinitis, sleep apnea Walked in today as an acute unscheduled visit complaining of cough since lunchtime. He got a throat tickle walking to his car and got gradually worse. Persistent for 4 hours with chest congestion. Chronic feeling of raspy throat clearing. Orthopedic knee surgery one month ago/Dr. Tonita Cong. Oxycodone given in hospital cause shaking, insomnia, panic attack. When he got home he took some leftover hydrocodone and had the same experience, up all night, pacing and anxious. This is set off his chronic anxiety. Dr Tonita Cong called in lorazepam- helps. Continues CPAP all night every night.  05/16/11- 77 year old male with remote smoking history, followed for chronic asthmatic  bronchitis/bronchiectasis, history of sinusitis, allergic rhinitis, sleep apnea, AFib/ CAD/ MI, GERD He admits his anxiety is getting to him. 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 Apparently he was put to sleep with intention to remove the lesion but they found they could not. He is left persistently hoarse.  He had just had back surgery 8 weeks previously and has been taking oxycodone for back pain. Lorazepam helps him sleep at night.  CPAP continues all night every night with good control. 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  11/03/11- 77 year old male with remote smoking history, followed for chronic asthmatic bronchitis/bronchiectasis, history of sinusitis, allergic rhinitis, sleep apnea, AFib/ CAD/ MI, GERD He wants to wait until November for flu shot. Continues allergy vaccine 1:10 GH. He has not had much respiratory problem until the last one or 2 weeks when he began throat clearing. Has used his rescue inhaler only once or twice. In February he had spine surgery for spinal stenosis. Oxycodone caused panic attack lasting 3 months. Then had a vocal cord biopsy at [facility]. Exacerbation of atrial fibrillation treated with pacemaker in June, then amiodarone. He still feels more short of breath than normal but is starting cardiac rehabilitation. CPAP/ [facility].  12/26/11-  77 year old male with remote smoking history, followed for chronic asthmatic bronchitis/bronchiectasis, history of sinusitis, allergic rhinitis, sleep apnea, AFib/ CAD/ MI, GERD     Wife here FOLLOWS FOR: on last dose of Amoxicillin(dx'd with PNA at hospital 12-16-11; feels somewhat better but not 100%.  Still on allergy vaccine. Acute  illness is now improving. Antibiotic ends today. No fever, minimal phlegm, throat tickle, shortness of breath with sustained speech. Denies cough or wheeze. CXR  12/18/11- reviewed IMPRESSION:  Improved right lower lobe airspace disease. Small right effusion  noted.  Original Report Authenticated By: Holley Dexter, M.D.    03/29/2012 Acute OV  Complains of sneezing, runny nose with clear drainage, watery eyes, increased saliva x 6 months .  Has multiple complaints since surgery last year for pacemaker and back surgery.  Has significant drippy nose and post nasal drainage. Mainly clear drainage. Has to clear throat.  Has dry cough on/off  Does gets choked on saliva and thin liquids.  No significant dysphagia at present. Marland Kitchen Has had previous esophageal dilatation.  Has excessive saliva intermittently.  Gets weekly allergy shots.  Started on Dymista -did not use but once because had a headache.  No discolored mucus, fever, chest pain or edema.  >>rec to stop ACE , rx claritin   04/30/2012 Acute OV  Complains of prod cough with yellow mucus, increased SOB x 8 days.  Recently on vacation on cruise.  Patient reports that he started coughing. Prior to beginning his vacation. And cough is progressively gotten worse. He's been taking over-the-counter cold products, and Tylenol without much relief. He denies any hemoptysis, orthopnea, chest pain, palpitations, syncope Patient was recommended to have his ACE inhibitor  Change at last visit. However, patient reports that his doctor does not feel like this is causing his cough and his dose was increased.  ROS-see HPi Constitutional:   No  weight loss, night sweats,  Fevers, chills, fatigue, or  lassitude.  HEENT:   No headaches,  Difficulty swallowing,  Tooth/dental problems, or  Sore throat,                No sneezing, itching, ear ache,  ++nasal congestion, post nasal drip,   CV:  No chest pain,  Orthopnea, PND, swelling in lower extremities, anasarca, dizziness, palpitations, syncope.   GI  No heartburn, indigestion, abdominal pain, nausea, vomiting, diarrhea, change in bowel habits, loss of appetite,  bloody stools.   Resp:     No chest wall deformity  Skin: no rash or lesions.  GU: no dysuria, change in color of urine, no urgency or frequency.  No flank pain, no hematuria   MS:  No joint pain or swelling.  No decreased range of motion.  No back pain.  Psych:  No change in mood or affect. No depression or anxiety.  No memory loss.      OBJ GEN: A/Ox3; pleasant , NAD, well nourished   HEENT:  Singac/AT,  EACs-clear, TMs-wnl, NOSE-clear, THROAT-clear, no lesions, no postnasal drip or exudate noted.   NECK:  Supple w/ fair ROM; no JVD; normal carotid impulses w/o bruits; no thyromegaly or nodules palpated; no lymphadenopathy.  RESP  Diminished BS in bases ,  w/o, wheezes/ rales/ or rhonchi.no accessory muscle use, no dullness to percussion  CARD:  RRR, no m/r/g  , no peripheral edema, pulses intact, no cyanosis or clubbing.  GI:   Soft & nt; nml bowel sounds; no organomegaly or masses detected.  Musco: Warm bil, no deformities or joint swelling noted.   Neuro: alert, no focal deficits noted.    Skin: Warm, no lesions or rashes

## 2012-04-30 NOTE — Addendum Note (Signed)
Addended by: Boone Master E on: 04/30/2012 12:37 PM   Modules accepted: Orders

## 2012-04-30 NOTE — Patient Instructions (Addendum)
Augmentin 875 mg twice daily, take with food. Mucinex DM twice daily as needed. For cough and congestion. Prednisone taper in the next week. Fluids and rest. I will call a chest x-ray results. Follow Dr. Maple Hudson in one month and as needed. Please contact office for sooner follow up if symptoms do not improve or worsen or seek emergency care

## 2012-04-30 NOTE — Telephone Encounter (Signed)
Patient states he has been sick x9 days when he was out of the country (returned this morning). Felt like he had a cold coming on, hoarseness, prod cough w/  thick yellow mucus, some chest tightness and sob when he was exerting himself around [address] Felt like he was running a fever at one point in time but is now gone Took tylenol along with his regular maintenance medications- only--while he was on cruise ship. Patient requesting to come in and be seen today. CY has no openings, pt had been scheduled to see TP today at 945am. Patient aware of this appt, and nothing further needed at this time.

## 2012-04-30 NOTE — Assessment & Plan Note (Signed)
Recurrent flares with ongoing cough   Check xray  xopenex neb x1   Plan Augmentin 875 mg twice daily, take with food. Mucinex DM twice daily as needed. For cough and congestion. Prednisone taper in the next week. Fluids and rest. I will call a chest x-ray results. Follow Dr. Maple Hudson in one month and as needed. Please contact office for sooner follow up if symptoms do not improve or worsen or seek emergency care   ACE inhibitor may be contributing to frequent flares, if not improving  Consider change - will leave to Dr. Maple Hudson  On return.

## 2012-05-01 ENCOUNTER — Telehealth: Payer: Self-pay | Admitting: Internal Medicine

## 2012-05-01 NOTE — Telephone Encounter (Signed)
Patient states he needs to called on his cell phone.

## 2012-05-01 NOTE — Telephone Encounter (Signed)
Spoke with pt's wife and given cxr results per dpr on file

## 2012-05-02 ENCOUNTER — Encounter (HOSPITAL_COMMUNITY)
Admission: RE | Admit: 2012-05-02 | Discharge: 2012-05-02 | Disposition: A | Discharge status: Home / Self Care | Payer: Medicare Other | Source: Ambulatory Visit | Attending: Cardiovascular Disease | Admitting: Cardiovascular Disease

## 2012-05-03 ENCOUNTER — Ambulatory Visit (INDEPENDENT_AMBULATORY_CARE_PROVIDER_SITE_OTHER): Payer: Medicare Other

## 2012-05-03 DIAGNOSIS — J309 Allergic rhinitis, unspecified: Secondary | ICD-10-CM | POA: Diagnosis not present

## 2012-05-04 ENCOUNTER — Ambulatory Visit (INDEPENDENT_AMBULATORY_CARE_PROVIDER_SITE_OTHER): Payer: Medicare Other

## 2012-05-04 ENCOUNTER — Encounter (HOSPITAL_COMMUNITY): Payer: Medicare Other

## 2012-05-04 DIAGNOSIS — J309 Allergic rhinitis, unspecified: Secondary | ICD-10-CM | POA: Diagnosis not present

## 2012-05-07 ENCOUNTER — Encounter (HOSPITAL_COMMUNITY): Payer: Medicare Other

## 2012-05-09 ENCOUNTER — Encounter (HOSPITAL_COMMUNITY)
Admission: RE | Admit: 2012-05-09 | Discharge: 2012-05-09 | Disposition: A | Discharge status: Home / Self Care | Payer: Medicare Other | Source: Ambulatory Visit | Attending: Cardiovascular Disease | Admitting: Cardiovascular Disease

## 2012-05-10 ENCOUNTER — Ambulatory Visit: Payer: Medicare Other

## 2012-05-11 ENCOUNTER — Encounter (HOSPITAL_COMMUNITY): Payer: Medicare Other

## 2012-05-12 ENCOUNTER — Encounter: Payer: Self-pay | Admitting: Pharmacist Clinician (PhC)/ Clinical Pharmacy Specialist

## 2012-05-12 DIAGNOSIS — Z7901 Long term (current) use of anticoagulants: Secondary | ICD-10-CM | POA: Insufficient documentation

## 2012-05-12 DIAGNOSIS — I4891 Unspecified atrial fibrillation: Secondary | ICD-10-CM

## 2012-05-14 ENCOUNTER — Encounter (HOSPITAL_COMMUNITY)
Admission: RE | Admit: 2012-05-14 | Discharge: 2012-05-14 | Disposition: A | Discharge status: Home / Self Care | Payer: Medicare Other | Source: Ambulatory Visit | Attending: Cardiovascular Disease | Admitting: Cardiovascular Disease

## 2012-05-15 ENCOUNTER — Ambulatory Visit (INDEPENDENT_AMBULATORY_CARE_PROVIDER_SITE_OTHER): Payer: Medicare Other | Admitting: Internal Medicine

## 2012-05-15 ENCOUNTER — Encounter: Payer: Self-pay | Admitting: Internal Medicine

## 2012-05-15 VITALS — BP 130/82 | Temp 97.7°F | Wt 188.0 lb

## 2012-05-15 DIAGNOSIS — M549 Dorsalgia, unspecified: Secondary | ICD-10-CM | POA: Diagnosis not present

## 2012-05-15 DIAGNOSIS — G8929 Other chronic pain: Secondary | ICD-10-CM | POA: Diagnosis not present

## 2012-05-15 NOTE — Progress Notes (Signed)
Patient ID: Corey [name], male   DOB: 1935/07/30, 77 y.o.   MRN: [id number] Right sided back pain for years. Yesterday pain was bad enough that it interfered with driving. He also noted that taking a deep breat casued low back pain (right sided). No radiation of pain Reviewed mri and ls spine films  Reviewed pmh, psh, sochx   patient denies chest pain, shortness of breath, orthopnea. Denies lower extremity edema, abdominal pain, change in appetite, change in bowel movements. Patient denies rashes, musculoskeletal complaints. No other specific complaints in a complete review of systems.    well-developed well-nourished male in no acute distress. HEENT exam atraumatic, normocephalic, neck supple without jugular venous distention. Chest clear to auscultation cardiac exam S1-S2 are regular. Abdominal exam overweight with bowel sounds, soft and nontender. Extremities no edema. Neurologic exam is alert with a normal gait. FROM both hips. Negative SLR dtrs at knees are normal and symmetric  A/p Chronic back pain-- refer to PT

## 2012-05-16 ENCOUNTER — Encounter: Payer: Self-pay | Admitting: Internal Medicine

## 2012-05-16 ENCOUNTER — Ambulatory Visit (INDEPENDENT_AMBULATORY_CARE_PROVIDER_SITE_OTHER): Payer: Medicare Other

## 2012-05-16 ENCOUNTER — Other Ambulatory Visit: Payer: Medicare Other

## 2012-05-16 ENCOUNTER — Encounter (HOSPITAL_COMMUNITY)
Admission: RE | Admit: 2012-05-16 | Discharge: 2012-05-16 | Disposition: A | Discharge status: Home / Self Care | Payer: Medicare Other | Source: Ambulatory Visit | Attending: Cardiovascular Disease | Admitting: Cardiovascular Disease

## 2012-05-16 ENCOUNTER — Ambulatory Visit (INDEPENDENT_AMBULATORY_CARE_PROVIDER_SITE_OTHER): Payer: Medicare Other | Admitting: Internal Medicine

## 2012-05-16 VITALS — BP 124/80 | HR 86 | Ht 72.0 in | Wt 190.6 lb

## 2012-05-16 DIAGNOSIS — J8410 Pulmonary fibrosis, unspecified: Secondary | ICD-10-CM

## 2012-05-16 DIAGNOSIS — G4733 Obstructive sleep apnea (adult) (pediatric): Secondary | ICD-10-CM

## 2012-05-16 DIAGNOSIS — J984 Other disorders of lung: Secondary | ICD-10-CM

## 2012-05-16 DIAGNOSIS — J209 Acute bronchitis, unspecified: Secondary | ICD-10-CM

## 2012-05-16 DIAGNOSIS — M48061 Spinal stenosis, lumbar region without neurogenic claudication: Secondary | ICD-10-CM

## 2012-05-16 DIAGNOSIS — J45901 Unspecified asthma with (acute) exacerbation: Secondary | ICD-10-CM

## 2012-05-16 DIAGNOSIS — J309 Allergic rhinitis, unspecified: Secondary | ICD-10-CM | POA: Diagnosis not present

## 2012-05-16 NOTE — Patient Instructions (Addendum)
TB Quant gold assay    Dx granulomatous lung disease  Keep planned appointment. Call sooner as needed

## 2012-05-16 NOTE — Progress Notes (Signed)
11/01/10-77 year old male with remote smoking history, followed for chronic asthmatic bronchitis/bronchiectasis, history of sinusitis, allergic rhinitis, sleep apnea Last here 03/15/10 He wants to wait until November for flu shot. He has not had recent or acute symptoms related to his breathing and has not used Advair in 6 months. He didn't find it helpful. Rare use of Proair, maybe once every 3-4 months. He continues allergy vaccine, missing a dose sometimes if he is away from town. He notices no problems and offers no questions. He continues to use CPAP all night every night. He had had a palatoplasty he doesn't remember when and wonders if this was done at the time of his tonsillectomy as a young child. He notices persistent narrowness through the right nostril related to his septal deviation. Nasonex helps. We discussed a trial of Breathe Right strips to be used under his CPAP mask. He can feel when his atrial fibrillation is more active, as in the past 2 days. He treats his difficulty initiating and maintaining sleep with a one half of a 5 mg Ambien tablet when needed.   11/01/10-77 year old male with remote smoking history, followed for chronic asthmatic bronchitis/bronchiectasis, history of sinusitis, allergic rhinitis, sleep apnea Walked in today as an acute unscheduled visit complaining of cough since lunchtime. He got a throat tickle walking to his car and got gradually worse. Persistent for 4 hours with chest congestion. Chronic feeling of raspy throat clearing. Orthopedic knee surgery one month ago/Dr. Tonita Cong. Oxycodone given in hospital cause shaking, insomnia, panic attack. When he got home he took some leftover hydrocodone and had the same experience, up all night, pacing and anxious. This is set off his chronic anxiety. Dr Tonita Cong called in lorazepam- helps. Continues CPAP all night every night.  05/16/11- 77 year old male with remote smoking history, followed for chronic asthmatic  bronchitis/bronchiectasis, history of sinusitis, allergic rhinitis, sleep apnea, AFib/ CAD/ MI, GERD He admits his anxiety is getting to him. 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 Apparently he was put to sleep with intention to remove the lesion but they found they could not. He is left persistently hoarse.  He had just had back surgery 8 weeks previously and has been taking oxycodone for back pain. Lorazepam helps him sleep at night.  CPAP continues all night every night with good control. 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  11/03/11- 77 year old male with remote smoking history, followed for chronic asthmatic bronchitis/bronchiectasis, history of sinusitis, allergic rhinitis, sleep apnea, AFib/ CAD/ MI, GERD He wants to wait until November for flu shot. Continues allergy vaccine 1:10 GH. He has not had much respiratory problem until the last one or 2 weeks when he began throat clearing. Has used his rescue inhaler only once or twice. In February he had spine surgery for spinal stenosis. Oxycodone caused panic attack lasting 3 months. Then had a vocal cord biopsy at [facility]. Exacerbation of atrial fibrillation treated with pacemaker in June, then amiodarone. He still feels more short of breath than normal but is starting cardiac rehabilitation. CPAP/ [facility].  12/26/11-  77 year old male with remote smoking history, followed for chronic asthmatic bronchitis/bronchiectasis, history of sinusitis, allergic rhinitis, sleep apnea, AFib/ CAD/ MI, GERD     Wife here FOLLOWS FOR: on last dose of Amoxicillin(dx'd with PNA at hospital 12-16-11; feels somewhat better but not 100%.  Still on allergy vaccine. Acute  illness is now improving. Antibiotic ends today. No fever, minimal phlegm, throat tickle, shortness of breath with sustained speech. Denies cough or wheeze. CXR  12/18/11- reviewed IMPRESSION:  Improved right lower lobe airspace disease. Small right effusion  noted.  Original Report Authenticated By: Holley Dexter, M.D.    03/29/2012 Acute OV  Complains of sneezing, runny nose with clear drainage, watery eyes, increased saliva x 6 months .  Has multiple complaints since surgery last year for pacemaker and back surgery.  Has significant drippy nose and post nasal drainage. Mainly clear drainage. Has to clear throat.  Has dry cough on/off  Does gets choked on saliva and thin liquids.  No significant dysphagia at present. Marland Kitchen Has had previous esophageal dilatation.  Has excessive saliva intermittently.  Gets weekly allergy shots.  Started on Dymista -did not use but once because had a headache.  No discolored mucus, fever, chest pain or edema.  >>rec to stop ACE , rx claritin   04/30/2012 Acute OV  Complains of prod cough with yellow mucus, increased SOB x 8 days.  Recently on vacation on cruise.  Patient reports that he started coughing. Prior to beginning his vacation. And cough is progressively gotten worse. He's been taking over-the-counter cold products, and Tylenol without much relief. He denies any hemoptysis, orthopnea, chest pain, palpitations, syncope Patient was recommended to have his ACE inhibitor  Change at last visit. However, patient reports that his doctor does not feel like this is causing his cough and his dose was increased.  05/16/12- 77 year old male with remote smoking history, followed for chronic asthmatic bronchitis/bronchiectasis, history of sinusitis, allergic rhinitis, sleep apnea, AFib/ CAD/ MI, GERD   FOLLOWS FOR: still on vaccine and doing well; no major flare ups at this time. Describes pain in the left mid back intermittent x10 years. His primary physician has thought it was arthritis and referred for physical therapy which starts tomorrow. History of spinal stenosis surgery. He had an acute bronchitis at last office  visit, responding to Augmentin, prednisone and Mucinex DM. He continues allergy vaccine 1:10 GH. CXR 05/01/12 *RADIOLOGY REPORT*  Clinical Data: Cough and shortness of breath.  CHEST - 2 VIEW  Comparison: 01/17/2012  Findings: Stable appearance of the left cardiac ICD leads. The  lungs are clear without airspace disease or edema. Stable  appearance of the heart and mediastinum. Stable densities or  calcifications in the left mid lung region.  IMPRESSION:  No active cardiopulmonary disease.  Original Report Authenticated By: Richarda Overlie, M.D.  ROS-see HPI Constitutional:   No-   weight loss, night sweats, fevers, chills, fatigue, lassitude. HEENT:   No-  headaches, difficulty swallowing, tooth/dental problems, sore throat,       No-  sneezing, itching, ear ache, nasal congestion, post nasal drip,  CV:  No-   chest pain, orthopnea, PND, swelling in lower extremities, anasarca,                                  dizziness, palpitations Resp: No-   shortness of breath with exertion or at rest.              No-   productive cough,  No non-productive cough,  No- coughing up of blood.              No-   change in color of mucus.  No- wheezing.   Skin: No-   rash or lesions. GI:  No-   heartburn, indigestion, abdominal pain, nausea, vomiting, GU:  MS:  No-   joint pain or swelling.  .  + back pain. Neuro-     nothing unusual Psych:  No- change in mood or affect. No depression or anxiety.  No memory loss.  OBJ- Physical Exam General- Alert, Oriented, Affect-appropriate, Distress- none acute Skin- rash-none, lesions- none, excoriation- none Lymphadenopathy- none Head- atraumatic            Eyes- Gross vision intact, PERRLA, conjunctivae and secretions clear            Ears- Hearing, canals-normal            Nose- Clear, no-Septal dev, mucus, polyps, erosion, perforation             Throat- Mallampati II , +frequent throat clearing without visible drainage , drainage- none, tonsils-  atrophic Neck- flexible , trachea midline, no stridor , thyroid nl, carotid no bruit Chest - symmetrical excursion , unlabored           Heart/CV- RRR , no murmur , no gallop  , no rub, nl s1 s2                           - JVD- none , edema- none, stasis changes- none, varices- none           Lung- +basilar crackles, wheeze- none, cough- none , dullness-none, rub- none           Chest wall- L pacemaker Abd- Br/ Gen/ Rectal- Not done, not indicated Extrem- cyanosis- none, clubbing, none, atrophy- none, strength- nl Neuro- grossly intact to observation

## 2012-05-17 ENCOUNTER — Ambulatory Visit: Payer: Medicare Other | Attending: Internal Medicine | Admitting: Physical Therapy

## 2012-05-17 DIAGNOSIS — M546 Pain in thoracic spine: Secondary | ICD-10-CM | POA: Insufficient documentation

## 2012-05-17 DIAGNOSIS — IMO0001 Reserved for inherently not codable concepts without codable children: Secondary | ICD-10-CM | POA: Insufficient documentation

## 2012-05-18 ENCOUNTER — Encounter (HOSPITAL_COMMUNITY): Payer: Medicare Other

## 2012-05-18 LAB — QUANTIFERON TB GOLD ASSAY (BLOOD)
Mitogen value: 3.71 IU/mL
Quantiferon Nil Value: 0.25 IU/mL
Quantiferon Tb Ag Minus Nil Value: 0.06 IU/mL
TB Ag value: 0.31 IU/mL

## 2012-05-19 ENCOUNTER — Other Ambulatory Visit: Payer: Self-pay | Admitting: Internal Medicine

## 2012-05-21 ENCOUNTER — Encounter (HOSPITAL_COMMUNITY)
Admission: RE | Admit: 2012-05-21 | Discharge: 2012-05-21 | Disposition: A | Discharge status: Home / Self Care | Payer: Medicare Other | Source: Ambulatory Visit | Attending: Cardiovascular Disease | Admitting: Cardiovascular Disease

## 2012-05-23 ENCOUNTER — Encounter: Payer: Self-pay | Admitting: Internal Medicine

## 2012-05-23 ENCOUNTER — Telehealth: Payer: Self-pay | Admitting: Internal Medicine

## 2012-05-23 ENCOUNTER — Ambulatory Visit (INDEPENDENT_AMBULATORY_CARE_PROVIDER_SITE_OTHER): Payer: Medicare Other | Admitting: Internal Medicine

## 2012-05-23 ENCOUNTER — Encounter (HOSPITAL_COMMUNITY)
Admission: RE | Admit: 2012-05-23 | Discharge: 2012-05-23 | Disposition: A | Discharge status: Home / Self Care | Payer: Medicare Other | Source: Ambulatory Visit | Attending: Cardiovascular Disease | Admitting: Cardiovascular Disease

## 2012-05-23 VITALS — BP 116/70 | HR 65 | Ht 73.0 in | Wt 190.4 lb

## 2012-05-23 DIAGNOSIS — I4891 Unspecified atrial fibrillation: Secondary | ICD-10-CM | POA: Diagnosis not present

## 2012-05-23 DIAGNOSIS — L819 Disorder of pigmentation, unspecified: Secondary | ICD-10-CM | POA: Diagnosis not present

## 2012-05-23 DIAGNOSIS — I5022 Chronic systolic (congestive) heart failure: Secondary | ICD-10-CM | POA: Diagnosis not present

## 2012-05-23 DIAGNOSIS — L821 Other seborrheic keratosis: Secondary | ICD-10-CM | POA: Diagnosis not present

## 2012-05-23 DIAGNOSIS — L570 Actinic keratosis: Secondary | ICD-10-CM | POA: Diagnosis not present

## 2012-05-23 DIAGNOSIS — L578 Other skin changes due to chronic exposure to nonionizing radiation: Secondary | ICD-10-CM | POA: Diagnosis not present

## 2012-05-23 DIAGNOSIS — Z85828 Personal history of other malignant neoplasm of skin: Secondary | ICD-10-CM | POA: Diagnosis not present

## 2012-05-23 LAB — ICD DEVICE OBSERVATION
AL IMPEDENCE ICD: 540 Ohm
BAMS-0001: 150 {beats}/min
BAMS-0003: 70 {beats}/min
HV IMPEDENCE: 72 Ohm
LV LEAD IMPEDENCE ICD: 980 Ohm
LV LEAD THRESHOLD: 1.25 V
RV LEAD IMPEDENCE ICD: 460 Ohm
TZON-0004SLOWVT: 40
TZON-0005SLOWVT: 6
VENTRICULAR PACING ICD: 99 pct

## 2012-05-23 NOTE — Assessment & Plan Note (Signed)
Old granulomatous disease is suggested imaging. He is not aware of TB contacts but we will check blood tests for documentation. Recent acute bronchitic exacerbation resolved after prednisone. We discussed what to expect from the spring allergy season.

## 2012-05-23 NOTE — Assessment & Plan Note (Signed)
Chronic back pain and is probably associated with arthritis problems in his spine. Coughing will aggravate this.

## 2012-05-23 NOTE — Patient Instructions (Addendum)
Your physician recommends that you schedule a follow-up appointment as needed  

## 2012-05-23 NOTE — Telephone Encounter (Signed)
Notes Recorded by Waymon Budge, MD on 05/18/2012 at 8:01 PM Blood test shows no sign he was ever exposed to TB. Nothing needs to be done. --  I spoke with patient about results and he verbalized understanding and had no questions

## 2012-05-23 NOTE — Assessment & Plan Note (Signed)
He continues CPAP. We discussed association between sleep apnea and atrial fibrillation.

## 2012-05-24 ENCOUNTER — Ambulatory Visit: Payer: Medicare Other | Admitting: Physical Therapy

## 2012-05-24 ENCOUNTER — Ambulatory Visit: Payer: Medicare Other | Attending: Internal Medicine | Admitting: Physical Therapy

## 2012-05-24 ENCOUNTER — Ambulatory Visit (INDEPENDENT_AMBULATORY_CARE_PROVIDER_SITE_OTHER): Payer: Medicare Other

## 2012-05-24 DIAGNOSIS — J309 Allergic rhinitis, unspecified: Secondary | ICD-10-CM

## 2012-05-24 DIAGNOSIS — M546 Pain in thoracic spine: Secondary | ICD-10-CM | POA: Insufficient documentation

## 2012-05-24 DIAGNOSIS — M545 Low back pain: Secondary | ICD-10-CM | POA: Diagnosis not present

## 2012-05-24 DIAGNOSIS — IMO0001 Reserved for inherently not codable concepts without codable children: Secondary | ICD-10-CM | POA: Insufficient documentation

## 2012-05-28 ENCOUNTER — Encounter (HOSPITAL_COMMUNITY)
Admission: RE | Admit: 2012-05-28 | Discharge: 2012-05-28 | Disposition: A | Discharge status: Home / Self Care | Payer: Medicare Other | Source: Ambulatory Visit | Attending: Cardiovascular Disease | Admitting: Cardiovascular Disease

## 2012-05-28 DIAGNOSIS — J45909 Unspecified asthma, uncomplicated: Secondary | ICD-10-CM | POA: Insufficient documentation

## 2012-05-28 DIAGNOSIS — F1010 Alcohol abuse, uncomplicated: Secondary | ICD-10-CM | POA: Insufficient documentation

## 2012-05-28 DIAGNOSIS — I4891 Unspecified atrial fibrillation: Secondary | ICD-10-CM | POA: Insufficient documentation

## 2012-05-28 DIAGNOSIS — Z950 Presence of cardiac pacemaker: Secondary | ICD-10-CM | POA: Insufficient documentation

## 2012-05-28 DIAGNOSIS — I495 Sick sinus syndrome: Secondary | ICD-10-CM | POA: Insufficient documentation

## 2012-05-28 DIAGNOSIS — I10 Essential (primary) hypertension: Secondary | ICD-10-CM | POA: Insufficient documentation

## 2012-05-28 DIAGNOSIS — I2510 Atherosclerotic heart disease of native coronary artery without angina pectoris: Secondary | ICD-10-CM | POA: Insufficient documentation

## 2012-05-28 DIAGNOSIS — I252 Old myocardial infarction: Secondary | ICD-10-CM | POA: Insufficient documentation

## 2012-05-28 DIAGNOSIS — E785 Hyperlipidemia, unspecified: Secondary | ICD-10-CM | POA: Insufficient documentation

## 2012-05-28 DIAGNOSIS — Z8546 Personal history of malignant neoplasm of prostate: Secondary | ICD-10-CM | POA: Insufficient documentation

## 2012-05-28 DIAGNOSIS — Z8673 Personal history of transient ischemic attack (TIA), and cerebral infarction without residual deficits: Secondary | ICD-10-CM | POA: Insufficient documentation

## 2012-05-28 DIAGNOSIS — K219 Gastro-esophageal reflux disease without esophagitis: Secondary | ICD-10-CM | POA: Insufficient documentation

## 2012-05-28 DIAGNOSIS — Z5189 Encounter for other specified aftercare: Secondary | ICD-10-CM | POA: Insufficient documentation

## 2012-05-28 DIAGNOSIS — I5022 Chronic systolic (congestive) heart failure: Secondary | ICD-10-CM | POA: Insufficient documentation

## 2012-05-29 ENCOUNTER — Other Ambulatory Visit: Payer: Self-pay | Admitting: Orthopedic Surgery

## 2012-05-29 ENCOUNTER — Encounter: Payer: Self-pay | Admitting: Internal Medicine

## 2012-05-29 NOTE — Progress Notes (Signed)
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 male who presents today for routine electrophysiology followup.  Since having his ICD upgraded, the patient reports doing very well.  His energy and exercise tolerance have improved.  His primary concern today is with DJD.  Today, he denies symptoms of palpitations, chest pain, shortness of breath,  lower extremity edema, dizziness, presyncope, syncope, or ICD shocks.  The patient is otherwise without complaint today.   Past Medical History  Diagnosis Date  . Alcohol abuse, episodic 05/19/2008  . ALLERGIC RHINITIS 04/15/2009  . COLONIC POLYPS, ADENOMATOUS, HX OF 02/14/2008  . DIVERTICULOSIS, COLON 02/14/2008  . ESOPHAGEAL STRICTURE 01/28/2008  . GERD 02/14/2008  . HYPERGLYCEMIA 11/16/2006  . HYPERLIPIDEMIA 02/14/2008  . INSOMNIA-SLEEP DISORDER-UNSPEC 02/11/2009  . 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 PROSTATE CANCER, HX OF 02/25/2000  . TRANSIENT ISCHEMIC ATTACKS, HX OF 11/16/2006  . Arthritis   . Atrial fibrillation 11/16/2006    remote CHF related to atrial fib with rapid ventricular response over 25 yrs per office note,  . ASTHMA 11/16/2006    sinusitis-    hx ?yeast patch/white patch on vocal cord as per [facility] 02/25/11-   . OBSTRUCTIVE SLEEP APNEA 11/10/2008  . SLEEP APNEA 10/03/2009    LOV Dr Maple Hudson 12/12 in EPIC    Moderate per patient- settings ?6/last sleep study years ago  . HYPERTENSION 11/16/2006  . CORONARY ARTERY DISEASE 11/16/2006    nonischemic cardiomyapathy, last stress test 2011- LOV with clearance note Dr  Alanda Amass and EKG 1/13 on chart  . Symptomatic bradycardia, secondary to sinus node dysfunction 07/08/2011    s/p [facility] Scientific PPM by Dr Royann Shivers, upgraded to BiV ICD 12/13 by Dr Johney Frame [facility])   Past Surgical History  Procedure Laterality Date  . Tonsilectomy, adenoidectomy, bilateral myringotomy and tubes    . Knee arthroscopy      2 surgeries right and 1 left  .  Prostate surgery  2/02    prostate cancer  . Uvulopalatopharyngoplasty    . Cardiac catheterization    . Tonsillectomy    . Esophagogastroduodenoscopy      with dilitation  . Lumbar laminectomy/decompression microdiscectomy  03/02/2011    Procedure: LUMBAR LAMINECTOMY/DECOMPRESSION MICRODISCECTOMY;  Surgeon: Javier Docker, MD;  Location: [facility]S;  Service: Orthopedics;  Laterality: N/A;  Decompression Lumbar 4 - Lumbar(X-Ray)  . Pacemaker insertion  06/2011    [employer] PPM implanted by Dr [employer]  . Cardiac defibrillator placement  01/16/12    Upgrade to a biventricular SJM ICD by DR [place] Welcome    Current Outpatient Prescriptions  Medication Sig Dispense Refill  . acetaminophen (TYLENOL) 500 MG tablet Per bottle as needed for pain      . albuterol (PROVENTIL HFA;VENTOLIN HFA) 108 (90 BASE) MCG/ACT inhaler Inhale 2 puffs into the lungs every 6 (six) hours as needed. For shortness of breath      . amiodarone (PACERONE) 200 MG tablet Take 200 mg by mouth daily.       Marland Kitchen aspirin EC 81 MG tablet Take 81 mg by mouth daily.      . CRESTOR 5 MG tablet Take 1 tablet by mouth daily.      Marland Kitchen LORazepam (ATIVAN) 0.5 MG tablet Take 0.25 mg by mouth at bedtime and may repeat dose one time if needed.       . metoprolol succinate (TOPROL-XL) 50 MG 24 hr tablet Take 50 mg by mouth 2 (two) times  daily. Take with or immediately following a meal.      . mometasone (NASONEX) 50 MCG/ACT nasal spray Place 2 sprays into the nose at bedtime as needed.       . pantoprazole (PROTONIX) 40 MG tablet Take 1 tablet (40 mg total) by mouth daily.  30 tablet  4  . ramipril (ALTACE) 10 MG capsule Take 10 mg by mouth daily.      Marland Kitchen spironolactone (ALDACTONE) 25 MG tablet Take 25 mg by mouth daily.       Marland Kitchen warfarin (COUMADIN) 5 MG tablet Take 2.5-5 mg by mouth daily. Take 5mg  on mondays. Take 2.5mg  the rest of the week      . zolpidem (AMBIEN) 5 MG tablet TAKE ONE TABLET AT BEDTIME AS NEEDED  30 tablet  3   No current  facility-administered medications for this visit.    Physical Exam: Filed Vitals:   05/23/12 1602  BP: 116/70  Pulse: 65  Height: 6\' 1"  (1.854 m)  Weight: 190 lb 6.4 oz (86.365 kg)    GEN- The patient is well appearing, alert and oriented x 3 today.   Head- normocephalic, atraumatic Eyes-  Sclera clear, conjunctiva pink Ears- hearing intact Oropharynx- clear Lungs- Clear to ausculation bilaterally, normal work of breathing Chest- ICD pocket is well healed Heart- Regular rate and rhythm, no murmurs, rubs or gallops, PMI not laterally displaced GI- soft, NT, ND, + BS Extremities- no clubbing, cyanosis, or edema  ekg today reveals AV sequential pacing ICD interrogation- reviewed in detail today,  See PACEART report  Assessment and Plan:  1.  Chronic systolic dysfunction euvolemic today Stable on an appropriate medical regimen Normal BiV ICD function See Pace Art report No changes today  He will follow-up with Dr Alanda Amass and I will see as needed going forward. I will ask that Dr Alanda Amass repeat an echo at this point to assess response to CRT.  I would like to have a copy of the resulting echo sent to my office.

## 2012-05-30 ENCOUNTER — Encounter (HOSPITAL_COMMUNITY)
Admission: RE | Admit: 2012-05-30 | Discharge: 2012-05-30 | Disposition: A | Discharge status: Home / Self Care | Payer: Medicare Other | Source: Ambulatory Visit | Attending: Cardiovascular Disease | Admitting: Cardiovascular Disease

## 2012-05-30 ENCOUNTER — Encounter (HOSPITAL_COMMUNITY): Payer: Self-pay | Admitting: Pharmacy Technician

## 2012-05-30 NOTE — Progress Notes (Addendum)
EKG 01/17/12 on EPIC, Chest x-ray 05/01/12 on EPIC, LOV note 04/12/12 Dr. Alanda Amass on chart, ECHO 12/01/11 on chart, stress test 06/10/09 on chart, perioperative prescription for implanted cardiac device programming on chart.

## 2012-05-30 NOTE — Patient Instructions (Addendum)
20 OTIS PORTAL  05/30/2012   Your procedure is scheduled on: 06/04/12  Report to [facility] at 1045 AM.  Call this number if you have problems the morning of surgery [phone]: [phone]   Remember: please bring inhaler   Do not eat food After Midnight on 06/03/12, clear liquids from midnight until 0715 AM on 06/04/12 then nothing by mouth.      Take these medicines the morning of surgery with A SIP OF WATER: protonix, albuterol if needed, amiodarone, metoprolol succinate, crestor   Do not wear jewelry, make-up or nail polish.  Do not wear lotions, powders, or perfumes. You may wear deodorant.  Do not shave 48 hours prior to surgery. Men may shave face and neck.  Do not bring valuables to the hospital.  Contacts, dentures or bridgework may not be worn into surgery.  Leave suitcase in the car. After surgery it may be brought to your room.  For patients admitted to the hospital, checkout time is 11:00 AM the day of discharge.   Please read over the following fact sheets that you were given: MRSA Information, clear liquids fact sheet, blood transfusion fact sheet, incentive spirometry fact sheet.  Birdie Sons, RN  pre op nurse call if needed [phone]    FAILURE TO FOLLOW THESE INSTRUCTIONS MAY RESULT IN CANCELLATION OF YOUR SURGERY   Patient Signature: ___________________________________________

## 2012-05-31 ENCOUNTER — Encounter (HOSPITAL_COMMUNITY)
Admission: RE | Admit: 2012-05-31 | Discharge: 2012-05-31 | Disposition: A | Discharge status: Home / Self Care | Payer: Medicare Other | Source: Ambulatory Visit | Attending: Specialist | Admitting: Specialist

## 2012-05-31 ENCOUNTER — Ambulatory Visit (HOSPITAL_COMMUNITY)
Admission: RE | Admit: 2012-05-31 | Discharge: 2012-05-31 | Disposition: A | Discharge status: Home / Self Care | Payer: Medicare Other | Source: Ambulatory Visit | Attending: Orthopedic Surgery | Admitting: Orthopedic Surgery

## 2012-05-31 ENCOUNTER — Ambulatory Visit: Payer: Medicare Other | Admitting: Physical Therapy

## 2012-05-31 ENCOUNTER — Ambulatory Visit (INDEPENDENT_AMBULATORY_CARE_PROVIDER_SITE_OTHER): Payer: Medicare Other

## 2012-05-31 ENCOUNTER — Encounter (HOSPITAL_COMMUNITY): Payer: Self-pay

## 2012-05-31 DIAGNOSIS — M898X9 Other specified disorders of bone, unspecified site: Secondary | ICD-10-CM | POA: Diagnosis not present

## 2012-05-31 DIAGNOSIS — M1710 Unilateral primary osteoarthritis, unspecified knee: Secondary | ICD-10-CM | POA: Insufficient documentation

## 2012-05-31 DIAGNOSIS — Z0183 Encounter for blood typing: Secondary | ICD-10-CM | POA: Insufficient documentation

## 2012-05-31 DIAGNOSIS — J309 Allergic rhinitis, unspecified: Secondary | ICD-10-CM

## 2012-05-31 DIAGNOSIS — M25469 Effusion, unspecified knee: Secondary | ICD-10-CM | POA: Diagnosis not present

## 2012-05-31 DIAGNOSIS — IMO0002 Reserved for concepts with insufficient information to code with codable children: Secondary | ICD-10-CM | POA: Diagnosis not present

## 2012-05-31 DIAGNOSIS — Z01818 Encounter for other preprocedural examination: Secondary | ICD-10-CM | POA: Insufficient documentation

## 2012-05-31 DIAGNOSIS — M25569 Pain in unspecified knee: Secondary | ICD-10-CM | POA: Diagnosis not present

## 2012-05-31 DIAGNOSIS — Z01812 Encounter for preprocedural laboratory examination: Secondary | ICD-10-CM | POA: Insufficient documentation

## 2012-05-31 HISTORY — DX: Major depressive disorder, single episode, unspecified: F32.9

## 2012-05-31 HISTORY — DX: Presence of cardiac pacemaker: Z95.0

## 2012-05-31 HISTORY — DX: Pneumonia, unspecified organism: J18.9

## 2012-05-31 HISTORY — DX: Headache: R51

## 2012-05-31 HISTORY — DX: Acute myocardial infarction, unspecified: I21.9

## 2012-05-31 HISTORY — DX: Anxiety disorder, unspecified: F41.9

## 2012-05-31 HISTORY — DX: Depression, unspecified: F32.A

## 2012-05-31 HISTORY — DX: Presence of automatic (implantable) cardiac defibrillator: Z95.810

## 2012-05-31 LAB — BASIC METABOLIC PANEL
CO2: 30 mEq/L (ref 19–32)
Calcium: 9.3 mg/dL (ref 8.4–10.5)
Chloride: 101 mEq/L (ref 96–112)
Glucose, Bld: 98 mg/dL (ref 70–99)
Potassium: 4.6 mEq/L (ref 3.5–5.1)
Sodium: 137 mEq/L (ref 135–145)

## 2012-05-31 LAB — URINALYSIS, ROUTINE W REFLEX MICROSCOPIC
Nitrite: NEGATIVE
Protein, ur: NEGATIVE mg/dL
Specific Gravity, Urine: 1.027 (ref 1.005–1.030)
Urobilinogen, UA: 1 mg/dL (ref 0.0–1.0)

## 2012-05-31 LAB — CBC
HCT: 39.2 % (ref 39.0–52.0)
Hemoglobin: 13.2 g/dL (ref 13.0–17.0)
MCH: 29 pg (ref 26.0–34.0)
MCV: 86.2 fL (ref 78.0–100.0)
RBC: 4.55 MIL/uL (ref 4.22–5.81)

## 2012-05-31 LAB — ABO/RH: ABO/RH(D): AB POS

## 2012-06-03 ENCOUNTER — Other Ambulatory Visit: Payer: Self-pay | Admitting: Orthopedic Surgery

## 2012-06-03 NOTE — H&P (Signed)
Corey [name] DOB: 06/27/1935 Married / Language: English / Race: White Male  H&P date: 05/31/12  Chief Complaint: L knee pain  History of Present Illness The patient is a 77 year old male who comes in today for a preoperative History and Physical. The patient is scheduled for a left total knee arthroplasty to be performed by Dr. Jeffrey C. Beane, MD at [facility] on 06/04/2012. Pt presents today with ongoing L knee pain x many years, progressively worsening. No new injury or change in activity. He had a prior football injury in college with a ligament tear that was reconstructed through a medial knee incision. He denies any difficulty in his recovery from that and actually had remained very active on this knee until a few years ago when he was forced to stop running. He has noted gradual worsening stiffness and pain. He has tried bracing the left knee without relief. Is unable to take NSAIDs as he is on coumadin for A.Fib but takes tylenol prn with no significant relief. Has had prior steroid injections with temporary relief, last injection L knee 2 months ago with zero relief. He does not have pain at rest but notes pain with most activity especially doing down hills or steps. He is in cardiac rehab and rides a bicycle often and works on strengthening exercises. He often notes grinding in the knee. Denies significant swelling. He is interested in discussing further tx options. He has progressively painful symptoms left knee now refractory to activity modification, bracing, steroid injections, tylenol, quad strengthening. At this point his arthritic changes are so severe that arthroscopic debridement or viscosupplementation would be of no benefit to him. Reasonable to proceed with TKA, and pt feels at this point he is ready to consider that.  Dr. Beane and the patient mutually agreed to proceed with a total knee replacement. Risks and benefits of the procedure were discussed  including stiffness, suboptimal range of motion, persistent pain, infection requiring removal of prosthesis and reinsertion, need for prophylactic antibiotics in the future, for example, dental procedures, possible need for manipulation, revision in the future and also anesthetic complications including DVT, PE, etc. We discussed the perioperative course, time in the hospital, postoperative recovery and the need for elevation to control swelling. We also discussed the predicted range of motion and the probability that squatting and kneeling would be unobtainable in the future. In addition, postoperative anticoagulation was discussed. We have obtained preoperative medical clearance as necessary (Dr. Bruce Swords, PCP & Dr. Weintraub, cardiologist). Provided the illustrated handout and discussed it in detail. They will enroll in the total joint replacement educational forum at the hospital.  He was directed to hold coumadin x 4-5 days pre-op and states he took his last dose 5/8.   Past MedicalHx Displacement, Lumbar Disc w/o myelopathy Spinal Stenosis, Lumbar  Osteoarthritis, Knee  Lumbar pain  Genu varum, acquired  Fx closed metacarpal bone NOS Prostate Cancer Sleep Apnea. uses CPAP Hypercholesterolemia Asthma Diverticulitis Of Colon Cardiac Arrhythmia . A.Fib, on coumadin High blood pressure Anxiety Disorder Depression Pneumonia Myocardial infarction. 1993 Pacemaker Gastroesophageal Reflux Disease Chronic fatigue syndrome Mumps Measles   Allergies Hydrocodone-Acetaminophen *ANALGESICS - OPIOID* Percocet *ANALGESICS - OPIOID*   Family History Heart Disease. father and brother Father. deceased from MI at 46 Siblings. brother deceased at 45, MI   Social History Tobacco use. former smoker, quit in 1966 Number of flights of stairs before winded. 4-5 Pain Contract. no Alcohol use. current drinker; drinks beer; only occasionally per   week Marital status.  married Current work status. working full time Drug/Alcohol Rehab (Currently). no Tobacco / smoke exposure. no Living situation. live with spouse with 14 steps to 2nd level of home where bedroom and full bath are located, 10 steps to front of house or 2 steps to back of house Drug/Alcohol Rehab (Previously). no Children. 4 Illicit drug use. no Exercise. Exercises weekly; does running / walking Advance Directives. living will, POA Post-Surgical Plans. likely short term rehab stay, wife will be going out of country 9 days post-op   Medication History Advair Diskus (500-50MCG/DOSE Aero Pow Br Act, Inhalation) Active. Ambien (5MG Tablet, Oral) Active. Aspirin EC (81MG Tablet DR, Oral) Active. Toprol XL (25MG Tablet ER 24HR, Oral) Active. Pantoprazole Sodium (40MG Tablet DR, Oral) Active. Xopenex (1.25MG/3ML Nebulized Soln, Inhalation) Active. Crestor (5MG Tablet, Oral) Active. Tylenol (325MG Tablet, 1 (one) Oral) Active. Pacerone (200MG Tablet, Oral) Active. Coumadin (2MG Tablet, Oral) Active. Nasonex (50MCG/ACT Suspension, Nasal) Active. MiraLax ( Oral) Active. Ramipril (10MG Capsule, Oral) Active. Fluticasone Propionate (50MCG/ACT Suspension, Nasal) Active.   Past Surgical History Prostatectomy; Transurethral. for CA Tonsillectomy Other Orthopaedic Surgery. right knee wedge osteotomy Arthroscopic Knee Surgery - Right Spinal Decompression - Lower Back   Review of Systems General:Present- Memory Loss (slight). Not Present- Chills, Fever, Night Sweats, Fatigue, Weight Gain and Weight Loss. Skin:Not Present- Hives, Itching, Rash, Eczema and Lesions. HEENT:Present- Tinnitus. Not Present- Headache, Double Vision, Visual Loss, Hearing Loss and Dentures. Respiratory:Present- Allergies and Chronic Cough. Not Present- Shortness of breath with exertion, Shortness of breath at rest and Coughing up blood. Cardiovascular:Not Present- Chest Pain, Racing/skipping  heartbeats, Difficulty Breathing Lying Down, Murmur, Swelling and Palpitations. Gastrointestinal:Present- Constipation. Not Present- Bloody Stool, Heartburn, Abdominal Pain, Vomiting, Nausea, Diarrhea, Difficulty Swallowing, Jaundice and Loss of appetitie. Male Genitourinary:Present- Urinating at Night. Not Present- Urinary frequency, Blood in Urine, Weak urinary stream, Discharge, Flank Pain, Incontinence, Painful Urination, Urgency and Urinary Retention. Musculoskeletal:Present- Joint Pain and Back Pain. Not Present- Muscle Weakness, Muscle Pain, Joint Swelling, Morning Stiffness and Spasms. Neurological:Not Present- Tremor, Dizziness, Blackout spells, Paralysis, Difficulty with balance and Weakness. Psychiatric:Present- Insomnia.   Vitals 05/31/2012 1:50 PM BP: 127/68 (Sitting, Left Arm, Standard) Weight: 182 lb Height: 72.5 in Body Surface Area: 2.05 m Body Mass Index: 24.34 kg/m   Physical Exam The physical exam findings are as follows:  General Mental Status - Alert, cooperative and good historian. General Appearance- pleasant. Not in acute distress. Orientation- Oriented X3. Build & Nutrition- Well nourished and Well developed.  Head and Neck Head- normocephalic, atraumatic . Neck Global Assessment- supple. no bruit auscultated on the right and no bruit auscultated on the left.  Eye Pupil- Bilateral- Regular and Round. Motion- Bilateral- EOMI.  Chest and Lung Exam Auscultation: Breath sounds:- clear at anterior chest wall and - clear at posterior chest wall. Adventitious sounds:- No Adventitious sounds.  Cardiovascular Auscultation:Rhythm- Regular rate and rhythm. Heart Sounds- S1 WNL and S2 WNL. Murmurs & Other Heart Sounds:Auscultation of the heart reveals - No Murmurs.  Abdomen Palpation/Percussion:Tenderness- Abdomen is non-tender to palpation. Rigidity (guarding)- Abdomen is soft. Auscultation:Auscultation of the abdomen  reveals - Bowel sounds normal.  Male Genitourinary Not done, not pertinent to present illness  Musculoskeletal Left Knee: Inspection and Palpation: Tenderness - medial joint line tender to palpation. no tenderness to palpation of the superior calf, no tenderness to palpation of the quadriceps tendon, no tenderness to palpation of the patellar tendon, no tenderness to palpation of the lateral joint line, no tenderness to palpation of   the fibular head and no tenderness to palpation of the peroneal nerve. Crepitus - mild. Effusion - trace. Tissue tension/texture is - soft. Muscle tone - no quadriceps atrophy noted. Pulses - 2+. Sensation - intact to light touch. Alignment: Tibiofemoral Alignment - varus alignment. Skin: Color - no ecchymosis and no erythema. Temperature - normal warmth. Strength and Tone: Quadriceps - 5/5. Hamstrings - 5/5. ROM: - Full ROM of the knee and - pain-free. Instability - Valgus Laxity at 30 - None. Valgus Laxity at 0 - None. Varus Laxity at 30 - None. Varus Laxity at 0 - None. Lachman - Negative. Anterior Drawer Test - Negative. Posterior Drawer Test - Negative. Deformities/Malalignments/Discrepancies - no deformities noted. Special Tests: McMurray Test (lateral) - negative. McMurray Test (medial) - negative. Patellar Compression Pain - mild pain. Left Ankle: ROM: - Full and pain-free ROM of the ankle. Gait and Station: Evaluation of Gait: Gait Pattern - antalgic. Abnormal Gait Patterns - Note: slight varus thrust left knee  Lymphatic General Lymphatics Description - No Localized lymphadenopathy.   left knee xrays from 2/14 reviewed by Dr. Beane with severe, end-stage tricompartmental DJD left knee, with bone-on-bone changes medial compartment. Varus deformity. No fx, subluxation, dislocation, lytic or blastic lesions.  Assessment & Plan Osteoarthritis, Knee, left  Pt with L knee severe DJD, refractory to conservative tx as listed above, scheduled for L  TKA by Dr Beane 06/04/12. Again discussed risks, complications, and alternatives including but not limited to DVT, PE, infx, bleeding, failure of procedure, need for secondary procedure, scarring, nerve injury, anesthesia risk, even death. Discussed hospital course and post-op course. All questions answered and pt desires to proceed. Have obtained clearance from Dr Swords and Dr Weintraub. Pt is already holding his coumadin starting 5/8. Pre-op INR supratherapeutic, will redraw AM of surgery. NPO after MN night prior to surgery. His wife will be away out of the country 9 days post-op so we discussed likely rehab stay to continue to work on PT. Will plan to bridge with lovenox post-op and resume coumadin to therapeutic level prior to D/C. He will follow up 10-14 days post-op for xrays and staple removal. Reviewed importance of post-op ROM exercises to regain full function and ice and elevation to keep swelling down.    Signed electronically by Angelette Ganus M Tamikka Pilger PA-C for Dr. Beane  

## 2012-06-04 ENCOUNTER — Encounter (HOSPITAL_COMMUNITY): Payer: Medicare Other

## 2012-06-04 ENCOUNTER — Encounter (HOSPITAL_COMMUNITY): Payer: Self-pay | Admitting: Anesthesiology

## 2012-06-04 ENCOUNTER — Ambulatory Visit (HOSPITAL_COMMUNITY): Payer: Medicare Other | Admitting: Anesthesiology

## 2012-06-04 ENCOUNTER — Inpatient Hospital Stay (HOSPITAL_COMMUNITY): Payer: Medicare Other

## 2012-06-04 ENCOUNTER — Encounter (HOSPITAL_COMMUNITY): Payer: Self-pay | Admitting: *Deleted

## 2012-06-04 ENCOUNTER — Encounter (HOSPITAL_COMMUNITY)
Admission: RE | Disposition: A | Discharge status: Skilled Nursing Facility | Payer: Self-pay | Source: Ambulatory Visit | Attending: Specialist

## 2012-06-04 ENCOUNTER — Inpatient Hospital Stay (HOSPITAL_COMMUNITY)
Admission: RE | Admit: 2012-06-04 | Discharge: 2012-06-07 | DRG: 470 | Disposition: A | Discharge status: Skilled Nursing Facility | Payer: Medicare Other | Source: Ambulatory Visit | Attending: Specialist | Admitting: Specialist

## 2012-06-04 ENCOUNTER — Ambulatory Visit: Payer: Medicare Other | Admitting: Internal Medicine

## 2012-06-04 DIAGNOSIS — Z8546 Personal history of malignant neoplasm of prostate: Secondary | ICD-10-CM | POA: Diagnosis not present

## 2012-06-04 DIAGNOSIS — G4700 Insomnia, unspecified: Secondary | ICD-10-CM | POA: Diagnosis not present

## 2012-06-04 DIAGNOSIS — F329 Major depressive disorder, single episode, unspecified: Secondary | ICD-10-CM | POA: Diagnosis present

## 2012-06-04 DIAGNOSIS — Z95810 Presence of automatic (implantable) cardiac defibrillator: Secondary | ICD-10-CM

## 2012-06-04 DIAGNOSIS — S8990XA Unspecified injury of unspecified lower leg, initial encounter: Secondary | ICD-10-CM | POA: Diagnosis not present

## 2012-06-04 DIAGNOSIS — Z87891 Personal history of nicotine dependence: Secondary | ICD-10-CM | POA: Diagnosis not present

## 2012-06-04 DIAGNOSIS — R279 Unspecified lack of coordination: Secondary | ICD-10-CM | POA: Diagnosis not present

## 2012-06-04 DIAGNOSIS — Z471 Aftercare following joint replacement surgery: Secondary | ICD-10-CM | POA: Diagnosis not present

## 2012-06-04 DIAGNOSIS — Z950 Presence of cardiac pacemaker: Secondary | ICD-10-CM | POA: Diagnosis not present

## 2012-06-04 DIAGNOSIS — R269 Unspecified abnormalities of gait and mobility: Secondary | ICD-10-CM | POA: Diagnosis not present

## 2012-06-04 DIAGNOSIS — IMO0002 Reserved for concepts with insufficient information to code with codable children: Secondary | ICD-10-CM | POA: Diagnosis not present

## 2012-06-04 DIAGNOSIS — G4733 Obstructive sleep apnea (adult) (pediatric): Secondary | ICD-10-CM | POA: Diagnosis present

## 2012-06-04 DIAGNOSIS — Z7901 Long term (current) use of anticoagulants: Secondary | ICD-10-CM

## 2012-06-04 DIAGNOSIS — I4891 Unspecified atrial fibrillation: Secondary | ICD-10-CM | POA: Diagnosis present

## 2012-06-04 DIAGNOSIS — M1712 Unilateral primary osteoarthritis, left knee: Secondary | ICD-10-CM

## 2012-06-04 DIAGNOSIS — J45909 Unspecified asthma, uncomplicated: Secondary | ICD-10-CM | POA: Diagnosis not present

## 2012-06-04 DIAGNOSIS — E785 Hyperlipidemia, unspecified: Secondary | ICD-10-CM | POA: Diagnosis not present

## 2012-06-04 DIAGNOSIS — M1710 Unilateral primary osteoarthritis, unspecified knee: Principal | ICD-10-CM | POA: Diagnosis present

## 2012-06-04 DIAGNOSIS — F3289 Other specified depressive episodes: Secondary | ICD-10-CM | POA: Diagnosis present

## 2012-06-04 DIAGNOSIS — M1990 Unspecified osteoarthritis, unspecified site: Secondary | ICD-10-CM | POA: Diagnosis not present

## 2012-06-04 DIAGNOSIS — F411 Generalized anxiety disorder: Secondary | ICD-10-CM | POA: Diagnosis not present

## 2012-06-04 DIAGNOSIS — K2100 Gastro-esophageal reflux disease with esophagitis, without bleeding: Secondary | ICD-10-CM | POA: Diagnosis not present

## 2012-06-04 DIAGNOSIS — I252 Old myocardial infarction: Secondary | ICD-10-CM

## 2012-06-04 DIAGNOSIS — I10 Essential (primary) hypertension: Secondary | ICD-10-CM | POA: Diagnosis present

## 2012-06-04 DIAGNOSIS — Z96659 Presence of unspecified artificial knee joint: Secondary | ICD-10-CM | POA: Diagnosis not present

## 2012-06-04 DIAGNOSIS — M6281 Muscle weakness (generalized): Secondary | ICD-10-CM | POA: Diagnosis not present

## 2012-06-04 DIAGNOSIS — M25569 Pain in unspecified knee: Secondary | ICD-10-CM | POA: Diagnosis not present

## 2012-06-04 HISTORY — PX: TOTAL KNEE ARTHROPLASTY: SHX125

## 2012-06-04 LAB — CREATININE, SERUM
Creatinine, Ser: 1.06 mg/dL (ref 0.50–1.35)
GFR calc Af Amer: 78 mL/min — ABNORMAL LOW (ref >90)

## 2012-06-04 LAB — TYPE AND SCREEN
ABO/RH(D): AB POS
Antibody Screen: NEGATIVE

## 2012-06-04 LAB — CBC
Hemoglobin: 12.7 g/dL — ABNORMAL LOW (ref 13.0–17.0)
MCV: 85.7 fL (ref 78.0–100.0)
Platelets: 210 10*3/uL (ref 150–400)
RBC: 4.41 MIL/uL (ref 4.22–5.81)
WBC: 7.6 10*3/uL (ref 4.0–10.5)

## 2012-06-04 LAB — PROTIME-INR: Prothrombin Time: 19.1 seconds — ABNORMAL HIGH (ref 11.6–15.2)

## 2012-06-04 SURGERY — ARTHROPLASTY, KNEE, TOTAL
Anesthesia: General | Site: Knee | Laterality: Left | Wound class: Clean

## 2012-06-04 MED ORDER — COUMADIN BOOK
Freq: Once | Status: AC
  Administered 2012-06-05: 10:00:00
  Filled 2012-06-04: qty 1

## 2012-06-04 MED ORDER — FLUTICASONE PROPIONATE 50 MCG/ACT NA SUSP
1.0000 | Freq: Every day | NASAL | Status: DC
  Administered 2012-06-05: 1 via NASAL
  Filled 2012-06-04: qty 16

## 2012-06-04 MED ORDER — CEFAZOLIN SODIUM-DEXTROSE 2-3 GM-% IV SOLR
2.0000 g | INTRAVENOUS | Status: AC
  Administered 2012-06-04: 2 g via INTRAVENOUS

## 2012-06-04 MED ORDER — POLYETHYLENE GLYCOL 3350 17 G PO PACK: 8.5000 g | PACK | Freq: Every day | ORAL | Status: DC | PRN

## 2012-06-04 MED ORDER — ONDANSETRON HCL 4 MG PO TABS: 4.0000 mg | ORAL_TABLET | Freq: Four times a day (QID) | ORAL | Status: DC | PRN

## 2012-06-04 MED ORDER — MENTHOL 3 MG MT LOZG: 1.0000 | LOZENGE | OROMUCOSAL | Status: DC | PRN

## 2012-06-04 MED ORDER — PROMETHAZINE HCL 25 MG/ML IJ SOLN: 6.2500 mg | INTRAMUSCULAR | Status: DC | PRN

## 2012-06-04 MED ORDER — ACETAMINOPHEN 10 MG/ML IV SOLN
INTRAVENOUS | Status: AC
  Filled 2012-06-04: qty 100

## 2012-06-04 MED ORDER — SUCCINYLCHOLINE CHLORIDE 20 MG/ML IJ SOLN
INTRAMUSCULAR | Status: DC | PRN
  Administered 2012-06-04: 100 mg via INTRAVENOUS

## 2012-06-04 MED ORDER — BUPIVACAINE LIPOSOME 1.3 % IJ SUSP
20.0000 mL | Freq: Once | INTRAMUSCULAR | Status: DC
  Filled 2012-06-04 (×2): qty 20

## 2012-06-04 MED ORDER — SODIUM CHLORIDE 0.9 % IR SOLN
Status: DC | PRN
  Administered 2012-06-04: 14:00:00

## 2012-06-04 MED ORDER — BUPIVACAINE-EPINEPHRINE 0.5% -1:200000 IJ SOLN
INTRAMUSCULAR | Status: DC | PRN
  Administered 2012-06-04: 20 mL

## 2012-06-04 MED ORDER — WARFARIN - PHARMACIST DOSING INPATIENT: Freq: Every day | Status: DC

## 2012-06-04 MED ORDER — ACETAMINOPHEN 325 MG PO TABS
650.0000 mg | ORAL_TABLET | Freq: Four times a day (QID) | ORAL | Status: DC | PRN
  Administered 2012-06-05 – 2012-06-06 (×2): 650 mg via ORAL
  Filled 2012-06-04 (×2): qty 2

## 2012-06-04 MED ORDER — WARFARIN VIDEO
Freq: Once | Status: AC
  Administered 2012-06-05: 10:00:00

## 2012-06-04 MED ORDER — PHENYLEPHRINE HCL 10 MG/ML IJ SOLN
INTRAMUSCULAR | Status: DC | PRN
  Administered 2012-06-04: 80 ug via INTRAVENOUS
  Administered 2012-06-04: 160 ug via INTRAVENOUS
  Administered 2012-06-04 (×2): 80 ug via INTRAVENOUS

## 2012-06-04 MED ORDER — CEFAZOLIN SODIUM-DEXTROSE 2-3 GM-% IV SOLR
INTRAVENOUS | Status: AC
  Filled 2012-06-04: qty 50

## 2012-06-04 MED ORDER — WARFARIN SODIUM 5 MG PO TABS
5.0000 mg | ORAL_TABLET | Freq: Once | ORAL | Status: DC
  Filled 2012-06-04: qty 1

## 2012-06-04 MED ORDER — POTASSIUM CHLORIDE IN NACL 20-0.9 MEQ/L-% IV SOLN
INTRAVENOUS | Status: DC
  Administered 2012-06-04 – 2012-06-05 (×2): via INTRAVENOUS
  Filled 2012-06-04 (×4): qty 1000

## 2012-06-04 MED ORDER — FENTANYL CITRATE 0.05 MG/ML IJ SOLN
INTRAMUSCULAR | Status: DC | PRN
  Administered 2012-06-04 (×2): 50 ug via INTRAVENOUS
  Administered 2012-06-04: 100 ug via INTRAVENOUS
  Administered 2012-06-04 (×2): 50 ug via INTRAVENOUS
  Administered 2012-06-04: 100 ug via INTRAVENOUS

## 2012-06-04 MED ORDER — CEFAZOLIN SODIUM-DEXTROSE 2-3 GM-% IV SOLR
2.0000 g | Freq: Four times a day (QID) | INTRAVENOUS | Status: AC
  Administered 2012-06-04 – 2012-06-05 (×3): 2 g via INTRAVENOUS
  Filled 2012-06-04 (×3): qty 50

## 2012-06-04 MED ORDER — ONDANSETRON HCL 4 MG/2ML IJ SOLN
INTRAMUSCULAR | Status: DC | PRN
  Administered 2012-06-04: 4 mg via INTRAVENOUS

## 2012-06-04 MED ORDER — METOPROLOL SUCCINATE ER 50 MG PO TB24
50.0000 mg | ORAL_TABLET | Freq: Two times a day (BID) | ORAL | Status: DC
  Administered 2012-06-04 – 2012-06-07 (×6): 50 mg via ORAL
  Filled 2012-06-04 (×8): qty 1

## 2012-06-04 MED ORDER — ACETAMINOPHEN 10 MG/ML IV SOLN
INTRAVENOUS | Status: DC | PRN
  Administered 2012-06-04: 1000 mg via INTRAVENOUS

## 2012-06-04 MED ORDER — ZOLPIDEM TARTRATE 5 MG PO TABS
2.5000 mg | ORAL_TABLET | Freq: Every evening | ORAL | Status: DC | PRN
  Administered 2012-06-05 – 2012-06-06 (×3): 2.5 mg via ORAL
  Filled 2012-06-04 (×4): qty 1

## 2012-06-04 MED ORDER — METHOCARBAMOL 100 MG/ML IJ SOLN: 500.0000 mg | Freq: Four times a day (QID) | INTRAVENOUS | Status: DC | PRN

## 2012-06-04 MED ORDER — ACETAMINOPHEN 650 MG RE SUPP: 650.0000 mg | Freq: Four times a day (QID) | RECTAL | Status: DC | PRN

## 2012-06-04 MED ORDER — PHENOL 1.4 % MT LIQD: 1.0000 | OROMUCOSAL | Status: DC | PRN

## 2012-06-04 MED ORDER — METOCLOPRAMIDE HCL 5 MG/ML IJ SOLN: 5.0000 mg | Freq: Three times a day (TID) | INTRAMUSCULAR | Status: DC | PRN

## 2012-06-04 MED ORDER — LACTATED RINGERS IV SOLN
INTRAVENOUS | Status: DC
  Administered 2012-06-04: 13:00:00 via INTRAVENOUS

## 2012-06-04 MED ORDER — DEXAMETHASONE SODIUM PHOSPHATE 10 MG/ML IJ SOLN
INTRAMUSCULAR | Status: DC | PRN
  Administered 2012-06-04: 5 mg via INTRAVENOUS

## 2012-06-04 MED ORDER — PROPOFOL 10 MG/ML IV BOLUS
INTRAVENOUS | Status: DC | PRN
  Administered 2012-06-04: 130 mg via INTRAVENOUS

## 2012-06-04 MED ORDER — BUPIVACAINE-EPINEPHRINE (PF) 0.5% -1:200000 IJ SOLN
INTRAMUSCULAR | Status: AC
  Filled 2012-06-04: qty 10

## 2012-06-04 MED ORDER — SODIUM CHLORIDE 0.9 % IR SOLN
Status: DC | PRN
  Administered 2012-06-04: 1000 mL

## 2012-06-04 MED ORDER — TRAMADOL HCL 50 MG PO TABS
50.0000 mg | ORAL_TABLET | Freq: Four times a day (QID) | ORAL | Status: DC | PRN
  Administered 2012-06-04 – 2012-06-06 (×5): 50 mg via ORAL
  Filled 2012-06-04 (×5): qty 1

## 2012-06-04 MED ORDER — ALBUTEROL SULFATE HFA 108 (90 BASE) MCG/ACT IN AERS
2.0000 | INHALATION_SPRAY | Freq: Four times a day (QID) | RESPIRATORY_TRACT | Status: DC | PRN
  Filled 2012-06-04: qty 6.7

## 2012-06-04 MED ORDER — ENOXAPARIN SODIUM 30 MG/0.3ML ~~LOC~~ SOLN
30.0000 mg | SUBCUTANEOUS | Status: DC
  Administered 2012-06-05 – 2012-06-07 (×3): 30 mg via SUBCUTANEOUS
  Filled 2012-06-04 (×4): qty 0.3

## 2012-06-04 MED ORDER — POTASSIUM CHLORIDE IN NACL 20-0.9 MEQ/L-% IV SOLN
INTRAVENOUS | Status: AC
  Filled 2012-06-04: qty 1000

## 2012-06-04 MED ORDER — SODIUM CHLORIDE 0.9 % IV SOLN
10.0000 mg | INTRAVENOUS | Status: DC | PRN
  Administered 2012-06-04: 25 ug/min via INTRAVENOUS

## 2012-06-04 MED ORDER — TRAMADOL HCL 50 MG PO TABS: 50.0000 mg | ORAL_TABLET | Freq: Four times a day (QID) | ORAL | Status: DC | PRN

## 2012-06-04 MED ORDER — WARFARIN SODIUM 2.5 MG PO TABS: 2.5000 mg | ORAL_TABLET | Freq: Every day | ORAL | Status: DC

## 2012-06-04 MED ORDER — RAMIPRIL 10 MG PO CAPS
10.0000 mg | ORAL_CAPSULE | Freq: Every day | ORAL | Status: DC
  Administered 2012-06-04 – 2012-06-07 (×4): 10 mg via ORAL
  Filled 2012-06-04 (×4): qty 1

## 2012-06-04 MED ORDER — SPIRONOLACTONE 25 MG PO TABS
25.0000 mg | ORAL_TABLET | Freq: Every day | ORAL | Status: DC
  Administered 2012-06-04 – 2012-06-07 (×4): 25 mg via ORAL
  Filled 2012-06-04 (×4): qty 1

## 2012-06-04 MED ORDER — HYDROMORPHONE HCL PF 1 MG/ML IJ SOLN: 0.2500 mg | INTRAMUSCULAR | Status: DC | PRN

## 2012-06-04 MED ORDER — ACETAMINOPHEN 10 MG/ML IV SOLN
1000.0000 mg | Freq: Four times a day (QID) | INTRAVENOUS | Status: AC
  Administered 2012-06-04 – 2012-06-05 (×4): 1000 mg via INTRAVENOUS
  Filled 2012-06-04 (×6): qty 100

## 2012-06-04 MED ORDER — METOCLOPRAMIDE HCL 10 MG PO TABS: 5.0000 mg | ORAL_TABLET | Freq: Three times a day (TID) | ORAL | Status: DC | PRN

## 2012-06-04 MED ORDER — DOCUSATE SODIUM 100 MG PO CAPS
100.0000 mg | ORAL_CAPSULE | Freq: Two times a day (BID) | ORAL | Status: DC
  Administered 2012-06-04 – 2012-06-07 (×6): 100 mg via ORAL

## 2012-06-04 MED ORDER — BUPIVACAINE LIPOSOME 1.3 % IJ SUSP
INTRAMUSCULAR | Status: DC | PRN
  Administered 2012-06-04: 20 mL

## 2012-06-04 MED ORDER — PANTOPRAZOLE SODIUM 40 MG PO TBEC
40.0000 mg | DELAYED_RELEASE_TABLET | Freq: Every day | ORAL | Status: DC
  Administered 2012-06-05 – 2012-06-07 (×3): 40 mg via ORAL
  Filled 2012-06-04 (×3): qty 1

## 2012-06-04 MED ORDER — METHOCARBAMOL 500 MG PO TABS
500.0000 mg | ORAL_TABLET | Freq: Four times a day (QID) | ORAL | Status: DC | PRN
  Administered 2012-06-04 – 2012-06-07 (×9): 500 mg via ORAL
  Filled 2012-06-04 (×9): qty 1

## 2012-06-04 MED ORDER — MORPHINE SULFATE 2 MG/ML IJ SOLN
2.0000 mg | INTRAMUSCULAR | Status: DC | PRN
  Administered 2012-06-04 – 2012-06-05 (×2): 2 mg via INTRAVENOUS
  Filled 2012-06-04 (×2): qty 1

## 2012-06-04 MED ORDER — LORAZEPAM 0.5 MG PO TABS
0.5000 mg | ORAL_TABLET | Freq: Every evening | ORAL | Status: DC
  Administered 2012-06-05 – 2012-06-06 (×2): 0.5 mg via ORAL
  Filled 2012-06-04 (×2): qty 1

## 2012-06-04 MED ORDER — ONDANSETRON HCL 4 MG/2ML IJ SOLN: 4.0000 mg | Freq: Four times a day (QID) | INTRAMUSCULAR | Status: DC | PRN

## 2012-06-04 MED ORDER — LIDOCAINE HCL (PF) 2 % IJ SOLN
INTRAMUSCULAR | Status: DC | PRN
  Administered 2012-06-04: 75 mg

## 2012-06-04 MED ORDER — AMIODARONE HCL 200 MG PO TABS
200.0000 mg | ORAL_TABLET | Freq: Every day | ORAL | Status: DC
  Administered 2012-06-05 – 2012-06-07 (×3): 200 mg via ORAL
  Filled 2012-06-04 (×3): qty 1

## 2012-06-04 SURGICAL SUPPLY — 65 items
BAG ZIPLOCK 12X15 (MISCELLANEOUS) ×2 IMPLANT
BANDAGE ELASTIC 4 VELCRO ST LF (GAUZE/BANDAGES/DRESSINGS) ×2 IMPLANT
BANDAGE ELASTIC 6 VELCRO ST LF (GAUZE/BANDAGES/DRESSINGS) ×2 IMPLANT
BANDAGE ESMARK 6X9 LF (GAUZE/BANDAGES/DRESSINGS) ×1 IMPLANT
BLADE SAG 18X100X1.27 (BLADE) ×2 IMPLANT
BLADE SAW SGTL 13.0X1.19X90.0M (BLADE) ×2 IMPLANT
BNDG ESMARK 6X9 LF (GAUZE/BANDAGES/DRESSINGS) ×2
BOWL SMART MIX CTS (DISPOSABLE) ×2 IMPLANT
CEMENT HV SMART SET (Cement) ×8 IMPLANT
CHLORAPREP W/TINT 26ML (MISCELLANEOUS) IMPLANT
CLOTH 2% CHLOROHEXIDINE 3PK (PERSONAL CARE ITEMS) ×2 IMPLANT
CLOTH BEACON ORANGE TIMEOUT ST (SAFETY) ×2 IMPLANT
CUFF TOURN SGL QUICK 34 (TOURNIQUET CUFF) ×1
CUFF TRNQT CYL 34X4X40X1 (TOURNIQUET CUFF) ×1 IMPLANT
DECANTER SPIKE VIAL GLASS SM (MISCELLANEOUS) ×2 IMPLANT
DRAPE LG THREE QUARTER DISP (DRAPES) ×2 IMPLANT
DRAPE ORTHO SPLIT 77X108 STRL (DRAPES) ×2
DRAPE POUCH INSTRU U-SHP 10X18 (DRAPES) ×2 IMPLANT
DRAPE SURG ORHT 6 SPLT 77X108 (DRAPES) ×2 IMPLANT
DRAPE U-SHAPE 47X51 STRL (DRAPES) ×2 IMPLANT
DRSG ADAPTIC 3X8 NADH LF (GAUZE/BANDAGES/DRESSINGS) IMPLANT
DRSG AQUACEL AG ADV 3.5X10 (GAUZE/BANDAGES/DRESSINGS) ×2 IMPLANT
DRSG PAD ABDOMINAL 8X10 ST (GAUZE/BANDAGES/DRESSINGS) ×2 IMPLANT
DRSG TEGADERM 4X4.75 (GAUZE/BANDAGES/DRESSINGS) ×2 IMPLANT
DURAPREP 26ML APPLICATOR (WOUND CARE) ×2 IMPLANT
ELECT REM PT RETURN 9FT ADLT (ELECTROSURGICAL) ×2
ELECTRODE REM PT RTRN 9FT ADLT (ELECTROSURGICAL) ×1 IMPLANT
EVACUATOR 1/8 PVC DRAIN (DRAIN) ×2 IMPLANT
FACESHIELD LNG OPTICON STERILE (SAFETY) ×10 IMPLANT
GLOVE BIOGEL PI IND STRL 7.5 (GLOVE) ×1 IMPLANT
GLOVE BIOGEL PI IND STRL 8 (GLOVE) ×1 IMPLANT
GLOVE BIOGEL PI INDICATOR 7.5 (GLOVE) ×1
GLOVE BIOGEL PI INDICATOR 8 (GLOVE) ×1
GLOVE SURG SS PI 7.5 STRL IVOR (GLOVE) ×2 IMPLANT
GLOVE SURG SS PI 8.0 STRL IVOR (GLOVE) ×4 IMPLANT
GOWN PREVENTION PLUS LG XLONG (DISPOSABLE) IMPLANT
GOWN PREVENTION PLUS XLARGE (GOWN DISPOSABLE) IMPLANT
GOWN STRL REIN XL XLG (GOWN DISPOSABLE) ×4 IMPLANT
HANDPIECE INTERPULSE COAX TIP (DISPOSABLE) ×1
IMMOBILIZER KNEE 20 (SOFTGOODS) ×2
IMMOBILIZER KNEE 20 THIGH 36 (SOFTGOODS) ×1 IMPLANT
KIT BASIN OR (CUSTOM PROCEDURE TRAY) ×2 IMPLANT
MANIFOLD NEPTUNE II (INSTRUMENTS) ×2 IMPLANT
NEEDLE 27GAX1X1/2 (NEEDLE) ×2 IMPLANT
NS IRRIG 1000ML POUR BTL (IV SOLUTION) ×2 IMPLANT
PACK TOTAL JOINT (CUSTOM PROCEDURE TRAY) ×2 IMPLANT
PADDING CAST COTTON 6X4 STRL (CAST SUPPLIES) ×2 IMPLANT
POSITIONER SURGICAL ARM (MISCELLANEOUS) ×2 IMPLANT
SET HNDPC FAN SPRY TIP SCT (DISPOSABLE) ×1 IMPLANT
SPONGE GAUZE 4X4 12PLY (GAUZE/BANDAGES/DRESSINGS) ×2 IMPLANT
SPONGE SURGIFOAM ABS GEL 100 (HEMOSTASIS) ×2 IMPLANT
STAPLER VISISTAT (STAPLE) ×2 IMPLANT
SUCTION FRAZIER 12FR DISP (SUCTIONS) ×2 IMPLANT
SUT BONE WAX W31G (SUTURE) ×2 IMPLANT
SUT VIC AB 1 CT1 27 (SUTURE) ×1
SUT VIC AB 1 CT1 27XBRD ANTBC (SUTURE) ×1 IMPLANT
SUT VIC AB 2-0 CT1 27 (SUTURE) ×3
SUT VIC AB 2-0 CT1 TAPERPNT 27 (SUTURE) ×3 IMPLANT
SUT VLOC 180 0 24IN GS25 (SUTURE) ×2 IMPLANT
SYR 30ML LL (SYRINGE) ×2 IMPLANT
TOWEL OR 17X26 10 PK STRL BLUE (TOWEL DISPOSABLE) ×2 IMPLANT
TOWER CARTRIDGE SMART MIX (DISPOSABLE) ×2 IMPLANT
TRAY FOLEY CATH 14FRSI W/METER (CATHETERS) ×2 IMPLANT
WATER STERILE IRR 1500ML POUR (IV SOLUTION) ×2 IMPLANT
WRAP KNEE MAXI GEL POST OP (GAUZE/BANDAGES/DRESSINGS) ×2 IMPLANT

## 2012-06-04 NOTE — Anesthesia Postprocedure Evaluation (Signed)
  Anesthesia Post-op Note  Patient: Corey [name]  Procedure(s) Performed: Procedure(s) (LRB): TOTAL KNEE ARTHROPLASTY (Left)  Patient Location: PACU  Anesthesia Type: General  Level of Consciousness: awake and alert   Airway and Oxygen Therapy: Patient Spontanous Breathing  Post-op Pain: mild  Post-op Assessment: Post-op Vital signs reviewed, Patient's Cardiovascular Status Stable, Respiratory Function Stable, Patent Airway and No signs of Nausea or vomiting  Last Vitals:  Filed Vitals:   06/04/12 1834  BP: 155/95  Pulse: 66  Temp: 36.7 C  Resp: 18    Post-op Vital Signs: stable   Complications: No apparent anesthesia complications. OSA precautions.

## 2012-06-04 NOTE — Interval H&P Note (Signed)
History and Physical Interval Note:  06/04/2012 1:08 PM  Corey [name]  has presented today for surgery, with the diagnosis of LEFT KNEE DJD  The various methods of treatment have been discussed with the patient and family. After consideration of risks, benefits and other options for treatment, the patient has consented to  Procedure(s): TOTAL KNEE ARTHROPLASTY (Left) as a surgical intervention .  The patient's history has been reviewed, patient examined, no change in status, stable for surgery.  I have reviewed the patient's chart and labs.  Questions were answered to the patient's satisfaction.     Katai Marsico C

## 2012-06-04 NOTE — Preoperative (Signed)
Beta Blockers   Reason not to administer Beta Blockers:Took Metoprolol this am. 

## 2012-06-04 NOTE — Plan of Care (Signed)
Problem: Consults Goal: Diagnosis- Total Joint Replacement Left total knee     

## 2012-06-04 NOTE — Anesthesia Preprocedure Evaluation (Addendum)
Anesthesia Evaluation  Patient identified by MRN, date of birth, ID band Patient awake    Reviewed: Allergy & Precautions, H&P , NPO status , Patient's Chart, lab work & pertinent test results  Airway Mallampati: II TM Distance: >3 FB Neck ROM: Full   Comment: He complains of hoarseness after vocal cord surgery at outside facility. Dental  (+) Dental Advisory Given   Pulmonary neg pulmonary ROS, asthma , sleep apnea , pneumonia -, resolved,  breath sounds clear to auscultation  Pulmonary exam normal       Cardiovascular Exercise Tolerance: Good hypertension, Pt. on medications and Pt. on home beta blockers + CAD and + Past MI negative cardio ROS  + dysrhythmias Atrial Fibrillation + pacemaker + Cardiac Defibrillator Rhythm:Regular Rate:Normal  Denies current symptoms of DOE, orthopnea, chest pain.  All cardiac studies and cardiology visits reviewed.    Neuro/Psych  Headaches, PSYCHIATRIC DISORDERS Anxiety Depression negative neurological ROS  negative psych ROS   GI/Hepatic negative GI ROS, Neg liver ROS, GERD-  Medicated,  Endo/Other  negative endocrine ROS  Renal/GU negative Renal ROS  negative genitourinary   Musculoskeletal negative musculoskeletal ROS (+)   Abdominal   Peds negative pediatric ROS (+)  Hematology negative hematology ROS (+)   Anesthesia Other Findings   Reproductive/Obstetrics negative OB ROS                          Anesthesia Physical Anesthesia Plan  ASA: IV  Anesthesia Plan: General   Post-op Pain Management:    Induction: Intravenous  Airway Management Planned: Oral ETT  Additional Equipment:   Intra-op Plan:   Post-operative Plan: Extubation in OR  Informed Consent: I have reviewed the patients History and Physical, chart, labs and discussed the procedure including the risks, benefits and alternatives for the proposed anesthesia with the patient or  authorized representative who has indicated his/her understanding and acceptance.   Dental advisory given  Plan Discussed with: CRNA  Anesthesia Plan Comments: (INR 1.66 today. EF 20%. At risk for CHF due to low EF. Clearance Dr. Johney Frame (AICD).  Will use judicious use of IVF. He may need FFP or pRBC later after procedure. Will have tourniquet during procedure. Discussed with Dr. Shelle Iron. Discussed risk of increased hoarseness from intubation today. Patient consents and voices understanding.)       Anesthesia Quick Evaluation

## 2012-06-04 NOTE — Progress Notes (Signed)
ANTICOAGULATION CONSULT NOTE - Initial Consult  Pharmacy Consult for warfarin Indication: VTE prophylaxis  Allergies  Allergen Reactions  . Hydrocodone Other (See Comments)    Severe panic attacks  . Oxycodone Other (See Comments)    Severe panic attacks    Patient Measurements:   Heparin Dosing Weight:   Vital Signs: Temp: 97.8 F (36.6 C) (05/12 1744) Temp src: Oral (05/12 1112) BP: 164/98 mmHg (05/12 1744) Pulse Rate: 72 (05/12 1744)  Labs:  Recent Labs  06/04/12 1135  LABPROT 19.1*  INR 1.66*    The CrCl is unknown because both a height and weight (above a minimum accepted value) are required for this calculation.   Medical History: Past Medical History  Diagnosis Date  . Alcohol abuse, episodic 05/19/2008  . ALLERGIC RHINITIS 04/15/2009  . COLONIC POLYPS, ADENOMATOUS, HX OF 02/14/2008  . DIVERTICULOSIS, COLON 02/14/2008  . ESOPHAGEAL STRICTURE 01/28/2008  . GERD 02/14/2008  . HYPERGLYCEMIA 11/16/2006  . HYPERLIPIDEMIA 02/14/2008  . INSOMNIA-SLEEP DISORDER-UNSPEC 02/11/2009  . 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 PROSTATE CANCER, HX OF 02/25/2000  . TRANSIENT ISCHEMIC ATTACKS, HX OF 11/16/2006  . Arthritis   . Atrial fibrillation 11/16/2006    remote CHF related to atrial fib with rapid ventricular response over 25 yrs per office note,  . ASTHMA 11/16/2006    sinusitis-    hx ?yeast patch/white patch on vocal cord as per [facility] 02/25/11-   . OBSTRUCTIVE SLEEP APNEA 11/10/2008  . SLEEP APNEA 10/03/2009    LOV Dr Maple Hudson 12/12 in EPIC    Moderate per patient- settings ?6/last sleep study years ago  . HYPERTENSION 11/16/2006  . CORONARY ARTERY DISEASE 11/16/2006    nonischemic cardiomyapathy, last stress test 2011- LOV with clearance note Dr  Alanda Amass and EKG 1/13 on chart  . Symptomatic bradycardia, secondary to sinus node dysfunction 07/08/2011    s/p [facility] Scientific PPM by Dr Royann Shivers, upgraded to BiV ICD 12/13 by Dr Johney Frame ([facility])  . Myocardial infarction 1993   . Pacemaker     St. Jude  . ICD (implantable cardiac defibrillator) in place   . Anxiety   . Depression   . Pneumonia     hx of  . Headache   . Cancer 2002    prostate cancer    Medications:  Scheduled:  . 0.9 % NaCl with KCl 20 mEq / L      . acetaminophen  1,000 mg Intravenous Q6H  . [START ON 06/05/2012] amiodarone  200 mg Oral Daily  . [COMPLETED]  ceFAZolin (ANCEF) IV  2 g Intravenous On Call to OR  .  ceFAZolin (ANCEF) IV  2 g Intravenous Q6H  . docusate sodium  100 mg Oral BID  . [START ON 06/05/2012] enoxaparin (LOVENOX) injection  30 mg Subcutaneous Q24H  . fluticasone  1 spray Each Nare Daily  . LORazepam  0.5 mg Oral QPM  . metoprolol succinate  50 mg Oral BID AC  . [START ON 06/05/2012] pantoprazole  40 mg Oral Daily  . ramipril  10 mg Oral Daily  . spironolactone  25 mg Oral Daily  . [DISCONTINUED] bupivacaine liposome  20 mL Infiltration Once  . [DISCONTINUED] warfarin  2.5-5 mg Oral Daily   Infusions:  . 0.9 % NaCl with KCl 20 mEq / L 50 mL/hr at 06/04/12 1719  . [DISCONTINUED] lactated ringers     PRN: acetaminophen, acetaminophen, albuterol, menthol-cetylpyridinium, methocarbamol (ROBAXIN) IV, methocarbamol, metoCLOPramide (REGLAN) injection, metoCLOPramide, morphine injection, ondansetron (ZOFRAN) IV, ondansetron, phenol, polyethylene  glycol, traMADol, zolpidem, [DISCONTINUED] bupivacaine liposome, [DISCONTINUED] bupivacaine-EPINEPHrine, [DISCONTINUED]  HYDROmorphone (DILAUDID) injection [DISCONTINUED] polymyxin / bacitracin (DOUBLE ANTIBIOTIC) irrigation, [DISCONTINUED] promethazine, [DISCONTINUED] sodium chloride irrigation  Assessment: 77 yo M who is S/P Left TKA Goal of Therapy:  INR 2-3 Monitor platelets by anticoagulation protocol: Yes   Plan:  1. Begin Coumadin 5mg  tonight x 1 2. Warfarin teaching book/video 3. PT/INR daily.  Loletta Specter 06/04/2012,6:05 PM

## 2012-06-04 NOTE — Transfer of Care (Signed)
Immediate Anesthesia Transfer of Care Note  Patient: Corey [name]  Procedure(s) Performed: Procedure(s): TOTAL KNEE ARTHROPLASTY (Left)  Patient Location: PACU  Anesthesia Type:General  Level of Consciousness: awake, sedated and patient cooperative  Airway & Oxygen Therapy: Patient Spontanous Breathing and Patient connected to face mask oxygen  Post-op Assessment: Report given to PACU RN, Post -op Vital signs reviewed and stable and Patient moving all extremities X 4  Post vital signs: Reviewed and stable  Complications: No apparent anesthesia complications

## 2012-06-04 NOTE — Brief Op Note (Signed)
06/04/2012  3:18 PM  PATIENT:  Corey [name]  77 y.o. male  PRE-OPERATIVE DIAGNOSIS:  LEFT KNEE DJD  POST-OPERATIVE DIAGNOSIS:  LEFT KNEE DJD  PROCEDURE:  Procedure(s): TOTAL KNEE ARTHROPLASTY (Left)  SURGEON:  Surgeon(s) and Role:    * Javier Docker, MD - Primary  PHYSICIAN ASSISTANT:   ASSISTANTS: Bissell   ANESTHESIA:   general  EBL:  Total I/O In: -  Out: 275 [Urine:275]  BLOOD ADMINISTERED:none  DRAINS: none   LOCAL MEDICATIONS USED:  MARCAINE     SPECIMEN:  No Specimen  DISPOSITION OF SPECIMEN:  N/A  COUNTS:  YES  TOURNIQUET:   Total Tourniquet Time Documented: Thigh (Left) - 90 minutes Total: Thigh (Left) - 90 minutes   DICTATION: .Other Dictation: Dictation Number [id number]  PLAN OF CARE: Admit to inpatient   PATIENT DISPOSITION:  PACU - hemodynamically stable.   Delay start of Pharmacological VTE agent (>24hrs) due to surgical blood loss or risk of bleeding: no

## 2012-06-04 NOTE — H&P (View-Only) (Signed)
Corey [name] DOB: 17-Oct-1935 Married / Language: English / Race: White Male  H&P date: 05/31/12  Chief Complaint: L knee pain  History of Present Illness The patient is a 77 year old male who comes in today for a preoperative History and Physical. The patient is scheduled for a left total knee arthroplasty to be performed by Dr. Javier Docker, MD at [facility] on 06/04/2012. Pt presents today with ongoing L knee pain x many years, progressively worsening. No new injury or change in activity. He had a prior football injury in college with a ligament tear that was reconstructed through a medial knee incision. He denies any difficulty in his recovery from that and actually had remained very active on this knee until a few years ago when he was forced to stop running. He has noted gradual worsening stiffness and pain. He has tried bracing the left knee without relief. Is unable to take NSAIDs as he is on coumadin for A.Fib but takes tylenol prn with no significant relief. Has had prior steroid injections with temporary relief, last injection L knee 2 months ago with zero relief. He does not have pain at rest but notes pain with most activity especially doing down hills or steps. He is in cardiac rehab and rides a bicycle often and works on strengthening exercises. He often notes grinding in the knee. Denies significant swelling. He is interested in discussing further tx options. He has progressively painful symptoms left knee now refractory to activity modification, bracing, steroid injections, tylenol, quad strengthening. At this point his arthritic changes are so severe that arthroscopic debridement or viscosupplementation would be of no benefit to him. Reasonable to proceed with TKA, and pt feels at this point he is ready to consider that.  Dr. Shelle Iron and the patient mutually agreed to proceed with a total knee replacement. Risks and benefits of the procedure were discussed  including stiffness, suboptimal range of motion, persistent pain, infection requiring removal of prosthesis and reinsertion, need for prophylactic antibiotics in the future, for example, dental procedures, possible need for manipulation, revision in the future and also anesthetic complications including DVT, PE, etc. We discussed the perioperative course, time in the hospital, postoperative recovery and the need for elevation to control swelling. We also discussed the predicted range of motion and the probability that squatting and kneeling would be unobtainable in the future. In addition, postoperative anticoagulation was discussed. We have obtained preoperative medical clearance as necessary (Dr. Birdie Sons, PCP & Dr. Alanda Amass, cardiologist). Provided the illustrated handout and discussed it in detail. They will enroll in the total joint replacement educational forum at the hospital.  He was directed to hold coumadin x 4-5 days pre-op and states he took his last dose 5/8.   Past MedicalHx Displacement, Lumbar Disc w/o myelopathy Spinal Stenosis, Lumbar  Osteoarthritis, Knee  Lumbar pain  Genu varum, acquired  Fx closed metacarpal bone NOS Prostate Cancer Sleep Apnea. uses CPAP Hypercholesterolemia Asthma Diverticulitis Of Colon Cardiac Arrhythmia . A.Fib, on coumadin High blood pressure Anxiety Disorder Depression Pneumonia Myocardial infarction. 1993 Pacemaker Gastroesophageal Reflux Disease Chronic fatigue syndrome Mumps Measles   Allergies Hydrocodone-Acetaminophen *ANALGESICS - OPIOID* Percocet *ANALGESICS - OPIOID*   Family History Heart Disease. father and brother Father. deceased from MI at 37 Siblings. brother deceased at 55, MI   Social History Tobacco use. former smoker, quit in 1966 Number of flights of stairs before winded. 4-5 Pain Contract. no Alcohol use. current drinker; drinks beer; only occasionally per  week Marital status.  married Current work status. working full time Drug/Alcohol Rehab (Currently). no Tobacco / smoke exposure. no Living situation. live with spouse with 14 steps to 2nd level of home where bedroom and full bath are located, 10 steps to front of house or 2 steps to back of house Drug/Alcohol Rehab (Previously). no Children. 4 Illicit drug use. no Exercise. Exercises weekly; does running / walking Advance Directives. living will, POA Post-Surgical Plans. likely short term rehab stay, wife will be going out of country 9 days post-op   Medication History Advair Diskus (500-50MCG/DOSE Aero Pow Br Act, Inhalation) Active. Ambien (5MG  Tablet, Oral) Active. Aspirin EC (81MG  Tablet DR, Oral) Active. Toprol XL (25MG  Tablet ER 24HR, Oral) Active. Pantoprazole Sodium (40MG  Tablet DR, Oral) Active. Xopenex (1.25MG /3ML Nebulized Soln, Inhalation) Active. Crestor (5MG  Tablet, Oral) Active. Tylenol (325MG  Tablet, 1 (one) Oral) Active. Pacerone (200MG  Tablet, Oral) Active. Coumadin (2MG  Tablet, Oral) Active. Nasonex (50MCG/ACT Suspension, Nasal) Active. MiraLax ( Oral) Active. Ramipril (10MG  Capsule, Oral) Active. Fluticasone Propionate (50MCG/ACT Suspension, Nasal) Active.   Past Surgical History Prostatectomy; Transurethral. for CA Tonsillectomy Other Orthopaedic Surgery. right knee wedge osteotomy Arthroscopic Knee Surgery - Right Spinal Decompression - Lower Back   Review of Systems General:Present- Memory Loss (slight). Not Present- Chills, Fever, Night Sweats, Fatigue, Weight Gain and Weight Loss. Skin:Not Present- Hives, Itching, Rash, Eczema and Lesions. HEENT:Present- Tinnitus. Not Present- Headache, Double Vision, Visual Loss, Hearing Loss and Dentures. Respiratory:Present- Allergies and Chronic Cough. Not Present- Shortness of breath with exertion, Shortness of breath at rest and Coughing up blood. Cardiovascular:Not Present- Chest Pain, Racing/skipping  heartbeats, Difficulty Breathing Lying Down, Murmur, Swelling and Palpitations. Gastrointestinal:Present- Constipation. Not Present- Bloody Stool, Heartburn, Abdominal Pain, Vomiting, Nausea, Diarrhea, Difficulty Swallowing, Jaundice and Loss of appetitie. Male Genitourinary:Present- Urinating at Night. Not Present- Urinary frequency, Blood in Urine, Weak urinary stream, Discharge, Flank Pain, Incontinence, Painful Urination, Urgency and Urinary Retention. Musculoskeletal:Present- Joint Pain and Back Pain. Not Present- Muscle Weakness, Muscle Pain, Joint Swelling, Morning Stiffness and Spasms. Neurological:Not Present- Tremor, Dizziness, Blackout spells, Paralysis, Difficulty with balance and Weakness. Psychiatric:Present- Insomnia.   Vitals 05/31/2012 1:50 PM BP: 127/68 (Sitting, Left Arm, Standard) Weight: 182 lb Height: 72.5 in Body Surface Area: 2.05 m Body Mass Index: 24.34 kg/m   Physical Exam The physical exam findings are as follows:  General Mental Status - Alert, cooperative and good historian. General Appearance- pleasant. Not in acute distress. Orientation- Oriented X3. Build & Nutrition- Well nourished and Well developed.  Head and Neck Head- normocephalic, atraumatic . Neck Global Assessment- supple. no bruit auscultated on the right and no bruit auscultated on the left.  Eye Pupil- Bilateral- Regular and Round. Motion- Bilateral- EOMI.  Chest and Lung Exam Auscultation: Breath sounds:- clear at anterior chest wall and - clear at posterior chest wall. Adventitious sounds:- No Adventitious sounds.  Cardiovascular Auscultation:Rhythm- Regular rate and rhythm. Heart Sounds- S1 WNL and S2 WNL. Murmurs & Other Heart Sounds:Auscultation of the heart reveals - No Murmurs.  Abdomen Palpation/Percussion:Tenderness- Abdomen is non-tender to palpation. Rigidity (guarding)- Abdomen is soft. Auscultation:Auscultation of the abdomen  reveals - Bowel sounds normal.  Male Genitourinary Not done, not pertinent to present illness  Musculoskeletal Left Knee: Inspection and Palpation: Tenderness - medial joint line tender to palpation. no tenderness to palpation of the superior calf, no tenderness to palpation of the quadriceps tendon, no tenderness to palpation of the patellar tendon, no tenderness to palpation of the lateral joint line, no tenderness to palpation of  the fibular head and no tenderness to palpation of the peroneal nerve. Crepitus - mild. Effusion - trace. Tissue tension/texture is - soft. Muscle tone - no quadriceps atrophy noted. Pulses - 2+. Sensation - intact to light touch. Alignment: Tibiofemoral Alignment - varus alignment. Skin: Color - no ecchymosis and no erythema. Temperature - normal warmth. Strength and Tone: Quadriceps - 5/5. Hamstrings - 5/5. ROM: - Full ROM of the knee and - pain-free. Instability - Valgus Laxity at 30 - None. Valgus Laxity at 0 - None. Varus Laxity at 30 - None. Varus Laxity at 0 - None. Lachman - Negative. Anterior Drawer Test - Negative. Posterior Drawer Test - Negative. Deformities/Malalignments/Discrepancies - no deformities noted. Special Tests: McMurray Test (lateral) - negative. McMurray Test (medial) - negative. Patellar Compression Pain - mild pain. Left Ankle: ROM: - Full and pain-free ROM of the ankle. Gait and Station: Evaluation of Gait: Gait Pattern - antalgic. Abnormal Gait Patterns - Note: slight varus thrust left knee  Lymphatic General Lymphatics Description - No Localized lymphadenopathy.   left knee xrays from 2/14 reviewed by Dr. Shelle Iron with severe, end-stage tricompartmental DJD left knee, with bone-on-bone changes medial compartment. Varus deformity. No fx, subluxation, dislocation, lytic or blastic lesions.  Assessment & Plan Osteoarthritis, Knee, left  Pt with L knee severe DJD, refractory to conservative tx as listed above, scheduled for L  TKA by Dr Shelle Iron 06/04/12. Again discussed risks, complications, and alternatives including but not limited to DVT, PE, infx, bleeding, failure of procedure, need for secondary procedure, scarring, nerve injury, anesthesia risk, even death. Discussed hospital course and post-op course. All questions answered and pt desires to proceed. Have obtained clearance from Dr Cato Mulligan and Dr Alanda Amass. Pt is already holding his coumadin starting 5/8. Pre-op INR supratherapeutic, will redraw AM of surgery. NPO after MN night prior to surgery. His wife will be away out of the country 9 days post-op so we discussed likely rehab stay to continue to work on PT. Will plan to bridge with lovenox post-op and resume coumadin to therapeutic level prior to D/C. He will follow up 10-14 days post-op for xrays and staple removal. Reviewed importance of post-op ROM exercises to regain full function and ice and elevation to keep swelling down.    Signed electronically by Dorothy Spark PA-C for Dr. Shelle Iron

## 2012-06-05 ENCOUNTER — Encounter (HOSPITAL_COMMUNITY): Payer: Self-pay | Admitting: Specialist

## 2012-06-05 LAB — CBC
HCT: 34 % — ABNORMAL LOW (ref 39.0–52.0)
MCV: 85.2 fL (ref 78.0–100.0)
RBC: 3.99 MIL/uL — ABNORMAL LOW (ref 4.22–5.81)
RDW: 14.5 % (ref 11.5–15.5)
WBC: 9.6 10*3/uL (ref 4.0–10.5)

## 2012-06-05 LAB — BASIC METABOLIC PANEL
BUN: 16 mg/dL (ref 6–23)
CO2: 25 mEq/L (ref 19–32)
Chloride: 102 mEq/L (ref 96–112)
Creatinine, Ser: 0.92 mg/dL (ref 0.50–1.35)
GFR calc Af Amer: >90 mL/min (ref >90)
Potassium: 4.3 mEq/L (ref 3.5–5.1)

## 2012-06-05 LAB — PROTIME-INR: Prothrombin Time: 17.7 seconds — ABNORMAL HIGH (ref 11.6–15.2)

## 2012-06-05 MED ORDER — WARFARIN SODIUM 3 MG PO TABS
3.0000 mg | ORAL_TABLET | Freq: Once | ORAL | Status: AC
  Administered 2012-06-05: 3 mg via ORAL
  Filled 2012-06-05: qty 1

## 2012-06-05 NOTE — Progress Notes (Addendum)
ANTICOAGULATION CONSULT NOTE - Follow Up Consult  Pharmacy Consult for Warfarin Indication: h/o afib, VTE prophylaxis s/p L TKA  Allergies  Allergen Reactions  . Hydrocodone Other (See Comments)    Severe panic attacks  . Oxycodone Other (See Comments)    Severe panic attacks    Patient Measurements: Height: 6\' 1"  (185.4 cm) Weight: 191 lb (86.637 kg) IBW/kg (Calculated) : 79.9  Labs:  Recent Labs  06/04/12 1135 06/04/12 1802 06/05/12 0353  HGB  --  12.7* 11.7*  HCT  --  37.8* 34.0*  PLT  --  210 218  LABPROT 19.1*  --  17.7*  INR 1.66*  --  1.50*  CREATININE  --  1.06 0.92    Estimated Creatinine Clearance: 79.6 ml/min (by C-G formula based on Cr of 0.92).   Assessment: 55 yom with h/o afib on Coumadin PTA admitted for L TKA on 5/12. MD ordered to resume Coumadin post-op along with Lovenox 30 mg sq q24h until INR >/= 1.8.   PTA: Warfarin 2.5mg  daily except 5mg  on Mondays, LD 5/8.   Coumadin 5mg  po x 1 ordered last night not given since it was scheduled for 5/13 in error.   Amiodarone from PTA resumed  Goal of Therapy:  INR 2-3 Monitor platelets by anticoagulation protocol: Yes   Plan:   Coumadin 3 mg po x 1 now  Daily PT/INR  Continue Lovenox 30mg  sq q24h until INR >/= 1.8  Geoffry Paradise, PharmD, BCPS Pager: [phone] 8:53 AM Pharmacy #: [id number]

## 2012-06-05 NOTE — Progress Notes (Signed)
Called to patient's bedside.  Pt questioned the need for oxygen bleedin through his home cpap machine.  Pt stated he has been off/on o2/[place] today.  Pt currently is on room air with sats 95-96%.  Supplemental o2 not indicated at this time, therefore o2 bleedin removed from cpap machine.  Pt stated he has used cpap for about 20 years and feels comfortable placing it on himself when ready.  The humidifier chamber noted already full with water.  No defects or frays on cord noted with patient's machine.  Pt advised that RT available all night should he need further assistance.  RN notified.

## 2012-06-05 NOTE — Op Note (Signed)
NAME:  Corey [name], Corey [name] NO.:  [id number]  MEDICAL RECORD NO.:  [id number]  LOCATION:  1605                         FACILITY:  [facility]  PHYSICIAN:  Jene Every, M.D.    DATE OF BIRTH:  Oct 05, 1935  DATE OF PROCEDURE:  06/04/2012 DATE OF DISCHARGE:                              OPERATIVE REPORT   PREOPERATIVE DIAGNOSIS:  Degenerative joint disease, left knee.  POSTOPERATIVE DIAGNOSIS:  Degenerative joint disease, left knee.  PROCEDURE PERFORMED:  Left total knee arthroplasty.  ANESTHESIA:  General.  ASSISTANT:  Lanna Poche, PA-C.  COMPONENTS UTILIZED:  DePuy rotating platform, 5 femur, 5 tibia, 10 mm insert, 41 patella.  HISTORY:  This is a pleasant 77 year old gentleman who has had end-stage osteoarthrosis of the medial compartment of the left knee, refractory conservative treatment, bone-on-bone arthritis, disabling pain, giving way, locking despite rest, activity modification, corticosteroid injection, physical therapy, using assistive devices.  He was indicated for replacement of the degenerated joint by radiographs indicating bone- on-bone, improvement with activity daily living and diminish his pain. Risks and benefits discussed including bleeding, infection, damage to neurovascular structures, DVT, PE, anesthetic complications, etc.  TECHNIQUE:  With the patient in supine position, after induction of adequate anesthesia and 2 g Kefzol, left lower extremity was prepped, draped, and exsanguinated in the usual sterile fashion.  Thigh tourniquet inflated to 300 mmHg.  Midline incision was made over the knee.  Full-thickness flaps were developed.  Median parapatellar arthrotomy was performed.  Patella was everted.  The knee was flexed, elevated the soft tissues medially, preserving the MCL. Tricompartmental osteoarthrosis.  Bone on bone was needed.  It was noted.  Remnants of medial and lateral menisci were removed as was the ACL and osteophytes with  a rongeur.  Step drill utilized to enter the femoral canal, was irrigated 5 degree left was utilized to 11 off the distal femur due to the flexion contracture.  This cut was then made with an oscillating saw protecting the soft tissues.  We referenced off the anterior cortex.  He had a fairly high lateral femoral condyle.  It was sized to a 5, pinned it in 3 degrees of external rotation. Performed our anterior-posterior and chamfer cuts without difficulty. Attention was turned towards the tibia which was subluxed within the talus and external alignment guide was utilized.  The reference 4 off the low side, which was medial, bisecting the tibiotalar joint in line with the first and second ray.  Two degrees slope parallel to the tibial axis.  A long shaft of the tibia.  This was then pinned and an oscillating saw performed that cut.  Flexion-extension gaps were evaluated, were found to be equivalent.  We turned our attention towards completing the tibia.  We maximized the coverage of the tibial plateau medially with a 5 distal medial aspect of the tibial tubercle.  This was pinned, drilled, and a temporary tibial component in place.  We then completed the femur with a box cut bisecting the intramedullary canal. We performed the box cut.  We placed a trial tibia and femur, 10 mm insert.  Full extension and full flexion, good stability of varus valgus stress in 0 and 30 degrees.  We completed then the patella.  This actually measured near 28.  Again, a high lateral facet, and was planed to a 17.  We used a 41 mm button.  We medialized the PEG holes which were drilled.  A trial patella was placed and there was excellent patellofemoral tracking.  We then removed all instrumentation.  With pulsatile lavage the cancellous surfaces.  We checked posteriorly.  We then cauterized the geniculate and removed any remnants of menisci and of the PCL.  Knee was flexed, all surfaces dried thoroughly,  mixed cement on the back table.  They were then injected into the tibial canal.  I digitally pressurized the tibia.  I then impacted the tibial tray.  The room was fairly warm and the cement was setting up rather quickly.  We went to cement the femur and initially was slightly flexed and I felt that the cement was beyond the portion which was appropriate for viscosity and then removed this.  A permanent prosthesis clean the cement from them and I placed a trial femur and 10 mm insert reduced it, and it was allowed to cure.  Following that, I removed the trial. Flexed the knee, dried the surfaces, mixed additional batch of cement, and then cemented the femur in the permanent insert, it was then impacted without difficulty.  Reduced it with a 10 mm insert, and held in extension throughout the curing of the cement.  The patellar component was cemented as well.  After appropriate curing of the cement, we had full extension, full flexion, good stability, varus valgus stressing 0-30 degrees.  An excellent patellofemoral tracking.  We removed the inserter, copiously irrigated the wound with pulsatile lavage and antibiotics.  Removed redundant cement, placed a 10-mm permanent insert.  Again full extension, full flexion, and good stability of varus valgus stressing at 0 and 30 degrees.  Excellent patellofemoral tracking.  We placed a Hemovac and brought out through a lateral stab wound on the skin.  We anesthetized the periosteum with 0.25% Marcaine and Exparel to the para muscular tissues in the periosteum.  Next, the knee was slightly flexed, we reapproximated the patellar arthrotomy with 1 Vicryl interrupted figure-of-8 sutures, and then a running V-Loc.  Subcu with 2-0, the skin was reapproximated with staples.  Wound was dressed sterilely.  He had flexion the gravity against flexion 90 degrees against gravity.  He had an excellent patellofemoral tracking and good stability.  The wound was  dressed sterilely.  Tourniquet was deflated and there was adequate revascularization of lower extremity appreciated.  The patient tolerated the procedure well.  No complications.  The assistant was Lanna Poche, [place].     Jene Every, M.D.     Cordelia Pen  D:  06/04/2012  T:  06/05/2012  Job:  [id number]

## 2012-06-05 NOTE — Progress Notes (Signed)
Clinical Social Work Department BRIEF PSYCHOSOCIAL ASSESSMENT 06/05/2012  Patient:  Corey [name], Corey [name]     Account Number:  [id number]     Admit date:  06/04/2012  Clinical Social Worker:  Candie Chroman  Date/Time:  06/05/2012 10:53 AM  Referred by:  Physician  Date Referred:  06/05/2012 Referred for  SNF Placement   Other Referral:   Interview type:  Patient Other interview type:    PSYCHOSOCIAL DATA Living Status:  WIFE Admitted from facility:   Level of care:   Primary support name:  Anda Kraft Primary support relationship to patient:  SPOUSE Degree of support available:   limited support    CURRENT CONCERNS Current Concerns  Post-Acute Placement   Other Concerns:    SOCIAL WORK ASSESSMENT / PLAN Pt is a 77 yr old gentleman living at home prior to hospitalization. CSW met with pt to assist with d/c planning. ST Rehab will be needed following hospital d/c. CSW has initiated SNF search and will provide bed offers as received. CSW will continue to follow pt to assist with d/c planning to SNF.   Assessment/plan status:  Psychosocial Support/Ongoing Assessment of Needs Other assessment/ plan:   Information/referral to community resources:   SNF list with bed to be provided.    PATIENT'S/FAMILY'S RESPONSE TO PLAN OF CARE: Pt would like to have rehab at either [facility], [facility] or [facility].   Cori Razor LCSW [phone]

## 2012-06-05 NOTE — Care Management Note (Signed)
    Page 1 of 1   06/05/2012     5:02:55 PM   CARE MANAGEMENT NOTE 06/05/2012  Patient:  NYAIR, DEPAULO   Account Number:  [id number]  Date Initiated:  06/05/2012  Documentation initiated by:  Colleen Can  Subjective/Objective Assessment:   dx left knee DJD; total knee replacemnt     Action/Plan:   CM spoke with patient. Plans are for snf rehab upon discharge.   Anticipated DC Date:  06/07/2012   Anticipated DC Plan:  SKILLED NURSING FACILITY  In-house referral  Clinical Social Worker      DC Planning Services  CM consult      Choice offered to / List presented to:             Status of service:  Completed, signed off Medicare Important Message given?  NA - LOS <3 / Initial given by admissions (If response is "NO", the following Medicare IM given date fields will be blank) Date Medicare IM given:   Date Additional Medicare IM given:    Discharge Disposition:    Per UR Regulation:    If discussed at Long Length of Stay Meetings, dates discussed:    Comments:

## 2012-06-05 NOTE — Evaluation (Signed)
Occupational Therapy Evaluation Patient Details Name: Corey [name] MRN: [id number] DOB: Apr 13, 1935 Today's Date: 06/05/2012 Time: 3662-9476 OT Time Calculation (min): 16 min  OT Assessment / Plan / Recommendation Clinical Impression  This 77 year old male was admitted for L TKA.  Prior to sx, he was independent with all adls.  He is planning snf for rehab and wil benefit from skilled OT in acute to increase safety with adls and bathroom transfers.      OT Assessment  Patient needs continued OT Services    Follow Up Recommendations  SNF    Barriers to Discharge      Equipment Recommendations   (likely none, depending on commode height)    Recommendations for Other Services    Frequency  Min 2X/week    Precautions / Restrictions Precautions Precautions: Knee;Fall Precaution Booklet Issued: Yes (comment) Precaution Comments: safety Required Braces or Orthoses: Knee Immobilizer - Left Knee Immobilizer - Left: Discontinue once straight leg raise with < 10 degree lag Restrictions Weight Bearing Restrictions: No   Pertinent Vitals/Pain L knee "OK".  Repositioned     ADL  Grooming: Wash/dry hands;Teeth care;Min guard (min cues for safety to turn body and walker to sink) Where Assessed - Grooming: Supported standing Upper Body Bathing: Set up Where Assessed - Upper Body Bathing: Unsupported sitting Lower Body Bathing: Minimal assistance Where Assessed - Lower Body Bathing: Supported sit to stand Upper Body Dressing: Minimal assistance Where Assessed - Upper Body Dressing: Unsupported sitting Lower Body Dressing: Minimal assistance Where Assessed - Lower Body Dressing: Supported sit to stand Toilet Transfer: Minimal assistance Toilet Transfer Method: Sit to Barista: Bedside commode Toileting - Architect and Hygiene: Min guard Where Assessed - Engineer, mining and Hygiene: Sit to stand from 3-in-1 or toilet Equipment  Used: Rolling walker Transfers/Ambulation Related to ADLs: Cues for sit to stand and safety with positioning walker.  Pt tends to move quickly ADL Comments: Pt is able to reach to L ankle for adls    OT Diagnosis: Generalized weakness  OT Problem List: Decreased strength;Decreased safety awareness;Decreased knowledge of use of DME or AE;Pain OT Treatment Interventions: Self-care/ADL training;DME and/or AE instruction;Patient/family education   OT Goals Acute Rehab OT Goals OT Goal Formulation: With patient Time For Goal Achievement: 06/12/12 Potential to Achieve Goals: Good ADL Goals Pt Will Transfer to Toilet: with supervision;Ambulation;3-in-1 ADL Goal: Toilet Transfer - Progress: Goal set today Miscellaneous OT Goals Miscellaneous OT Goal #1: Pt will demonstrate good safety awareness demonstrated by no cues given for positioning himself at sink with RW and waiting for assistance prior to standing OT Goal: Miscellaneous Goal #1 - Progress: Goal set today  Visit Information  Last OT Received On: 06/05/12 Assistance Needed: +1    Subjective Data  Subjective: I'm not getting up:  I'm just moving to get more comfortable Patient Stated Goal: none stated.  Agreeable to OT   Prior Functioning     Home Living Lives With: Spouse Available Help at Discharge: Skilled Nursing Facility Type of Home: House Home Access: Stairs to enter Secretary/administrator of Steps: 1 Entrance Stairs-Rails: Right Home Layout: One level Bathroom Shower/Tub: Engineer, manufacturing systems: Handicapped height Home Adaptive Equipment: Straight cane Prior Function Level of Independence: Independent Able to Take Stairs?: Yes Driving: Yes Communication Communication: No difficulties         Vision/Perception     Cognition  Cognition Arousal/Alertness: Awake/alert Behavior During Therapy: WFL for tasks assessed/performed Overall Cognitive Status: Impaired/Different from  baseline Area of  Impairment: Safety/judgement Safety/Judgement: Decreased awareness of safety General Comments: moves quickly; cues for safety     Extremity/Trunk Assessment Right Upper Extremity Assessment RUE ROM/Strength/Tone: [facility] for tasks assessed Left Upper Extremity Assessment LUE ROM/Strength/Tone: WFL for tasks assessed     Mobility  Transfers Sit to Stand: 4: Min guard;From chair/3-in-1;With armrests Stand to Sit: To chair/3-in-1;With armrests;4: Min guard Details for Transfer Assistance: cues for ue/le positions     Exercise    Balance     End of Session OT - End of Session Activity Tolerance: Patient tolerated treatment well Patient left: in chair;with call bell/phone within reach  Corey [name]     Corey [name] 06/05/2012, 1:41 PM Marica Otter, OTR/L [phone] 06/05/2012

## 2012-06-05 NOTE — Evaluation (Addendum)
Physical Therapy Evaluation Patient Details Name: PADEN SENGER MRN: [id number] DOB: 1935/06/27 Today's Date: 06/05/2012 Time: 4098-1191 PT Time Calculation (min): 25 min  PT Assessment / Plan / Recommendation Clinical Impression  77 yo male admitted 06/04/12 for LTKA. Pt ambulated x 150 ' . Pt plans to DC to SNF as wife has an injury. Pt will benefit from pT while in acute care to improve ROM, strength and safety.     PT Assessment  Patient needs continued PT services    Follow Up Recommendations  SNF    Does the patient have the potential to tolerate intense rehabilitation      Barriers to Discharge Decreased caregiver support      Equipment Recommendations  Rolling walker with 5" wheels    Recommendations for Other Services     Frequency 7X/week    Precautions / Restrictions Precautions Precautions: Knee;Fall Precaution Comments: safety Required Braces or Orthoses: Knee Immobilizer - Left Knee Immobilizer - Left: Discontinue once straight leg raise with < 10 degree lag   Pertinent Vitals/Pain 5 premedicated.l knee      Mobility  Bed Mobility Bed Mobility: Supine to Sit;Sit to Supine Supine to Sit: 5: Supervision;HOB elevated Details for Bed Mobility Assistance: pt required no asssitance  able to lift  Lleg and move down to floor. Transfers Transfers: Sit to Stand;Stand to Sit Sit to Stand: 4: Min assist;From bed;With upper extremity assist Stand to Sit: To chair/3-in-1;With armrests;4: Min guard Details for Transfer Assistance: cues for hand placement and LLE position Ambulation/Gait Ambulation/Gait Assistance: 4: Min assist Ambulation Distance (Feet): 150 Feet Assistive device: Rolling walker Ambulation/Gait Assistance Details: cues for posture, pt began to flex trunk as he ambulated farther. cues for sequence and to slow down. Gait Pattern: Step-through pattern;Decreased stance time - left;Trunk flexed    Exercises Total Joint Exercises Heel Slides:  AROM;Left;10 reps;Supine Straight Leg Raises: AROM;Left;10 reps;Supine   PT Diagnosis: Difficulty walking;Acute pain  PT Problem List: Decreased strength;Decreased range of motion;Decreased activity tolerance;Decreased mobility;Pain;Decreased knowledge of use of DME;Decreased safety awareness;Decreased knowledge of precautions PT Treatment Interventions: DME instruction;Gait training;Functional mobility training;Therapeutic activities;Therapeutic exercise;Patient/family education   PT Goals Acute Rehab PT Goals Time For Goal Achievement: 06/12/12 Potential to Achieve Goals: Good Pt will move supine to sit with supervision Goal set today Pt will sit to stand to sit with supervision Goal set today  pt will ambulate > 150 ft with supervision and RW  Goal set today. Pt will perform exercises with supervision Goal set today.  Visit Information  Last PT Received On: 06/05/12 Assistance Needed: +1    Subjective Data  Subjective: I need to go to rehab. My wife hurt her knee Patient Stated Goal: to go to rehab   Prior Functioning  Home Living Lives With: Spouse Available Help at Discharge: Skilled Nursing Facility Type of Home: House Home Adaptive Equipment: Straight cane Prior Function Level of Independence: Independent Able to Take Stairs?: Yes Communication Communication: No difficulties    Cognition  Cognition Arousal/Alertness: Awake/alert Behavior During Therapy: WFL for tasks assessed/performed Overall Cognitive Status: Impaired/Different from baseline Area of Impairment: Safety/judgement Safety/Judgement: Decreased awareness of safety General Comments: has gotten himself to edge of bed everal times.    Extremity/Trunk Assessment Right Lower Extremity Assessment RLE ROM/Strength/Tone: WFL for tasks assessed RLE Sensation: WFL - Light Touch Left Lower Extremity Assessment LLE ROM/Strength/Tone: Deficits LLE ROM/Strength/Tone Deficits: able to perform SLR. knee flex  to 40 LLE Sensation: WFL - Light Touch   Balance  End of Session PT - End of Session Equipment Utilized During Treatment: Left knee immobilizer Activity Tolerance: Patient tolerated treatment well Patient left: in chair;with call bell/phone within reach Nurse Communication: Mobility status  GP     Rada Hay 06/05/2012, 12:00 PM

## 2012-06-05 NOTE — Progress Notes (Signed)
Patient ID: Corey [name], male   DOB: 07-18-1935, 77 y.o.   MRN: [id number] Subjective: 1 Day Post-Op Procedure(s) (LRB): TOTAL KNEE ARTHROPLASTY (Left) Patient reports pain as mild.    Patient has complaints of mild L knee pain, well controlled. No other c/o  We will start therapy today. Plan is to go home with HHPT vs. Skilled rehab after hospital stay.  Objective: Vital signs in last 24 hours: Temp:  [97.3 F (36.3 C)-98.9 F (37.2 C)] 97.7 F (36.5 C) (05/13 0544) Pulse Rate:  [64-73] 71 (05/13 0544) Resp:  [12-24] 19 (05/12 2045) BP: (108-164)/(67-98) 108/67 mmHg (05/13 0544) SpO2:  [95 %-100 %] 99 % (05/13 0544) Weight:  [86.637 kg (191 lb)] 86.637 kg (191 lb) (05/12 1745)  Intake/Output from previous day:  Intake/Output Summary (Last 24 hours) at 06/05/12 0739 Last data filed at 06/05/12 0549  Gross per 24 hour  Intake 2390.84 ml  Output   1720 ml  Net 670.84 ml    Intake/Output this shift:    Labs: Results for orders placed during the hospital encounter of 06/04/12  PROTIME-INR      Result Value Range   Prothrombin Time 19.1 (*) 11.6 - 15.2 seconds   INR 1.66 (*) 0.00 - 1.49  CBC      Result Value Range   WBC 9.6  4.0 - 10.5 K/uL   RBC 3.99 (*) 4.22 - 5.81 MIL/uL   Hemoglobin 11.7 (*) 13.0 - 17.0 g/dL   HCT 95.2 (*) 84.1 - 32.4 %   MCV 85.2  78.0 - 100.0 fL   MCH 29.3  26.0 - 34.0 pg   MCHC 34.4  30.0 - 36.0 g/dL   RDW 40.1  02.7 - 25.3 %   Platelets 218  150 - 400 K/uL  BASIC METABOLIC PANEL      Result Value Range   Sodium 135  135 - 145 mEq/L   Potassium 4.3  3.5 - 5.1 mEq/L   Chloride 102  96 - 112 mEq/L   CO2 25  19 - 32 mEq/L   Glucose, Bld 122 (*) 70 - 99 mg/dL   BUN 16  6 - 23 mg/dL   Creatinine, Ser 6.64  0.50 - 1.35 mg/dL   Calcium 8.3 (*) 8.4 - 10.5 mg/dL   GFR calc non Af Amer 81 (*) >90 mL/min   GFR calc Af Amer >90  >90 mL/min  PROTIME-INR      Result Value Range   Prothrombin Time 17.7 (*) 11.6 - 15.2 seconds   INR 1.50 (*)  0.00 - 1.49  CBC      Result Value Range   WBC 7.6  4.0 - 10.5 K/uL   RBC 4.41  4.22 - 5.81 MIL/uL   Hemoglobin 12.7 (*) 13.0 - 17.0 g/dL   HCT 40.3 (*) 47.4 - 25.9 %   MCV 85.7  78.0 - 100.0 fL   MCH 28.8  26.0 - 34.0 pg   MCHC 33.6  30.0 - 36.0 g/dL   RDW 56.3  87.5 - 64.3 %   Platelets 210  150 - 400 K/uL  CREATININE, SERUM      Result Value Range   Creatinine, Ser 1.06  0.50 - 1.35 mg/dL   GFR calc non Af Amer 67 (*) >90 mL/min   GFR calc Af Amer 78 (*) >90 mL/min    Exam - Neurologically intact ABD soft Neurovascular intact Sensation intact distally Intact pulses distally Dorsiflexion/Plantar flexion intact Incision: dressing C/D/I and  no drainage No cellulitis present Compartment soft Dressing - clean, dry, no drainage Motor function intact - moving foot and toes well on exam. No calf pain or sign of DVT Hemovac pulled without difficulty.  Assessment/Plan: 1 Day Post-Op Procedure(s) (LRB): TOTAL KNEE ARTHROPLASTY (Left)  Advance diet Up with therapy D/C IV fluids Past Medical History  Diagnosis Date  . Alcohol abuse, episodic 05/19/2008  . ALLERGIC RHINITIS 04/15/2009  . COLONIC POLYPS, ADENOMATOUS, HX OF 02/14/2008  . DIVERTICULOSIS, COLON 02/14/2008  . ESOPHAGEAL STRICTURE 01/28/2008  . GERD 02/14/2008  . HYPERGLYCEMIA 11/16/2006  . HYPERLIPIDEMIA 02/14/2008  . INSOMNIA-SLEEP DISORDER-UNSPEC 02/11/2009  . 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 PROSTATE CANCER, HX OF 02/25/2000  . TRANSIENT ISCHEMIC ATTACKS, HX OF 11/16/2006  . Arthritis   . Atrial fibrillation 11/16/2006    remote CHF related to atrial fib with rapid ventricular response over 25 yrs per office note,  . ASTHMA 11/16/2006    sinusitis-    hx ?yeast patch/white patch on vocal cord as per [facility] 02/25/11-   . OBSTRUCTIVE SLEEP APNEA 11/10/2008  . SLEEP APNEA 10/03/2009    LOV Dr Maple Hudson 12/12 in EPIC    Moderate per patient- settings ?6/last sleep study years ago  . HYPERTENSION 11/16/2006  .  CORONARY ARTERY DISEASE 11/16/2006    nonischemic cardiomyapathy, last stress test 2011- LOV with clearance note Dr  Alanda Amass and EKG 1/13 on chart  . Symptomatic bradycardia, secondary to sinus node dysfunction 07/08/2011    s/p [facility] Scientific PPM by Dr Royann Shivers, upgraded to BiV ICD 12/13 by Dr Johney Frame ([facility])  . Myocardial infarction 1993  . Pacemaker     St. Jude  . ICD (implantable cardiac defibrillator) in place   . Anxiety   . Depression   . Pneumonia     hx of  . Headache   . Cancer 2002    prostate cancer    DVT Prophylaxis - Resume coumadin, bridge with Lovenox until therapeutic Weight-Bearing as tolerated to left leg No vaccines.  BISSELL, JACLYN M. 06/05/2012, 7:39 AM

## 2012-06-05 NOTE — Progress Notes (Signed)
OT Cancellation Note  Patient Details Name: Corey [name] MRN: [id number] DOB: Nov 18, 1935   Cancelled Treatment:     Pt plans snf for rehab.  Will defer OT to that venue.    [address] OTR/L [phone] 06/05/2012 06/05/2012, 12:08 PM

## 2012-06-05 NOTE — Progress Notes (Signed)
Clinical Social Work Department CLINICAL SOCIAL WORK PLACEMENT NOTE 06/05/2012  Patient:  Corey [name], Corey [name]  Account Number:  [id number] Admit date:  06/04/2012  Clinical Social Worker:  Cori Razor, LCSW  Date/time:  06/05/2012 11:07 AM  Clinical Social Work is seeking post-discharge placement for this patient at the following level of care:   SKILLED NURSING   (*CSW will update this form in Epic as items are completed)     Patient/family provided with [facility] Department of Clinical Social Work's list of facilities offering this level of care within the geographic area requested by the patient (or if unable, by the patient's family).  06/05/2012  Patient/family informed of their freedom to choose among providers that offer the needed level of care, that participate in Medicare, Medicaid or managed care program needed by the patient, have an available bed and are willing to accept the patient.    Patient/family informed of [facility]' ownership interest in [facility], as well as of the fact that they are under no obligation to receive care at this facility.  PASARR submitted to EDS on 06/04/2012 PASARR number received from EDS on 06/04/2012  FL2 transmitted to all facilities in geographic area requested by pt/family on  06/05/2012 FL2 transmitted to all facilities within larger geographic area on   Patient informed that his/her managed care company has contracts with or will negotiate with  certain facilities, including the following:     Patient/family informed of bed offers received:   Patient chooses bed at  Physician recommends and patient chooses bed at    Patient to be transferred to  on   Patient to be transferred to facility by   The following physician request were entered in Epic:   Additional Comments:  Cori Razor LCSW [phone]

## 2012-06-05 NOTE — Progress Notes (Signed)
Utilization review completed.  

## 2012-06-05 NOTE — Progress Notes (Signed)
Physical Therapy Treatment Patient Details Name: Corey [name] MRN: [id number] DOB: 01-Aug-1935 Today's Date: 06/05/2012 Time: 8295-6213 PT Time Calculation (min): 13 min  PT Assessment / Plan / Recommendation Comments on Treatment Session  Pt. continues with decreased safety awareness, requires frequent reinforcement for use of RW when getting up and that he must call for assistance.    Follow Up Recommendations  SNF     Does the patient have the potential to tolerate intense rehabilitation     Barriers to Discharge Decreased caregiver support      Equipment Recommendations  Rolling walker with 5" wheels    Recommendations for Other Services    Frequency 7X/week   Plan Discharge plan remains appropriate    Precautions / Restrictions Precautions Precautions: Fall Precaution Booklet Issued: No Precaution Comments: safety, decr. judgement., pt stated  he coul get up without the RW. Required Braces or Orthoses: Knee Immobilizer - Left Knee Immobilizer - Left: Discontinue once straight leg raise with < 10 degree lag Restrictions Weight Bearing Restrictions: No   Pertinent Vitals/Pain     Mobility  Bed Mobility Bed Mobility: Supine to Sit;Sit to Supine Supine to Sit: 5: Supervision;HOB elevated Sit to Supine: 4: Min guard Details for Bed Mobility Assistance: pt required no assistance  able to lift  Lleg and move down to floor. Transfers Transfers: Sit to Stand;Stand to Sit Sit to Stand: From chair/3-in-1;With armrests;4: Min assist Stand to Sit: To bed;With upper extremity assist;4: Min guard Details for Transfer Assistance: cues for safety and that he must have RW to stand. Ambulation/Gait Ambulation/Gait Assistance: 4: Min assist Ambulation Distance (Feet): 5 Feet Assistive device: Rolling walker Ambulation/Gait Assistance Details: cues for safe use of RW, back up to bed and reach back. Gait Pattern: Step-through pattern;Decreased stance time - left;Trunk flexed    Exercises Total Joint Exercises Heel Slides: AROM;Left;10 reps;Supine Straight Leg Raises: AROM;Left;10 reps;Supine Knee Flexion: AAROM;Left;15 reps;Seated   PT Diagnosis: Difficulty walking;Acute pain  PT Problem List: Decreased strength;Decreased range of motion;Decreased activity tolerance;Decreased mobility;Pain;Decreased knowledge of use of DME;Decreased safety awareness;Decreased knowledge of precautions PT Treatment Interventions: DME instruction;Gait training;Functional mobility training;Therapeutic activities;Therapeutic exercise;Patient/family education   PT Goals Acute Rehab PT Goals PT Goal Formulation: With patient Time For Goal Achievement: 06/12/12 Potential to Achieve Goals: Good Pt will go Supine/Side to Sit: with supervision PT Goal: Supine/Side to Sit - Progress: Progressing toward goal Pt will go Sit to Supine/Side: with supervision PT Goal: Sit to Supine/Side - Progress: Progressing toward goal Pt will go Sit to Stand: with supervision PT Goal: Sit to Stand - Progress: Progressing toward goal Pt will go Stand to Sit: with supervision PT Goal: Stand to Sit - Progress: Progressing toward goal Pt will Ambulate: >150 feet;with supervision;with rolling walker PT Goal: Ambulate - Progress: Progressing toward goal Pt will Perform Home Exercise Program: with supervision, verbal cues required/provided PT Goal: Perform Home Exercise Program - Progress: Progressing toward goal  Visit Information  Last PT Received On: 06/05/12 Assistance Needed: +1    Subjective Data  Subjective: I can just get up from the chair and take a few steps. I don't need the walker Patient Stated Goal: to go to rehab   Cognition  Cognition Arousal/Alertness: Awake/alert Behavior During Therapy: WFL for tasks assessed/performed Overall Cognitive Status: Impaired/Different from baseline Area of Impairment: Safety/judgement Safety/Judgement: Decreased awareness of safety General Comments:  moves quickly; cues for safety     Balance     End of Session PT - End of Session  Equipment Utilized During Treatment: Left knee immobilizer Activity Tolerance: Patient tolerated treatment well Patient left: with call bell/phone within reach;in bed;with bed alarm set Nurse Communication: Mobility status CPM Left Knee CPM Left Knee: On   GP     Rada Hay 06/05/2012, 3:13 PM

## 2012-06-05 NOTE — Progress Notes (Signed)
Physical Therapy Treatment Patient Details Name: Corey [name] MRN: [id number] DOB: 1935/04/05 Today's Date: 06/05/2012 Time: 4098-1191 PT Time Calculation (min): 20 min  PT Assessment / Plan / Recommendation Comments on Treatment Session  Pt . tolerated ambulation but due to incr. pain, did not walk as far as previous. Pt. borderline unsafe, needs reinforcement for safety.    Follow Up Recommendations  SNF     Does the patient have the potential to tolerate intense rehabilitation     Barriers to Discharge Decreased caregiver support      Equipment Recommendations  Rolling walker with 5" wheels    Recommendations for Other Services    Frequency 7X/week   Plan Discharge plan remains appropriate    Precautions / Restrictions Precautions Precautions: Fall Precaution Booklet Issued: Yes (comment) Precaution Comments: safety, decr. judgement. Required Braces or Orthoses: Knee Immobilizer - Left Knee Immobilizer - Left: Discontinue once straight leg raise with < 10 degree lag Restrictions Weight Bearing Restrictions: No   Pertinent Vitals/Pain    Mobility  Bed Mobility Bed Mobility: Supine to Sit;Sit to Supine Supine to Sit: 5: Supervision;HOB elevated Details for Bed Mobility Assistance: pt required no assistance and   able to lift  Lleg and move down to floor. Transfers Transfers: Sit to Stand;Stand to Sit Sit to Stand: 4: Min guard;From chair/3-in-1;With armrests Stand to Sit: To chair/3-in-1;With armrests;4: Min guard Details for Transfer Assistance: cues for ue/le positions Ambulation/Gait Ambulation/Gait Assistance: 4: Min assist Ambulation Distance (Feet): 100 Feet Assistive device: Rolling walker Ambulation/Gait Assistance Details: cues for decr. speed and for posture, safety with RW. Gait Pattern: Step-through pattern;Decreased stance time - left;Trunk flexed    Exercises Total Joint Exercises Heel Slides: AROM;Left;10 reps;Supine Straight Leg Raises:  AROM;Left;10 reps;Supine Knee Flexion: AAROM;Left;15 reps;Seated   PT Diagnosis: Difficulty walking;Acute pain  PT Problem List: Decreased strength;Decreased range of motion;Decreased activity tolerance;Decreased mobility;Pain;Decreased knowledge of use of DME;Decreased safety awareness;Decreased knowledge of precautions PT Treatment Interventions: DME instruction;Gait training;Functional mobility training;Therapeutic activities;Therapeutic exercise;Patient/family education   PT Goals Acute Rehab PT Goals PT Goal Formulation: With patient Time For Goal Achievement: 06/12/12 Potential to Achieve Goals: Good Pt will go Supine/Side to Sit: with supervision PT Goal: Supine/Side to Sit - Progress: Progressing toward goal Pt will go Sit to Supine/Side: with supervision PT Goal: Sit to Supine/Side - Progress: Progressing toward goal Pt will go Sit to Stand: with supervision PT Goal: Sit to Stand - Progress: Progressing toward goal Pt will go Stand to Sit: with supervision PT Goal: Stand to Sit - Progress: Progressing toward goal Pt will Ambulate: >150 feet;with supervision;with rolling walker PT Goal: Ambulate - Progress: Progressing toward goal Pt will Perform Home Exercise Program: with supervision, verbal cues required/provided PT Goal: Perform Home Exercise Program - Progress: Progressing toward goal  Visit Information  Last PT Received On: 06/05/12 Assistance Needed: +1    Subjective Data  Subjective: I will stay up for lunch Patient Stated Goal: to go to rehab   Cognition  Cognition Arousal/Alertness: Awake/alert Behavior During Therapy: WFL for tasks assessed/performed Overall Cognitive Status: Impaired/Different from baseline Area of Impairment: Safety/judgement Safety/Judgement: Decreased awareness of safety General Comments: moves quickly; cues for safety     Balance     End of Session PT - End of Session Equipment Utilized During Treatment: Left knee  immobilizer Activity Tolerance: Patient tolerated treatment well Patient left: in chair;with call bell/phone within reach Nurse Communication: Mobility status CPM Left Knee CPM Left Knee: On   GP  Rada Hay 06/05/2012, 3:07 PM Blanchard Kelch PT [phone]

## 2012-06-06 ENCOUNTER — Encounter (HOSPITAL_COMMUNITY): Payer: Medicare Other

## 2012-06-06 LAB — CBC
HCT: 34.9 % — ABNORMAL LOW (ref 39.0–52.0)
Hemoglobin: 11.7 g/dL — ABNORMAL LOW (ref 13.0–17.0)
MCHC: 33.5 g/dL (ref 30.0–36.0)
MCV: 86.8 fL (ref 78.0–100.0)

## 2012-06-06 LAB — PROTIME-INR: INR: 1.55 — ABNORMAL HIGH (ref 0.00–1.49)

## 2012-06-06 MED ORDER — HYDROCODONE-ACETAMINOPHEN 5-325 MG PO TABS: 1.0000 | ORAL_TABLET | Freq: Four times a day (QID) | ORAL | Status: DC | PRN

## 2012-06-06 MED ORDER — TRAMADOL HCL 50 MG PO TABS
50.0000 mg | ORAL_TABLET | Freq: Four times a day (QID) | ORAL | Status: DC | PRN
  Administered 2012-06-06 (×2): 100 mg via ORAL
  Administered 2012-06-06: 50 mg via ORAL
  Administered 2012-06-07 (×2): 100 mg via ORAL
  Filled 2012-06-06 (×4): qty 2
  Filled 2012-06-06: qty 1

## 2012-06-06 MED ORDER — WARFARIN SODIUM 5 MG PO TABS
5.0000 mg | ORAL_TABLET | Freq: Once | ORAL | Status: AC
  Administered 2012-06-06: 5 mg via ORAL
  Filled 2012-06-06: qty 1

## 2012-06-06 NOTE — Progress Notes (Signed)
Physical Therapy Treatment Patient Details Name: Corey [name] MRN: [id number] DOB: Dec 04, 1935 Today's Date: 06/06/2012 Time: 7829-5621 PT Time Calculation (min): 18 min  PT Assessment / Plan / Recommendation Comments on Treatment Session  Pt c/o increased pain and not being controlled. Will limit ambulation today as pain gets under control.     Follow Up Recommendations  SNF     Does the patient have the potential to tolerate intense rehabilitation     Barriers to Discharge        Equipment Recommendations  Rolling walker with 5" wheels    Recommendations for Other Services    Frequency 7X/week   Plan Discharge plan remains appropriate;Frequency remains appropriate    Precautions / Restrictions Precautions Precautions: Fall Precaution Comments: safety, decr. judgement., pt stated  he coul get up without the RW. Required Braces or Orthoses: Knee Immobilizer - Left Knee Immobilizer - Left: Discontinue once straight leg raise with < 10 degree lag Restrictions Weight Bearing Restrictions: No   Pertinent Vitals/Pain 7 L knee after meds, ice applied.    Mobility  Bed Mobility Supine to Sit: 5: Supervision Details for Bed Mobility Assistance: pt uses R foot to support the L leg to swing around to sit at edge of bed. Transfers Sit to Stand: From bed;4: Min assist;With upper extremity assist Stand to Sit: To chair/3-in-1;To bed;4: Min guard Details for Transfer Assistance: pt stepped forward with LLE prior to sitting down. Ambulation/Gait Ambulation/Gait Assistance: 4: Min assist Ambulation Distance (Feet): 100 Feet Assistive device: Rolling walker Ambulation/Gait Assistance Details: cues for safety, sequence.    Exercises     PT Diagnosis:    PT Problem List:   PT Treatment Interventions:     PT Goals Acute Rehab PT Goals Pt will go Supine/Side to Sit: with modified independence PT Goal: Supine/Side to Sit - Progress: Updated due to goal met Pt will go Sit to  Stand: with supervision PT Goal: Sit to Stand - Progress: Progressing toward goal Pt will go Stand to Sit: with supervision PT Goal: Stand to Sit - Progress: Progressing toward goal Pt will Ambulate: >150 feet;with supervision;with rolling walker PT Goal: Ambulate - Progress: Progressing toward goal  Visit Information  Last PT Received On: 06/06/12 Assistance Needed: +1    Subjective Data  Subjective: I am shocked . It is so much worse than yesterday.   Cognition  Cognition Arousal/Alertness: Awake/alert Behavior During Therapy: Anxious (related to pain)    Balance     End of Session PT - End of Session Equipment Utilized During Treatment: Left knee immobilizer Activity Tolerance: Patient limited by pain Patient left: in chair;with call bell/phone within reach Nurse Communication: Mobility status CPM Left Knee CPM Left Knee: Off   GP     Rada Hay 06/06/2012, 10:59 AM

## 2012-06-06 NOTE — Progress Notes (Signed)
Pt seen, already wearing home cpap and tolerating well at this time.  Pt was advised that RT available all night should he need further assistance.

## 2012-06-06 NOTE — Progress Notes (Signed)
ANTICOAGULATION CONSULT NOTE - Follow Up Consult  Pharmacy Consult for Warfarin Indication: h/o afib, VTE prophylaxis s/p L TKA  Allergies  Allergen Reactions  . Hydrocodone Other (See Comments)    Severe panic attacks  . Oxycodone Other (See Comments)    Severe panic attacks    Patient Measurements: Height: 6\' 1"  (185.4 cm) Weight: 161 lb (86.637 kg) IBW/kg (Calculated) : 79.9  Labs:  Recent Labs  06/04/12 1135  06/04/12 1802 06/05/12 0353 06/06/12 0340  HGB  --   < > 12.7* 11.7* 11.7*  HCT  --   --  37.8* 34.0* 34.9*  PLT  --   --  210 218 217  LABPROT 19.1*  --   --  17.7* 18.1*  INR 1.66*  --   --  1.50* 1.55*  CREATININE  --   --  1.06 0.92  --   < > = values in this interval not displayed.  Estimated Creatinine Clearance: 79.6 ml/min (by C-G formula based on Cr of 0.92).   Assessment: 30 yom with h/o afib on Coumadin PTA admitted for L TKA on 5/12. MD ordered to resume Coumadin post-op along with Lovenox 30 mg sq q24h until INR >/= 1.8.   PTA: Warfarin 2.5mg  daily except 5mg  on Mondays, LD 5/8. Coumadin started on 5/13 instead of 5/12 due to entry error.    Amiodarone from PTA resumed  INR 1.55, CBC okay, no bleeding noted.  Goal of Therapy:  INR 2-3 Monitor platelets by anticoagulation protocol: Yes   Plan:   Coumadin 5 mg po x 1 tonight at 1800  Daily PT/INR  Continue Lovenox 30mg  sq q24h until INR >/= 1.8  Geoffry Paradise, PharmD, BCPS Pager: [phone] 8:07 AM Pharmacy #: [id number]

## 2012-06-06 NOTE — Progress Notes (Signed)
Physical Therapy Treatment Patient Details Name: Corey [name] MRN: [id number] DOB: Dec 25, 1935 Today's Date: 06/06/2012 Time: 4098-1191 PT Time Calculation (min): 38 min  PT Assessment / Plan / Recommendation Comments on Treatment Session  Pt appears in less discomfort and not as stressed as AM session. Pt. ambulated in hall without KI. cont. PT    Follow Up Recommendations  SNF     Does the patient have the potential to tolerate intense rehabilitation     Barriers to Discharge        Equipment Recommendations  Rolling walker with 5" wheels    Recommendations for Other Services    Frequency 7X/week   Plan Discharge plan remains appropriate;Frequency remains appropriate    Precautions / Restrictions Precautions Precautions: Fall Precaution Comments: safety, decr. judgement., pt stated  he coul get up without the RW. Required Braces or Orthoses: Knee Immobilizer - Left Knee Immobilizer - Left: Discontinue once straight leg raise with < 10 degree lag   Pertinent Vitals/Pain 4 ice applied.    Mobility  Bed Mobility Supine to Sit: 4: Min guard Sit to Supine: 4: Min guard Details for Bed Mobility Assistance: pt uses R foot to support the L leg to swing around to sit at edge of bed. Transfers Sit to Stand: From bed;4: Min assist;With upper extremity assist Stand to Sit: To bed;4: Min guard Details for Transfer Assistance: pt stepped forward with LLE prior to sitting down. Ambulation/Gait Ambulation/Gait Assistance: 4: Min assist Ambulation Distance (Feet): 130 Feet Assistive device: Rolling walker Ambulation/Gait Assistance Details: did not wear KI and tolerated well. Gait Pattern: Step-through pattern;Decreased stance time - left;Trunk flexed    Exercises Total Joint Exercises Quad Sets: AROM;Both;10 reps;Supine Short Arc Quad: AAROM;Left;10 reps;Supine Heel Slides: AROM;Left;10 reps;Supine Straight Leg Raises: AROM;Left;10 reps;Supine   PT Diagnosis:    PT  Problem List:   PT Treatment Interventions:     PT Goals Acute Rehab PT Goals Pt will go Supine/Side to Sit: with modified independence PT Goal: Supine/Side to Sit - Progress: Progressing toward goal Pt will go Sit to Supine/Side: with supervision PT Goal: Sit to Supine/Side - Progress: Progressing toward goal Pt will go Sit to Stand: with supervision PT Goal: Sit to Stand - Progress: Progressing toward goal Pt will go Stand to Sit: with supervision PT Goal: Stand to Sit - Progress: Progressing toward goal Pt will Ambulate: >150 feet;with supervision;with rolling walker PT Goal: Ambulate - Progress: Progressing toward goal Pt will Perform Home Exercise Program: with supervision, verbal cues required/provided PT Goal: Perform Home Exercise Program - Progress: Progressing toward goal  Visit Information  Last PT Received On: 06/06/12 Assistance Needed: +1    Subjective Data  Subjective: I think it is better   Cognition  Cognition Arousal/Alertness: Awake/alert Behavior During Therapy: Anxious (related to pain) Safety/Judgement: Decreased awareness of safety    Balance  Balance Balance Assessed: Yes Static Standing Balance Static Standing - Balance Support: Left upper extremity supported Static Standing - Level of Assistance: 4: Min assist Static Standing - Comment/# of Minutes: min assistance for balance while standing and using urinal without KI.  End of Session PT - End of Session Equipment Utilized During Treatment: Left knee immobilizer Activity Tolerance: Patient tolerated treatment well Patient left: in bed;with call bell/phone within reach;with bed alarm set;with family/visitor present Nurse Communication: Mobility status   GP     Rada Hay 06/06/2012, 2:10 PM

## 2012-06-06 NOTE — Progress Notes (Signed)
Patient ID: Corey [name], male   DOB: 13-Jun-1935, 77 y.o.   MRN: [id number] Subjective: 2 Days Post-Op Procedure(s) (LRB): TOTAL KNEE ARTHROPLASTY (Left) Patient reports pain as moderate.    Patient has complaints of left knee pain, worse today s/p PT yesterday. No other c/o. Foley removed yesterday, voiding without difficulty. No CP, SOB, N/V.  Objective: Vital signs in last 24 hours: Temp:  [97.8 F (36.6 C)-99.1 F (37.3 C)] 99.1 F (37.3 C) (05/14 0513) Pulse Rate:  [65-73] 65 (05/14 0513) Resp:  [16-18] 16 (05/14 0513) BP: (102-164)/(61-103) 150/80 mmHg (05/14 0513) SpO2:  [94 %-97 %] 96 % (05/14 0513)  Intake/Output from previous day:  Intake/Output Summary (Last 24 hours) at 06/06/12 0830 Last data filed at 06/06/12 0710  Gross per 24 hour  Intake 1723.33 ml  Output   2225 ml  Net -501.67 ml    Intake/Output this shift: Total I/O In: -  Out: 300 [Urine:300]  Labs: Results for orders placed during the hospital encounter of 06/04/12  PROTIME-INR      Result Value Range   Prothrombin Time 19.1 (*) 11.6 - 15.2 seconds   INR 1.66 (*) 0.00 - 1.49  CBC      Result Value Range   WBC 9.6  4.0 - 10.5 K/uL   RBC 3.99 (*) 4.22 - 5.81 MIL/uL   Hemoglobin 11.7 (*) 13.0 - 17.0 g/dL   HCT 40.9 (*) 81.1 - 91.4 %   MCV 85.2  78.0 - 100.0 fL   MCH 29.3  26.0 - 34.0 pg   MCHC 34.4  30.0 - 36.0 g/dL   RDW 78.2  95.6 - 21.3 %   Platelets 218  150 - 400 K/uL  BASIC METABOLIC PANEL      Result Value Range   Sodium 135  135 - 145 mEq/L   Potassium 4.3  3.5 - 5.1 mEq/L   Chloride 102  96 - 112 mEq/L   CO2 25  19 - 32 mEq/L   Glucose, Bld 122 (*) 70 - 99 mg/dL   BUN 16  6 - 23 mg/dL   Creatinine, Ser 0.86  0.50 - 1.35 mg/dL   Calcium 8.3 (*) 8.4 - 10.5 mg/dL   GFR calc non Af Amer 81 (*) >90 mL/min   GFR calc Af Amer >90  >90 mL/min  PROTIME-INR      Result Value Range   Prothrombin Time 17.7 (*) 11.6 - 15.2 seconds   INR 1.50 (*) 0.00 - 1.49  CBC      Result Value  Range   WBC 7.6  4.0 - 10.5 K/uL   RBC 4.41  4.22 - 5.81 MIL/uL   Hemoglobin 12.7 (*) 13.0 - 17.0 g/dL   HCT 57.8 (*) 46.9 - 62.9 %   MCV 85.7  78.0 - 100.0 fL   MCH 28.8  26.0 - 34.0 pg   MCHC 33.6  30.0 - 36.0 g/dL   RDW 52.8  41.3 - 24.4 %   Platelets 210  150 - 400 K/uL  CREATININE, SERUM      Result Value Range   Creatinine, Ser 1.06  0.50 - 1.35 mg/dL   GFR calc non Af Amer 67 (*) >90 mL/min   GFR calc Af Amer 78 (*) >90 mL/min  CBC      Result Value Range   WBC 7.4  4.0 - 10.5 K/uL   RBC 4.02 (*) 4.22 - 5.81 MIL/uL   Hemoglobin 11.7 (*) 13.0 - 17.0 g/dL  HCT 34.9 (*) 39.0 - 52.0 %   MCV 86.8  78.0 - 100.0 fL   MCH 29.1  26.0 - 34.0 pg   MCHC 33.5  30.0 - 36.0 g/dL   RDW 04.5  40.9 - 81.1 %   Platelets 217  150 - 400 K/uL  PROTIME-INR      Result Value Range   Prothrombin Time 18.1 (*) 11.6 - 15.2 seconds   INR 1.55 (*) 0.00 - 1.49    Exam - Neurologically intact ABD soft Neurovascular intact Sensation intact distally Intact pulses distally Dorsiflexion/Plantar flexion intact Incision: dressing C/D/I and no drainage No cellulitis present Compartment soft Dressing/Incision - clean, dry, no drainage Motor function intact - moving foot and toes well on exam. No calf pain or sign of DVT  Assessment/Plan: 2 Days Post-Op Procedure(s) (LRB): TOTAL KNEE ARTHROPLASTY (Left)  Advance diet Up with therapy Past Medical History  Diagnosis Date  . Alcohol abuse, episodic 05/19/2008  . ALLERGIC RHINITIS 04/15/2009  . COLONIC POLYPS, ADENOMATOUS, HX OF 02/14/2008  . DIVERTICULOSIS, COLON 02/14/2008  . ESOPHAGEAL STRICTURE 01/28/2008  . GERD 02/14/2008  . HYPERGLYCEMIA 11/16/2006  . HYPERLIPIDEMIA 02/14/2008  . INSOMNIA-SLEEP DISORDER-UNSPEC 02/11/2009  . 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 PROSTATE CANCER, HX OF 02/25/2000  . TRANSIENT ISCHEMIC ATTACKS, HX OF 11/16/2006  . Arthritis   . Atrial fibrillation 11/16/2006    remote CHF related to atrial fib with rapid  ventricular response over 25 yrs per office note,  . ASTHMA 11/16/2006    sinusitis-    hx ?yeast patch/white patch on vocal cord as per [facility] 02/25/11-   . OBSTRUCTIVE SLEEP APNEA 11/10/2008  . SLEEP APNEA 10/03/2009    LOV Dr Maple Hudson 12/12 in EPIC    Moderate per patient- settings ?6/last sleep study years ago  . HYPERTENSION 11/16/2006  . CORONARY ARTERY DISEASE 11/16/2006    nonischemic cardiomyapathy, last stress test 2011- LOV with clearance note Dr  Alanda Amass and EKG 1/13 on chart  . Symptomatic bradycardia, secondary to sinus node dysfunction 07/08/2011    s/p [facility] [employer] PPM by Dr Royann Shivers, upgraded to BiV ICD 12/13 by Dr Johney Frame ([facility])  . Myocardial infarction 1993  . Pacemaker     St. Jude  . ICD (implantable cardiac defibrillator) in place   . Anxiety   . Depression   . Pneumonia     hx of  . Headache   . Cancer 2002    prostate cancer    DVT Prophylaxis - Coumadin, bridging with Lovenox until therapeutic Weight-Bearing as tolerated to left leg Plan D/C to SNF when ready (tomorrow at earliest) Discussed with Dr. Elissa Lovett, Dayna Barker. 06/06/2012, 8:30 AM

## 2012-06-07 ENCOUNTER — Ambulatory Visit: Payer: Medicare Other | Admitting: Physical Therapy

## 2012-06-07 DIAGNOSIS — R279 Unspecified lack of coordination: Secondary | ICD-10-CM | POA: Diagnosis not present

## 2012-06-07 DIAGNOSIS — S99929A Unspecified injury of unspecified foot, initial encounter: Secondary | ICD-10-CM | POA: Diagnosis not present

## 2012-06-07 DIAGNOSIS — M1710 Unilateral primary osteoarthritis, unspecified knee: Secondary | ICD-10-CM | POA: Diagnosis not present

## 2012-06-07 DIAGNOSIS — K219 Gastro-esophageal reflux disease without esophagitis: Secondary | ICD-10-CM | POA: Diagnosis not present

## 2012-06-07 DIAGNOSIS — Z95810 Presence of automatic (implantable) cardiac defibrillator: Secondary | ICD-10-CM | POA: Diagnosis not present

## 2012-06-07 DIAGNOSIS — I2510 Atherosclerotic heart disease of native coronary artery without angina pectoris: Secondary | ICD-10-CM | POA: Diagnosis not present

## 2012-06-07 DIAGNOSIS — E785 Hyperlipidemia, unspecified: Secondary | ICD-10-CM | POA: Diagnosis not present

## 2012-06-07 DIAGNOSIS — I10 Essential (primary) hypertension: Secondary | ICD-10-CM | POA: Diagnosis not present

## 2012-06-07 DIAGNOSIS — Z96659 Presence of unspecified artificial knee joint: Secondary | ICD-10-CM | POA: Diagnosis not present

## 2012-06-07 DIAGNOSIS — G4700 Insomnia, unspecified: Secondary | ICD-10-CM | POA: Diagnosis not present

## 2012-06-07 DIAGNOSIS — F411 Generalized anxiety disorder: Secondary | ICD-10-CM | POA: Diagnosis not present

## 2012-06-07 DIAGNOSIS — Z471 Aftercare following joint replacement surgery: Secondary | ICD-10-CM | POA: Diagnosis not present

## 2012-06-07 DIAGNOSIS — J45909 Unspecified asthma, uncomplicated: Secondary | ICD-10-CM | POA: Diagnosis not present

## 2012-06-07 DIAGNOSIS — R269 Unspecified abnormalities of gait and mobility: Secondary | ICD-10-CM | POA: Diagnosis not present

## 2012-06-07 DIAGNOSIS — M6281 Muscle weakness (generalized): Secondary | ICD-10-CM | POA: Diagnosis not present

## 2012-06-07 DIAGNOSIS — Z950 Presence of cardiac pacemaker: Secondary | ICD-10-CM | POA: Diagnosis not present

## 2012-06-07 DIAGNOSIS — M1990 Unspecified osteoarthritis, unspecified site: Secondary | ICD-10-CM | POA: Diagnosis not present

## 2012-06-07 DIAGNOSIS — M25569 Pain in unspecified knee: Secondary | ICD-10-CM | POA: Diagnosis not present

## 2012-06-07 DIAGNOSIS — I4891 Unspecified atrial fibrillation: Secondary | ICD-10-CM | POA: Diagnosis not present

## 2012-06-07 DIAGNOSIS — Z7901 Long term (current) use of anticoagulants: Secondary | ICD-10-CM | POA: Diagnosis not present

## 2012-06-07 LAB — CBC
HCT: 33.4 % — ABNORMAL LOW (ref 39.0–52.0)
Hemoglobin: 11.1 g/dL — ABNORMAL LOW (ref 13.0–17.0)
WBC: 9.1 10*3/uL (ref 4.0–10.5)

## 2012-06-07 LAB — PROTIME-INR: INR: 1.58 — ABNORMAL HIGH (ref 0.00–1.49)

## 2012-06-07 MED ORDER — ENOXAPARIN SODIUM 30 MG/0.3ML ~~LOC~~ SOLN: 30.0000 mg | SUBCUTANEOUS | Status: DC

## 2012-06-07 NOTE — Progress Notes (Signed)
Clinical Social Work Department CLINICAL SOCIAL WORK PLACEMENT NOTE 06/07/2012  Patient:  Corey [name], Corey [name]  Account Number:  [id number] Admit date:  06/04/2012  Clinical Social Worker:  Cori Razor, LCSW  Date/time:  06/05/2012 11:07 AM  Clinical Social Work is seeking post-discharge placement for this patient at the following level of care:   SKILLED NURSING   (*CSW will update this form in Epic as items are completed)     Patient/family provided with [facility] Department of Clinical Social Work's list of facilities offering this level of care within the geographic area requested by the patient (or if unable, by the patient's family).  06/05/2012  Patient/family informed of their freedom to choose among providers that offer the needed level of care, that participate in Medicare, Medicaid or managed care program needed by the patient, have an available bed and are willing to accept the patient.    Patient/family informed of [facility]' ownership interest in [facility], as well as of the fact that they are under no obligation to receive care at this facility.  PASARR submitted to EDS on 06/04/2012 PASARR number received from EDS on 06/04/2012  FL2 transmitted to all facilities in geographic area requested by pt/family on  06/05/2012 FL2 transmitted to all facilities within larger geographic area on   Patient informed that his/her managed care company has contracts with or will negotiate with  certain facilities, including the following:     Patient/family informed of bed offers received:  06/05/2012 Patient chooses bed at [facility] Physician recommends and patient chooses bed at    Patient to be transferred to [facility] on  06/07/2012 Patient to be transferred to facility by [facility]  The following physician request were entered in Epic:   Additional Comments: Cori Razor LCSW [phone]

## 2012-06-07 NOTE — Progress Notes (Signed)
Subjective: 3 Days Post-Op Procedure(s) (LRB): TOTAL KNEE ARTHROPLASTY (Left) Patient reports pain as mild.  Improved from yesterday with ice and ultram. No other c/o.   Objective: Vital signs in last 24 hours: Temp:  [98 F (36.7 C)-99.4 F (37.4 C)] 99.4 F (37.4 C) (05/15 0522) Pulse Rate:  [69-72] 69 (05/15 0522) Resp:  [16] 16 (05/15 0522) BP: (121-163)/(71-91) 121/71 mmHg (05/15 0522) SpO2:  [94 %-98 %] 94 % (05/15 0522)  Intake/Output from previous day: 05/14 0701 - 05/15 0700 In: 960 [P.O.:960] Out: 1325 [Urine:1325] Intake/Output this shift:     Recent Labs  06/04/12 1802 06/05/12 0353 06/06/12 0340 06/07/12 0421  HGB 12.7* 11.7* 11.7* 11.1*    Recent Labs  06/06/12 0340 06/07/12 0421  WBC 7.4 9.1  RBC 4.02* 3.86*  HCT 34.9* 33.4*  PLT 217 192    Recent Labs  06/04/12 1802 06/05/12 0353  NA  --  135  K  --  4.3  CL  --  102  CO2  --  25  BUN  --  16  CREATININE 1.06 0.92  GLUCOSE  --  122*  CALCIUM  --  8.3*    Recent Labs  06/06/12 0340 06/07/12 0421  INR 1.55* 1.58*    Neurologically intact ABD soft Neurovascular intact Sensation intact distally Intact pulses distally Dorsiflexion/Plantar flexion intact Incision: dressing C/D/I and no drainage No cellulitis present Compartment soft No calf pain or sign of DVT  Assessment/Plan: 3 Days Post-Op Procedure(s) (LRB): TOTAL KNEE ARTHROPLASTY (Left) Advance diet Up with therapy Reviewed D/C instructions Coumadin resumed, INR subtherapeutic at 1.58 Continue Lovenox 30mg  subQ daily until INR >/= 1.8  Plan for D/C to SNF later today  Will discuss with Dr. Elissa Lovett, Dayna Barker. 06/07/2012, 8:24 AM

## 2012-06-07 NOTE — Discharge Summary (Signed)
Physician Discharge Summary   Patient ID: Corey [name] MRN: [id number] DOB/AGE: 1935/02/23 77 y.o.  Admit date: 06/04/2012 Discharge date:   Primary Diagnosis: left knee DJD  Admission Diagnoses:  Past Medical History  Diagnosis Date  . Alcohol abuse, episodic 05/19/2008  . ALLERGIC RHINITIS 04/15/2009  . COLONIC POLYPS, ADENOMATOUS, HX OF 02/14/2008  . DIVERTICULOSIS, COLON 02/14/2008  . ESOPHAGEAL STRICTURE 01/28/2008  . GERD 02/14/2008  . HYPERGLYCEMIA 11/16/2006  . HYPERLIPIDEMIA 02/14/2008  . INSOMNIA-SLEEP DISORDER-UNSPEC 02/11/2009  . 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 PROSTATE CANCER, HX OF 02/25/2000  . TRANSIENT ISCHEMIC ATTACKS, HX OF 11/16/2006  . Arthritis   . Atrial fibrillation 11/16/2006    remote CHF related to atrial fib with rapid ventricular response over 25 yrs per office note,  . ASTHMA 11/16/2006    sinusitis-    hx ?yeast patch/white patch on vocal cord as per [facility] 02/25/11-   . OBSTRUCTIVE SLEEP APNEA 11/10/2008  . SLEEP APNEA 10/03/2009    LOV Dr Maple Hudson 12/12 in EPIC    Moderate per patient- settings ?6/last sleep study years ago  . HYPERTENSION 11/16/2006  . CORONARY ARTERY DISEASE 11/16/2006    nonischemic cardiomyapathy, last stress test 2011- LOV with clearance note Dr  Alanda Amass and EKG 1/13 on chart  . Symptomatic bradycardia, secondary to sinus node dysfunction 07/08/2011    s/p [facility] Scientific PPM by Dr Royann Shivers, upgraded to BiV ICD 12/13 by Dr Johney Frame ([facility])  . Myocardial infarction 1993  . Pacemaker     St. Jude  . ICD (implantable cardiac defibrillator) in place   . Anxiety   . Depression   . Pneumonia     hx of  . Headache   . Cancer 2002    prostate cancer   Discharge Diagnoses:   Principal Problem:   Left knee DJD  Estimated body mass index is 25.2 kg/(m^2) as calculated from the following:   Height as of this encounter: 6\' 1"  (1.854 m).   Weight as of this encounter: 86.637 kg (191 lb).  Procedure:  Procedure(s)  (LRB): TOTAL KNEE ARTHROPLASTY (Left)   Consults: None  HPI: see H&P Laboratory Data: Admission on 06/04/2012  Component Date Value Range Status  . Prothrombin Time 06/04/2012 19.1* 11.6 - 15.2 seconds Final  . INR 06/04/2012 1.66* 0.00 - 1.49 Final  . WBC 06/05/2012 9.6  4.0 - 10.5 K/uL Final  . RBC 06/05/2012 3.99* 4.22 - 5.81 MIL/uL Final  . Hemoglobin 06/05/2012 11.7* 13.0 - 17.0 g/dL Final  . HCT 40/98/1191 34.0* 39.0 - 52.0 % Final  . MCV 06/05/2012 85.2  78.0 - 100.0 fL Final  . MCH 06/05/2012 29.3  26.0 - 34.0 pg Final  . MCHC 06/05/2012 34.4  30.0 - 36.0 g/dL Final  . RDW 47/82/9562 14.5  11.5 - 15.5 % Final  . Platelets 06/05/2012 218  150 - 400 K/uL Final  . Sodium 06/05/2012 135  135 - 145 mEq/L Final  . Potassium 06/05/2012 4.3  3.5 - 5.1 mEq/L Final  . Chloride 06/05/2012 102  96 - 112 mEq/L Final  . CO2 06/05/2012 25  19 - 32 mEq/L Final  . Glucose, Bld 06/05/2012 122* 70 - 99 mg/dL Final  . BUN 13/08/6576 16  6 - 23 mg/dL Final  . Creatinine, Ser 06/05/2012 0.92  0.50 - 1.35 mg/dL Final  . Calcium 46/96/2952 8.3* 8.4 - 10.5 mg/dL Final  . GFR calc non Af Amer 06/05/2012 81* >90 mL/min Final  . GFR calc Af  Amer 06/05/2012 >90  >90 mL/min Final   Comment:                                 The eGFR has been calculated                          using the CKD EPI equation.                          This calculation has not been                          validated in all clinical                          situations.                          eGFR's persistently                          <90 mL/min signify                          possible Chronic Kidney Disease.  Marland Kitchen Prothrombin Time 06/05/2012 17.7* 11.6 - 15.2 seconds Final  . INR 06/05/2012 1.50* 0.00 - 1.49 Final  . WBC 06/04/2012 7.6  4.0 - 10.5 K/uL Final  . RBC 06/04/2012 4.41  4.22 - 5.81 MIL/uL Final  . Hemoglobin 06/04/2012 12.7* 13.0 - 17.0 g/dL Final  . HCT 16/10/9602 37.8* 39.0 - 52.0 % Final  . MCV  06/04/2012 85.7  78.0 - 100.0 fL Final  . MCH 06/04/2012 28.8  26.0 - 34.0 pg Final  . MCHC 06/04/2012 33.6  30.0 - 36.0 g/dL Final  . RDW 54/09/8117 14.7  11.5 - 15.5 % Final  . Platelets 06/04/2012 210  150 - 400 K/uL Final  . Creatinine, Ser 06/04/2012 1.06  0.50 - 1.35 mg/dL Final  . GFR calc non Af Amer 06/04/2012 67* >90 mL/min Final  . GFR calc Af Amer 06/04/2012 78* >90 mL/min Final   Comment:                                 The eGFR has been calculated                          using the CKD EPI equation.                          This calculation has not been                          validated in all clinical                          situations.                          eGFR's persistently                          <  90 mL/min signify                          possible Chronic Kidney Disease.  . WBC 06/06/2012 7.4  4.0 - 10.5 K/uL Final  . RBC 06/06/2012 4.02* 4.22 - 5.81 MIL/uL Final  . Hemoglobin 06/06/2012 11.7* 13.0 - 17.0 g/dL Final  . HCT 54/09/8117 34.9* 39.0 - 52.0 % Final  . MCV 06/06/2012 86.8  78.0 - 100.0 fL Final  . MCH 06/06/2012 29.1  26.0 - 34.0 pg Final  . MCHC 06/06/2012 33.5  30.0 - 36.0 g/dL Final  . RDW 14/78/2956 15.2  11.5 - 15.5 % Final  . Platelets 06/06/2012 217  150 - 400 K/uL Final  . Prothrombin Time 06/06/2012 18.1* 11.6 - 15.2 seconds Final  . INR 06/06/2012 1.55* 0.00 - 1.49 Final  . WBC 06/07/2012 9.1  4.0 - 10.5 K/uL Final  . RBC 06/07/2012 3.86* 4.22 - 5.81 MIL/uL Final  . Hemoglobin 06/07/2012 11.1* 13.0 - 17.0 g/dL Final  . HCT 21/30/8657 33.4* 39.0 - 52.0 % Final  . MCV 06/07/2012 86.5  78.0 - 100.0 fL Final  . MCH 06/07/2012 28.8  26.0 - 34.0 pg Final  . MCHC 06/07/2012 33.2  30.0 - 36.0 g/dL Final  . RDW 84/69/6295 15.3  11.5 - 15.5 % Final  . Platelets 06/07/2012 192  150 - 400 K/uL Final  . Prothrombin Time 06/07/2012 18.4* 11.6 - 15.2 seconds Final  . INR 06/07/2012 1.58* 0.00 - 1.49 Final  Hospital Outpatient Visit on 05/31/2012   Component Date Value Range Status  . aPTT 05/31/2012 60* 24 - 37 seconds Final   Comment:                                 IF BASELINE aPTT IS ELEVATED,                          SUGGEST PATIENT RISK ASSESSMENT                          BE USED TO DETERMINE APPROPRIATE                          ANTICOAGULANT THERAPY.  . MRSA, PCR 05/31/2012 NEGATIVE  NEGATIVE Final  . Staphylococcus aureus 05/31/2012 NEGATIVE  NEGATIVE Final   Comment:                                 The Xpert SA Assay (FDA                          approved for NASAL specimens                          in patients over 16 years of age),                          is one component of                          a comprehensive surveillance  program.  Test performance has                          been validated by [facility] for patients greater                          than or equal to 34 year old.                          It is not intended                          to diagnose infection nor to                          guide or monitor treatment.  . Sodium 05/31/2012 137  135 - 145 mEq/L Final  . Potassium 05/31/2012 4.6  3.5 - 5.1 mEq/L Final  . Chloride 05/31/2012 101  96 - 112 mEq/L Final  . CO2 05/31/2012 30  19 - 32 mEq/L Final  . Glucose, Bld 05/31/2012 98  70 - 99 mg/dL Final  . BUN 47/82/9562 18  6 - 23 mg/dL Final  . Creatinine, Ser 05/31/2012 1.11  0.50 - 1.35 mg/dL Final  . Calcium 13/08/6576 9.3  8.4 - 10.5 mg/dL Final  . GFR calc non Af Amer 05/31/2012 63* >90 mL/min Final  . GFR calc Af Amer 05/31/2012 74* >90 mL/min Final   Comment:                                 The eGFR has been calculated                          using the CKD EPI equation.                          This calculation has not been                          validated in all clinical                          situations.                          eGFR's persistently                           <90 mL/min signify                          possible Chronic Kidney Disease.  . WBC 05/31/2012 4.1  4.0 - 10.5 K/uL Final  . RBC 05/31/2012 4.55  4.22 - 5.81 MIL/uL Final  . Hemoglobin 05/31/2012 13.2  13.0 - 17.0 g/dL Final  . HCT 46/96/2952 39.2  39.0 - 52.0 % Final  . MCV 05/31/2012 86.2  78.0 - 100.0 fL Final  . MCH 05/31/2012 29.0  26.0 - 34.0  pg Final  . MCHC 05/31/2012 33.7  30.0 - 36.0 g/dL Final  . RDW 16/10/9602 15.2  11.5 - 15.5 % Final  . Platelets 05/31/2012 232  150 - 400 K/uL Final  . Prothrombin Time 05/31/2012 41.4* 11.6 - 15.2 seconds Final  . INR 05/31/2012 4.73* 0.00 - 1.49 Final  . ABO/RH(D) 05/31/2012 AB POS   Final  . Antibody Screen 05/31/2012 NEG   Final  . Sample Expiration 05/31/2012 06/07/2012   Final  . Color, Urine 05/31/2012 YELLOW  YELLOW Final  . APPearance 05/31/2012 CLEAR  CLEAR Final  . Specific Gravity, Urine 05/31/2012 1.027  1.005 - 1.030 Final  . pH 05/31/2012 6.5  5.0 - 8.0 Final  . Glucose, UA 05/31/2012 NEGATIVE  NEGATIVE mg/dL Final  . Hgb urine dipstick 05/31/2012 NEGATIVE  NEGATIVE Final  . Bilirubin Urine 05/31/2012 SMALL* NEGATIVE Final  . Ketones, ur 05/31/2012 NEGATIVE  NEGATIVE mg/dL Final  . Protein, ur 54/09/8117 NEGATIVE  NEGATIVE mg/dL Final  . Urobilinogen, UA 05/31/2012 1.0  0.0 - 1.0 mg/dL Final  . Nitrite 14/78/2956 NEGATIVE  NEGATIVE Final  . Leukocytes, UA 05/31/2012 NEGATIVE  NEGATIVE Final   MICROSCOPIC NOT DONE ON URINES WITH NEGATIVE PROTEIN, BLOOD, LEUKOCYTES, NITRITE, OR GLUCOSE <1000 mg/dL.  . ABO/RH(D) 05/31/2012 AB POS   Final  Office Visit on 05/23/2012  Component Date Value Range Status  . DEVICE MODEL ICD 05/23/2012 [id number]   Final-Edited  . DEV-[id number] 05/23/2012 Allred, Fayrene Fearing      Final-Edited  . DEV-0020ICD 05/23/2012 N   Final-Edited  . [id number] 05/23/2012 Croitoru, Mihai   MD   Final-Edited  . [id number] 05/23/2012 Allred, Fayrene Fearing      Final-Edited  . [id number] 05/23/2012 Allred, Fayrene Fearing       Final-Edited  . PACEART [facility] NOTES ICD 05/23/2012    Final-Edited                   Value:CRT-D device check in office. Thresholds and sensing consistent with previous device measurements. Lead impedance trends stable over time. No mode switch episodes recorded. No ventricular arrhythmia episodes recorded. Patient bi-ventricularly pacing >99%                          of the time. Device programmed with appropriate safety margins. Heart failure diagnostics reviewed and trends are stable for patient. Audible/vibratory alerts demonstrated for patient. No changes made this session. Estimated longevity 6.3 years.  Plan                          to follow with [facility]  . AL IMPEDENCE ICD 05/23/2012 540   Final-Edited  . LV LEAD IMPEDENCE ICD 05/23/2012 980   Final-Edited  . RV LEAD IMPEDENCE ICD 05/23/2012 460   Final-Edited  . HV IMPEDENCE 05/23/2012 72   Final-Edited  . VENTRICULAR PACING ICD 05/23/2012 99   Final-Edited  . ATRIAL PACING ICD 05/23/2012 99   Final-Edited  . AL AMPLITUDE 05/23/2012 2   Final-Edited  . RV LEAD AMPLITUDE 05/23/2012 12   Final-Edited  . AL THRESHOLD 05/23/2012 0.75   Final-Edited  . RV LEAD THRESHOLD 05/23/2012 1   Final-Edited  . LV LEAD THRESHOLD 05/23/2012 1.25   Final-Edited  . BAMS-0001 05/23/2012 150   Final-Edited  . BAMS-0003 05/23/2012 70   Final-Edited  . TZON-[id number] 05/23/2012 340   Final-Edited  . TZON-0004SLOWVT 05/23/2012 40   Final-Edited  . TZON-0005SLOWVT 05/23/2012 6  Final-Edited  . TZON-0008SLOWVT 05/23/2012 Off   Final-Edited  . TZON-0010SLOWVT 05/23/2012 40   Final-Edited  Appointment on 05/16/2012  Component Date Value Range Status  . Interferon Gamma Release Assay 05/16/2012 NEGATIVE  NEGATIVE Final      . TB Ag value 05/16/2012 0.31   Final  . Quantiferon Nil Value 05/16/2012 0.25   Final  . Mitogen value 05/16/2012 3.71   Final  . Quantiferon Tb Ag Minus Nil Value 05/16/2012 0.06   Final   Comment:                            A  positive result is indicated by a TB Ag value minus Nil value                          greater than or equal to 0.35 IU/mL and the TB Ag value minus Nil                          value must be greater than or equal to 25% of the Nil value. There may                          be insufficient information in these values to differentiate between                          some negative and some indeterminate test values.                                                     The QuaniFERON TB Gold assay is intended for use as an aid in the                          diagnosis of TB infection. Negative results suggest that there is no                          TB infection. In patients with high suspicion of exposure, a negative                          test should be repeated. A positive test can support the diagnosis of                          infection with Mycobacterium tuberculosis. Among individuals without                          tuberculosis infection, a positive test may be due to exposure to M.                          kansasii, M. szulgai or M. marinum.  The performance of the FDA approved QuantiFERON(R)-TB Gold test has                          not been extensively evaluated with specimens from the following                          groups of individuals:                                                     A. Individuals younger than 75 years of age.                          B. Pregnant women.                          C. Individuals who have impaired or altered immune function such as                          those who have HIV infection or AIDS, those who have                          transplantation managed with immunosuppressive drugs (e.g. managed                          with immunosuppressive drugs (e.g. corticosteroids, methotrexate,                          azathioprine, cancer chemotherapy), and those who have other                           clinical conditions: diabetes, silicosis, chronic renal failure,                          hematological disorders (e.g., leukemia and lymphomas), and other                          specific malignancies (e.g., carcinoma of the head and neck or                          lung).                                X-Rays:Dg Knee 1-2 Views Left  05/31/2012   *RADIOLOGY REPORT*  Clinical Data: Medial left knee pain and weakness.  LEFT KNEE - 1-2 VIEW  Comparison: 01/03/2011  Findings: There is progressive fairly severe tricompartmental arthritis with medial joint space narrowing and increased marginal spur formation.  There is a new small effusion.  IMPRESSION: Progressive tricompartmental osteoarthritis.   Original Report Authenticated By: Francene Boyers, M.D.   Dg Knee Left Port  06/04/2012   *RADIOLOGY REPORT*  Clinical Data: Postop left knee.  PORTABLE LEFT KNEE - 1-2 VIEW  Comparison: 05/31/2012  Findings: The patient has had interval tricompartmental knee replacement.  No evidence for immediate hardware complications. Surgical  drain overlies the anteromedial soft tissues. Gas in the overlying soft tissues is compatible with immediate postoperative state.  IMPRESSION: Status post tricompartmental knee replacement.  No evidence for immediate hardware complications.   Original Report Authenticated By: Kennith Center, M.D.    EKG: Orders placed in visit on 05/23/12  . EKG 12-LEAD     Hospital Course: Corey [name] is a 77 y.o. who was admitted to [facility]. They were brought to the operating room on 06/04/2012 and underwent Procedure(s): TOTAL KNEE ARTHROPLASTY.  Patient tolerated the procedure well and was later transferred to the recovery room and then to the orthopaedic floor for postoperative care.  They were given PO and IV analgesics for pain control following their surgery.  They were given 24 hours of postoperative antibiotics of  Anti-infectives   Start     Dose/Rate Route  Frequency Ordered Stop   06/04/12 1800  ceFAZolin (ANCEF) IVPB 2 g/50 mL premix     2 g 100 mL/hr over 30 Minutes Intravenous Every 6 hours 06/04/12 1752 06/05/12 0619   06/04/12 1358  polymyxin B 500,000 Units, bacitracin 50,000 Units in sodium chloride irrigation 0.9 % 500 mL irrigation  Status:  Discontinued       As needed 06/04/12 1358 06/04/12 1525   06/04/12 1130  ceFAZolin (ANCEF) IVPB 2 g/50 mL premix     2 g 100 mL/hr over 30 Minutes Intravenous On call to O.R. 06/04/12 1119 06/04/12 1327     and started on DVT prophylaxis in the form of Lovenox, Coumadin, TED hose and SCDs.  Coumadin resumed with Lovenox ordered 30mg  subQ daily until INR >/= 1.8. PT and OT were ordered for total joint protocol.  Discharge planning consulted to help with postop disposition and equipment needs.  Patient had a good night on the evening of surgery.  They started to get up OOB with therapy on day one. Hemovac drain was pulled without difficulty.  Continued to work with therapy into day two. By day three, the patient had progressed with therapy and meeting their goals.  Incision was healing well. Aquacel dressing in place.  Patient was seen in rounds and was ready to go home.   Discharge Medications: Prior to Admission medications   Medication Sig Start Date End Date Taking? Authorizing Provider  acetaminophen (TYLENOL) 500 MG tablet Take 500 mg by mouth every 6 (six) hours as needed for pain. Per bottle as needed for pain   Yes Historical Provider, MD  albuterol (PROVENTIL HFA;VENTOLIN HFA) 108 (90 BASE) MCG/ACT inhaler Inhale 2 puffs into the lungs every 6 (six) hours as needed for shortness of breath.    Yes Historical Provider, MD  amiodarone (PACERONE) 200 MG tablet Take 200 mg by mouth daily.  08/11/11  Yes Historical Provider, MD  aspirin EC 81 MG tablet Take 81 mg by mouth every evening.    Yes Historical Provider, MD  LORazepam (ATIVAN) 0.5 MG tablet Take 0.5 mg by mouth every evening.    Yes  Historical Provider, MD  metoprolol succinate (TOPROL-XL) 50 MG 24 hr tablet Take 50 mg by mouth 2 (two) times daily. Take with or immediately following a meal.   Yes Historical Provider, MD  mometasone (NASONEX) 50 MCG/ACT nasal spray Place 1-2 sprays into the nose at bedtime as needed (for allergies).    Yes Historical Provider, MD  pantoprazole (PROTONIX) 40 MG tablet Take 1 tablet (40 mg total) by mouth daily. 02/15/12  Yes Hart Carwin, MD  ramipril (ALTACE) 10 MG capsule Take 10 mg by mouth daily.   Yes Historical Provider, MD  rosuvastatin (CRESTOR) 5 MG tablet Take 5 mg by mouth daily with breakfast.   Yes Historical Provider, MD  spironolactone (ALDACTONE) 25 MG tablet Take 25 mg by mouth daily.  10/27/11  Yes Historical Provider, MD  zolpidem (AMBIEN) 5 MG tablet Take 2.5 mg by mouth at bedtime as needed for sleep.   Yes Historical Provider, MD  enoxaparin (LOVENOX) 30 MG/0.3ML injection Inject 0.3 mLs (30 mg total) into the skin daily. 06/07/12   Dayna Barker. Cambre Matson, PA-C  polyethylene glycol (MIRALAX / GLYCOLAX) packet Take 8.5 g by mouth daily as needed.    Historical Provider, MD  traMADol (ULTRAM) 50 MG tablet Take 1 tablet (50 mg total) by mouth every 6 (six) hours as needed for pain. 06/04/12   Javier Docker, MD  warfarin (COUMADIN) 5 MG tablet Take 2.5-5 mg by mouth daily. Take 5mg  on mondays. Take 2.5mg  the rest of the week    Historical Provider, MD    Diet: Regular diet Activity:WBAT Follow-up:in 10-14 days Disposition - Skilled nursing facility Discharged Condition: good   Discharge Orders   Future Orders Complete By Expires     Call MD / Call 911  As directed     Comments:      If you experience chest pain or shortness of breath, CALL 911 and be transported to the hospital emergency room.  If you develope a fever above 101 F, pus (white drainage) or increased drainage or redness at the wound, or calf pain, call your surgeon's office.    Constipation Prevention  As  directed     Comments:      Drink plenty of fluids.  Prune juice may be helpful.  You may use a stool softener, such as Colace (over the counter) 100 mg twice a day.  Use MiraLax (over the counter) for constipation as needed.    Diet - low sodium heart healthy  As directed     Increase activity slowly as tolerated  As directed         Medication List    TAKE these medications       acetaminophen 500 MG tablet  Commonly known as:  TYLENOL  Take 500 mg by mouth every 6 (six) hours as needed for pain. Per bottle as needed for pain     albuterol 108 (90 BASE) MCG/ACT inhaler  Commonly known as:  PROVENTIL HFA;VENTOLIN HFA  Inhale 2 puffs into the lungs every 6 (six) hours as needed for shortness of breath.     amiodarone 200 MG tablet  Commonly known as:  PACERONE  Take 200 mg by mouth daily.     aspirin EC 81 MG tablet  Take 81 mg by mouth every evening.     enoxaparin 30 MG/0.3ML injection  Commonly known as:  LOVENOX  Inject 0.3 mLs (30 mg total) into the skin daily.     LORazepam 0.5 MG tablet  Commonly known as:  ATIVAN  Take 0.5 mg by mouth every evening.     metoprolol succinate 50 MG 24 hr tablet  Commonly known as:  TOPROL-XL  Take 50 mg by mouth 2 (two) times daily. Take with or immediately following a meal.     mometasone 50 MCG/ACT nasal spray  Commonly known as:  NASONEX  Place 1-2 sprays into the nose at bedtime as needed (for allergies).     pantoprazole 40 MG  tablet  Commonly known as:  PROTONIX  Take 1 tablet (40 mg total) by mouth daily.     polyethylene glycol packet  Commonly known as:  MIRALAX / GLYCOLAX  Take 8.5 g by mouth daily as needed.     ramipril 10 MG capsule  Commonly known as:  ALTACE  Take 10 mg by mouth daily.     rosuvastatin 5 MG tablet  Commonly known as:  CRESTOR  Take 5 mg by mouth daily with breakfast.     spironolactone 25 MG tablet  Commonly known as:  ALDACTONE  Take 25 mg by mouth daily.     traMADol 50 MG tablet   Commonly known as:  ULTRAM  Take 1 tablet (50 mg total) by mouth every 6 (six) hours as needed for pain.     warfarin 5 MG tablet  Commonly known as:  COUMADIN  Take 2.5-5 mg by mouth daily. Take 5mg  on mondays. Take 2.5mg  the rest of the week     zolpidem 5 MG tablet  Commonly known as:  AMBIEN  Take 2.5 mg by mouth at bedtime as needed for sleep.      Lovenox 30mg  subQ daily is being used for bridging purposes with coumadin and is to be continued daily until INR >/= 1.8     Follow-up Information   Follow up with BEANE,JEFFREY C, MD In 2 weeks.   Contact information:   [address] [place] [address] [phone]       Signed: Dorothy Spark. 06/07/2012, 8:29 AM

## 2012-06-11 ENCOUNTER — Encounter (HOSPITAL_COMMUNITY): Payer: Medicare Other

## 2012-06-13 ENCOUNTER — Encounter (HOSPITAL_COMMUNITY): Payer: Medicare Other

## 2012-06-13 ENCOUNTER — Encounter: Payer: Self-pay | Admitting: Adult Health

## 2012-06-13 ENCOUNTER — Non-Acute Institutional Stay (SKILLED_NURSING_FACILITY): Payer: Medicare Other | Admitting: Internal Medicine

## 2012-06-13 DIAGNOSIS — M1710 Unilateral primary osteoarthritis, unspecified knee: Secondary | ICD-10-CM

## 2012-06-13 DIAGNOSIS — K219 Gastro-esophageal reflux disease without esophagitis: Secondary | ICD-10-CM

## 2012-06-13 DIAGNOSIS — I2510 Atherosclerotic heart disease of native coronary artery without angina pectoris: Secondary | ICD-10-CM

## 2012-06-13 DIAGNOSIS — M1712 Unilateral primary osteoarthritis, left knee: Secondary | ICD-10-CM

## 2012-06-13 DIAGNOSIS — I4891 Unspecified atrial fibrillation: Secondary | ICD-10-CM

## 2012-06-19 DIAGNOSIS — Z96659 Presence of unspecified artificial knee joint: Secondary | ICD-10-CM | POA: Diagnosis not present

## 2012-06-20 ENCOUNTER — Encounter (HOSPITAL_COMMUNITY): Payer: Medicare Other

## 2012-06-22 DIAGNOSIS — Z96659 Presence of unspecified artificial knee joint: Secondary | ICD-10-CM | POA: Diagnosis not present

## 2012-06-25 ENCOUNTER — Encounter (HOSPITAL_COMMUNITY): Payer: Medicare Other

## 2012-06-26 ENCOUNTER — Encounter: Payer: Self-pay | Admitting: *Deleted

## 2012-06-26 DIAGNOSIS — M1710 Unilateral primary osteoarthritis, unspecified knee: Secondary | ICD-10-CM | POA: Diagnosis not present

## 2012-06-27 ENCOUNTER — Encounter (HOSPITAL_COMMUNITY): Payer: Medicare Other

## 2012-06-27 NOTE — Progress Notes (Signed)
This encounter was created in error - please disregard.

## 2012-06-29 DIAGNOSIS — M1710 Unilateral primary osteoarthritis, unspecified knee: Secondary | ICD-10-CM | POA: Diagnosis not present

## 2012-07-02 ENCOUNTER — Other Ambulatory Visit: Payer: Self-pay | Admitting: Dermatology

## 2012-07-02 ENCOUNTER — Encounter (HOSPITAL_COMMUNITY): Payer: Medicare Other

## 2012-07-02 DIAGNOSIS — D485 Neoplasm of uncertain behavior of skin: Secondary | ICD-10-CM | POA: Diagnosis not present

## 2012-07-02 DIAGNOSIS — L578 Other skin changes due to chronic exposure to nonionizing radiation: Secondary | ICD-10-CM | POA: Diagnosis not present

## 2012-07-02 DIAGNOSIS — Z85828 Personal history of other malignant neoplasm of skin: Secondary | ICD-10-CM | POA: Diagnosis not present

## 2012-07-02 DIAGNOSIS — L570 Actinic keratosis: Secondary | ICD-10-CM | POA: Diagnosis not present

## 2012-07-03 DIAGNOSIS — M1710 Unilateral primary osteoarthritis, unspecified knee: Secondary | ICD-10-CM | POA: Diagnosis not present

## 2012-07-04 ENCOUNTER — Encounter (HOSPITAL_COMMUNITY): Payer: Medicare Other

## 2012-07-05 DIAGNOSIS — M1710 Unilateral primary osteoarthritis, unspecified knee: Secondary | ICD-10-CM | POA: Diagnosis not present

## 2012-07-06 NOTE — Progress Notes (Signed)
Patient ID: Corey [name], male   DOB: Sep 03, 1935, 77 y.o.   MRN: [id number]        HISTORY & PHYSICAL  DATE: 06/13/2012   FACILITY: [facility]  LEVEL OF CARE: SNF (31)  ALLERGIES:  Allergies  Allergen Reactions  . Hydrocodone Other (See Comments)    Severe panic attacks  . Oxycodone Other (See Comments)    Severe panic attacks    CHIEF COMPLAINT:  Manage left knee osteoarthritis, atrial fibrillation, and coronary artery disease.    HISTORY OF PRESENT ILLNESS:  The patient is a 77 year-old, Caucasian male.    KNEE OSTEOARTHRITIS: Patient had a history of pain and functional disability in the knee due to end-stage osteoarthritis and has failed nonsurgical conservative treatments. Patient had worsening of pain with activity and weight bearing, pain that interfered with activities of daily living & pain with passive range of motion. Therefore patient underwent total knee arthroplasty and tolerated the procedure well. Patient is admitted to this facility for sort short-term rehabilitation. Patient denies knee pain.   CAD: The angina has been stable. The patient denies dyspnea on exertion, orthopnea, pedal edema, palpitations and paroxysmal nocturnal dyspnea. No complications noted from the medication presently being used.   ATRIAL FIBRILLATION: the patients atrial fibrillation remains stable.  The patient denies DOE, tachycardia, orthopnea, transient neurological sx, pedal edema, palpitations, & PNDs.  No complications noted from the medications currently being used.   PAST MEDICAL HISTORY :  Past Medical History  Diagnosis Date  . Alcohol abuse, episodic 05/19/2008  . ALLERGIC RHINITIS 04/15/2009  . COLONIC POLYPS, ADENOMATOUS, HX OF 02/14/2008  . DIVERTICULOSIS, COLON 02/14/2008  . ESOPHAGEAL STRICTURE 01/28/2008  . GERD 02/14/2008  . HYPERGLYCEMIA 11/16/2006  . HYPERLIPIDEMIA 02/14/2008  . INSOMNIA-SLEEP DISORDER-UNSPEC 02/11/2009  . Other testicular hypofunction  08/26/2009  . PROSTATE CANCER, HX OF 02/25/2000  . TRANSIENT ISCHEMIC ATTACKS, HX OF 11/16/2006  . Arthritis   . Atrial fibrillation 11/16/2006    remote CHF related to atrial fib with rapid ventricular response over 25 yrs per office note,  . ASTHMA 11/16/2006    sinusitis-    hx ?yeast patch/white patch on vocal cord as per [facility] 02/25/11-   . OBSTRUCTIVE SLEEP APNEA 11/10/2008  . SLEEP APNEA 10/03/2009    LOV Dr Maple Hudson 12/12 in [facility]    Moderate per patient- settings ?6/last sleep study years ago  . HYPERTENSION 11/16/2006  . CORONARY ARTERY DISEASE 11/16/2006    nonischemic cardiomyapathy, last stress test 2011- LOV with clearance note Dr  Alanda Amass and EKG 1/13 on chart  . Symptomatic bradycardia, secondary to sinus node dysfunction 07/08/2011    s/p [facility] Scientific PPM by Dr Royann Shivers, upgraded to BiV ICD 12/13 by Dr Johney Frame (SJM)  . Myocardial infarction 1993  . Pacemaker     St. Jude  . ICD (implantable cardiac defibrillator) in place   . Anxiety   . Depression   . Pneumonia     hx of  . Headache(784.0)   . Cancer 2002    prostate cancer    PAST SURGICAL HISTORY: Past Surgical History  Procedure Laterality Date  . Tonsilectomy, adenoidectomy, bilateral myringotomy and tubes    . Knee arthroscopy      2 surgeries right and 1 left  . Prostate surgery  2/02    prostate cancer  . Uvulopalatopharyngoplasty  2013  . Esophagogastroduodenoscopy      with dilitation  . Lumbar laminectomy/decompression microdiscectomy  03/02/2011  Procedure: LUMBAR LAMINECTOMY/DECOMPRESSION MICRODISCECTOMY;  Surgeon: Javier Docker, MD;  Location: [facility];  Service: Orthopedics;  Laterality: N/A;  Decompression Lumbar 4 - Lumbar(X-Ray)  . Pacemaker insertion  06/2011    [employer] PPM implanted by Dr [employer]  . Cardiac defibrillator placement  01/16/12    Upgrade to a biventricular SJM ICD by DR Allred  . Tonsillectomy  as child  . Cardiac catheterization      several  . Back surgery    .  Insert / replace / remove pacemaker    . Total knee arthroplasty Left 06/04/2012    Procedure: TOTAL KNEE ARTHROPLASTY;  Surgeon: Javier Docker, MD;  Location: [facility];  Service: Orthopedics;  Laterality: Left;    SOCIAL HISTORY:  reports that he quit smoking about 48 years ago. His smoking use included Cigarettes. He has a 4 pack-year smoking history. He has never used smokeless tobacco. He reports that  drinks alcohol. He reports that he does not use illicit drugs.  FAMILY HISTORY:  Family History  Problem Relation Age of Onset  . Heart attack Father   . Heart attack Brother   . Heart disease Maternal Uncle   . Heart attack Maternal Uncle   . Colon cancer Neg Hx     CURRENT MEDICATIONS: Reviewed per [facility]  REVIEW OF SYSTEMS:  See HPI otherwise 14 point ROS is negative.  PHYSICAL EXAMINATION  VS:  T 97.7       P 91      RR 20      BP 125/74      POX 98% room air        WT (Lb)  GENERAL: no acute distress, normal body habitus EYES: conjunctivae normal, sclerae normal, normal eye lids MOUTH/THROAT: lips without lesions,no lesions in the mouth,tongue is without lesions,uvula elevates in midline NECK: supple, trachea midline, no neck masses, no thyroid tenderness, no thyromegaly LYMPHATICS: no LAN in the neck, no supraclavicular LAN RESPIRATORY: breathing is even & unlabored, BS CTAB CARDIAC: RRR, no murmur,no extra heart sounds, no edema GI:  ABDOMEN: abdomen soft, normal BS, no masses, no tenderness  LIVER/SPLEEN: no hepatomegaly, no splenomegaly MUSCULOSKELETAL: HEAD: normal to inspection & palpation BACK: no kyphosis, scoliosis or spinal processes tenderness EXTREMITIES: LEFT UPPER EXTREMITY: full range of motion, normal strength & tone RIGHT UPPER EXTREMITY:  full range of motion, normal strength & tone LEFT LOWER EXTREMITY: strength intact, range of motion not tested due to surgery, left knee is edematous  RIGHT LOWER EXTREMITY:  full range of motion, normal strength &  tone PSYCHIATRIC: the patient is alert & oriented to person, affect & behavior appropriate  LABS/RADIOLOGY: PT 98.1, INR 1.66, PTT 16.      Hemoglobin 11.7, MCV 85.2, otherwise CBC normal.    Glucose 122, otherwise BMP normal.    MRSA by PCR negative.    Staph aureus by PCR negative.   Urinalysis negative.   Left knee x-ray showed progressive tricompartmental osteoarthritis.    Left knee x-ray postsurgically showed status post tricompartmental knee replacement with no complications.    ASSESSMENT/PLAN:  Left knee osteoarthritis.  Status post left total knee arthroplasty.  Continue rehabilitation.   Atrial fibrillation.  Rate controlled.   CAD.  Well controlled.    GERD.  Well controlled.    Check CBC and BMP.   I have reviewed patient's medical records received at admission/from hospitalization.  CPT CODE: 16109

## 2012-07-09 ENCOUNTER — Encounter (HOSPITAL_COMMUNITY): Payer: Medicare Other

## 2012-07-09 DIAGNOSIS — M1710 Unilateral primary osteoarthritis, unspecified knee: Secondary | ICD-10-CM | POA: Diagnosis not present

## 2012-07-11 ENCOUNTER — Encounter (HOSPITAL_COMMUNITY): Payer: Medicare Other

## 2012-07-12 DIAGNOSIS — M1710 Unilateral primary osteoarthritis, unspecified knee: Secondary | ICD-10-CM | POA: Diagnosis not present

## 2012-07-16 ENCOUNTER — Encounter (HOSPITAL_COMMUNITY): Payer: Medicare Other

## 2012-07-16 DIAGNOSIS — M1710 Unilateral primary osteoarthritis, unspecified knee: Secondary | ICD-10-CM | POA: Diagnosis not present

## 2012-07-18 ENCOUNTER — Encounter (HOSPITAL_COMMUNITY): Payer: Medicare Other

## 2012-07-19 DIAGNOSIS — M1710 Unilateral primary osteoarthritis, unspecified knee: Secondary | ICD-10-CM | POA: Diagnosis not present

## 2012-07-23 ENCOUNTER — Telehealth: Payer: Self-pay | Admitting: Internal Medicine

## 2012-07-23 ENCOUNTER — Ambulatory Visit (INDEPENDENT_AMBULATORY_CARE_PROVIDER_SITE_OTHER): Payer: Medicare Other | Admitting: Family Medicine

## 2012-07-23 ENCOUNTER — Encounter (HOSPITAL_COMMUNITY): Payer: Medicare Other

## 2012-07-23 ENCOUNTER — Ambulatory Visit (INDEPENDENT_AMBULATORY_CARE_PROVIDER_SITE_OTHER): Payer: Medicare Other

## 2012-07-23 ENCOUNTER — Encounter: Payer: Self-pay | Admitting: Family Medicine

## 2012-07-23 VITALS — BP 114/72 | Temp 98.2°F | Wt 184.0 lb

## 2012-07-23 DIAGNOSIS — M1710 Unilateral primary osteoarthritis, unspecified knee: Secondary | ICD-10-CM | POA: Diagnosis not present

## 2012-07-23 DIAGNOSIS — I4891 Unspecified atrial fibrillation: Secondary | ICD-10-CM | POA: Diagnosis not present

## 2012-07-23 DIAGNOSIS — I495 Sick sinus syndrome: Secondary | ICD-10-CM

## 2012-07-23 DIAGNOSIS — Z7901 Long term (current) use of anticoagulants: Secondary | ICD-10-CM | POA: Diagnosis not present

## 2012-07-23 DIAGNOSIS — I10 Essential (primary) hypertension: Secondary | ICD-10-CM

## 2012-07-23 DIAGNOSIS — Z8679 Personal history of other diseases of the circulatory system: Secondary | ICD-10-CM | POA: Diagnosis not present

## 2012-07-23 DIAGNOSIS — J309 Allergic rhinitis, unspecified: Secondary | ICD-10-CM

## 2012-07-23 DIAGNOSIS — I252 Old myocardial infarction: Secondary | ICD-10-CM

## 2012-07-23 DIAGNOSIS — R7309 Other abnormal glucose: Secondary | ICD-10-CM

## 2012-07-23 LAB — POCT URINALYSIS DIPSTICK
Bilirubin, UA: NEGATIVE
Glucose, UA: NEGATIVE
Leukocytes, UA: NEGATIVE
Nitrite, UA: NEGATIVE
pH, UA: 6.5

## 2012-07-23 LAB — BASIC METABOLIC PANEL
BUN: 18 mg/dL (ref 6–23)
Calcium: 9.3 mg/dL (ref 8.4–10.5)
Creatinine, Ser: 1.1 mg/dL (ref 0.4–1.5)
GFR: 67.31 mL/min (ref >60.00)
Glucose, Bld: 99 mg/dL (ref 70–99)

## 2012-07-23 LAB — HEPATIC FUNCTION PANEL: Albumin: 3.9 g/dL (ref 3.5–5.2)

## 2012-07-23 LAB — CBC WITH DIFFERENTIAL/PLATELET
Eosinophils Relative: 4.2 % (ref 0.0–5.0)
HCT: 37.3 % — ABNORMAL LOW (ref 39.0–52.0)
Hemoglobin: 12.3 g/dL — ABNORMAL LOW (ref 13.0–17.0)
Lymphocytes Relative: 31.3 % (ref 12.0–46.0)
Lymphs Abs: 1.6 10*3/uL (ref 0.7–4.0)
Monocytes Relative: 10.6 % (ref 3.0–12.0)
Neutro Abs: 2.7 10*3/uL (ref 1.4–7.7)
RBC: 4.05 Mil/uL — ABNORMAL LOW (ref 4.22–5.81)
WBC: 5.2 10*3/uL (ref 4.5–10.5)

## 2012-07-23 LAB — LIPID PANEL
Cholesterol: 148 mg/dL (ref 0–200)
HDL: 48 mg/dL (ref >39.00)
VLDL: 22.8 mg/dL (ref 0.0–40.0)

## 2012-07-23 LAB — TSH: TSH: 2.25 u[IU]/mL (ref 0.35–5.50)

## 2012-07-23 NOTE — Progress Notes (Signed)
  Subjective:    Patient ID: Corey [name], male    DOB: 1935/10/19, 77 y.o.   MRN: [id number]  HPI Here asking for labs at the advice of Dr. Shelle Iron, his orthopedist to assess fatigue. In general the patient has been doing well but is more fatigued than normal. No other specific sx.    Review of Systems  Constitutional: Positive for fatigue. Negative for fever, chills and diaphoresis.  Respiratory: Negative.   Cardiovascular: Negative.   Gastrointestinal: Negative.   Genitourinary: Negative.   Neurological: Negative.        Objective:   Physical Exam  Constitutional: He is oriented to person, place, and time. He appears well-developed and well-nourished.  Neck: Neck supple. No thyromegaly present.  Cardiovascular: Normal rate, regular rhythm, normal heart sounds and intact distal pulses.   Pulmonary/Chest: Effort normal and breath sounds normal.  Lymphadenopathy:    He has no cervical adenopathy.  Neurological: He is alert and oriented to person, place, and time.          Assessment & Plan:  Fatigue with multiple possible etiologies. Get labs today.

## 2012-07-23 NOTE — Addendum Note (Signed)
Addended by: Rita Ohara R on: 07/23/2012 11:08 AM   Modules accepted: Orders

## 2012-07-23 NOTE — Telephone Encounter (Signed)
Spoke with CY about this;the patient can choose to stop the vaccine or he can start at 0.109ml today and slowing build back up-then see how his allergies do in the fall. Pollen count is down at this time and its harder to tell if the vaccine is working. I spoke with the patient about this; he chose to NOT get any injections at this time and speak with CY at his July 2014 appointment.

## 2012-07-24 NOTE — Progress Notes (Signed)
Quick Note:  I left voice message with results. ______ 

## 2012-07-25 ENCOUNTER — Encounter (HOSPITAL_COMMUNITY): Payer: Medicare Other

## 2012-07-30 ENCOUNTER — Encounter (HOSPITAL_COMMUNITY): Payer: Medicare Other

## 2012-07-30 DIAGNOSIS — M1710 Unilateral primary osteoarthritis, unspecified knee: Secondary | ICD-10-CM | POA: Diagnosis not present

## 2012-07-31 ENCOUNTER — Ambulatory Visit (INDEPENDENT_AMBULATORY_CARE_PROVIDER_SITE_OTHER): Payer: Medicare Other

## 2012-07-31 DIAGNOSIS — J309 Allergic rhinitis, unspecified: Secondary | ICD-10-CM

## 2012-08-01 ENCOUNTER — Encounter (HOSPITAL_COMMUNITY): Payer: Medicare Other

## 2012-08-02 DIAGNOSIS — M1710 Unilateral primary osteoarthritis, unspecified knee: Secondary | ICD-10-CM | POA: Diagnosis not present

## 2012-08-04 ENCOUNTER — Other Ambulatory Visit: Payer: Self-pay | Admitting: Internal Medicine

## 2012-08-06 ENCOUNTER — Ambulatory Visit (INDEPENDENT_AMBULATORY_CARE_PROVIDER_SITE_OTHER): Payer: Medicare Other | Admitting: Internal Medicine

## 2012-08-06 ENCOUNTER — Encounter (HOSPITAL_COMMUNITY): Payer: Medicare Other

## 2012-08-06 ENCOUNTER — Encounter: Payer: Self-pay | Admitting: Internal Medicine

## 2012-08-06 ENCOUNTER — Ambulatory Visit (INDEPENDENT_AMBULATORY_CARE_PROVIDER_SITE_OTHER): Payer: Medicare Other

## 2012-08-06 VITALS — BP 110/70 | HR 68 | Resp 20 | Wt 183.0 lb

## 2012-08-06 DIAGNOSIS — M48061 Spinal stenosis, lumbar region without neurogenic claudication: Secondary | ICD-10-CM | POA: Diagnosis not present

## 2012-08-06 DIAGNOSIS — I495 Sick sinus syndrome: Secondary | ICD-10-CM

## 2012-08-06 DIAGNOSIS — R5383 Other fatigue: Secondary | ICD-10-CM | POA: Diagnosis not present

## 2012-08-06 DIAGNOSIS — J309 Allergic rhinitis, unspecified: Secondary | ICD-10-CM | POA: Diagnosis not present

## 2012-08-06 DIAGNOSIS — I252 Old myocardial infarction: Secondary | ICD-10-CM

## 2012-08-06 DIAGNOSIS — R5381 Other malaise: Secondary | ICD-10-CM | POA: Diagnosis not present

## 2012-08-06 DIAGNOSIS — I10 Essential (primary) hypertension: Secondary | ICD-10-CM | POA: Diagnosis not present

## 2012-08-06 DIAGNOSIS — E785 Hyperlipidemia, unspecified: Secondary | ICD-10-CM

## 2012-08-06 DIAGNOSIS — I5022 Chronic systolic (congestive) heart failure: Secondary | ICD-10-CM

## 2012-08-06 DIAGNOSIS — M5126 Other intervertebral disc displacement, lumbar region: Secondary | ICD-10-CM | POA: Diagnosis not present

## 2012-08-06 MED ORDER — SERTRALINE HCL 50 MG PO TABS: 50.0000 mg | ORAL_TABLET | Freq: Every day | ORAL | Status: DC

## 2012-08-06 NOTE — Progress Notes (Signed)
  Subjective:    Patient ID: Corey [name], male    DOB: 07-Dec-1935, 77 y.o.   MRN: [id number]  HPI  Wt Readings from Last 3 Encounters:  08/06/12 183 lb (83.008 kg)  07/23/12 184 lb (83.462 kg)  06/04/12 191 lb (86.637 kg)    Review of Systems     Objective:   Physical Exam        Assessment & Plan:

## 2012-08-06 NOTE — Patient Instructions (Addendum)
It is important that you exercise regularly, at least 20 minutes 3 to 4 times per week.  If you develop chest pain or shortness of breath seek  medical attention.  Limit your sodium (Salt) intake  Return in 6 weeks for followupInsomnia Insomnia is frequent trouble falling and/or staying asleep. Insomnia can be a long term problem or a short term problem. Both are common. Insomnia can be a short term problem when the wakefulness is related to a certain stress or worry. Long term insomnia is often related to ongoing stress during waking hours and/or poor sleeping habits. Overtime, sleep deprivation itself can make the problem worse. Every little thing feels more severe because you are overtired and your ability to cope is decreased. CAUSES   Stress, anxiety, and depression.  Poor sleeping habits.  Distractions such as TV in the bedroom.  Naps close to bedtime.  Engaging in emotionally charged conversations before bed.  Technical reading before sleep.  Alcohol and other sedatives. They may make the problem worse. They can hurt normal sleep patterns and normal dream activity.  Stimulants such as caffeine for several hours prior to bedtime.  Pain syndromes and shortness of breath can cause insomnia.  Exercise late at night.  Changing time zones may cause sleeping problems (jet lag). It is sometimes helpful to have someone observe your sleeping patterns. They should look for periods of not breathing during the night (sleep apnea). They should also look to see how long those periods last. If you live alone or observers are uncertain, you can also be observed at a sleep clinic where your sleep patterns will be professionally monitored. Sleep apnea requires a checkup and treatment. Give your caregivers your medical history. Give your caregivers observations your family has made about your sleep.  SYMPTOMS   Not feeling rested in the morning.  Anxiety and restlessness at  bedtime.  Difficulty falling and staying asleep. TREATMENT   Your caregiver may prescribe treatment for an underlying medical disorders. Your caregiver can give advice or help if you are using alcohol or other drugs for self-medication. Treatment of underlying problems will usually eliminate insomnia problems.  Medications can be prescribed for short time use. They are generally not recommended for lengthy use.  Over-the-counter sleep medicines are not recommended for lengthy use. They can be habit forming.  You can promote easier sleeping by making lifestyle changes such as:  Using relaxation techniques that help with breathing and reduce muscle tension.  Exercising earlier in the day.  Changing your diet and the time of your last meal. No night time snacks.  Establish a regular time to go to bed.  Counseling can help with stressful problems and worry.  Soothing music and white noise may be helpful if there are background noises you cannot remove.  Stop tedious detailed work at least one hour before bedtime. HOME CARE INSTRUCTIONS   Keep a diary. Inform your caregiver about your progress. This includes any medication side effects. See your caregiver regularly. Take note of:  Times when you are asleep.  Times when you are awake during the night.  The quality of your sleep.  How you feel the next day. This information will help your caregiver care for you.  Get out of bed if you are still awake after 15 minutes. Read or do some quiet activity. Keep the lights down. Wait until you feel sleepy and go back to bed.  Keep regular sleeping and waking hours. Avoid naps.  Exercise regularly.  Avoid distractions at bedtime. Distractions include watching television or engaging in any intense or detailed activity like attempting to balance the household checkbook.  Develop a bedtime ritual. Keep a familiar routine of bathing, brushing your teeth, climbing into bed at the same time  each night, listening to soothing music. Routines increase the success of falling to sleep faster.  Use relaxation techniques. This can be using breathing and muscle tension release routines. It can also include visualizing peaceful scenes. You can also help control troubling or intruding thoughts by keeping your mind occupied with boring or repetitive thoughts like the old concept of counting sheep. You can make it more creative like imagining planting one beautiful flower after another in your backyard garden.  During your day, work to eliminate stress. When this is not possible use some of the previous suggestions to help reduce the anxiety that accompanies stressful situations. MAKE SURE YOU:   Understand these instructions.  Will watch your condition.  Will get help right away if you are not doing well or get worse. Document Released: 01/08/2000 Document Revised: 04/04/2011 Document Reviewed: 02/07/2007 [facility] Patient Information 2014 [place].

## 2012-08-06 NOTE — Progress Notes (Signed)
Subjective:    Patient ID: Corey [name], male    DOB: 1935-07-14, 77 y.o.   MRN: [id number]  HPI  77 year old patient who presents with a chief complaint of fatigue. He states this has been present for least 16 months. Over this period time he is at a number of procedures and surgeries.  He was seen recently 2 weeks ago a laboratory screen was unremarkable.  Additional complaints include right flank discomfort. He describes anorexia and some occasional nausea.    He states he retired 2 weeks ago and was fully employee at until that time.  He sleeps poorly but retired for the evening as early as 9 PM. He is on low dose Ambien.  He has multiple medical problems including chronic systolic heart failure. He is status post pacemaker insertion for tachybradycardia syndrome. He remains on chronic anticoagulation. He does have a history of OSA requiring CPAP. He also has a history of lumbar spinal stenosis  Past Medical History  Diagnosis Date  . Alcohol abuse, episodic 05/19/2008  . ALLERGIC RHINITIS 04/15/2009  . COLONIC POLYPS, ADENOMATOUS, HX OF 02/14/2008  . DIVERTICULOSIS, COLON 02/14/2008  . ESOPHAGEAL STRICTURE 01/28/2008  . GERD 02/14/2008  . HYPERGLYCEMIA 11/16/2006  . HYPERLIPIDEMIA 02/14/2008  . INSOMNIA-SLEEP DISORDER-UNSPEC 02/11/2009  . 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 PROSTATE CANCER, HX OF 02/25/2000  . TRANSIENT ISCHEMIC ATTACKS, HX OF 11/16/2006  . Arthritis   . Atrial fibrillation 11/16/2006    remote CHF related to atrial fib with rapid ventricular response over 25 yrs per office note,  . ASTHMA 11/16/2006    sinusitis-    hx ?yeast patch/white patch on vocal cord as per [facility] 02/25/11-   . OBSTRUCTIVE SLEEP APNEA 11/10/2008  . SLEEP APNEA 10/03/2009    LOV Dr Maple Hudson 12/12 in [facility]    Moderate per patient- settings ?6/last sleep study years ago  . HYPERTENSION 11/16/2006  . CORONARY ARTERY DISEASE 11/16/2006    nonischemic cardiomyapathy, last stress test 2011- LOV with  clearance note Dr  Alanda Amass and EKG 1/13 on chart  . Symptomatic bradycardia, secondary to sinus node dysfunction 07/08/2011    s/p [facility] Scientific PPM by Dr Royann Shivers, upgraded to BiV ICD 12/13 by Dr Johney Frame (SJM)  . Myocardial infarction 1993  . Pacemaker     St. Jude  . ICD (implantable cardiac defibrillator) in place   . Anxiety   . Depression   . Pneumonia     hx of  . Headache(784.0)   . Cancer 2002    prostate cancer    History   Social History  . Marital Status: Married    Spouse Name: N/A    Number of Children: N/A  . Years of Education: N/A   Occupational History  . Not on file.   Social History Main Topics  . Smoking status: Former Smoker -- 1.00 packs/day for 4 years    Types: Cigarettes    Quit date: 03/22/1964  . Smokeless tobacco: Never Used     Comment: reports smoked socially  . Alcohol Use: 0.0 oz/week     Comment: 2 mixed drinks daily, some days  . Drug Use: No  . Sexually Active: Not on file   Other Topics Concern  . Not on file   Social History Narrative  . No narrative on file    Past Surgical History  Procedure Laterality Date  . Tonsilectomy, adenoidectomy, bilateral myringotomy and tubes    . Knee arthroscopy      2  surgeries right and 1 left  . Prostate surgery  2/02    prostate cancer  . Uvulopalatopharyngoplasty  2013  . Esophagogastroduodenoscopy      with dilitation  . Lumbar laminectomy/decompression microdiscectomy  03/02/2011    Procedure: LUMBAR LAMINECTOMY/DECOMPRESSION MICRODISCECTOMY;  Surgeon: Javier Docker, MD;  Location: [facility];  Service: Orthopedics;  Laterality: N/A;  Decompression Lumbar 4 - Lumbar(X-Ray)  . Pacemaker insertion  06/2011    [employer] PPM implanted by Dr [employer]  . Cardiac defibrillator placement  01/16/12    Upgrade to a biventricular SJM ICD by DR Allred  . Tonsillectomy  as child  . Cardiac catheterization      several  . Back surgery    . Insert / replace / remove pacemaker    .  Total knee arthroplasty Left 06/04/2012    Procedure: TOTAL KNEE ARTHROPLASTY;  Surgeon: Javier Docker, MD;  Location: [facility];  Service: Orthopedics;  Laterality: Left;    Family History  Problem Relation Age of Onset  . Heart attack Father   . Heart attack Brother   . Heart disease Maternal Uncle   . Heart attack Maternal Uncle   . Colon cancer Neg Hx     Allergies  Allergen Reactions  . Hydrocodone Other (See Comments)    Severe panic attacks  . Oxycodone Other (See Comments)    Severe panic attacks    Current Outpatient Prescriptions on File Prior to Visit  Medication Sig Dispense Refill  . acetaminophen (TYLENOL) 500 MG tablet Take 500 mg by mouth every 6 (six) hours as needed for pain. Per bottle as needed for pain      . albuterol (PROVENTIL HFA;VENTOLIN HFA) 108 (90 BASE) MCG/ACT inhaler Inhale 2 puffs into the lungs every 6 (six) hours as needed for shortness of breath.       Marland Kitchen amiodarone (PACERONE) 200 MG tablet Take 200 mg by mouth daily.       Marland Kitchen aspirin EC 81 MG tablet Take 81 mg by mouth every evening.       Marland Kitchen LORazepam (ATIVAN) 0.5 MG tablet Take 0.5 mg by mouth every evening.       . metoprolol succinate (TOPROL-XL) 50 MG 24 hr tablet Take 50 mg by mouth 2 (two) times daily. Take with or immediately following a meal.      . mometasone (NASONEX) 50 MCG/ACT nasal spray Place 1-2 sprays into the nose at bedtime as needed (for allergies).       . pantoprazole (PROTONIX) 40 MG tablet TAKE ONE TABLET BY MOUTH ONCE DAILY  30 tablet  2  . polyethylene glycol (MIRALAX / GLYCOLAX) packet Take 8.5 g by mouth daily as needed.      . ramipril (ALTACE) 10 MG capsule Take 10 mg by mouth daily.      . rosuvastatin (CRESTOR) 5 MG tablet Take 5 mg by mouth daily with breakfast.      . spironolactone (ALDACTONE) 25 MG tablet Take 25 mg by mouth daily.       . traMADol (ULTRAM) 50 MG tablet Take 1 tablet (50 mg total) by mouth every 6 (six) hours as needed for pain.  40 tablet  1  .  warfarin (COUMADIN) 5 MG tablet Take 2.5-5 mg by mouth daily. Take 5mg  on mondays. Take 2.5mg  the rest of the week      . zolpidem (AMBIEN) 5 MG tablet Take 2.5 mg by mouth at bedtime as needed for sleep.  No current facility-administered medications on file prior to visit.    BP 110/70  Pulse 68  Resp 20  Wt 183 lb (83.008 kg)  BMI 24.15 kg/m2  SpO2 97%        Review of Systems  Constitutional: Positive for fatigue. Negative for fever, chills and appetite change.  HENT: Negative for hearing loss, ear pain, congestion, sore throat, trouble swallowing, neck stiffness, dental problem, voice change and tinnitus.   Eyes: Negative for pain, discharge and visual disturbance.  Respiratory: Negative for cough, chest tightness, wheezing and stridor.   Cardiovascular: Negative for chest pain, palpitations and leg swelling.  Gastrointestinal: Negative for nausea, vomiting, abdominal pain, diarrhea, constipation, blood in stool and abdominal distention.  Genitourinary: Negative for urgency, hematuria, flank pain, discharge, difficulty urinating and genital sores.  Musculoskeletal: Positive for back pain. Negative for myalgias, joint swelling, arthralgias and gait problem.  Skin: Negative for rash.  Neurological: Negative for dizziness, syncope, speech difficulty, weakness, numbness and headaches.  Hematological: Negative for adenopathy. Does not bruise/bleed easily.  Psychiatric/Behavioral: Positive for sleep disturbance, dysphoric mood and decreased concentration. Negative for behavioral problems. The patient is not nervous/anxious.        Objective:   Physical Exam  Constitutional: He is oriented to person, place, and time. He appears well-developed.  HENT:  Head: Normocephalic.  Right Ear: External ear normal.  Left Ear: External ear normal.  Eyes: Conjunctivae and EOM are normal.  Neck: Normal range of motion.  Cardiovascular: Normal rate, regular rhythm, normal heart sounds  and intact distal pulses.   Pulmonary/Chest: Breath sounds normal.  Abdominal: Bowel sounds are normal.  Musculoskeletal: Normal range of motion. He exhibits no edema and no tenderness.  Neurological: He is alert and oriented to person, place, and time.  Dysphoric mood  Psychiatric: His behavior is normal. Judgment and thought content normal.  Dysphoric mood          Assessment & Plan:   Chronic fatigue. Suspect the depression. Recent lab reviewed we'll check a ESR Chronic systolic heart failure. Compensated Insomnia. Sleep hygiene issues discussed Hypertension well controlled

## 2012-08-07 ENCOUNTER — Ambulatory Visit: Payer: Medicare Other | Admitting: Internal Medicine

## 2012-08-08 ENCOUNTER — Encounter (HOSPITAL_COMMUNITY): Payer: Medicare Other

## 2012-08-09 ENCOUNTER — Ambulatory Visit: Payer: Medicare Other | Admitting: Internal Medicine

## 2012-08-09 ENCOUNTER — Telehealth: Payer: Self-pay | Admitting: Cardiovascular Disease

## 2012-08-09 DIAGNOSIS — M1710 Unilateral primary osteoarthritis, unspecified knee: Secondary | ICD-10-CM | POA: Diagnosis not present

## 2012-08-09 NOTE — Telephone Encounter (Signed)
Wants to know if he needs to take his dector for his pacemaker with him out of town... Its a [employer] product.. Please call as soon as you can .Marland Kitchen Will be going out of town .Marland Kitchen

## 2012-08-09 NOTE — Telephone Encounter (Signed)
Dr. Epimenio Sarin, LPN in [place] office today.  RN trying to get in contact with them.  No answer x 2.  Will try again soon.

## 2012-08-09 NOTE — Telephone Encounter (Signed)
Pt. To be contacted by [employer] rep concerning what needed to be done concerning his pace maker

## 2012-08-13 ENCOUNTER — Encounter (HOSPITAL_COMMUNITY): Payer: Medicare Other

## 2012-08-15 ENCOUNTER — Encounter (HOSPITAL_COMMUNITY): Payer: Medicare Other

## 2012-08-20 ENCOUNTER — Encounter (HOSPITAL_COMMUNITY): Payer: Medicare Other

## 2012-08-22 ENCOUNTER — Encounter (HOSPITAL_COMMUNITY): Payer: Medicare Other

## 2012-08-27 ENCOUNTER — Encounter (HOSPITAL_COMMUNITY): Payer: Medicare Other

## 2012-08-29 ENCOUNTER — Encounter (HOSPITAL_COMMUNITY): Payer: Medicare Other

## 2012-09-03 ENCOUNTER — Encounter (HOSPITAL_COMMUNITY): Payer: Medicare Other

## 2012-09-03 ENCOUNTER — Ambulatory Visit: Payer: Medicare Other | Admitting: Internal Medicine

## 2012-09-05 ENCOUNTER — Encounter (HOSPITAL_COMMUNITY): Payer: Medicare Other

## 2012-09-10 ENCOUNTER — Encounter (HOSPITAL_COMMUNITY): Payer: Medicare Other

## 2012-09-12 ENCOUNTER — Encounter (HOSPITAL_COMMUNITY): Payer: Medicare Other

## 2012-09-17 ENCOUNTER — Encounter (HOSPITAL_COMMUNITY): Payer: Medicare Other

## 2012-09-17 DIAGNOSIS — M48061 Spinal stenosis, lumbar region without neurogenic claudication: Secondary | ICD-10-CM | POA: Diagnosis not present

## 2012-09-17 DIAGNOSIS — Z96659 Presence of unspecified artificial knee joint: Secondary | ICD-10-CM | POA: Diagnosis not present

## 2012-09-17 DIAGNOSIS — Z471 Aftercare following joint replacement surgery: Secondary | ICD-10-CM | POA: Diagnosis not present

## 2012-09-17 DIAGNOSIS — M5126 Other intervertebral disc displacement, lumbar region: Secondary | ICD-10-CM | POA: Diagnosis not present

## 2012-09-18 ENCOUNTER — Other Ambulatory Visit: Payer: Self-pay | Admitting: *Deleted

## 2012-09-18 ENCOUNTER — Ambulatory Visit (INDEPENDENT_AMBULATORY_CARE_PROVIDER_SITE_OTHER): Payer: Medicare Other | Admitting: Pharmacist Clinician (PhC)/ Clinical Pharmacy Specialist

## 2012-09-18 ENCOUNTER — Other Ambulatory Visit: Payer: Self-pay | Admitting: Pharmacist Clinician (PhC)/ Clinical Pharmacy Specialist

## 2012-09-18 ENCOUNTER — Other Ambulatory Visit: Payer: Self-pay | Admitting: Cardiovascular Disease

## 2012-09-18 DIAGNOSIS — I509 Heart failure, unspecified: Secondary | ICD-10-CM | POA: Diagnosis not present

## 2012-09-18 DIAGNOSIS — R0602 Shortness of breath: Secondary | ICD-10-CM | POA: Diagnosis not present

## 2012-09-18 DIAGNOSIS — I471 Supraventricular tachycardia: Secondary | ICD-10-CM

## 2012-09-18 DIAGNOSIS — R6889 Other general symptoms and signs: Secondary | ICD-10-CM | POA: Diagnosis not present

## 2012-09-18 DIAGNOSIS — I4949 Other premature depolarization: Secondary | ICD-10-CM | POA: Diagnosis not present

## 2012-09-18 DIAGNOSIS — I4891 Unspecified atrial fibrillation: Secondary | ICD-10-CM | POA: Diagnosis not present

## 2012-09-18 DIAGNOSIS — Z7901 Long term (current) use of anticoagulants: Secondary | ICD-10-CM

## 2012-09-18 DIAGNOSIS — I495 Sick sinus syndrome: Secondary | ICD-10-CM | POA: Diagnosis not present

## 2012-09-18 DIAGNOSIS — R5381 Other malaise: Secondary | ICD-10-CM | POA: Diagnosis not present

## 2012-09-18 LAB — CBC WITH DIFFERENTIAL/PLATELET
Basophils Absolute: 0.1 10*3/uL (ref 0.0–0.1)
Eosinophils Relative: 5 % (ref 0–5)
HCT: 39.9 % (ref 39.0–52.0)
Lymphocytes Relative: 34 % (ref 12–46)
Lymphs Abs: 1.5 10*3/uL (ref 0.7–4.0)
MCV: 87.9 fL (ref 78.0–100.0)
Monocytes Absolute: 0.4 10*3/uL (ref 0.1–1.0)
Neutro Abs: 2.3 10*3/uL (ref 1.7–7.7)
Platelets: 228 10*3/uL (ref 150–400)
RBC: 4.54 MIL/uL (ref 4.22–5.81)
WBC: 4.5 10*3/uL (ref 4.0–10.5)

## 2012-09-18 LAB — COMPREHENSIVE METABOLIC PANEL
ALT: 21 U/L (ref 0–53)
AST: 32 U/L (ref 0–37)
CO2: 29 mEq/L (ref 19–32)
Chloride: 101 mEq/L (ref 96–112)
Creat: 1.21 mg/dL (ref 0.50–1.35)
Potassium: 4.2 mEq/L (ref 3.5–5.3)
Total Protein: 7.3 g/dL (ref 6.0–8.3)

## 2012-09-18 LAB — PACEMAKER DEVICE OBSERVATION

## 2012-09-18 LAB — POCT INR: INR: 3.6

## 2012-09-18 MED ORDER — WARFARIN SODIUM 5 MG PO TABS: ORAL_TABLET | ORAL | Status: DC

## 2012-09-18 MED ORDER — WARFARIN SODIUM 2.5 MG PO TABS: ORAL_TABLET | ORAL | Status: DC

## 2012-09-19 ENCOUNTER — Encounter (HOSPITAL_COMMUNITY): Payer: Medicare Other

## 2012-09-19 LAB — TSH: TSH: 2.39 u[IU]/mL (ref 0.350–4.500)

## 2012-09-26 ENCOUNTER — Telehealth: Payer: Self-pay | Admitting: Cardiovascular Disease

## 2012-09-26 ENCOUNTER — Encounter (HOSPITAL_COMMUNITY): Payer: Medicare Other

## 2012-09-26 NOTE — Telephone Encounter (Signed)
Message forwarded to J.C. Wildman, LPN.  

## 2012-09-26 NOTE — Telephone Encounter (Signed)
Wants Corey [name] to call him about some instructions he gave him please.

## 2012-09-26 NOTE — Telephone Encounter (Signed)
Pt. States hes been taking lopressor 50mg  two times a day until Dr. Alanda Amass changed it on his last visit. I told pt. I would explain this to Dr. Alanda Amass when I see him in the morning and i would call him back with an answer

## 2012-09-27 ENCOUNTER — Ambulatory Visit (INDEPENDENT_AMBULATORY_CARE_PROVIDER_SITE_OTHER): Payer: Medicare Other

## 2012-09-27 DIAGNOSIS — C61 Malignant neoplasm of prostate: Secondary | ICD-10-CM | POA: Diagnosis not present

## 2012-09-27 DIAGNOSIS — J309 Allergic rhinitis, unspecified: Secondary | ICD-10-CM | POA: Diagnosis not present

## 2012-09-28 ENCOUNTER — Other Ambulatory Visit: Payer: Self-pay | Admitting: Dermatology

## 2012-09-28 DIAGNOSIS — D1801 Hemangioma of skin and subcutaneous tissue: Secondary | ICD-10-CM | POA: Diagnosis not present

## 2012-09-28 DIAGNOSIS — L821 Other seborrheic keratosis: Secondary | ICD-10-CM | POA: Diagnosis not present

## 2012-09-28 DIAGNOSIS — Z85828 Personal history of other malignant neoplasm of skin: Secondary | ICD-10-CM | POA: Diagnosis not present

## 2012-09-28 DIAGNOSIS — C44319 Basal cell carcinoma of skin of other parts of face: Secondary | ICD-10-CM | POA: Diagnosis not present

## 2012-09-28 DIAGNOSIS — L570 Actinic keratosis: Secondary | ICD-10-CM | POA: Diagnosis not present

## 2012-09-28 DIAGNOSIS — L578 Other skin changes due to chronic exposure to nonionizing radiation: Secondary | ICD-10-CM | POA: Diagnosis not present

## 2012-10-01 ENCOUNTER — Encounter (HOSPITAL_COMMUNITY): Payer: Medicare Other

## 2012-10-02 ENCOUNTER — Ambulatory Visit (INDEPENDENT_AMBULATORY_CARE_PROVIDER_SITE_OTHER): Payer: Medicare Other | Admitting: Pharmacist Clinician (PhC)/ Clinical Pharmacy Specialist

## 2012-10-02 ENCOUNTER — Other Ambulatory Visit (HOSPITAL_COMMUNITY): Payer: Self-pay | Admitting: Cardiovascular Disease

## 2012-10-02 ENCOUNTER — Ambulatory Visit (HOSPITAL_COMMUNITY)
Admission: RE | Admit: 2012-10-02 | Discharge: 2012-10-02 | Disposition: A | Discharge status: Home / Self Care | Payer: Medicare Other | Source: Ambulatory Visit | Attending: Cardiovascular Disease | Admitting: Cardiovascular Disease

## 2012-10-02 VITALS — BP 136/82 | HR 64

## 2012-10-02 DIAGNOSIS — I255 Ischemic cardiomyopathy: Secondary | ICD-10-CM

## 2012-10-02 DIAGNOSIS — I509 Heart failure, unspecified: Secondary | ICD-10-CM | POA: Diagnosis not present

## 2012-10-02 DIAGNOSIS — I4891 Unspecified atrial fibrillation: Secondary | ICD-10-CM

## 2012-10-02 DIAGNOSIS — I428 Other cardiomyopathies: Secondary | ICD-10-CM | POA: Diagnosis not present

## 2012-10-02 DIAGNOSIS — G4730 Sleep apnea, unspecified: Secondary | ICD-10-CM | POA: Diagnosis not present

## 2012-10-02 DIAGNOSIS — Z7901 Long term (current) use of anticoagulants: Secondary | ICD-10-CM | POA: Diagnosis not present

## 2012-10-02 LAB — POCT INR: INR: 2.3

## 2012-10-02 NOTE — Progress Notes (Signed)
2D Echo Performed 10/02/2012    Atzel Mccambridge, RCS  

## 2012-10-03 ENCOUNTER — Encounter (HOSPITAL_COMMUNITY): Payer: Medicare Other

## 2012-10-04 DIAGNOSIS — C61 Malignant neoplasm of prostate: Secondary | ICD-10-CM | POA: Diagnosis not present

## 2012-10-08 ENCOUNTER — Encounter (HOSPITAL_COMMUNITY): Payer: Medicare Other

## 2012-10-10 ENCOUNTER — Encounter: Payer: Medicare Other | Admitting: Cardiology

## 2012-10-10 ENCOUNTER — Encounter (HOSPITAL_COMMUNITY): Payer: Medicare Other

## 2012-10-10 ENCOUNTER — Other Ambulatory Visit: Payer: Self-pay | Admitting: Cardiovascular Disease

## 2012-10-10 ENCOUNTER — Ambulatory Visit (INDEPENDENT_AMBULATORY_CARE_PROVIDER_SITE_OTHER): Payer: Medicare Other

## 2012-10-10 DIAGNOSIS — J309 Allergic rhinitis, unspecified: Secondary | ICD-10-CM | POA: Diagnosis not present

## 2012-10-10 DIAGNOSIS — I509 Heart failure, unspecified: Secondary | ICD-10-CM | POA: Diagnosis not present

## 2012-10-10 DIAGNOSIS — I4891 Unspecified atrial fibrillation: Secondary | ICD-10-CM | POA: Diagnosis not present

## 2012-10-10 DIAGNOSIS — I495 Sick sinus syndrome: Secondary | ICD-10-CM | POA: Diagnosis not present

## 2012-10-10 LAB — ICD DEVICE OBSERVATION
ATRIAL PACING ICD: 100 pct
BAMS-0003: 70 {beats}/min
DEV-0020ICD: NEGATIVE
FVT: 0
HV IMPEDENCE: 73 Ohm
LV LEAD THRESHOLD: 1 V
MODE SWITCH EPISODES: 0
PACEART VT: 0
RV LEAD IMPEDENCE ICD: 480 Ohm
RV LEAD THRESHOLD: 1.25 V
TZON-0003ATACH: 400 ms
TZON-0003SLOWVT: 340 ms

## 2012-10-10 LAB — PACEMAKER DEVICE OBSERVATION

## 2012-10-10 NOTE — Progress Notes (Signed)
Error

## 2012-10-15 ENCOUNTER — Encounter (HOSPITAL_COMMUNITY): Payer: Medicare Other

## 2012-10-16 ENCOUNTER — Telehealth: Payer: Self-pay | Admitting: Pharmacist Clinician (PhC)/ Clinical Pharmacy Specialist

## 2012-10-16 NOTE — Telephone Encounter (Signed)
Message forwarded to K. Alvstad, PharmD.  

## 2012-10-16 NOTE — Telephone Encounter (Signed)
Patient is very anxious to get his most recent INR.  He is having some light surgery tomorrow.

## 2012-10-16 NOTE — Telephone Encounter (Signed)
Patient is having some light surgery tomorrow and he needs his most recent INR.

## 2012-10-16 NOTE — Telephone Encounter (Signed)
Scheduled INR for tomorrow morning prior to dental work

## 2012-10-17 ENCOUNTER — Ambulatory Visit (INDEPENDENT_AMBULATORY_CARE_PROVIDER_SITE_OTHER): Payer: Medicare Other | Admitting: Pharmacist Clinician (PhC)/ Clinical Pharmacy Specialist

## 2012-10-17 ENCOUNTER — Ambulatory Visit: Payer: Medicare Other

## 2012-10-17 ENCOUNTER — Encounter (HOSPITAL_COMMUNITY): Payer: Medicare Other

## 2012-10-17 VITALS — BP 122/70 | HR 80

## 2012-10-17 DIAGNOSIS — Z7901 Long term (current) use of anticoagulants: Secondary | ICD-10-CM | POA: Diagnosis not present

## 2012-10-17 DIAGNOSIS — I4891 Unspecified atrial fibrillation: Secondary | ICD-10-CM

## 2012-10-17 LAB — POCT INR: INR: 2.7

## 2012-10-18 ENCOUNTER — Telehealth: Payer: Self-pay | Admitting: Cardiovascular Disease

## 2012-10-18 ENCOUNTER — Encounter: Payer: Self-pay | Admitting: Cardiovascular Disease

## 2012-10-18 ENCOUNTER — Other Ambulatory Visit: Payer: Self-pay | Admitting: Internal Medicine

## 2012-10-18 NOTE — Telephone Encounter (Signed)
Left message for patient to call and schedule 4 week coumadin appt around 10/22.

## 2012-10-22 ENCOUNTER — Encounter (HOSPITAL_COMMUNITY): Payer: Medicare Other

## 2012-10-22 DIAGNOSIS — Z85828 Personal history of other malignant neoplasm of skin: Secondary | ICD-10-CM | POA: Diagnosis not present

## 2012-10-22 DIAGNOSIS — L570 Actinic keratosis: Secondary | ICD-10-CM | POA: Diagnosis not present

## 2012-10-30 ENCOUNTER — Ambulatory Visit: Payer: Medicare Other | Admitting: Pharmacist Clinician (PhC)/ Clinical Pharmacy Specialist

## 2012-11-13 ENCOUNTER — Telehealth: Payer: Self-pay | Admitting: Internal Medicine

## 2012-11-13 DIAGNOSIS — G4733 Obstructive sleep apnea (adult) (pediatric): Secondary | ICD-10-CM

## 2012-11-13 NOTE — Telephone Encounter (Signed)
[email or web address]  

## 2012-11-13 NOTE — Telephone Encounter (Signed)
I spoke with the pt and he states he needs an order sent for a new cpap machine. He states he has had his since 2008 and it is not working properly. He took it to [facility] today and dropped it off and they could not get it to turn on. The pt states the issues he has been having is the water bubbles up through the mask and goes into his nose and the machine feels like it is over heating. It gets so hot he cannot touch it. Pt is current with his visits and follow-ups.  I have placed the order for a new machine to [facility]. Carron Curie, CMA

## 2012-11-14 ENCOUNTER — Ambulatory Visit (INDEPENDENT_AMBULATORY_CARE_PROVIDER_SITE_OTHER): Payer: Medicare Other

## 2012-11-14 ENCOUNTER — Other Ambulatory Visit: Payer: Self-pay | Admitting: *Deleted

## 2012-11-14 DIAGNOSIS — J309 Allergic rhinitis, unspecified: Secondary | ICD-10-CM | POA: Diagnosis not present

## 2012-11-14 MED ORDER — METOPROLOL SUCCINATE ER 50 MG PO TB24: ORAL_TABLET | ORAL | Status: DC

## 2012-11-14 NOTE — Telephone Encounter (Signed)
Rx was sent to pharmacy electronically. 

## 2012-11-17 LAB — ICD DEVICE OBSERVATION

## 2012-11-19 ENCOUNTER — Encounter: Payer: Self-pay | Admitting: Internal Medicine

## 2012-11-19 ENCOUNTER — Ambulatory Visit (INDEPENDENT_AMBULATORY_CARE_PROVIDER_SITE_OTHER): Payer: Medicare Other | Admitting: Internal Medicine

## 2012-11-19 ENCOUNTER — Ambulatory Visit (INDEPENDENT_AMBULATORY_CARE_PROVIDER_SITE_OTHER): Payer: Medicare Other

## 2012-11-19 VITALS — BP 122/76 | HR 72 | Ht 72.0 in | Wt 187.6 lb

## 2012-11-19 DIAGNOSIS — C44319 Basal cell carcinoma of skin of other parts of face: Secondary | ICD-10-CM | POA: Diagnosis not present

## 2012-11-19 DIAGNOSIS — I5022 Chronic systolic (congestive) heart failure: Secondary | ICD-10-CM | POA: Diagnosis not present

## 2012-11-19 DIAGNOSIS — J309 Allergic rhinitis, unspecified: Secondary | ICD-10-CM

## 2012-11-19 DIAGNOSIS — Z85828 Personal history of other malignant neoplasm of skin: Secondary | ICD-10-CM | POA: Diagnosis not present

## 2012-11-19 DIAGNOSIS — G4733 Obstructive sleep apnea (adult) (pediatric): Secondary | ICD-10-CM

## 2012-11-19 DIAGNOSIS — I4891 Unspecified atrial fibrillation: Secondary | ICD-10-CM

## 2012-11-19 DIAGNOSIS — J302 Other seasonal allergic rhinitis: Secondary | ICD-10-CM

## 2012-11-19 NOTE — Patient Instructions (Signed)
Order- [facility]- replacement for worn out CPAP machine, mask of choice and supplies                                       Autotitrate 5-15 cwp x 7 days for pressure recommendation  We can continue allergy vaccine 1:10 GH  Please call as needed

## 2012-11-19 NOTE — Progress Notes (Signed)
11/01/10-77 year old male with remote smoking history, followed for chronic asthmatic bronchitis/bronchiectasis, history of sinusitis, allergic rhinitis, sleep apnea Last here 03/15/10 He wants to wait until November for flu shot. He has not had recent or acute symptoms related to his breathing and has not used Advair in 6 months. He didn't find it helpful. Rare use of Proair, maybe once every 3-4 months. He continues allergy vaccine, missing a dose sometimes if he is away from town. He notices no problems and offers no questions. He continues to use CPAP all night every night. He had had a palatoplasty he doesn't remember when and wonders if this was done at the time of his tonsillectomy as a Camisha Srey child. He notices persistent narrowness through the right nostril related to his septal deviation. Nasonex helps. We discussed a trial of Breathe Right strips to be used under his CPAP mask. He can feel when his atrial fibrillation is more active, as in the past 2 days. He treats his difficulty initiating and maintaining sleep with a one half of a 5 mg Ambien tablet when needed.   11/01/10-77 year old male with remote smoking history, followed for chronic asthmatic bronchitis/bronchiectasis, history of sinusitis, allergic rhinitis, sleep apnea Walked in today as an acute unscheduled visit complaining of cough since lunchtime. He got a throat tickle walking to his car and got gradually worse. Persistent for 4 hours with chest congestion. Chronic feeling of raspy throat clearing. Orthopedic knee surgery one month ago/Dr. Tonita Cong. Oxycodone given in hospital cause shaking, insomnia, panic attack. When he got home he took some leftover hydrocodone and had the same experience, up all night, pacing and anxious. This is set off his chronic anxiety. Dr Tonita Cong called in lorazepam- helps. Continues CPAP all night every night.  05/16/11- 77 year old male with remote smoking history, followed for chronic asthmatic  bronchitis/bronchiectasis, history of sinusitis, allergic rhinitis, sleep apnea, AFib/ CAD/ MI, GERD He admits his anxiety is getting to him. 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 Apparently he was put to sleep with intention to remove the lesion but they found they could not. He is left persistently hoarse.  He had just had back surgery 8 weeks previously and has been taking oxycodone for back pain. Lorazepam helps him sleep at night.  CPAP continues all night every night with good control. 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  11/03/11- 77 year old male with remote smoking history, followed for chronic asthmatic bronchitis/bronchiectasis, history of sinusitis, allergic rhinitis, sleep apnea, AFib/ CAD/ MI, GERD He wants to wait until November for flu shot. Continues allergy vaccine 1:10 GH. He has not had much respiratory problem until the last one or 2 weeks when he began throat clearing. Has used his rescue inhaler only once or twice. In February he had spine surgery for spinal stenosis. Oxycodone caused panic attack lasting 3 months. Then had a vocal cord biopsy at [facility]. Exacerbation of atrial fibrillation treated with pacemaker in June, then amiodarone. He still feels more short of breath than normal but is starting cardiac rehabilitation. CPAP/ [facility].  12/26/11-  77 year old male with remote smoking history, followed for chronic asthmatic bronchitis/bronchiectasis, history of sinusitis, allergic rhinitis, sleep apnea, AFib/ CAD/ MI, GERD     Wife here FOLLOWS FOR: on last dose of Amoxicillin(dx'd with PNA at hospital 12-16-11; feels somewhat better but not 100%.  Still on allergy vaccine. Acute  illness is now improving. Antibiotic ends today. No fever, minimal phlegm, throat tickle, shortness of breath with sustained speech. Denies cough or wheeze. CXR  12/18/11- reviewed IMPRESSION:  Improved right lower lobe airspace disease. Small right effusion  noted.  Original Report Authenticated By: Holley Dexter, M.D.    03/29/2012 Acute OV  Complains of sneezing, runny nose with clear drainage, watery eyes, increased saliva x 6 months .  Has multiple complaints since surgery last year for pacemaker and back surgery.  Has significant drippy nose and post nasal drainage. Mainly clear drainage. Has to clear throat.  Has dry cough on/off  Does gets choked on saliva and thin liquids.  No significant dysphagia at present. Marland Kitchen Has had previous esophageal dilatation.  Has excessive saliva intermittently.  Gets weekly allergy shots.  Started on Dymista -did not use but once because had a headache.  No discolored mucus, fever, chest pain or edema.  >>rec to stop ACE , rx claritin   04/30/2012 Acute OV  Complains of prod cough with yellow mucus, increased SOB x 8 days.  Recently on vacation on cruise.  Patient reports that he started coughing. Prior to beginning his vacation. And cough is progressively gotten worse. He's been taking over-the-counter cold products, and Tylenol without much relief. He denies any hemoptysis, orthopnea, chest pain, palpitations, syncope Patient was recommended to have his ACE inhibitor  Change at last visit. However, patient reports that his doctor does not feel like this is causing his cough and his dose was increased.  05/16/12- 77 year old male with remote smoking history, followed for chronic asthmatic bronchitis/bronchiectasis, history of sinusitis, allergic rhinitis, sleep apnea, AFib/ CAD/ MI, GERD   FOLLOWS FOR: still on vaccine and doing well; no major flare ups at this time. Describes pain in the left mid back intermittent x10 years. His primary physician has thought it was arthritis and referred for physical therapy which starts tomorrow. History of spinal stenosis surgery. He had an acute bronchitis at last office  visit, responding to Augmentin, prednisone and Mucinex DM. He continues allergy vaccine 1:10 GH. CXR 05/01/12 *RADIOLOGY REPORT*  Clinical Data: Cough and shortness of breath.  CHEST - 2 VIEW  Comparison: 01/17/2012  Findings: Stable appearance of the left cardiac ICD leads. The  lungs are clear without airspace disease or edema. Stable  appearance of the heart and mediastinum. Stable densities or  calcifications in the left mid lung region.  IMPRESSION:  No active cardiopulmonary disease.  Original Report Authenticated By: Richarda Overlie, M.D.  11/19/12- 77 year old male with remote smoking history, followed for chronic asthmatic bronchitis/bronchiectasis, history of sinusitis, allergic rhinitis, OSA/ CPAP/ UPPP, AFib/ CAD/ MI, GERD   FOLLOWS FOR: still on vaccine-has missed 2 weeks due to traveling. DME is [facility]. Wears CPAP every night for about 8-9 hours; pressure working well for patient. Okay with CPAP/[facility] 5-15. Old machine has burned out and needs replacement Continues allergy vaccine 1:10 GH  ROS-see HPI Constitutional:   No-   weight loss, night sweats, fevers, chills, fatigue, lassitude. HEENT:   No-  headaches, difficulty swallowing, tooth/dental problems, sore throat,       No-  sneezing, itching, ear ache, nasal congestion, post nasal drip,  CV:  No-   chest pain, orthopnea, PND, swelling in lower extremities, anasarca,  dizziness, palpitations Resp: No-   shortness of breath with exertion or at rest.              No-   productive cough,  No non-productive cough,  No- coughing up of blood.              No-   change in color of mucus.  No- wheezing.   Skin: No-   rash or lesions. GI:  No-   heartburn, indigestion, abdominal pain, nausea, vomiting, GU:  MS:  No-   joint pain or swelling.  .  + back pain. Neuro-     nothing unusual Psych:  No- change in mood or affect. No depression or anxiety.  No memory loss.  OBJ- Physical Exam General- Alert, Oriented,  Affect-appropriate, Distress- none acute Skin- rash-none, lesions- none, excoriation- none. +Bandage over nose- Moh's surgery Lymphadenopathy- none Head- atraumatic            Eyes- Gross vision intact, PERRLA, conjunctivae and secretions clear            Ears- Hearing, canals-normal            Nose- Clear, no-Septal dev, mucus, polyps, erosion, perforation             Throat- + UPPP/ Mallampati II , +frequent throat clearing without visible drainage , drainage- none, tonsils- atrophic Neck- flexible , trachea midline, no stridor , thyroid nl, carotid no bruit Chest - symmetrical excursion , unlabored           Heart/CV- RRR , no murmur , no gallop  , no rub, nl s1 s2                           - JVD- none , edema- none, stasis changes- none, varices- none           Lung- +basilar crackles, wheeze- none, cough- none , dullness-none, rub- none           Chest wall- L pacemaker Abd- Br/ Gen/ Rectal- Not done, not indicated Extrem- cyanosis- none, clubbing, none, atrophy- none, strength- nl Neuro- grossly intact to observation

## 2012-11-20 ENCOUNTER — Ambulatory Visit: Payer: Medicare Other | Admitting: Pharmacist Clinician (PhC)/ Clinical Pharmacy Specialist

## 2012-11-20 ENCOUNTER — Telehealth: Payer: Self-pay | Admitting: *Deleted

## 2012-11-20 DIAGNOSIS — Z96659 Presence of unspecified artificial knee joint: Secondary | ICD-10-CM | POA: Diagnosis not present

## 2012-11-20 DIAGNOSIS — M5126 Other intervertebral disc displacement, lumbar region: Secondary | ICD-10-CM | POA: Diagnosis not present

## 2012-11-20 DIAGNOSIS — M25569 Pain in unspecified knee: Secondary | ICD-10-CM | POA: Diagnosis not present

## 2012-11-20 NOTE — Telephone Encounter (Signed)
Message copied by Darrell Jewel on Tue Nov 20, 2012  9:46 AM ------      Message from: Henderson Newcomer      Created: Mon Nov 19, 2012  9:37 AM       Corey [name]. This pt came into the store last week and we provided him with a loaner machine until we can gather all information needed for his replacement.  Bjorn Loser is looking for his sleep study and the only other thing we will need is the pressure settings on the order.       Could you put those on the order?  The last order we have on file was for Auto  5-20cmH2O.      Thanks!      ----- Message -----         From: Darrell Jewel, CMA         Sent: 11/13/2012   5:01 PM           To: Melissa Stenson            Order placed.        ------

## 2012-11-20 NOTE — Telephone Encounter (Signed)
Order placed at time of visit on 11-19-12. Carron Curie, CMA

## 2012-11-22 ENCOUNTER — Other Ambulatory Visit: Payer: Self-pay

## 2012-11-22 ENCOUNTER — Ambulatory Visit (INDEPENDENT_AMBULATORY_CARE_PROVIDER_SITE_OTHER): Payer: Medicare Other

## 2012-11-22 DIAGNOSIS — J309 Allergic rhinitis, unspecified: Secondary | ICD-10-CM | POA: Diagnosis not present

## 2012-11-22 MED ORDER — RAMIPRIL 10 MG PO CAPS: 20.0000 mg | ORAL_CAPSULE | Freq: Every day | ORAL | Status: DC

## 2012-11-22 NOTE — Telephone Encounter (Signed)
Rx was sent to pharmacy electronically. 

## 2012-11-24 ENCOUNTER — Other Ambulatory Visit: Payer: Self-pay | Admitting: Internal Medicine

## 2012-11-28 ENCOUNTER — Other Ambulatory Visit: Payer: Self-pay | Admitting: Internal Medicine

## 2012-11-28 ENCOUNTER — Encounter: Payer: Self-pay | Admitting: *Deleted

## 2012-11-28 LAB — REMOTE ICD DEVICE
AL AMPLITUDE: 2.4 mv
ATRIAL PACING ICD: 99 pct
CHARGE TIME: 9.1 s
MODE SWITCH EPISODES: 0
PACEART VT: 0
RV LEAD AMPLITUDE: 11.1 mv
TZON-0003ATACH: 400 ms
TZON-0005SLOWVT: 6
VENTRICULAR PACING ICD: 99 pct

## 2012-12-02 NOTE — Assessment & Plan Note (Signed)
Good compliance and control. Needs replacement for worn out machine

## 2012-12-02 NOTE — Assessment & Plan Note (Signed)
Plan-continue allergy vaccine 

## 2012-12-03 ENCOUNTER — Other Ambulatory Visit: Payer: Self-pay | Admitting: *Deleted

## 2012-12-03 ENCOUNTER — Telehealth: Payer: Self-pay | Admitting: Internal Medicine

## 2012-12-03 DIAGNOSIS — R413 Other amnesia: Secondary | ICD-10-CM

## 2012-12-03 MED ORDER — SPIRONOLACTONE 25 MG PO TABS: 25.0000 mg | ORAL_TABLET | Freq: Every day | ORAL | Status: DC

## 2012-12-03 NOTE — Telephone Encounter (Signed)
Pt is having trouble remembering things. Pt is forgetful,misplacing thing ?dementia or alzheimer. Pt wife would like a referral to neurologist

## 2012-12-07 NOTE — Telephone Encounter (Signed)
Referral order placed.

## 2012-12-07 NOTE — Telephone Encounter (Signed)
ok 

## 2012-12-11 ENCOUNTER — Telehealth: Payer: Self-pay | Admitting: Internal Medicine

## 2012-12-11 NOTE — Telephone Encounter (Signed)
Noted- we can reassess as needed

## 2012-12-11 NOTE — Telephone Encounter (Signed)
I called and spoke with pt. He was in too see dr. Maple Hudson on 11/19/12. Pt received a new cpap machine. He used it parts for 2-3 nights for about 2 hrs. He woke up choking and coughing. He decided to go back to using his old machine. Has been on this for 2 weeks nows. He reports he is going to continue using his old machine until he no longer has too. He is not sure of the settings on this machine. Will forward to Dr. Maple Hudson so he is aware.

## 2012-12-13 ENCOUNTER — Ambulatory Visit (INDEPENDENT_AMBULATORY_CARE_PROVIDER_SITE_OTHER): Payer: Medicare Other

## 2012-12-13 DIAGNOSIS — J309 Allergic rhinitis, unspecified: Secondary | ICD-10-CM | POA: Diagnosis not present

## 2012-12-15 LAB — ICD DEVICE OBSERVATION

## 2012-12-17 ENCOUNTER — Ambulatory Visit: Payer: Medicare Other

## 2012-12-17 LAB — MDC_IDC_ENUM_SESS_TYPE_REMOTE
Brady Statistic RV Percent Paced: 99 %
HighPow Impedance: 73 Ohm
Implantable Pulse Generator Serial Number: 7057550
Lead Channel Impedance Value: 580 Ohm
Lead Channel Sensing Intrinsic Amplitude: 11.6 mV
Lead Channel Setting Pacing Amplitude: 2 V
Lead Channel Setting Pacing Pulse Width: 0.5 ms
Lead Channel Setting Sensing Sensitivity: 0.5 mV
Zone Setting Detection Interval: 340 ms
Zone Setting Detection Interval: 400 ms

## 2012-12-19 ENCOUNTER — Encounter: Payer: Medicare Other | Admitting: *Deleted

## 2012-12-22 ENCOUNTER — Other Ambulatory Visit: Payer: Self-pay | Admitting: Internal Medicine

## 2012-12-24 ENCOUNTER — Ambulatory Visit (INDEPENDENT_AMBULATORY_CARE_PROVIDER_SITE_OTHER): Payer: Medicare Other

## 2012-12-24 DIAGNOSIS — J309 Allergic rhinitis, unspecified: Secondary | ICD-10-CM | POA: Diagnosis not present

## 2012-12-26 ENCOUNTER — Other Ambulatory Visit: Payer: Self-pay | Admitting: Internal Medicine

## 2012-12-28 ENCOUNTER — Encounter: Payer: Self-pay | Admitting: *Deleted

## 2012-12-31 ENCOUNTER — Encounter: Payer: Self-pay | Admitting: Internal Medicine

## 2012-12-31 ENCOUNTER — Ambulatory Visit (INDEPENDENT_AMBULATORY_CARE_PROVIDER_SITE_OTHER): Payer: Medicare Other | Admitting: Internal Medicine

## 2012-12-31 VITALS — BP 142/86 | HR 84 | Temp 97.5°F | Ht 72.0 in | Wt 192.0 lb

## 2012-12-31 DIAGNOSIS — N644 Mastodynia: Secondary | ICD-10-CM | POA: Diagnosis not present

## 2012-12-31 NOTE — Progress Notes (Signed)
Pre visit review using our clinic review tool, if applicable. No additional management support is needed unless otherwise documented below in the visit note. 

## 2012-12-31 NOTE — Progress Notes (Signed)
One year hx of breast tenderness. Has been on spironolactone for > 1 year (at least by our records).  Wife is concerned about forgetfulness. His daughters are also concerned (per wife's report).   Wife would like him to be checked into a hospital for a complete review. He has no acute sxs or concerns.    Reviewed past medical history, problem list and allergies.  Review of systems no other complaints specifically breast tenderness.  Examination: Appears well. Chest clear to auscultation. Palpation of the breasts without any masses.  A/P Breast tenderness- due to spironolactone. Continue same medications.  Memory loss (?)- to see neurology.   Will be happy to assist with appointment at [facility] for "complete review".

## 2013-01-01 ENCOUNTER — Encounter: Payer: Self-pay | Admitting: *Deleted

## 2013-01-02 ENCOUNTER — Ambulatory Visit (INDEPENDENT_AMBULATORY_CARE_PROVIDER_SITE_OTHER): Payer: Medicare Other | Admitting: Pharmacist

## 2013-01-02 ENCOUNTER — Ambulatory Visit (INDEPENDENT_AMBULATORY_CARE_PROVIDER_SITE_OTHER): Payer: Medicare Other | Admitting: Physician Assistant

## 2013-01-02 ENCOUNTER — Encounter: Payer: Self-pay | Admitting: Internal Medicine

## 2013-01-02 ENCOUNTER — Encounter: Payer: Self-pay | Admitting: Physician Assistant

## 2013-01-02 VITALS — BP 100/62 | HR 60 | Ht 73.0 in | Wt 191.8 lb

## 2013-01-02 DIAGNOSIS — E785 Hyperlipidemia, unspecified: Secondary | ICD-10-CM | POA: Diagnosis not present

## 2013-01-02 DIAGNOSIS — I2510 Atherosclerotic heart disease of native coronary artery without angina pectoris: Secondary | ICD-10-CM

## 2013-01-02 DIAGNOSIS — I4891 Unspecified atrial fibrillation: Secondary | ICD-10-CM

## 2013-01-02 DIAGNOSIS — I428 Other cardiomyopathies: Secondary | ICD-10-CM

## 2013-01-02 DIAGNOSIS — I5022 Chronic systolic (congestive) heart failure: Secondary | ICD-10-CM

## 2013-01-02 DIAGNOSIS — I10 Essential (primary) hypertension: Secondary | ICD-10-CM | POA: Diagnosis not present

## 2013-01-02 DIAGNOSIS — Z7901 Long term (current) use of anticoagulants: Secondary | ICD-10-CM

## 2013-01-02 DIAGNOSIS — Z950 Presence of cardiac pacemaker: Secondary | ICD-10-CM

## 2013-01-02 LAB — BASIC METABOLIC PANEL
CO2: 29 mEq/L (ref 19–32)
Calcium: 9 mg/dL (ref 8.4–10.5)
Glucose, Bld: 82 mg/dL (ref 70–99)
Sodium: 138 mEq/L (ref 135–145)

## 2013-01-02 NOTE — Patient Instructions (Signed)
You have been referred to [facility] TO EST. DX 427.31; PREVIOUSLY SEEN BY [facility]  LAB WORK TODAY; BMET  KEEP YOUR APPT WITH DR. ALLRED 02/04/13  YOU WILL NEED TO EST. WITH DEVICE CLINIC FOR FOLLOW UP ON YOUR BI V-ICD; PREVIOUSLY WAS HAVING [facility] CHECK DEVICE  NO CHANGES WITH YOUR MEDICATIONS TODAY

## 2013-01-02 NOTE — Progress Notes (Signed)
[address] [place], [place]  [address] Phone: [phone] Fax:  [phone]  Date:  01/02/2013   ID:  Corey [name], DOB 22-Nov-1935, MRN [id number]  PCP:  Judie Petit, MD  Cardiologist:  Dr. Alanda Amass => Dr. Hillis Range   Electrophysiologist:  Dr. Hillis Range    History of Present Illness: Corey [name] is a 77 y.o. male with a hx of NICM with EF 25%, systolic CHF, CAD, HL, AFib, sleep apnea, intermittent LBBB, tachy-brady syndrome s/p pacemaker in 06/2011 with upgrade to CRT-D (St. Jude model 3265) device in 12/2011.  LHC (07/2011): LAD with luminal irregularities, proximal circumflex 50%, EF 25%.  Follow up echo (10/02/12): EF 35-40%, grade 1 diastolic dysfunction, trivial AI, mild MR, mild RVE.  Last seen by Dr. Johney Frame 05/29/12.  In light of Dr. Kandis Cocking retirement, [name] would like to continue to follow with Dr. Hillis Range and presents today for follow up.  He has had marked improvement since implantation of his CRT-D device. As noted, his ejection fraction has improved. His functional class is improved. He currently describes NYHA class II symptoms. He denies chest discomfort. He denies orthopnea, PND or edema. He denies syncope. He denies ICD therapies. He has not had his Coumadin checked in several weeks.  Recent Labs: 07/23/2012: HDL 48.00; LDL (calc) 77  0/27/2536: ALT 21; Creatinine 1.21; Hemoglobin 13.5; Potassium 4.2; TSH 2.390   Wt Readings from Last 3 Encounters:  01/02/13 191 lb 12.8 oz (87 kg)  12/31/12 192 lb (87.091 kg)  11/19/12 187 lb 9.6 oz (85.095 kg)     Past Medical History  Diagnosis Date  . Alcohol abuse, episodic 05/19/2008  . ALLERGIC RHINITIS 04/15/2009  . COLONIC POLYPS, ADENOMATOUS, HX OF 02/14/2008  . DIVERTICULOSIS, COLON 02/14/2008  . ESOPHAGEAL STRICTURE 01/28/2008  . GERD 02/14/2008  . HYPERGLYCEMIA 11/16/2006  . HYPERLIPIDEMIA 02/14/2008  . INSOMNIA-SLEEP DISORDER-UNSPEC 02/11/2009  . 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 PROSTATE CANCER, HX OF 02/25/2000  . TRANSIENT ISCHEMIC ATTACKS, HX OF 11/16/2006  . Arthritis   . Atrial fibrillation 11/16/2006    remote CHF related to atrial fib with rapid ventricular response over 25 yrs per office note,  . ASTHMA 11/16/2006    sinusitis-    hx ?yeast patch/white patch on vocal cord as per [facility] 02/25/11-   . OBSTRUCTIVE SLEEP APNEA 11/10/2008  . SLEEP APNEA 10/03/2009    LOV Dr Maple Hudson 12/12 in EPIC    Moderate per patient- settings ?6/last sleep study years ago  . HYPERTENSION 11/16/2006  . Symptomatic bradycardia, secondary to sinus node dysfunction 07/08/2011    s/p [facility] Scientific PPM by Dr Royann Shivers, upgraded to BiV ICD 12/13 by Dr Johney Frame ([facility])  . Myocardial infarction 1993  . Pacemaker     St. Jude  . ICD (implantable cardiac defibrillator) in place   . Anxiety   . Depression   . Pneumonia     hx of  . Headache(784.0)   . Cancer 2002    prostate cancer  . CORONARY ARTERY DISEASE 11/16/2006    LHC (07/2011): LAD with luminal irregularities, proximal circumflex 50%, EF 25%  . Chronic systolic CHF (congestive heart failure)     echo (10/02/12): EF 35-40%, grade 1 diastolic dysfunction, trivial AI, mild MR, mild RVE  . NICM (nonischemic cardiomyopathy)     Current Outpatient Prescriptions  Medication Sig Dispense Refill  . acetaminophen (TYLENOL) 500 MG tablet Take 500 mg by mouth every 6 (six) hours  as needed for pain. Per bottle as needed for pain      . albuterol (PROVENTIL HFA;VENTOLIN HFA) 108 (90 BASE) MCG/ACT inhaler Inhale 2 puffs into the lungs every 6 (six) hours as needed for shortness of breath.       Marland Kitchen amiodarone (PACERONE) 200 MG tablet Take 200 mg by mouth daily.       Marland Kitchen aspirin EC 81 MG tablet Take 81 mg by mouth every evening.       Marland Kitchen LORazepam (ATIVAN) 0.5 MG tablet Take 0.5 mg by mouth every evening.       . metoprolol succinate (TOPROL-XL) 50 MG 24 hr tablet Take 1 tablet (50mg ) in morning and take 1/2 tablet (25mg ) in  evening.  45 tablet  11  . NASONEX 50 MCG/ACT nasal spray TWO SPRAYS IN EACH NOSTRIL ONCE DAILY  17 g  0  . pantoprazole (PROTONIX) 40 MG tablet TAKE ONE TABLET BY MOUTH ONCE DAILY  30 tablet  2  . polyethylene glycol (MIRALAX / GLYCOLAX) packet Take 8.5 g by mouth daily as needed.      . ramipril (ALTACE) 10 MG capsule Take 2 capsules (20 mg total) by mouth daily.  60 capsule  6  . rosuvastatin (CRESTOR) 5 MG tablet Take 5 mg by mouth daily with breakfast.      . sertraline (ZOLOFT) 50 MG tablet Take 1 tablet (50 mg total) by mouth daily.  30 tablet  3  . spironolactone (ALDACTONE) 25 MG tablet Take 1 tablet (25 mg total) by mouth daily.  30 tablet  6  . warfarin (COUMADIN) 2.5 MG tablet Take 1 tablet by mouth daily or as directed  30 tablet  3  . zolpidem (AMBIEN) 5 MG tablet TAKE ONE TABLET AT BEDTIME AS NEEDED  30 tablet  3   No current facility-administered medications for this visit.    Allergies:   Hydrocodone and Oxycodone   Social History:  The patient  reports that he quit smoking about 48 years ago. His smoking use included Cigarettes. He has a 4 pack-year smoking history. He has never used smokeless tobacco. He reports that he drinks alcohol. He reports that he does not use illicit drugs.   Family History:  The patient's family history includes Heart attack in his brother, father, and maternal uncle; Heart disease in his maternal uncle. There is no history of Colon cancer.   ROS:  Please see the history of present illness.   He has chronic LBP.  He has chronic constipation with occ BRBPR from straining.  He denies melena, hematuria.   All other systems reviewed and negative.   PHYSICAL EXAM: VS:  BP 100/62  Pulse 60  Ht 6\' 1"  (1.854 m)  Wt 191 lb 12.8 oz (87 kg)  BMI 25.31 kg/m2 Well nourished, well developed, in no acute distress HEENT: normal Neck: no JVD Vascular: No carotid bruits Cardiac:  normal S1, S2; RRR; no murmur Lungs:  clear to auscultation bilaterally, no  wheezing, rhonchi or rales Abd: soft, nontender, no hepatomegaly Ext: no edema Skin: warm and dry Neuro:  CNs 2-12 intact, no focal abnormalities noted  EKG:  AV paced, HR 65     ASSESSMENT AND PLAN:  1. Chronic Systolic CHF: Volume appears stable. Continue current therapy. Check followup basic metabolic panel today.  He is NYHA class II. 2. Nonischemic Cardiomyopathy: He is on a good medical regimen which includes beta blocker, ACEI, spironolactone. He has noted some breast tenderness. He was seen  by his PCP but feels comfortable continuing this medication for now. Recent ejection fraction improved. 3. CAD: No angina. Continue aspirin, statin. 4. Atrial Fibrillation: He is maintaining NSR. He remains on amiodarone. TSH and LFTs in 08/2012 were normal. 5. Hypertension: Controlled. 6. Hyperlipidemia: Continue statin. 7. Disposition: He already has follow up with Dr. Johney Frame next month. He prefers to keep this appointment. I will arrange follow up in our Coumadin clinic. I will also arrange follow up with EP for monitoring of his device.  Signed, Tereso Newcomer, PA-C  01/02/2013 1:48 PM

## 2013-01-08 ENCOUNTER — Ambulatory Visit: Payer: Medicare Other | Admitting: Neurology

## 2013-01-09 ENCOUNTER — Encounter: Payer: Self-pay | Admitting: Neurology

## 2013-01-09 ENCOUNTER — Ambulatory Visit (INDEPENDENT_AMBULATORY_CARE_PROVIDER_SITE_OTHER): Payer: Medicare Other | Admitting: Neurology

## 2013-01-09 VITALS — BP 118/70 | HR 78 | Temp 97.5°F | Ht 73.0 in | Wt 192.0 lb

## 2013-01-09 DIAGNOSIS — G3184 Mild cognitive impairment, so stated: Secondary | ICD-10-CM

## 2013-01-09 DIAGNOSIS — F09 Unspecified mental disorder due to known physiological condition: Secondary | ICD-10-CM | POA: Diagnosis not present

## 2013-01-09 DIAGNOSIS — R413 Other amnesia: Secondary | ICD-10-CM | POA: Diagnosis not present

## 2013-01-09 DIAGNOSIS — R4189 Other symptoms and signs involving cognitive functions and awareness: Secondary | ICD-10-CM

## 2013-01-09 LAB — VITAMIN B12: Vitamin B-12: 285 pg/mL (ref 211–911)

## 2013-01-09 MED ORDER — DONEPEZIL HCL 5 MG PO TABS: 5.0000 mg | ORAL_TABLET | Freq: Every day | ORAL | Status: DC

## 2013-01-09 NOTE — Progress Notes (Signed)
NEUROLOGY CONSULTATION NOTE  DEVERY MURGIA MRN: [id number] DOB: 1935/04/09  Referring provider: Dr. Cato Mulligan Primary care provider: Dr. Cato Mulligan  Reason for consult:  Memory problems.  HISTORY OF PRESENT ILLNESS: Corey [name] is a 77 year old right-handed man with history of atrial fibrillation with PM, MI and CAD, TIA, hypertension, OSA, hyperlipidemia, alcohol abuse, and history of prostate cancer who presents for memory problems.  He is accompanied by his wife.  Records and images were personally reviewed where available.    His wife and daughters note some mild memory problems for a little while, but has significantly been more noticeable over the past 5 months, since he retired in June.  Memory has been the primary problem.  He was called his daughters several times in a day asking about the same thing.  He still drives but is now mildly disoriented driving around home.  There has been no accidents or near-accidents, but he is more irritable and impulsive, and will drive a little faster and tail gate.  He has poor night vision.  They have a new grandson.  Recently, he forgot about a party for his new grandchild and instead made plans with friends.  He has begun to forget names of family and friends.  He has not had any significant change in personality other than irritability.  Besides the aggressive driving, he uses foul language a little more.  He does not act inappropriately.  He does feel depressed, as he has been going through an adjustment since retiring.  He has had more falls recently.  One time, the rug slipped from underneath him.  Another time, he tripped over a hose at the gas station.  He also had experienced bowel incontinence.  He has not had any problems at work prior to retirement.  He has not had hallucinations or delusions. He has not had hallucinations or delusions.  He has not had difficulty performing everyday tasks.  He notes some problems but doesn't think it is as  bad as his family makes it out to be.    He worked in [employer] for many years.  Since retirement, he typically doesn't do too much.  He will sit around.  He doesn't watch much TV during the day.  He reads the newspaper daily and able to retain information.  He is able to stay focus.  He and his wife are not as social as they used to be.    His mother had dementia.   Over the past year or so, he has had several operations, including pacemaker and back surgery.  He attributes these changes to his multiple surgeries.  09/18/12: TSH 2.390, T4 1.5.  PAST MEDICAL HISTORY: Past Medical History  Diagnosis Date  . Alcohol abuse, episodic 05/19/2008  . ALLERGIC RHINITIS 04/15/2009  . COLONIC POLYPS, ADENOMATOUS, HX OF 02/14/2008  . DIVERTICULOSIS, COLON 02/14/2008  . ESOPHAGEAL STRICTURE 01/28/2008  . GERD 02/14/2008  . HYPERGLYCEMIA 11/16/2006  . HYPERLIPIDEMIA 02/14/2008  . INSOMNIA-SLEEP DISORDER-UNSPEC 02/11/2009  . 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 PROSTATE CANCER, HX OF 02/25/2000  . TRANSIENT ISCHEMIC ATTACKS, HX OF 11/16/2006  . Arthritis   . Atrial fibrillation 11/16/2006    remote CHF related to atrial fib with rapid ventricular response over 25 yrs per office note,  . ASTHMA 11/16/2006    sinusitis-    hx ?yeast patch/white patch on vocal cord as per [facility] 02/25/11-   . OBSTRUCTIVE SLEEP APNEA 11/10/2008  . SLEEP APNEA 10/03/2009  LOV Dr Maple Hudson 12/12 in [facility]    Moderate per patient- settings ?6/last sleep study years ago  . HYPERTENSION 11/16/2006  . Symptomatic bradycardia, secondary to sinus node dysfunction 07/08/2011    s/p [facility] [employer] PPM by Dr Royann Shivers, upgraded to BiV ICD 12/13 by Dr Johney Frame (SJM)  . Myocardial infarction 1993  . Pacemaker     St. Jude  . ICD (implantable cardiac defibrillator) in place   . Anxiety   . Depression   . Pneumonia     hx of  . Headache(784.0)   . Cancer 2002    prostate cancer  . CORONARY ARTERY DISEASE 11/16/2006    LHC (07/2011): LAD  with luminal irregularities, proximal circumflex 50%, EF 25%  . Chronic systolic CHF (congestive heart failure)     echo (10/02/12): EF 35-40%, grade 1 diastolic dysfunction, trivial AI, mild MR, mild RVE  . NICM (nonischemic cardiomyopathy)     PAST SURGICAL HISTORY: Past Surgical History  Procedure Laterality Date  . Tonsilectomy, adenoidectomy, bilateral myringotomy and tubes    . Knee arthroscopy      2 surgeries right and 1 left  . Prostate surgery  2/02    prostate cancer  . Uvulopalatopharyngoplasty  2013  . Esophagogastroduodenoscopy      with dilitation  . Lumbar laminectomy/decompression microdiscectomy  03/02/2011    Procedure: LUMBAR LAMINECTOMY/DECOMPRESSION MICRODISCECTOMY;  Surgeon: Javier Docker, MD;  Location: [facility];  Service: Orthopedics;  Laterality: N/A;  Decompression Lumbar 4 - Lumbar(X-Ray)  . Pacemaker insertion  06/2011    [employer] PPM implanted by Dr [employer]  . Cardiac defibrillator placement  01/16/12    Upgrade to a biventricular SJM ICD by DR Allred  . Tonsillectomy  as child  . Cardiac catheterization      several  . Back surgery    . Insert / replace / remove pacemaker    . Total knee arthroplasty Left 06/04/2012    Procedure: TOTAL KNEE ARTHROPLASTY;  Surgeon: Javier Docker, MD;  Location: [facility];  Service: Orthopedics;  Laterality: Left;    MEDICATIONS: Current Outpatient Prescriptions on File Prior to Visit  Medication Sig Dispense Refill  . acetaminophen (TYLENOL) 500 MG tablet Take 500 mg by mouth every 6 (six) hours as needed for pain. Per bottle as needed for pain      . albuterol (PROVENTIL HFA;VENTOLIN HFA) 108 (90 BASE) MCG/ACT inhaler Inhale 2 puffs into the lungs every 6 (six) hours as needed for shortness of breath.       Marland Kitchen amiodarone (PACERONE) 200 MG tablet Take 200 mg by mouth daily.       Marland Kitchen aspirin EC 81 MG tablet Take 81 mg by mouth every evening.       Marland Kitchen LORazepam (ATIVAN) 0.5 MG tablet Take 0.5 mg by mouth every  evening.       . metoprolol succinate (TOPROL-XL) 50 MG 24 hr tablet Take 1 tablet (50mg ) in morning and take 1/2 tablet (25mg ) in evening.  45 tablet  11  . NASONEX 50 MCG/ACT nasal spray TWO SPRAYS IN EACH NOSTRIL ONCE DAILY  17 g  0  . pantoprazole (PROTONIX) 40 MG tablet TAKE ONE TABLET BY MOUTH ONCE DAILY  30 tablet  2  . polyethylene glycol (MIRALAX / GLYCOLAX) packet Take 8.5 g by mouth daily as needed.      . ramipril (ALTACE) 10 MG capsule Take 2 capsules (20 mg total) by mouth daily.  60 capsule  6  . rosuvastatin (  CRESTOR) 5 MG tablet Take 5 mg by mouth daily with breakfast.      . sertraline (ZOLOFT) 50 MG tablet Take 1 tablet (50 mg total) by mouth daily.  30 tablet  3  . spironolactone (ALDACTONE) 25 MG tablet Take 1 tablet (25 mg total) by mouth daily.  30 tablet  6  . warfarin (COUMADIN) 2.5 MG tablet Take 1 tablet by mouth daily or as directed  30 tablet  3  . zolpidem (AMBIEN) 5 MG tablet TAKE ONE TABLET AT BEDTIME AS NEEDED  30 tablet  3   No current facility-administered medications on file prior to visit.    ALLERGIES: Allergies  Allergen Reactions  . Hydrocodone Other (See Comments)    Severe panic attacks  . Oxycodone Other (See Comments)    Severe panic attacks    FAMILY HISTORY: Family History  Problem Relation Age of Onset  . Heart attack Father   . Heart attack Brother   . Heart disease Maternal Uncle   . Heart attack Maternal Uncle   . Colon cancer Neg Hx     SOCIAL HISTORY: History   Social History  . Marital Status: Married    Spouse Name: N/A    Number of Children: N/A  . Years of Education: N/A   Occupational History  . Not on file.   Social History Main Topics  . Smoking status: Former Smoker -- 1.00 packs/day for 4 years    Types: Cigarettes    Quit date: 03/22/1964  . Smokeless tobacco: Never Used     Comment: reports smoked socially  . Alcohol Use: 0.0 oz/week     Comment: 2 mixed drinks daily, some days  . Drug Use: No  .  Sexual Activity: Not on file   Other Topics Concern  . Not on file   Social History Narrative  . No narrative on file    REVIEW OF SYSTEMS: Constitutional: No fevers, chills, or sweats, no generalized fatigue, change in appetite Eyes: No visual changes, double vision, eye pain Ear, nose and throat: No hearing loss, ear pain, nasal congestion, sore throat Cardiovascular: No chest pain, palpitations Respiratory:  No shortness of breath at rest or with exertion, wheezes GastrointestinaI: No nausea, vomiting, diarrhea, abdominal pain, fecal incontinence Genitourinary:  No dysuria, urinary retention or frequency Musculoskeletal:  No neck pain, back pain Integumentary: No rash, pruritus, skin lesions Neurological: as above Psychiatric: No depression, insomnia, anxiety Endocrine: No palpitations, fatigue, diaphoresis, mood swings, change in appetite, change in weight, increased thirst Hematologic/Lymphatic:  No anemia, purpura, petechiae. Allergic/Immunologic: no itchy/runny eyes, nasal congestion, recent allergic reactions, rashes  PHYSICAL EXAM: Filed Vitals:   01/09/13 0800  BP: 118/70  Pulse: 78  Temp: 97.5 F (36.4 C)   General: No acute distress Head:  Normocephalic/atraumatic Neck: supple, no paraspinal tenderness, full range of motion Back: No paraspinal tenderness Heart: regular rate and rhythm Lungs: Clear to auscultation bilaterally. Vascular: No carotid bruits. Neurological Exam: Mental status: alert and oriented to person, place, and time, speech fluent and not dysarthric, able to name, repeat and follow commands.  Able to perform Trail Making Test, copy cube and draw clock correctly.  Attention intact.  Able to perform serial 7 subtraction.  Abstraction intact.  Unable to recall 5 out of 5 words after a few minutes (able to with cues).   MOCA score 25/30 for the delayed recall impairment. Cranial nerves: CN I: not tested CN II: pupils equal, round and reactive to  light, visual  fields intact, fundi unremarkable. CN III, IV, VI:  full range of motion, no nystagmus, no ptosis CN V: facial sensation intact CN VII: upper and lower face symmetric CN VIII: hearing intact CN IX, X: gag intact, uvula midline CN XI: sternocleidomastoid and trapezius muscles intact CN XII: tongue midline Bulk & Tone: normal, no fasciculations. Motor: 5/5 throughout Sensation: pinprick and vibration intact Deep Tendon Reflexes: 2+ throughout, toes down Finger to nose testing: normal Heel to shin: normal Gait: normal stance and stride.  Some difficulty with tandem walking. Romberg negative.  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  Will initiate Aricept.  Side effects discussed. 2.  Will check B12 level and CT of brain 3.  Will see how he does by stopping the lorazepam.  Eventually, I would like him to discontinue Ambien as well. 4.  Encouraged exercise, socialization. 5.  Instructed he should drive only daylight hours. 6.  Follow up in 6 months.  60 minutes spent with patient and wife, over 50% spent counseling and coordinating care.  Thank you for allowing me to take part in the care of this patient.  Shon Millet, DO  CC:  Birdie Sons, MD

## 2013-01-09 NOTE — Patient Instructions (Addendum)
1.  We will start donepezil (Aricept) 5mg  daily for four weeks.  If you are tolerating the medication, then after four weeks, we will increase the dose to 10mg  daily.  Side effects include nausea, vomiting, diarrhea, vivid dreams, and muscle cramps.  Please call the clinic if you experience any of these symptoms. 2.  Stop the lorazepam and see how you do.  Call in 2 weeks with update.  Eventually, I would like you to get off Ambien as well. 3.  Drive only during daylight hours. 4.  Check B12 5.  Check CAT scan of brain 6.  Exercise regularly and stay active and socialize. 7.  Follow up in 6 months.  Someone will call your regarding your appointment for the head CT.

## 2013-01-11 ENCOUNTER — Telehealth: Payer: Self-pay | Admitting: Neurology

## 2013-01-11 ENCOUNTER — Ambulatory Visit (INDEPENDENT_AMBULATORY_CARE_PROVIDER_SITE_OTHER)
Admission: RE | Admit: 2013-01-11 | Discharge: 2013-01-11 | Disposition: A | Discharge status: Home / Self Care | Payer: Medicare Other | Source: Ambulatory Visit | Attending: Neurology | Admitting: Neurology

## 2013-01-11 DIAGNOSIS — F09 Unspecified mental disorder due to known physiological condition: Secondary | ICD-10-CM

## 2013-01-11 DIAGNOSIS — R4189 Other symptoms and signs involving cognitive functions and awareness: Secondary | ICD-10-CM

## 2013-01-11 DIAGNOSIS — L821 Other seborrheic keratosis: Secondary | ICD-10-CM | POA: Diagnosis not present

## 2013-01-11 DIAGNOSIS — Z85828 Personal history of other malignant neoplasm of skin: Secondary | ICD-10-CM | POA: Diagnosis not present

## 2013-01-11 DIAGNOSIS — L570 Actinic keratosis: Secondary | ICD-10-CM | POA: Diagnosis not present

## 2013-01-11 DIAGNOSIS — G9389 Other specified disorders of brain: Secondary | ICD-10-CM | POA: Diagnosis not present

## 2013-01-11 LAB — METHYLMALONIC ACID, SERUM: Methylmalonic Acid, Quant: 0.14 umol/L (ref <0.40)

## 2013-01-11 NOTE — Telephone Encounter (Signed)
Spoke with patient's wife. Information given as per Dr. Everlena Cooper below. No additional questions/concerns at this time.

## 2013-01-11 NOTE — Telephone Encounter (Signed)
Message copied by Benay Spice on Fri Jan 11, 2013  2:12 PM ------      Message from: JAFFE, ADAM R      Created: Fri Jan 11, 2013 12:12 PM       CT shows evidence of old stroke but nothing new or too revealing.      ----- Message -----         From: Rad Results In Interface         Sent: 01/11/2013  11:59 AM           To: Cira Servant, DO                   ------

## 2013-01-22 ENCOUNTER — Telehealth: Payer: Self-pay | Admitting: Neurology

## 2013-01-22 ENCOUNTER — Encounter: Payer: Self-pay | Admitting: Physician Assistant

## 2013-01-22 NOTE — Telephone Encounter (Signed)
Returning a call from the patient that was left on my answering machine at 0950. He wanted to know what the Aricept was for and why he should take it? We discussed his mild memory changes and I told him Dr. Everlena Cooper prescribed it for those changes. He states he will start taking the medication now that he knows what it is for. We also discussed the results of his CT head and the fact that it showed an "old stroke". I told the patient that it was nothing to be be concerned about and that Dr. Everlena Cooper was of course aware. I then decided to call and speak with the patient's wife. I let her know that Corey [name] called and didn't know what the medication was for etc. She told me that he has been somewhat confused and reluctant to start the medication. We talked about that and she will see to it that he starts taking it. Asked that they call before it runs out for a refill. She states she will. Also he stopped the Lorazepam and seems to be fine without it. She did also report that he had a recent fall. I told her to call us re: medication tolerance and if she felt like he needed to be seen sooner that May we could schedule an appointment for him. She was fine with this plan. **Dr. Everlena Cooper, just an FYI...Marland KitchenMarland Kitchen

## 2013-01-23 ENCOUNTER — Other Ambulatory Visit: Payer: Self-pay | Admitting: Internal Medicine

## 2013-01-28 ENCOUNTER — Encounter: Payer: Self-pay | Admitting: Physician Assistant

## 2013-01-28 ENCOUNTER — Ambulatory Visit (INDEPENDENT_AMBULATORY_CARE_PROVIDER_SITE_OTHER): Payer: Medicare Other | Admitting: Physician Assistant

## 2013-01-28 ENCOUNTER — Telehealth: Payer: Self-pay | Admitting: *Deleted

## 2013-01-28 VITALS — BP 110/70 | HR 61 | Ht 72.0 in | Wt 190.0 lb

## 2013-01-28 DIAGNOSIS — Z860100 Personal history of colon polyps, unspecified: Secondary | ICD-10-CM

## 2013-01-28 DIAGNOSIS — Z7901 Long term (current) use of anticoagulants: Secondary | ICD-10-CM

## 2013-01-28 DIAGNOSIS — I2510 Atherosclerotic heart disease of native coronary artery without angina pectoris: Secondary | ICD-10-CM

## 2013-01-28 DIAGNOSIS — Z1211 Encounter for screening for malignant neoplasm of colon: Secondary | ICD-10-CM

## 2013-01-28 MED ORDER — MOVIPREP 100 G PO SOLR: 1.0000 | ORAL | Status: DC

## 2013-01-28 NOTE — Progress Notes (Signed)
Subjective:    Patient ID: Corey [name], male    DOB: [birth year], 78 y.o.   MRN: [id number]  HPI  Corey [name] is a pleasant 78 year old white male known to Dr. Delfin Edis. He does have history of adenomatous colon polyps and last had colonoscopy in January of 2010. He was found to have severe diverticulosis at that time of the sigmoid colon and a pedunculated polyp in the descending colon was removed. Path on this polyp was consistent with a tubular adenoma. He last had EGD in 2011 with distal esophageal stricture dilated and noted to have duodenitis. Patient does have multiple comorbidities including congestive heart failure, a nonischemic cardiomyopathy with last EF measured at 35-40% in September of 2014. He is status post pacemaker and ICD placement. Also has history of a TIA, atrial fibrillation, coronary artery disease, prostate cancer, and recent problems with memory loss. He is maintained on Coumadin for the atrial fibrillation and currently followed by Dr. Rayann Heman. Comes in today to discuss followup colonoscopy. He denies any problems with abdominal pain or change in his his bowel habits. He has had some problems recently with constipation but had stopped taking his MiraLax because he was tired of taking so many medications. He says MiraLax is helpful when he takes it.    Review of Systems  Constitutional: Negative.   HENT: Negative.   Eyes: Negative.   Respiratory: Negative.   Cardiovascular: Negative.   Gastrointestinal: Positive for constipation.  Endocrine: Negative.   Genitourinary: Negative.   Musculoskeletal: Negative.   Skin: Negative.   Allergic/Immunologic: Negative.   Neurological: Negative.   Hematological: Negative.   Psychiatric/Behavioral: Negative.    Outpatient Prescriptions Prior to Visit  Medication Sig Dispense Refill  . acetaminophen (TYLENOL) 500 MG tablet Take 500 mg by mouth every 6 (six) hours as needed for pain. Per bottle as needed for pain      .  albuterol (PROVENTIL HFA;VENTOLIN HFA) 108 (90 BASE) MCG/ACT inhaler Inhale 2 puffs into the lungs every 6 (six) hours as needed for shortness of breath.       Marland Kitchen amiodarone (PACERONE) 200 MG tablet Take 200 mg by mouth daily.       Marland Kitchen aspirin EC 81 MG tablet Take 81 mg by mouth every evening.       . donepezil (ARICEPT) 5 MG tablet Take 1 tablet (5 mg total) by mouth at bedtime.  30 tablet  0  . LORazepam (ATIVAN) 0.5 MG tablet Take 0.5 mg by mouth every evening.       . metoprolol succinate (TOPROL-XL) 50 MG 24 hr tablet Take 1 tablet (50mg ) in morning and take 1/2 tablet (25mg ) in evening.  45 tablet  11  . NASONEX 50 MCG/ACT nasal spray TWO SPRAYS IN EACH NOSTRIL ONCE DAILY  17 g  2  . pantoprazole (PROTONIX) 40 MG tablet TAKE ONE TABLET BY MOUTH ONCE DAILY  30 tablet  2  . ramipril (ALTACE) 10 MG capsule Take 2 capsules (20 mg total) by mouth daily.  60 capsule  6  . rosuvastatin (CRESTOR) 5 MG tablet Take 5 mg by mouth daily with breakfast.      . sertraline (ZOLOFT) 50 MG tablet Take 1 tablet (50 mg total) by mouth daily.  30 tablet  3  . spironolactone (ALDACTONE) 25 MG tablet Take 1 tablet (25 mg total) by mouth daily.  30 tablet  6  . warfarin (COUMADIN) 2.5 MG tablet Take 1 tablet by mouth daily or as directed  30 tablet  3  . zolpidem (AMBIEN) 5 MG tablet TAKE ONE TABLET AT BEDTIME AS NEEDED  30 tablet  3  . polyethylene glycol (MIRALAX / GLYCOLAX) packet Take 8.5 g by mouth daily as needed.       No facility-administered medications prior to visit.   Allergies  Allergen Reactions  . Hydrocodone Other (See Comments)    Severe panic attacks  . Oxycodone Other (See Comments)    Severe panic attacks   Patient Active Problem List   Diagnosis Date Noted  . Left knee DJD 06/04/2012  . Long term (current) use of anticoagulants 05/12/2012  . [employer] 11/01/2011  . Chronic systolic heart failure 16/10/9602  . Symptomatic bradycardia, secondary to sinus node  dysfunction 07/08/2011  . Tachy-brady syndrome 07/08/2011  . Vocal cord nodule 05/19/2011  . Lumbar spinal stenosis 03/03/2011  . OTHER TESTICULAR HYPOFUNCTION 08/26/2009  . Seasonal and perennial allergic rhinitis 04/15/2009  . INSOMNIA-SLEEP DISORDER-UNSPEC 02/11/2009  . OBSTRUCTIVE SLEEP APNEA 11/10/2008  . ALCOHOL ABUSE, EPISODIC 05/19/2008  . HYPERLIPIDEMIA 02/14/2008  . GERD 02/14/2008  . DIVERTICULOSIS, COLON 02/14/2008  . ESOPHAGEAL STRICTURE 01/28/2008  . HYPERTENSION 11/16/2006  . MYOCARDIAL INFARCTION, HX OF 11/16/2006  . CORONARY ARTERY DISEASE 11/16/2006  . Atrial fibrillation 11/16/2006  . HYPERGLYCEMIA 11/16/2006  . TRANSIENT ISCHEMIC ATTACKS, HX OF 11/16/2006  . PROSTATE CANCER, HX OF 02/25/2000  . Anxiety 01/25/1999   History  Substance Use Topics  . Smoking status: Former Smoker -- 1.00 packs/day for 4 years    Types: Cigarettes    Quit date: 03/22/1964  . Smokeless tobacco: Never Used     Comment: reports smoked socially  . Alcohol Use: 0.0 oz/week     Comment: 2 mixed drinks daily, some days      family history includes Heart attack in his brother, father, and maternal uncle; Heart disease in his maternal uncle. There is no history of Colon cancer.  Objective:   Physical Exam    white male in no acute distress, accompanied by his wife blood pressure 110/70 pulse 61 height 6 foot weight 190. HEENT; nontraumatic normocephalic EOMI PERRLA sclera anicteric, Supple; no JVD, Cardiovascular; irregular rate and rhythm with S1-S2 he does have a defibrillator pacemaker in the left chest wall, Pulmonary; clear bilaterally, Abdomen; soft nontender nondistended bowel sounds are active there is no palpable mass or hepatosplenomegaly, Rectal; exam not done, Extremities ;no clubbing cyanosis or edema skin warm and dry, Psych; mood and affect appropriate        Assessment & Plan:  #78 year old white male with history of tubular adenomatous polyps due for followup  colonoscopy at 5 year interval #2 chronic mild constipation #3 chronic anticoagulation with Coumadin #4 atrial fibrillation #5 nonischemic cardiomyopathy with EF of 35-40% last echo September 2014. Patient is status post ICD and pacemaker placement #6 history of TIA #7 coronary artery disease #8 sleep apnea #9 hypertension #10 chronic GERD history of esophageal stricture no current dysphagia #10 recent issues with memory Plan; Patient is scheduled for colonoscopy with Dr. Delfin Edis. Will obtain consent from Dr. Rayann Heman for patient to hold Coumadin for 5 days prior to the procedure. Hopefully he will not require any further colonoscopic followup due to age and multiple comorbidities.  Patient will resume MiraLax 17 g daily in 8 ounces of water. He may take this daily or every other day

## 2013-01-28 NOTE — Telephone Encounter (Signed)
01/28/2013   RE: NOAH LEMBKE DOB: [birth year] MRN: [id number]   Dear Allean Found,    We have scheduled the above patient for an endoscopic procedure. Our records show that he is on anticoagulation therapy.   Please advise as to how long the patient may come off his therapy of Coumadin prior to the procedure, which is scheduled for 02-05-2013.  Please fax back/ or route the completed form to [facility] at [phone].   Sincerely,    Amy Esterwood PA-C    Marisue Humble CMA

## 2013-01-28 NOTE — Progress Notes (Signed)
Reviewed and agree.I am  , but the risks and the benefits are about the same at his age.

## 2013-01-28 NOTE — Patient Instructions (Signed)
You have been scheduled for a colonoscopy with propofol. Please follow written instructions given to you at your visit today.  Please pick up your prep kit at the pharmacy within the next 1-3 days. [facility]. If you use inhalers (even only as needed), please bring them with you on the day of your procedure.  We will call you once we hear from Dr. Rayann Heman regarding the coumadin clearance for the procedure.

## 2013-01-28 NOTE — Telephone Encounter (Signed)
Given prior TIA and elevated CHADS2VASC score, I would favor lovenox bridge while off of antiocoagulation. I will ask our anticoagulation team to assist with this.

## 2013-01-29 ENCOUNTER — Telehealth: Payer: Self-pay | Admitting: *Deleted

## 2013-01-29 NOTE — Telephone Encounter (Signed)
I called and spoke to the patient's wife [name]. I left a message for her husband that I saw that he has an appointment with Dr. Rayann Heman and the coumadin nurse regarding the lovenox injections and the coumadin clearance.  He still has his colonoscopy scheduled for 02-05-2013 with Dr. Olevia Perches.  I told her to have him follow their instructions and we will see him in our Endoscopy Unit on the 4th floor on the 13th.

## 2013-01-29 NOTE — Telephone Encounter (Signed)
Spoke with pt.  He has never done Lovenox injections before.  He will come into the office on 1/7 to set everything up.

## 2013-01-29 NOTE — Telephone Encounter (Signed)
See phone note for 01-29-2013.

## 2013-01-30 ENCOUNTER — Other Ambulatory Visit: Payer: Self-pay | Admitting: *Deleted

## 2013-01-30 ENCOUNTER — Ambulatory Visit (INDEPENDENT_AMBULATORY_CARE_PROVIDER_SITE_OTHER): Payer: Medicare Other

## 2013-01-30 ENCOUNTER — Ambulatory Visit (INDEPENDENT_AMBULATORY_CARE_PROVIDER_SITE_OTHER): Payer: Medicare Other | Admitting: *Deleted

## 2013-01-30 DIAGNOSIS — J309 Allergic rhinitis, unspecified: Secondary | ICD-10-CM

## 2013-01-30 DIAGNOSIS — Z7901 Long term (current) use of anticoagulants: Secondary | ICD-10-CM

## 2013-01-30 DIAGNOSIS — I4891 Unspecified atrial fibrillation: Secondary | ICD-10-CM

## 2013-01-30 LAB — POCT INR: INR: 3.9

## 2013-01-30 MED ORDER — ENOXAPARIN SODIUM 80 MG/0.8ML ~~LOC~~ SOLN: 80.0000 mg | Freq: Two times a day (BID) | SUBCUTANEOUS | Status: DC

## 2013-01-30 NOTE — Patient Instructions (Signed)
01/30/13- Stop Coumadin until after Colonoscopy, restart when Okay'd  Dr Olevia Perches 01/31/13- Do Nothing 02/01/13-Start Lovenox injection 80mg s in PM at 9pm.  02/02/13- Taking Lovenox injection 80mg s in AM and PM at 9am and  9PM.  02/03/13- Continue Lovenox injection 80mg s at 9am and 9PM.  02/04/13- ** Only take 9AM Lovenox injection. 02/05/13- Day of Procedure, No Lovenox    Restart Coumadin and Lovenox injections after Colonoscopy when ok with Dr Olevia Perches. When restart Coumadin take 1.5 tablets for 2 days then resume 1 tablet daily. Restart Lovenox  80mg s injections every 12 hours.

## 2013-02-04 ENCOUNTER — Encounter: Payer: Self-pay | Admitting: Internal Medicine

## 2013-02-04 ENCOUNTER — Ambulatory Visit (INDEPENDENT_AMBULATORY_CARE_PROVIDER_SITE_OTHER): Payer: Medicare Other | Admitting: Internal Medicine

## 2013-02-04 ENCOUNTER — Ambulatory Visit: Payer: Medicare Other | Admitting: Internal Medicine

## 2013-02-04 VITALS — BP 121/79 | HR 69 | Ht 72.0 in | Wt 192.0 lb

## 2013-02-04 DIAGNOSIS — I5022 Chronic systolic (congestive) heart failure: Secondary | ICD-10-CM

## 2013-02-04 DIAGNOSIS — I2510 Atherosclerotic heart disease of native coronary artery without angina pectoris: Secondary | ICD-10-CM

## 2013-02-04 DIAGNOSIS — F1010 Alcohol abuse, uncomplicated: Secondary | ICD-10-CM

## 2013-02-04 DIAGNOSIS — I495 Sick sinus syndrome: Secondary | ICD-10-CM

## 2013-02-04 DIAGNOSIS — I428 Other cardiomyopathies: Secondary | ICD-10-CM | POA: Diagnosis not present

## 2013-02-04 DIAGNOSIS — I498 Other specified cardiac arrhythmias: Secondary | ICD-10-CM

## 2013-02-04 DIAGNOSIS — I4891 Unspecified atrial fibrillation: Secondary | ICD-10-CM

## 2013-02-04 DIAGNOSIS — R001 Bradycardia, unspecified: Secondary | ICD-10-CM

## 2013-02-04 DIAGNOSIS — I10 Essential (primary) hypertension: Secondary | ICD-10-CM

## 2013-02-04 LAB — MDC_IDC_ENUM_SESS_TYPE_INCLINIC
Brady Statistic RV Percent Paced: 99.63 %
HighPow Impedance: 68.625
HighPow Impedance: 69 Ohm
Implantable Pulse Generator Model: 3265
Implantable Pulse Generator Serial Number: 7057550
Lead Channel Impedance Value: 1075 Ohm
Lead Channel Impedance Value: 562.5 Ohm
Lead Channel Pacing Threshold Amplitude: 0.75 V
Lead Channel Pacing Threshold Amplitude: 1 V
Lead Channel Pacing Threshold Pulse Width: 0.5 ms
Lead Channel Sensing Intrinsic Amplitude: 12 mV
Lead Channel Sensing Intrinsic Amplitude: 2.4 mV
Lead Channel Setting Pacing Amplitude: 2 V
Lead Channel Setting Pacing Pulse Width: 0.5 ms
Lead Channel Setting Pacing Pulse Width: 0.5 ms
Lead Channel Setting Sensing Sensitivity: 0.5 mV
MDC IDC MSMT BATTERY REMAINING LONGEVITY: 66 mo
MDC IDC MSMT LEADCHNL LV PACING THRESHOLD AMPLITUDE: 1 V
MDC IDC MSMT LEADCHNL RA PACING THRESHOLD PULSEWIDTH: 0.5 ms
MDC IDC MSMT LEADCHNL RV IMPEDANCE VALUE: 425 Ohm
MDC IDC MSMT LEADCHNL RV PACING THRESHOLD PULSEWIDTH: 0.5 ms
MDC IDC SESS DTM: 20150112114841
MDC IDC SET LEADCHNL LV PACING AMPLITUDE: 2 V
MDC IDC SET LEADCHNL RV PACING AMPLITUDE: 2.5 V
MDC IDC SET ZONE DETECTION INTERVAL: 270 ms
MDC IDC STAT BRADY RA PERCENT PACED: 99.77 %
Zone Setting Detection Interval: 340 ms

## 2013-02-04 NOTE — Patient Instructions (Addendum)
Your physician wants you to follow-up in: 6 months with Dr Vallery Ridge will receive a reminder letter in the mail two months in advance. If you don't receive a letter, please call our office to schedule the follow-up appointment.  Remote monitoring is used to monitor your Pacemaker or ICD from home. This monitoring reduces the number of office visits required to check your device to one time per year. It allows [place] to keep an eye on the functioning of your device to ensure it is working properly. You are scheduled for a device check from home on 05/08/13. You may send your transmission at any time that day. If you have a wireless device, the transmission will be sent automatically. After your physician reviews your transmission, you will receive a postcard with your next transmission date.  Keep follow up appointment in our Coumadin Clinic as scheduled

## 2013-02-04 NOTE — Progress Notes (Signed)
PCP: Chancy Hurter, MD Primary Cardiologist:   Previously Dr Camelia Eng RALLY OUCH is a 78 y.o. male who presents today for routine cardiology/ electrophysiology followup.  Since his last visit, the patient reports doing very well.  His energy and exercise tolerance are good.  His primary concern today is with progressive memory loss.  His wife finds this also concerning.  Unfortunately, he continues to drink a significant amount of alcohol and does not seem to have intention to quit.  Today, he denies symptoms of palpitations, chest pain, shortness of breath,  lower extremity edema, dizziness, presyncope, syncope, or ICD shocks.  The patient is otherwise without complaint today.   Past Medical History  Diagnosis Date  . Alcohol abuse, episodic 05/19/2008  . ALLERGIC RHINITIS 04/15/2009  . COLONIC POLYPS, ADENOMATOUS, HX OF 02/14/2008  . DIVERTICULOSIS, COLON 02/14/2008  . ESOPHAGEAL STRICTURE 01/28/2008  . GERD 02/14/2008  . HYPERGLYCEMIA 11/16/2006  . HYPERLIPIDEMIA 02/14/2008  . INSOMNIA-SLEEP DISORDER-UNSPEC 02/11/2009  . 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 PROSTATE CANCER, HX OF 02/25/2000  . TRANSIENT ISCHEMIC ATTACKS, HX OF 11/16/2006  . Arthritis   . Atrial fibrillation 11/16/2006    remote CHF related to atrial fib with rapid ventricular response over 25 yrs per office note,  . ASTHMA 11/16/2006    sinusitis-    hx ?yeast patch/white patch on vocal cord as per [facility] 02/25/11-   . OBSTRUCTIVE SLEEP APNEA 11/10/2008  . SLEEP APNEA 10/03/2009    LOV Dr [name] 12/12 in EPIC    Moderate per patient- settings ?6/last sleep study years ago  . HYPERTENSION 11/16/2006  . Symptomatic bradycardia, secondary to sinus node dysfunction 07/08/2011    s/p [facility] Scientific PPM by Dr Sallyanne Kuster, upgraded to [place] ICD 12/13 by Dr Rayann Heman (SJM)  . Myocardial infarction 1993  . Pacemaker     [employer]  . ICD (implantable cardiac defibrillator) in place   . Anxiety   . Depression   . Pneumonia      hx of  . Headache(784.0)   . Cancer 2002    prostate cancer  . CORONARY ARTERY DISEASE 11/16/2006    LHC (07/2011): LAD with luminal irregularities, proximal circumflex 50%, EF 25%  . Chronic systolic CHF (congestive heart failure)     echo (10/02/12): EF 41-28%, grade 1 diastolic dysfunction, trivial AI, mild MR, mild RVE  . NICM (nonischemic cardiomyopathy)    Past Surgical History  Procedure Laterality Date  . Tonsilectomy, adenoidectomy, bilateral myringotomy and tubes    . Knee arthroscopy      2 surgeries right and 1 left  . Prostate surgery  2/02    prostate cancer  . Uvulopalatopharyngoplasty  2013  . Esophagogastroduodenoscopy      with dilitation  . Lumbar laminectomy/decompression microdiscectomy  03/02/2011    Procedure: LUMBAR LAMINECTOMY/DECOMPRESSION MICRODISCECTOMY;  Surgeon: [name], MD;  Location: [facility];  Service: Orthopedics;  Laterality: N/A;  Decompression Lumbar 4 - Lumbar(X-Ray)  . Pacemaker insertion  06/2011    [employer] PPM implanted by Dr [employer]  . Cardiac defibrillator placement  01/16/12    Upgrade to a biventricular SJM ICD by DR Cobi Delph  . Tonsillectomy  as child  . Cardiac catheterization      several  . Back surgery    . Insert / replace / remove pacemaker    . Total knee arthroplasty Left 06/04/2012    Procedure: TOTAL KNEE ARTHROPLASTY;  Surgeon: [name], MD;  Location: [facility];  Service:  Orthopedics;  Laterality: Left;    Current Outpatient Prescriptions  Medication Sig Dispense Refill  . acetaminophen (TYLENOL) 500 MG tablet Take 500 mg by mouth every 6 (six) hours as needed for pain. Per bottle as needed for pain      . albuterol (PROVENTIL HFA;VENTOLIN HFA) 108 (90 BASE) MCG/ACT inhaler Inhale 2 puffs into the lungs every 6 (six) hours as needed for shortness of breath.       Marland Kitchen amiodarone (PACERONE) 200 MG tablet Take 200 mg by mouth daily.       Marland Kitchen aspirin EC 81 MG tablet Take 81 mg by mouth every evening.       .  donepezil (ARICEPT) 5 MG tablet Take 1 tablet (5 mg total) by mouth at bedtime.  30 tablet  0  . enoxaparin (LOVENOX) 80 MG/0.8ML injection Inject 80 mg into the skin every 12 (twelve) hours. Take as directed by Dr. Maurene Capes      . metoprolol succinate (TOPROL-XL) 50 MG 24 hr tablet Take 1 tablet (2m) in morning and take 1/2 tablet (240m in evening.  45 tablet  11  . mometasone (NASONEX) 50 MCG/ACT nasal spray TWO SPRAYS IN EACH NOSTRIL AS NEEDED      . MOVIPREP 100 G SOLR Take 1 kit (200 g total) by mouth as directed.  1 kit  0  . pantoprazole (PROTONIX) 40 MG tablet TAKE ONE TABLET BY MOUTH ONCE DAILY  30 tablet  2  . ramipril (ALTACE) 10 MG capsule Take 2 capsules (20 mg total) by mouth daily.  60 capsule  6  . rosuvastatin (CRESTOR) 5 MG tablet Take 5 mg by mouth at bedtime.       . sertraline (ZOLOFT) 50 MG tablet Take 1 tablet (50 mg total) by mouth daily.  30 tablet  3  . spironolactone (ALDACTONE) 25 MG tablet Take 1 tablet (25 mg total) by mouth daily.  30 tablet  6  . warfarin (COUMADIN) 2.5 MG tablet Take 1 tablet by mouth daily or as directed  30 tablet  3  . zolpidem (AMBIEN) 5 MG tablet TAKE 1/2 TABLET AT BEDTIME AS NEEDED       No current facility-administered medications for this visit.    Physical Exam: Filed Vitals:   02/04/13 1123  BP: 121/79  Pulse: 69  Height: 6' (1.829 m)  Weight: 192 lb (87.091 kg)    GEN- The patient is well appearing, alert and oriented x 3 today.   Head- normocephalic, atraumatic Eyes-  Sclera clear, conjunctiva pink Ears- hearing intact Oropharynx- clear Lungs- Clear to ausculation bilaterally, normal work of breathing Chest- ICD pocket is well healed Heart- Regular rate and rhythm, no murmurs, rubs or gallops, PMI not laterally displaced GI- soft, NT, ND, + BS Extremities- no clubbing, cyanosis, or edema  ICD interrogation- reviewed in detail today,  See PACEART report  Assessment and Plan:  1.  Chronic systolic  dysfunction euvolemic today Stable on an appropriate medical regimen Normal BiV ICD function I will enroll in our ICM clinic for monitoring of [place] See Pace Art report No changes today EF has improved to 35-40% with CRT  2. ETOH Cessation advised He is not ready to quit  3. HTN Stable No change required today  4. AFib CHADS2VASC score is at least 6.  I would therefore favor lifelong anticoagulation Maintaining sinus rhythm with amiodarone  Merlin Return to see me in 6 months

## 2013-02-05 ENCOUNTER — Encounter: Payer: Medicare Other | Admitting: Internal Medicine

## 2013-02-05 ENCOUNTER — Encounter: Payer: Self-pay | Admitting: Internal Medicine

## 2013-02-05 ENCOUNTER — Ambulatory Visit (AMBULATORY_SURGERY_CENTER): Payer: Medicare Other | Admitting: Internal Medicine

## 2013-02-05 VITALS — BP 108/75 | HR 61 | Temp 97.1°F | Resp 22 | Ht 72.0 in | Wt 190.0 lb

## 2013-02-05 DIAGNOSIS — Z1211 Encounter for screening for malignant neoplasm of colon: Secondary | ICD-10-CM | POA: Diagnosis not present

## 2013-02-05 DIAGNOSIS — Z860100 Personal history of colon polyps, unspecified: Secondary | ICD-10-CM

## 2013-02-05 DIAGNOSIS — K5900 Constipation, unspecified: Secondary | ICD-10-CM

## 2013-02-05 DIAGNOSIS — G4733 Obstructive sleep apnea (adult) (pediatric): Secondary | ICD-10-CM | POA: Diagnosis not present

## 2013-02-05 MED ORDER — SODIUM CHLORIDE 0.9 % IV SOLN: 500.0000 mL | INTRAVENOUS | Status: DC

## 2013-02-05 MED ORDER — POLYETHYLENE GLYCOL 3350 17 GM/SCOOP PO POWD: 17.0000 g | Freq: Every day | ORAL | Status: DC

## 2013-02-05 NOTE — Patient Instructions (Addendum)
YOU HAD AN ENDOSCOPIC PROCEDURE TODAY AT THE [facility]: Refer to the procedure report that was given to you for any specific questions about what was found during the examination.  If the procedure report does not answer your questions, please call your gastroenterologist to clarify.  If you requested that your care partner not be given the details of your procedure findings, then the procedure report has been included in a sealed envelope for you to review at your convenience later.  YOU SHOULD EXPECT: Some feelings of bloating in the abdomen. Passage of more gas than usual.  Walking can help get rid of the air that was put into your GI tract during the procedure and reduce the bloating. If you had a lower endoscopy (such as a colonoscopy or flexible sigmoidoscopy) you may notice spotting of blood in your stool or on the toilet paper. If you underwent a bowel prep for your procedure, then you may not have a normal bowel movement for a few days.  DIET: Your first meal following the procedure should be a light meal and then it is ok to progress to your normal diet.  A half-sandwich or bowl of soup is an example of a good first meal.  Heavy or fried foods are harder to digest and may make you feel nauseous or bloated.  Likewise meals heavy in dairy and vegetables can cause extra gas to form and this can also increase the bloating.  Drink plenty of fluids but you should avoid alcoholic beverages for 24 hours.  ACTIVITY: Your care partner should take you home directly after the procedure.  You should plan to take it easy, moving slowly for the rest of the day.  You can resume normal activity the day after the procedure however you should NOT DRIVE or use heavy machinery for 24 hours (because of the sedation medicines used during the test).    SYMPTOMS TO REPORT IMMEDIATELY: A gastroenterologist can be reached at any hour.  During normal business hours, 8:30 AM to 5:00 PM Monday through Friday,  call [phone].  After hours and on weekends, please call the GI answering service at [phone] who will take a message and have the physician on call contact you.   Following lower endoscopy (colonoscopy or flexible sigmoidoscopy):  Excessive amounts of blood in the stool  Significant tenderness or worsening of abdominal pains  Swelling of the abdomen that is new, acute  Fever of 100F or higher    FOLLOW UP: If any biopsies were taken you will be contacted by phone or by letter within the next 1-3 weeks.  Call your gastroenterologist if you have not heard about the biopsies in 3 weeks.  Our staff will call the home number listed on your records the next business day following your procedure to check on you and address any questions or concerns that you may have at that time regarding the information given to you following your procedure. This is a courtesy call and so if there is no answer at the home number and we have not heard from you through the emergency physician on call, we will assume that you have returned to your regular daily activities without incident.  SIGNATURES/CONFIDENTIALITY: You and/or your care partner have signed paperwork which will be entered into your electronic medical record.  These signatures attest to the fact that that the information above on your After Visit Summary has been reviewed and is understood.  Full responsibility of the confidentiality   of this discharge information lies with you and/or your care-partner.   Start miralax 1 capful once a day  Resume the coumadin today as directed Continue the lovenox injections as directed. Keep doctor appt Friday Handout on diverticulosis, high fiber diet No recall colonoscopy needed per dr Olevia Perches

## 2013-02-05 NOTE — Progress Notes (Signed)
A/ox3 pleased with MAC, report to [facility]

## 2013-02-05 NOTE — Op Note (Signed)
[facility]  [address] [place], [address]   COLONOSCOPY PROCEDURE REPORT  PATIENT: Corey [name], Corey [name]  MR#: [id number] BIRTHDATE: 07/14/1935 , 77  yrs. old GENDER: Male ENDOSCOPIST: Lafayette Dragon, MD REFERRED LT:JQZES Swords, M.D. PROCEDURE DATE:  02/05/2013 PROCEDURE:   Colonoscopy, surveillance First Screening Colonoscopy - Avg.  risk and is 50 yrs.  old or older - No.  Prior Negative Screening - Now for repeat screening. N/A  History of Adenoma - Now for follow-up colonoscopy & has been > or = to 3 yrs.  Yes hx of adenoma.  Has been 3 or more years since last colonoscopy.  Polyps Removed Today? No.  Recommend repeat exam, <10 yrs? No. ASA CLASS:   Class III INDICATIONS:tubular adenoma removed on colonoscopy in January 2010. Patient has been on Coumadin for atrial fibrillation.  Discontinued 5 days ago.  History of diverticulosis. MEDICATIONS: MAC sedation, administered by CRNA and propofol (Diprivan) 200mg  IV  DESCRIPTION OF PROCEDURE:   After the risks benefits and alternatives of the procedure were thoroughly explained, informed consent was obtained.  A digital rectal exam revealed no abnormalities of the rectum.   The LB [id number]  endoscope was introduced through the anus and advanced to the cecum, which was identified by both the appendix and ileocecal valve. No adverse events experienced.   The quality of the prep was good, using MoviPrep  The instrument was then slowly withdrawn as the colon was fully examined.      COLON FINDINGS: There was severe diverticulosis noted in the descending colon and sigmoid colon with associated angulation, tortuosity, muscular hypertrophy and colonic narrowing.   Small internal hemorrhoids were found.  Retroflexed views revealed no abnormalities. The time to cecum=14 minutes 05 seconds.  Withdrawal time=6 minutes 08 seconds.  The scope was withdrawn and the procedure completed. COMPLICATIONS: There  were no complications.  ENDOSCOPIC IMPRESSION: 1.   There was severe diverticulosis noted in the descending colon and sigmoid colon 2.   Small internal hemorrhoids  RECOMMENDATIONS: 1.  High fiber diet 2.   Fiber supplements daily No recall colonoscopy to due to age   eSigned:  Lafayette Dragon, MD 02/05/2013 11:09 AM   cc:   PATIENT NAME:  Corey [name], Corey [name] MR#: [id number]

## 2013-02-06 ENCOUNTER — Telehealth: Payer: Self-pay | Admitting: *Deleted

## 2013-02-06 NOTE — Telephone Encounter (Signed)
  Follow up Call-  Call back number 02/05/2013  Post procedure Call Back phone  # [phone] hn  Permission to leave phone message Yes     Patient questions:  Do you have a fever, pain , or abdominal swelling? no Pain Score  0 *  Have you tolerated food without any problems? yes  Have you been able to return to your normal activities? yes  Do you have any questions about your discharge instructions: Diet   no Medications  no Follow up visit  no  Do you have questions or concerns about your Care? no  Actions: * If pain score is 4 or above: No action needed, pain <4.  Spoke with pt. Spouse.  She stated pt. Doing well, no problems, no questions.

## 2013-02-07 ENCOUNTER — Ambulatory Visit (INDEPENDENT_AMBULATORY_CARE_PROVIDER_SITE_OTHER): Payer: Medicare Other

## 2013-02-07 DIAGNOSIS — J309 Allergic rhinitis, unspecified: Secondary | ICD-10-CM | POA: Diagnosis not present

## 2013-02-08 ENCOUNTER — Ambulatory Visit (INDEPENDENT_AMBULATORY_CARE_PROVIDER_SITE_OTHER): Payer: Medicare Other | Admitting: *Deleted

## 2013-02-08 DIAGNOSIS — I4891 Unspecified atrial fibrillation: Secondary | ICD-10-CM

## 2013-02-08 DIAGNOSIS — Z7901 Long term (current) use of anticoagulants: Secondary | ICD-10-CM

## 2013-02-08 LAB — POCT INR: INR: 1.2

## 2013-02-11 ENCOUNTER — Other Ambulatory Visit: Payer: Self-pay | Admitting: Pharmacist Clinician (PhC)/ Clinical Pharmacy Specialist

## 2013-02-11 ENCOUNTER — Telehealth: Payer: Self-pay | Admitting: Internal Medicine

## 2013-02-11 MED ORDER — WARFARIN SODIUM 2.5 MG PO TABS: ORAL_TABLET | ORAL | Status: DC

## 2013-02-11 NOTE — Telephone Encounter (Signed)
New Prob   Pt is out of town and currently does not have any of his prescriptions. He states he needs a small quantity of Coumadin 2.5 mg called in until his son can overnight the rest of his pills. Please call.

## 2013-02-11 NOTE — Telephone Encounter (Signed)
Lm on machine to call back with drug store and the quantity that he needs.

## 2013-02-12 DIAGNOSIS — Z7901 Long term (current) use of anticoagulants: Secondary | ICD-10-CM | POA: Diagnosis not present

## 2013-02-12 DIAGNOSIS — Z5181 Encounter for therapeutic drug level monitoring: Secondary | ICD-10-CM | POA: Diagnosis not present

## 2013-02-12 LAB — PROTIME-INR: INR: 2.1 — AB (ref 0.9–1.1)

## 2013-02-12 NOTE — Telephone Encounter (Signed)
Will forward to [employer]

## 2013-02-12 NOTE — Telephone Encounter (Signed)
Waiting for lab results.

## 2013-02-13 ENCOUNTER — Ambulatory Visit (INDEPENDENT_AMBULATORY_CARE_PROVIDER_SITE_OTHER): Payer: Medicare Other | Admitting: Cardiovascular Disease

## 2013-02-13 DIAGNOSIS — I4891 Unspecified atrial fibrillation: Secondary | ICD-10-CM

## 2013-02-13 DIAGNOSIS — Z7901 Long term (current) use of anticoagulants: Secondary | ICD-10-CM

## 2013-02-18 ENCOUNTER — Other Ambulatory Visit: Payer: Self-pay | Admitting: Neurology

## 2013-02-18 NOTE — Telephone Encounter (Signed)
Per last office note patient to stay on medication

## 2013-02-25 ENCOUNTER — Ambulatory Visit (INDEPENDENT_AMBULATORY_CARE_PROVIDER_SITE_OTHER): Payer: Medicare Other

## 2013-02-25 ENCOUNTER — Ambulatory Visit (INDEPENDENT_AMBULATORY_CARE_PROVIDER_SITE_OTHER): Payer: Medicare Other | Admitting: *Deleted

## 2013-02-25 DIAGNOSIS — I4891 Unspecified atrial fibrillation: Secondary | ICD-10-CM | POA: Diagnosis not present

## 2013-02-25 DIAGNOSIS — J309 Allergic rhinitis, unspecified: Secondary | ICD-10-CM

## 2013-02-25 DIAGNOSIS — Z7901 Long term (current) use of anticoagulants: Secondary | ICD-10-CM

## 2013-02-25 DIAGNOSIS — Z5181 Encounter for therapeutic drug level monitoring: Secondary | ICD-10-CM | POA: Insufficient documentation

## 2013-02-25 LAB — POCT INR: INR: 2.5

## 2013-03-05 ENCOUNTER — Other Ambulatory Visit: Payer: Self-pay | Admitting: Pharmacist Clinician (PhC)/ Clinical Pharmacy Specialist

## 2013-03-05 MED ORDER — WARFARIN SODIUM 2.5 MG PO TABS: ORAL_TABLET | ORAL | Status: DC

## 2013-03-11 ENCOUNTER — Encounter: Payer: Medicare Other | Admitting: *Deleted

## 2013-03-14 ENCOUNTER — Encounter: Payer: Medicare Other | Admitting: *Deleted

## 2013-03-18 ENCOUNTER — Ambulatory Visit (INDEPENDENT_AMBULATORY_CARE_PROVIDER_SITE_OTHER): Payer: Medicare Other | Admitting: *Deleted

## 2013-03-18 ENCOUNTER — Ambulatory Visit (INDEPENDENT_AMBULATORY_CARE_PROVIDER_SITE_OTHER): Payer: Medicare Other

## 2013-03-18 DIAGNOSIS — I4891 Unspecified atrial fibrillation: Secondary | ICD-10-CM | POA: Diagnosis not present

## 2013-03-18 DIAGNOSIS — J309 Allergic rhinitis, unspecified: Secondary | ICD-10-CM

## 2013-03-18 DIAGNOSIS — Z7901 Long term (current) use of anticoagulants: Secondary | ICD-10-CM

## 2013-03-18 DIAGNOSIS — Z5181 Encounter for therapeutic drug level monitoring: Secondary | ICD-10-CM | POA: Diagnosis not present

## 2013-03-18 LAB — POCT INR: INR: 5.4

## 2013-03-25 ENCOUNTER — Other Ambulatory Visit: Payer: Self-pay | Admitting: Internal Medicine

## 2013-03-26 ENCOUNTER — Ambulatory Visit (INDEPENDENT_AMBULATORY_CARE_PROVIDER_SITE_OTHER): Payer: Medicare Other | Admitting: *Deleted

## 2013-03-26 DIAGNOSIS — I4891 Unspecified atrial fibrillation: Secondary | ICD-10-CM | POA: Diagnosis not present

## 2013-03-26 DIAGNOSIS — Z7901 Long term (current) use of anticoagulants: Secondary | ICD-10-CM

## 2013-03-26 DIAGNOSIS — Z5181 Encounter for therapeutic drug level monitoring: Secondary | ICD-10-CM | POA: Diagnosis not present

## 2013-03-26 LAB — POCT INR: INR: 2.7

## 2013-03-27 ENCOUNTER — Encounter: Payer: Self-pay | Admitting: *Deleted

## 2013-03-27 ENCOUNTER — Telehealth: Payer: Self-pay | Admitting: Internal Medicine

## 2013-03-27 MED ORDER — DOXYCYCLINE HYCLATE 100 MG PO TABS: ORAL_TABLET | ORAL | Status: DC

## 2013-03-27 MED ORDER — PREDNISONE 10 MG PO TABS: ORAL_TABLET | ORAL | Status: DC

## 2013-03-27 NOTE — Telephone Encounter (Signed)
Patient returning call.

## 2013-03-27 NOTE — Telephone Encounter (Signed)
Spoke with pt. Reports having issues with sinus/chest congestion x1 month. At times he has been coughing up green and bloody mucus. Denies chest tightness, SOB or wheezing. Has had a touch of diarrhea as well. No OTC medications have been taken. Would like CY's recs.  Allergies  Allergen Reactions  . Hydrocodone Other (See Comments)    Severe panic attacks  . Oxycodone Other (See Comments)    Severe panic attacks   Current Outpatient Prescriptions on File Prior to Visit  Medication Sig Dispense Refill  . acetaminophen (TYLENOL) 500 MG tablet Take 500 mg by mouth every 6 (six) hours as needed for pain. Per bottle as needed for pain      . albuterol (PROVENTIL HFA;VENTOLIN HFA) 108 (90 BASE) MCG/ACT inhaler Inhale 2 puffs into the lungs every 6 (six) hours as needed for shortness of breath.       Marland Kitchen amiodarone (PACERONE) 200 MG tablet Take 200 mg by mouth daily.       Marland Kitchen aspirin EC 81 MG tablet Take 81 mg by mouth every evening.       . donepezil (ARICEPT) 5 MG tablet TAKE ONE TABLET AT BEDTIME  30 tablet  2  . enoxaparin (LOVENOX) 80 MG/0.8ML injection Inject 80 mg into the skin every 12 (twelve) hours. Take as directed by Dr. Maurene Capes      . metoprolol succinate (TOPROL-XL) 50 MG 24 hr tablet Take 1 tablet (68m) in morning and take 1/2 tablet (282m in evening.  45 tablet  11  . mometasone (NASONEX) 50 MCG/ACT nasal spray TWO SPRAYS IN EACH NOSTRIL AS NEEDED      . MOVIPREP 100 G SOLR Take 1 kit (200 g total) by mouth as directed.  1 kit  0  . pantoprazole (PROTONIX) 40 MG tablet TAKE ONE TABLET BY MOUTH ONCE DAILY  30 tablet  5  . polyethylene glycol powder (GLYCOLAX/MIRALAX) powder Take 17 g by mouth daily.  527 g  3  . ramipril (ALTACE) 10 MG capsule Take 2 capsules (20 mg total) by mouth daily.  60 capsule  6  . rosuvastatin (CRESTOR) 5 MG tablet Take 5 mg by mouth at bedtime.       . sertraline (ZOLOFT) 50 MG tablet Take 1 tablet (50 mg total) by mouth daily.  30 tablet  3  .  spironolactone (ALDACTONE) 25 MG tablet Take 1 tablet (25 mg total) by mouth daily.  30 tablet  6  . warfarin (COUMADIN) 2.5 MG tablet Take 1 tablet by mouth daily or as directed  30 tablet  5  . zolpidem (AMBIEN) 5 MG tablet TAKE 1/2 TABLET AT BEDTIME AS NEEDED       No current facility-administered medications on file prior to visit.    CY - please advise. Thanks.

## 2013-03-27 NOTE — Telephone Encounter (Signed)
Attempted to call both #'s the pt left and didn't get an answer. Will try back.

## 2013-03-27 NOTE — Telephone Encounter (Signed)
Pt is aware of CY's recs. He would like both medications sent in. This has been done. Nothing further is needed.

## 2013-03-27 NOTE — Telephone Encounter (Signed)
Offer doxycycline 100 mg   # 8, 2 today, then one daily  Does he feel that prednisone would help? If so can send 10 mg, # 20, 4 X 2 DAYS, 3 X 2 DAYS, 2 X 2 DAYS, 1 X 2 DAYS

## 2013-03-28 ENCOUNTER — Ambulatory Visit (INDEPENDENT_AMBULATORY_CARE_PROVIDER_SITE_OTHER): Payer: Medicare Other | Admitting: Family Medicine

## 2013-03-28 ENCOUNTER — Encounter: Payer: Self-pay | Admitting: Family Medicine

## 2013-03-28 ENCOUNTER — Ambulatory Visit (INDEPENDENT_AMBULATORY_CARE_PROVIDER_SITE_OTHER)
Admission: RE | Admit: 2013-03-28 | Discharge: 2013-03-28 | Disposition: A | Discharge status: Home / Self Care | Payer: Medicare Other | Source: Ambulatory Visit | Attending: Family Medicine | Admitting: Family Medicine

## 2013-03-28 VITALS — BP 108/72 | Temp 97.3°F | Wt 188.0 lb

## 2013-03-28 DIAGNOSIS — I2510 Atherosclerotic heart disease of native coronary artery without angina pectoris: Secondary | ICD-10-CM | POA: Diagnosis not present

## 2013-03-28 DIAGNOSIS — J45909 Unspecified asthma, uncomplicated: Secondary | ICD-10-CM | POA: Diagnosis not present

## 2013-03-28 DIAGNOSIS — Z9109 Other allergy status, other than to drugs and biological substances: Secondary | ICD-10-CM

## 2013-03-28 DIAGNOSIS — Z889 Allergy status to unspecified drugs, medicaments and biological substances status: Secondary | ICD-10-CM

## 2013-03-28 DIAGNOSIS — R042 Hemoptysis: Secondary | ICD-10-CM | POA: Diagnosis not present

## 2013-03-28 DIAGNOSIS — J069 Acute upper respiratory infection, unspecified: Secondary | ICD-10-CM | POA: Diagnosis not present

## 2013-03-28 NOTE — Patient Instructions (Addendum)
-  take medications per your pulmonologist recommendations and follow up with him if symtoms worsen or persist  -chest xray today  -follow up in your coumadin clinic as sheduled or sooner if any further bleeding - see a doctor immediately if significant bleeding  -humidifier (make sure to follow use and cleaning instructions)

## 2013-03-28 NOTE — Progress Notes (Signed)
Chief Complaint  Patient presents with  . Nausea    HPI:  Acute visit for:  N/V/D: -about 1 month ago -this is resolved, lasted about 1 week  Asthma and allergies: -productive cough, congestion, nasal congestion for about 4 weeks and mild SOB, also few streaks of blood in mucus at times when blows nose and with cough  -sees pulmonologist for his asthma - on albuterol and nasonex -prescribed abx and prednisone by his pulmonologist but has not started these -wants a CXR -denies: fevers, struggling to breath, unexplained weight loss, persistent nausea, vomiting or diarrhea  ROS: See pertinent positives and negatives per HPI.  Past Medical History  Diagnosis Date  . Alcohol abuse, episodic 05/19/2008  . ALLERGIC RHINITIS 04/15/2009  . COLONIC POLYPS, ADENOMATOUS, HX OF 02/14/2008  . DIVERTICULOSIS, COLON 02/14/2008  . ESOPHAGEAL STRICTURE 01/28/2008  . GERD 02/14/2008  . HYPERGLYCEMIA 11/16/2006  . HYPERLIPIDEMIA 02/14/2008  . INSOMNIA-SLEEP DISORDER-UNSPEC 02/11/2009  . 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 PROSTATE CANCER, HX OF 02/25/2000  . TRANSIENT ISCHEMIC ATTACKS, HX OF 11/16/2006  . Arthritis   . Atrial fibrillation 11/16/2006    remote CHF related to atrial fib with rapid ventricular response over 25 yrs per office note,  . ASTHMA 11/16/2006    sinusitis-    hx ?yeast patch/white patch on vocal cord as per [facility] 02/25/11-   . OBSTRUCTIVE SLEEP APNEA 11/10/2008  . SLEEP APNEA 10/03/2009    LOV Dr [name] 12/12 in [facility]    Moderate per patient- settings ?6/last sleep study years ago  . HYPERTENSION 11/16/2006  . Symptomatic bradycardia, secondary to sinus node dysfunction 07/08/2011    s/p [facility] Scientific PPM by Dr Sallyanne Kuster, upgraded to [place] ICD 12/13 by Dr Rayann Heman (SJM)  . Myocardial infarction 1993  . Pacemaker     [employer] Jude  . ICD (implantable cardiac defibrillator) in place   . Anxiety   . Depression   . Pneumonia     hx of  . Headache(784.0)   . Cancer 2002   prostate cancer  . CORONARY ARTERY DISEASE 11/16/2006    LHC (07/2011): LAD with luminal irregularities, proximal circumflex 50%, EF 25%  . Chronic systolic CHF (congestive heart failure)     echo (10/02/12): EF 76-19%, grade 1 diastolic dysfunction, trivial AI, mild MR, mild RVE  . NICM (nonischemic cardiomyopathy)   . Stroke     Tia  . Lesion of vocal cord     bx begine    Past Surgical History  Procedure Laterality Date  . Tonsilectomy, adenoidectomy, bilateral myringotomy and tubes    . Knee arthroscopy      2 surgeries right and 1 left  . Prostate surgery  2/02    prostate cancer  . Uvulopalatopharyngoplasty  2013  . Esophagogastroduodenoscopy      with dilitation  . Lumbar laminectomy/decompression microdiscectomy  03/02/2011    Procedure: LUMBAR LAMINECTOMY/DECOMPRESSION MICRODISCECTOMY;  Surgeon: [name], MD;  Location: [facility];  Service: Orthopedics;  Laterality: N/A;  Decompression Lumbar 4 - Lumbar(X-Ray)  . Pacemaker insertion  06/2011    [employer] PPM implanted by Dr [employer]  . Cardiac defibrillator placement  01/16/12    Upgrade to a biventricular SJM ICD by DR Allred  . Tonsillectomy  as child  . Cardiac catheterization      several  . Back surgery    . Insert / replace / remove pacemaker    . Total knee arthroplasty Left 06/04/2012    Procedure: TOTAL KNEE  ARTHROPLASTY;  Surgeon: [name], MD;  Location: [facility];  Service: Orthopedics;  Laterality: Left;  . Colonoscopy    . Vocal cord lesion bx      Family History  Problem Relation Age of Onset  . Heart attack Father   . Heart attack Brother   . Heart disease Maternal Uncle   . Heart attack Maternal Uncle   . Colon cancer Neg Hx   . Esophageal cancer Neg Hx   . Rectal cancer Neg Hx   . Prostate cancer Neg Hx   . Stomach cancer Neg Hx     History   Social History  . Marital Status: Married    Spouse Name: N/A    Number of Children: N/A  . Years of Education: N/A   Social  History Main Topics  . Smoking status: Former Smoker -- 1.00 packs/day for 4 years    Types: Cigarettes    Quit date: 03/22/1964  . Smokeless tobacco: Never Used     Comment: reports smoked socially  . Alcohol Use: 0.0 oz/week     Comment: 2 mixed drinks daily, some days  . Drug Use: No  . Sexual Activity: None   Other Topics Concern  . None   Social History Narrative  . None    Current outpatient prescriptions:acetaminophen (TYLENOL) 500 MG tablet, Take 500 mg by mouth every 6 (six) hours as needed for pain. Per bottle as needed for pain, Disp: , Rfl: ;  albuterol (PROVENTIL HFA;VENTOLIN HFA) 108 (90 BASE) MCG/ACT inhaler, Inhale 2 puffs into the lungs every 6 (six) hours as needed for shortness of breath. , Disp: , Rfl: ;  amiodarone (PACERONE) 200 MG tablet, Take 200 mg by mouth daily. , Disp: , Rfl:  aspirin EC 81 MG tablet, Take 81 mg by mouth every evening. , Disp: , Rfl: ;  donepezil (ARICEPT) 5 MG tablet, TAKE ONE TABLET AT BEDTIME, Disp: 30 tablet, Rfl: 2;  doxycycline (VIBRA-TABS) 100 MG tablet, Take 2 tablets today, then 1 daily until gone, Disp: 8 tablet, Rfl: 0;  metoprolol succinate (TOPROL-XL) 50 MG 24 hr tablet, Take 1 tablet (50mg ) in morning and take 1/2 tablet (25mg ) in evening., Disp: 45 tablet, Rfl: 11 mometasone (NASONEX) 50 MCG/ACT nasal spray, TWO SPRAYS IN EACH NOSTRIL AS NEEDED, Disp: , Rfl: ;  pantoprazole (PROTONIX) 40 MG tablet, TAKE ONE TABLET BY MOUTH ONCE DAILY, Disp: 30 tablet, Rfl: 5;  polyethylene glycol powder (GLYCOLAX/MIRALAX) powder, Take 17 g by mouth daily., Disp: 527 g, Rfl: 3;  predniSONE (DELTASONE) 10 MG tablet, 4 X 2 DAYS, 3 X 2 DAYS, 2 X 2 DAYS, 1 X 2 DAYS, Disp: 20 tablet, Rfl: 0 ramipril (ALTACE) 10 MG capsule, Take 2 capsules (20 mg total) by mouth daily., Disp: 60 capsule, Rfl: 6;  rosuvastatin (CRESTOR) 5 MG tablet, Take 5 mg by mouth at bedtime. , Disp: , Rfl: ;  sertraline (ZOLOFT) 50 MG tablet, Take 1 tablet (50 mg total) by mouth daily.,  Disp: 30 tablet, Rfl: 3;  spironolactone (ALDACTONE) 25 MG tablet, Take 1 tablet (25 mg total) by mouth daily., Disp: 30 tablet, Rfl: 6 warfarin (COUMADIN) 2.5 MG tablet, Take 1 tablet by mouth daily or as directed, Disp: 30 tablet, Rfl: 5;  zolpidem (AMBIEN) 5 MG tablet, TAKE 1/2 TABLET AT BEDTIME AS NEEDED, Disp: , Rfl:   EXAM:  Filed Vitals:   03/28/13 1008  BP: 108/72  Temp: 97.3 F (36.3 C)    Body mass index is  25.49 kg/(m^2).  GENERAL: vitals reviewed and listed above, alert, oriented, appears well hydrated and in no acute distress  HEENT: atraumatic, conjunttiva clear, no obvious abnormalities on inspection of external nose and ears, normal appearance of ear canals and TMs, clear nasal congestion, mild post oropharyngeal erythema with PND, no tonsillar edema or exudate, no sinus TTP  NECK: no obvious masses on inspection  LUNGS: clear to auscultation bilaterally, no wheezes, rales or rhonchi, good air movement  CV: HRRR, no peripheral edema  MS: moves all extremities without noticeable abnormality  PSYCH: pleasant and cooperative, no obvious depression or anxiety  ASSESSMENT AND PLAN:  Discussed the following assessment and plan:  Asthma, chronic  Multiple allergies  Upper respiratory infection  Hemoptysis - Plan: DG Chest 2 View  -suspect viral illness (query mild flu) with possible sinusitis as sequelue, unsure of reason for visit today as addressed by pulmonologist and they are following up with pulmonologist -sm amount of blood in mucus when blows nose is likely from small crack in nasal mucosa - CXR peniding and if abnormal they will notify their pulmonologist -they have appt set to follow up in coumadin clinic -medication risks and return ED precautions discussed -Patient advised to return or notify a doctor immediately if symptoms worsen or persist or new concerns arise.  Patient Instructions  -take medications per your pulmonologist recommendations and  follow up with him if symtoms worsen or persist  -chest xray today  -follow up in your coumadin clinic as sheduled or sooner if any further bleeding - see a doctor immediately if significant bleeding  -humidifier (make sure to follow use and cleaning instructions)     Corey Benton R.

## 2013-03-28 NOTE — Progress Notes (Signed)
Pre visit review using our clinic review tool, if applicable. No additional management support is needed unless otherwise documented below in the visit note. 

## 2013-04-04 ENCOUNTER — Encounter: Payer: Medicare Other | Admitting: *Deleted

## 2013-04-04 ENCOUNTER — Ambulatory Visit (INDEPENDENT_AMBULATORY_CARE_PROVIDER_SITE_OTHER): Payer: Medicare Other

## 2013-04-04 DIAGNOSIS — J309 Allergic rhinitis, unspecified: Secondary | ICD-10-CM

## 2013-04-10 ENCOUNTER — Telehealth: Payer: Self-pay | Admitting: Internal Medicine

## 2013-04-10 ENCOUNTER — Ambulatory Visit (INDEPENDENT_AMBULATORY_CARE_PROVIDER_SITE_OTHER): Payer: Medicare Other | Admitting: *Deleted

## 2013-04-10 DIAGNOSIS — Z7901 Long term (current) use of anticoagulants: Secondary | ICD-10-CM

## 2013-04-10 DIAGNOSIS — Z5181 Encounter for therapeutic drug level monitoring: Secondary | ICD-10-CM

## 2013-04-10 DIAGNOSIS — I4891 Unspecified atrial fibrillation: Secondary | ICD-10-CM | POA: Diagnosis not present

## 2013-04-10 LAB — POCT INR: INR: 4.2

## 2013-04-10 NOTE — Telephone Encounter (Signed)
New message    Patient calling has questions regarding medication.     Patient think he did not take his medication last night. Wants to know should he double or wait until pm.

## 2013-04-10 NOTE — Telephone Encounter (Signed)
Spoke with patient.  He is almost positive he missed his pm medications last night.  I told him to take his medications as usual this am and then get dack on track.  He verbalized understanding and will call if he has problems.  He had a long trip and forgot his meds last pm.

## 2013-04-11 ENCOUNTER — Ambulatory Visit: Payer: Medicare Other

## 2013-04-17 ENCOUNTER — Ambulatory Visit (INDEPENDENT_AMBULATORY_CARE_PROVIDER_SITE_OTHER): Payer: Medicare Other | Admitting: Pharmacist

## 2013-04-17 DIAGNOSIS — I4891 Unspecified atrial fibrillation: Secondary | ICD-10-CM | POA: Diagnosis not present

## 2013-04-17 DIAGNOSIS — Z7901 Long term (current) use of anticoagulants: Secondary | ICD-10-CM

## 2013-04-17 DIAGNOSIS — Z5181 Encounter for therapeutic drug level monitoring: Secondary | ICD-10-CM | POA: Diagnosis not present

## 2013-04-17 LAB — POCT INR: INR: 3.2

## 2013-04-19 ENCOUNTER — Ambulatory Visit (INDEPENDENT_AMBULATORY_CARE_PROVIDER_SITE_OTHER): Payer: Medicare Other

## 2013-04-19 DIAGNOSIS — J309 Allergic rhinitis, unspecified: Secondary | ICD-10-CM

## 2013-04-25 ENCOUNTER — Encounter: Payer: Self-pay | Admitting: *Deleted

## 2013-04-25 ENCOUNTER — Ambulatory Visit: Payer: Medicare Other

## 2013-05-01 ENCOUNTER — Other Ambulatory Visit: Payer: Self-pay | Admitting: Neurology

## 2013-05-01 ENCOUNTER — Ambulatory Visit (INDEPENDENT_AMBULATORY_CARE_PROVIDER_SITE_OTHER): Payer: Medicare Other | Admitting: Pharmacist

## 2013-05-01 DIAGNOSIS — Z5181 Encounter for therapeutic drug level monitoring: Secondary | ICD-10-CM

## 2013-05-01 DIAGNOSIS — I4891 Unspecified atrial fibrillation: Secondary | ICD-10-CM

## 2013-05-01 DIAGNOSIS — Z7901 Long term (current) use of anticoagulants: Secondary | ICD-10-CM | POA: Diagnosis not present

## 2013-05-01 LAB — POCT INR: INR: 3.1

## 2013-05-06 ENCOUNTER — Ambulatory Visit (INDEPENDENT_AMBULATORY_CARE_PROVIDER_SITE_OTHER): Payer: Medicare Other

## 2013-05-06 DIAGNOSIS — J309 Allergic rhinitis, unspecified: Secondary | ICD-10-CM

## 2013-05-08 ENCOUNTER — Ambulatory Visit (INDEPENDENT_AMBULATORY_CARE_PROVIDER_SITE_OTHER): Payer: Medicare Other | Admitting: *Deleted

## 2013-05-08 ENCOUNTER — Encounter: Payer: Self-pay | Admitting: Internal Medicine

## 2013-05-08 DIAGNOSIS — I495 Sick sinus syndrome: Secondary | ICD-10-CM | POA: Diagnosis not present

## 2013-05-08 DIAGNOSIS — I498 Other specified cardiac arrhythmias: Secondary | ICD-10-CM

## 2013-05-08 DIAGNOSIS — I5022 Chronic systolic (congestive) heart failure: Secondary | ICD-10-CM

## 2013-05-08 DIAGNOSIS — R001 Bradycardia, unspecified: Secondary | ICD-10-CM

## 2013-05-08 DIAGNOSIS — I4891 Unspecified atrial fibrillation: Secondary | ICD-10-CM

## 2013-05-08 LAB — MDC_IDC_ENUM_SESS_TYPE_REMOTE
Battery Remaining Percentage: 80 %
Battery Voltage: 2.98 V
Brady Statistic AP VP Percent: 99 %
Brady Statistic AP VS Percent: 1 %
Brady Statistic AS VP Percent: 1 %
Brady Statistic RA Percent Paced: 99 %
Date Time Interrogation Session: 20150415060022
HIGH POWER IMPEDANCE MEASURED VALUE: 68 Ohm
HighPow Impedance: 68 Ohm
Implantable Pulse Generator Model: 3265
Lead Channel Impedance Value: 430 Ohm
Lead Channel Impedance Value: 540 Ohm
Lead Channel Impedance Value: 990 Ohm
Lead Channel Pacing Threshold Amplitude: 0.75 V
Lead Channel Pacing Threshold Amplitude: 1 V
Lead Channel Pacing Threshold Amplitude: 1 V
Lead Channel Pacing Threshold Pulse Width: 0.5 ms
Lead Channel Pacing Threshold Pulse Width: 0.5 ms
Lead Channel Sensing Intrinsic Amplitude: 2.1 mV
Lead Channel Setting Pacing Amplitude: 2 V
Lead Channel Setting Pacing Amplitude: 2 V
Lead Channel Setting Pacing Amplitude: 2.5 V
MDC IDC MSMT BATTERY REMAINING LONGEVITY: 65 mo
MDC IDC MSMT LEADCHNL RA PACING THRESHOLD PULSEWIDTH: 0.5 ms
MDC IDC MSMT LEADCHNL RV SENSING INTR AMPL: 5.9 mV
MDC IDC PG SERIAL: 7057550
MDC IDC SET LEADCHNL LV PACING PULSEWIDTH: 0.5 ms
MDC IDC SET LEADCHNL RV PACING PULSEWIDTH: 0.5 ms
MDC IDC SET LEADCHNL RV SENSING SENSITIVITY: 0.5 mV
MDC IDC SET ZONE DETECTION INTERVAL: 270 ms
MDC IDC SET ZONE DETECTION INTERVAL: 340 ms
MDC IDC STAT BRADY AS VS PERCENT: 1 %

## 2013-05-13 ENCOUNTER — Ambulatory Visit (INDEPENDENT_AMBULATORY_CARE_PROVIDER_SITE_OTHER): Payer: Medicare Other | Admitting: Pharmacist

## 2013-05-13 DIAGNOSIS — M545 Low back pain, unspecified: Secondary | ICD-10-CM | POA: Diagnosis not present

## 2013-05-13 DIAGNOSIS — Z7901 Long term (current) use of anticoagulants: Secondary | ICD-10-CM

## 2013-05-13 DIAGNOSIS — I4891 Unspecified atrial fibrillation: Secondary | ICD-10-CM | POA: Diagnosis not present

## 2013-05-13 DIAGNOSIS — M1710 Unilateral primary osteoarthritis, unspecified knee: Secondary | ICD-10-CM | POA: Diagnosis not present

## 2013-05-13 DIAGNOSIS — Z5181 Encounter for therapeutic drug level monitoring: Secondary | ICD-10-CM

## 2013-05-13 LAB — POCT INR: INR: 2

## 2013-05-17 ENCOUNTER — Encounter: Payer: Self-pay | Admitting: *Deleted

## 2013-05-20 ENCOUNTER — Encounter: Payer: Self-pay | Admitting: Internal Medicine

## 2013-05-20 ENCOUNTER — Ambulatory Visit (INDEPENDENT_AMBULATORY_CARE_PROVIDER_SITE_OTHER): Payer: Medicare Other | Admitting: Internal Medicine

## 2013-05-20 VITALS — BP 120/76 | HR 94 | Ht 72.0 in | Wt 192.0 lb

## 2013-05-20 DIAGNOSIS — I2510 Atherosclerotic heart disease of native coronary artery without angina pectoris: Secondary | ICD-10-CM

## 2013-05-20 DIAGNOSIS — G4733 Obstructive sleep apnea (adult) (pediatric): Secondary | ICD-10-CM

## 2013-05-20 DIAGNOSIS — J3089 Other allergic rhinitis: Secondary | ICD-10-CM

## 2013-05-20 DIAGNOSIS — Z85828 Personal history of other malignant neoplasm of skin: Secondary | ICD-10-CM | POA: Diagnosis not present

## 2013-05-20 DIAGNOSIS — J309 Allergic rhinitis, unspecified: Secondary | ICD-10-CM

## 2013-05-20 DIAGNOSIS — J302 Other seasonal allergic rhinitis: Secondary | ICD-10-CM

## 2013-05-20 DIAGNOSIS — T300 Burn of unspecified body region, unspecified degree: Secondary | ICD-10-CM | POA: Diagnosis not present

## 2013-05-20 NOTE — Progress Notes (Signed)
11/01/10-78 year old male with remote smoking history, followed for chronic asthmatic bronchitis/bronchiectasis, history of sinusitis, allergic rhinitis, sleep apnea Last here 03/15/10 He wants to wait until November for flu shot. He has not had recent or acute symptoms related to his breathing and has not used Advair in 6 months. He didn't find it helpful. Rare use of Proair, maybe once every 3-4 months. He continues allergy vaccine, missing a dose sometimes if he is away from town. He notices no problems and offers no questions. He continues to use CPAP all night every night. He had had a palatoplasty he doesn't remember when and wonders if this was done at the time of his tonsillectomy as a Vaishali Baise child. He notices persistent narrowness through the right nostril related to his septal deviation. Nasonex helps. We discussed a trial of Breathe Right strips to be used under his CPAP mask. He can feel when his atrial fibrillation is more active, as in the past 2 days. He treats his difficulty initiating and maintaining sleep with a one half of a 5 mg Ambien tablet when needed.   11/01/10-78 year old male with remote smoking history, followed for chronic asthmatic bronchitis/bronchiectasis, history of sinusitis, allergic rhinitis, sleep apnea Walked in today as an acute unscheduled visit complaining of cough since lunchtime. He got a throat tickle walking to his car and got gradually worse. Persistent for 4 hours with chest congestion. Chronic feeling of raspy throat clearing. Orthopedic knee surgery one month ago/Dr. Tonita Cong. Oxycodone given in hospital cause shaking, insomnia, panic attack. When he got home he took some leftover hydrocodone and had the same experience, up all night, pacing and anxious. This is set off his chronic anxiety. Dr Tonita Cong called in lorazepam- helps. Continues CPAP all night every night.  05/16/11- 78 year old male with remote smoking history, followed for chronic asthmatic  bronchitis/bronchiectasis, history of sinusitis, allergic rhinitis, sleep apnea, AFib/ CAD/ MI, GERD He admits his anxiety is getting to him. 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 Apparently he was put to sleep with intention to remove the lesion but they found they could not. He is left persistently hoarse.  He had just had back surgery 8 weeks previously and has been taking oxycodone for back pain. Lorazepam helps him sleep at night.  CPAP continues all night every night with good control. 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  11/03/11- 78 year old male with remote smoking history, followed for chronic asthmatic bronchitis/bronchiectasis, history of sinusitis, allergic rhinitis, sleep apnea, AFib/ CAD/ MI, GERD He wants to wait until November for flu shot. Continues allergy vaccine 1:10 GH. He has not had much respiratory problem until the last one or 2 weeks when he began throat clearing. Has used his rescue inhaler only once or twice. In February he had spine surgery for spinal stenosis. Oxycodone caused panic attack lasting 3 months. Then had a vocal cord biopsy at [facility]. Exacerbation of atrial fibrillation treated with pacemaker in June, then amiodarone. He still feels more short of breath than normal but is starting cardiac rehabilitation. CPAP/ [facility].  12/26/11-  78 year old male with remote smoking history, followed for chronic asthmatic bronchitis/bronchiectasis, history of sinusitis, allergic rhinitis, sleep apnea, AFib/ CAD/ MI, GERD     Wife here FOLLOWS FOR: on last dose of Amoxicillin(dx'd with PNA at hospital 12-16-11; feels somewhat better but not 100%.  Still on allergy vaccine. Acute  illness is now improving. Antibiotic ends today. No fever, minimal phlegm, throat tickle, shortness of breath with sustained speech. Denies cough or wheeze. CXR  12/18/11- reviewed IMPRESSION:  Improved right lower lobe airspace disease. Small right effusion  noted.  Original Report Authenticated By: Orlean Patten, M.D.    03/29/2012 Acute OV  Complains of sneezing, runny nose with clear drainage, watery eyes, increased saliva x 6 months .  Has multiple complaints since surgery last year for pacemaker and back surgery.  Has significant drippy nose and post nasal drainage. Mainly clear drainage. Has to clear throat.  Has dry cough on/off  Does gets choked on saliva and thin liquids.  No significant dysphagia at present. Marland Kitchen Has had previous esophageal dilatation.  Has excessive saliva intermittently.  Gets weekly allergy shots.  Started on Dymista -did not use but once because had a headache.  No discolored mucus, fever, chest pain or edema.  >>rec to stop ACE , rx claritin   04/30/2012 Acute OV  Complains of prod cough with yellow mucus, increased SOB x 8 days.  Recently on vacation on cruise.  Patient reports that he started coughing. Prior to beginning his vacation. And cough is progressively gotten worse. He's been taking over-the-counter cold products, and Tylenol without much relief. He denies any hemoptysis, orthopnea, chest pain, palpitations, syncope Patient was recommended to have his ACE inhibitor  Change at last visit. However, patient reports that his doctor does not feel like this is causing his cough and his dose was increased.  05/16/12- 78 year old male with remote smoking history, followed for chronic asthmatic bronchitis/bronchiectasis, history of sinusitis, allergic rhinitis, sleep apnea, AFib/ CAD/ MI, GERD   FOLLOWS FOR: still on vaccine and doing well; no major flare ups at this time. Describes pain in the left mid back intermittent x10 years. His primary physician has thought it was arthritis and referred for physical therapy which starts tomorrow. History of spinal stenosis surgery. He had an acute bronchitis at last office  visit, responding to Augmentin, prednisone and Mucinex DM. He continues allergy vaccine 1:10 GH. CXR 05/01/12 *RADIOLOGY REPORT*  Clinical Data: Cough and shortness of breath.  CHEST - 2 VIEW  Comparison: 01/17/2012  Findings: Stable appearance of the left cardiac ICD leads. The  lungs are clear without airspace disease or edema. Stable  appearance of the heart and mediastinum. Stable densities or  calcifications in the left mid lung region.  IMPRESSION:  No active cardiopulmonary disease.  Original Report Authenticated By: Markus Daft, M.D.  11/19/12- 78 year old male with remote smoking history, followed for chronic asthmatic bronchitis/bronchiectasis, history of sinusitis, allergic rhinitis, OSA/ CPAP/ UPPP, AFib/ CAD/ MI, GERD   FOLLOWS FOR: still on vaccine-has missed 2 weeks due to traveling. DME is [facility]. Wears CPAP every night for about 8-9 hours; pressure working well for patient. Okay with CPAP/[facility] 5-15. Old machine has burned out and needs replacement Continues allergy vaccine 1:10 GH  05/20/13- 78 year old male with remote smoking history, followed for chronic asthmatic bronchitis/bronchiectasis, history of sinusitis, allergic rhinitis, OSA/ CPAP/ UPPP, AFib/ CAD/ MI, GERD   FOLLOWS FOR: Breathing is doing well, no concerns.  Doing well on allergy vaccine 1:10 GH.  Wearing CPAP Auto 5-15/ [facility] 8 hours per night. Old CPAP machine works fine. He got a new machine but never got used to it so he returned to his old one.  ROS-see HPI Constitutional:   No-   weight loss, night sweats, fevers, chills, fatigue, lassitude. HEENT:   No-  headaches, difficulty swallowing, tooth/dental problems, sore throat,       No-  sneezing, itching, ear ache, nasal congestion, post nasal drip,  CV:  No-   chest pain, orthopnea, PND, swelling in lower extremities, anasarca,  dizziness, palpitations Resp: No-   shortness of breath with exertion or at rest.              No-   productive cough,   No non-productive cough,  No- coughing up of blood.              No-   change in color of mucus.  No- wheezing.   Skin: No-   rash or lesions. GI:  No-   heartburn, indigestion, abdominal pain, nausea, vomiting, GU:  MS:  No-   joint pain or swelling.  .  + back pain. Neuro-     nothing unusual Psych:  No- change in mood or affect. No depression or anxiety.  No memory loss.  OBJ- Physical Exam General- Alert, Oriented, Affect-appropriate, Distress- none acute Skin- rash-none, lesions- none, excoriation- none. +Bandage over nose- Moh's surgery Lymphadenopathy- none Head- atraumatic            Eyes- Gross vision intact, PERRLA, conjunctivae and secretions clear            Ears- Hearing, canals-normal            Nose- Clear, no-Septal dev, mucus, polyps, erosion, perforation             Throat- + UPPP/ Mallampati II , +frequent throat clearing without visible drainage ,                          tonsils- atrophic Neck- flexible , trachea midline, no stridor , thyroid nl, carotid no bruit Chest - symmetrical excursion , unlabored           Heart/CV- RRR , no murmur , no gallop  , no rub, nl s1 s2                           - JVD- none , edema- none, stasis changes- none, varices- none           Lung- clear, wheeze- none, cough- none , dullness-none, rub- none           Chest wall- L pacemaker Abd- Br/ Gen/ Rectal- Not done, not indicated Extrem- cyanosis- none, clubbing, none, atrophy- none, strength- nl Neuro- grossly intact to observation

## 2013-05-20 NOTE — Patient Instructions (Signed)
Ok to continue allergy vaccine 1:10 GO   Suggest you take your new CPAP machine to [facility] and tell them what it has been doing that didn't seem right.  Try otc Nasacort inhaler as an alternative to Nasonex. If it isn't ok, then we will try to see what your insurance will cover.

## 2013-05-22 ENCOUNTER — Telehealth: Payer: Self-pay | Admitting: Internal Medicine

## 2013-05-22 NOTE — Telephone Encounter (Signed)
I spoke with the pt and the medication that he possibly took twice this morning was Amiodarone, not metoprolol. I made the pt aware that he needs to monitor his pulse today and if his pulse drops below 50 and if he becomes dizzy or has other symptoms then he should seek evaluation.  The pt does know how to count his radial pulse. I advised the pt to call the office with any other questions or concerns.

## 2013-05-22 NOTE — Telephone Encounter (Signed)
New problem   Pt stated he may have taken his metroprol twice this morning and need to speak to someone ASAP because he is getting ready to get on a plane

## 2013-05-22 NOTE — Telephone Encounter (Signed)
Left message for pt to call back  °

## 2013-05-30 ENCOUNTER — Other Ambulatory Visit: Payer: Self-pay | Admitting: Dermatology

## 2013-05-30 DIAGNOSIS — C44211 Basal cell carcinoma of skin of unspecified ear and external auricular canal: Secondary | ICD-10-CM | POA: Diagnosis not present

## 2013-05-30 DIAGNOSIS — L570 Actinic keratosis: Secondary | ICD-10-CM | POA: Diagnosis not present

## 2013-05-30 DIAGNOSIS — Z85828 Personal history of other malignant neoplasm of skin: Secondary | ICD-10-CM | POA: Diagnosis not present

## 2013-05-30 DIAGNOSIS — C44319 Basal cell carcinoma of skin of other parts of face: Secondary | ICD-10-CM | POA: Diagnosis not present

## 2013-05-30 DIAGNOSIS — D1801 Hemangioma of skin and subcutaneous tissue: Secondary | ICD-10-CM | POA: Diagnosis not present

## 2013-05-30 DIAGNOSIS — L821 Other seborrheic keratosis: Secondary | ICD-10-CM | POA: Diagnosis not present

## 2013-05-30 DIAGNOSIS — L738 Other specified follicular disorders: Secondary | ICD-10-CM | POA: Diagnosis not present

## 2013-06-03 ENCOUNTER — Ambulatory Visit (INDEPENDENT_AMBULATORY_CARE_PROVIDER_SITE_OTHER): Payer: Medicare Other | Admitting: Surgery

## 2013-06-03 DIAGNOSIS — I4891 Unspecified atrial fibrillation: Secondary | ICD-10-CM

## 2013-06-03 DIAGNOSIS — Z5181 Encounter for therapeutic drug level monitoring: Secondary | ICD-10-CM | POA: Diagnosis not present

## 2013-06-03 DIAGNOSIS — Z7901 Long term (current) use of anticoagulants: Secondary | ICD-10-CM

## 2013-06-03 LAB — POCT INR: INR: 2.9

## 2013-06-06 ENCOUNTER — Ambulatory Visit (INDEPENDENT_AMBULATORY_CARE_PROVIDER_SITE_OTHER): Payer: Medicare Other

## 2013-06-06 DIAGNOSIS — J309 Allergic rhinitis, unspecified: Secondary | ICD-10-CM | POA: Diagnosis not present

## 2013-06-10 ENCOUNTER — Encounter: Payer: Self-pay | Admitting: Neurology

## 2013-06-10 ENCOUNTER — Ambulatory Visit (INDEPENDENT_AMBULATORY_CARE_PROVIDER_SITE_OTHER): Payer: Medicare Other | Admitting: Neurology

## 2013-06-10 VITALS — BP 130/78 | HR 68 | Temp 98.0°F | Resp 16 | Ht 72.0 in | Wt 191.8 lb

## 2013-06-10 DIAGNOSIS — I2510 Atherosclerotic heart disease of native coronary artery without angina pectoris: Secondary | ICD-10-CM

## 2013-06-10 DIAGNOSIS — R413 Other amnesia: Secondary | ICD-10-CM

## 2013-06-10 DIAGNOSIS — C44211 Basal cell carcinoma of skin of unspecified ear and external auricular canal: Secondary | ICD-10-CM | POA: Diagnosis not present

## 2013-06-10 DIAGNOSIS — Z85828 Personal history of other malignant neoplasm of skin: Secondary | ICD-10-CM | POA: Diagnosis not present

## 2013-06-10 DIAGNOSIS — G3184 Mild cognitive impairment, so stated: Secondary | ICD-10-CM

## 2013-06-10 NOTE — Patient Instructions (Signed)
You are doing well. I would continue the Aricept 5mg  at bedtime for now.  When you return to [facility] in August, call us and we will increase the Aricept to 10mg  at bedtime. Start a regular routine to follow, including scheduled meals, regular exercise, bedtime and time to get up in the morning.  I stress the exercise to help with your overall health, including memory and sleep habits.  Try not to nap during the day. Follow up in 6 months or as needed.

## 2013-06-10 NOTE — Progress Notes (Addendum)
NEUROLOGY FOLLOW UP OFFICE NOTE  Corey [name] [id number]  HISTORY OF PRESENT ILLNESS: Corey [name] is a 78 year old right-handed man with history of atrial fibrillation with PM, MI and CAD, TIA, hypertension, OSA, hyperlipidemia, alcohol abuse, and history of prostate cancer who follows up for amnestic MCI.  He is accompanied by his wife.  Records were reviewed.    UPDATE: 01/09/13 LABS:  B12 285, methylmalonic acid 0.14 01/11/13 CT Head:  small vessel disease with remote infarcts in the left temporal lobe and left cerebellum, with accompanying diffuse atrophy  Initiated Aricept 5mg .  experience vivid dreams initially but this soon resolved. He and his wife noticed some possible improvement in memory. He has been doing well and is stable from a cognitive point of view. He still has ongoing nausea since his surgeries. Routine colonoscopy has not revealed anything and he has routine followups with his gastroenterologist. They have been very social and he and his wife tried to see friends are having him over for dinner. However, he spends a lot of time still sitting around the house. He takes frequent naps during the day because he feels tired. However, he does Corey up often at night time. He does not exercise, mostly because he says he physically doesn't feel he is able to since the surgeries.  In the next few days, he and his wife are going to take a extended trip. They will be going to [place] and [place]. They will not be back to [place] until August.  HISTORY: His wife and daughters note some mild memory problems for a little while, but has significantly been more noticeable over the past 5 months, since he retired in June.  Memory has been the primary problem.  He was called his daughters several times in a day asking about the same thing.  He still drives but is now mildly disoriented driving around home.  There has been no accidents or near-accidents, but he is more irritable and  impulsive, and will drive a little faster and tail gate.  He has poor night vision.  They have a new grandson.  Recently, he forgot about a party for his new grandchild and instead made plans with friends.  He has begun to forget names of family and friends.  He has not had any significant change in personality other than irritability.  Besides the aggressive driving, he uses foul language a little more.  He does not act inappropriately.  He does feel depressed, as he has been going through an adjustment since retiring.  He has had more falls recently.  One time, the rug slipped from underneath him.  Another time, he tripped over a hose at the gas station.  He also had experienced bowel incontinence.  He has not had any problems at work prior to retirement.  He has not had hallucinations or delusions. He has not had hallucinations or delusions.  He has not had difficulty performing everyday tasks.  He notes some problems but doesn't think it is as bad as his family makes it out to be.    He worked in [employer] for many years.  Since retirement, he typically doesn't do too much.  He will sit around.  He doesn't watch much TV during the day.  He reads the newspaper daily and able to retain information.  He is able to stay focus.  He and his wife are not as social as they used to be.    His mother had  dementia.   Over the past year or so, he has had several operations, including pacemaker and back surgery.  He attributes these changes to his multiple surgeries.  01/09/13 MOCA 25/30 for impaired delayed recall.  PAST MEDICAL HISTORY: Past Medical History  Diagnosis Date  . Alcohol abuse, episodic 05/19/2008  . ALLERGIC RHINITIS 04/15/2009  . COLONIC POLYPS, ADENOMATOUS, HX OF 02/14/2008  . DIVERTICULOSIS, COLON 02/14/2008  . ESOPHAGEAL STRICTURE 01/28/2008  . GERD 02/14/2008  . HYPERGLYCEMIA 11/16/2006  . HYPERLIPIDEMIA 02/14/2008  . INSOMNIA-SLEEP DISORDER-UNSPEC 02/11/2009  . Other testicular hypofunction  08/26/2009  . PROSTATE CANCER, HX OF 02/25/2000  . TRANSIENT ISCHEMIC ATTACKS, HX OF 11/16/2006  . Arthritis   . Atrial fibrillation 11/16/2006    remote CHF related to atrial fib with rapid ventricular response over 25 yrs per office note,  . ASTHMA 11/16/2006    sinusitis-    hx ?yeast patch/white patch on vocal cord as per [facility] 02/25/11-   . OBSTRUCTIVE SLEEP APNEA 11/10/2008  . SLEEP APNEA 10/03/2009    LOV Dr [name] 12/12 in [facility]    Moderate per patient- settings ?6/last sleep study years ago  . HYPERTENSION 11/16/2006  . Symptomatic bradycardia, secondary to sinus node dysfunction 07/08/2011    s/p [facility] Scientific PPM by Dr Sallyanne Kuster, upgraded to [place] ICD 12/13 by Dr Rayann Heman ([facility])  . Myocardial infarction 1993  . Pacemaker     St. Jude  . ICD (implantable cardiac defibrillator) in place   . Anxiety   . Depression   . Pneumonia     hx of  . Headache(784.0)   . Cancer 2002    prostate cancer  . CORONARY ARTERY DISEASE 11/16/2006    LHC (07/2011): LAD with luminal irregularities, proximal circumflex 50%, EF 25%  . Chronic systolic CHF (congestive heart failure)     echo (10/02/12): EF 73-41%, grade 1 diastolic dysfunction, trivial AI, mild MR, mild RVE  . NICM (nonischemic cardiomyopathy)   . Stroke     Tia  . Lesion of vocal cord     bx begine    MEDICATIONS: Current Outpatient Prescriptions on File Prior to Visit  Medication Sig Dispense Refill  . acetaminophen (TYLENOL) 500 MG tablet Take 500 mg by mouth every 6 (six) hours as needed for pain. Per bottle as needed for pain      . albuterol (PROVENTIL HFA;VENTOLIN HFA) 108 (90 BASE) MCG/ACT inhaler Inhale 2 puffs into the lungs every 6 (six) hours as needed for shortness of breath.       Marland Kitchen amiodarone (PACERONE) 200 MG tablet Take 200 mg by mouth daily.       Marland Kitchen aspirin EC 81 MG tablet Take 81 mg by mouth every evening.       . donepezil (ARICEPT) 5 MG tablet TAKE ONE TABLET AT BEDTIME  30 tablet  2  . metoprolol succinate  (TOPROL-XL) 50 MG 24 hr tablet Take 1 tablet (50mg ) in morning and take 1/2 tablet (25mg ) in evening.  45 tablet  11  . mometasone (NASONEX) 50 MCG/ACT nasal spray TWO SPRAYS IN EACH NOSTRIL AS NEEDED      . pantoprazole (PROTONIX) 40 MG tablet TAKE ONE TABLET BY MOUTH ONCE DAILY  30 tablet  5  . polyethylene glycol powder (GLYCOLAX/MIRALAX) powder Take 17 g by mouth daily.  527 g  3  . predniSONE (DELTASONE) 10 MG tablet 4 X 2 DAYS, 3 X 2 DAYS, 2 X 2 DAYS, 1 X 2 DAYS  20 tablet  0  . ramipril (ALTACE) 10 MG capsule Take 2 capsules (20 mg total) by mouth daily.  60 capsule  6  . rosuvastatin (CRESTOR) 5 MG tablet Take 5 mg by mouth at bedtime.       . sertraline (ZOLOFT) 50 MG tablet Take 1 tablet (50 mg total) by mouth daily.  30 tablet  3  . spironolactone (ALDACTONE) 25 MG tablet Take 1 tablet (25 mg total) by mouth daily.  30 tablet  6  . warfarin (COUMADIN) 2.5 MG tablet Take 1 tablet by mouth daily or as directed  30 tablet  5  . zolpidem (AMBIEN) 5 MG tablet TAKE 1/2 TABLET AT BEDTIME AS NEEDED       No current facility-administered medications on file prior to visit.    ALLERGIES: Allergies  Allergen Reactions  . Hydrocodone Other (See Comments)    Severe panic attacks  . Oxycodone Other (See Comments)    Severe panic attacks    FAMILY HISTORY: Family History  Problem Relation Age of Onset  . Heart attack Father   . Heart attack Brother   . Heart disease Maternal Uncle   . Heart attack Maternal Uncle   . Colon cancer Neg Hx   . Esophageal cancer Neg Hx   . Rectal cancer Neg Hx   . Prostate cancer Neg Hx   . Stomach cancer Neg Hx     SOCIAL HISTORY: History   Social History  . Marital Status: Married    Spouse Name: N/A    Number of Children: N/A  . Years of Education: N/A   Occupational History  . Not on file.   Social History Main Topics  . Smoking status: Former Smoker -- 1.00 packs/day for 4 years    Types: Cigarettes    Quit date: 03/22/1964  .  Smokeless tobacco: Never Used     Comment: reports smoked socially  . Alcohol Use: 0.0 oz/week     Comment: 2 mixed drinks daily, some days  . Drug Use: No  . Sexual Activity: Yes    Partners: Female   Other Topics Concern  . Not on file   Social History Narrative  . No narrative on file    REVIEW OF SYSTEMS: Constitutional: No fevers, chills, or sweats, no generalized fatigue, change in appetite Eyes: No visual changes, double vision, eye pain Ear, nose and throat: No hearing loss, ear pain, nasal congestion, sore throat Cardiovascular: No chest pain, palpitations Respiratory:  No shortness of breath at rest or with exertion, wheezes GastrointestinaI: No nausea, vomiting, diarrhea, abdominal pain, fecal incontinence Genitourinary:  No dysuria, urinary retention or frequency Musculoskeletal:  No neck pain, back pain Integumentary: No rash, pruritus, skin lesions Neurological: as above Psychiatric: No depression, insomnia, anxiety Endocrine: No palpitations, fatigue, diaphoresis, mood swings, change in appetite, change in weight, increased thirst Hematologic/Lymphatic:  No anemia, purpura, petechiae. Allergic/Immunologic: no itchy/runny eyes, nasal congestion, recent allergic reactions, rashes  PHYSICAL EXAM: Filed Vitals:   06/10/13 0853  BP: 130/78  Pulse: 68  Temp: 98 F (36.7 C)  Resp: 16   General: No acute distress Head:  Normocephalic/atraumatic Neck: supple, no paraspinal tenderness, full range of motion Heart:  Regular rate and rhythm Lungs:  Clear to auscultation bilaterally Back: No paraspinal tenderness Neurological Exam: alert and oriented to person, place, and time. Attention span and concentration intact, recent and remote memory intact, fund of knowledge intact.  One calculation during serial 7 subtraction incorrect.  Able to copy intersecting pentagons  and draw a clock.  MMSE 29/30.  Speech fluent and not dysarthric, language intact.  CN II-XII intact.  Fundi not visualized.  Bulk and tone normal, muscle strength 5/5 throughout.  Sensation to light touch intact.  Deep tendon reflexes 2+ throughout.  Finger to nose and heel to shin testing intact.  Gait normal.  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  PLAN: 1.  Will continue Aricept 5mg  at bedtime for now.  When they return to [facility] in August, they will call us and we can increase it to 10mg  at bedtime. 2.  Set up routine schedule with set time for bed, waking up, meals, exercise.  Try not to nap during the day. 3.  Follow up in 6 months.  Metta Clines, DO  CC:  Phoebe Sharps, MD

## 2013-06-12 ENCOUNTER — Ambulatory Visit (INDEPENDENT_AMBULATORY_CARE_PROVIDER_SITE_OTHER): Payer: Medicare Other

## 2013-06-12 ENCOUNTER — Other Ambulatory Visit: Payer: Self-pay | Admitting: Cardiovascular Disease

## 2013-06-12 DIAGNOSIS — J309 Allergic rhinitis, unspecified: Secondary | ICD-10-CM | POA: Diagnosis not present

## 2013-06-16 NOTE — Assessment & Plan Note (Signed)
He feels it is helpful to continue allergy vaccine. Not needing antihistamines this spring.

## 2013-06-16 NOTE — Assessment & Plan Note (Signed)
Good compliance and control. Plan-take newer CPAP machine to be checked by home care

## 2013-06-20 ENCOUNTER — Ambulatory Visit (INDEPENDENT_AMBULATORY_CARE_PROVIDER_SITE_OTHER): Payer: Medicare Other

## 2013-06-20 DIAGNOSIS — J309 Allergic rhinitis, unspecified: Secondary | ICD-10-CM

## 2013-06-24 ENCOUNTER — Ambulatory Visit: Payer: Medicare Other | Admitting: Internal Medicine

## 2013-06-25 ENCOUNTER — Encounter: Payer: Self-pay | Admitting: Internal Medicine

## 2013-07-04 ENCOUNTER — Telehealth: Payer: Self-pay | Admitting: Internal Medicine

## 2013-07-04 ENCOUNTER — Ambulatory Visit (INDEPENDENT_AMBULATORY_CARE_PROVIDER_SITE_OTHER): Payer: Medicare Other

## 2013-07-04 DIAGNOSIS — J309 Allergic rhinitis, unspecified: Secondary | ICD-10-CM | POA: Diagnosis not present

## 2013-07-04 NOTE — Telephone Encounter (Signed)
Ok to let Corey [name] take his allergy vaccine with instructions for use while at [facility] for 2 months, for shots to be administered there.

## 2013-07-04 NOTE — Telephone Encounter (Signed)
Corey [name] is going to [place]. For two months and would like to take his vac.(wk. After next) To the [facility]. I gave him 0.4 of 1:10 today next wk.(He missed a wk. Or two twice.) He'll be at 0.5 1:10 next wk.. Dr.Joseph Hamm(is our contact person), Call center ph.# [phone],fax# [phone]. Please advise.

## 2013-07-09 ENCOUNTER — Other Ambulatory Visit: Payer: Self-pay | Admitting: Cardiovascular Disease

## 2013-07-09 ENCOUNTER — Other Ambulatory Visit: Payer: Self-pay | Admitting: Internal Medicine

## 2013-07-09 NOTE — Telephone Encounter (Signed)
I haven't heard from the [place] had contacted him. The [facility] needs an order from you Corey [name] stating it's okay to send his vac. And what's in his vac. [facility]  Of course I will send the rest of the necessary paperwork as well. Corey [name] has the clinic's address or I have their ph.# I can get it from them.  I'm guessing Corey [name] wants [place] to send the vac. So he doesn't have to worry about keeping up with the vac.,keeping it cold and in tact. Just let me know what you'd rather do,mail it(vac.) or send it(vac.) with Corey [name].

## 2013-07-09 NOTE — Telephone Encounter (Signed)
Ok to send Mr Corey [name]' allergy vaccine with instructions to the [facility] as they direct, for them to administer while he is there.

## 2013-07-09 NOTE — Telephone Encounter (Signed)
Sorry, to bother you, the [facility] just called. We will have to send Corey [name] vac. To them,they will not except if he brings it in.(vac.)

## 2013-07-09 NOTE — Telephone Encounter (Signed)
Thank you! I will call Corey [name] and find out when he's leaving and mail vac. To the clinic.

## 2013-07-09 NOTE — Telephone Encounter (Signed)
Med reordered.

## 2013-07-10 ENCOUNTER — Ambulatory Visit (INDEPENDENT_AMBULATORY_CARE_PROVIDER_SITE_OTHER): Payer: Medicare Other

## 2013-07-10 ENCOUNTER — Ambulatory Visit (INDEPENDENT_AMBULATORY_CARE_PROVIDER_SITE_OTHER): Payer: Medicare Other | Admitting: Internal Medicine

## 2013-07-10 ENCOUNTER — Encounter: Payer: Self-pay | Admitting: Internal Medicine

## 2013-07-10 VITALS — BP 112/74 | HR 76 | Temp 97.8°F | Ht 72.0 in | Wt 190.0 lb

## 2013-07-10 DIAGNOSIS — I2510 Atherosclerotic heart disease of native coronary artery without angina pectoris: Secondary | ICD-10-CM

## 2013-07-10 DIAGNOSIS — L570 Actinic keratosis: Secondary | ICD-10-CM | POA: Diagnosis not present

## 2013-07-10 DIAGNOSIS — R7309 Other abnormal glucose: Secondary | ICD-10-CM | POA: Diagnosis not present

## 2013-07-10 DIAGNOSIS — I10 Essential (primary) hypertension: Secondary | ICD-10-CM | POA: Diagnosis not present

## 2013-07-10 DIAGNOSIS — Z85828 Personal history of other malignant neoplasm of skin: Secondary | ICD-10-CM | POA: Diagnosis not present

## 2013-07-10 DIAGNOSIS — L820 Inflamed seborrheic keratosis: Secondary | ICD-10-CM | POA: Diagnosis not present

## 2013-07-10 DIAGNOSIS — J309 Allergic rhinitis, unspecified: Secondary | ICD-10-CM

## 2013-07-10 NOTE — Progress Notes (Signed)
Pre visit review using our clinic review tool, if applicable. No additional management support is needed unless otherwise documented below in the visit note. 

## 2013-07-10 NOTE — Progress Notes (Signed)
CHF-  Breast pain- spironolactone. He would like to try something else.   Sick Stomach- slight nausea until he drinks his afternoon whiskey and gingerale. Reviewed meds with him. He was taking aricept prior to nausea.   Mood- has ongoing depression. He forces himself to get up and go. On zoloft 50 mg  Bowel Issues- constipation. Using citrucel then miralax  Tremors- he notes with activity  He is taking a 2 [place] trip [place]  Reviewed [facility] psh meds  BP 112/74  Pulse 76  Temp(Src) 97.8 F (36.6 C) (Oral)  Ht 6' (1.829 m)  Wt 190 lb (86.183 kg)  BMI 25.76 kg/m2 Elderly male nad Chest cta cv-- reg rate abd- soft Ext- no edema   CORONARY ARTERY DISEASE No symptoms Risk factor modification  HYPERTENSION BP: 112/74 mmHg  Controlled Continue meds  HYPERGLYCEMIA Basic Metabolic Panel:    Component Value Date/Time   NA 138 01/02/2013 1322   K 4.9 01/02/2013 1322   CL 102 01/02/2013 1322   CO2 29 01/02/2013 1322   BUN 22 01/02/2013 1322   CREATININE 1.3 01/02/2013 1322   CREATININE 1.21 09/18/2012 0942   GLUCOSE 82 01/02/2013 1322   CALCIUM 9.0 01/02/2013 1322  discussed need for regular exercise and modest weight loss

## 2013-07-11 ENCOUNTER — Telehealth: Payer: Self-pay | Admitting: Internal Medicine

## 2013-07-11 NOTE — Telephone Encounter (Signed)
LMTCBx1, I do not see any neb meds on the pt med list. Concord Bing, CMA

## 2013-07-12 ENCOUNTER — Telehealth: Payer: Self-pay | Admitting: Internal Medicine

## 2013-07-12 NOTE — Telephone Encounter (Signed)
Albuterol neb is not on pt current med list and does not look like we have this on past medication list. Per pt he has an old RX from a long time ago. Please advise Dr. [name] if okay to refill albuterol neb medication? thanks

## 2013-07-12 NOTE — Telephone Encounter (Signed)
Please advise regarding albuterol neb? thanks

## 2013-07-12 NOTE — Telephone Encounter (Signed)
Ok Rs albuterol HFA (ventolin) inhaler  #1,   2 puffs every 6 hours if needed    Refill prn

## 2013-07-12 NOTE — Telephone Encounter (Signed)
Called spoke with pt. He reports he needed a refill on a neb medication. I advised him we did not have any neb meds on his list. He did not know the name of the medication he was using either. All he stated was it was a "liquid". NO neb meds on past medication list either that were giving as an RX. Pt reported to don't worry about it bc he doesn't;t use it any way. Will sign off message

## 2013-07-12 NOTE — Telephone Encounter (Signed)
Ok to refill albuterol HFA # 1, 2 puffs every 6 hours if needed   Refill prn

## 2013-07-12 NOTE — Telephone Encounter (Signed)
Ok to order albuterol 0.083% neb solution, #75 ml, 1 neb every 6 hours if needed, refill prn

## 2013-07-12 NOTE — Telephone Encounter (Signed)
Called pt home and was advised by son pt has left. Call pt cell at [phone]  [facility] x1

## 2013-07-13 NOTE — Assessment & Plan Note (Signed)
BP: 112/74 mmHg  Controlled Continue meds

## 2013-07-13 NOTE — Assessment & Plan Note (Signed)
Basic Metabolic Panel:    Component Value Date/Time   NA 138 01/02/2013 1322   K 4.9 01/02/2013 1322   CL 102 01/02/2013 1322   CO2 29 01/02/2013 1322   BUN 22 01/02/2013 1322   CREATININE 1.3 01/02/2013 1322   CREATININE 1.21 09/18/2012 0942   GLUCOSE 82 01/02/2013 1322   CALCIUM 9.0 01/02/2013 1322  discussed need for regular exercise and modest weight loss

## 2013-07-13 NOTE — Assessment & Plan Note (Signed)
No symptoms Risk factor modification

## 2013-07-15 NOTE — Telephone Encounter (Signed)
Called pt cell and lmtcb x2

## 2013-07-16 DIAGNOSIS — I10 Essential (primary) hypertension: Secondary | ICD-10-CM | POA: Diagnosis not present

## 2013-07-16 DIAGNOSIS — Z950 Presence of cardiac pacemaker: Secondary | ICD-10-CM | POA: Diagnosis not present

## 2013-07-16 DIAGNOSIS — J301 Allergic rhinitis due to pollen: Secondary | ICD-10-CM | POA: Diagnosis not present

## 2013-07-16 DIAGNOSIS — I4891 Unspecified atrial fibrillation: Secondary | ICD-10-CM | POA: Diagnosis not present

## 2013-07-16 NOTE — Telephone Encounter (Signed)
I was not able to send his vac. Fri.(crazy busy) but I did get it in today's mail. The [facility] should get the vac. within 2 or 3 days. I called Corey [name] and left a message on his cell ph. concerning this.

## 2013-07-16 NOTE — Telephone Encounter (Signed)
Spoke with pt and he states that he does not need meds (Albuterol inh or nebs) sent now because he is out of town at [facility] clinic.  Pt states that allergy vaccine was supposed to be mailed to [facility] but as of today they said they have not received anything.  Tammy Scott please f/u with pt on this.  He can be reached at [phone] or cell # [phone]

## 2013-07-16 NOTE — Telephone Encounter (Signed)
[place] on cell and home number

## 2013-07-24 ENCOUNTER — Telehealth: Payer: Self-pay | Admitting: Internal Medicine

## 2013-07-24 DIAGNOSIS — I10 Essential (primary) hypertension: Secondary | ICD-10-CM | POA: Diagnosis not present

## 2013-07-24 DIAGNOSIS — I4891 Unspecified atrial fibrillation: Secondary | ICD-10-CM | POA: Diagnosis not present

## 2013-07-24 DIAGNOSIS — Z5181 Encounter for therapeutic drug level monitoring: Secondary | ICD-10-CM | POA: Diagnosis not present

## 2013-07-24 NOTE — Telephone Encounter (Signed)
Mr. Arakawa wants [place] to make more vac. And send it 2nd day air via [place]. He of course will pay for it. It's been 8 days. He's going to wait til the mail runs tomorrow and call us to let us know if it came in. Caryl Pina will be in the allergy lab to help give shots tomorrow morning. I let him know we are closed on Friday.

## 2013-07-25 ENCOUNTER — Telehealth: Payer: Self-pay | Admitting: *Deleted

## 2013-07-25 DIAGNOSIS — I509 Heart failure, unspecified: Secondary | ICD-10-CM

## 2013-07-25 NOTE — Telephone Encounter (Signed)
Have we heard if his vaccine arrived today, or are we going to have to make him a new set to send by UPS??

## 2013-07-25 NOTE — Telephone Encounter (Signed)
Dr Rayann Heman received a staff message from Dr Leanne Chang pertaining to patient and him having breast tenderness with Aldactone.  Dr Rayann Heman recommended stop Aldactone and repeat BMP in 4 weeks.  I have left a message for patient with this information

## 2013-07-25 NOTE — Telephone Encounter (Signed)
He wants [place] to send it via [place]. I just spoke with Mr.Fenley a few minutes ago. I will ask Myrth Dahan D. About this & let you know.

## 2013-07-25 NOTE — Telephone Encounter (Signed)
I asked T.Rosana Hoes about sending it UPS and she said we could do that if you're ok with writing the 1st vac. Off. Please advise.

## 2013-07-29 NOTE — Telephone Encounter (Signed)
I called Mr.Madan Fri. And made him aware that you want him to wait at least til Tues.(07/30/13) to see if the package comes in. Then you would revisit remaking and sending vac. By UPS. Pt. Called back Fri. Morning and left a message saying: That was fine we'll just wait til next wk.Marland Kitchen FYI Pt. Went by [facility]. I'm guessing he's talking about [place] and his CPAP machine.(he didn't say.) He spoke with Jonni Sanger who is pushing for him to use his new machine. He's going to stick with his old machine.  Mr.Jasperson told him he wasn't going to do that now he'd wait til he gets back home. I'm assuming [facility]( when he says"I'll do an overnight and then they can set my machine where it's needs to be." Mr. Gangemi didn't specify.

## 2013-07-29 NOTE — Telephone Encounter (Deleted)
Melissa w/ [facility] is asking to speak w/ CY or his nurse regarding pt's pap.  Melissa can be reached at [phone].  Satira Anis

## 2013-07-30 ENCOUNTER — Telehealth: Payer: Self-pay | Admitting: Internal Medicine

## 2013-07-30 NOTE — Telephone Encounter (Signed)
Ok

## 2013-07-30 NOTE — Telephone Encounter (Signed)
Spoke with Melissa from [facility] and she stated that they were not able to get a download from the pts cpap since he took the S8 with him (this is the one that was not working properly)  Instead of the S9.  Melissa wanted to call and make CY aware.   Will forward to CY to make him aware.

## 2013-07-30 NOTE — Telephone Encounter (Signed)
He can do without vaccine till he gets back here.   He paid for the bottles at [facility], so it would be worth it to him to to pick it up from them and bring them back as he travels and returns here.

## 2013-07-30 NOTE — Telephone Encounter (Signed)
Corey [name] called today they DID receive his vac. At [facility] yesterday or today and he got his shot. After 3 wks. In [place]. He will be going to 2 or 3 different states for the next three wks. He wanted to know if he could take his shots with him. I told him if he was willing to find dr.'s offices where he would be he could. He stooped me and said I'll just wait till I get home. In other words the [facility] needs to send the vac. Back to Corey [name]? Please advise.

## 2013-08-01 NOTE — Telephone Encounter (Signed)
Ok

## 2013-08-01 NOTE — Telephone Encounter (Signed)
Called pt. This morning,he's concerned about keeping up with the vac. And keeping it cool while he's traveling. If [facility] will allow him to ship it via [place] he had rather do that. He's going to let me know what he finds out.

## 2013-08-06 ENCOUNTER — Other Ambulatory Visit: Payer: Self-pay | Admitting: Neurology

## 2013-08-06 ENCOUNTER — Other Ambulatory Visit: Payer: Self-pay | Admitting: Internal Medicine

## 2013-08-08 DIAGNOSIS — J301 Allergic rhinitis due to pollen: Secondary | ICD-10-CM | POA: Diagnosis not present

## 2013-08-08 DIAGNOSIS — Z516 Encounter for desensitization to allergens: Secondary | ICD-10-CM | POA: Diagnosis not present

## 2013-08-16 DIAGNOSIS — Z516 Encounter for desensitization to allergens: Secondary | ICD-10-CM | POA: Diagnosis not present

## 2013-08-21 DIAGNOSIS — J309 Allergic rhinitis, unspecified: Secondary | ICD-10-CM | POA: Diagnosis not present

## 2013-08-21 DIAGNOSIS — Z516 Encounter for desensitization to allergens: Secondary | ICD-10-CM | POA: Diagnosis not present

## 2013-09-10 ENCOUNTER — Telehealth: Payer: Self-pay | Admitting: Cardiology

## 2013-09-10 ENCOUNTER — Encounter: Payer: Medicare Other | Admitting: *Deleted

## 2013-09-10 NOTE — Telephone Encounter (Signed)
LMOVM reminding pt to send remote transmission.   

## 2013-09-11 ENCOUNTER — Encounter: Payer: Self-pay | Admitting: Cardiology

## 2013-09-16 ENCOUNTER — Telehealth: Payer: Self-pay | Admitting: Internal Medicine

## 2013-09-16 NOTE — Telephone Encounter (Signed)
New problem   Pt is feeling very tired and breaking out in sweat. Pt would like to speak to nurse.

## 2013-09-16 NOTE — Telephone Encounter (Signed)
I called and spoke with the patient. He states that he has having weakness for the last few months, which has been getting worse over the last few days. He is sweating a lot with very little activity. He states that his balance is "horrible." He has had 2 falls over the last few weeks. One fall was due to a slip and the other was due to his balance. He has a little dizziness with activity. He is not checking his BP at home. He states that he feels like his HR is under control, but if not for his device, he feels like it would be racing. He states that he has intermittent skipped beats. The patient was due for a remote transmission last week, but he states he was out of town at the time. I have advised the patient to please send a manuel transmission. He reports that he will not be home until around 6:00pm. I have instructed him how to send a manuel transmission. He will call in the morning if he needs help with this. I explained to the patient that his symptoms could be related to a TIA or neurologic event, dehydration, or his heart. Symptoms have been going for a few months now. He will transmit first and then we will follow up with him after that. I have also advised him to check his BP.He is agreeable.

## 2013-09-17 NOTE — Telephone Encounter (Signed)
Remote not received last night. Gave pt instructions to send manual remote today; transmission received.  No alerts or ventricular episodes. 4 mode switches---EGMs show duration of 4sec each. All other diagnostics normal. I updated pt I do not believe his symptoms are related to device function. Pt thankful and will pursue assistance from other non-cardiac specialists.

## 2013-09-19 ENCOUNTER — Telehealth: Payer: Self-pay | Admitting: *Deleted

## 2013-09-19 ENCOUNTER — Ambulatory Visit (INDEPENDENT_AMBULATORY_CARE_PROVIDER_SITE_OTHER): Payer: Medicare Other

## 2013-09-19 DIAGNOSIS — J309 Allergic rhinitis, unspecified: Secondary | ICD-10-CM

## 2013-09-19 NOTE — Telephone Encounter (Signed)
I left a message for the patient to call and see how his symptoms are and let him know about his fluid trends.

## 2013-09-19 NOTE — Telephone Encounter (Signed)
Patient states that he has been having really wired dreams since he started his new medication he want th know if e can try something something  Call [phone]

## 2013-09-19 NOTE — Telephone Encounter (Signed)
Instead, he can try the Exelon patch, 4.6mg  to be switched every 24 hours.  He should call in 4 weeks and if tolerating, we can increase the dose.  Alternatively, if he wishes, he can take the pill form of Exelon, 1.5mg  twice daily for 4 weeks, then he can call us and we can increase the dose further if tolerated.

## 2013-09-19 NOTE — Telephone Encounter (Signed)
Patient states he is having really vivid dreams he stopped the aricept  Last night and was unable to sleep . Please advise

## 2013-09-19 NOTE — Telephone Encounter (Signed)
Pt wants to talk to someone [phone]

## 2013-09-20 ENCOUNTER — Other Ambulatory Visit: Payer: Self-pay | Admitting: *Deleted

## 2013-09-20 MED ORDER — RIVASTIGMINE TARTRATE 1.5 MG PO CAPS: 1.5000 mg | ORAL_CAPSULE | Freq: Two times a day (BID) | ORAL | Status: DC

## 2013-09-20 NOTE — Telephone Encounter (Signed)
Patient will take the exelon 1.5 mg bid

## 2013-09-23 ENCOUNTER — Other Ambulatory Visit: Payer: Self-pay | Admitting: *Deleted

## 2013-09-23 MED ORDER — PANTOPRAZOLE SODIUM 40 MG PO TBEC: DELAYED_RELEASE_TABLET | ORAL | Status: DC

## 2013-09-25 ENCOUNTER — Encounter: Payer: Self-pay | Admitting: Internal Medicine

## 2013-09-25 ENCOUNTER — Encounter: Payer: Self-pay | Admitting: *Deleted

## 2013-09-26 ENCOUNTER — Ambulatory Visit (INDEPENDENT_AMBULATORY_CARE_PROVIDER_SITE_OTHER): Payer: Medicare Other

## 2013-09-26 DIAGNOSIS — J309 Allergic rhinitis, unspecified: Secondary | ICD-10-CM

## 2013-09-27 ENCOUNTER — Other Ambulatory Visit: Payer: Self-pay | Admitting: Internal Medicine

## 2013-10-02 ENCOUNTER — Telehealth: Payer: Self-pay | Admitting: Internal Medicine

## 2013-10-02 ENCOUNTER — Ambulatory Visit (INDEPENDENT_AMBULATORY_CARE_PROVIDER_SITE_OTHER): Payer: Medicare Other | Admitting: *Deleted

## 2013-10-02 DIAGNOSIS — Z5181 Encounter for therapeutic drug level monitoring: Secondary | ICD-10-CM | POA: Diagnosis not present

## 2013-10-02 DIAGNOSIS — Z7901 Long term (current) use of anticoagulants: Secondary | ICD-10-CM

## 2013-10-02 DIAGNOSIS — I4891 Unspecified atrial fibrillation: Secondary | ICD-10-CM

## 2013-10-02 LAB — POCT INR: INR: 2.7

## 2013-10-02 MED ORDER — WARFARIN SODIUM 2.5 MG PO TABS: ORAL_TABLET | ORAL | Status: DC

## 2013-10-02 NOTE — Telephone Encounter (Signed)
Pt coming in for coumadin 9-23 and wants tot "talk" to dr allred that day, he wanted to make an appt, he wouldn't say why he needed to be seen other than to check his ''overall' health, check in told him we would have nurse call him back to determine if a soon appt needed

## 2013-10-02 NOTE — Telephone Encounter (Signed)
Spoke with patient and advised him I would schedule an appointment with Dr Rayann Heman.  He is c/o "lots of problems"  Doesn't have energy to get out of chair.  Says he has aged 78 years in the last 3 years  I will have [place] call and schedule an appointment

## 2013-10-03 ENCOUNTER — Ambulatory Visit (INDEPENDENT_AMBULATORY_CARE_PROVIDER_SITE_OTHER): Payer: Medicare Other

## 2013-10-03 DIAGNOSIS — J309 Allergic rhinitis, unspecified: Secondary | ICD-10-CM

## 2013-10-05 ENCOUNTER — Encounter (HOSPITAL_COMMUNITY): Payer: Self-pay | Admitting: Emergency Medicine

## 2013-10-05 ENCOUNTER — Emergency Department (HOSPITAL_COMMUNITY)
Admission: EM | Admit: 2013-10-05 | Discharge: 2013-10-05 | Disposition: A | Discharge status: Home / Self Care | Payer: Medicare Other | Attending: Emergency Medicine | Admitting: Emergency Medicine

## 2013-10-05 DIAGNOSIS — I10 Essential (primary) hypertension: Secondary | ICD-10-CM | POA: Insufficient documentation

## 2013-10-05 DIAGNOSIS — I252 Old myocardial infarction: Secondary | ICD-10-CM | POA: Diagnosis not present

## 2013-10-05 DIAGNOSIS — Z7982 Long term (current) use of aspirin: Secondary | ICD-10-CM | POA: Insufficient documentation

## 2013-10-05 DIAGNOSIS — Z950 Presence of cardiac pacemaker: Secondary | ICD-10-CM | POA: Insufficient documentation

## 2013-10-05 DIAGNOSIS — I5022 Chronic systolic (congestive) heart failure: Secondary | ICD-10-CM | POA: Insufficient documentation

## 2013-10-05 DIAGNOSIS — Z8673 Personal history of transient ischemic attack (TIA), and cerebral infarction without residual deficits: Secondary | ICD-10-CM | POA: Insufficient documentation

## 2013-10-05 DIAGNOSIS — I2510 Atherosclerotic heart disease of native coronary artery without angina pectoris: Secondary | ICD-10-CM | POA: Insufficient documentation

## 2013-10-05 DIAGNOSIS — Z8701 Personal history of pneumonia (recurrent): Secondary | ICD-10-CM | POA: Insufficient documentation

## 2013-10-05 DIAGNOSIS — J45909 Unspecified asthma, uncomplicated: Secondary | ICD-10-CM | POA: Insufficient documentation

## 2013-10-05 DIAGNOSIS — Z860100 Personal history of colon polyps, unspecified: Secondary | ICD-10-CM | POA: Diagnosis not present

## 2013-10-05 DIAGNOSIS — IMO0002 Reserved for concepts with insufficient information to code with codable children: Secondary | ICD-10-CM | POA: Diagnosis not present

## 2013-10-05 DIAGNOSIS — I4891 Unspecified atrial fibrillation: Secondary | ICD-10-CM | POA: Diagnosis not present

## 2013-10-05 DIAGNOSIS — M79662 Pain in left lower leg: Secondary | ICD-10-CM

## 2013-10-05 DIAGNOSIS — K219 Gastro-esophageal reflux disease without esophagitis: Secondary | ICD-10-CM | POA: Insufficient documentation

## 2013-10-05 DIAGNOSIS — M79609 Pain in unspecified limb: Secondary | ICD-10-CM | POA: Insufficient documentation

## 2013-10-05 DIAGNOSIS — Z8546 Personal history of malignant neoplasm of prostate: Secondary | ICD-10-CM | POA: Insufficient documentation

## 2013-10-05 DIAGNOSIS — Z79899 Other long term (current) drug therapy: Secondary | ICD-10-CM | POA: Diagnosis not present

## 2013-10-05 DIAGNOSIS — Z8659 Personal history of other mental and behavioral disorders: Secondary | ICD-10-CM | POA: Insufficient documentation

## 2013-10-05 DIAGNOSIS — Z7901 Long term (current) use of anticoagulants: Secondary | ICD-10-CM | POA: Insufficient documentation

## 2013-10-05 DIAGNOSIS — M129 Arthropathy, unspecified: Secondary | ICD-10-CM | POA: Diagnosis not present

## 2013-10-05 LAB — BASIC METABOLIC PANEL
Anion gap: 10 (ref 5–15)
BUN: 16 mg/dL (ref 6–23)
CALCIUM: 9.1 mg/dL (ref 8.4–10.5)
CO2: 27 mEq/L (ref 19–32)
Chloride: 103 mEq/L (ref 96–112)
Creatinine, Ser: 1.26 mg/dL (ref 0.50–1.35)
GFR calc Af Amer: 63 mL/min — ABNORMAL LOW (ref >90)
GFR calc non Af Amer: 54 mL/min — ABNORMAL LOW (ref >90)
GLUCOSE: 94 mg/dL (ref 70–99)
Potassium: 5.1 mEq/L (ref 3.7–5.3)
Sodium: 140 mEq/L (ref 137–147)

## 2013-10-05 LAB — PROTIME-INR
INR: 2.8 — ABNORMAL HIGH (ref 0.00–1.49)
PROTHROMBIN TIME: 29.5 s — AB (ref 11.6–15.2)

## 2013-10-05 LAB — CBC
HEMATOCRIT: 36 % — AB (ref 39.0–52.0)
HEMOGLOBIN: 12.1 g/dL — AB (ref 13.0–17.0)
MCH: 29.4 pg (ref 26.0–34.0)
MCHC: 33.6 g/dL (ref 30.0–36.0)
MCV: 87.4 fL (ref 78.0–100.0)
Platelets: 213 10*3/uL (ref 150–400)
RBC: 4.12 MIL/uL — ABNORMAL LOW (ref 4.22–5.81)
RDW: 14.4 % (ref 11.5–15.5)
WBC: 5.2 10*3/uL (ref 4.0–10.5)

## 2013-10-05 NOTE — ED Notes (Addendum)
Pt c/o left leg pain at 0500. Pt tried walking without relief. Pt currently takes Coumadin, but does not know why. Pt denies swelling or injury.

## 2013-10-05 NOTE — ED Notes (Signed)
Patient declines wheelchair at discharge.  Patient escorted to lobby by RN.

## 2013-10-05 NOTE — Discharge Instructions (Signed)
IMPORTANT PATIENT INSTRUCTIONS:  You have been scheduled for an Outpatient Vascular Study at [facility].    If tomorrow is a Saturday or Sunday, please go to the [facility] Emergency Department Registration Desk at 8:30 am tomorrow morning and tell them you are there for a vascular study.  If tomorrow is a weekday (Monday-Friday), please go to [place] Department at 8:30 am and tell them you

## 2013-10-06 ENCOUNTER — Ambulatory Visit (HOSPITAL_COMMUNITY)
Admission: RE | Admit: 2013-10-06 | Discharge: 2013-10-06 | Disposition: A | Discharge status: Home / Self Care | Payer: Medicare Other | Source: Ambulatory Visit | Attending: Emergency Medicine | Admitting: Emergency Medicine

## 2013-10-06 ENCOUNTER — Ambulatory Visit (HOSPITAL_COMMUNITY): Payer: Medicare Other | Attending: Emergency Medicine

## 2013-10-06 DIAGNOSIS — M79609 Pain in unspecified limb: Secondary | ICD-10-CM | POA: Diagnosis not present

## 2013-10-06 NOTE — Progress Notes (Signed)
VASCULAR LAB PRELIMINARY  PRELIMINARY  PRELIMINARY  PRELIMINARY  Left lower extremity venous Doppler completed.    Preliminary report:  There is no DVT or SVT noted in the left lower extremity.  Incidentally, there is mild plaque noted throughout the left lower extremity with adequate blood flow and no areas of stenosis.    Madaleine Simmon, RVT 10/06/2013, 9:24 AM

## 2013-10-06 NOTE — ED Provider Notes (Signed)
CSN: [id number]     Arrival date & time 10/05/13  1712 History   First MD Initiated Contact with Patient 10/05/13 1932     Chief Complaint  Patient presents with  . Leg Pain     (Consider location/radiation/quality/duration/timing/severity/associated sxs/prior Treatment) Patient is a 78 y.o. male presenting with leg pain. The history is provided by the patient.  Leg Pain Associated symptoms: no fatigue    patient presents with pain in his left lower leg. It is on the outside leg. No trauma. It is worse with certain positions. No numbness or weakness. No swelling no leg. The pain, goes. He does not make it come on. He is not had pains like this before. He has a history of atrial fibrillation and is on anticoagulation. No fevers. No shortness of breath.  Past Medical History  Diagnosis Date  . Alcohol abuse, episodic 05/19/2008  . ALLERGIC RHINITIS 04/15/2009  . COLONIC POLYPS, ADENOMATOUS, HX OF 02/14/2008  . DIVERTICULOSIS, COLON 02/14/2008  . ESOPHAGEAL STRICTURE 01/28/2008  . GERD 02/14/2008  . HYPERGLYCEMIA 11/16/2006  . HYPERLIPIDEMIA 02/14/2008  . INSOMNIA-SLEEP DISORDER-UNSPEC 02/11/2009  . 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 PROSTATE CANCER, HX OF 02/25/2000  . TRANSIENT ISCHEMIC ATTACKS, HX OF 11/16/2006  . Arthritis   . Atrial fibrillation 11/16/2006    remote CHF related to atrial fib with rapid ventricular response over 25 yrs per office note,  . ASTHMA 11/16/2006    sinusitis-    hx ?yeast patch/white patch on vocal cord as per [facility] 02/25/11-   . OBSTRUCTIVE SLEEP APNEA 11/10/2008  . SLEEP APNEA 10/03/2009    LOV Dr [name] 12/12 in EPIC    Moderate per patient- settings ?6/last sleep study years ago  . HYPERTENSION 11/16/2006  . Symptomatic bradycardia, secondary to sinus node dysfunction 07/08/2011    s/p [facility] Scientific PPM by Dr Sallyanne Kuster, upgraded to [place] ICD 12/13 by Dr Rayann Heman (SJM)  . Myocardial infarction 1993  . Pacemaker     St. Jude  . ICD (implantable cardiac  defibrillator) in place   . Anxiety   . Depression   . Pneumonia     hx of  . Headache(784.0)   . Cancer 2002    prostate cancer  . CORONARY ARTERY DISEASE 11/16/2006    LHC (07/2011): LAD with luminal irregularities, proximal circumflex 50%, EF 25%  . Chronic systolic CHF (congestive heart failure)     echo (10/02/12): EF 54-62%, grade 1 diastolic dysfunction, trivial AI, mild MR, mild RVE  . NICM (nonischemic cardiomyopathy)   . Stroke     Tia  . Lesion of vocal cord     bx begine   Past Surgical History  Procedure Laterality Date  . Tonsilectomy, adenoidectomy, bilateral myringotomy and tubes    . Knee arthroscopy      2 surgeries right and 1 left  . Prostate surgery  2/02    prostate cancer  . Uvulopalatopharyngoplasty  2013  . Esophagogastroduodenoscopy      with dilitation  . Lumbar laminectomy/decompression microdiscectomy  03/02/2011    Procedure: LUMBAR LAMINECTOMY/DECOMPRESSION MICRODISCECTOMY;  Surgeon: [name], MD;  Location: [facility];  Service: Orthopedics;  Laterality: N/A;  Decompression Lumbar 4 - Lumbar(X-Ray)  . Pacemaker insertion  06/2011    [employer] PPM implanted by Dr [employer]  . Cardiac defibrillator placement  01/16/12    Upgrade to a biventricular SJM ICD by DR Allred  . Tonsillectomy  as child  . Cardiac catheterization  several  . Back surgery    . Insert / replace / remove pacemaker    . Total knee arthroplasty Left 06/04/2012    Procedure: TOTAL KNEE ARTHROPLASTY;  Surgeon: [name], MD;  Location: [facility];  Service: Orthopedics;  Laterality: Left;  . Colonoscopy    . Vocal cord lesion bx     Family History  Problem Relation Age of Onset  . Heart attack Father   . Heart attack Brother   . Heart disease Maternal Uncle   . Heart attack Maternal Uncle   . Colon cancer Neg Hx   . Esophageal cancer Neg Hx   . Rectal cancer Neg Hx   . Prostate cancer Neg Hx   . Stomach cancer Neg Hx    History  Substance Use Topics  .  Smoking status: Former Smoker -- 1.00 packs/day for 4 years    Types: Cigarettes    Quit date: 03/22/1964  . Smokeless tobacco: Never Used     Comment: reports smoked socially  . Alcohol Use: 0.0 oz/week     Comment: 2 mixed drinks daily, some days    Review of Systems  Constitutional: Negative for fatigue.  Respiratory: Negative for shortness of breath.   Cardiovascular: Negative for chest pain.  Skin: Negative for rash and wound.  Hematological: Negative for adenopathy. Bruises/bleeds easily.      Allergies  Hydrocodone and Oxycodone  Home Medications   Prior to Admission medications   Medication Sig Start Date End Date Taking? Authorizing Provider  acetaminophen (TYLENOL) 500 MG tablet Take 500 mg by mouth every 6 (six) hours as needed for pain. Per bottle as needed for pain    Historical Provider, MD  albuterol (PROVENTIL HFA;VENTOLIN HFA) 108 (90 BASE) MCG/ACT inhaler Inhale 2 puffs into the lungs every 6 (six) hours as needed for shortness of breath.     Historical Provider, MD  amiodarone (PACERONE) 200 MG tablet Take 200 mg by mouth daily.  08/11/11   Historical Provider, MD  aspirin EC 81 MG tablet Take 81 mg by mouth every evening.     Historical Provider, MD  CRESTOR 5 MG tablet TAKE ONE TABLET IN THE EVENING 10/01/13   Thompson Grayer, MD  donepezil (ARICEPT) 5 MG tablet TAKE ONE TABLET AT BEDTIME 08/06/13   Dudley Major, DO  metoprolol succinate (TOPROL-XL) 50 MG 24 hr tablet Take 1 tablet (50mg ) in morning and take 1/2 tablet (25mg ) in evening. 11/14/12   Lorretta Harp, MD  mometasone (NASONEX) 50 MCG/ACT nasal spray TWO SPRAYS IN EACH NOSTRIL AS NEEDED 01/23/13   Deneise Lever, MD  pantoprazole (PROTONIX) 40 MG tablet TAKE ONE TABLET BY MOUTH ONCE DAILY 09/23/13   Lafayette Dragon, MD  polyethylene glycol powder (GLYCOLAX/MIRALAX) powder Take 17 g by mouth daily. 02/05/13   Lafayette Dragon, MD  ramipril (ALTACE) 10 MG capsule TAKE TWO CAPSULES EVERY DAY 07/09/13   Mihai  Croitoru, MD  rivastigmine (EXELON) 1.5 MG capsule Take 1 capsule (1.5 mg total) by mouth 2 (two) times daily. 09/20/13   Dudley Major, DO  spironolactone (ALDACTONE) 25 MG tablet TAKE ONE TABLET BY MOUTH ONCE DAILY    Thompson Grayer, MD  warfarin (COUMADIN) 2.5 MG tablet Take as directed by coumadin clinic 10/02/13   Thompson Grayer, MD  zolpidem (AMBIEN) 5 MG tablet TAKE ONE TABLET AT BEDTIME AS NEEDED 07/09/13   Lisabeth Pick, MD   BP 142/85  Pulse 63  Temp(Src) 97.6 F (  36.4 C) (Axillary)  Resp 13  Ht 6\' 1"  (1.854 m)  Wt 182 lb (82.555 kg)  BMI 24.02 kg/m2  SpO2 98% Physical Exam  Constitutional: He appears well-nourished.  HENT:  Head: Normocephalic.  Eyes: Pupils are equal, round, and reactive to light.  Cardiovascular: Normal rate and regular rhythm.   Pulmonary/Chest: Effort normal and breath sounds normal.  Abdominal: Soft.  Musculoskeletal: Normal range of motion. He exhibits tenderness.  Slight tenderness to left lower leg laterally just proximal to distal fibula. No deformity. No erythema. Dorsalis pedis pulse intact. No peripheral edema. No rash.  Neurological: He is alert.  Skin: Skin is warm. No rash noted. No erythema.    ED Course  Procedures (including critical care time) Labs Review Labs Reviewed  CBC - Abnormal; Notable for the following:    RBC 4.12 (*)    Hemoglobin 12.1 (*)    HCT 36.0 (*)    All other components within normal limits  BASIC METABOLIC PANEL - Abnormal; Notable for the following:    GFR calc non Af Amer 54 (*)    GFR calc Af Amer 63 (*)    All other components within normal limits  PROTIME-INR - Abnormal; Notable for the following:    Prothrombin Time 29.5 (*)    INR 2.80 (*)    All other components within normal limits    Imaging Review No results found.   EKG Interpretation None      MDM   Final diagnoses:  Pain of left lower leg    Patient with pain in his leg. History of atrial fibrillation. Strong pulses.  Therapeutic on Coumadin. Will get outpatient Dopplers. I think low risk for DVT or arterial embolic disease.    Jasper Riling. Alvino Chapel, MD 10/06/13 [phone]

## 2013-10-07 ENCOUNTER — Encounter: Payer: Self-pay | Admitting: *Deleted

## 2013-10-07 ENCOUNTER — Encounter: Payer: Self-pay | Admitting: Internal Medicine

## 2013-10-07 ENCOUNTER — Other Ambulatory Visit: Payer: Self-pay | Admitting: Internal Medicine

## 2013-10-07 ENCOUNTER — Ambulatory Visit (INDEPENDENT_AMBULATORY_CARE_PROVIDER_SITE_OTHER): Payer: Medicare Other | Admitting: Internal Medicine

## 2013-10-07 VITALS — BP 124/80 | HR 65 | Ht 73.0 in | Wt 191.0 lb

## 2013-10-07 DIAGNOSIS — I4891 Unspecified atrial fibrillation: Secondary | ICD-10-CM

## 2013-10-07 DIAGNOSIS — I498 Other specified cardiac arrhythmias: Secondary | ICD-10-CM | POA: Diagnosis not present

## 2013-10-07 DIAGNOSIS — G4700 Insomnia, unspecified: Secondary | ICD-10-CM

## 2013-10-07 DIAGNOSIS — I5022 Chronic systolic (congestive) heart failure: Secondary | ICD-10-CM

## 2013-10-07 DIAGNOSIS — I428 Other cardiomyopathies: Secondary | ICD-10-CM | POA: Diagnosis not present

## 2013-10-07 DIAGNOSIS — I10 Essential (primary) hypertension: Secondary | ICD-10-CM

## 2013-10-07 DIAGNOSIS — I495 Sick sinus syndrome: Secondary | ICD-10-CM

## 2013-10-07 DIAGNOSIS — I480 Paroxysmal atrial fibrillation: Secondary | ICD-10-CM

## 2013-10-07 DIAGNOSIS — R5381 Other malaise: Secondary | ICD-10-CM

## 2013-10-07 DIAGNOSIS — G4733 Obstructive sleep apnea (adult) (pediatric): Secondary | ICD-10-CM

## 2013-10-07 DIAGNOSIS — R001 Bradycardia, unspecified: Secondary | ICD-10-CM

## 2013-10-07 DIAGNOSIS — F1010 Alcohol abuse, uncomplicated: Secondary | ICD-10-CM

## 2013-10-07 DIAGNOSIS — I2510 Atherosclerotic heart disease of native coronary artery without angina pectoris: Secondary | ICD-10-CM

## 2013-10-07 DIAGNOSIS — R5383 Other fatigue: Secondary | ICD-10-CM

## 2013-10-07 LAB — MDC_IDC_ENUM_SESS_TYPE_INCLINIC
Brady Statistic RA Percent Paced: 99.72 %
Brady Statistic RV Percent Paced: 99.51 %
HighPow Impedance: 73.125
Lead Channel Impedance Value: 1175 Ohm
Lead Channel Impedance Value: 575 Ohm
Lead Channel Pacing Threshold Amplitude: 1 V
Lead Channel Pacing Threshold Amplitude: 1 V
Lead Channel Pacing Threshold Amplitude: 1 V
Lead Channel Pacing Threshold Amplitude: 1.25 V
Lead Channel Pacing Threshold Pulse Width: 0.5 ms
Lead Channel Pacing Threshold Pulse Width: 0.5 ms
Lead Channel Pacing Threshold Pulse Width: 0.5 ms
Lead Channel Pacing Threshold Pulse Width: 0.5 ms
Lead Channel Pacing Threshold Pulse Width: 0.5 ms
Lead Channel Sensing Intrinsic Amplitude: 10.8 mV
Lead Channel Sensing Intrinsic Amplitude: 2.2 mV
Lead Channel Setting Pacing Amplitude: 2 V
Lead Channel Setting Pacing Amplitude: 2 V
Lead Channel Setting Pacing Amplitude: 2.5 V
Lead Channel Setting Pacing Pulse Width: 0.5 ms
Lead Channel Setting Pacing Pulse Width: 0.5 ms
Lead Channel Setting Sensing Sensitivity: 0.5 mV
MDC IDC MSMT BATTERY REMAINING LONGEVITY: 60 mo
MDC IDC MSMT LEADCHNL LV PACING THRESHOLD AMPLITUDE: 1 V
MDC IDC MSMT LEADCHNL LV PACING THRESHOLD PULSEWIDTH: 0.5 ms
MDC IDC MSMT LEADCHNL RV IMPEDANCE VALUE: 425 Ohm
MDC IDC MSMT LEADCHNL RV PACING THRESHOLD AMPLITUDE: 1.25 V
MDC IDC PG MODEL: 3265
MDC IDC PG SERIAL: 7057550
MDC IDC SESS DTM: 20150914160532
Zone Setting Detection Interval: 270 ms
Zone Setting Detection Interval: 340 ms

## 2013-10-07 LAB — HEPATIC FUNCTION PANEL
ALK PHOS: 69 U/L (ref 39–117)
ALT: 37 U/L (ref 0–53)
AST: 49 U/L — ABNORMAL HIGH (ref 0–37)
Albumin: 3.7 g/dL (ref 3.5–5.2)
BILIRUBIN DIRECT: 0.1 mg/dL (ref 0.0–0.3)
BILIRUBIN TOTAL: 0.5 mg/dL (ref 0.2–1.2)
Total Protein: 7.1 g/dL (ref 6.0–8.3)

## 2013-10-07 LAB — TSH: TSH: 0.95 u[IU]/mL (ref 0.35–4.50)

## 2013-10-07 MED ORDER — METOPROLOL SUCCINATE ER 50 MG PO TB24: ORAL_TABLET | ORAL | Status: DC

## 2013-10-07 MED ORDER — AMIODARONE HCL 200 MG PO TABS: 100.0000 mg | ORAL_TABLET | Freq: Every day | ORAL | Status: DC

## 2013-10-07 NOTE — Progress Notes (Signed)
PCP: previously Dr Leanne Chang Primary Cardiologist:   Previously Dr Camelia Eng DOYLE Corey [name] is a 78 y.o. male who presents today for cardiology/ electrophysiology followup. His wife feels that he is depressed.  He admits to very little enjoyment in life.  Unfortunately, he continues to drink a significant amount of alcohol at night and does not seem to have intention to quit.  He says that this is the only thing that he enjoys.   He does not feel well rested upon waking and is tired throughout the day.  He has sleep apnea and is compliant with CPAP.  He has fatigue and weakness at times.  He has rare dizziness.  Today, he denies symptoms of chest pain, shortness of breath, lower extremity edema, dizziness, presyncope, syncope, or ICD shocks.  The patient is otherwise without complaint today.   Past Medical History  Diagnosis Date  . Alcohol abuse, episodic 05/19/2008  . ALLERGIC RHINITIS 04/15/2009  . COLONIC POLYPS, ADENOMATOUS, HX OF 02/14/2008  . DIVERTICULOSIS, COLON 02/14/2008  . ESOPHAGEAL STRICTURE 01/28/2008  . GERD 02/14/2008  . HYPERGLYCEMIA 11/16/2006  . HYPERLIPIDEMIA 02/14/2008  . INSOMNIA-SLEEP DISORDER-UNSPEC 02/11/2009  . 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 PROSTATE CANCER, HX OF 02/25/2000  . TRANSIENT ISCHEMIC ATTACKS, HX OF 11/16/2006  . Arthritis   . Atrial fibrillation 11/16/2006    remote CHF related to atrial fib with rapid ventricular response over 25 yrs per office note,  . ASTHMA 11/16/2006    sinusitis-    hx ?yeast patch/white patch on vocal cord as per [facility] 02/25/11-   . OBSTRUCTIVE SLEEP APNEA 11/10/2008  . SLEEP APNEA 10/03/2009    LOV Dr [name] 12/12 in EPIC    Moderate per patient- settings ?6/last sleep study years ago  . HYPERTENSION 11/16/2006  . Symptomatic bradycardia, secondary to sinus node dysfunction 07/08/2011    s/p [facility] Scientific PPM by Dr Sallyanne Kuster, upgraded to [place] ICD 12/13 by Dr Rayann Heman (SJM)  . Myocardial infarction 1993  . Pacemaker     [employer]  . ICD (implantable cardiac defibrillator) in place   . Anxiety   . Depression   . Pneumonia     hx of  . Headache(784.0)   . Cancer 2002    prostate cancer  . CORONARY ARTERY DISEASE 11/16/2006    LHC (07/2011): LAD with luminal irregularities, proximal circumflex 50%, EF 25%  . Chronic systolic CHF (congestive heart failure)     echo (10/02/12): EF 70-35%, grade 1 diastolic dysfunction, trivial AI, mild MR, mild RVE  . NICM (nonischemic cardiomyopathy)   . Stroke     Tia  . Lesion of vocal cord     bx begine   Past Surgical History  Procedure Laterality Date  . Tonsilectomy, adenoidectomy, bilateral myringotomy and tubes    . Knee arthroscopy      2 surgeries right and 1 left  . Prostate surgery  2/02    prostate cancer  . Uvulopalatopharyngoplasty  2013  . Esophagogastroduodenoscopy      with dilitation  . Lumbar laminectomy/decompression microdiscectomy  03/02/2011    Procedure: LUMBAR LAMINECTOMY/DECOMPRESSION MICRODISCECTOMY;  Surgeon: [name], MD;  Location: [facility];  Service: Orthopedics;  Laterality: N/A;  Decompression Lumbar 4 - Lumbar(X-Ray)  . Pacemaker insertion  06/2011    [employer] PPM implanted by Dr [employer]  . Cardiac defibrillator placement  01/16/12    Upgrade to a biventricular SJM ICD by DR Rameen Quinney  . Tonsillectomy  as child  .  Cardiac catheterization      several  . Back surgery    . Insert / replace / remove pacemaker    . Total knee arthroplasty Left 06/04/2012    Procedure: TOTAL KNEE ARTHROPLASTY;  Surgeon: [name], MD;  Location: [facility];  Service: Orthopedics;  Laterality: Left;  . Colonoscopy    . Vocal cord lesion bx      Current Outpatient Prescriptions  Medication Sig Dispense Refill  . acetaminophen (TYLENOL) 500 MG tablet Take 500 mg by mouth every 6 (six) hours as needed for pain.       Marland Kitchen albuterol (PROVENTIL HFA;VENTOLIN HFA) 108 (90 BASE) MCG/ACT inhaler Inhale 2 puffs into the lungs every 6 (six) hours as  needed for shortness of breath.       Marland Kitchen amiodarone (PACERONE) 200 MG tablet Take 0.5 tablets (100 mg total) by mouth daily.      . CRESTOR 5 MG tablet TAKE ONE TABLET IN THE EVENING  30 tablet  1  . mometasone (NASONEX) 50 MCG/ACT nasal spray TWO SPRAYS IN EACH NOSTRIL AS NEEDED FOR ALLERGIES      . pantoprazole (PROTONIX) 40 MG tablet TAKE ONE TABLET BY MOUTH ONCE DAILY  30 tablet  3  . polyethylene glycol powder (GLYCOLAX/MIRALAX) powder Take 17 g by mouth as needed.      . ramipril (ALTACE) 10 MG capsule TAKE TWO CAPSULES EVERY DAY  60 capsule  6  . rivastigmine (EXELON) 1.5 MG capsule Take 1 capsule (1.5 mg total) by mouth 2 (two) times daily.  60 capsule  2  . spironolactone (ALDACTONE) 25 MG tablet TAKE ONE TABLET BY MOUTH ONCE DAILY  30 tablet  0  . warfarin (COUMADIN) 2.5 MG tablet Take as directed by coumadin clinic  30 tablet  0  . zolpidem (AMBIEN) 5 MG tablet TAKE ONE TABLET AT BEDTIME AS NEEDED FOR SLEEP      . metoprolol succinate (TOPROL-XL) 50 MG 24 hr tablet Take half a tablet at night.  30 tablet  3   No current facility-administered medications for this visit.    Physical Exam: Filed Vitals:   10/07/13 1156  BP: 124/80  Pulse: 65  Height: 6\' 1"  (1.854 m)  Weight: 191 lb (86.637 kg)    GEN- The patient is well appearing, alert and oriented x 3 today.   Head- normocephalic, atraumatic Eyes-  Sclera clear, conjunctiva pink Ears- hearing intact Oropharynx- clear Lungs- Clear to ausculation bilaterally, normal work of breathing Chest- ICD pocket is well healed Heart- Regular rate and rhythm, no murmurs, rubs or gallops, PMI not laterally displaced GI- soft, NT, ND, + BS Extremities- no clubbing, cyanosis, or edema  ICD interrogation- reviewed in detail today,  See PACEART report  Assessment and Plan:  1.  Chronic systolic dysfunction euvolemic today Stable on an appropriate medical regimen Normal BiV ICD function I will enroll in our ICM clinic for monitoring  of [place] See Pace Art report No changes today EF has improved to 35-40% with CRT I will reduce toprol to 25mg  qhs due to fatigue  2. ETOH Cessation advised He is not ready to quit  3. HTN Stable No change required today  4. AFib CHADS2VASC score is at least 6.  I would therefore favor lifelong anticoagulation Maintaining sinus rhythm with amiodarone.  Given above fatigue, I will reduce amiodarone to 100mg  daily Lfts/tfst Stop ASA  5. Fatigue/ concerns for depression etoh cessation is advised He will follow up with Dr [name]  for CPAP evaluation given that he does not feel well rested upon waking. I will decrease toprol and amiodarone today I have encouraged him to establish with primary care for further evaluation/ management of possible depression. Check CBC/ TFTs  Merlin Return to see me in 3 months

## 2013-10-07 NOTE — Patient Instructions (Addendum)
Remote monitoring is used to monitor your ICD from home. This monitoring reduces the number of office visits required to check your device to one time per year. It allows [place] to keep an eye on the functioning of your device to ensure it is working properly. You are scheduled for a device check from home on 01-06-2014. You may send your transmission at any time that day. If you have a wireless device, the transmission will be sent automatically. After your physician reviews your transmission, you will receive a postcard with your next transmission date.  Your physician has recommended you make the following change in your medication:  1) DECREASE AMIODARONE to 100mg  daily 2) STOP ASPIRIN 3) STOP TAKING 50mg  METOPROLOL in the morning.  Your physician recommends that you schedule a follow-up appointment in: 3 MONTHS WITH DR. ALLRED.  It is recommended that you get a Primary Care Physician (Dr. Elease Hashimoto or Scarlette Calico)  Your physician recommends that you have lab work TODAY (LIVER, TSH)

## 2013-10-09 ENCOUNTER — Encounter: Payer: Self-pay | Admitting: Neurology

## 2013-10-09 ENCOUNTER — Ambulatory Visit (INDEPENDENT_AMBULATORY_CARE_PROVIDER_SITE_OTHER): Payer: Medicare Other | Admitting: Neurology

## 2013-10-09 VITALS — BP 90/68 | HR 82 | Temp 97.5°F | Resp 20 | Wt 195.3 lb

## 2013-10-09 DIAGNOSIS — R413 Other amnesia: Secondary | ICD-10-CM | POA: Diagnosis not present

## 2013-10-09 DIAGNOSIS — I2510 Atherosclerotic heart disease of native coronary artery without angina pectoris: Secondary | ICD-10-CM

## 2013-10-09 DIAGNOSIS — Z85828 Personal history of other malignant neoplasm of skin: Secondary | ICD-10-CM | POA: Diagnosis not present

## 2013-10-09 DIAGNOSIS — F32A Depression, unspecified: Secondary | ICD-10-CM | POA: Insufficient documentation

## 2013-10-09 DIAGNOSIS — L570 Actinic keratosis: Secondary | ICD-10-CM | POA: Diagnosis not present

## 2013-10-09 DIAGNOSIS — G3184 Mild cognitive impairment, so stated: Secondary | ICD-10-CM

## 2013-10-09 DIAGNOSIS — F3289 Other specified depressive episodes: Secondary | ICD-10-CM

## 2013-10-09 DIAGNOSIS — F329 Major depressive disorder, single episode, unspecified: Secondary | ICD-10-CM

## 2013-10-09 NOTE — Progress Notes (Signed)
NEUROLOGY FOLLOW UP OFFICE NOTE  CATALDO COSGRIFF [id number]  HISTORY OF PRESENT ILLNESS: Corey [name] is a 78 year old right-handed man with history of paroxysmal atrial fibrillation with PM, MI and CAD, TIA, hypertension, OSA, hyperlipidemia, alcohol abuse, and history of prostate cancer who follows up for amnestic MCI.  He is accompanied by his wife.  Records were reviewed.    UPDATE: He currently is on the Exelon.  He has been tolerating it okay.  The main issue today is depression.  He has been depressed for several years, triggered by several events in his life, such as pain due to multiple back surgeries and the passing of his daughter.  It has worsened particularly over the past two years.  He and his wife traveled around the country this past summer, but he didn't quite seem to enjoy it as much as she should.  He doesn't have any desire to go out and be active or social.  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  His diet is poor, and he is prone to skipping meals.  He does not get much exercise due to chronic back problems.  His sleep is poor.  He wakes up several times during the night.  He takes an Ambien at times.  He is irritable and will blow up at his wife at times.  He is not physically abusive.  He has not been crying.  His cardiologist discontinued the metoprolol earlier this week to see if it would help alleviate his symptoms.  He does plan on going down to [place] in a few weeks to see his buddies for their 58th high school reunion.   HISTORY: His wife and daughters note some mild memory problems for a little while, but has significantly been more noticeable over the past 5 months, since he retired in June.  Memory has been the primary problem.  He was called his daughters several times in a day asking about the same thing.  He still drives but is now mildly disoriented driving around home.  There has been no accidents or near-accidents,  but he is more irritable and impulsive, and will drive a little faster and tail gate.  He has poor night vision.  They have a new grandson.  Recently, he forgot about a party for his new grandchild and instead made plans with friends.  He has begun to forget names of family and friends.  He has not had any significant change in personality other than irritability.  Besides the aggressive driving, he uses foul language a little more.  He does not act inappropriately.  He does feel depressed, as he has been going through an adjustment since retiring.  He has had more falls recently.  One time, the rug slipped from underneath him.  Another time, he tripped over a hose at the gas station.  He also had experienced bowel incontinence.  He has not had any problems at work prior to retirement.  He has not had hallucinations or delusions. He has not had hallucinations or delusions.  He has not had difficulty performing everyday tasks.  He notes some problems but doesn't think it is as bad as his family makes it out to be.    He worked in [employer] for many years.  Since retirement, he typically doesn't do too much.  He will sit around.  He doesn't watch much TV during the day.  He reads the  newspaper daily and able to retain information.  He is able to stay focus.  He and his wife are not as social as they used to be.    His mother had dementia.   Over the past year or so, he has had several operations, including pacemaker and back surgery.  He attributes these changes to his multiple surgeries.  He did not tolerate Aricept due to vivid dreams.  01/09/13 MOCA 25/30 for impaired delayed recall. 01/09/13 LABS:  B12 285, methylmalonic acid 0.14 01/11/13 CT Head:  small vessel disease with remote infarcts in the left temporal lobe and left cerebellum, with accompanying diffuse atrophy 06/10/13 MMSE 29/30.  PAST MEDICAL HISTORY: Past Medical History  Diagnosis Date  . Alcohol abuse, episodic 05/19/2008  . ALLERGIC  RHINITIS 04/15/2009  . COLONIC POLYPS, ADENOMATOUS, HX OF 02/14/2008  . DIVERTICULOSIS, COLON 02/14/2008  . ESOPHAGEAL STRICTURE 01/28/2008  . GERD 02/14/2008  . HYPERGLYCEMIA 11/16/2006  . HYPERLIPIDEMIA 02/14/2008  . INSOMNIA-SLEEP DISORDER-UNSPEC 02/11/2009  . 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 PROSTATE CANCER, HX OF 02/25/2000  . TRANSIENT ISCHEMIC ATTACKS, HX OF 11/16/2006  . Arthritis   . Atrial fibrillation 11/16/2006    remote CHF related to atrial fib with rapid ventricular response over 25 yrs per office note,  . ASTHMA 11/16/2006    sinusitis-    hx ?yeast patch/white patch on vocal cord as per [facility] 02/25/11-   . OBSTRUCTIVE SLEEP APNEA 11/10/2008  . SLEEP APNEA 10/03/2009    LOV Dr [name] 12/12 in EPIC    Moderate per patient- settings ?6/last sleep study years ago  . HYPERTENSION 11/16/2006  . Symptomatic bradycardia, secondary to sinus node dysfunction 07/08/2011    s/p [facility] Scientific PPM by Dr Sallyanne Kuster, upgraded to [place] ICD 12/13 by Dr Rayann Heman ([facility])  . Myocardial infarction 1993  . Pacemaker     St. Jude  . ICD (implantable cardiac defibrillator) in place   . Anxiety   . Depression   . Pneumonia     hx of  . Headache(784.0)   . Cancer 2002    prostate cancer  . CORONARY ARTERY DISEASE 11/16/2006    LHC (07/2011): LAD with luminal irregularities, proximal circumflex 50%, EF 25%  . Chronic systolic CHF (congestive heart failure)     echo (10/02/12): EF 50-93%, grade 1 diastolic dysfunction, trivial AI, mild MR, mild RVE  . NICM (nonischemic cardiomyopathy)   . Stroke     Tia  . Lesion of vocal cord     bx begine    MEDICATIONS: Current Outpatient Prescriptions on File Prior to Visit  Medication Sig Dispense Refill  . acetaminophen (TYLENOL) 500 MG tablet Take 500 mg by mouth every 6 (six) hours as needed for pain.       Marland Kitchen albuterol (PROVENTIL HFA;VENTOLIN HFA) 108 (90 BASE) MCG/ACT inhaler Inhale 2 puffs into the lungs every 6 (six) hours as needed for shortness of  breath.       Marland Kitchen amiodarone (PACERONE) 200 MG tablet Take 0.5 tablets (100 mg total) by mouth daily.      . CRESTOR 5 MG tablet TAKE ONE TABLET IN THE EVENING  30 tablet  1  . metoprolol succinate (TOPROL-XL) 50 MG 24 hr tablet Take half a tablet at night.  30 tablet  3  . mometasone (NASONEX) 50 MCG/ACT nasal spray TWO SPRAYS IN EACH NOSTRIL AS NEEDED FOR ALLERGIES      . pantoprazole (PROTONIX) 40 MG tablet TAKE ONE TABLET BY MOUTH ONCE DAILY  30 tablet  3  . polyethylene glycol powder (GLYCOLAX/MIRALAX) powder Take 17 g by mouth as needed.      . ramipril (ALTACE) 10 MG capsule TAKE TWO CAPSULES EVERY DAY  60 capsule  6  . rivastigmine (EXELON) 1.5 MG capsule Take 1 capsule (1.5 mg total) by mouth 2 (two) times daily.  60 capsule  2  . spironolactone (ALDACTONE) 25 MG tablet TAKE ONE TABLET BY MOUTH ONCE DAILY  30 tablet  2  . warfarin (COUMADIN) 2.5 MG tablet Take as directed by coumadin clinic  30 tablet  0  . zolpidem (AMBIEN) 5 MG tablet TAKE ONE TABLET AT BEDTIME AS NEEDED FOR SLEEP       No current facility-administered medications on file prior to visit.    ALLERGIES: Allergies  Allergen Reactions  . Hydrocodone Other (See Comments)    Severe panic attacks  . Oxycodone Other (See Comments)    Severe panic attacks    FAMILY HISTORY: Family History  Problem Relation Age of Onset  . Heart attack Father   . Heart attack Brother   . Heart disease Maternal Uncle   . Heart attack Maternal Uncle   . Colon cancer Neg Hx   . Esophageal cancer Neg Hx   . Rectal cancer Neg Hx   . Prostate cancer Neg Hx   . Stomach cancer Neg Hx     SOCIAL HISTORY: History   Social History  . Marital Status: Married    Spouse Name: N/A    Number of Children: N/A  . Years of Education: N/A   Occupational History  . Not on file.   Social History Main Topics  . Smoking status: Former Smoker -- 1.00 packs/day for 4 years    Types: Cigarettes    Quit date: 03/22/1964  . Smokeless  tobacco: Never Used     Comment: reports smoked socially  . Alcohol Use: 0.0 oz/week     Comment: 2 mixed drinks daily, some days  . Drug Use: No  . Sexual Activity: Yes    Partners: Female   Other Topics Concern  . Not on file   Social History Narrative  . No narrative on file    REVIEW OF SYSTEMS: Constitutional: No fevers, chills, or sweats, no generalized fatigue, change in appetite Eyes: No visual changes, double vision, eye pain Ear, nose and throat: No hearing loss, ear pain, nasal congestion, sore throat Cardiovascular: No chest pain, palpitations Respiratory:  No shortness of breath at rest or with exertion, wheezes GastrointestinaI: No nausea, vomiting, diarrhea, abdominal pain, fecal incontinence Genitourinary:  No dysuria, urinary retention or frequency Musculoskeletal:  Back pain Integumentary: No rash, pruritus, skin lesions Neurological: as above Psychiatric: Depression, insomnia Endocrine: No palpitations, fatigue, diaphoresis, mood swings, change in appetite, change in weight, increased thirst Hematologic/Lymphatic:  No anemia, purpura, petechiae. Allergic/Immunologic: no itchy/runny eyes, nasal congestion, recent allergic reactions, rashes  PHYSICAL EXAM: Filed Vitals:   10/09/13 1449  BP: 90/68  Pulse: 82  Temp: 97.5 F (36.4 C)  Resp: 20   General: No acute distress Head:  Normocephalic/atraumatic Neck: supple, no paraspinal tenderness, full range of motion Heart:  Regular rate and rhythm Lungs:  Clear to auscultation bilaterally Back: No paraspinal tenderness Neurological Exam: alert and oriented to person, place, and time. Attention span and concentration intact, delayed recall mildly impaired, and remote memory intact, fund of knowledge intact.   Montreal Cognitive Assessment  10/09/2013  Visuospatial/ Executive (0/5) 5  Naming (0/3) 3  Attention:  Read list of digits (0/2) 2  Attention: Read list of letters (0/1) 1  Attention: Serial 7  subtraction starting at 100 (0/3) 3  Language: Repeat phrase (0/2) 2  Language : Fluency (0/1) 1  Abstraction (0/2) 2  Delayed Recall (0/5) 2  Orientation (0/6) 6  Total 27    Speech fluent and not dysarthric, language intact.  CN II-XII intact. Fundi not visualized.  Bulk and tone normal, muscle strength 5/5 throughout.  Deep tendon reflexes 1+ in upper extremities, absent in lower extremities.  Finger to nose  intact.  Gait normal, Romberg negative.  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  Continue Exelon 2.  We will see how he responds over the next month off the metoprolol.  He is to call in 4 weeks.  If depressed mood persists, would favor starting an antidepressant such as citalopram and counseling 3.  Encouraged regular walks and healthy diet. 4.  Follow up in 6 months.  Metta Clines, DO

## 2013-10-09 NOTE — Patient Instructions (Signed)
Cognitive testing today shows everything is stable.  The primary problem we are concerned about is depression.  Let's see how you do now that you are off the metoprolol.  Call in 4 weeks.  If depression is still an issue, I would favor starting an antidepressant in addition to counseling.  I would likely choose to try Celexa first.  Other options include Zoloft or Lexapro.  Schedule a follow up in 6 months or as needed.

## 2013-10-10 ENCOUNTER — Telehealth: Payer: Self-pay | Admitting: Internal Medicine

## 2013-10-10 ENCOUNTER — Ambulatory Visit (INDEPENDENT_AMBULATORY_CARE_PROVIDER_SITE_OTHER): Payer: Medicare Other

## 2013-10-10 DIAGNOSIS — J309 Allergic rhinitis, unspecified: Secondary | ICD-10-CM

## 2013-10-10 NOTE — Telephone Encounter (Signed)
Left vm for pt to call and make an appt with Dr. Jones.  °

## 2013-10-10 NOTE — Telephone Encounter (Signed)
Pt came in and stated that heart care doctor refer Corey [name] to be new pt of Dr. Ronnald Ramp because he need PCP. inform pt that Dr.Jones is not taking new pt but he request for [place] to ask. Please advise.

## 2013-10-10 NOTE — Telephone Encounter (Signed)
I have said yes to this 

## 2013-10-14 ENCOUNTER — Other Ambulatory Visit: Payer: Self-pay | Admitting: *Deleted

## 2013-10-14 ENCOUNTER — Encounter: Payer: Self-pay | Admitting: Internal Medicine

## 2013-10-14 DIAGNOSIS — C61 Malignant neoplasm of prostate: Secondary | ICD-10-CM | POA: Diagnosis not present

## 2013-10-14 MED ORDER — AMIODARONE HCL 200 MG PO TABS: 100.0000 mg | ORAL_TABLET | Freq: Every day | ORAL | Status: DC

## 2013-10-16 ENCOUNTER — Ambulatory Visit (INDEPENDENT_AMBULATORY_CARE_PROVIDER_SITE_OTHER): Payer: Medicare Other | Admitting: Surgery

## 2013-10-16 DIAGNOSIS — Z7901 Long term (current) use of anticoagulants: Secondary | ICD-10-CM

## 2013-10-16 DIAGNOSIS — I4891 Unspecified atrial fibrillation: Secondary | ICD-10-CM | POA: Diagnosis not present

## 2013-10-16 DIAGNOSIS — Z5181 Encounter for therapeutic drug level monitoring: Secondary | ICD-10-CM

## 2013-10-16 LAB — POCT INR: INR: 2.9

## 2013-10-21 ENCOUNTER — Other Ambulatory Visit: Payer: Self-pay | Admitting: Internal Medicine

## 2013-10-21 DIAGNOSIS — C61 Malignant neoplasm of prostate: Secondary | ICD-10-CM | POA: Diagnosis not present

## 2013-10-22 ENCOUNTER — Ambulatory Visit (INDEPENDENT_AMBULATORY_CARE_PROVIDER_SITE_OTHER): Payer: Medicare Other

## 2013-10-22 DIAGNOSIS — J309 Allergic rhinitis, unspecified: Secondary | ICD-10-CM | POA: Diagnosis not present

## 2013-11-07 ENCOUNTER — Encounter: Payer: Medicare Other | Admitting: *Deleted

## 2013-11-08 ENCOUNTER — Telehealth: Payer: Self-pay | Admitting: Cardiology

## 2013-11-08 NOTE — Telephone Encounter (Signed)
LMOVM reminding pt to send remote transmission.   

## 2013-11-11 ENCOUNTER — Ambulatory Visit (INDEPENDENT_AMBULATORY_CARE_PROVIDER_SITE_OTHER): Payer: Medicare Other | Admitting: *Deleted

## 2013-11-11 ENCOUNTER — Ambulatory Visit (INDEPENDENT_AMBULATORY_CARE_PROVIDER_SITE_OTHER): Payer: Medicare Other

## 2013-11-11 ENCOUNTER — Other Ambulatory Visit: Payer: Self-pay | Admitting: Internal Medicine

## 2013-11-11 DIAGNOSIS — Z95810 Presence of automatic (implantable) cardiac defibrillator: Secondary | ICD-10-CM | POA: Diagnosis not present

## 2013-11-11 DIAGNOSIS — I4891 Unspecified atrial fibrillation: Secondary | ICD-10-CM | POA: Diagnosis not present

## 2013-11-11 DIAGNOSIS — Z5181 Encounter for therapeutic drug level monitoring: Secondary | ICD-10-CM

## 2013-11-11 DIAGNOSIS — I5022 Chronic systolic (congestive) heart failure: Secondary | ICD-10-CM | POA: Diagnosis not present

## 2013-11-11 DIAGNOSIS — Z7901 Long term (current) use of anticoagulants: Secondary | ICD-10-CM | POA: Diagnosis not present

## 2013-11-11 LAB — POCT INR: INR: 4

## 2013-11-12 ENCOUNTER — Encounter: Payer: Self-pay | Admitting: *Deleted

## 2013-11-12 NOTE — Progress Notes (Signed)
EPIC Encounter for ICM Monitoring  Patient Name: Corey [name] is a 78 y.o. male Date: 11/12/2013 Primary Care Physican: Scarlette Calico, MD Primary Cardiologist: Allred Electrophysiologist: Allred Dry Weight: 186 lbs  Bi-V pacing: > 99%       In the past month, have you:  1. Gained more than 2 pounds in a day or more than 5 pounds in a week? no  2. Had changes in your medications (with verification of current medications)? Yes. The patient saw Dr. Rayann Heman on 10/07/13: 1) amiodarone was decreased to 100 mg daily, 2) toprol was decreased to 25 mg daily due to fatigue, 3) aspirin was stopped. Per the patient, he is feeling some better with the decreased dose of toprol.   3. Had more shortness of breath than is usual for you? no  4. Limited your activity because of shortness of breath? no  5. Not been able to sleep because of shortness of breath? no  6. Had increased swelling in your feet or ankles? no  7. Had symptoms of dehydration (dizziness, dry mouth, increased thirst, decreased urine output) no. The patient reports he has trouble with his balance, but this has been going on for about 3 years and is not getting any worse.  8. Had changes in sodium restriction? no  9. Been compliant with medication? Yes   ICM trend:   Follow-up plan: ICM clinic phone appointment: 12/16/13  Copy of note sent to patient's primary care physician, primary cardiologist, and device following physician.  Alvis Lemmings, RN, BSN 11/12/2013 8:57 AM

## 2013-11-13 ENCOUNTER — Encounter: Payer: Self-pay | Admitting: Internal Medicine

## 2013-11-13 ENCOUNTER — Ambulatory Visit (INDEPENDENT_AMBULATORY_CARE_PROVIDER_SITE_OTHER): Payer: Medicare Other | Admitting: Internal Medicine

## 2013-11-13 ENCOUNTER — Ambulatory Visit (INDEPENDENT_AMBULATORY_CARE_PROVIDER_SITE_OTHER)
Admission: RE | Admit: 2013-11-13 | Discharge: 2013-11-13 | Disposition: A | Discharge status: Home / Self Care | Payer: Medicare Other | Source: Ambulatory Visit | Attending: Internal Medicine | Admitting: Internal Medicine

## 2013-11-13 ENCOUNTER — Other Ambulatory Visit (INDEPENDENT_AMBULATORY_CARE_PROVIDER_SITE_OTHER): Payer: Medicare Other

## 2013-11-13 VITALS — BP 128/78 | HR 72 | Temp 98.5°F | Resp 16 | Ht 73.0 in | Wt 193.0 lb

## 2013-11-13 DIAGNOSIS — R739 Hyperglycemia, unspecified: Secondary | ICD-10-CM

## 2013-11-13 DIAGNOSIS — G3184 Mild cognitive impairment, so stated: Secondary | ICD-10-CM

## 2013-11-13 DIAGNOSIS — M48061 Spinal stenosis, lumbar region without neurogenic claudication: Secondary | ICD-10-CM

## 2013-11-13 DIAGNOSIS — D519 Vitamin B12 deficiency anemia, unspecified: Secondary | ICD-10-CM | POA: Diagnosis not present

## 2013-11-13 DIAGNOSIS — I10 Essential (primary) hypertension: Secondary | ICD-10-CM

## 2013-11-13 DIAGNOSIS — E785 Hyperlipidemia, unspecified: Secondary | ICD-10-CM | POA: Diagnosis not present

## 2013-11-13 DIAGNOSIS — K219 Gastro-esophageal reflux disease without esophagitis: Secondary | ICD-10-CM

## 2013-11-13 DIAGNOSIS — Z23 Encounter for immunization: Secondary | ICD-10-CM | POA: Diagnosis not present

## 2013-11-13 DIAGNOSIS — I252 Old myocardial infarction: Secondary | ICD-10-CM

## 2013-11-13 DIAGNOSIS — R1010 Upper abdominal pain, unspecified: Secondary | ICD-10-CM

## 2013-11-13 DIAGNOSIS — R109 Unspecified abdominal pain: Secondary | ICD-10-CM

## 2013-11-13 DIAGNOSIS — D508 Other iron deficiency anemias: Secondary | ICD-10-CM | POA: Diagnosis not present

## 2013-11-13 DIAGNOSIS — I2510 Atherosclerotic heart disease of native coronary artery without angina pectoris: Secondary | ICD-10-CM

## 2013-11-13 DIAGNOSIS — Z8546 Personal history of malignant neoplasm of prostate: Secondary | ICD-10-CM

## 2013-11-13 DIAGNOSIS — G4700 Insomnia, unspecified: Secondary | ICD-10-CM

## 2013-11-13 DIAGNOSIS — K222 Esophageal obstruction: Secondary | ICD-10-CM

## 2013-11-13 DIAGNOSIS — I480 Paroxysmal atrial fibrillation: Secondary | ICD-10-CM

## 2013-11-13 LAB — COMPREHENSIVE METABOLIC PANEL
ALBUMIN: 3.6 g/dL (ref 3.5–5.2)
ALK PHOS: 79 U/L (ref 39–117)
ALT: 31 U/L (ref 0–53)
AST: 43 U/L — AB (ref 0–37)
BUN: 19 mg/dL (ref 6–23)
CO2: 33 mEq/L — ABNORMAL HIGH (ref 19–32)
Calcium: 8.8 mg/dL (ref 8.4–10.5)
Chloride: 104 mEq/L (ref 96–112)
Creatinine, Ser: 1.3 mg/dL (ref 0.4–1.5)
GFR: 59.7 mL/min — ABNORMAL LOW (ref >60.00)
Glucose, Bld: 84 mg/dL (ref 70–99)
Potassium: 4.6 mEq/L (ref 3.5–5.1)
Sodium: 140 mEq/L (ref 135–145)
Total Bilirubin: 0.9 mg/dL (ref 0.2–1.2)
Total Protein: 7.2 g/dL (ref 6.0–8.3)

## 2013-11-13 LAB — CBC WITH DIFFERENTIAL/PLATELET
BASOS PCT: 1.8 % (ref 0.0–3.0)
Basophils Absolute: 0.1 10*3/uL (ref 0.0–0.1)
EOS ABS: 0.1 10*3/uL (ref 0.0–0.7)
Eosinophils Relative: 3 % (ref 0.0–5.0)
HEMATOCRIT: 38.4 % — AB (ref 39.0–52.0)
HEMOGLOBIN: 12.6 g/dL — AB (ref 13.0–17.0)
Lymphocytes Relative: 32 % (ref 12.0–46.0)
Lymphs Abs: 1.2 10*3/uL (ref 0.7–4.0)
MCHC: 33 g/dL (ref 30.0–36.0)
MCV: 89.2 fl (ref 78.0–100.0)
Monocytes Absolute: 0.4 10*3/uL (ref 0.1–1.0)
Monocytes Relative: 10.6 % (ref 3.0–12.0)
NEUTROS ABS: 2 10*3/uL (ref 1.4–7.7)
Neutrophils Relative %: 52.6 % (ref 43.0–77.0)
Platelets: 219 10*3/uL (ref 150.0–400.0)
RBC: 4.3 Mil/uL (ref 4.22–5.81)
RDW: 14.2 % (ref 11.5–15.5)
WBC: 3.9 10*3/uL — ABNORMAL LOW (ref 4.0–10.5)

## 2013-11-13 LAB — URINALYSIS, ROUTINE W REFLEX MICROSCOPIC
Hgb urine dipstick: NEGATIVE
Leukocytes, UA: NEGATIVE
Nitrite: NEGATIVE
PH: 6 (ref 5.0–8.0)
Specific Gravity, Urine: 1.025 (ref 1.000–1.030)
TOTAL PROTEIN, URINE-UPE24: NEGATIVE
Urine Glucose: NEGATIVE
Urobilinogen, UA: 0.2 (ref 0.0–1.0)

## 2013-11-13 LAB — IBC PANEL
IRON: 71 ug/dL (ref 42–165)
Saturation Ratios: 15 % — ABNORMAL LOW (ref 20.0–50.0)
Transferrin: 338.9 mg/dL (ref 212.0–360.0)

## 2013-11-13 LAB — FERRITIN: Ferritin: 19.5 ng/mL — ABNORMAL LOW (ref 22.0–322.0)

## 2013-11-13 LAB — LIPID PANEL
CHOL/HDL RATIO: 3
Cholesterol: 161 mg/dL (ref 0–200)
HDL: 55.5 mg/dL (ref >39.00)
LDL CALC: 80 mg/dL (ref 0–99)
NonHDL: 105.5
TRIGLYCERIDES: 129 mg/dL (ref 0.0–149.0)
VLDL: 25.8 mg/dL (ref 0.0–40.0)

## 2013-11-13 LAB — PSA: PSA: 0.02 ng/mL — ABNORMAL LOW (ref 0.10–4.00)

## 2013-11-13 LAB — HEMOGLOBIN A1C: Hgb A1c MFr Bld: 6 % (ref 4.6–6.5)

## 2013-11-13 LAB — TSH: TSH: 2.12 u[IU]/mL (ref 0.35–4.50)

## 2013-11-13 MED ORDER — CYANOCOBALAMIN 1000 MCG/ML IJ SOLN
1000.0000 ug | Freq: Once | INTRAMUSCULAR | Status: AC
  Administered 2013-11-13: 1000 ug via INTRAMUSCULAR

## 2013-11-13 MED ORDER — FOLIC ACID 1 MG PO TABS: 1.0000 mg | ORAL_TABLET | Freq: Every day | ORAL | Status: DC

## 2013-11-13 NOTE — Progress Notes (Signed)
Pre visit review using our clinic review tool, if applicable. No additional management support is needed unless otherwise documented below in the visit note. 

## 2013-11-13 NOTE — Progress Notes (Signed)
Subjective:    Patient ID: Corey [name], male    DOB: 14-Jun-1935, 78 y.o.   MRN: [id number]  Flank Pain This is a new problem. The current episode started 1 to 4 weeks ago. The problem occurs intermittently. The problem is unchanged. Pain location: right lower flank. The quality of the pain is described as aching. The pain does not radiate. The pain is at a severity of 2/10. The pain is mild. The pain is worse during the day. The symptoms are aggravated by bending and position. Associated symptoms include weakness. Pertinent negatives include no abdominal pain, bladder incontinence, bowel incontinence, chest pain, dysuria, fever, headaches, leg pain, numbness, paresis, paresthesias, pelvic pain, perianal numbness, tingling or weight loss. Risk factors include history of cancer. He has tried analgesics for the symptoms. The treatment provided moderate relief.      Review of Systems  Constitutional: Positive for fatigue. Negative for fever, chills, weight loss, diaphoresis, activity change, appetite change and unexpected weight change.  HENT: Negative.   Eyes: Negative.  Negative for visual disturbance.  Respiratory: Positive for apnea. Negative for cough, choking, chest tightness, shortness of breath, wheezing and stridor.   Cardiovascular: Negative.  Negative for chest pain, palpitations and leg swelling.  Gastrointestinal: Negative.  Negative for nausea, vomiting, abdominal pain, diarrhea, constipation, blood in stool, abdominal distention and bowel incontinence.  Endocrine: Negative.   Genitourinary: Positive for flank pain. Negative for bladder incontinence, dysuria, urgency, frequency, hematuria, decreased urine volume, discharge, penile swelling, scrotal swelling, enuresis, difficulty urinating, genital sores, penile pain, testicular pain and pelvic pain.  Musculoskeletal: Positive for arthralgias, back pain and gait problem (he feels off balance). Negative for joint swelling,  myalgias, neck pain and neck stiffness.  Skin: Negative.   Allergic/Immunologic: Negative.   Neurological: Positive for weakness. Negative for dizziness, tingling, tremors, seizures, syncope, speech difficulty, light-headedness, numbness, headaches and paresthesias.  Hematological: Negative.  Negative for adenopathy. Does not bruise/bleed easily.  Psychiatric/Behavioral: Positive for sleep disturbance, dysphoric mood and decreased concentration. Negative for suicidal ideas, behavioral problems, confusion, self-injury and agitation. The patient is nervous/anxious. The patient is not hyperactive.        Objective:   Physical Exam  Vitals reviewed. Constitutional: He is oriented to person, place, and time. He appears well-developed and well-nourished.  Non-toxic appearance. He does not have a sickly appearance. He does not appear ill. No distress.  HENT:  Head: Normocephalic and atraumatic.  Mouth/Throat: Oropharynx is clear and moist. No oropharyngeal exudate.  Eyes: Conjunctivae are normal. Right eye exhibits no discharge. Left eye exhibits no discharge. No scleral icterus.  Neck: Normal range of motion. Neck supple. No JVD present. No tracheal deviation present. No thyromegaly present.  Cardiovascular: Normal rate, regular rhythm, normal heart sounds and intact distal pulses.  Exam reveals no gallop and no friction rub.   No murmur heard. Pulmonary/Chest: Effort normal and breath sounds normal. No stridor. No respiratory distress. He has no wheezes. He has no rales. He exhibits no tenderness.  Abdominal: Soft. Normal appearance and bowel sounds are normal. He exhibits no distension and no mass. There is no hepatosplenomegaly, splenomegaly or hepatomegaly. There is no tenderness. There is no rigidity, no rebound, no guarding, no CVA tenderness, no tenderness at McBurney's point and negative Murphy's sign. No hernia. Hernia confirmed negative in the ventral area, confirmed negative in the right  inguinal area and confirmed negative in the left inguinal area.  Musculoskeletal: Normal range of motion. He exhibits no edema and no tenderness.  Cervical back: Normal.       Thoracic back: Normal.       Lumbar back: Normal. He exhibits normal range of motion, no tenderness, no bony tenderness, no swelling, no edema, no deformity, no laceration, no pain, no spasm and normal pulse.  Lymphadenopathy:    He has no cervical adenopathy.  Neurological: He is oriented to person, place, and time.  Skin: Skin is warm and dry. No rash noted. He is not diaphoretic. No erythema. No pallor.  Psychiatric: He has a normal mood and affect. His behavior is normal. Judgment and thought content normal.    Lab Results  Component Value Date   WBC 5.2 10/05/2013   HGB 12.1* 10/05/2013   HCT 36.0* 10/05/2013   PLT 213 10/05/2013   GLUCOSE 94 10/05/2013   CHOL 148 07/23/2012   TRIG 114.0 07/23/2012   HDL 48.00 07/23/2012   LDLCALC 77 07/23/2012   ALT 37 10/07/2013   AST 49* 10/07/2013   NA 140 10/05/2013   K 5.1 10/05/2013   CL 103 10/05/2013   CREATININE 1.26 10/05/2013   BUN 16 10/05/2013   CO2 27 10/05/2013   TSH 0.95 10/07/2013   PSA 0.03* 12/13/2006   INR 4.0 11/11/2013   HGBA1C 6.0 09/02/2010        Assessment & Plan:

## 2013-11-13 NOTE — Patient Instructions (Signed)
Flank Pain °Flank pain refers to pain that is located on the side of the body between the upper abdomen and the back. The pain may occur over a short period of time (acute) or may be long-term or reoccurring (chronic). It may be mild or severe. Flank pain can be caused by many things. °CAUSES  °Some of the more common causes of flank pain include: °· Muscle strains.   °· Muscle spasms.   °· A disease of your spine (vertebral disk disease).   °· A lung infection (pneumonia).   °· Fluid around your lungs (pulmonary edema).   °· A kidney infection.   °· Kidney stones.   °· A very painful skin rash caused by the chickenpox virus (shingles).   °· Gallbladder disease.   °HOME CARE INSTRUCTIONS  °Home care will depend on the cause of your pain. In general, °· Rest as directed by your caregiver. °· Drink enough fluids to keep your urine clear or pale yellow. °· Only take over-the-counter or prescription medicines as directed by your caregiver. Some medicines may help relieve the pain. °· Tell your caregiver about any changes in your pain. °· Follow up with your caregiver as directed. °SEEK IMMEDIATE MEDICAL CARE IF:  °· Your pain is not controlled with medicine.   °· You have new or worsening symptoms. °· Your pain increases.   °· You have abdominal pain.   °· You have shortness of breath.   °· You have persistent nausea or vomiting.   °· You have swelling in your abdomen.   °· You feel faint or pass out.   °· You have blood in your urine. °· You have a fever or persistent symptoms for more than 2-3 days. °· You have a fever and your symptoms suddenly get worse. °MAKE SURE YOU:  °· Understand these instructions. °· Will watch your condition. °· Will get help right away if you are not doing well or get worse. °Document Released: 03/03/2005 Document Revised: 10/05/2011 Document Reviewed: 08/25/2011 °ExitCare® Patient Information ©2015 [employer]. This information is not intended to replace advice given to you by your  health care provider. Make sure you discuss any questions you have with your health care provider. ° °

## 2013-11-13 NOTE — Assessment & Plan Note (Signed)
My order today for a B12 and folate level was blocked by the software Will start B12 and folate supplements

## 2013-11-14 ENCOUNTER — Encounter: Payer: Self-pay | Admitting: Internal Medicine

## 2013-11-14 ENCOUNTER — Ambulatory Visit (INDEPENDENT_AMBULATORY_CARE_PROVIDER_SITE_OTHER): Payer: Medicare Other

## 2013-11-14 DIAGNOSIS — J309 Allergic rhinitis, unspecified: Secondary | ICD-10-CM | POA: Diagnosis not present

## 2013-11-14 NOTE — Assessment & Plan Note (Signed)
CBC and iron levels are stable Will start B12 and folate

## 2013-11-14 NOTE — Assessment & Plan Note (Signed)
His BP is well controlled Lytes and renal function are stable 

## 2013-11-14 NOTE — Assessment & Plan Note (Signed)
Exam, plain films, labs are all normal This is MS pain, will follow for now

## 2013-11-18 ENCOUNTER — Encounter: Payer: Self-pay | Admitting: Internal Medicine

## 2013-11-18 ENCOUNTER — Ambulatory Visit (INDEPENDENT_AMBULATORY_CARE_PROVIDER_SITE_OTHER): Payer: Medicare Other | Admitting: Internal Medicine

## 2013-11-18 ENCOUNTER — Ambulatory Visit: Payer: Medicare Other

## 2013-11-18 VITALS — BP 122/70 | HR 69 | Ht 73.0 in | Wt 191.8 lb

## 2013-11-18 DIAGNOSIS — J309 Allergic rhinitis, unspecified: Secondary | ICD-10-CM

## 2013-11-18 DIAGNOSIS — I2510 Atherosclerotic heart disease of native coronary artery without angina pectoris: Secondary | ICD-10-CM | POA: Diagnosis not present

## 2013-11-18 DIAGNOSIS — Z23 Encounter for immunization: Secondary | ICD-10-CM

## 2013-11-18 DIAGNOSIS — G4733 Obstructive sleep apnea (adult) (pediatric): Secondary | ICD-10-CM | POA: Diagnosis not present

## 2013-11-18 DIAGNOSIS — J302 Other seasonal allergic rhinitis: Secondary | ICD-10-CM

## 2013-11-18 DIAGNOSIS — J3089 Other allergic rhinitis: Secondary | ICD-10-CM

## 2013-11-18 MED ORDER — CLONAZEPAM 0.5 MG PO TABS
ORAL_TABLET | ORAL | Status: DC
Start: 2013-11-18 — End: 2014-01-27

## 2013-11-18 NOTE — Assessment & Plan Note (Signed)
Continues allergy vaccine. He decided it was helping.

## 2013-11-18 NOTE — Assessment & Plan Note (Signed)
Plan- have Advanced set his new machine to 4-10. See how that range feels. Then get download

## 2013-11-18 NOTE — Progress Notes (Signed)
11/01/10-78 year old male with remote smoking history, followed for chronic asthmatic bronchitis/bronchiectasis, history of sinusitis, allergic rhinitis, sleep apnea Last here 03/15/10 He wants to wait until November for flu shot. He has not had recent or acute symptoms related to his breathing and has not used Advair in 6 months. He didn't find it helpful. Rare use of Proair, maybe once every 3-4 months. He continues allergy vaccine, missing a dose sometimes if he is away from town. He notices no problems and offers no questions. He continues to use CPAP all night every night. He had had a palatoplasty he doesn't remember when and wonders if this was done at the time of his tonsillectomy as a young child. He notices persistent narrowness through the right nostril related to his septal deviation. Nasonex helps. We discussed a trial of Breathe Right strips to be used under his CPAP mask. He can feel when his atrial fibrillation is more active, as in the past 2 days. He treats his difficulty initiating and maintaining sleep with a one half of a 5 mg Ambien tablet when needed.   11/01/10-78 year old male with remote smoking history, followed for chronic asthmatic bronchitis/bronchiectasis, history of sinusitis, allergic rhinitis, sleep apnea Walked in today as an acute unscheduled visit complaining of cough since lunchtime. He got a throat tickle walking to his car and got gradually worse. Persistent for 4 hours with chest congestion. Chronic feeling of raspy throat clearing. Orthopedic knee surgery one month ago/Dr. Tonita Cong. Oxycodone given in hospital cause shaking, insomnia, panic attack. When he got home he took some leftover hydrocodone and had the same experience, up all night, pacing and anxious. This is set off his chronic anxiety. Dr Tonita Cong called in lorazepam- helps. Continues CPAP all night every night.  05/16/11- 78 year old male with remote smoking history, followed for chronic asthmatic  bronchitis/bronchiectasis, history of sinusitis, allergic rhinitis, sleep apnea, AFib/ CAD/ MI, GERD He admits his anxiety is getting to him. 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 Apparently he was put to sleep with intention to remove the lesion but they found they could not. He is left persistently hoarse.  He had just had back surgery 8 weeks previously and has been taking oxycodone for back pain. Lorazepam helps him sleep at night.  CPAP continues all night every night with good control. 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  11/03/11- 78 year old male with remote smoking history, followed for chronic asthmatic bronchitis/bronchiectasis, history of sinusitis, allergic rhinitis, sleep apnea, AFib/ CAD/ MI, GERD He wants to wait until November for flu shot. Continues allergy vaccine 1:10 GH. He has not had much respiratory problem until the last one or 2 weeks when he began throat clearing. Has used his rescue inhaler only once or twice. In February he had spine surgery for spinal stenosis. Oxycodone caused panic attack lasting 3 months. Then had a vocal cord biopsy at [facility]. Exacerbation of atrial fibrillation treated with pacemaker in June, then amiodarone. He still feels more short of breath than normal but is starting cardiac rehabilitation. CPAP/ Advanced.  12/26/11-  78 year old male with remote smoking history, followed for chronic asthmatic bronchitis/bronchiectasis, history of sinusitis, allergic rhinitis, sleep apnea, AFib/ CAD/ MI, GERD     Wife here FOLLOWS FOR: on last dose of Amoxicillin(dx'd with PNA at hospital 12-16-11; feels somewhat better but not 100%.  Still on allergy vaccine. Acute  illness is now improving. Antibiotic ends today. No fever, minimal phlegm, throat tickle, shortness of breath with sustained speech. Denies cough or wheeze. CXR  12/18/11- reviewed IMPRESSION:  Improved right lower lobe airspace disease. Small right effusion  noted.  Original Report Authenticated By: Orlean Patten, M.D.    03/29/2012 Acute OV  Complains of sneezing, runny nose with clear drainage, watery eyes, increased saliva x 6 months .  Has multiple complaints since surgery last year for pacemaker and back surgery.  Has significant drippy nose and post nasal drainage. Mainly clear drainage. Has to clear throat.  Has dry cough on/off  Does gets choked on saliva and thin liquids.  No significant dysphagia at present. Marland Kitchen Has had previous esophageal dilatation.  Has excessive saliva intermittently.  Gets weekly allergy shots.  Started on Dymista -did not use but once because had a headache.  No discolored mucus, fever, chest pain or edema.  >>rec to stop ACE , rx claritin   04/30/2012 Acute OV  Complains of prod cough with yellow mucus, increased SOB x 8 days.  Recently on vacation on cruise.  Patient reports that he started coughing. Prior to beginning his vacation. And cough is progressively gotten worse. He's been taking over-the-counter cold products, and Tylenol without much relief. He denies any hemoptysis, orthopnea, chest pain, palpitations, syncope Patient was recommended to have his ACE inhibitor  Change at last visit. However, patient reports that his doctor does not feel like this is causing his cough and his dose was increased.  05/16/12- 78 year old male with remote smoking history, followed for chronic asthmatic bronchitis/bronchiectasis, history of sinusitis, allergic rhinitis, sleep apnea, AFib/ CAD/ MI, GERD   FOLLOWS FOR: still on vaccine and doing well; no major flare ups at this time. Describes pain in the left mid back intermittent x10 years. His primary physician has thought it was arthritis and referred for physical therapy which starts tomorrow. History of spinal stenosis surgery. He had an acute bronchitis at last office  visit, responding to Augmentin, prednisone and Mucinex DM. He continues allergy vaccine 1:10 GH. CXR 05/01/12 *RADIOLOGY REPORT*  Clinical Data: Cough and shortness of breath.  CHEST - 2 VIEW  Comparison: 01/17/2012  Findings: Stable appearance of the left cardiac ICD leads. The  lungs are clear without airspace disease or edema. Stable  appearance of the heart and mediastinum. Stable densities or  calcifications in the left mid lung region.  IMPRESSION:  No active cardiopulmonary disease.  Original Report Authenticated By: Markus Daft, M.D.  11/19/12- 78 year old male with remote smoking history, followed for chronic asthmatic bronchitis/bronchiectasis, history of sinusitis, allergic rhinitis, OSA/ CPAP/ UPPP, AFib/ CAD/ MI, GERD   FOLLOWS FOR: still on vaccine-has missed 2 weeks due to traveling. DME is [facility]. Wears CPAP every night for about 8-9 hours; pressure working well for patient. Okay with CPAP/Advanced Auto 5-15. Old machine has burned out and needs replacement Continues allergy vaccine 1:10 GH  05/20/13- 78 year old male with remote smoking history, followed for chronic asthmatic bronchitis/bronchiectasis, history of sinusitis, allergic rhinitis, OSA/ CPAP/ UPPP, AFib/ CAD/ MI, GERD   FOLLOWS FOR: Breathing is doing well, no concerns.  Doing well on allergy vaccine 1:10 GH.  Wearing CPAP Auto 5-15/ Advanced 8 hours per night. Old CPAP machine works fine. He got a new machine but never got used to it so he returned to his old one.  11/18/13- 78 year old male with remote smoking history, followed for chronic asthmatic bronchitis/bronchiectasis, history of sinusitis, allergic rhinitis, OSA/ CPAP/ UPPP, complicated  by  AFib/ CAD/ MI, GERD, long hx anxiety/ depression Wife here FOLLOW FOR: OSA; wears CPAP 6CM/ Advanced every night, 10-12 hours nightly.  Not feeling rested.  Neurologist and  PCP both believe that pt is depressed due to inability to sleep well. Mask not fit properly. He is  using old machine- never got his new one adjusted.  Wife says he has early Alzheimers. He lies in bed 10-11 hours, frequently awake after ambien 1/2 x 5 mg.  ROS-see HPI Constitutional:   No-   weight loss, night sweats, fevers, chills, fatigue, lassitude. HEENT:   No-  headaches, difficulty swallowing, tooth/dental problems, sore throat,       No-  sneezing, itching, ear ache, nasal congestion, post nasal drip,  CV:  No-   chest pain, orthopnea, PND, swelling in lower extremities, anasarca,  dizziness, palpitations Resp: No-   shortness of breath with exertion or at rest.              No-   productive cough,  No non-productive cough,  No- coughing up of blood.              No-   change in color of mucus.  No- wheezing.   Skin: No-   rash or lesions. GI:  No-   heartburn, indigestion, abdominal pain, nausea, vomiting, GU:  MS:  No-   joint pain or swelling.  .  + back pain. Neuro-     nothing unusual Psych:  No- change in mood or affect. +depression or anxiety.  No memory loss.  OBJ- Physical Exam General- Alert, Oriented, Affect-appropriate, Distress- none acute Skin- rash-none, lesions- none, excoriation- none.  Lymphadenopathy- none Head- atraumatic            Eyes- Gross vision intact, PERRLA, conjunctivae and secretions clear            Ears- Hearing, canals-normal            Nose- Clear, no-Septal dev, mucus, polyps, erosion, perforation             Throat- + UPPP/ Mallampati II ,                         tonsils- atrophic Neck- flexible , trachea midline, no stridor , thyroid nl, carotid no bruit Chest - symmetrical excursion , unlabored           Heart/CV- RRR , no murmur , no gallop  , no rub, nl s1 s2                           - JVD- none , edema- none, stasis changes- none, varices- none           Lung- clear, wheeze- none, cough- none , dullness-none, rub- none           Chest wall- L pacemaker Abd- Br/ Gen/ Rectal- Not done, not indicated Extrem- cyanosis- none,  clubbing, none, atrophy- none, strength- nl Neuro- grossly intact to observation

## 2013-11-18 NOTE — Patient Instructions (Signed)
Order- Advanced- change CPAP to auto 4-10 cwp, mask of choice, humidifier, supplies.        Order- Advanced- download CPAP in 1 week for pressure compliance recommendation     Dx OSA  Flu vax

## 2013-11-20 ENCOUNTER — Other Ambulatory Visit: Payer: Self-pay | Admitting: Internal Medicine

## 2013-11-25 ENCOUNTER — Ambulatory Visit (INDEPENDENT_AMBULATORY_CARE_PROVIDER_SITE_OTHER): Payer: Medicare Other | Admitting: *Deleted

## 2013-11-25 DIAGNOSIS — I480 Paroxysmal atrial fibrillation: Secondary | ICD-10-CM | POA: Diagnosis not present

## 2013-11-25 DIAGNOSIS — Z7901 Long term (current) use of anticoagulants: Secondary | ICD-10-CM | POA: Diagnosis not present

## 2013-11-25 DIAGNOSIS — Z5181 Encounter for therapeutic drug level monitoring: Secondary | ICD-10-CM | POA: Diagnosis not present

## 2013-11-25 DIAGNOSIS — I4891 Unspecified atrial fibrillation: Secondary | ICD-10-CM | POA: Diagnosis not present

## 2013-11-25 LAB — POCT INR: INR: 2.4

## 2013-11-26 ENCOUNTER — Other Ambulatory Visit: Payer: Self-pay | Admitting: Internal Medicine

## 2013-11-28 ENCOUNTER — Ambulatory Visit (INDEPENDENT_AMBULATORY_CARE_PROVIDER_SITE_OTHER): Payer: Medicare Other

## 2013-11-28 DIAGNOSIS — J309 Allergic rhinitis, unspecified: Secondary | ICD-10-CM

## 2013-11-29 ENCOUNTER — Encounter: Payer: Self-pay | Admitting: Internal Medicine

## 2013-12-02 DIAGNOSIS — L570 Actinic keratosis: Secondary | ICD-10-CM | POA: Diagnosis not present

## 2013-12-02 DIAGNOSIS — Z85828 Personal history of other malignant neoplasm of skin: Secondary | ICD-10-CM | POA: Diagnosis not present

## 2013-12-02 DIAGNOSIS — D1801 Hemangioma of skin and subcutaneous tissue: Secondary | ICD-10-CM | POA: Diagnosis not present

## 2013-12-02 DIAGNOSIS — L821 Other seborrheic keratosis: Secondary | ICD-10-CM | POA: Diagnosis not present

## 2013-12-04 ENCOUNTER — Encounter: Payer: Self-pay | Admitting: Internal Medicine

## 2013-12-04 ENCOUNTER — Telehealth: Payer: Self-pay | Admitting: Internal Medicine

## 2013-12-04 ENCOUNTER — Ambulatory Visit (INDEPENDENT_AMBULATORY_CARE_PROVIDER_SITE_OTHER): Payer: Medicare Other | Admitting: Internal Medicine

## 2013-12-04 VITALS — BP 126/70 | HR 76 | Resp 16 | Ht 73.0 in | Wt 192.0 lb

## 2013-12-04 DIAGNOSIS — I2510 Atherosclerotic heart disease of native coronary artery without angina pectoris: Secondary | ICD-10-CM | POA: Diagnosis not present

## 2013-12-04 DIAGNOSIS — D519 Vitamin B12 deficiency anemia, unspecified: Secondary | ICD-10-CM | POA: Diagnosis not present

## 2013-12-04 DIAGNOSIS — I10 Essential (primary) hypertension: Secondary | ICD-10-CM

## 2013-12-04 DIAGNOSIS — G4733 Obstructive sleep apnea (adult) (pediatric): Secondary | ICD-10-CM

## 2013-12-04 NOTE — Progress Notes (Signed)
   Subjective:    Patient ID: Corey [name], male    DOB: 11/09/1935, 78 y.o.   MRN: [id number]  Hypertension This is a chronic problem. The current episode started more than 1 year ago. The problem has been gradually improving since onset. The problem is controlled. Associated symptoms include anxiety. Pertinent negatives include no blurred vision, chest pain, headaches, malaise/fatigue, neck pain, orthopnea, palpitations, peripheral edema, PND, shortness of breath or sweats. Past treatments include beta blockers and diuretics. The current treatment provides significant improvement. There are no compliance problems.  Hypertensive end-organ damage includes heart failure and left ventricular hypertrophy.      Review of Systems  Constitutional: Negative.  Negative for fever, chills, malaise/fatigue, diaphoresis, appetite change and fatigue.  HENT: Negative.   Eyes: Negative.  Negative for blurred vision.  Respiratory: Negative.  Negative for cough, choking, chest tightness, shortness of breath and stridor.   Cardiovascular: Negative.  Negative for chest pain, palpitations, orthopnea, leg swelling and PND.  Gastrointestinal: Negative.  Negative for nausea, vomiting, abdominal pain, diarrhea, constipation and blood in stool.  Endocrine: Negative.   Genitourinary: Negative.   Musculoskeletal: Negative.  Negative for neck pain.  Skin: Negative.  Negative for rash.  Allergic/Immunologic: Negative.   Neurological: Negative.  Negative for headaches.  Hematological: Negative.  Negative for adenopathy. Does not bruise/bleed easily.  Psychiatric/Behavioral: Negative.        Objective:   Physical Exam  Constitutional: He is oriented to person, place, and time. He appears well-developed and well-nourished. No distress.  HENT:  Head: Normocephalic and atraumatic.  Mouth/Throat: Oropharynx is clear and moist. No oropharyngeal exudate.  Eyes: Conjunctivae are normal. Right eye exhibits no  discharge. Left eye exhibits no discharge. No scleral icterus.  Neck: Normal range of motion. Neck supple. No JVD present. No tracheal deviation present. No thyromegaly present.  Cardiovascular: Normal rate, regular rhythm, normal heart sounds and intact distal pulses.  Exam reveals no gallop and no friction rub.   No murmur heard. Pulmonary/Chest: Effort normal and breath sounds normal. No stridor. No respiratory distress. He has no wheezes. He has no rales. He exhibits no tenderness.  Abdominal: Soft. Bowel sounds are normal. He exhibits no distension and no mass. There is no tenderness. There is no rebound and no guarding.  Musculoskeletal: Normal range of motion. He exhibits no edema or tenderness.  Lymphadenopathy:    He has no cervical adenopathy.  Neurological: He is oriented to person, place, and time.  Skin: Skin is warm and dry. No rash noted. He is not diaphoretic. No erythema. No pallor.  Vitals reviewed.    Lab Results  Component Value Date   WBC 3.9* 11/13/2013   HGB 12.6* 11/13/2013   HCT 38.4* 11/13/2013   PLT 219.0 11/13/2013   GLUCOSE 84 11/13/2013   CHOL 161 11/13/2013   TRIG 129.0 11/13/2013   HDL 55.50 11/13/2013   LDLCALC 80 11/13/2013   ALT 31 11/13/2013   AST 43* 11/13/2013   NA 140 11/13/2013   K 4.6 11/13/2013   CL 104 11/13/2013   CREATININE 1.3 11/13/2013   BUN 19 11/13/2013   CO2 33* 11/13/2013   TSH 2.12 11/13/2013   PSA 0.02* 11/13/2013   INR 2.4 11/25/2013   HGBA1C 6.0 11/13/2013       Assessment & Plan:

## 2013-12-04 NOTE — Assessment & Plan Note (Signed)
He agrees to start the B12 and folate supplements

## 2013-12-04 NOTE — Patient Instructions (Signed)

## 2013-12-04 NOTE — Telephone Encounter (Signed)
Sent staff message to [facility] to help with clarifying what the change was and if anything we need to do.

## 2013-12-04 NOTE — Progress Notes (Signed)
Pre visit review using our clinic review tool, if applicable. No additional management support is needed unless otherwise documented below in the visit note. 

## 2013-12-04 NOTE — Assessment & Plan Note (Signed)
His BP is well controlled 

## 2013-12-05 ENCOUNTER — Ambulatory Visit (INDEPENDENT_AMBULATORY_CARE_PROVIDER_SITE_OTHER): Payer: Medicare Other

## 2013-12-05 DIAGNOSIS — J309 Allergic rhinitis, unspecified: Secondary | ICD-10-CM | POA: Diagnosis not present

## 2013-12-05 NOTE — Telephone Encounter (Signed)
Melissa called back. She reports Dr. [name] had ordered for pt to be set on auto setting. Pt spoke with Jonni Sanger and just complained and stated he was not going to use the machine bc he could not tolerate the auto setting. Pt was going to go back to his old machine if pressure not changed. Jonni Sanger pulled records and pt was set on 7cm previously and did fine at this setting. Dr. [name] had tried pt on CPAP setting of 9 CM and was not able to tolerate that setting. Andy set pt machine yesterday to 7 cm and wants to make sure Dr. [name] is fine with this. Please advise thanks

## 2013-12-05 NOTE — Telephone Encounter (Signed)
Spoke with Lenna Sciara and is aware. Also order placed. Nothing further needed

## 2013-12-05 NOTE — Telephone Encounter (Signed)
Called and LM for [facility]  Per [place], she has not received a call back or message back from [place].

## 2013-12-05 NOTE — Telephone Encounter (Signed)
Ok - "Thanks to Walker" and order Advanced set CPAP at 7 cwp just for documentation  Dx OSA

## 2013-12-07 ENCOUNTER — Other Ambulatory Visit: Payer: Self-pay | Admitting: Internal Medicine

## 2013-12-12 ENCOUNTER — Telehealth: Payer: Self-pay | Admitting: Internal Medicine

## 2013-12-12 ENCOUNTER — Ambulatory Visit (INDEPENDENT_AMBULATORY_CARE_PROVIDER_SITE_OTHER): Payer: Medicare Other

## 2013-12-12 DIAGNOSIS — Z79899 Other long term (current) drug therapy: Secondary | ICD-10-CM

## 2013-12-12 DIAGNOSIS — J309 Allergic rhinitis, unspecified: Secondary | ICD-10-CM

## 2013-12-12 DIAGNOSIS — R109 Unspecified abdominal pain: Secondary | ICD-10-CM

## 2013-12-12 NOTE — Telephone Encounter (Signed)
Pt came by office stating that he is still having continuous flank pain since last visit. Pt states it has not been better. Pt would like to request to have MRI done. Please contact pt when request is reviewed.   FYI: Pt states he will not be home next week for the holiday.

## 2013-12-12 NOTE — Telephone Encounter (Signed)
CT ordered. 

## 2013-12-13 ENCOUNTER — Ambulatory Visit: Payer: Medicare Other

## 2013-12-16 ENCOUNTER — Encounter: Payer: Self-pay | Admitting: Internal Medicine

## 2013-12-20 ENCOUNTER — Ambulatory Visit: Payer: Medicare Other | Admitting: Internal Medicine

## 2013-12-23 ENCOUNTER — Other Ambulatory Visit: Payer: Self-pay | Admitting: Neurology

## 2013-12-23 NOTE — Telephone Encounter (Signed)
Rx sent in

## 2013-12-24 ENCOUNTER — Ambulatory Visit (INDEPENDENT_AMBULATORY_CARE_PROVIDER_SITE_OTHER): Payer: Medicare Other | Admitting: *Deleted

## 2013-12-24 ENCOUNTER — Telehealth: Payer: Self-pay | Admitting: Internal Medicine

## 2013-12-24 DIAGNOSIS — I5022 Chronic systolic (congestive) heart failure: Secondary | ICD-10-CM

## 2013-12-24 DIAGNOSIS — Z95810 Presence of automatic (implantable) cardiac defibrillator: Secondary | ICD-10-CM

## 2013-12-25 ENCOUNTER — Other Ambulatory Visit (INDEPENDENT_AMBULATORY_CARE_PROVIDER_SITE_OTHER): Payer: Medicare Other

## 2013-12-25 DIAGNOSIS — L570 Actinic keratosis: Secondary | ICD-10-CM | POA: Diagnosis not present

## 2013-12-25 DIAGNOSIS — Z85828 Personal history of other malignant neoplasm of skin: Secondary | ICD-10-CM | POA: Diagnosis not present

## 2013-12-25 DIAGNOSIS — D224 Melanocytic nevi of scalp and neck: Secondary | ICD-10-CM | POA: Diagnosis not present

## 2013-12-25 DIAGNOSIS — L821 Other seborrheic keratosis: Secondary | ICD-10-CM | POA: Diagnosis not present

## 2013-12-25 DIAGNOSIS — Z79899 Other long term (current) drug therapy: Secondary | ICD-10-CM

## 2013-12-25 LAB — CREATININE, SERUM: CREATININE: 1.2 mg/dL (ref 0.4–1.5)

## 2013-12-25 LAB — BUN: BUN: 17 mg/dL (ref 6–23)

## 2013-12-25 NOTE — Telephone Encounter (Signed)
Entered lab for BUN & Creatinine...Corey [name]

## 2013-12-25 NOTE — Telephone Encounter (Signed)
Patient is having CT tomorrow, 12/26/13. He needs a current BUN and creatinine. Can you place orders please?

## 2013-12-26 ENCOUNTER — Encounter: Payer: Self-pay | Admitting: Internal Medicine

## 2013-12-26 ENCOUNTER — Other Ambulatory Visit: Payer: Self-pay | Admitting: Internal Medicine

## 2013-12-26 ENCOUNTER — Ambulatory Visit (INDEPENDENT_AMBULATORY_CARE_PROVIDER_SITE_OTHER): Payer: Medicare Other

## 2013-12-26 ENCOUNTER — Ambulatory Visit
Admission: RE | Admit: 2013-12-26 | Discharge: 2013-12-26 | Disposition: A | Discharge status: Home / Self Care | Payer: Medicare Other | Source: Ambulatory Visit | Attending: Internal Medicine | Admitting: Internal Medicine

## 2013-12-26 ENCOUNTER — Encounter: Payer: Self-pay | Admitting: *Deleted

## 2013-12-26 ENCOUNTER — Ambulatory Visit (INDEPENDENT_AMBULATORY_CARE_PROVIDER_SITE_OTHER)
Admission: RE | Admit: 2013-12-26 | Discharge: 2013-12-26 | Disposition: A | Discharge status: Home / Self Care | Payer: Medicare Other | Source: Ambulatory Visit | Attending: Internal Medicine | Admitting: Internal Medicine

## 2013-12-26 DIAGNOSIS — R1010 Upper abdominal pain, unspecified: Secondary | ICD-10-CM | POA: Diagnosis not present

## 2013-12-26 DIAGNOSIS — J309 Allergic rhinitis, unspecified: Secondary | ICD-10-CM

## 2013-12-26 DIAGNOSIS — Z9889 Other specified postprocedural states: Secondary | ICD-10-CM | POA: Diagnosis not present

## 2013-12-26 DIAGNOSIS — K5730 Diverticulosis of large intestine without perforation or abscess without bleeding: Secondary | ICD-10-CM | POA: Diagnosis not present

## 2013-12-26 DIAGNOSIS — R109 Unspecified abdominal pain: Secondary | ICD-10-CM

## 2013-12-26 DIAGNOSIS — N2889 Other specified disorders of kidney and ureter: Secondary | ICD-10-CM | POA: Diagnosis not present

## 2013-12-26 DIAGNOSIS — N200 Calculus of kidney: Secondary | ICD-10-CM | POA: Diagnosis not present

## 2013-12-26 DIAGNOSIS — K3189 Other diseases of stomach and duodenum: Secondary | ICD-10-CM | POA: Insufficient documentation

## 2013-12-26 MED ORDER — IOHEXOL 350 MG/ML SOLN: 100.0000 mL | Freq: Once | INTRAVENOUS | Status: AC | PRN

## 2013-12-26 MED ORDER — IOHEXOL 300 MG/ML  SOLN
100.0000 mL | Freq: Once | INTRAMUSCULAR | Status: AC | PRN
  Administered 2013-12-26: 100 mL via INTRAVENOUS

## 2013-12-26 NOTE — Progress Notes (Signed)
EPIC Encounter for ICM Monitoring  Patient Name: Corey [name] is a 78 y.o. male Date: 12/26/2013 Primary Care Physican: Scarlette Calico, MD Primary Cardiologist: Allred Electrophysiologist: Allred Dry Weight: 196 lbs  Bi-V pacing: 99%       In the past month, have you:  1. Gained more than 2 pounds in a day or more than 5 pounds in a week? No. The patient had previously stated that his baseline weight was ~186 lbs. He reports today that his weight has steadily been going up and he is now 196 lbs. In reviewing his chart, he was 180 lbs (10/05/13) and 191.4 lbs (12/04/13).   2. Had changes in your medications (with verification of current medications)? no  3. Had more shortness of breath than is usual for you? no  4. Limited your activity because of shortness of breath? no  5. Not been able to sleep because of shortness of breath? no  6. Had increased swelling in your feet or ankles? no  7. Had symptoms of dehydration (dizziness, dry mouth, increased thirst, decreased urine output) no  8. Had changes in sodium restriction? no  9. Been compliant with medication? Yes   ICM trend:   Follow-up plan: ICM clinic phone appointment: 02/03/13. The patient has follow up scheduled with Dr. Rayann Heman on 12/30/13. His corvue readings were slightly elevated from ~ 11/13-11/22. He did not recall any changes to his diet/ oral intake at that time, nor does he recall having any symptoms. No changes made today.  Copy of note sent to patient's primary care physician, primary cardiologist, and device following physician.  Alvis Lemmings, RN, BSN 12/26/2013 3:09 PM

## 2013-12-30 ENCOUNTER — Encounter: Payer: Self-pay | Admitting: Internal Medicine

## 2013-12-30 ENCOUNTER — Ambulatory Visit (INDEPENDENT_AMBULATORY_CARE_PROVIDER_SITE_OTHER): Payer: Medicare Other | Admitting: Internal Medicine

## 2013-12-30 VITALS — BP 130/80 | HR 65 | Ht 73.0 in | Wt 202.0 lb

## 2013-12-30 DIAGNOSIS — I10 Essential (primary) hypertension: Secondary | ICD-10-CM

## 2013-12-30 DIAGNOSIS — G4733 Obstructive sleep apnea (adult) (pediatric): Secondary | ICD-10-CM | POA: Diagnosis not present

## 2013-12-30 DIAGNOSIS — I2510 Atherosclerotic heart disease of native coronary artery without angina pectoris: Secondary | ICD-10-CM

## 2013-12-30 DIAGNOSIS — I480 Paroxysmal atrial fibrillation: Secondary | ICD-10-CM | POA: Diagnosis not present

## 2013-12-30 DIAGNOSIS — K319 Disease of stomach and duodenum, unspecified: Secondary | ICD-10-CM | POA: Diagnosis not present

## 2013-12-30 DIAGNOSIS — K3189 Other diseases of stomach and duodenum: Secondary | ICD-10-CM

## 2013-12-30 DIAGNOSIS — I5022 Chronic systolic (congestive) heart failure: Secondary | ICD-10-CM | POA: Diagnosis not present

## 2013-12-30 LAB — MDC_IDC_ENUM_SESS_TYPE_INCLINIC
Battery Remaining Longevity: 57.6 mo
Brady Statistic RA Percent Paced: 99.32 %
Brady Statistic RV Percent Paced: 99 %
HighPow Impedance: 69 Ohm
Implantable Pulse Generator Model: 3265
Implantable Pulse Generator Serial Number: 7057550
Lead Channel Impedance Value: 1087.5 Ohm
Lead Channel Impedance Value: 412.5 Ohm
Lead Channel Pacing Threshold Amplitude: 0.75 V
Lead Channel Pacing Threshold Amplitude: 0.75 V
Lead Channel Pacing Threshold Amplitude: 1 V
Lead Channel Pacing Threshold Amplitude: 1.25 V
Lead Channel Pacing Threshold Amplitude: 1.25 V
Lead Channel Pacing Threshold Pulse Width: 0.5 ms
Lead Channel Pacing Threshold Pulse Width: 0.5 ms
Lead Channel Pacing Threshold Pulse Width: 0.5 ms
Lead Channel Pacing Threshold Pulse Width: 0.5 ms
Lead Channel Sensing Intrinsic Amplitude: 10.3 mV
Lead Channel Sensing Intrinsic Amplitude: 2.2 mV
Lead Channel Setting Pacing Amplitude: 2 V
Lead Channel Setting Pacing Amplitude: 2 V
Lead Channel Setting Pacing Pulse Width: 0.5 ms
MDC IDC MSMT LEADCHNL LV PACING THRESHOLD AMPLITUDE: 1 V
MDC IDC MSMT LEADCHNL RA IMPEDANCE VALUE: 525 Ohm
MDC IDC MSMT LEADCHNL RA PACING THRESHOLD PULSEWIDTH: 0.5 ms
MDC IDC MSMT LEADCHNL RV PACING THRESHOLD PULSEWIDTH: 0.5 ms
MDC IDC SESS DTM: 20151207150910
MDC IDC SET LEADCHNL LV PACING PULSEWIDTH: 0.5 ms
MDC IDC SET LEADCHNL RV PACING AMPLITUDE: 2.5 V
MDC IDC SET LEADCHNL RV SENSING SENSITIVITY: 0.5 mV
MDC IDC SET ZONE DETECTION INTERVAL: 270 ms
Zone Setting Detection Interval: 340 ms

## 2013-12-30 NOTE — Patient Instructions (Signed)
Your physician recommends that you schedule a follow-up appointment in: 4 months with Dr. Rayann Heman  Remote monitoring is used to monitor your Pacemaker or ICD from home. This monitoring reduces the number of office visits required to check your device to one time per year. It allows [place] to keep an eye on the functioning of your device to ensure it is working properly. You are scheduled for a device check from home on 03/31/14. You may send your transmission at any time that day. If you have a wireless device, the transmission will be sent automatically. After your physician reviews your transmission, you will receive a postcard with your next transmission date.

## 2013-12-30 NOTE — Progress Notes (Signed)
PCP: previously Dr Ronnald Ramp Primary Cardiologist:   Previously Dr Camelia Eng Corey [name] is a 78 y.o. male who presents today for cardiology/ electrophysiology followup. He seems to have better spirits than when I saw him last.  He has gained over 10 lbs and attributes this to "eating".  He does not feel that he has developed worsening symptoms of CHF.  He has established with Dr Ronnald Ramp and recently had a CT scan to evaluate flank pain.  He has frequent and chronic constipation and nagging abdominal pain.  He has been followed by Dr Olevia Perches and has an appointment with her in January.   Today, he denies symptoms of chest pain, shortness of breath, lower extremity edema, dizziness, presyncope, syncope, or ICD shocks.  The patient is otherwise without complaint today.   Past Medical History  Diagnosis Date  . Alcohol abuse, episodic 05/19/2008  . ALLERGIC RHINITIS 04/15/2009  . COLONIC POLYPS, ADENOMATOUS, HX OF 02/14/2008  . DIVERTICULOSIS, COLON 02/14/2008  . ESOPHAGEAL STRICTURE 01/28/2008  . GERD 02/14/2008  . HYPERGLYCEMIA 11/16/2006  . HYPERLIPIDEMIA 02/14/2008  . INSOMNIA-SLEEP DISORDER-UNSPEC 02/11/2009  . 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 PROSTATE CANCER, HX OF 02/25/2000  . TRANSIENT ISCHEMIC ATTACKS, HX OF 11/16/2006  . Arthritis   . Atrial fibrillation 11/16/2006    remote CHF related to atrial fib with rapid ventricular response over 25 yrs per office note,  . ASTHMA 11/16/2006    sinusitis-    hx ?yeast patch/white patch on vocal cord as per [facility] 02/25/11-   . OBSTRUCTIVE SLEEP APNEA 11/10/2008  . SLEEP APNEA 10/03/2009    LOV Dr [name] 12/12 in EPIC    Moderate per patient- settings ?6/last sleep study years ago  . HYPERTENSION 11/16/2006  . Symptomatic bradycardia, secondary to sinus node dysfunction 07/08/2011    s/p [facility] Scientific PPM by Dr Sallyanne Kuster, upgraded to [place] ICD 12/13 by Dr Rayann Heman (SJM)  . Myocardial infarction 1993  . Pacemaker     St. Jude  . ICD (implantable  cardiac defibrillator) in place   . Anxiety   . Depression   . Pneumonia     hx of  . Headache(784.0)   . Cancer 2002    prostate cancer  . CORONARY ARTERY DISEASE 11/16/2006    LHC (07/2011): LAD with luminal irregularities, proximal circumflex 50%, EF 25%  . Chronic systolic CHF (congestive heart failure)     echo (10/02/12): EF 45-36%, grade 1 diastolic dysfunction, trivial AI, mild MR, mild RVE  . NICM (nonischemic cardiomyopathy)   . Stroke     Tia  . Lesion of vocal cord     bx begine   Past Surgical History  Procedure Laterality Date  . Tonsilectomy, adenoidectomy, bilateral myringotomy and tubes    . Knee arthroscopy      2 surgeries right and 1 left  . Prostate surgery  2/02    prostate cancer  . Uvulopalatopharyngoplasty  2013  . Esophagogastroduodenoscopy      with dilitation  . Lumbar laminectomy/decompression microdiscectomy  03/02/2011    Procedure: LUMBAR LAMINECTOMY/DECOMPRESSION MICRODISCECTOMY;  Surgeon: [name], MD;  Location: [facility];  Service: Orthopedics;  Laterality: N/A;  Decompression Lumbar 4 - Lumbar(X-Ray)  . Pacemaker insertion  06/2011    [employer] PPM implanted by Dr [employer]  . Cardiac defibrillator placement  01/16/12    Upgrade to a biventricular SJM ICD by DR Kharson Rasmusson  . Tonsillectomy  as child  . Cardiac catheterization  several  . Back surgery    . Insert / replace / remove pacemaker    . Total knee arthroplasty Left 06/04/2012    Procedure: TOTAL KNEE ARTHROPLASTY;  Surgeon: [name], MD;  Location: [facility];  Service: Orthopedics;  Laterality: Left;  . Colonoscopy    . Vocal cord lesion bx      Current Outpatient Prescriptions  Medication Sig Dispense Refill  . albuterol (PROVENTIL HFA;VENTOLIN HFA) 108 (90 BASE) MCG/ACT inhaler Inhale 2 puffs into the lungs every 6 (six) hours as needed for shortness of breath.     Marland Kitchen amiodarone (PACERONE) 200 MG tablet TAKE ONE-HALF TABLET DAILY (Patient taking differently: TAKE  ONE-HALF TABLET BY MOUTH DAILY) 45 tablet 0  . clonazePAM (KLONOPIN) 0.5 MG tablet 1/2-2 tabs for sleep as needed (Patient taking differently: Take 1/2-2 tablets by mouth at bedtime as needed for sleep) 30 tablet 1  . CRESTOR 5 MG tablet TAKE ONE TABLET IN THE EVENING (Patient taking differently: TAKE ONE TABLET BY MOUTH IN THE EVENING) 30 tablet 11  . folic acid (FOLVITE) 1 MG tablet Take 1 tablet (1 mg total) by mouth daily. 90 tablet 3  . metoprolol succinate (TOPROL-XL) 50 MG 24 hr tablet Take half a tablet at night. (Patient taking differently: Take half a tablet by mouth at night.) 30 tablet 3  . pantoprazole (PROTONIX) 40 MG tablet TAKE ONE TABLET BY MOUTH ONCE DAILY 30 tablet 3  . ramipril (ALTACE) 10 MG capsule TAKE TWO CAPSULES EVERY DAY (Patient taking differently: TAKE TWO CAPSULES BY MOUTH EVERY DAY) 60 capsule 6  . rivastigmine (EXELON) 1.5 MG capsule TAKE ONE CAPSULE TWICE A DAY (Patient taking differently: TAKE ONE CAPSULE BY MOUTH TWICE A DAY) 60 capsule 2  . spironolactone (ALDACTONE) 25 MG tablet TAKE ONE TABLET EACH DAY (Patient taking differently: TAKE ONE TABLET BY MOUTH EACH DAY) 30 tablet 0  . warfarin (COUMADIN) 2.5 MG tablet TAKE AS DIRECTED BY COUMADIN CLINIC 30 tablet 2  . acetaminophen (TYLENOL) 500 MG tablet Take 500 mg by mouth 2 (two) times daily.     . mometasone (NASONEX) 50 MCG/ACT nasal spray TWO SPRAYS IN EACH NOSTRIL DAILY AS NEEDED FOR ALLERGIES    . polyethylene glycol powder (GLYCOLAX/MIRALAX) powder Take 17 g by mouth daily as needed (constipation).      No current facility-administered medications for this visit.    Physical Exam: Filed Vitals:   12/30/13 0955  BP: 130/80  Pulse: 65  Height: 6\' 1"  (1.854 m)  Weight: 202 lb (91.627 kg)    GEN- The patient is well appearing, alert and oriented x 3 today.   Head- normocephalic, atraumatic Eyes-  Sclera clear, conjunctiva pink Ears- hearing intact Oropharynx- clear Lungs- Clear to ausculation  bilaterally, normal work of breathing Chest- ICD pocket is well healed Heart- Regular rate and rhythm, no murmurs, rubs or gallops, PMI not laterally displaced GI- soft, NT, ND, + BS Extremities- no clubbing, cyanosis, or edema  ICD interrogation- reviewed in detail today,  See PACEART report  Assessment and Plan:  1.  Chronic systolic dysfunction euvolemic today Stable on an appropriate medical regimen Normal BiV ICD function Followed in the ICM device clinic See Pace Art report No changes today EF has improved to 35-40% with CRT The importance of ETOH reduction and weight loss were discussed today.  2. ETOH Cessation advised He is not ready to quit  3. HTN Stable No change required today  4. AFib CHADS2VASC score is at  least 6.  I would therefore favor lifelong anticoagulation Maintaining sinus rhythm with amiodarone 100mg  daily.  Consider reducing this further in the future.  5. Abd /flank pain- this is a new issues CT results discussed with patient by me today.  He is aware that Dr Ronnald Ramp office with call him.  Today, we discussed the importance of follow-up with Dr Olevia Perches on stomach lesion and colonic thickening.  He is also aware to follow-up with Dr Ronnald Ramp for further management based on CT results.  Merlin Return to see me in 4 months

## 2014-01-02 ENCOUNTER — Ambulatory Visit (INDEPENDENT_AMBULATORY_CARE_PROVIDER_SITE_OTHER): Payer: Medicare Other

## 2014-01-02 ENCOUNTER — Encounter (HOSPITAL_COMMUNITY): Payer: Self-pay | Admitting: Cardiovascular Disease

## 2014-01-02 DIAGNOSIS — J309 Allergic rhinitis, unspecified: Secondary | ICD-10-CM

## 2014-01-08 ENCOUNTER — Telehealth: Payer: Self-pay | Admitting: *Deleted

## 2014-01-08 ENCOUNTER — Encounter: Payer: Self-pay | Admitting: Physician Assistant

## 2014-01-08 ENCOUNTER — Ambulatory Visit (INDEPENDENT_AMBULATORY_CARE_PROVIDER_SITE_OTHER): Payer: Medicare Other | Admitting: Physician Assistant

## 2014-01-08 VITALS — BP 118/70 | HR 76 | Ht 73.0 in | Wt 202.0 lb

## 2014-01-08 DIAGNOSIS — K3189 Other diseases of stomach and duodenum: Secondary | ICD-10-CM | POA: Diagnosis not present

## 2014-01-08 DIAGNOSIS — I2510 Atherosclerotic heart disease of native coronary artery without angina pectoris: Secondary | ICD-10-CM

## 2014-01-08 DIAGNOSIS — K5732 Diverticulitis of large intestine without perforation or abscess without bleeding: Secondary | ICD-10-CM

## 2014-01-08 MED ORDER — PEG-KCL-NACL-NASULF-NA ASC-C 100 G PO SOLR: 1.0000 | Freq: Once | ORAL | Status: DC

## 2014-01-08 NOTE — Telephone Encounter (Signed)
Patient aware of change and was told not to pick up his Moviprep kit.  Will mail out new instructions today

## 2014-01-08 NOTE — Telephone Encounter (Signed)
Will forward to Corinda Gubler D to assist with anticoagulation recommendation per our bridging protocol.

## 2014-01-08 NOTE — Telephone Encounter (Signed)
  01/08/2014   RE: Corey [name] DOB: 1935/10/13 MRN: [id number]   Dear Dr Rayann Heman,   We have scheduled the above patient for an endoscopic procedure. Our records show that he is on anticoagulation therapy.   Please advise as to how long the patient may come off his therapy of coumadin prior to the procedure, which is scheduled for 01/22/2014.  Please fax back/ or route the completed form to [place] at [phone].   Sincerely,    Genella Mech

## 2014-01-08 NOTE — Progress Notes (Signed)
Patient ID: Corey [name], male   DOB: 01/21/1936, 78 y.o.   MRN: [id number]     History of Present Illness: Corey [name] a 78 year old male who was evaluated by his [facility] care physician, Dr. Ronnald Ramp, in late October with right sided flank pain. His discomfort was E [place] and did not radiate. It had a severity of a 2 out of 10 and seen to be worse during the day. His symptoms were aggravated by bending and changing position. He was sent for a CT scan of the abdomen and pelvis and was found to have a pedunculated a visibly enhancing lesion measuring approximately 1.5 x 1.1 x 1.0 cm in the cardia of the stomach along the superior wall. He was also noted to have extensive thickening of the wall of the distal sigmoid colon and proximal rectum. He was advised to follow-up with GI for further evaluation.  He has a remote smoking history, chronic asthmatic bronchitis/bronchiectasis, sinusitis, allergic rhinitis, sleep apnea, diverticulosis, esophageal strictures, GERD, hyperglycemia, hyperlipidemia, history of prostate cancer, history of TIAs, history of A. fib, obstructive sleep apnea, hypertension, coronary artery disease, chronic systolic CHF.   Past Medical History  Diagnosis Date  . Alcohol abuse, episodic 05/19/2008  . ALLERGIC RHINITIS 04/15/2009  . COLONIC POLYPS, ADENOMATOUS, HX OF 02/14/2008  . DIVERTICULOSIS, COLON 02/14/2008  . ESOPHAGEAL STRICTURE 01/28/2008  . GERD 02/14/2008  . HYPERGLYCEMIA 11/16/2006  . HYPERLIPIDEMIA 02/14/2008  . INSOMNIA-SLEEP DISORDER-UNSPEC 02/11/2009  . 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 PROSTATE CANCER, HX OF 02/25/2000  . TRANSIENT ISCHEMIC ATTACKS, HX OF 11/16/2006  . Arthritis   . Atrial fibrillation 11/16/2006    remote CHF related to atrial fib with rapid ventricular response over 25 yrs per office note,  . ASTHMA 11/16/2006    sinusitis-    hx ?yeast patch/white patch on vocal cord as per [facility] 02/25/11-   . OBSTRUCTIVE SLEEP APNEA 11/10/2008  . SLEEP APNEA  10/03/2009    LOV Dr [name] 12/12 in EPIC    Moderate per patient- settings ?6/last sleep study years ago  . HYPERTENSION 11/16/2006  . Symptomatic bradycardia, secondary to sinus node dysfunction 07/08/2011    s/p [facility] Scientific PPM by Dr Sallyanne Kuster, upgraded to [place] ICD 12/13 by Dr Rayann Heman (SJM)  . Myocardial infarction 1993  . Pacemaker     St. Jude  . ICD (implantable cardiac defibrillator) in place   . Anxiety   . Depression   . Pneumonia     hx of  . Headache(784.0)   . Cancer 2002    prostate cancer  . CORONARY ARTERY DISEASE 11/16/2006    LHC (07/2011): LAD with luminal irregularities, proximal circumflex 50%, EF 25%  . Chronic systolic CHF (congestive heart failure)     echo (10/02/12): EF 41-66%, grade 1 diastolic dysfunction, trivial AI, mild MR, mild RVE  . NICM (nonischemic cardiomyopathy)   . Stroke     Tia  . Lesion of vocal cord     bx begine    Past Surgical History  Procedure Laterality Date  . Tonsilectomy, adenoidectomy, bilateral myringotomy and tubes    . Knee arthroscopy      2 surgeries right and 1 left  . Prostate surgery  2/02    prostate cancer  . Uvulopalatopharyngoplasty  2013  . Esophagogastroduodenoscopy      with dilitation  . Lumbar laminectomy/decompression microdiscectomy  03/02/2011    Procedure: LUMBAR LAMINECTOMY/DECOMPRESSION MICRODISCECTOMY;  Surgeon: [name], MD;  Location: [facility];  Service: Orthopedics;  Laterality: N/A;  Decompression Lumbar 4 - Lumbar(X-Ray)  . Pacemaker insertion  06/2011    [employer] PPM implanted by Dr [employer]  . Cardiac defibrillator placement  01/16/12    Upgrade to a biventricular SJM ICD by DR Allred  . Tonsillectomy  as child  . Cardiac catheterization      several  . Back surgery    . Insert / replace / remove pacemaker    . Total knee arthroplasty Left 06/04/2012    Procedure: TOTAL KNEE ARTHROPLASTY;  Surgeon: [name], MD;  Location: [facility];  Service: Orthopedics;  Laterality: Left;    . Colonoscopy    . Vocal cord lesion bx    . Permanent pacemaker insertion Left 07/08/2011    Procedure: PERMANENT PACEMAKER INSERTION;  Surgeon: [name], MD;  Location: [place] CATH LAB;  Service: Cardiovascular;  Laterality: Left;  . Left and right heart catheterization with coronary angiogram N/A 08/18/2011    Procedure: LEFT AND RIGHT HEART CATHETERIZATION WITH CORONARY ANGIOGRAM;  Surgeon: [name], MD;  Location: [place] CATH LAB;  Service: Cardiovascular;  Laterality: N/A;  . Bi-ventricular implantable cardioverter defibrillator upgrade N/A 01/16/2012    Procedure: BI-VENTRICULAR IMPLANTABLE CARDIOVERTER DEFIBRILLATOR UPGRADE;  Surgeon: Thompson Grayer, MD;  Location: [facility] CATH LAB;  Service: Cardiovascular;  Laterality: N/A;   Family History  Problem Relation Age of Onset  . Heart attack Father   . Heart attack Brother   . Heart disease Maternal Uncle   . Heart attack Maternal Uncle   . Colon cancer Neg Hx   . Esophageal cancer Neg Hx   . Rectal cancer Neg Hx   . Prostate cancer Neg Hx   . Stomach cancer Neg Hx    History  Substance Use Topics  . Smoking status: Former Smoker -- 1.00 packs/day for 4 years    Types: Cigarettes    Quit date: 03/22/1964  . Smokeless tobacco: Never Used     Comment: reports smoked socially  . Alcohol Use: 9.0 oz/week    15 Shots of liquor per week     Comment: 2 mixed drinks daily, some days   Current Outpatient Prescriptions  Medication Sig Dispense Refill  . acetaminophen (TYLENOL) 500 MG tablet Take 500 mg by mouth 2 (two) times daily.     Marland Kitchen albuterol (PROVENTIL HFA;VENTOLIN HFA) 108 (90 BASE) MCG/ACT inhaler Inhale 2 puffs into the lungs every 6 (six) hours as needed for shortness of breath.     Marland Kitchen amiodarone (PACERONE) 200 MG tablet TAKE ONE-HALF TABLET DAILY (Patient taking differently: TAKE ONE-HALF TABLET BY MOUTH DAILY) 45 tablet 0  . clonazePAM (KLONOPIN) 0.5 MG tablet 1/2-2 tabs for sleep as needed (Patient taking differently: Take  1/2-2 tablets by mouth at bedtime as needed for sleep) 30 tablet 1  . CRESTOR 5 MG tablet TAKE ONE TABLET IN THE EVENING (Patient taking differently: TAKE ONE TABLET BY MOUTH IN THE EVENING) 30 tablet 11  . folic acid (FOLVITE) 1 MG tablet Take 1 tablet (1 mg total) by mouth daily. 90 tablet 3  . metoprolol succinate (TOPROL-XL) 50 MG 24 hr tablet Take half a tablet at night. (Patient taking differently: Take half a tablet by mouth at night.) 30 tablet 3  . mometasone (NASONEX) 50 MCG/ACT nasal spray TWO SPRAYS IN EACH NOSTRIL DAILY AS NEEDED FOR ALLERGIES    . pantoprazole (PROTONIX) 40 MG tablet TAKE ONE TABLET BY MOUTH ONCE DAILY 30 tablet 3  . polyethylene glycol powder (GLYCOLAX/MIRALAX) powder Take 17  g by mouth daily as needed (constipation).     . ramipril (ALTACE) 10 MG capsule TAKE TWO CAPSULES EVERY DAY (Patient taking differently: TAKE TWO CAPSULES BY MOUTH EVERY DAY) 60 capsule 6  . rivastigmine (EXELON) 1.5 MG capsule TAKE ONE CAPSULE TWICE A DAY (Patient taking differently: TAKE ONE CAPSULE BY MOUTH TWICE A DAY) 60 capsule 2  . spironolactone (ALDACTONE) 25 MG tablet TAKE ONE TABLET EACH DAY (Patient taking differently: TAKE ONE TABLET BY MOUTH EACH DAY) 30 tablet 0  . warfarin (COUMADIN) 2.5 MG tablet TAKE AS DIRECTED BY COUMADIN CLINIC 30 tablet 2  . peg 3350 powder (MOVIPREP) 100 G SOLR Take 1 kit (200 g total) by mouth once. 1 kit 0   No current facility-administered medications for this visit.   Allergies  Allergen Reactions  . Hydrocodone Other (See Comments)    Severe panic attacks  . Oxycodone Other (See Comments)    Severe panic attacks      Review of Systems: Gen: Positive for fatigue CV: Denies chest pain, angina, palpitations, syncope, orthopnea, PND, peripheral edema, and claudication. Resp: Denies dyspnea at rest, dyspnea with exercise, cough, sputum, wheezing, coughing up blood, and pleurisy. GI: Denies vomiting blood, jaundice, and fecal incontinence.    Denies dysphagia or odynophagia. GU : Denies urinary burning, blood in urine, urinary frequency, urinary hesitancy, nocturnal urination, and urinary incontinence. MS: Positive for joint stiffness Derm: Denies rash, itching, dry skin, hives, moles, warts, or unhealing ulcers.  Psych: Denies depression, anxiety, memory loss, suicidal ideation, hallucinations, paranoia, and confusion. Heme: Denies bruising, bleeding, and enlarged lymph nodes. Neuro:  Denies any headaches, dizziness, paresthesia Endo:  Denies any problems with DM, thyroid, adrenal     Studies:   Ct Abdomen Pelvis W Wo Contrast  12/26/2013   CLINICAL DATA:  78 year old male with history of acute right flank pain for the past 2-3 weeks. Evaluate for renal mass or cause of acute pain.  EXAM: CT ABDOMEN AND PELVIS WITHOUT AND WITH CONTRAST  TECHNIQUE: Multidetector CT imaging of the abdomen and pelvis was performed following the standard protocol before and following the bolus administration of intravenous contrast.  CONTRAST:  100 mL of Omnipaque 350.  COMPARISON:  CT of abdomen 03/30/2010. CT of the abdomen and pelvis 05/08/2009.  FINDINGS: Lower chest: Mild scarring in the lung bases bilaterally, similar to prior studies. Atherosclerotic calcifications in the left anterior descending coronary artery. Pacemaker leads in the right atrium, right ventricular apex and coronary veins overlying the lateral wall of the left ventricle via the coronary sinus.  Hepatobiliary: No cystic or solid hepatic lesions. No intra or extrahepatic biliary ductal dilatation. Gallbladder is normal in appearance.  Pancreas: Unremarkable.  Spleen: Numerous calcifications in the spleen, compatible with calcified granulomas.  Adrenals/Urinary Tract: 4 mm nonobstructive calculus in the interpolar collecting system of the left kidney. No additional calculi are noted within the right renal collecting system, along the course of either ureter, or within the lumen of the  urinary bladder. No hydroureteronephrosis. Urinary bladder is normal in appearance. Delayed images are suboptimal and demonstrate little to no opacification of the collecting systems, ureters and urinary bladder. Sub cm low-attenuation lesion in the upper pole of the right kidney is too small to definitively characterize, but is statistically likely a tiny cyst. No other aggressive appearing renal lesions are noted. Bilateral adrenal glands are normal in appearance.  Stomach/Bowel: In the cardia of the stomach along of the superior wall immediately adjacent to the gastroesophageal junction there  is a pedunculated avidly enhancing lesion measuring approximately 1.5 x 1.1 x 1.0 cm (image 50 of coronal series, and image 28 of series 3). No other gastric lesion is identified. No pathologic dilatation of small bowel or colon. Normal appendix. Numerous colonic diverticulae, particularly in the sigmoid colon, without surrounding inflammatory changes to suggest an acute diverticulitis at this time. However, there is extensive thickening of the wall of the distal sigmoid colon and proximal rectum, best appreciated on coronal image 65.  Vascular/Lymphatic: Extensive atherosclerosis throughout the abdominal and pelvic vasculature, without evidence of aneurysm or dissection. Single left renal artery. Two right renal arteries (small accessory artery to the lower pole). No lymphadenopathy noted in the abdomen or pelvis.  Reproductive: Status post radical prostatectomy.  Other: No significant volume of ascites.  No pneumoperitoneum.  Musculoskeletal: There are no aggressive appearing lytic or blastic lesions noted in the visualized portions of the skeleton.  IMPRESSION: 1. No renal mass identified. 2. There is, however, an enhancing pedunculated 1.5 x 1.1 x 1.0 cm lesion arising in the cardia of the stomach, suspicious for a gastric polyp or other small gastric neoplasm. Correlation with upper endoscopy is recommended in the near  future to further evaluate this finding. 3. 4 mm nonobstructive calculus in the interpolar collecting system of the left kidney. No ureteral stones or findings of urinary tract obstruction are noted at this time. 4. Sub cm low-attenuation lesion in the medial aspect of the upper pole of the right kidney is too small to characterize, but is statistically likely a tiny cyst. 5. Colonic diverticulosis, without evidence to suggest acute diverticulitis at this time. At the rectosigmoid junction there is extensive colorectal wall thickening, which is favored to be chronic and postinflammatory, however, correlation with nonemergent colonoscopy in the near future if one has not been recently performed is suggested to exclude the presence of underlying neoplasm. 6. Status post radical prostatectomy. 7. Additional incidental findings, as above. These results will be called to the ordering clinician or representative by the Radiologist Assistant, and communication documented in the PACS or zVision Dashboard.   Electronically Signed   By: Vinnie Langton M.D.   On: 12/26/2013 14:20   PROCEDURES: Colonoscopy 02/05/2013 showed severe diverticulosis noted in the descending colon and sigmoid colon. Small internal hemorrhoids. EGD on 06/24/2009 showed #1 stricture. #2 duodenitis. #3 otherwise normal examination. Status post dilation to 67 [place] with savory dilator.   Physical Exam: General: Pleasant, well developed male in no acute distress Head: Normocephalic and atraumatic Eyes:  sclerae anicteric, conjunctiva pink  Ears: Normal auditory acuity Lungs: Clear throughout to auscultation Heart: Regular rate and rhythm Abdomen: Soft, non distended, non-tender. No masses, no hepatomegaly. Normal bowel sounds Musculoskeletal: Symmetrical with no gross deformities  Extremities: No edema  Neurological: Alert oriented x 4, grossly nonfocal Psychological:  Alert and cooperative. Normal mood and affect  Assessment and  Recommendations: 78 year old male on warfarin recently found to have in the cardia of the stomach along the superior wall adjacent to the gastroesophageal junction a pedunculated avidly enhancing lesion. He was also noted to have colonic diverticulosis without evidence to sit just acute diverticulitis. At the rectosigmoid junction there is extensive colorectal wall thickening which is favored to be chronic and postinflammatory, however colonoscopy to exclude the presence of underlying neoplasm was recommended. Patient has been advised to undergo an EGD for further evaluation of the gastric abnormality noted, and will tentatively be scheduled for a colonoscopy at the same time to further evaluate the  thickening of the distal sigmoid and proximal rectum.The risks, benefits, and alternatives to endoscopy with possible biopsy and possible dilation were discussed with the patient and they consent to proceed. The risks, benefits, and alternatives to colonoscopy with possible biopsy and possible polypectomy were discussed with the patient and they consent to proceed. The risk of holding anticoagulation therapy or antiplatelet medications was discussed including the increased risk for thromboembolic disease that may include DVT, pulmonary emboli and stroke. The patient understands this risk and is willing to proceed with temporally holding the medication provided that this is approved by her PCP or cardiologist. The procedures will be scheduled with Dr. Olevia Perches.   Garima Chronis, Vita Barley PA-C 01/08/2014,

## 2014-01-08 NOTE — Patient Instructions (Signed)
You have been scheduled for an endoscopy and colonoscopy. Please follow the written instructions given to you at your visit today. Please pick up your prep at the pharmacy within the next 1-3 days. If you use inhalers (even only as needed), please bring them with you on the day of your procedure. Your physician has requested that you go to www.startemmi.com and enter the access code given to you at your visit today. This web site gives a general overview about your procedure. However, you should still follow specific instructions given to you by our office regarding your preparation for the procedure.  You will be contaced by our office prior to your procedure for directions on holding your Coumadin/Warfarin.  If you do not hear from our office 1 week prior to your scheduled procedure, please call [phone] to discuss.     Moviprep sent to your pharmacy

## 2014-01-08 NOTE — Telephone Encounter (Signed)
==  View-only below this line===  ----- Message -----    From: Vita Barley Hvozdovic, PA-C    Sent: 01/08/2014   1:25 PM      To: Oda Kilts, CMA  [place], can you swith him to an egd and flex sig instead of an egd and colon? Were you able to send his cardiologist a note about his warfarin? ----- Message -----    From: Lafayette Dragon, MD    Sent: 01/08/2014  12:39 PM      To: Vita Barley Hvozdovic, PA-C  Lets just do flex instead of the colon. ----- Message -----    From: Vita Barley Hvozdovic, PA-C    Sent: 01/08/2014  12:23 PM      To: Lafayette Dragon, MD  DR Olevia Perches, I saw this pt today. He had a CT that showed abnl thickening of gastric wall and abnl thickening of distal simoid/prox rectum. He had a colon in 01/2013 that was ok. I tenetatively booked him for a colon/egd together for in case you wanted to look at distal sigmoid/rectal area again as he will be off warfarin for egd. If you dint want to do colon as he had one in Jan, just let me know and we can cancel the colon. ( see progress note). Thanks, Cecille Rubin

## 2014-01-08 NOTE — Progress Notes (Signed)
Reviewed and agree.

## 2014-01-09 ENCOUNTER — Ambulatory Visit (INDEPENDENT_AMBULATORY_CARE_PROVIDER_SITE_OTHER): Payer: Medicare Other

## 2014-01-09 DIAGNOSIS — J309 Allergic rhinitis, unspecified: Secondary | ICD-10-CM

## 2014-01-10 ENCOUNTER — Ambulatory Visit (INDEPENDENT_AMBULATORY_CARE_PROVIDER_SITE_OTHER): Payer: Medicare Other

## 2014-01-10 DIAGNOSIS — J309 Allergic rhinitis, unspecified: Secondary | ICD-10-CM

## 2014-01-10 NOTE — Telephone Encounter (Signed)
Pt has a CHADS score of 3.  Per protocol, okay to hold Coumadin x 5 days with no Lovenox bridge.

## 2014-01-13 NOTE — Telephone Encounter (Signed)
Called patient to inform to hold coumadin

## 2014-01-15 ENCOUNTER — Other Ambulatory Visit: Payer: Self-pay | Admitting: Internal Medicine

## 2014-01-15 ENCOUNTER — Telehealth: Payer: Self-pay | Admitting: Internal Medicine

## 2014-01-15 NOTE — Telephone Encounter (Signed)
Pt confused because he thought he was having an EGD and colon. Pt is now scheduled for EGD and Flex-sig. Pt already has moviprep so he will complete the instructions he has for the moviprep. Pt had no further questions.

## 2014-01-16 ENCOUNTER — Telehealth: Payer: Self-pay | Admitting: Internal Medicine

## 2014-01-16 ENCOUNTER — Ambulatory Visit (INDEPENDENT_AMBULATORY_CARE_PROVIDER_SITE_OTHER): Payer: Medicare Other

## 2014-01-16 DIAGNOSIS — J309 Allergic rhinitis, unspecified: Secondary | ICD-10-CM | POA: Diagnosis not present

## 2014-01-16 NOTE — Telephone Encounter (Signed)
Spoke with pt and he had questions regarding his prep. Instructions reviewed with pt and questions answered.

## 2014-01-20 ENCOUNTER — Telehealth: Payer: Self-pay | Admitting: Internal Medicine

## 2014-01-20 NOTE — Telephone Encounter (Signed)
Left a message for patient to call back. 

## 2014-01-20 NOTE — Telephone Encounter (Signed)
No fever. Coughing up mucous that is dark green/black in color. Had sore throat but that is better. He is scheduled for EGD/flex sig on 01/22/14 and wants to know if he should reschedule. Please, advise.

## 2014-01-20 NOTE — Telephone Encounter (Signed)
Left a message for patient on voice mail with recommendation.

## 2014-01-20 NOTE — Telephone Encounter (Signed)
We will proceed unless he has a temp => 100 F

## 2014-01-21 DIAGNOSIS — L244 Irritant contact dermatitis due to drugs in contact with skin: Secondary | ICD-10-CM | POA: Diagnosis not present

## 2014-01-21 DIAGNOSIS — Z85828 Personal history of other malignant neoplasm of skin: Secondary | ICD-10-CM | POA: Diagnosis not present

## 2014-01-22 ENCOUNTER — Ambulatory Visit (AMBULATORY_SURGERY_CENTER): Payer: Medicare Other | Admitting: Internal Medicine

## 2014-01-22 ENCOUNTER — Other Ambulatory Visit: Payer: Self-pay | Admitting: Internal Medicine

## 2014-01-22 ENCOUNTER — Encounter: Payer: Self-pay | Admitting: Internal Medicine

## 2014-01-22 VITALS — BP 139/83 | HR 71 | Temp 96.7°F | Resp 44 | Ht 73.0 in | Wt 202.0 lb

## 2014-01-22 DIAGNOSIS — D131 Benign neoplasm of stomach: Secondary | ICD-10-CM

## 2014-01-22 DIAGNOSIS — R933 Abnormal findings on diagnostic imaging of other parts of digestive tract: Secondary | ICD-10-CM | POA: Diagnosis not present

## 2014-01-22 DIAGNOSIS — I509 Heart failure, unspecified: Secondary | ICD-10-CM | POA: Diagnosis not present

## 2014-01-22 DIAGNOSIS — K317 Polyp of stomach and duodenum: Secondary | ICD-10-CM

## 2014-01-22 DIAGNOSIS — I2510 Atherosclerotic heart disease of native coronary artery without angina pectoris: Secondary | ICD-10-CM | POA: Diagnosis not present

## 2014-01-22 DIAGNOSIS — I4891 Unspecified atrial fibrillation: Secondary | ICD-10-CM | POA: Diagnosis not present

## 2014-01-22 DIAGNOSIS — R938 Abnormal findings on diagnostic imaging of other specified body structures: Secondary | ICD-10-CM

## 2014-01-22 DIAGNOSIS — G4733 Obstructive sleep apnea (adult) (pediatric): Secondary | ICD-10-CM | POA: Diagnosis not present

## 2014-01-22 DIAGNOSIS — I252 Old myocardial infarction: Secondary | ICD-10-CM | POA: Diagnosis not present

## 2014-01-22 DIAGNOSIS — J45909 Unspecified asthma, uncomplicated: Secondary | ICD-10-CM | POA: Diagnosis not present

## 2014-01-22 DIAGNOSIS — R9389 Abnormal findings on diagnostic imaging of other specified body structures: Secondary | ICD-10-CM

## 2014-01-22 DIAGNOSIS — K5730 Diverticulosis of large intestine without perforation or abscess without bleeding: Secondary | ICD-10-CM | POA: Diagnosis not present

## 2014-01-22 DIAGNOSIS — K219 Gastro-esophageal reflux disease without esophagitis: Secondary | ICD-10-CM

## 2014-01-22 MED ORDER — SODIUM CHLORIDE 0.9 % IV SOLN: 500.0000 mL | INTRAVENOUS | Status: DC

## 2014-01-22 NOTE — Progress Notes (Signed)
No problems noted in the recovery room. maw 

## 2014-01-22 NOTE — Progress Notes (Signed)
Report to PACU, RN, vss, BBS= Clear.  

## 2014-01-22 NOTE — Patient Instructions (Signed)
YOU HAD AN ENDOSCOPIC PROCEDURE TODAY AT THE [facility]: Refer to the procedure report that was given to you for any specific questions about what was found during the examination.  If the procedure report does not answer your questions, please call your gastroenterologist to clarify.  If you requested that your care partner not be given the details of your procedure findings, then the procedure report has been included in a sealed envelope for you to review at your convenience later.  YOU SHOULD EXPECT: Some feelings of bloating in the abdomen. Passage of more gas than usual.  Walking can help get rid of the air that was put into your GI tract during the procedure and reduce the bloating. If you had a lower endoscopy (such as a colonoscopy or flexible sigmoidoscopy) you may notice spotting of blood in your stool or on the toilet paper. If you underwent a bowel prep for your procedure, then you may not have a normal bowel movement for a few days.  DIET: Your first meal following the procedure should be a light meal and then it is ok to progress to your normal diet.  A half-sandwich or bowl of soup is an example of a good first meal.  Heavy or fried foods are harder to digest and may make you feel nauseous or bloated.  Likewise meals heavy in dairy and vegetables can cause extra gas to form and this can also increase the bloating.  Drink plenty of fluids but you should avoid alcoholic beverages for 24 hours.  ACTIVITY: Your care partner should take you home directly after the procedure.  You should plan to take it easy, moving slowly for the rest of the day.  You can resume normal activity the day after the procedure however you should NOT DRIVE or use heavy machinery for 24 hours (because of the sedation medicines used during the test).    SYMPTOMS TO REPORT IMMEDIATELY: A gastroenterologist can be reached at any hour.  During normal business hours, 8:30 AM to 5:00 PM Monday through Friday,  call [phone].  After hours and on weekends, please call the GI answering service at [phone] who will take a message and have the physician on call contact you.   Following lower endoscopy (colonoscopy or flexible sigmoidoscopy):  Excessive amounts of blood in the stool  Significant tenderness or worsening of abdominal pains  Swelling of the abdomen that is new, acute  Fever of 100F or higher  Following upper endoscopy (EGD)  Vomiting of blood or coffee ground material  New chest pain or pain under the shoulder blades  Painful or persistently difficult swallowing  New shortness of breath  Fever of 100F or higher  Black, tarry-looking stools  FOLLOW UP: If any biopsies were taken you will be contacted by phone or by letter within the next 1-3 weeks.  Call your gastroenterologist if you have not heard about the biopsies in 3 weeks.  Our staff will call the home number listed on your records the next business day following your procedure to check on you and address any questions or concerns that you may have at that time regarding the information given to you following your procedure. This is a courtesy call and so if there is no answer at the home number and we have not heard from you through the emergency physician on call, we will assume that you have returned to your regular daily activities without incident.  SIGNATURES/CONFIDENTIALITY: You and/or your care   partner have signed paperwork which will be entered into your electronic medical record.  These signatures attest to the fact that that the information above on your After Visit Summary has been reviewed and is understood.  Full responsibility of the confidentiality of this discharge information lies with you and/or your care-partner.     Handouts were given to your care partner on gastritis, hemorrhoids, diverticulosis  and a high fiber diet with liberal fluid intake. You may resume your current medications today.  Pantoprazole 40 mg continued.  Resume coumadin Saturday, February 01, 2014. PT rechecked in 3 days after the coumadin is restarted.  Take OTC fiber supplement daily 1-2 teaspoons. Await biopsy results. Please call if any questions or concerns.

## 2014-01-22 NOTE — Op Note (Signed)
[name] [name], [id number]   FLEXIBLE SIGMOIDOSCOPY PROCEDURE REPORT  PATIENT: Corey [name], Corey [name]  MR#: [id number] BIRTHDATE: 07-08-1935 , 5  yrs. old GENDER: male ENDOSCOPIST: Lafayette Dragon, MD REFERRED BY: Janith Lima, M.D. PROCEDURE DATE:  01/22/2014 PROCEDURE:   Sigmoidoscopy, diagnostic ASA CLASS:   Class II INDICATIONS:abnormal CT scan of the abdomen consistent with thickening of the sigmoid colon.  History of diverticulosis and diverticulitis.  Recent colonoscopy within the last year. MEDICATIONS: Monitored anesthesia care and Propofol 100 mg IV  DESCRIPTION OF PROCEDURE:   After the risks benefits and alternatives of the procedure were thoroughly explained, informed consent was obtained.  Digital exam revealed no abnormalities of the rectum. The LB [id number]-Q180 [id number]  endoscope was introduced through the anus  and advanced to the sigmoid colon , The exam was Without limitations.    The quality of the prep was The overall prep quality was fair. .  The instrument was then slowly withdrawn as the mucosa was fully examined.         COLON FINDINGS: There was severe diverticulosis noted in the sigmoid colon with associated colonic narrowing, angulation, tortuosity and muscular hypertrophy.   Mild internal hemorrhoids.    Retroflexed views revealed no abnormalities.    The scope was then withdrawn from the patient and the procedure terminated.  COMPLICATIONS: There were no immediate complications.  ENDOSCOPIC IMPRESSION: 1.   There was severe diverticulosis noted in the sigmoid colon 2.   Mild internal hemorrhoids 3. severe diverticulosis with partial obstruction correlates with the abnormal thickening of the sigmoid colon on CT scan. There is no evidence of malignancy  RECOMMENDATIONS:  High fiber diet Fiber supplements Recall colonoscopy 10 years from the last colonoscopy  REPEAT EXAM:  eSigned:  Lafayette Dragon, MD  01/22/2014 4:58 PM   CC:

## 2014-01-22 NOTE — Op Note (Signed)
[facility]  [address] [place], [id number]   ENDOSCOPY PROCEDURE REPORT  PATIENT: Corey [name], Corey [name]  MR#: [id number] BIRTHDATE: 31-Dec-1935 , 78  yrs. old GENDER: male ENDOSCOPIST: Lafayette Dragon, MD REFERRED BY:  Janith Lima, M.D. PROCEDURE DATE:  01/22/2014 PROCEDURE:  EGD w/ directed submucosal injection(s), any substance and EGD w/ snare polypectomy ASA CLASS:     Class II INDICATIONS:  CT scan of the abdomen all revealed top polyp in the gastric cardia, history of esophageal stricture and gastritis. MEDICATIONS: Monitored anesthesia care and Propofol 300 mg IV TOPICAL ANESTHETIC: none  DESCRIPTION OF PROCEDURE: After the risks benefits and alternatives of the procedure were thoroughly explained, informed consent was obtained.  The LB [id number] endoscope was introduced through the mouth and advanced to the second portion of the duodenum , Without limitations.  The instrument was slowly withdrawn as the mucosa was fully examined.    Esophagus: proximal, mid and distal esophageal mucosa was normal. There were no esophageal varices or esophagitis. There was a mild nonobstructing esophageal stricture at the GE junction area the lumen was slightly angulated but endoscopes traverse into the stomach without resistance Stomach: large 2-3 cm pedunculated polyp was located in the gastric cardia just distal from the GE junction. It had friable mucosa and showed some signs of bleeding. It had thick stalk. The stalk was injected with total of 5 cc of epinephrine and snare polypectomy using cautery was carried out and polyp was recovered. 2 endoclips were placed at the stalk remnant without significant bleeding. There were 2 smaller sessile polyps in gastric cardia which were not removed. There was mild gastritis in gastric antrum. Retroflexion of the endoscope confirmed presence of gastric polyp.there was no evidence of gastric varices Pyloric outlet  was normal Duodenum: duodenal bulb and descending duodenum was normal[ The scope was then withdrawn from the patient and the procedure completed.  COMPLICATIONS: There were no immediate complications.  ENDOSCOPIC IMPRESSION: 1. large pedunculated take gastric polyp in the cardia. Removed with snare cautery, hemostasis achieved with epinephrine and endoclips 2. Mild antral gastritis 3. No evidence of esophageal or gastric varices  RECOMMENDATIONS: 1.  Await pathology results 2.  Continue PPI 3.hold Coumadin for 10 days, then restart and a half prothrombin time rechecked in 3 days after Coumadin restarted. 4.recall upper endoscopy pending path report  REPEAT EXAM:  eSigned:  Lafayette Dragon, MD 01/22/2014 4:53 PM    CC:[[place] Copy]  PATIENT NAME:  Corey [name], Corey [name] MR#: [id number]

## 2014-01-22 NOTE — Progress Notes (Signed)
Called to room to assist during endoscopic procedure.  Patient ID and intended procedure confirmed with present staff. Received instructions for my participation in the procedure from the performing physician.  

## 2014-01-23 ENCOUNTER — Ambulatory Visit: Payer: Medicare Other

## 2014-01-27 ENCOUNTER — Other Ambulatory Visit: Payer: Self-pay | Admitting: Internal Medicine

## 2014-01-27 ENCOUNTER — Telehealth: Payer: Self-pay

## 2014-01-27 NOTE — Telephone Encounter (Signed)
  Follow up Call-  Call back number 01/22/2014 02/05/2013  Post procedure Call Back phone  # [phone] hn  Permission to leave phone message Yes Yes     Patient questions:  Do you have a fever, pain , or abdominal swelling? No. Pain Score  0 *  Have you tolerated food without any problems? Yes.    Have you been able to return to your normal activities? Yes.    Do you have any questions about your discharge instructions: Diet   No. Medications  No. Follow up visit  No.  Do you have questions or concerns about your Care? No.  Actions: * If pain score is 4 or above: No action needed, pain <4.

## 2014-01-28 ENCOUNTER — Ambulatory Visit (INDEPENDENT_AMBULATORY_CARE_PROVIDER_SITE_OTHER): Payer: Medicare Other | Admitting: Internal Medicine

## 2014-01-28 ENCOUNTER — Ambulatory Visit (INDEPENDENT_AMBULATORY_CARE_PROVIDER_SITE_OTHER)
Admission: RE | Admit: 2014-01-28 | Discharge: 2014-01-28 | Disposition: A | Discharge status: Home / Self Care | Payer: Medicare Other | Source: Ambulatory Visit | Attending: Internal Medicine | Admitting: Internal Medicine

## 2014-01-28 ENCOUNTER — Encounter: Payer: Self-pay | Admitting: Internal Medicine

## 2014-01-28 VITALS — BP 128/72 | HR 73 | Temp 98.0°F | Resp 16 | Ht 73.0 in | Wt 201.0 lb

## 2014-01-28 DIAGNOSIS — R42 Dizziness and giddiness: Secondary | ICD-10-CM | POA: Diagnosis not present

## 2014-01-28 DIAGNOSIS — R51 Headache: Secondary | ICD-10-CM | POA: Diagnosis not present

## 2014-01-28 DIAGNOSIS — I10 Essential (primary) hypertension: Secondary | ICD-10-CM

## 2014-01-28 DIAGNOSIS — S0990XA Unspecified injury of head, initial encounter: Secondary | ICD-10-CM

## 2014-01-28 NOTE — Patient Instructions (Signed)

## 2014-01-28 NOTE — Progress Notes (Signed)
Subjective:    Patient ID: Corey [name], male    DOB: [birth year], 79 y.o.   MRN: [id number]  Head Injury  The incident occurred more than 1 week ago. The injury mechanism was a fall. There was no loss of consciousness. There was no blood loss. The quality of the pain is described as aching. The pain is at a severity of 1/10. The pain is mild. The pain has been intermittent since the injury. Pertinent negatives include no blurred vision, disorientation, headaches, memory loss, numbness, tinnitus, vomiting or weakness. He has tried nothing for the symptoms. The treatment provided moderate relief.      Review of Systems  Constitutional: Negative.  Negative for fever, chills, diaphoresis, appetite change and fatigue.  HENT: Negative.  Negative for congestion, nosebleeds and tinnitus.   Eyes: Negative.  Negative for blurred vision.  Respiratory: Negative.  Negative for cough, choking, chest tightness, shortness of breath and stridor.   Cardiovascular: Negative.  Negative for chest pain, palpitations and leg swelling.  Gastrointestinal: Negative.  Negative for vomiting and abdominal pain.  Endocrine: Negative.   Genitourinary: Negative.   Musculoskeletal: Positive for gait problem. Negative for back pain, joint swelling, arthralgias, neck pain and neck stiffness.  Skin: Negative.  Negative for rash.  Allergic/Immunologic: Negative.   Neurological: Positive for dizziness. Negative for tremors, seizures, syncope, facial asymmetry, speech difficulty, weakness, light-headedness, numbness and headaches.       He has had dizziness and feels like he is veering to the right since the HI, these symptoms are slowly getting better.  Hematological: Negative.  Negative for adenopathy. Does not bruise/bleed easily.  Psychiatric/Behavioral: Negative.  Negative for memory loss.       Objective:   Physical Exam  Constitutional: He is oriented to person, place, and time. He appears well-developed and  well-nourished.  Non-toxic appearance. He does not have a sickly appearance. He does not appear ill. No distress.  HENT:  Head: Normocephalic and atraumatic. Not macrocephalic and not microcephalic. Head is without raccoon's eyes and without Battle's sign.  Right Ear: No hemotympanum.  Left Ear: No hemotympanum.  Mouth/Throat: Oropharynx is clear and moist. No oropharyngeal exudate.  Eyes: Conjunctivae and EOM are normal. Pupils are equal, round, and reactive to light. Right eye exhibits no discharge. Left eye exhibits no discharge. No scleral icterus.  Neck: Normal range of motion. Neck supple. No JVD present. No tracheal deviation present. No thyromegaly present.  Cardiovascular: Normal rate, regular rhythm, normal heart sounds and intact distal pulses.  Exam reveals no gallop and no friction rub.   No murmur heard. Pulmonary/Chest: Effort normal and breath sounds normal. No stridor. No respiratory distress. He has no wheezes. He has no rales. He exhibits no tenderness.  Abdominal: Soft. Bowel sounds are normal. He exhibits no distension and no mass. There is no tenderness. There is no rebound and no guarding.  Musculoskeletal: Normal range of motion. He exhibits no edema or tenderness.  Lymphadenopathy:    He has no cervical adenopathy.  Neurological: He is alert and oriented to person, place, and time. He has normal strength and normal reflexes. He displays no atrophy, no tremor and normal reflexes. No cranial nerve deficit or sensory deficit. He exhibits normal muscle tone. He displays a negative Romberg sign. He displays no seizure activity. Coordination and gait normal. He displays no Babinski's sign on the right side. He displays no Babinski's sign on the left side.  Skin: Skin is warm and dry. No rash noted.  He is not diaphoretic. No erythema. No pallor.  Psychiatric: He has a normal mood and affect. His behavior is normal. Judgment and thought content normal.  Vitals reviewed.    Lab  Results  Component Value Date   WBC 3.9* 11/13/2013   HGB 12.6* 11/13/2013   HCT 38.4* 11/13/2013   PLT 219.0 11/13/2013   GLUCOSE 84 11/13/2013   CHOL 161 11/13/2013   TRIG 129.0 11/13/2013   HDL 55.50 11/13/2013   LDLCALC 80 11/13/2013   ALT 31 11/13/2013   AST 43* 11/13/2013   NA 140 11/13/2013   K 4.6 11/13/2013   CL 104 11/13/2013   CREATININE 1.2 12/25/2013   BUN 17 12/25/2013   CO2 33* 11/13/2013   TSH 2.12 11/13/2013   PSA 0.02* 11/13/2013   INR 2.4 11/25/2013   HGBA1C 6.0 11/13/2013       Assessment & Plan:

## 2014-01-28 NOTE — Assessment & Plan Note (Signed)
His exam and CT are normal He is improving, will follow for now He was intoxicated at the time of the fall, he has not had a drink in 5 days and agrees to abstain

## 2014-01-28 NOTE — Assessment & Plan Note (Signed)
His BP is well controlled 

## 2014-01-29 NOTE — Telephone Encounter (Signed)
CY please advise on refill. Thanks. 

## 2014-01-29 NOTE — Telephone Encounter (Signed)
Ok to refill 

## 2014-01-30 ENCOUNTER — Other Ambulatory Visit: Payer: Self-pay | Admitting: Internal Medicine

## 2014-01-30 ENCOUNTER — Encounter: Payer: Self-pay | Admitting: Internal Medicine

## 2014-01-30 ENCOUNTER — Ambulatory Visit (INDEPENDENT_AMBULATORY_CARE_PROVIDER_SITE_OTHER): Payer: Medicare Other

## 2014-01-30 ENCOUNTER — Other Ambulatory Visit: Payer: Self-pay | Admitting: Cardiovascular Disease

## 2014-01-30 DIAGNOSIS — J309 Allergic rhinitis, unspecified: Secondary | ICD-10-CM

## 2014-02-03 ENCOUNTER — Ambulatory Visit (INDEPENDENT_AMBULATORY_CARE_PROVIDER_SITE_OTHER): Payer: Medicare Other | Admitting: *Deleted

## 2014-02-03 DIAGNOSIS — I5022 Chronic systolic (congestive) heart failure: Secondary | ICD-10-CM

## 2014-02-03 DIAGNOSIS — Z95810 Presence of automatic (implantable) cardiac defibrillator: Secondary | ICD-10-CM

## 2014-02-04 ENCOUNTER — Ambulatory Visit: Payer: Medicare Other | Admitting: Internal Medicine

## 2014-02-04 ENCOUNTER — Telehealth: Payer: Self-pay | Admitting: Internal Medicine

## 2014-02-04 MED ORDER — ALBUTEROL SULFATE HFA 108 (90 BASE) MCG/ACT IN AERS: 1.0000 | INHALATION_SPRAY | Freq: Four times a day (QID) | RESPIRATORY_TRACT | Status: DC | PRN

## 2014-02-04 NOTE — Telephone Encounter (Signed)
Ok Rx albuterol HFA, # 1, 2 puffs every 6 hours if needed- rescue, refill prn

## 2014-02-04 NOTE — Telephone Encounter (Signed)
Called and spoke to pt. Informed pt of the albuterol refill. Rx sent to preferred pharmacy. Pt verbalized understanding and denied any further questions or concerns at this time.

## 2014-02-04 NOTE — Telephone Encounter (Signed)
Pt at the [employer] now.

## 2014-02-04 NOTE — Telephone Encounter (Signed)
Corey [name] awaiting in lobby. Spoke with Corey [name]. Corey [name] is requesting a refill of albuterol hfa sent to [facility]. This medication has never been filled by this office. Corey [name] stated he is going out of town and wants to be sure to have his rescue inhaler if needed. Corey [name] last seen on 11/18/13 by CY for OSA.  Dr. [name] please advise if you are ok filling this for the Corey [name]. Thanks.   Allergies  Allergen Reactions  . Hydrocodone Other (See Comments)    Severe panic attacks  . Oxycodone Other (See Comments)    Severe panic attacks    Current Outpatient Prescriptions on File Prior to Visit  Medication Sig Dispense Refill  . acetaminophen (TYLENOL) 500 MG tablet Take 500 mg by mouth 2 (two) times daily.     Marland Kitchen albuterol (PROVENTIL HFA;VENTOLIN HFA) 108 (90 BASE) MCG/ACT inhaler Inhale 2 puffs into the lungs every 6 (six) hours as needed for shortness of breath.     Marland Kitchen amiodarone (PACERONE) 200 MG tablet TAKE ONE-HALF TABLET DAILY 45 tablet 0  . clonazePAM (KLONOPIN) 0.5 MG tablet TAKE 1/2 TO 2 TABLETS AS NEEDED FOR SLEEP 30 tablet 0  . CRESTOR 5 MG tablet TAKE ONE TABLET IN THE EVENING (Patient taking differently: TAKE ONE TABLET BY MOUTH IN THE EVENING) 30 tablet 11  . desonide (DESOWEN) 0.05 % ointment     . folic acid (FOLVITE) 1 MG tablet Take 1 tablet (1 mg total) by mouth daily. 90 tablet 3  . metoprolol succinate (TOPROL-XL) 50 MG 24 hr tablet Take half a tablet at night. (Patient taking differently: Take half a tablet by mouth at night.) 30 tablet 3  . mometasone (NASONEX) 50 MCG/ACT nasal spray TWO SPRAYS IN EACH NOSTRIL DAILY AS NEEDED FOR ALLERGIES    . pantoprazole (PROTONIX) 40 MG tablet TAKE ONE TABLET BY MOUTH ONCE DAILY 30 tablet 2  . polyethylene glycol powder (GLYCOLAX/MIRALAX) powder Take 17 g by mouth daily as needed (constipation).     . ramipril (ALTACE) 10 MG capsule TAKE TWO CAPSULES EVERY DAY 60 capsule 2  . rivastigmine (EXELON) 1.5 MG capsule TAKE ONE CAPSULE TWICE A DAY 60  capsule 2  . spironolactone (ALDACTONE) 25 MG tablet TAKE ONE TABLET EACH DAY 30 tablet 3  . warfarin (COUMADIN) 2.5 MG tablet TAKE AS DIRECTED BY COUMADIN CLINIC 30 tablet 2   No current facility-administered medications on file prior to visit.

## 2014-02-07 ENCOUNTER — Encounter: Payer: Self-pay | Admitting: *Deleted

## 2014-02-07 ENCOUNTER — Telehealth: Payer: Self-pay | Admitting: *Deleted

## 2014-02-07 NOTE — Telephone Encounter (Signed)
ICM transmission received. I left a message for the patient to call at his home and cell #'s, but also advised I will try him back when I return to the office on Wednesday.

## 2014-02-07 NOTE — Telephone Encounter (Signed)
I spoke with the patient. 

## 2014-02-07 NOTE — Progress Notes (Signed)
EPIC Encounter for ICM Monitoring  Patient Name: Corey [name] is a 79 y.o. male Date: 02/07/2014 Primary Care Physican: Scarlette Calico, MD Primary Cardiologist: Allred Electrophysiologist: Allred Dry Weight: 196 lbs  Bi-V pacing: 99%       In the past month, have you:  1. Gained more than 2 pounds in a day or more than 5 pounds in a week? no  2. Had changes in your medications (with verification of current medications)? no  3. Had more shortness of breath than is usual for you? no  4. Limited your activity because of shortness of breath? no  5. Not been able to sleep because of shortness of breath? no  6. Had increased swelling in your feet or ankles? no  7. Had symptoms of dehydration (dizziness, dry mouth, increased thirst, decreased urine output) no  8. Had changes in sodium restriction? no  9. Been compliant with medication? Yes   ICM trend:   Follow-up plan: ICM clinic phone appointment: 03/10/14. The patient's impedence was below baseline from ~ 12/18-12/29. He denies any change in is symptoms. To note, he did take a fall earlier this month, but is doing ok. No changes made today.  Copy of note sent to patient's primary care physician, primary cardiologist, and device following physician.  Alvis Lemmings, RN, BSN 02/07/2014 5:01 PM

## 2014-02-20 ENCOUNTER — Ambulatory Visit (INDEPENDENT_AMBULATORY_CARE_PROVIDER_SITE_OTHER): Payer: Medicare Other

## 2014-02-20 DIAGNOSIS — J309 Allergic rhinitis, unspecified: Secondary | ICD-10-CM

## 2014-02-27 ENCOUNTER — Ambulatory Visit (INDEPENDENT_AMBULATORY_CARE_PROVIDER_SITE_OTHER): Payer: Medicare Other

## 2014-02-27 ENCOUNTER — Telehealth: Payer: Self-pay | Admitting: Internal Medicine

## 2014-02-27 DIAGNOSIS — J309 Allergic rhinitis, unspecified: Secondary | ICD-10-CM | POA: Diagnosis not present

## 2014-02-27 NOTE — Telephone Encounter (Signed)
I spoke with the patient on 02/07/14 for ICM clinic, but have not tried to call him since. No concerns at this time.

## 2014-02-27 NOTE — Telephone Encounter (Signed)
Error

## 2014-02-27 NOTE — Telephone Encounter (Signed)
New Msg       Pt returning a call that he received about a week ago.   Pt not sure why he was called. Are there concerns? If so please advise.  Please return call ONLY if concerns per pt.

## 2014-02-28 ENCOUNTER — Other Ambulatory Visit: Payer: Self-pay | Admitting: Internal Medicine

## 2014-02-28 NOTE — Telephone Encounter (Signed)
CY Please advise on refill. Thanks.  

## 2014-02-28 NOTE — Telephone Encounter (Signed)
Ok to refill 

## 2014-03-03 NOTE — Telephone Encounter (Addendum)
Rx signed and faxed to pharmacy by Davy Pique to [facility] pharmacy Fax # [phone] Nothing further needed.

## 2014-03-06 ENCOUNTER — Ambulatory Visit (INDEPENDENT_AMBULATORY_CARE_PROVIDER_SITE_OTHER): Payer: Medicare Other

## 2014-03-06 DIAGNOSIS — J309 Allergic rhinitis, unspecified: Secondary | ICD-10-CM | POA: Diagnosis not present

## 2014-03-07 ENCOUNTER — Other Ambulatory Visit: Payer: Self-pay | Admitting: Internal Medicine

## 2014-03-10 ENCOUNTER — Ambulatory Visit (INDEPENDENT_AMBULATORY_CARE_PROVIDER_SITE_OTHER): Payer: Medicare Other | Admitting: *Deleted

## 2014-03-10 DIAGNOSIS — I480 Paroxysmal atrial fibrillation: Secondary | ICD-10-CM | POA: Diagnosis not present

## 2014-03-10 DIAGNOSIS — Z7901 Long term (current) use of anticoagulants: Secondary | ICD-10-CM | POA: Diagnosis not present

## 2014-03-10 DIAGNOSIS — Z95810 Presence of automatic (implantable) cardiac defibrillator: Secondary | ICD-10-CM

## 2014-03-10 DIAGNOSIS — I5022 Chronic systolic (congestive) heart failure: Secondary | ICD-10-CM

## 2014-03-10 DIAGNOSIS — I4891 Unspecified atrial fibrillation: Secondary | ICD-10-CM | POA: Diagnosis not present

## 2014-03-10 DIAGNOSIS — Z5181 Encounter for therapeutic drug level monitoring: Secondary | ICD-10-CM | POA: Diagnosis not present

## 2014-03-10 LAB — MDC_IDC_ENUM_SESS_TYPE_REMOTE
Battery Remaining Longevity: 57 mo
Battery Remaining Percentage: 69 %
Battery Voltage: 2.95 V
Brady Statistic AP VS Percent: 1 %
Brady Statistic AS VP Percent: 1.3 %
Brady Statistic RA Percent Paced: 99 %
HighPow Impedance: 68 Ohm
HighPow Impedance: 68 Ohm
Implantable Pulse Generator Serial Number: 7057550
Lead Channel Impedance Value: 530 Ohm
Lead Channel Pacing Threshold Amplitude: 0.75 V
Lead Channel Pacing Threshold Amplitude: 1 V
Lead Channel Pacing Threshold Amplitude: 1.25 V
Lead Channel Pacing Threshold Pulse Width: 0.5 ms
Lead Channel Pacing Threshold Pulse Width: 0.5 ms
Lead Channel Sensing Intrinsic Amplitude: 11.8 mV
Lead Channel Setting Pacing Amplitude: 2.5 V
Lead Channel Setting Pacing Pulse Width: 0.5 ms
Lead Channel Setting Pacing Pulse Width: 0.5 ms
Lead Channel Setting Sensing Sensitivity: 0.5 mV
MDC IDC MSMT LEADCHNL LV IMPEDANCE VALUE: 1000 Ohm
MDC IDC MSMT LEADCHNL LV PACING THRESHOLD PULSEWIDTH: 0.5 ms
MDC IDC MSMT LEADCHNL RA SENSING INTR AMPL: 2.4 mV
MDC IDC MSMT LEADCHNL RV IMPEDANCE VALUE: 430 Ohm
MDC IDC PG MODEL: 3265
MDC IDC SESS DTM: 20160215070019
MDC IDC SET LEADCHNL LV PACING AMPLITUDE: 2 V
MDC IDC SET LEADCHNL RA PACING AMPLITUDE: 2 V
MDC IDC STAT BRADY AP VP PERCENT: 98 %
MDC IDC STAT BRADY AS VS PERCENT: 1 %
Zone Setting Detection Interval: 270 ms
Zone Setting Detection Interval: 340 ms

## 2014-03-10 LAB — POCT INR: INR: 2.1

## 2014-03-11 ENCOUNTER — Telehealth: Payer: Self-pay | Admitting: *Deleted

## 2014-03-11 NOTE — Telephone Encounter (Signed)
ICM transmission received. I left a message for the patient to call. 

## 2014-03-13 ENCOUNTER — Ambulatory Visit (INDEPENDENT_AMBULATORY_CARE_PROVIDER_SITE_OTHER): Payer: Medicare Other

## 2014-03-13 ENCOUNTER — Encounter: Payer: Self-pay | Admitting: *Deleted

## 2014-03-13 DIAGNOSIS — J309 Allergic rhinitis, unspecified: Secondary | ICD-10-CM

## 2014-03-13 NOTE — Telephone Encounter (Signed)
I spoke with the patient. 

## 2014-03-13 NOTE — Progress Notes (Signed)
EPIC Encounter for ICM Monitoring  Patient Name: Corey [name] is a 79 y.o. male Date: 03/13/2014 Primary Care Physican: Scarlette Calico, MD Primary Cardiologist: Allred Electrophysiologist: Allred Dry Weight: 196 lbs  Bi-V pacing: > 99%     In the past month, have you:  1. Gained more than 2 pounds in a day or more than 5 pounds in a week? no  2. Had changes in your medications (with verification of current medications)? no  3. Had more shortness of breath than is usual for you? no  4. Limited your activity because of shortness of breath? no  5. Not been able to sleep because of shortness of breath? no  6. Had increased swelling in your feet or ankles? no  7. Had symptoms of dehydration (dizziness, dry mouth, increased thirst, decreased urine output) no  8. Had changes in sodium restriction? no  9. Been compliant with medication? Yes   ICM trend:    Follow-up plan: ICM clinic phone appointment: 04/14/14. The patient's impedence has been below baseline from ~ 2/4 to present. He is doing well symptomatically per his report. He did not go in to detail, but his wife has had a recent accident of some sort and is recovering from that. He currently takes spironolactone 25 mg once daily. I have advised him to increase this to 50 mg daily x 3 days, then resume 25 mg once daily. He verbalizes understanding.   Copy of note sent to patient's primary care physician, primary cardiologist, and device following physician.  Alvis Lemmings, RN, BSN 03/13/2014 5:53 PM

## 2014-03-20 ENCOUNTER — Ambulatory Visit (INDEPENDENT_AMBULATORY_CARE_PROVIDER_SITE_OTHER): Payer: Medicare Other

## 2014-03-20 DIAGNOSIS — J309 Allergic rhinitis, unspecified: Secondary | ICD-10-CM | POA: Diagnosis not present

## 2014-03-24 ENCOUNTER — Other Ambulatory Visit: Payer: Self-pay | Admitting: Neurology

## 2014-03-25 ENCOUNTER — Encounter: Payer: Self-pay | Admitting: Cardiology

## 2014-03-27 ENCOUNTER — Ambulatory Visit (INDEPENDENT_AMBULATORY_CARE_PROVIDER_SITE_OTHER): Payer: Medicare Other

## 2014-03-27 DIAGNOSIS — J309 Allergic rhinitis, unspecified: Secondary | ICD-10-CM

## 2014-03-29 ENCOUNTER — Other Ambulatory Visit: Payer: Self-pay | Admitting: Internal Medicine

## 2014-03-31 NOTE — Telephone Encounter (Signed)
Ok to refill 

## 2014-03-31 NOTE — Telephone Encounter (Signed)
Clonazepam 0.5 mg last refilled 03/03/14 #30 x 0 refills TAKE 1/2 TO 2 TABLETS AS NEEDED FOR SLEEP  Last OV 11/18/13 Pending 05/20/14 Please advise CDY thanks

## 2014-04-02 ENCOUNTER — Encounter: Payer: Self-pay | Admitting: Neurology

## 2014-04-02 ENCOUNTER — Ambulatory Visit (INDEPENDENT_AMBULATORY_CARE_PROVIDER_SITE_OTHER): Payer: Medicare Other | Admitting: Neurology

## 2014-04-02 VITALS — BP 108/56 | HR 74 | Temp 97.7°F | Resp 16 | Ht 74.0 in | Wt 203.2 lb

## 2014-04-02 DIAGNOSIS — G3184 Mild cognitive impairment, so stated: Secondary | ICD-10-CM

## 2014-04-02 DIAGNOSIS — F329 Major depressive disorder, single episode, unspecified: Secondary | ICD-10-CM

## 2014-04-02 DIAGNOSIS — F32A Depression, unspecified: Secondary | ICD-10-CM

## 2014-04-02 NOTE — Addendum Note (Signed)
Addended by: Charyl Bigger E on: 04/02/2014 02:36 PM   Modules accepted: Orders

## 2014-04-02 NOTE — Progress Notes (Signed)
NEUROLOGY FOLLOW UP OFFICE NOTE  Corey [name] [id number]  HISTORY OF PRESENT ILLNESS: Corey [name] is a 79 year old right-handed man with history of paroxysmal atrial fibrillation with PM, MI and CAD, TIA, hypertension, OSA, hyperlipidemia, alcohol abuse, and history of prostate cancer who follows up for amnestic MCI.  He is accompanied by his wife, who provides some history.   UPDATE: He currently is on the [employer].  He has been tolerating it okay.  He has been more irritable and impatient.  He does feel a little depressed, particularly due to his his physical ailments and regarding care for his autistic son.  He feels he doesn't have a focus.  He has friends who urge him to take up golf with them.      HISTORY: His wife and daughters note some mild memory problems for a little while, but has significantly been more noticeable over the past 5 months, since he retired in June.  Memory has been the primary problem.  He was called his daughters several times in a day asking about the same thing.  He still drives but is now mildly disoriented driving around home.  There has been no accidents or near-accidents, but he is more irritable and impulsive, and will drive a little faster and tail gate.  He has poor night vision.  They have a new grandson.  Recently, he forgot about a party for his new grandchild and instead made plans with friends.  He has begun to forget names of family and friends.  He has not had any significant change in personality other than irritability.  Besides the aggressive driving, he uses foul language a little more.  He does not act inappropriately.  He does feel depressed, as he has been going through an adjustment since retiring.  He has had more falls recently.  One time, the rug slipped from underneath him.  Another time, he tripped over a hose at the gas station.  He also had experienced bowel incontinence.  He has not had any problems at work prior to retirement.  He has  not had hallucinations or delusions. He has not had hallucinations or delusions.  He has not had difficulty performing everyday tasks.  He notes some problems but doesn't think it is as bad as his family makes it out to be.    He worked in [employer] for many years.  Since retirement, he typically doesn't do too much.  He will sit around.  He doesn't watch much TV during the day.  He reads the newspaper daily and able to retain information.  He is able to stay focus.  He and his wife are not as social as they used to be.    His mother had dementia.   Over the past couple of years, he has had several operations, including pacemaker and back surgery.  He attributes these changes to his multiple surgeries.  He also has depression.  He has been depressed for several years, triggered by several events in his life, such as pain due to multiple back surgeries and the passing of his daughter.  It has worsened particularly over the past two years.  He and his wife traveled around the country this past summer, but he didn't quite seem to enjoy it as much as she should.  He doesn't have any desire to go out and be active or social.  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  His diet is poor, and he is prone to skipping meals.  He does not get much exercise due to chronic back problems.  His sleep is poor.  He wakes up several times during the night.  He takes an Ambien at times.  He is irritable and will blow up at his wife at times.  He is not physically abusive.  He has not been crying.  His cardiologist discontinued the metoprolol to see if it would help alleviate his symptoms.  He does plan on going down to [place] in a few weeks to see his buddies for their 58th high school reunion.  He did not tolerate Aricept due to vivid dreams.  01/09/13 LABS:  B12 285, methylmalonic acid 0.14 01/11/13 CT Head:  small vessel disease with remote infarcts in the left temporal lobe and left  cerebellum, with accompanying diffuse atrophy 06/10/13 MMSE 29/30. 10/09/13 MOCA 27/30  PAST MEDICAL HISTORY: Past Medical History  Diagnosis Date  . Alcohol abuse, episodic 05/19/2008  . ALLERGIC RHINITIS 04/15/2009  . COLONIC POLYPS, ADENOMATOUS, HX OF 02/14/2008  . DIVERTICULOSIS, COLON 02/14/2008  . ESOPHAGEAL STRICTURE 01/28/2008  . GERD 02/14/2008  . HYPERGLYCEMIA 11/16/2006  . HYPERLIPIDEMIA 02/14/2008  . INSOMNIA-SLEEP DISORDER-UNSPEC 02/11/2009  . 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 PROSTATE CANCER, HX OF 02/25/2000  . TRANSIENT ISCHEMIC ATTACKS, HX OF 11/16/2006  . Arthritis   . Atrial fibrillation 11/16/2006    remote CHF related to atrial fib with rapid ventricular response over 25 yrs per office note,  . ASTHMA 11/16/2006    sinusitis-    hx ?yeast patch/white patch on vocal cord as per [facility] 02/25/11-   . OBSTRUCTIVE SLEEP APNEA 11/10/2008  . SLEEP APNEA 10/03/2009    LOV Dr [name] 12/12 in EPIC    Moderate per patient- settings ?6/last sleep study years ago  . HYPERTENSION 11/16/2006  . Symptomatic bradycardia, secondary to sinus node dysfunction 07/08/2011    s/p [facility] Scientific PPM by Dr Sallyanne Kuster, upgraded to [place] ICD 12/13 by Dr Rayann Heman ([facility])  . Myocardial infarction 1993  . Pacemaker     St. Jude  . ICD (implantable cardiac defibrillator) in place   . Anxiety   . Depression   . Pneumonia     hx of  . Headache(784.0)   . Cancer 2002    prostate cancer  . CORONARY ARTERY DISEASE 11/16/2006    LHC (07/2011): LAD with luminal irregularities, proximal circumflex 50%, EF 25%  . Chronic systolic CHF (congestive heart failure)     echo (10/02/12): EF 96-29%, grade 1 diastolic dysfunction, trivial AI, mild MR, mild RVE  . NICM (nonischemic cardiomyopathy)   . Stroke     Tia  . Lesion of vocal cord     bx begine  . Allergy     YEAR ROUND,TAKE SHOTS  . Sleep apnea   . Alzheimer's dementia     beginning with sx    MEDICATIONS: Current Outpatient Prescriptions on File  Prior to Visit  Medication Sig Dispense Refill  . acetaminophen (TYLENOL) 500 MG tablet Take 500 mg by mouth 2 (two) times daily.     Marland Kitchen albuterol (PROVENTIL HFA;VENTOLIN HFA) 108 (90 BASE) MCG/ACT inhaler Inhale 1-2 puffs into the lungs every 6 (six) hours as needed for shortness of breath. 1 Inhaler 11  . amiodarone (PACERONE) 200 MG tablet TAKE ONE-HALF TABLET DAILY 45 tablet 0  . clonazePAM (KLONOPIN) 0.5 MG tablet TAKE 1/2 TO 2 TABLETS AS NEEDED FOR SLEEP 30 tablet  0  . CRESTOR 5 MG tablet TAKE ONE TABLET IN THE EVENING (Patient taking differently: TAKE ONE TABLET BY MOUTH IN THE EVENING) 30 tablet 11  . desonide (DESOWEN) 0.05 % ointment     . folic acid (FOLVITE) 1 MG tablet Take 1 tablet (1 mg total) by mouth daily. 90 tablet 3  . metoprolol succinate (TOPROL-XL) 50 MG 24 hr tablet Take half a tablet at night. (Patient taking differently: Take half a tablet by mouth at night.) 30 tablet 3  . mometasone (NASONEX) 50 MCG/ACT nasal spray TWO SPRAYS IN EACH NOSTRIL DAILY AS NEEDED FOR ALLERGIES    . pantoprazole (PROTONIX) 40 MG tablet TAKE ONE TABLET BY MOUTH ONCE DAILY 30 tablet 2  . polyethylene glycol powder (GLYCOLAX/MIRALAX) powder Take 17 g by mouth daily as needed (constipation).     . ramipril (ALTACE) 10 MG capsule TAKE TWO CAPSULES EVERY DAY 60 capsule 2  . rivastigmine (EXELON) 1.5 MG capsule TAKE ONE CAPSULE TWICE A DAY 60 capsule 2  . spironolactone (ALDACTONE) 25 MG tablet TAKE ONE TABLET EACH DAY 30 tablet 3  . warfarin (COUMADIN) 2.5 MG tablet TAKE AS DIRECTED BY COUMADIN CLINIC 30 tablet 1   No current facility-administered medications on file prior to visit.    ALLERGIES: Allergies  Allergen Reactions  . Hydrocodone Other (See Comments)    Severe panic attacks  . Oxycodone Other (See Comments)    Severe panic attacks    FAMILY HISTORY: Family History  Problem Relation Age of Onset  . Heart attack Father   . Heart attack Brother   . Heart disease Maternal Uncle    . Heart attack Maternal Uncle   . Colon cancer Neg Hx   . Esophageal cancer Neg Hx   . Rectal cancer Neg Hx   . Prostate cancer Neg Hx   . Stomach cancer Neg Hx     SOCIAL HISTORY: History   Social History  . Marital Status: Married    Spouse Name: N/A  . Number of Children: N/A  . Years of Education: N/A   Occupational History  . Not on file.   Social History Main Topics  . Smoking status: Former Smoker -- 1.00 packs/day for 4 years    Types: Cigarettes    Quit date: 03/22/1964  . Smokeless tobacco: Never Used     Comment: reports smoked socially  . Alcohol Use: 9.0 oz/week    15 Shots of liquor per week     Comment: 2 mixed drinks daily, some days  . Drug Use: No  . Sexual Activity:    Partners: Female   Other Topics Concern  . Not on file   Social History Narrative    REVIEW OF SYSTEMS: Constitutional: No fevers, chills, or sweats, no generalized fatigue, change in appetite Eyes: No visual changes, double vision, eye pain Ear, nose and throat: No hearing loss, ear pain, nasal congestion, sore throat Cardiovascular: No chest pain, palpitations Respiratory:  No shortness of breath at rest or with exertion, wheezes GastrointestinaI: No nausea, vomiting, diarrhea, abdominal pain, fecal incontinence Genitourinary:  No dysuria, urinary retention or frequency Musculoskeletal:  No neck pain, back pain Integumentary: No rash, pruritus, skin lesions Neurological: as above Psychiatric: No depression, insomnia, anxiety Endocrine: No palpitations, fatigue, diaphoresis, mood swings, change in appetite, change in weight, increased thirst Hematologic/Lymphatic:  No anemia, purpura, petechiae. Allergic/Immunologic: no itchy/runny eyes, nasal congestion, recent allergic reactions, rashes  PHYSICAL EXAM: Filed Vitals:   04/02/14 1036  BP: 108/56  Pulse: 74  Temp: 97.7 F (36.5 C)  Resp: 16   General: No acute distress Head:  Normocephalic/atraumatic Eyes:   Fundoscopic exam unremarkable without vessel changes, exudates, hemorrhages or papilledema. Neck: supple, no paraspinal tenderness, full range of motion Heart:  Regular rate and rhythm Lungs:  Clear to auscultation bilaterally Back: No paraspinal tenderness Neurological Exam: alert and oriented to person, place, and time. Attention span and concentration intact, recent memory impaired and remote memory intact, fund of knowledge intact.  Speech fluent and not dysarthric, language intact.   Montreal Cognitive Assessment  04/02/2014 10/09/2013  Visuospatial/ Executive (0/5) 5 5  Naming (0/3) 3 3  Attention: Read list of digits (0/2) 2 2  Attention: Read list of letters (0/1) 1 1  Attention: Serial 7 subtraction starting at 100 (0/3) 3 3  Language: Repeat phrase (0/2) 2 2  Language : Fluency (0/1) 1 1  Abstraction (0/2) 2 2  Delayed Recall (0/5) 2 2  Orientation (0/6) 6 6  Total 27 27  Adjusted Score (based on education) 27 -   CN II-XII intact. Fundoscopic exam unremarkable without vessel changes, exudates, hemorrhages or papilledema.  Bulk and tone normal, muscle strength 5/5 throughout.  Sensation to light touch intact.  Deep tendon reflexes 2+ throughout.  Finger to nose with intention tremor bilaterally.  Gait normal, Romberg negative.  IMPRESSION: Amnestic mild cognitive impairment Depression  PLAN: Continue Exelon Encouraged social interaction and exercise Refer to Dr. Cheryln Manly of Behavioral Medicine regarding his depression Follow up in 6 months.  Metta Clines, DO  CC: Scarlette Calico, MD

## 2014-04-02 NOTE — Patient Instructions (Signed)
Continue the Exelon Will refer you to Dr. Cheryln Manly Continue staying active )(golf, the gym, walks, meeting with friends, reading) Follow up in 6 months.

## 2014-04-03 ENCOUNTER — Ambulatory Visit (INDEPENDENT_AMBULATORY_CARE_PROVIDER_SITE_OTHER): Payer: Medicare Other

## 2014-04-03 DIAGNOSIS — J309 Allergic rhinitis, unspecified: Secondary | ICD-10-CM

## 2014-04-04 ENCOUNTER — Encounter: Payer: Self-pay | Admitting: Internal Medicine

## 2014-04-10 ENCOUNTER — Ambulatory Visit (INDEPENDENT_AMBULATORY_CARE_PROVIDER_SITE_OTHER): Payer: Medicare Other

## 2014-04-10 DIAGNOSIS — J309 Allergic rhinitis, unspecified: Secondary | ICD-10-CM

## 2014-04-14 ENCOUNTER — Ambulatory Visit (INDEPENDENT_AMBULATORY_CARE_PROVIDER_SITE_OTHER): Payer: Medicare Other | Admitting: *Deleted

## 2014-04-14 ENCOUNTER — Encounter: Payer: Self-pay | Admitting: *Deleted

## 2014-04-14 ENCOUNTER — Encounter: Payer: Self-pay | Admitting: Cardiovascular Disease

## 2014-04-14 DIAGNOSIS — I5022 Chronic systolic (congestive) heart failure: Secondary | ICD-10-CM | POA: Diagnosis not present

## 2014-04-14 DIAGNOSIS — Z95810 Presence of automatic (implantable) cardiac defibrillator: Secondary | ICD-10-CM | POA: Diagnosis not present

## 2014-04-14 NOTE — Progress Notes (Signed)
EPIC Encounter for ICM Monitoring  Patient Name: Corey [name] is a 79 y.o. male Date: 04/14/2014 Primary Care Physican: Scarlette Calico, MD Primary Cardiologist: Allred Electrophysiologist: Allred Dry Weight: 200 lbs  Bi-V pacing: >99%       In the past month, have you:  1. Gained more than 2 pounds in a day or more than 5 pounds in a week? No. Per the patient's report, he did notice that his weight was up to 202-203 lbs at one point this month, but he is at 200 lbs now.   2. Had changes in your medications (with verification of current medications)? no  3. Had more shortness of breath than is usual for you? no  4. Limited your activity because of shortness of breath? no  5. Not been able to sleep because of shortness of breath? no  6. Had increased swelling in your feet or ankles? no  7. Had symptoms of dehydration (dizziness, dry mouth, increased thirst, decreased urine output) no  8. Had changes in sodium restriction? no  9. Been compliant with medication? Yes   ICM trend:   Follow-up plan: ICM clinic phone appointment: 06/02/14. The patient's impedence was below baseline from ~ 3/9- 3/15. He denies any change in his symptomss, but did report his weight was up to ~202- 203 lbs at some time this month. He could not recall when. Impedence is back to baseline. No changes made today.   Copy of note sent to patient's primary care physician, primary cardiologist, and device following physician.  Alvis Lemmings, RN, BSN 04/14/2014 3:56 PM

## 2014-04-16 ENCOUNTER — Other Ambulatory Visit: Payer: Self-pay | Admitting: Internal Medicine

## 2014-04-17 ENCOUNTER — Ambulatory Visit (INDEPENDENT_AMBULATORY_CARE_PROVIDER_SITE_OTHER): Payer: Medicare Other

## 2014-04-17 DIAGNOSIS — J309 Allergic rhinitis, unspecified: Secondary | ICD-10-CM | POA: Diagnosis not present

## 2014-04-24 ENCOUNTER — Ambulatory Visit (INDEPENDENT_AMBULATORY_CARE_PROVIDER_SITE_OTHER): Payer: Medicare Other

## 2014-04-24 DIAGNOSIS — J309 Allergic rhinitis, unspecified: Secondary | ICD-10-CM

## 2014-04-29 ENCOUNTER — Other Ambulatory Visit: Payer: Self-pay | Admitting: Internal Medicine

## 2014-05-01 ENCOUNTER — Ambulatory Visit: Payer: Medicare Other

## 2014-05-01 ENCOUNTER — Encounter: Payer: Self-pay | Admitting: Internal Medicine

## 2014-05-01 ENCOUNTER — Ambulatory Visit (INDEPENDENT_AMBULATORY_CARE_PROVIDER_SITE_OTHER): Payer: Medicare Other | Admitting: Internal Medicine

## 2014-05-01 VITALS — BP 122/72 | HR 65 | Ht 73.0 in | Wt 206.6 lb

## 2014-05-01 DIAGNOSIS — I255 Ischemic cardiomyopathy: Secondary | ICD-10-CM | POA: Diagnosis not present

## 2014-05-01 DIAGNOSIS — I10 Essential (primary) hypertension: Secondary | ICD-10-CM | POA: Diagnosis not present

## 2014-05-01 DIAGNOSIS — I5022 Chronic systolic (congestive) heart failure: Secondary | ICD-10-CM | POA: Diagnosis not present

## 2014-05-01 DIAGNOSIS — I480 Paroxysmal atrial fibrillation: Secondary | ICD-10-CM | POA: Diagnosis not present

## 2014-05-01 DIAGNOSIS — I495 Sick sinus syndrome: Secondary | ICD-10-CM | POA: Diagnosis not present

## 2014-05-01 LAB — BASIC METABOLIC PANEL
BUN: 22 mg/dL (ref 6–23)
CALCIUM: 9.1 mg/dL (ref 8.4–10.5)
CO2: 28 mEq/L (ref 19–32)
Chloride: 105 mEq/L (ref 96–112)
Creatinine, Ser: 1.4 mg/dL (ref 0.40–1.50)
GFR: 52.31 mL/min — AB (ref >60.00)
GLUCOSE: 107 mg/dL — AB (ref 70–99)
POTASSIUM: 4.4 meq/L (ref 3.5–5.1)
SODIUM: 138 meq/L (ref 135–145)

## 2014-05-01 LAB — T4, FREE: Free T4: 0.93 ng/dL (ref 0.60–1.60)

## 2014-05-01 LAB — MDC_IDC_ENUM_SESS_TYPE_INCLINIC
Brady Statistic RV Percent Paced: 99.18 %
Date Time Interrogation Session: 20160407135436
HIGH POWER IMPEDANCE MEASURED VALUE: 72 Ohm
Implantable Pulse Generator Serial Number: 7057550
Lead Channel Impedance Value: 1150 Ohm
Lead Channel Impedance Value: 425 Ohm
Lead Channel Pacing Threshold Amplitude: 1 V
Lead Channel Pacing Threshold Amplitude: 1.25 V
Lead Channel Pacing Threshold Pulse Width: 0.5 ms
Lead Channel Pacing Threshold Pulse Width: 0.5 ms
Lead Channel Setting Pacing Amplitude: 2 V
Lead Channel Setting Pacing Amplitude: 2.5 V
Lead Channel Setting Pacing Pulse Width: 0.5 ms
Lead Channel Setting Pacing Pulse Width: 0.5 ms
MDC IDC MSMT BATTERY REMAINING LONGEVITY: 54 mo
MDC IDC MSMT LEADCHNL LV PACING THRESHOLD AMPLITUDE: 1 V
MDC IDC MSMT LEADCHNL LV PACING THRESHOLD PULSEWIDTH: 0.5 ms
MDC IDC MSMT LEADCHNL RA IMPEDANCE VALUE: 525 Ohm
MDC IDC MSMT LEADCHNL RA SENSING INTR AMPL: 2.4 mV
MDC IDC MSMT LEADCHNL RV SENSING INTR AMPL: 11 mV
MDC IDC PG MODEL: 3265
MDC IDC SET LEADCHNL RA PACING AMPLITUDE: 2 V
MDC IDC SET LEADCHNL RV SENSING SENSITIVITY: 0.5 mV
MDC IDC SET ZONE DETECTION INTERVAL: 270 ms
MDC IDC STAT BRADY RA PERCENT PACED: 99 %
Zone Setting Detection Interval: 340 ms

## 2014-05-01 LAB — HEPATIC FUNCTION PANEL
ALT: 23 U/L (ref 0–53)
AST: 29 U/L (ref 0–37)
Albumin: 3.9 g/dL (ref 3.5–5.2)
Alkaline Phosphatase: 68 U/L (ref 39–117)
BILIRUBIN DIRECT: 0.1 mg/dL (ref 0.0–0.3)
Total Bilirubin: 0.4 mg/dL (ref 0.2–1.2)
Total Protein: 7.1 g/dL (ref 6.0–8.3)

## 2014-05-01 LAB — TSH: TSH: 2.76 u[IU]/mL (ref 0.35–4.50)

## 2014-05-01 NOTE — Progress Notes (Signed)
Electrophysiology Office Note   Date:  05/01/2014   ID:  Corey [name], DOB [birth year], MRN [id number]  PCP:  Scarlette Calico, MD  Cardiologist:  Previously Dr Rollene Fare Primary Electrophysiologist: Thompson Grayer, MD    Chief Complaint  Patient presents with  . Atrial Fibrillation    PAF     History of Present Illness: Corey [name] is a 79 y.o. male who presents today for electrophysiology evaluation.   He is doing well.  He has been a heavy drinker but says that he has significantly "cut back".  He looks good today.  He is more active.   Today, he denies symptoms of palpitations, chest pain, shortness of breath, orthopnea, PND, lower extremity edema, claudication, dizziness, presyncope, syncope, bleeding, or neurologic sequela. The patient is tolerating medications without difficulties and is otherwise without complaint today.    Past Medical History  Diagnosis Date  . Alcohol abuse, episodic 05/19/2008  . ALLERGIC RHINITIS 04/15/2009  . COLONIC POLYPS, ADENOMATOUS, HX OF 02/14/2008  . DIVERTICULOSIS, COLON 02/14/2008  . ESOPHAGEAL STRICTURE 01/28/2008  . GERD 02/14/2008  . HYPERGLYCEMIA 11/16/2006  . HYPERLIPIDEMIA 02/14/2008  . INSOMNIA-SLEEP DISORDER-UNSPEC 02/11/2009  . 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 PROSTATE CANCER, HX OF 02/25/2000  . TRANSIENT ISCHEMIC ATTACKS, HX OF 11/16/2006  . Arthritis   . Atrial fibrillation 11/16/2006    remote CHF related to atrial fib with rapid ventricular response over 25 yrs per office note,  . ASTHMA 11/16/2006    sinusitis-    hx ?yeast patch/white patch on vocal cord as per [facility] 02/25/11-   . OBSTRUCTIVE SLEEP APNEA 11/10/2008  . SLEEP APNEA 10/03/2009    LOV Dr [name] 12/12 in [facility]    Moderate per patient- settings ?6/last sleep study years ago  . HYPERTENSION 11/16/2006  . Symptomatic bradycardia, secondary to sinus node dysfunction 07/08/2011    s/p [facility] Scientific PPM by Dr Sallyanne Kuster, upgraded to [employer] ICD 12/13 by Dr Rayann Heman  (SJM)  . Myocardial infarction 1993  . Pacemaker     St. Jude  . ICD (implantable cardiac defibrillator) in place   . Anxiety   . Depression   . Pneumonia     hx of  . Headache(784.0)   . Cancer 2002    prostate cancer  . CORONARY ARTERY DISEASE 11/16/2006    LHC (07/2011): LAD with luminal irregularities, proximal circumflex 50%, EF 25%  . Chronic systolic CHF (congestive heart failure)     echo (10/02/12): EF 53-29%, grade 1 diastolic dysfunction, trivial AI, mild MR, mild RVE  . NICM (nonischemic cardiomyopathy)   . Stroke     Tia  . Lesion of vocal cord     bx begine  . Allergy     YEAR ROUND,TAKE SHOTS  . Sleep apnea   . Alzheimer's dementia     beginning with sx   Past Surgical History  Procedure Laterality Date  . Tonsilectomy, adenoidectomy, bilateral myringotomy and tubes    . Knee arthroscopy      2 surgeries right and 1 left  . Prostate surgery  2/02    prostate cancer  . Uvulopalatopharyngoplasty  2013  . Esophagogastroduodenoscopy      with dilitation  . Lumbar laminectomy/decompression microdiscectomy  03/02/2011    Procedure: LUMBAR LAMINECTOMY/DECOMPRESSION MICRODISCECTOMY;  Surgeon: [name], MD;  Location: [facility] ORS;  Service: Orthopedics;  Laterality: N/A;  Decompression Lumbar 4 - Lumbar(X-Ray)  . Pacemaker insertion  06/2011    [employer] PPM implanted by  Dr Sallyanne Kuster  . Cardiac defibrillator placement  01/16/12    Upgrade to a biventricular SJM ICD by DR Novah Goza  . Tonsillectomy  as child  . Cardiac catheterization      several  . Back surgery    . Insert / replace / remove pacemaker    . Total knee arthroplasty Left 06/04/2012    Procedure: TOTAL KNEE ARTHROPLASTY;  Surgeon: [name], MD;  Location: [facility] ORS;  Service: Orthopedics;  Laterality: Left;  . Colonoscopy    . Vocal cord lesion bx    . Permanent pacemaker insertion Left 07/08/2011    Procedure: PERMANENT PACEMAKER INSERTION;  Surgeon: [name], MD;  Location: [facility] CATH  LAB;  Service: Cardiovascular;  Laterality: Left;  . Left and right heart catheterization with coronary angiogram N/A 08/18/2011    Procedure: LEFT AND RIGHT HEART CATHETERIZATION WITH CORONARY ANGIOGRAM;  Surgeon: [name], MD;  Location: [facility] CATH LAB;  Service: Cardiovascular;  Laterality: N/A;  . Bi-ventricular implantable cardioverter defibrillator upgrade N/A 01/16/2012    Procedure: BI-VENTRICULAR IMPLANTABLE CARDIOVERTER DEFIBRILLATOR UPGRADE;  Surgeon: Thompson Grayer, MD;  Location: [facility] CATH LAB;  Service: Cardiovascular;  Laterality: N/A;     Current Outpatient Prescriptions  Medication Sig Dispense Refill  . acetaminophen (TYLENOL) 500 MG tablet Take 500 mg by mouth 2 (two) times daily.     Marland Kitchen albuterol (PROVENTIL HFA;VENTOLIN HFA) 108 (90 BASE) MCG/ACT inhaler Inhale 1-2 puffs into the lungs every 6 (six) hours as needed for shortness of breath. 1 Inhaler 11  . amiodarone (PACERONE) 200 MG tablet TAKE ONE-HALF TABLET DAILY (Patient taking differently: TAKE ONE-HALF TABLET BY MOUTH DAILY) 45 tablet 1  . clonazePAM (KLONOPIN) 0.5 MG tablet TAKE 1/2 TO 2 TABLETS AS NEEDED FOR SLEEP (Patient taking differently: TAKE 1/2 TO 2 TABLETS BY MOUTH AT BEDTIME AS NEEDED FOR SLEEP) 30 tablet 0  . CRESTOR 5 MG tablet TAKE ONE TABLET IN THE EVENING (Patient taking differently: TAKE ONE TABLET BY MOUTH IN THE EVENING) 30 tablet 11  . folic acid (FOLVITE) 1 MG tablet Take 1 tablet (1 mg total) by mouth daily. 90 tablet 3  . metoprolol succinate (TOPROL-XL) 50 MG 24 hr tablet Take half a tablet at night. (Patient taking differently: Take half a tablet by mouth at night.) 30 tablet 3  . mometasone (NASONEX) 50 MCG/ACT nasal spray TWO SPRAYS IN EACH NOSTRIL DAILY AS NEEDED FOR ALLERGIES    . pantoprazole (PROTONIX) 40 MG tablet TAKE ONE TABLET BY MOUTH ONCE DAILY 30 tablet 3  . polyethylene glycol powder (GLYCOLAX/MIRALAX) powder Take 17 g by mouth daily as needed (constipation).     . ramipril (ALTACE)  10 MG capsule TAKE TWO CAPSULES EVERY DAY (Patient taking differently: TAKE TWO CAPSULES BY MOUTH EVERY DAY) 60 capsule 2  . rivastigmine (EXELON) 1.5 MG capsule TAKE ONE CAPSULE TWICE A DAY (Patient taking differently: TAKE ONE CAPSULE BY MOUTH TWICE A DAY) 60 capsule 2  . spironolactone (ALDACTONE) 25 MG tablet TAKE ONE TABLET EACH DAY (Patient taking differently: TAKE ONE TABLET BY MOUTH EACH DAY) 30 tablet 3  . warfarin (COUMADIN) 2.5 MG tablet TAKE AS DIRECTED BY COUMADIN CLINIC 30 tablet 1   No current facility-administered medications for this visit.    Allergies:   Hydrocodone and Oxycodone   Social History:  The patient  reports that he quit smoking about 50 years ago. His smoking use included Cigarettes. He has a 4 pack-year smoking history. He has never used smokeless tobacco. He  reports that he drinks about 9.0 oz of alcohol per week. He reports that he does not use illicit drugs.   Family History:  The patient's family history includes Heart attack in his brother, father, and maternal uncle; Heart disease in his maternal uncle. There is no history of Colon cancer, Esophageal cancer, Rectal cancer, Prostate cancer, or Stomach cancer.    ROS:  Please see the history of present illness.   All other systems are reviewed and negative.    PHYSICAL EXAM: VS:  BP 122/72 mmHg  Pulse 65  Ht 6\' 1"  (1.854 m)  Wt 206 lb 9.6 oz (93.713 kg)  BMI 27.26 kg/m2 , BMI Body mass index is 27.26 kg/(m^2). GEN: Well nourished, well developed, in no acute distress HEENT: normal Neck: no JVD, carotid bruits, or masses Cardiac: RRR; no murmurs, rubs, or gallops,no edema  Respiratory:  clear to auscultation bilaterally, normal work of breathing GI: soft, nontender, nondistended, + BS MS: no deformity or atrophy Skin: warm and dry, device pocket is well healed Neuro:  Strength and sensation are intact Psych: euthymic mood, full affect  EKG:  EKG is ordered today. The ekg ordered today shows AV  pacing  Device interrogation is reviewed today in detail.  See PaceArt for details.   Recent Labs: 11/13/2013: ALT 31; Hemoglobin 12.6*; Platelets 219.0; Potassium 4.6; Sodium 140; TSH 2.12 12/25/2013: BUN 17; Creatinine 1.2    Lipid Panel     Component Value Date/Time   CHOL 161 11/13/2013 1032   TRIG 129.0 11/13/2013 1032   HDL 55.50 11/13/2013 1032   CHOLHDL 3 11/13/2013 1032   VLDL 25.8 11/13/2013 1032   LDLCALC 80 11/13/2013 1032     Wt Readings from Last 3 Encounters:  05/01/14 206 lb 9.6 oz (93.713 kg)  04/02/14 203 lb 3.2 oz (92.171 kg)  01/28/14 201 lb (91.173 kg)      Other studies Reviewed: Additional studies/ records that were reviewed today include: Dr Ronnald Ramp' last note   ASSESSMENT AND PLAN:   1.  Chronic systolic dysfunction euvolemic today Stable on an appropriate medical regimen Normal BiV ICD function Followed in the ICM device clinic See Pace Art report No changes today EF has improved to 35-40% with CRT The importance of ETOH reduction and weight loss were discussed today.  2. ETOH Cessation advised He is not ready to quit but has cut back  3. HTN Stable No change required today bmet today  4. AFib CHADS2VASC score is at least 6.  I would therefore favor lifelong anticoagulation Maintaining sinus rhythm with amiodarone 100mg  daily.  Consider reducing this further in the future. Check LFTs/TFTs today  Merlin Return to see Cecille Rubin in 6 months I will see in a year  Current medicines are reviewed at length with the patient today.   The patient does not have concerns regarding his medicines.  The following changes were made today:  none  Signed, Thompson Grayer, MD  05/01/2014 11:30 AM     [facility] [address] [place] [address] [phone] (office) [phone] (fax)

## 2014-05-01 NOTE — Patient Instructions (Signed)
Your physician wants you to follow-up in: 6 months Truitt Merle, NP and 12 months with Dr Vallery Ridge will receive a reminder letter in the mail two months in advance. If you don't receive a letter, please call our office to schedule the follow-up appointment.  Your physician recommends that you return for lab work today: TSH,FreeT4, BMP,Liver

## 2014-05-01 NOTE — Addendum Note (Signed)
Addended by: Janan Halter F on: 05/01/2014 11:43 AM   Modules accepted: Orders

## 2014-05-03 ENCOUNTER — Other Ambulatory Visit: Payer: Self-pay | Admitting: Internal Medicine

## 2014-05-06 ENCOUNTER — Encounter: Payer: Self-pay | Admitting: Internal Medicine

## 2014-05-06 NOTE — Telephone Encounter (Signed)
Please advise on refill request

## 2014-05-06 NOTE — Telephone Encounter (Signed)
Ok to refill x 6 months 

## 2014-05-07 ENCOUNTER — Encounter: Payer: Self-pay | Admitting: Internal Medicine

## 2014-05-08 NOTE — Telephone Encounter (Signed)
Spoke with pharmacy and gave verbal refill auth.

## 2014-05-15 ENCOUNTER — Ambulatory Visit (INDEPENDENT_AMBULATORY_CARE_PROVIDER_SITE_OTHER): Payer: Medicare Other

## 2014-05-15 DIAGNOSIS — J309 Allergic rhinitis, unspecified: Secondary | ICD-10-CM

## 2014-05-16 ENCOUNTER — Other Ambulatory Visit: Payer: Self-pay | Admitting: Internal Medicine

## 2014-05-19 ENCOUNTER — Encounter: Payer: Self-pay | Admitting: Internal Medicine

## 2014-05-19 ENCOUNTER — Telehealth: Payer: Self-pay | Admitting: *Deleted

## 2014-05-19 ENCOUNTER — Ambulatory Visit (INDEPENDENT_AMBULATORY_CARE_PROVIDER_SITE_OTHER): Payer: Medicare Other | Admitting: *Deleted

## 2014-05-19 DIAGNOSIS — I480 Paroxysmal atrial fibrillation: Secondary | ICD-10-CM

## 2014-05-19 DIAGNOSIS — Z5181 Encounter for therapeutic drug level monitoring: Secondary | ICD-10-CM

## 2014-05-19 DIAGNOSIS — I4891 Unspecified atrial fibrillation: Secondary | ICD-10-CM

## 2014-05-19 DIAGNOSIS — Z7901 Long term (current) use of anticoagulants: Secondary | ICD-10-CM | POA: Diagnosis not present

## 2014-05-19 LAB — POCT INR: INR: 1.8

## 2014-05-19 MED ORDER — WARFARIN SODIUM 2.5 MG PO TABS: 2.5000 mg | ORAL_TABLET | ORAL | Status: DC

## 2014-05-19 NOTE — Telephone Encounter (Signed)
Received a refill request on patient's Coumadin/Warfarin medication. Patient is overdue for INR recheck.  Left a message on voicemail to return a call to schedule an appointment.

## 2014-05-21 ENCOUNTER — Ambulatory Visit (INDEPENDENT_AMBULATORY_CARE_PROVIDER_SITE_OTHER)
Admission: RE | Admit: 2014-05-21 | Discharge: 2014-05-21 | Disposition: A | Discharge status: Home / Self Care | Payer: Medicare Other | Source: Ambulatory Visit | Attending: Internal Medicine | Admitting: Internal Medicine

## 2014-05-21 ENCOUNTER — Encounter: Payer: Self-pay | Admitting: Internal Medicine

## 2014-05-21 ENCOUNTER — Ambulatory Visit: Payer: Medicare Other | Admitting: Internal Medicine

## 2014-05-21 ENCOUNTER — Other Ambulatory Visit (INDEPENDENT_AMBULATORY_CARE_PROVIDER_SITE_OTHER): Payer: Medicare Other

## 2014-05-21 ENCOUNTER — Telehealth: Payer: Self-pay | Admitting: Internal Medicine

## 2014-05-21 VITALS — BP 126/78 | HR 70 | Ht 72.0 in | Wt 207.6 lb

## 2014-05-21 DIAGNOSIS — R079 Chest pain, unspecified: Secondary | ICD-10-CM

## 2014-05-21 DIAGNOSIS — G4733 Obstructive sleep apnea (adult) (pediatric): Secondary | ICD-10-CM

## 2014-05-21 DIAGNOSIS — R918 Other nonspecific abnormal finding of lung field: Secondary | ICD-10-CM | POA: Diagnosis not present

## 2014-05-21 LAB — HEPATIC FUNCTION PANEL
ALK PHOS: 68 U/L (ref 39–117)
ALT: 25 U/L (ref 0–53)
AST: 30 U/L (ref 0–37)
Albumin: 4 g/dL (ref 3.5–5.2)
Bilirubin, Direct: 0.1 mg/dL (ref 0.0–0.3)
Total Bilirubin: 0.5 mg/dL (ref 0.2–1.2)
Total Protein: 7 g/dL (ref 6.0–8.3)

## 2014-05-21 NOTE — Progress Notes (Signed)
11/01/10-79 year old male with remote smoking history, followed for chronic asthmatic bronchitis/bronchiectasis, history of sinusitis, allergic rhinitis, sleep apnea Last here 03/15/10 He wants to wait until November for flu shot. He has not had recent or acute symptoms related to his breathing and has not used Advair in 6 months. He didn't find it helpful. Rare use of Proair, maybe once every 3-4 months. He continues allergy vaccine, missing a dose sometimes if he is away from town. He notices no problems and offers no questions. He continues to use CPAP all night every night. He had had a palatoplasty he doesn't remember when and wonders if this was done at the time of his tonsillectomy as a Demontre Padin child. He notices persistent narrowness through the right nostril related to his septal deviation. Nasonex helps. We discussed a trial of Breathe Right strips to be used under his CPAP mask. He can feel when his atrial fibrillation is more active, as in the past 2 days. He treats his difficulty initiating and maintaining sleep with a one half of a 5 mg Ambien tablet when needed.   11/01/10-79 year old male with remote smoking history, followed for chronic asthmatic bronchitis/bronchiectasis, history of sinusitis, allergic rhinitis, sleep apnea Walked in today as an acute unscheduled visit complaining of cough since lunchtime. He got a throat tickle walking to his car and got gradually worse. Persistent for 4 hours with chest congestion. Chronic feeling of raspy throat clearing. Orthopedic knee surgery one month ago/Dr. Tonita Cong. Oxycodone given in hospital cause shaking, insomnia, panic attack. When he got home he took some leftover hydrocodone and had the same experience, up all night, pacing and anxious. This is set off his chronic anxiety. Dr Tonita Cong called in lorazepam- helps. Continues CPAP all night every night.  05/16/11- 79 year old male with remote smoking history, followed for chronic asthmatic  bronchitis/bronchiectasis, history of sinusitis, allergic rhinitis, sleep apnea, AFib/ CAD/ MI, GERD He admits his anxiety is getting to him. 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 Apparently he was put to sleep with intention to remove the lesion but they found they could not. He is left persistently hoarse.  He had just had back surgery 8 weeks previously and has been taking oxycodone for back pain. Lorazepam helps him sleep at night.  CPAP continues all night every night with good control. 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  11/03/11- 79 year old male with remote smoking history, followed for chronic asthmatic bronchitis/bronchiectasis, history of sinusitis, allergic rhinitis, sleep apnea, AFib/ CAD/ MI, GERD He wants to wait until November for flu shot. Continues allergy vaccine 1:10 GH. He has not had much respiratory problem until the last one or 2 weeks when he began throat clearing. Has used his rescue inhaler only once or twice. In February he had spine surgery for spinal stenosis. Oxycodone caused panic attack lasting 3 months. Then had a vocal cord biopsy at [facility]. Exacerbation of atrial fibrillation treated with pacemaker in June, then amiodarone. He still feels more short of breath than normal but is starting cardiac rehabilitation. CPAP/ Advanced.  12/26/11-  79 year old male with remote smoking history, followed for chronic asthmatic bronchitis/bronchiectasis, history of sinusitis, allergic rhinitis, sleep apnea, AFib/ CAD/ MI, GERD     Wife here FOLLOWS FOR: on last dose of Amoxicillin(dx'd with PNA at hospital 12-16-11; feels somewhat better but not 100%.  Still on allergy vaccine. Acute  illness is now improving. Antibiotic ends today. No fever, minimal phlegm, throat tickle, shortness of breath with sustained speech. Denies cough or wheeze. CXR  12/18/11- reviewed IMPRESSION:  Improved right lower lobe airspace disease. Small right effusion  noted.  Original Report Authenticated By: Orlean Patten, M.D.    03/29/2012 Acute OV  Complains of sneezing, runny nose with clear drainage, watery eyes, increased saliva x 6 months .  Has multiple complaints since surgery last year for pacemaker and back surgery.  Has significant drippy nose and post nasal drainage. Mainly clear drainage. Has to clear throat.  Has dry cough on/off  Does gets choked on saliva and thin liquids.  No significant dysphagia at present. Marland Kitchen Has had previous esophageal dilatation.  Has excessive saliva intermittently.  Gets weekly allergy shots.  Started on Dymista -did not use but once because had a headache.  No discolored mucus, fever, chest pain or edema.  >>rec to stop ACE , rx claritin   04/30/2012 Acute OV  Complains of prod cough with yellow mucus, increased SOB x 8 days.  Recently on vacation on cruise.  Patient reports that he started coughing. Prior to beginning his vacation. And cough is progressively gotten worse. He's been taking over-the-counter cold products, and Tylenol without much relief. He denies any hemoptysis, orthopnea, chest pain, palpitations, syncope Patient was recommended to have his ACE inhibitor  Change at last visit. However, patient reports that his doctor does not feel like this is causing his cough and his dose was increased.  05/16/12- 79 year old male with remote smoking history, followed for chronic asthmatic bronchitis/bronchiectasis, history of sinusitis, allergic rhinitis, sleep apnea, AFib/ CAD/ MI, GERD   FOLLOWS FOR: still on vaccine and doing well; no major flare ups at this time. Describes pain in the left mid back intermittent x10 years. His primary physician has thought it was arthritis and referred for physical therapy which starts tomorrow. History of spinal stenosis surgery. He had an acute bronchitis at last office  visit, responding to Augmentin, prednisone and Mucinex DM. He continues allergy vaccine 1:10 GH. CXR 05/01/12 *RADIOLOGY REPORT*  Clinical Data: Cough and shortness of breath.  CHEST - 2 VIEW  Comparison: 01/17/2012  Findings: Stable appearance of the left cardiac ICD leads. The  lungs are clear without airspace disease or edema. Stable  appearance of the heart and mediastinum. Stable densities or  calcifications in the left mid lung region.  IMPRESSION:  No active cardiopulmonary disease.  Original Report Authenticated By: Markus Daft, M.D.  11/19/12- 79 year old male with remote smoking history, followed for chronic asthmatic bronchitis/bronchiectasis, history of sinusitis, allergic rhinitis, OSA/ CPAP/ UPPP, AFib/ CAD/ MI, GERD   FOLLOWS FOR: still on vaccine-has missed 2 weeks due to traveling. DME is [facility]. Wears CPAP every night for about 8-9 hours; pressure working well for patient. Okay with CPAP/[facility] 5-15. Old machine has burned out and needs replacement Continues allergy vaccine 1:10 GH  05/20/13- 79 year old male with remote smoking history, followed for chronic asthmatic bronchitis/bronchiectasis, history of sinusitis, allergic rhinitis, OSA/ CPAP/ UPPP, AFib/ CAD/ MI, GERD   FOLLOWS FOR: Breathing is doing well, no concerns.  Doing well on allergy vaccine 1:10 GH.  Wearing CPAP Auto 5-15/ Advanced 8 hours per night. Old CPAP machine works fine. He got a new machine but never got used to it so he returned to his old one.  11/18/13- 79 year old male with remote smoking history, followed for chronic asthmatic bronchitis/bronchiectasis, history of sinusitis, allergic rhinitis, OSA/ CPAP/ UPPP, complicated  by  AFib/ CAD/ MI, GERD, long hx anxiety/ depression Wife here FOLLOW FOR: OSA; wears CPAP 6CM/ Advanced every night, 10-12 hours nightly.  Not feeling rested.  Neurologist and  PCP both believe that pt is depressed due to inability to sleep well. Mask not fit properly. He is  using old machine- never got his new one adjusted.  Wife says he has early Alzheimers. He lies in bed 10-11 hours, frequently awake after ambien 1/2 x 5 mg.  05/22/14- 79 year old male with remote smoking history, followed for chronic asthmatic bronchitis/bronchiectasis, history of sinusitis, allergic rhinitis, OSA/ CPAP/ UPPP, complicated by  AFib/ CAD/ MI, GERD, long hx anxiety/ depression Wife here FOLLOWS FOR: Wears CPAP 6/ Advanced every night for about 8-10 hours(states his heart dr gets the readings for his CPAP machine; still on vaccine and doing well.    ROS-see HPI Constitutional:   No-   weight loss, night sweats, fevers, chills, fatigue, lassitude. HEENT:   No-  headaches, difficulty swallowing, tooth/dental problems, sore throat,       No-  sneezing, itching, ear ache, nasal congestion, post nasal drip,  CV:  No-   chest pain, orthopnea, PND, swelling in lower extremities, anasarca,  dizziness, palpitations Resp: No-   shortness of breath with exertion or at rest.              No-   productive cough,  No non-productive cough,  No- coughing up of blood.              No-   change in color of mucus.  No- wheezing.   Skin: No-   rash or lesions. GI:  No-   heartburn, indigestion, abdominal pain, nausea, vomiting, GU:  MS:  No-   joint pain or swelling.  .  + back pain. Neuro-     nothing unusual Psych:  No- change in mood or affect. +depression or anxiety.  No memory loss.  OBJ- Physical Exam General- Alert, Oriented, Affect-appropriate, Distress- none acute Skin- rash-none, lesions- none, excoriation- none.  Lymphadenopathy- none Head- atraumatic            Eyes- Gross vision intact, PERRLA, conjunctivae and secretions clear            Ears- Hearing, canals-normal            Nose- Clear, no-Septal dev, mucus, polyps, erosion, perforation             Throat- + UPPP/ Mallampati II ,                         tonsils- atrophic Neck- flexible , trachea midline, no stridor , thyroid  nl, carotid no bruit Chest - symmetrical excursion , unlabored           Heart/CV- RRR , no murmur , no gallop  , no rub, nl s1 s2                           - JVD- none , edema- none, stasis changes- none, varices- none           Lung- clear, wheeze- none, cough- none , dullness-none, rub- none           Chest wall- L pacemaker Abd- Br/ Gen/ Rectal- Not done, not indicated Extrem- cyanosis- none, clubbing, none, atrophy- none, strength- nl Neuro- grossly intact to observation

## 2014-05-21 NOTE — Patient Instructions (Addendum)
Order- CXR   Dx Right anterior chest pain  Order- hepatic function panel  Dx abdominal pain, right upper quadrant  Order- download result from [facility] for pressure compliance dx OSA  We can continue allergy vaccine 1:10 GH

## 2014-05-21 NOTE — Telephone Encounter (Signed)
Spoke with patient and he saw Dr. [name] today and was told to see GI. At 2:00 PM yesterday after he ate lunch, he had a small bowel movement. States he also had burping and right side abdominal pain. States he is still burping more today. Scheduled with Tye Savoy, NP on 05/28/14 at 2:30 PM.

## 2014-05-22 ENCOUNTER — Telehealth: Payer: Self-pay | Admitting: Internal Medicine

## 2014-05-22 ENCOUNTER — Other Ambulatory Visit (INDEPENDENT_AMBULATORY_CARE_PROVIDER_SITE_OTHER): Payer: Medicare Other

## 2014-05-22 ENCOUNTER — Encounter: Payer: Self-pay | Admitting: Internal Medicine

## 2014-05-22 ENCOUNTER — Ambulatory Visit (INDEPENDENT_AMBULATORY_CARE_PROVIDER_SITE_OTHER): Payer: Medicare Other

## 2014-05-22 ENCOUNTER — Ambulatory Visit (INDEPENDENT_AMBULATORY_CARE_PROVIDER_SITE_OTHER): Payer: Medicare Other | Admitting: Internal Medicine

## 2014-05-22 ENCOUNTER — Ambulatory Visit: Payer: Medicare Other | Admitting: Internal Medicine

## 2014-05-22 VITALS — BP 128/82 | HR 65 | Temp 98.2°F | Ht 72.0 in | Wt 208.0 lb

## 2014-05-22 DIAGNOSIS — K2100 Gastro-esophageal reflux disease with esophagitis, without bleeding: Secondary | ICD-10-CM

## 2014-05-22 DIAGNOSIS — I255 Ischemic cardiomyopathy: Secondary | ICD-10-CM | POA: Diagnosis not present

## 2014-05-22 DIAGNOSIS — R1084 Generalized abdominal pain: Secondary | ICD-10-CM | POA: Diagnosis not present

## 2014-05-22 DIAGNOSIS — J309 Allergic rhinitis, unspecified: Secondary | ICD-10-CM

## 2014-05-22 NOTE — Progress Notes (Signed)
   Subjective:    Patient ID: Corey [name], male    DOB: Sep 27, 1935, 79 y.o.   MRN: [id number]  HPI His symptoms began 05/20/14 as discomfort in the right mid to lateral abdomen approximately 2:30 PM, 60-90 minutes after eating lunch. It is intermittent and cramping. It can radiate into the epigastric area. With inspiration the pain is accentuated in the epigastric area. He states that since the onset of the pain he's been burping for the last 2.5 days. Tylenol has been of some benefit. He has a history of esophageal stricture.   Unrelated is a history of very hard stool and constipation. With the present illness he states he has to push on the upper abdomen to induce a bowel movement. He's had the constipation for at least 3 years. Rarely he will note blood in stool. Occasionally he has noted black stool. These have not occurred with the present illness.  He'll have 1-2 alcoholic beverages a day. He denies ingestion of nonsteroidals, peppermint, nicotine, or excess caffeine. He will have 1 serving of tea daily.     Review of Systems He denies fever, chills, or sweats. He has no nausea or vomiting. He's had no hematemesis.No dysphagia with present illness.    Objective:   Physical Exam Pertinent or positive findings include: Alopecia is present.  Uvula absent.( He states he had a uvulectomy done at age 89 with his tonsilectomy. He does have a history of sleep apnea) There is some tenderness in the epigastrium to palpation as well as over the midabdomen.  There is slight tenting of the skin.  He has crepitus of the knees, left greater than right.  General appearance :adequately nourished; in no distress. Eyes: No conjunctival inflammation or scleral icterus is present. Oral exam:  Lips and gums are healthy appearing.There is no oropharyngeal erythema or exudate noted. Dental hygiene is good. Heart:  Normal rate and regular rhythm. S1 and S2 normal without gallop, murmur, click, rub or  other extra sounds  Lungs:Chest clear to auscultation; no wheezes, rhonchi,rales ,or rubs present.No increased work of breathing.  Abdomen: bowel sounds normal, soft without masses, organomegaly or hernias noted.  No guarding or rebound. Vascular : all pulses equal ; no bruits present. Skin:Warm & dry.  Intact without suspicious lesions or rashes ; no  jaundice  Lymphatic: No lymphadenopathy is noted about the head, neck, axilla areas.  Neuro: Strength, tone & DTRs normal.Flat affect. "La belle indifference " suggested.        Assessment & Plan:  #1 diffuse abdominal pain #2 GERD; PMH of stricture See orders & AVS

## 2014-05-22 NOTE — Telephone Encounter (Signed)
Called and spoke to pt. Pt requesting results of CXR and labs. Informed pt of the results per CY. Pt verbalized understanding and denied any further questions or concerns at this time.   Notes Recorded by Deneise Lever, MD on 05/21/2014 at 9:22 PM CXR- no obvious problem to explain the R sided pain. There are slightly increased markings in the left base, which could be simple crowding or a very minimal pneumonia. Let us know if feeling sicker with fever or infected-looking sputum.  Notes Recorded by Deneise Lever, MD on 05/21/2014 at 9:18 PM Lab- normal liver function

## 2014-05-22 NOTE — Patient Instructions (Signed)
Reflux of gastric acid may be asymptomatic as this may occur mainly during sleep.The triggers for reflux  include stress; the "aspirin family" ; alcohol; peppermint; and caffeine (coffee, tea, cola, and chocolate). The aspirin family would include aspirin and the nonsteroidal agents such as ibuprofen &  Naproxen. Tylenol would not cause reflux. If having symptoms ; food & drink should be avoided for @ least 2 hours before going to bed.  Take the protein pump inhibitor Pantoprazole 30 minutes before breakfast and 30 minutes before the evening meal .  Stay on clear liquids for 48-72 hours or until pain resolves.This would include  jello, sherbert (NOT ice cream), Lipton's chicken noodle soup(NOT cream based soups),Gatorade Lite, flat Ginger ale (without High Fructose Corn Syrup),dry toast or crackers, baked potato.No milk , dairy or grease until bowels are formed.  Report increasing pain, fever or rectal bleeding   Your next office appointment will be determined based upon review of your pending labs Those instructions will be transmitted to you by mail for your records. Critical results will be called. Followup as needed for any active or acute issue. Please report any significant change in your symptoms.

## 2014-05-22 NOTE — Progress Notes (Signed)
Pre visit review using our clinic review tool, if applicable. No additional management support is needed unless otherwise documented below in the visit note. 

## 2014-05-23 ENCOUNTER — Other Ambulatory Visit: Payer: Self-pay | Admitting: Internal Medicine

## 2014-05-23 DIAGNOSIS — D6489 Other specified anemias: Secondary | ICD-10-CM

## 2014-05-23 DIAGNOSIS — R1010 Upper abdominal pain, unspecified: Secondary | ICD-10-CM

## 2014-05-23 LAB — CBC WITH DIFFERENTIAL/PLATELET
Basophils Absolute: 0 10*3/uL (ref 0.0–0.1)
Basophils Relative: 0.6 % (ref 0.0–3.0)
Eosinophils Absolute: 0.2 10*3/uL (ref 0.0–0.7)
Eosinophils Relative: 3.5 % (ref 0.0–5.0)
HEMATOCRIT: 33.4 % — AB (ref 39.0–52.0)
Hemoglobin: 11.3 g/dL — ABNORMAL LOW (ref 13.0–17.0)
LYMPHS ABS: 1.7 10*3/uL (ref 0.7–4.0)
Lymphocytes Relative: 32.3 % (ref 12.0–46.0)
MCHC: 33.8 g/dL (ref 30.0–36.0)
MCV: 80.9 fl (ref 78.0–100.0)
MONOS PCT: 9.5 % (ref 3.0–12.0)
Monocytes Absolute: 0.5 10*3/uL (ref 0.1–1.0)
Neutro Abs: 2.8 10*3/uL (ref 1.4–7.7)
Neutrophils Relative %: 54.1 % (ref 43.0–77.0)
Platelets: 233 10*3/uL (ref 150.0–400.0)
RBC: 4.13 Mil/uL — ABNORMAL LOW (ref 4.22–5.81)
RDW: 15.5 % (ref 11.5–15.5)
WBC: 5.3 10*3/uL (ref 4.0–10.5)

## 2014-05-23 LAB — LIPASE: Lipase: 25 U/L (ref 11.0–59.0)

## 2014-05-23 LAB — AMYLASE: Amylase: 29 U/L (ref 27–131)

## 2014-05-23 NOTE — Progress Notes (Signed)
Pt is already scheduled w/Paula Guenther on 05/28/14. Is that soon enough?

## 2014-05-28 ENCOUNTER — Other Ambulatory Visit (INDEPENDENT_AMBULATORY_CARE_PROVIDER_SITE_OTHER): Payer: Medicare Other

## 2014-05-28 ENCOUNTER — Encounter: Payer: Self-pay | Admitting: Nurse Practitioner

## 2014-05-28 ENCOUNTER — Ambulatory Visit (INDEPENDENT_AMBULATORY_CARE_PROVIDER_SITE_OTHER): Payer: Medicare Other | Admitting: Nurse Practitioner

## 2014-05-28 VITALS — BP 114/70 | HR 68 | Ht 72.0 in | Wt 207.0 lb

## 2014-05-28 DIAGNOSIS — R1011 Right upper quadrant pain: Secondary | ICD-10-CM

## 2014-05-28 DIAGNOSIS — D649 Anemia, unspecified: Secondary | ICD-10-CM

## 2014-05-28 DIAGNOSIS — I255 Ischemic cardiomyopathy: Secondary | ICD-10-CM

## 2014-05-28 DIAGNOSIS — Z79899 Other long term (current) drug therapy: Secondary | ICD-10-CM | POA: Diagnosis not present

## 2014-05-28 LAB — CBC
HCT: 33 % — ABNORMAL LOW (ref 39.0–52.0)
HEMOGLOBIN: 11.1 g/dL — AB (ref 13.0–17.0)
MCHC: 33.5 g/dL (ref 30.0–36.0)
MCV: 80.8 fl (ref 78.0–100.0)
PLATELETS: 227 10*3/uL (ref 150.0–400.0)
RBC: 4.09 Mil/uL — AB (ref 4.22–5.81)
RDW: 15.9 % — AB (ref 11.5–15.5)
WBC: 4.1 10*3/uL (ref 4.0–10.5)

## 2014-05-28 LAB — IBC PANEL
Iron: 41 ug/dL — ABNORMAL LOW (ref 42–165)
SATURATION RATIOS: 8.3 % — AB (ref 20.0–50.0)
Transferrin: 353 mg/dL (ref 212.0–360.0)

## 2014-05-28 LAB — FERRITIN: FERRITIN: 11 ng/mL — AB (ref 22.0–322.0)

## 2014-05-28 LAB — VITAMIN B12: VITAMIN B 12: 215 pg/mL (ref 211–911)

## 2014-05-28 LAB — FOLATE

## 2014-05-28 NOTE — Patient Instructions (Addendum)
Your physician has requested that you go to the basement for lab work before leaving today  You have been scheduled for an abdominal ultrasound at [facility] (1st floor of hospital) on 05-30-14 at 7:30am. Please arrive 15 minutes prior to your appointment for registration. Make certain not to have anything to eat or drink 6 hours prior to your appointment. Should you need to reschedule your appointment, please contact radiology at [phone]. This test typically takes about 30 minutes to perform.

## 2014-05-28 NOTE — Progress Notes (Signed)
     History of Present Illness:   Patient is a 79 year old male known to Dr. Olevia Perches. He has a history of adenomatous colon polyps as well as severe diverticulosis. Patient has multiple medical problems not limited to CHF, nonischemic cardiomyopathy, pacemaker/ICD placement, history of atrial fibrillation, TIA and coronary artery disease. Patient had an upper endoscopy December 2015 for evaluation of abnormal gastric findings on CT scan. EGD showed a large friable pedunculated polyp in the gastric cardia. TPolyp removed. Biopsies compatible with ulcerated hyperplastic gastric polyp. Stain negative for H. pylori.   Patient referred by PCP for abdominal pain. Several days ago, a couple of hours after lunch patient developed acute right mid abdominal pain radiating up into epigastrium. Pain associated with excessive belching.. Patient saw PCP for the pain. CXR 05/21/14 suggested left atelectasis or infiltrate / PNA. Amylase, lipase, LFT normal. WBC normal. Hgb down a gram to 11.3 since late October 2015.  Pain was constant the first couple of days then it became intermittent and finally abated on Friday ( 8 days ago)  Current Medications, Allergies, Past Medical History, Past Surgical History, Family History and Social History were reviewed in [employer] record.   Physical Exam: General: Pleasant, well developed , white male in no acute distress Head: Normocephalic and atraumatic Eyes:  sclerae anicteric, conjunctiva pink  Ears: Normal auditory acuity Lungs: Clear throughout to auscultation Heart: Regular rate and rhythm Abdomen: Soft, non distended, non-tender. No masses, no hepatomegaly. Normal bowel sounds Rectal: heme negative brown stool Musculoskeletal: Symmetrical with no gross deformities  Extremities: No edema  Neurological: Alert oriented x 4, grossly nonfocal Psychological:  Alert and cooperative. Normal mood and affect  Assessment and Recommendations:  70.  79 year old male with acute RUQ pain lasting a few days, now resolved. He is s/p remote cholecystectomy. Etiology of pain unclear. Labs were unrevealing. Will get an ultrasound and call him with results.   2. Normocytic anemia on coumadin. Baseline hgb seems to be mid 12 range, it is down about a gram. Heme negative today. No overt GI bleeding at home. Of note, patient was iron deficient back in late October 2015 but that could have been secondary to blood loss from a large hyperplastic gastric polyp which was removed December 2015. Will repeat CBC to see what hgb is doing. Check iron studies as well. He may need repeat EGD. Patient had a complete colonoscopy Jan 2013. No polyps seen so hopefully no need to to repeat study..  3. Severe diverticular disase.   Cc. Unice Cobble, Md

## 2014-05-29 ENCOUNTER — Ambulatory Visit (INDEPENDENT_AMBULATORY_CARE_PROVIDER_SITE_OTHER): Payer: Medicare Other

## 2014-05-29 DIAGNOSIS — J309 Allergic rhinitis, unspecified: Secondary | ICD-10-CM | POA: Diagnosis not present

## 2014-05-30 ENCOUNTER — Ambulatory Visit (HOSPITAL_COMMUNITY)
Admission: RE | Admit: 2014-05-30 | Discharge: 2014-05-30 | Disposition: A | Discharge status: Home / Self Care | Payer: Medicare Other | Source: Ambulatory Visit | Attending: Nurse Practitioner | Admitting: Nurse Practitioner

## 2014-05-30 DIAGNOSIS — R1011 Right upper quadrant pain: Secondary | ICD-10-CM

## 2014-05-30 DIAGNOSIS — Z8546 Personal history of malignant neoplasm of prostate: Secondary | ICD-10-CM | POA: Diagnosis not present

## 2014-05-30 DIAGNOSIS — D649 Anemia, unspecified: Secondary | ICD-10-CM

## 2014-05-30 DIAGNOSIS — R109 Unspecified abdominal pain: Secondary | ICD-10-CM | POA: Diagnosis present

## 2014-05-30 DIAGNOSIS — K760 Fatty (change of) liver, not elsewhere classified: Secondary | ICD-10-CM | POA: Diagnosis not present

## 2014-05-31 NOTE — Progress Notes (Signed)
Reviewed and agree. With sono RUQ, also check LFT's, lipase

## 2014-06-02 ENCOUNTER — Telehealth: Payer: Self-pay | Admitting: Nurse Practitioner

## 2014-06-02 ENCOUNTER — Ambulatory Visit (INDEPENDENT_AMBULATORY_CARE_PROVIDER_SITE_OTHER): Payer: Medicare Other | Admitting: *Deleted

## 2014-06-02 ENCOUNTER — Encounter: Payer: Self-pay | Admitting: *Deleted

## 2014-06-02 ENCOUNTER — Other Ambulatory Visit: Payer: Self-pay

## 2014-06-02 DIAGNOSIS — D509 Iron deficiency anemia, unspecified: Secondary | ICD-10-CM

## 2014-06-02 DIAGNOSIS — I5022 Chronic systolic (congestive) heart failure: Secondary | ICD-10-CM | POA: Diagnosis not present

## 2014-06-02 DIAGNOSIS — Z95810 Presence of automatic (implantable) cardiac defibrillator: Secondary | ICD-10-CM

## 2014-06-02 NOTE — Telephone Encounter (Signed)
Phone lines are busy.  I will continue to try and reach the patient

## 2014-06-02 NOTE — Progress Notes (Signed)
EPIC Encounter for ICM Monitoring  Patient Name: Corey [name] is a 79 y.o. male Date: 06/02/2014 Primary Care Physican: Scarlette Calico, MD Primary Cardiologist: Allred Electrophysiologist: Allred Dry Weight: 200 lbs  Bi-V pacing: >99%       In the past month, have you:  1. Gained more than 2 pounds in a day or more than 5 pounds in a week? No. He will occasionally see his weight trend up to 203 lbs, but will come back down to 200 lbs.   2. Had changes in your medications (with verification of current medications)? no  3. Had more shortness of breath than is usual for you? no  4. Limited your activity because of shortness of breath? no  5. Not been able to sleep because of shortness of breath? no  6. Had increased swelling in your feet or ankles? no  7. Had symptoms of dehydration (dizziness, dry mouth, increased thirst, decreased urine output) no  8. Had changes in sodium restriction? no  9. Been compliant with medication? Yes   ICM trend:   Follow-up plan: ICM clinic phone appointment: 07/03/14. The patient's impedence was below baseline from ~ 4/17-5/5. He did not notice any real change in his weight or symptoms during this time. No changes made today.   Copy of note sent to patient's primary care physician, primary cardiologist, and device following physician.  Alvis Lemmings, RN, BSN 06/02/2014 12:51 PM

## 2014-06-02 NOTE — Telephone Encounter (Signed)
Patient is notified of the ultrasound and lab results.  He will call back for any additional questions or concerns

## 2014-06-03 ENCOUNTER — Other Ambulatory Visit: Payer: Self-pay | Admitting: *Deleted

## 2014-06-03 DIAGNOSIS — D509 Iron deficiency anemia, unspecified: Secondary | ICD-10-CM

## 2014-06-04 ENCOUNTER — Encounter: Payer: Self-pay | Admitting: *Deleted

## 2014-06-04 ENCOUNTER — Other Ambulatory Visit: Payer: Self-pay | Admitting: *Deleted

## 2014-06-04 ENCOUNTER — Ambulatory Visit (INDEPENDENT_AMBULATORY_CARE_PROVIDER_SITE_OTHER): Payer: Medicare Other | Admitting: *Deleted

## 2014-06-04 DIAGNOSIS — Z5181 Encounter for therapeutic drug level monitoring: Secondary | ICD-10-CM

## 2014-06-04 DIAGNOSIS — D649 Anemia, unspecified: Secondary | ICD-10-CM

## 2014-06-04 DIAGNOSIS — Z7901 Long term (current) use of anticoagulants: Secondary | ICD-10-CM

## 2014-06-04 DIAGNOSIS — I4891 Unspecified atrial fibrillation: Secondary | ICD-10-CM

## 2014-06-04 DIAGNOSIS — I480 Paroxysmal atrial fibrillation: Secondary | ICD-10-CM

## 2014-06-04 LAB — POCT INR: INR: 1.6

## 2014-06-05 ENCOUNTER — Ambulatory Visit (INDEPENDENT_AMBULATORY_CARE_PROVIDER_SITE_OTHER): Payer: Medicare Other

## 2014-06-05 ENCOUNTER — Other Ambulatory Visit: Payer: Self-pay | Admitting: Internal Medicine

## 2014-06-05 DIAGNOSIS — J309 Allergic rhinitis, unspecified: Secondary | ICD-10-CM

## 2014-06-06 ENCOUNTER — Telehealth: Payer: Self-pay | Admitting: Internal Medicine

## 2014-06-06 NOTE — Telephone Encounter (Signed)
A user error has taken place.

## 2014-06-09 ENCOUNTER — Telehealth: Payer: Self-pay | Admitting: Pharmacist

## 2014-06-09 ENCOUNTER — Ambulatory Visit (INDEPENDENT_AMBULATORY_CARE_PROVIDER_SITE_OTHER): Payer: Medicare Other | Admitting: *Deleted

## 2014-06-09 ENCOUNTER — Ambulatory Visit (AMBULATORY_SURGERY_CENTER): Payer: Self-pay | Admitting: *Deleted

## 2014-06-09 VITALS — Ht 73.0 in | Wt 211.6 lb

## 2014-06-09 DIAGNOSIS — Z5181 Encounter for therapeutic drug level monitoring: Secondary | ICD-10-CM

## 2014-06-09 DIAGNOSIS — Z85828 Personal history of other malignant neoplasm of skin: Secondary | ICD-10-CM | POA: Diagnosis not present

## 2014-06-09 DIAGNOSIS — L438 Other lichen planus: Secondary | ICD-10-CM | POA: Diagnosis not present

## 2014-06-09 DIAGNOSIS — L821 Other seborrheic keratosis: Secondary | ICD-10-CM | POA: Diagnosis not present

## 2014-06-09 DIAGNOSIS — D649 Anemia, unspecified: Secondary | ICD-10-CM

## 2014-06-09 DIAGNOSIS — I480 Paroxysmal atrial fibrillation: Secondary | ICD-10-CM | POA: Diagnosis not present

## 2014-06-09 DIAGNOSIS — D2261 Melanocytic nevi of right upper limb, including shoulder: Secondary | ICD-10-CM | POA: Diagnosis not present

## 2014-06-09 DIAGNOSIS — I4891 Unspecified atrial fibrillation: Secondary | ICD-10-CM

## 2014-06-09 DIAGNOSIS — D1801 Hemangioma of skin and subcutaneous tissue: Secondary | ICD-10-CM | POA: Diagnosis not present

## 2014-06-09 DIAGNOSIS — D2262 Melanocytic nevi of left upper limb, including shoulder: Secondary | ICD-10-CM | POA: Diagnosis not present

## 2014-06-09 DIAGNOSIS — Z7901 Long term (current) use of anticoagulants: Secondary | ICD-10-CM | POA: Diagnosis not present

## 2014-06-09 DIAGNOSIS — D225 Melanocytic nevi of trunk: Secondary | ICD-10-CM | POA: Diagnosis not present

## 2014-06-09 DIAGNOSIS — L570 Actinic keratosis: Secondary | ICD-10-CM | POA: Diagnosis not present

## 2014-06-09 LAB — POCT INR: INR: 2.1

## 2014-06-09 MED ORDER — ENOXAPARIN SODIUM 100 MG/ML ~~LOC~~ SOLN: 100.0000 mg | Freq: Two times a day (BID) | SUBCUTANEOUS | Status: DC

## 2014-06-09 NOTE — Patient Instructions (Addendum)
06/12/14 Take your last dose of Coumadin  06/13/14 No Coumadin or Lovenox  06/14/14 Start taking Lovenox 100mg  at 8a and 8p into the subcutaneous tissue  2 inch away from the belly Rotate the sites  06/15/14 Continue taking Lovenox 100mg  at 8a and 8p into the subcutaneous tissue 2 inch away from the belly Rotate the sites  06/16/14 Continue taking Lovenox 100mg  at 8a and 8p into the subcutaneous tissue 2 inch away from the belly Rotate the sites  06/17/14 Continue taking Lovenox 100mg  at 8a and 8p into the subcutaneous tissue 2 inch away from the belly Rotate the sites  06/18/14 Procedure date no Lovenox  Resume Lovenox and Coumadin once approved to do so by physician. You will continue the Lovenox until we see you on the next appointment.

## 2014-06-09 NOTE — Telephone Encounter (Signed)
This encounter was created in error - please disregard.

## 2014-06-09 NOTE — Telephone Encounter (Signed)
    Thompson Grayer, MD  [place], [facility]           Please assist  Thanks!       Previous Messages     ----- Message -----   From: Hulan Saas, RN   Sent: 06/04/2014  1:22 PM    To: Thompson Grayer, MD   06/04/2014    RE: Corey [name]  DOB: Jun 10, 1935  MRN: [id number]    Dear Dr. Rayann Heman,   We have scheduled the above patient for an endoscopic procedure. Our records show that he is on anticoagulation therapy.   Please advise as to whether patient may hold Coumadin five days prior to procedure on 06/18/14.   Please route your response to Leone Payor, RN   Sincerely,   Leone Payor         Pt has a history of Afib and TIA.  Per protocol, will need Lovenox bridge.  Pt is aware and will come to the office today for instruction.  Note sent to [facility] with Dr. Nichola Sizer office.

## 2014-06-09 NOTE — Progress Notes (Signed)
Denies allergies to eggs or soy products. Denies complications with sedation or anesthesia. Denies O2 use. Denies use of diet or weight loss medications.  Emmi instructions declined for endoscopy.  

## 2014-06-09 NOTE — Telephone Encounter (Signed)
-----   Message from Thompson Grayer, MD sent at 06/06/2014 10:47 PM EDT ----- Please assist Thanks!   ----- Message -----    From: Hulan Saas, RN    Sent: 06/04/2014   1:22 PM      To: Thompson Grayer, MD  06/04/2014   RE: Corey [name] DOB: 1935/06/13 MRN: [id number]   Dear Dr. Rayann Heman,  We have scheduled the above patient for an endoscopic procedure. Our records show that he is on anticoagulation therapy.   Please advise as to whether patient may hold Coumadin five days prior to procedure on 06/18/14.  Please route your response to Leone Payor, RN  Sincerely,  Leone Payor

## 2014-06-12 ENCOUNTER — Ambulatory Visit (INDEPENDENT_AMBULATORY_CARE_PROVIDER_SITE_OTHER): Payer: Medicare Other

## 2014-06-12 DIAGNOSIS — J309 Allergic rhinitis, unspecified: Secondary | ICD-10-CM | POA: Diagnosis not present

## 2014-06-13 ENCOUNTER — Ambulatory Visit (INDEPENDENT_AMBULATORY_CARE_PROVIDER_SITE_OTHER): Payer: Medicare Other

## 2014-06-13 ENCOUNTER — Telehealth: Payer: Self-pay | Admitting: Internal Medicine

## 2014-06-13 DIAGNOSIS — J309 Allergic rhinitis, unspecified: Secondary | ICD-10-CM | POA: Diagnosis not present

## 2014-06-13 NOTE — Telephone Encounter (Signed)
Date Mixed: 06/13/14 Vial: 1 Strength: 1:10 Here/Mail/Pick Up: here Mixed By: tbs

## 2014-06-18 ENCOUNTER — Telehealth: Payer: Self-pay | Admitting: *Deleted

## 2014-06-18 ENCOUNTER — Ambulatory Visit (AMBULATORY_SURGERY_CENTER): Payer: Medicare Other | Admitting: Internal Medicine

## 2014-06-18 ENCOUNTER — Other Ambulatory Visit: Payer: Self-pay | Admitting: *Deleted

## 2014-06-18 ENCOUNTER — Encounter: Payer: Self-pay | Admitting: Internal Medicine

## 2014-06-18 VITALS — BP 145/83 | HR 66 | Temp 97.3°F | Resp 17 | Ht 73.0 in | Wt 211.0 lb

## 2014-06-18 DIAGNOSIS — G4733 Obstructive sleep apnea (adult) (pediatric): Secondary | ICD-10-CM | POA: Diagnosis not present

## 2014-06-18 DIAGNOSIS — I10 Essential (primary) hypertension: Secondary | ICD-10-CM | POA: Diagnosis not present

## 2014-06-18 DIAGNOSIS — D509 Iron deficiency anemia, unspecified: Secondary | ICD-10-CM

## 2014-06-18 DIAGNOSIS — K317 Polyp of stomach and duodenum: Secondary | ICD-10-CM | POA: Diagnosis not present

## 2014-06-18 DIAGNOSIS — D649 Anemia, unspecified: Secondary | ICD-10-CM

## 2014-06-18 DIAGNOSIS — I2510 Atherosclerotic heart disease of native coronary artery without angina pectoris: Secondary | ICD-10-CM | POA: Diagnosis not present

## 2014-06-18 DIAGNOSIS — I4891 Unspecified atrial fibrillation: Secondary | ICD-10-CM | POA: Diagnosis not present

## 2014-06-18 MED ORDER — SODIUM CHLORIDE 0.9 % IV SOLN: 500.0000 mL | INTRAVENOUS | Status: DC

## 2014-06-18 MED ORDER — CYANOCOBALAMIN 1000 MCG/ML IJ SOLN: INTRAMUSCULAR | Status: DC

## 2014-06-18 NOTE — Progress Notes (Signed)
Called to room to assist during endoscopic procedure.  Patient ID and intended procedure confirmed with present staff. Received instructions for my participation in the procedure from the performing physician.  

## 2014-06-18 NOTE — Progress Notes (Signed)
To Pacu. Awake and alert, pleased with MAC. Report to RN

## 2014-06-18 NOTE — Patient Instructions (Addendum)
YOU HAD AN ENDOSCOPIC PROCEDURE TODAY AT [facility]:   Refer to the procedure report that was given to you for any specific questions about what was found during the examination.  If the procedure report does not answer your questions, please call your gastroenterologist to clarify.  If you requested that your care partner not be given the details of your procedure findings, then the procedure report has been included in a sealed envelope for you to review at your convenience later.  YOU SHOULD EXPECT: Some feelings of bloating in the abdomen. Passage of more gas than usual.  Walking can help get rid of the air that was put into your GI tract during the procedure and reduce the bloating.  Please Note:  You might notice some irritation and congestion in your nose or some drainage.  This is from the oxygen used during your procedure.  There is no need for concern and it should clear up in a day or so.  SYMPTOMS TO REPORT IMMEDIATELY:   Following upper endoscopy (EGD)  Vomiting of blood or coffee ground material  New chest pain or pain under the shoulder blades  Painful or persistently difficult swallowing  New shortness of breath  Fever of 100F or higher  Black, tarry-looking stools  For urgent or emergent issues, a gastroenterologist can be reached at any hour by calling [phone].  DIET: Your first meal following the procedure should be a small meal and then it is ok to progress to your normal diet. Heavy or fried foods are harder to digest and may make you feel nauseous or bloated.  Likewise, meals heavy in dairy and vegetables can increase bloating.  Drink plenty of fluids but you should avoid alcoholic beverages for 24 hours.  ACTIVITY:  You should plan to take it easy for the rest of today and you should NOT DRIVE or use heavy machinery until tomorrow (because of the sedation medicines used during the test).    FOLLOW UP: Our staff will call the number listed on  your records the next business day following your procedure to check on you and address any questions or concerns that you may have regarding the information given to you following your procedure. If we do not reach you, we will leave a message.  However, if you are feeling well and you are not experiencing any problems, there is no need to return our call.  We will assume that you have returned to your regular daily activities without incident.  If any biopsies were taken you will be contacted by phone or by letter within the next 1-3 weeks.  Please call us at [phone] if you have not heard about the biopsies in 3 weeks.   SIGNATURES/CONFIDENTIALITY: You and/or your care partner have signed paperwork which will be entered into your electronic medical record.  These signatures attest to the fact that that the information above on your After Visit Summary has been reviewed and is understood.  Full responsibility of the confidentiality of this discharge information lies with you and/or your care-partner.  Await pathology  Continue your Lovenox bridge for 5 days, then resume Coumadin  Please get your CBC and PT drawn at [facility]in Clinic  Please get blood drawn 2 days before your office visit with Dr. Olevia Perches  Continue your normal medications

## 2014-06-18 NOTE — Op Note (Addendum)
[place]  [address] [place], [id number]   ENDOSCOPY PROCEDURE REPORT  PATIENT: Corey [name], Corey [name]  MR#: [id number] BIRTHDATE: 1936/01/15 , 78  yrs. old GENDER: male ENDOSCOPIST: Lafayette Dragon, MD REFERRED BY:  Janith Lima, M.D. PROCEDURE DATE:  06/18/2014 PROCEDURE:  EGD w/ directed submucosal injection(s), any substance and EGD w/ snare polypectomy ASA CLASS:     Class III INDICATIONS:  Large gastric polyp removed on endoscopy several weeks ago.  We'll been has dropped.  Patient describes dark stools.  His a repeat endoscopy to assess remaining polyps area patient was on Coumadin which is currently on hold and he is bridged with Lovenox.  MEDICATIONS: Monitored anesthesia care and Propofol 300 mg IV TOPICAL ANESTHETIC: none  DESCRIPTION OF PROCEDURE: After the risks benefits and alternatives of the procedure were thoroughly explained, informed consent was obtained.  The LB [id number] endoscope was introduced through the mouth and advanced to the second portion of the duodenum , Without limitations.  The instrument was slowly withdrawn as the mucosa was fully examined.    Esophagus:[esophageal mucosa appeared normal in proximal and mid and distal esophagus   Stomach: there were 4 sessile polyps in gastric cardia located adjacent to the post-polypectomy site from prior polypectomy. These polyps did not show any stigmata of active  bleeding but  the tissue was  somewhat friable. There were injected with epinephrine 1-10,000 solution 1:, total of 4 cc's. and was snared with the hot snare.  The tissue was   recovered . Rest of the stomach appeared normal with normal gastric antrum and pyloric outlet. Retroflexion of the endoscope confirmed presence of post-polypectomy sites  Duodenum: duodenal bulb and descending duodenum was normal The scope was then withdrawn from the patient and the procedure completed.  COMPLICATIONS: There were no  immediate complications.  ENDOSCOPIC IMPRESSION:  1. Four  polyps in gastric cardia,  status post snare polypectomy and submucosal  injection of epinephrine prior to polypectomy 2. Post polypectomy site  from prior polypectomy appears to be healing and shows no stigmata of bleeding  RECOMMENDAinNS: 1.  Await pathology results 2.  Continue Lovenox bridge for 5 days and then resume Coumadin Follow prothrombin time in Coumadin clinic Office visit 6 weeks, have hemoglobin checked prior to an OV Continue PPIs  REPEAT EXAM: for EGD pending biopsy results.  eSigned:  Lafayette Dragon, MD 06/24/2014 9:13 PM Revised: 06/24/2014 9:13 PM   CC:  PATIENT NAME:  Corey [name], Corey [name] MR#: [id number]

## 2014-06-18 NOTE — Progress Notes (Signed)
Pt had to be suctioned several times t/o stay in the RR- white phlegm suctioned out.  While in the RR, he c/o chest discomfort, mid chest rating as a "2."  No pain elsewhere and no SOB.  Dr. Olevia Perches aware and states, "I'm not surprised because I injected epi into his polyps."  Pt belched quite a bit and states his chest feels better before discharge.    No complaints at DC

## 2014-06-18 NOTE — Telephone Encounter (Signed)
Called pharmacy and ordered 3 cc syringes with 25 g x 1 inch needles for patient and B 12 1000 mcg/ml as per Dr. Olevia Perches. Patient to inject 1000 mcg/ml weekly x 4 then monthly. OV scheduled on August 01, 2014 at 2:15 PM with Dr. Olevia Perches. CBC prior to OV in EPIC.

## 2014-06-19 ENCOUNTER — Telehealth: Payer: Self-pay | Admitting: *Deleted

## 2014-06-19 ENCOUNTER — Ambulatory Visit: Payer: Medicare Other | Admitting: Internal Medicine

## 2014-06-19 DIAGNOSIS — E538 Deficiency of other specified B group vitamins: Secondary | ICD-10-CM

## 2014-06-19 NOTE — Progress Notes (Signed)
Patient ID: Corey [name], male   DOB: 1935-07-22, 79 y.o.   MRN: [id number] Patient supplied the Vitamin B12 and 3cc sypringes pre-loaded with needles.  He was instructed how to clean his upper thigh muscle, how to draw up the Vitamin B12 to the 1cc line and how to inject into his leg.  He did the injection him self without hesitation.  Gives shots into his abdominal area currently so is aware of how to do injections.  He said he will alternate his legs when doing the weekly B12 injections for the first month and then will he will do them monthly until told otherwise.

## 2014-06-19 NOTE — Telephone Encounter (Signed)
  Follow up Call-  Call back number 06/18/2014 01/22/2014 02/05/2013  Post procedure Call Back phone  # [phone]  Permission to leave phone message Yes Yes Yes     Patient questions:  Do you have a fever, pain , or abdominal swelling? No. Pain Score  0 *  Have you tolerated food without any problems? Yes.    Have you been able to return to your normal activities? Yes.    Do you have any questions about your discharge instructions: Diet   No. Medications  Yes.   Follow up visit  Yes.    Do you have questions or concerns about your Care? No.  Actions: * If pain score is 4 or above: No action needed, pain <4.  Pt had several questions about follow up and his medications.  I answered them and told him and his wife to look at his AVS for the dates of his iron infusion and follow up.  I highlighted them and went over them several times with his wife yesterday in the RR.

## 2014-06-24 ENCOUNTER — Other Ambulatory Visit: Payer: Self-pay | Admitting: Neurology

## 2014-06-24 ENCOUNTER — Encounter: Payer: Self-pay | Admitting: Internal Medicine

## 2014-06-25 ENCOUNTER — Ambulatory Visit (INDEPENDENT_AMBULATORY_CARE_PROVIDER_SITE_OTHER): Payer: Medicare Other | Admitting: *Deleted

## 2014-06-25 ENCOUNTER — Telehealth: Payer: Self-pay | Admitting: Internal Medicine

## 2014-06-25 DIAGNOSIS — I4891 Unspecified atrial fibrillation: Secondary | ICD-10-CM

## 2014-06-25 DIAGNOSIS — Z7901 Long term (current) use of anticoagulants: Secondary | ICD-10-CM

## 2014-06-25 DIAGNOSIS — Z5181 Encounter for therapeutic drug level monitoring: Secondary | ICD-10-CM | POA: Diagnosis not present

## 2014-06-25 DIAGNOSIS — I480 Paroxysmal atrial fibrillation: Secondary | ICD-10-CM | POA: Diagnosis not present

## 2014-06-25 LAB — POCT INR: INR: 1.1

## 2014-06-25 NOTE — Telephone Encounter (Signed)
No answer. The patient is to take 37ml of the B12 once a week x4 injections. Then it is once monthly.

## 2014-06-26 ENCOUNTER — Ambulatory Visit (INDEPENDENT_AMBULATORY_CARE_PROVIDER_SITE_OTHER): Payer: Medicare Other

## 2014-06-26 DIAGNOSIS — J309 Allergic rhinitis, unspecified: Secondary | ICD-10-CM

## 2014-06-26 NOTE — Telephone Encounter (Signed)
Spoke with patient and clarified that he is to do 1 ml or cc weekly x 4 then monthly.

## 2014-06-30 ENCOUNTER — Ambulatory Visit (INDEPENDENT_AMBULATORY_CARE_PROVIDER_SITE_OTHER): Payer: Medicare Other | Admitting: *Deleted

## 2014-06-30 DIAGNOSIS — I480 Paroxysmal atrial fibrillation: Secondary | ICD-10-CM

## 2014-06-30 DIAGNOSIS — Z7901 Long term (current) use of anticoagulants: Secondary | ICD-10-CM

## 2014-06-30 DIAGNOSIS — Z5181 Encounter for therapeutic drug level monitoring: Secondary | ICD-10-CM

## 2014-06-30 DIAGNOSIS — I4891 Unspecified atrial fibrillation: Secondary | ICD-10-CM | POA: Diagnosis not present

## 2014-06-30 LAB — POCT INR: INR: 1.2

## 2014-06-30 MED ORDER — ENOXAPARIN SODIUM 100 MG/ML ~~LOC~~ SOLN: 100.0000 mg | Freq: Two times a day (BID) | SUBCUTANEOUS | Status: DC

## 2014-07-01 ENCOUNTER — Encounter (HOSPITAL_COMMUNITY): Payer: Self-pay

## 2014-07-01 ENCOUNTER — Encounter (HOSPITAL_COMMUNITY)
Admission: RE | Admit: 2014-07-01 | Discharge: 2014-07-01 | Disposition: A | Discharge status: Home / Self Care | Payer: Medicare Other | Source: Ambulatory Visit | Attending: Internal Medicine | Admitting: Internal Medicine

## 2014-07-01 ENCOUNTER — Other Ambulatory Visit (HOSPITAL_COMMUNITY): Payer: Self-pay | Admitting: Internal Medicine

## 2014-07-01 VITALS — BP 132/69 | HR 69 | Temp 97.8°F | Resp 18

## 2014-07-01 DIAGNOSIS — D509 Iron deficiency anemia, unspecified: Secondary | ICD-10-CM | POA: Insufficient documentation

## 2014-07-01 MED ORDER — SODIUM CHLORIDE 0.9 % IV SOLN
Freq: Once | INTRAVENOUS | Status: AC
  Administered 2014-07-01: 14:00:00 via INTRAVENOUS

## 2014-07-01 MED ORDER — SODIUM CHLORIDE 0.9 % IV SOLN
510.0000 mg | INTRAVENOUS | Status: DC
  Administered 2014-07-01: 510 mg via INTRAVENOUS
  Filled 2014-07-01: qty 17

## 2014-07-01 NOTE — Discharge Instructions (Signed)

## 2014-07-01 NOTE — Progress Notes (Signed)
Pt tolerated first dose of Feraheme without incident.  Pt due for 2nd dose on June 14.  Pt is aware of this next appointment.  Pt was d/c ambulatory to home.

## 2014-07-02 ENCOUNTER — Encounter: Payer: Medicare Other | Admitting: Internal Medicine

## 2014-07-02 DIAGNOSIS — L298 Other pruritus: Secondary | ICD-10-CM | POA: Diagnosis not present

## 2014-07-02 DIAGNOSIS — Z85828 Personal history of other malignant neoplasm of skin: Secondary | ICD-10-CM | POA: Diagnosis not present

## 2014-07-03 ENCOUNTER — Ambulatory Visit (INDEPENDENT_AMBULATORY_CARE_PROVIDER_SITE_OTHER): Payer: Medicare Other

## 2014-07-03 ENCOUNTER — Ambulatory Visit (INDEPENDENT_AMBULATORY_CARE_PROVIDER_SITE_OTHER): Payer: Medicare Other | Admitting: *Deleted

## 2014-07-03 ENCOUNTER — Other Ambulatory Visit (INDEPENDENT_AMBULATORY_CARE_PROVIDER_SITE_OTHER): Payer: Medicare Other

## 2014-07-03 ENCOUNTER — Encounter: Payer: Self-pay | Admitting: *Deleted

## 2014-07-03 DIAGNOSIS — J309 Allergic rhinitis, unspecified: Secondary | ICD-10-CM

## 2014-07-03 DIAGNOSIS — I5022 Chronic systolic (congestive) heart failure: Secondary | ICD-10-CM | POA: Diagnosis not present

## 2014-07-03 DIAGNOSIS — Z95810 Presence of automatic (implantable) cardiac defibrillator: Secondary | ICD-10-CM

## 2014-07-03 DIAGNOSIS — Z5181 Encounter for therapeutic drug level monitoring: Secondary | ICD-10-CM | POA: Diagnosis not present

## 2014-07-03 DIAGNOSIS — I480 Paroxysmal atrial fibrillation: Secondary | ICD-10-CM

## 2014-07-03 DIAGNOSIS — I509 Heart failure, unspecified: Secondary | ICD-10-CM | POA: Diagnosis not present

## 2014-07-03 DIAGNOSIS — Z7901 Long term (current) use of anticoagulants: Secondary | ICD-10-CM

## 2014-07-03 DIAGNOSIS — D509 Iron deficiency anemia, unspecified: Secondary | ICD-10-CM

## 2014-07-03 DIAGNOSIS — I4891 Unspecified atrial fibrillation: Secondary | ICD-10-CM | POA: Diagnosis not present

## 2014-07-03 LAB — CBC WITH DIFFERENTIAL/PLATELET
Basophils Absolute: 0.1 10*3/uL (ref 0.0–0.1)
Basophils Relative: 1.5 % (ref 0.0–3.0)
Eosinophils Absolute: 0.1 10*3/uL (ref 0.0–0.7)
Eosinophils Relative: 3.2 % (ref 0.0–5.0)
HCT: 34.9 % — ABNORMAL LOW (ref 39.0–52.0)
Hemoglobin: 11.5 g/dL — ABNORMAL LOW (ref 13.0–17.0)
Lymphocytes Relative: 35 % (ref 12.0–46.0)
Lymphs Abs: 1.3 10*3/uL (ref 0.7–4.0)
MCHC: 32.8 g/dL (ref 30.0–36.0)
MCV: 81.1 fl (ref 78.0–100.0)
MONO ABS: 0.4 10*3/uL (ref 0.1–1.0)
MONOS PCT: 9.7 % (ref 3.0–12.0)
Neutro Abs: 1.9 10*3/uL (ref 1.4–7.7)
Neutrophils Relative %: 50.6 % (ref 43.0–77.0)
PLATELETS: 242 10*3/uL (ref 150.0–400.0)
RBC: 4.31 Mil/uL (ref 4.22–5.81)
RDW: 15.9 % — AB (ref 11.5–15.5)
WBC: 3.7 10*3/uL — ABNORMAL LOW (ref 4.0–10.5)

## 2014-07-03 LAB — BASIC METABOLIC PANEL
BUN: 13 mg/dL (ref 6–23)
CALCIUM: 9.1 mg/dL (ref 8.4–10.5)
CO2: 27 mEq/L (ref 19–32)
Chloride: 104 mEq/L (ref 96–112)
Creatinine, Ser: 1.26 mg/dL (ref 0.40–1.50)
GFR: 59.05 mL/min — ABNORMAL LOW (ref >60.00)
Glucose, Bld: 89 mg/dL (ref 70–99)
Potassium: 4.3 mEq/L (ref 3.5–5.1)
SODIUM: 137 meq/L (ref 135–145)

## 2014-07-03 LAB — PROTIME-INR
INR: 1.7 ratio — AB (ref 0.8–1.0)
PROTHROMBIN TIME: 19.1 s — AB (ref 9.6–13.1)

## 2014-07-03 LAB — POCT INR: INR: 1.7

## 2014-07-03 NOTE — Progress Notes (Signed)
EPIC Encounter for ICM Monitoring  Patient Name: Corey [name] is a 79 y.o. male Date: 07/03/2014 Primary Care Physican: Scarlette Calico, MD Primary Cardiologist: Allred Electrophysiologist: Allred Dry Weight: 202 lbs  Bi- V pacing: >99%       In the past month, have you:  1. Gained more than 2 pounds in a day or more than 5 pounds in a week? no  2. Had changes in your medications (with verification of current medications)? He did start having iron infusions recently.  3. Had more shortness of breath than is usual for you? no  4. Limited your activity because of shortness of breath? no  5. Not been able to sleep because of shortness of breath? no  6. Had increased swelling in your feet or ankles? no  7. Had symptoms of dehydration (dizziness, dry mouth, increased thirst, decreased urine output) no  8. Had changes in sodium restriction? no  9. Been compliant with medication? Yes   ICM trend:   Follow-up plan: ICM clinic phone appointment: 08/04/14. No changes made today.  Copy of note sent to patient's primary care physician, primary cardiologist, and device following physician.  Alvis Lemmings, RN, BSN 07/03/2014 4:32 PM

## 2014-07-04 ENCOUNTER — Encounter: Payer: Self-pay | Admitting: Internal Medicine

## 2014-07-04 ENCOUNTER — Other Ambulatory Visit (INDEPENDENT_AMBULATORY_CARE_PROVIDER_SITE_OTHER): Payer: Medicare Other

## 2014-07-04 ENCOUNTER — Ambulatory Visit (INDEPENDENT_AMBULATORY_CARE_PROVIDER_SITE_OTHER): Payer: Medicare Other | Admitting: Internal Medicine

## 2014-07-04 VITALS — BP 118/62 | HR 64 | Ht 73.0 in | Wt 210.4 lb

## 2014-07-04 DIAGNOSIS — D62 Acute posthemorrhagic anemia: Secondary | ICD-10-CM | POA: Diagnosis not present

## 2014-07-04 DIAGNOSIS — Z79899 Other long term (current) drug therapy: Secondary | ICD-10-CM | POA: Diagnosis not present

## 2014-07-04 DIAGNOSIS — I255 Ischemic cardiomyopathy: Secondary | ICD-10-CM | POA: Diagnosis not present

## 2014-07-04 DIAGNOSIS — K317 Polyp of stomach and duodenum: Secondary | ICD-10-CM | POA: Diagnosis not present

## 2014-07-04 DIAGNOSIS — K51411 Inflammatory polyps of colon with rectal bleeding: Secondary | ICD-10-CM

## 2014-07-04 LAB — FERRITIN: Ferritin: 216.5 ng/mL (ref 22.0–322.0)

## 2014-07-04 LAB — IBC PANEL
Iron: 591 ug/dL — ABNORMAL HIGH (ref 42–165)
Saturation Ratios: 138.4 % — ABNORMAL HIGH (ref 20.0–50.0)
Transferrin: 305 mg/dL (ref 212.0–360.0)

## 2014-07-04 LAB — PROTIME-INR
INR: 2 ratio — ABNORMAL HIGH (ref 0.8–1.0)
PROTHROMBIN TIME: 21.7 s — AB (ref 9.6–13.1)

## 2014-07-04 LAB — CBC WITH DIFFERENTIAL/PLATELET
Basophils Absolute: 0 10*3/uL (ref 0.0–0.1)
Basophils Relative: 1 % (ref 0.0–3.0)
EOS PCT: 3.2 % (ref 0.0–5.0)
Eosinophils Absolute: 0.1 10*3/uL (ref 0.0–0.7)
HCT: 34.2 % — ABNORMAL LOW (ref 39.0–52.0)
Hemoglobin: 11.3 g/dL — ABNORMAL LOW (ref 13.0–17.0)
LYMPHS PCT: 35.8 % (ref 12.0–46.0)
Lymphs Abs: 1.7 10*3/uL (ref 0.7–4.0)
MCHC: 32.9 g/dL (ref 30.0–36.0)
MCV: 81.8 fl (ref 78.0–100.0)
Monocytes Absolute: 0.5 10*3/uL (ref 0.1–1.0)
Monocytes Relative: 11.4 % (ref 3.0–12.0)
NEUTROS ABS: 2.3 10*3/uL (ref 1.4–7.7)
NEUTROS PCT: 48.6 % (ref 43.0–77.0)
Platelets: 244 10*3/uL (ref 150.0–400.0)
RBC: 4.18 Mil/uL — ABNORMAL LOW (ref 4.22–5.81)
RDW: 15.6 % — ABNORMAL HIGH (ref 11.5–15.5)
WBC: 4.7 10*3/uL (ref 4.0–10.5)

## 2014-07-04 LAB — FOLATE: Folate: 23.5 ng/mL (ref >5.9)

## 2014-07-04 LAB — VITAMIN B12: VITAMIN B 12: 855 pg/mL (ref 211–911)

## 2014-07-04 NOTE — Patient Instructions (Addendum)
Please have labs today and then every 2 weeks and have those faxed to our office at [phone]  Dr Rayann Heman,  Dr Scarlette Calico

## 2014-07-04 NOTE — Progress Notes (Signed)
Corey [name] 07-30-1935 [id number]  Note: This dictation was prepared with Dragon digital system. Any transcriptional errors that result from this procedure are unintentional.   History of Present Illness: This is a 79 year old white male post UGIB,  upper endoscopy and gastric polypectomy 3 weeks ago. 4 hyperplastic polyps were removed from proximal stomach. Epinephrine was used for hemostasis. Initial polypectomy of a alrge polyp  was carried out in December 2015. At that time he had a rather large hyperplastic polyp at the gastric cardia. Patient has been on long-term Coumadin which was on hold while he was undergoing endoscopic procedures.. He has been back on Coumadin and his last INR was 1.8. He is followed at Coumadin clinic. Patient received 1 iron infusion of Ferraheme last week and is scheduled to have second iron infusion next week. He is also on B12 injections, initial B12 level  215 g. He continues  weekly B12 shots for 4 weeks then monthly. He is doing well. His level of energy has improved    Past Medical History  Diagnosis Date  . Alcohol abuse, episodic 05/19/2008  . ALLERGIC RHINITIS 04/15/2009  . COLONIC POLYPS, ADENOMATOUS, HX OF 02/14/2008  . DIVERTICULOSIS, COLON 02/14/2008  . ESOPHAGEAL STRICTURE 01/28/2008  . GERD 02/14/2008  . HYPERGLYCEMIA 11/16/2006  . HYPERLIPIDEMIA 02/14/2008  . INSOMNIA-SLEEP DISORDER-UNSPEC 02/11/2009  . 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 PROSTATE CANCER, HX OF 02/25/2000  . TRANSIENT ISCHEMIC ATTACKS, HX OF 11/16/2006  . Arthritis   . Atrial fibrillation 11/16/2006    remote CHF related to atrial fib with rapid ventricular response over 25 yrs per office note,  . ASTHMA 11/16/2006    sinusitis-    hx ?yeast patch/white patch on vocal cord as per [facility] 02/25/11-   . OBSTRUCTIVE SLEEP APNEA 11/10/2008  . SLEEP APNEA 10/03/2009    LOV Dr [name] 12/12 in EP[facility]    Moderate per patient- settings ?6/last sleep study years ago  . HYPERTENSION  11/16/2006  . Symptomatic bradycardia, secondary to sinus node dysfunction 07/08/2011    s/p [facility] Scientific PPM by Dr Sallyanne Kuster, upgraded to [employer] ICD 12/13 by Dr Rayann Heman (SJM)  . Myocardial infarction 1993  . Pacemaker     St. Jude  . ICD (implantable cardiac defibrillator) in place   . Anxiety   . Depression   . Pneumonia     hx of  . Headache(784.0)   . Cancer 2002    prostate cancer  . CORONARY ARTERY DISEASE 11/16/2006    LHC (07/2011): LAD with luminal irregularities, proximal circumflex 50%, EF 25%  . Chronic systolic CHF (congestive heart failure)     echo (10/02/12): EF 38-18%, grade 1 diastolic dysfunction, trivial AI, mild MR, mild RVE  . NICM (nonischemic cardiomyopathy)   . Stroke     Tia  . Lesion of vocal cord     bx begine  . Allergy     YEAR ROUND,TAKE SHOTS  . Sleep apnea   . Alzheimer's dementia     beginning with sx    Past Surgical History  Procedure Laterality Date  . Tonsilectomy, adenoidectomy, bilateral myringotomy and tubes    . Knee arthroscopy      2 surgeries right and 1 left  . Prostate surgery  2/02    prostate cancer  . Uvulopalatopharyngoplasty  2013  . Esophagogastroduodenoscopy      with dilitation  . Lumbar laminectomy/decompression microdiscectomy  03/02/2011    Procedure: LUMBAR LAMINECTOMY/DECOMPRESSION MICRODISCECTOMY;  Surgeon: [name], MD;  Location: [facility];  Service: Orthopedics;  Laterality: N/A;  Decompression Lumbar 4 - Lumbar(X-Ray)  . Pacemaker insertion  06/2011    [employer] PPM implanted by Dr [employer]  . Cardiac defibrillator placement  01/16/12    Upgrade to a biventricular SJM ICD by DR Allred  . Tonsillectomy  as child  . Cardiac catheterization      several  . Back surgery    . Insert / replace / remove pacemaker    . Total knee arthroplasty Left 06/04/2012    Procedure: TOTAL KNEE ARTHROPLASTY;  Surgeon: [name], MD;  Location: [facility];  Service: Orthopedics;  Laterality: Left;  . Colonoscopy     . Vocal cord lesion bx    . Permanent pacemaker insertion Left 07/08/2011    Procedure: PERMANENT PACEMAKER INSERTION;  Surgeon: [name], MD;  Location: [facility];  Service: Cardiovascular;  Laterality: Left;  . Left and right heart catheterization with coronary angiogram N/A 08/18/2011    Procedure: LEFT AND RIGHT HEART CATHETERIZATION WITH CORONARY ANGIOGRAM;  Surgeon: [name], MD;  Location: [facility];  Service: Cardiovascular;  Laterality: N/A;  . Bi-ventricular implantable cardioverter defibrillator upgrade N/A 01/16/2012    Procedure: BI-VENTRICULAR IMPLANTABLE CARDIOVERTER DEFIBRILLATOR UPGRADE;  Surgeon: Thompson Grayer, MD;  Location: [facility];  Service: Cardiovascular;  Laterality: N/A;    Allergies  Allergen Reactions  . Hydrocodone Other (See Comments)    Severe panic attacks  . Oxycodone Other (See Comments)    Severe panic attacks    Family history and social history have been reviewed.  Review of Systems: Denies abdominal pain rectal bleeding  The remainder of the 10 point ROS is negative except as outlined in the H&P  Physical Exam: General Appearance Well developed, in no distress Eyes  Non icteric  HEENT  Non traumatic, normocephalic  Mouth No lesion, tongue papillated, no cheilosis Neck Supple without adenopathy, thyroid not enlarged, no carotid bruits, no JVD Lungs Clear to auscultation bilaterally COR Normal S1, normal S2, regular rhythm, no murmur, quiet precordium Abdomen mildly protuberant. Soft with normoactive bowel sounds. Nontender Rectal soft Hemoccult-negative stool Extremities  No pedal edema Skin No lesions Neurological Alert and oriented x 3 Psychological Normal mood and affect  Assessment and Plan:   79 year old white male on chronic Coumadin therapy for AF, post upper GI bleed from benign gastric polyps. He is currently Hemoccult-negative. Last hemoglobin was 11.0. He will complete the second iron infusions next week and  will continue B12 injections weekly for 4 weeks and then on monthly basis. He will be going out of town for 7 weeks to [place] and we will arrange for him to have his blood count and prothrombin time checked every 2 weeks there. Continue all medications including pantoprazole 40 mg daily    Delfin Edis 07/04/2014

## 2014-07-07 ENCOUNTER — Other Ambulatory Visit: Payer: Self-pay | Admitting: Internal Medicine

## 2014-07-07 ENCOUNTER — Ambulatory Visit (INDEPENDENT_AMBULATORY_CARE_PROVIDER_SITE_OTHER): Payer: Medicare Other | Admitting: *Deleted

## 2014-07-07 DIAGNOSIS — Z7901 Long term (current) use of anticoagulants: Secondary | ICD-10-CM | POA: Diagnosis not present

## 2014-07-07 DIAGNOSIS — I480 Paroxysmal atrial fibrillation: Secondary | ICD-10-CM | POA: Diagnosis not present

## 2014-07-07 DIAGNOSIS — I4891 Unspecified atrial fibrillation: Secondary | ICD-10-CM | POA: Diagnosis not present

## 2014-07-07 DIAGNOSIS — Z5181 Encounter for therapeutic drug level monitoring: Secondary | ICD-10-CM

## 2014-07-07 LAB — POCT INR: INR: 2.3

## 2014-07-08 ENCOUNTER — Encounter (HOSPITAL_COMMUNITY)
Admission: RE | Admit: 2014-07-08 | Discharge: 2014-07-08 | Disposition: A | Discharge status: Home / Self Care | Payer: Medicare Other | Source: Ambulatory Visit | Attending: Internal Medicine | Admitting: Internal Medicine

## 2014-07-08 ENCOUNTER — Encounter (HOSPITAL_COMMUNITY): Payer: Self-pay

## 2014-07-08 VITALS — BP 118/63 | HR 65 | Temp 98.4°F | Resp 18

## 2014-07-08 DIAGNOSIS — D509 Iron deficiency anemia, unspecified: Secondary | ICD-10-CM | POA: Diagnosis not present

## 2014-07-08 MED ORDER — SODIUM CHLORIDE 0.9 % IV SOLN
510.0000 mg | INTRAVENOUS | Status: AC
  Administered 2014-07-08: 510 mg via INTRAVENOUS
  Filled 2014-07-08: qty 17

## 2014-07-08 MED ORDER — SODIUM CHLORIDE 0.9 % IV SOLN
Freq: Once | INTRAVENOUS | Status: AC
  Administered 2014-07-08: 13:00:00 via INTRAVENOUS

## 2014-07-08 NOTE — Progress Notes (Signed)
Uneventful infusion of #2/2 Feraheme  And post infusion 30 minute observation. Pt discharged ambulatory to main lobby

## 2014-07-15 ENCOUNTER — Telehealth: Payer: Self-pay | Admitting: Internal Medicine

## 2014-07-15 NOTE — Telephone Encounter (Signed)
New Message       Pt calling wanting to let Nira Conn know that he got her letter and won't be able to transmit on 08/04/14 because he will still be out of town. Pt states he won't be back in town until after the 2nd week in August.

## 2014-07-15 NOTE — Telephone Encounter (Signed)
Will route to Alvis Lemmings, RN

## 2014-07-16 ENCOUNTER — Encounter: Payer: Self-pay | Admitting: *Deleted

## 2014-07-16 NOTE — Telephone Encounter (Signed)
Rescheduled to 09/11/14. Letter mailed to patient as a reminder.

## 2014-07-21 ENCOUNTER — Ambulatory Visit (INDEPENDENT_AMBULATORY_CARE_PROVIDER_SITE_OTHER): Payer: Medicare Other | Admitting: Pharmacist

## 2014-07-21 DIAGNOSIS — Z5181 Encounter for therapeutic drug level monitoring: Secondary | ICD-10-CM | POA: Diagnosis not present

## 2014-07-21 DIAGNOSIS — I4891 Unspecified atrial fibrillation: Secondary | ICD-10-CM | POA: Diagnosis not present

## 2014-07-21 DIAGNOSIS — Z7901 Long term (current) use of anticoagulants: Secondary | ICD-10-CM | POA: Diagnosis not present

## 2014-07-21 DIAGNOSIS — I480 Paroxysmal atrial fibrillation: Secondary | ICD-10-CM

## 2014-07-21 LAB — PROTIME-INR: INR: 2.5 — AB (ref 0.9–1.1)

## 2014-07-22 ENCOUNTER — Telehealth: Payer: Self-pay | Admitting: Internal Medicine

## 2014-07-22 NOTE — Telephone Encounter (Signed)
Date Mixed: 06/13/14 Vial: 1 Strength: 1:10 Here/Mail/Pick Up: Here Mixed By: tbs

## 2014-08-01 ENCOUNTER — Ambulatory Visit: Payer: Medicare Other | Admitting: Internal Medicine

## 2014-08-16 DIAGNOSIS — G309 Alzheimer's disease, unspecified: Secondary | ICD-10-CM | POA: Diagnosis not present

## 2014-08-16 DIAGNOSIS — R091 Pleurisy: Secondary | ICD-10-CM | POA: Diagnosis not present

## 2014-08-16 DIAGNOSIS — I482 Chronic atrial fibrillation: Secondary | ICD-10-CM | POA: Diagnosis not present

## 2014-08-16 DIAGNOSIS — J209 Acute bronchitis, unspecified: Secondary | ICD-10-CM | POA: Diagnosis not present

## 2014-08-18 ENCOUNTER — Telehealth: Payer: Self-pay | Admitting: *Deleted

## 2014-08-18 NOTE — Telephone Encounter (Signed)
Called patient to advise that he is overdue for an INR and he states he would have it done on 08/20/14 at the [place] [facility] the [facility]. Advised that we would call him once we get the results and give dosing instructions.

## 2014-08-20 DIAGNOSIS — Z5181 Encounter for therapeutic drug level monitoring: Secondary | ICD-10-CM | POA: Diagnosis not present

## 2014-08-20 DIAGNOSIS — Z7901 Long term (current) use of anticoagulants: Secondary | ICD-10-CM | POA: Diagnosis not present

## 2014-08-20 DIAGNOSIS — I4891 Unspecified atrial fibrillation: Secondary | ICD-10-CM | POA: Diagnosis not present

## 2014-09-08 ENCOUNTER — Ambulatory Visit (INDEPENDENT_AMBULATORY_CARE_PROVIDER_SITE_OTHER): Payer: Medicare Other

## 2014-09-08 DIAGNOSIS — Z7901 Long term (current) use of anticoagulants: Secondary | ICD-10-CM

## 2014-09-08 DIAGNOSIS — I4891 Unspecified atrial fibrillation: Secondary | ICD-10-CM

## 2014-09-08 DIAGNOSIS — I480 Paroxysmal atrial fibrillation: Secondary | ICD-10-CM

## 2014-09-08 DIAGNOSIS — Z5181 Encounter for therapeutic drug level monitoring: Secondary | ICD-10-CM

## 2014-09-08 LAB — POCT INR: INR: 3.7

## 2014-09-11 ENCOUNTER — Telehealth: Payer: Self-pay | Admitting: *Deleted

## 2014-09-11 ENCOUNTER — Ambulatory Visit (INDEPENDENT_AMBULATORY_CARE_PROVIDER_SITE_OTHER): Payer: Medicare Other

## 2014-09-11 ENCOUNTER — Encounter: Payer: Self-pay | Admitting: Cardiovascular Disease

## 2014-09-11 ENCOUNTER — Ambulatory Visit (INDEPENDENT_AMBULATORY_CARE_PROVIDER_SITE_OTHER): Payer: Medicare Other | Admitting: *Deleted

## 2014-09-11 DIAGNOSIS — I255 Ischemic cardiomyopathy: Secondary | ICD-10-CM | POA: Diagnosis not present

## 2014-09-11 DIAGNOSIS — J309 Allergic rhinitis, unspecified: Secondary | ICD-10-CM | POA: Diagnosis not present

## 2014-09-11 DIAGNOSIS — I495 Sick sinus syndrome: Secondary | ICD-10-CM

## 2014-09-11 NOTE — Telephone Encounter (Signed)
ICM transmission received. I left a message for the patient to call on his cell #.

## 2014-09-11 NOTE — Progress Notes (Signed)
Remote ICD transmission.   

## 2014-09-16 ENCOUNTER — Encounter: Payer: Self-pay | Admitting: Internal Medicine

## 2014-09-19 ENCOUNTER — Telehealth: Payer: Self-pay | Admitting: Internal Medicine

## 2014-09-19 LAB — CUP PACEART REMOTE DEVICE CHECK
Brady Statistic AP VP Percent: 99 %
Brady Statistic AS VP Percent: 1 %
Brady Statistic AS VS Percent: 1 %
Date Time Interrogation Session: 20160818060830
HIGH POWER IMPEDANCE MEASURED VALUE: 74 Ohm
HighPow Impedance: 74 Ohm
Lead Channel Pacing Threshold Amplitude: 1 V
Lead Channel Pacing Threshold Pulse Width: 0.5 ms
Lead Channel Pacing Threshold Pulse Width: 0.5 ms
Lead Channel Sensing Intrinsic Amplitude: 2.6 mV
Lead Channel Setting Pacing Amplitude: 2.5 V
Lead Channel Setting Pacing Pulse Width: 0.5 ms
MDC IDC MSMT BATTERY REMAINING LONGEVITY: 52 mo
MDC IDC MSMT BATTERY REMAINING PERCENTAGE: 63 %
MDC IDC MSMT BATTERY VOLTAGE: 2.95 V
MDC IDC MSMT LEADCHNL LV IMPEDANCE VALUE: 1100 Ohm
MDC IDC MSMT LEADCHNL LV PACING THRESHOLD AMPLITUDE: 1 V
MDC IDC MSMT LEADCHNL RA IMPEDANCE VALUE: 530 Ohm
MDC IDC MSMT LEADCHNL RA PACING THRESHOLD PULSEWIDTH: 0.5 ms
MDC IDC MSMT LEADCHNL RV IMPEDANCE VALUE: 460 Ohm
MDC IDC MSMT LEADCHNL RV PACING THRESHOLD AMPLITUDE: 1.25 V
MDC IDC MSMT LEADCHNL RV SENSING INTR AMPL: 11.3 mV
MDC IDC PG MODEL: 3265
MDC IDC PG SERIAL: 7057550
MDC IDC SET LEADCHNL LV PACING AMPLITUDE: 2 V
MDC IDC SET LEADCHNL LV PACING PULSEWIDTH: 0.5 ms
MDC IDC SET LEADCHNL RA PACING AMPLITUDE: 2 V
MDC IDC SET LEADCHNL RV SENSING SENSITIVITY: 0.5 mV
MDC IDC SET ZONE DETECTION INTERVAL: 340 ms
MDC IDC STAT BRADY AP VS PERCENT: 1 %
MDC IDC STAT BRADY RA PERCENT PACED: 99 %
Zone Setting Detection Interval: 270 ms

## 2014-09-19 NOTE — Telephone Encounter (Signed)
Spoke with pt. He reports medicines he forgot to take today were once a day medications--amiodarone, ramipril, aldactone and folic acid.  I told him he could take these now.Tomorrow he should take around noon and the next day he could go back to regular AM time.

## 2014-09-19 NOTE — Telephone Encounter (Signed)
New Message        Pt calling stating that he was in a rush this morning and didn't take his morning medications and wants to know if he needs to take the ones he missed this morning with his evening medications or just not worry about it and only take his evening medications. Pt states that this is an urgent matter and needs to be notified of what to do before we close today. Please call back and advise.

## 2014-09-23 ENCOUNTER — Ambulatory Visit (INDEPENDENT_AMBULATORY_CARE_PROVIDER_SITE_OTHER): Payer: Medicare Other | Admitting: Pharmacist

## 2014-09-23 ENCOUNTER — Other Ambulatory Visit: Payer: Self-pay | Admitting: Neurology

## 2014-09-23 DIAGNOSIS — I480 Paroxysmal atrial fibrillation: Secondary | ICD-10-CM

## 2014-09-23 DIAGNOSIS — Z5181 Encounter for therapeutic drug level monitoring: Secondary | ICD-10-CM

## 2014-09-23 DIAGNOSIS — Z7901 Long term (current) use of anticoagulants: Secondary | ICD-10-CM

## 2014-09-23 DIAGNOSIS — I4891 Unspecified atrial fibrillation: Secondary | ICD-10-CM | POA: Diagnosis not present

## 2014-09-23 LAB — POCT INR: INR: 3.1

## 2014-09-25 ENCOUNTER — Ambulatory Visit: Payer: Medicare Other

## 2014-09-25 DIAGNOSIS — M545 Low back pain: Secondary | ICD-10-CM | POA: Diagnosis not present

## 2014-10-02 ENCOUNTER — Ambulatory Visit (INDEPENDENT_AMBULATORY_CARE_PROVIDER_SITE_OTHER): Payer: Medicare Other

## 2014-10-02 DIAGNOSIS — J309 Allergic rhinitis, unspecified: Secondary | ICD-10-CM

## 2014-10-05 ENCOUNTER — Encounter (HOSPITAL_COMMUNITY): Payer: Self-pay

## 2014-10-05 ENCOUNTER — Emergency Department (HOSPITAL_COMMUNITY): Payer: Medicare Other

## 2014-10-05 ENCOUNTER — Emergency Department (HOSPITAL_COMMUNITY)
Admission: EM | Admit: 2014-10-05 | Discharge: 2014-10-05 | Disposition: A | Discharge status: Home / Self Care | Payer: Medicare Other | Attending: Emergency Medicine | Admitting: Emergency Medicine

## 2014-10-05 DIAGNOSIS — Z79899 Other long term (current) drug therapy: Secondary | ICD-10-CM | POA: Insufficient documentation

## 2014-10-05 DIAGNOSIS — Z95810 Presence of automatic (implantable) cardiac defibrillator: Secondary | ICD-10-CM | POA: Insufficient documentation

## 2014-10-05 DIAGNOSIS — K219 Gastro-esophageal reflux disease without esophagitis: Secondary | ICD-10-CM | POA: Diagnosis not present

## 2014-10-05 DIAGNOSIS — J45909 Unspecified asthma, uncomplicated: Secondary | ICD-10-CM | POA: Insufficient documentation

## 2014-10-05 DIAGNOSIS — I252 Old myocardial infarction: Secondary | ICD-10-CM | POA: Insufficient documentation

## 2014-10-05 DIAGNOSIS — M1990 Unspecified osteoarthritis, unspecified site: Secondary | ICD-10-CM | POA: Insufficient documentation

## 2014-10-05 DIAGNOSIS — F329 Major depressive disorder, single episode, unspecified: Secondary | ICD-10-CM | POA: Diagnosis not present

## 2014-10-05 DIAGNOSIS — F419 Anxiety disorder, unspecified: Secondary | ICD-10-CM | POA: Diagnosis not present

## 2014-10-05 DIAGNOSIS — W108XXA Fall (on) (from) other stairs and steps, initial encounter: Secondary | ICD-10-CM | POA: Insufficient documentation

## 2014-10-05 DIAGNOSIS — F0280 Dementia in other diseases classified elsewhere without behavioral disturbance: Secondary | ICD-10-CM | POA: Insufficient documentation

## 2014-10-05 DIAGNOSIS — Y9289 Other specified places as the place of occurrence of the external cause: Secondary | ICD-10-CM | POA: Insufficient documentation

## 2014-10-05 DIAGNOSIS — Z8546 Personal history of malignant neoplasm of prostate: Secondary | ICD-10-CM | POA: Diagnosis not present

## 2014-10-05 DIAGNOSIS — I2510 Atherosclerotic heart disease of native coronary artery without angina pectoris: Secondary | ICD-10-CM | POA: Insufficient documentation

## 2014-10-05 DIAGNOSIS — S60511A Abrasion of right hand, initial encounter: Secondary | ICD-10-CM

## 2014-10-05 DIAGNOSIS — I10 Essential (primary) hypertension: Secondary | ICD-10-CM | POA: Diagnosis not present

## 2014-10-05 DIAGNOSIS — Z7901 Long term (current) use of anticoagulants: Secondary | ICD-10-CM | POA: Diagnosis not present

## 2014-10-05 DIAGNOSIS — Z87891 Personal history of nicotine dependence: Secondary | ICD-10-CM | POA: Diagnosis not present

## 2014-10-05 DIAGNOSIS — S299XXA Unspecified injury of thorax, initial encounter: Secondary | ICD-10-CM | POA: Diagnosis not present

## 2014-10-05 DIAGNOSIS — Z8673 Personal history of transient ischemic attack (TIA), and cerebral infarction without residual deficits: Secondary | ICD-10-CM | POA: Insufficient documentation

## 2014-10-05 DIAGNOSIS — Z9889 Other specified postprocedural states: Secondary | ICD-10-CM | POA: Diagnosis not present

## 2014-10-05 DIAGNOSIS — S0990XA Unspecified injury of head, initial encounter: Secondary | ICD-10-CM | POA: Diagnosis not present

## 2014-10-05 DIAGNOSIS — S8992XA Unspecified injury of left lower leg, initial encounter: Secondary | ICD-10-CM | POA: Insufficient documentation

## 2014-10-05 DIAGNOSIS — M542 Cervicalgia: Secondary | ICD-10-CM | POA: Diagnosis not present

## 2014-10-05 DIAGNOSIS — S199XXA Unspecified injury of neck, initial encounter: Secondary | ICD-10-CM | POA: Insufficient documentation

## 2014-10-05 DIAGNOSIS — Z86010 Personal history of colonic polyps: Secondary | ICD-10-CM | POA: Diagnosis not present

## 2014-10-05 DIAGNOSIS — I4891 Unspecified atrial fibrillation: Secondary | ICD-10-CM | POA: Diagnosis not present

## 2014-10-05 DIAGNOSIS — G309 Alzheimer's disease, unspecified: Secondary | ICD-10-CM | POA: Insufficient documentation

## 2014-10-05 DIAGNOSIS — S0083XA Contusion of other part of head, initial encounter: Secondary | ICD-10-CM | POA: Diagnosis not present

## 2014-10-05 DIAGNOSIS — Y998 Other external cause status: Secondary | ICD-10-CM | POA: Diagnosis not present

## 2014-10-05 DIAGNOSIS — S3993XA Unspecified injury of pelvis, initial encounter: Secondary | ICD-10-CM | POA: Diagnosis not present

## 2014-10-05 DIAGNOSIS — M25562 Pain in left knee: Secondary | ICD-10-CM | POA: Diagnosis not present

## 2014-10-05 DIAGNOSIS — Y9389 Activity, other specified: Secondary | ICD-10-CM | POA: Diagnosis not present

## 2014-10-05 DIAGNOSIS — I5022 Chronic systolic (congestive) heart failure: Secondary | ICD-10-CM | POA: Insufficient documentation

## 2014-10-05 DIAGNOSIS — S6991XA Unspecified injury of right wrist, hand and finger(s), initial encounter: Secondary | ICD-10-CM | POA: Diagnosis not present

## 2014-10-05 DIAGNOSIS — C61 Malignant neoplasm of prostate: Secondary | ICD-10-CM | POA: Diagnosis not present

## 2014-10-05 LAB — I-STAT CHEM 8, ED
BUN: 27 mg/dL — ABNORMAL HIGH (ref 6–20)
Calcium, Ion: 1.19 mmol/L (ref 1.13–1.30)
Chloride: 106 mmol/L (ref 101–111)
Creatinine, Ser: 1.3 mg/dL — ABNORMAL HIGH (ref 0.61–1.24)
GLUCOSE: 86 mg/dL (ref 65–99)
HCT: 36 % — ABNORMAL LOW (ref 39.0–52.0)
HEMOGLOBIN: 12.2 g/dL — AB (ref 13.0–17.0)
POTASSIUM: 3.8 mmol/L (ref 3.5–5.1)
Sodium: 143 mmol/L (ref 135–145)
TCO2: 25 mmol/L (ref 0–100)

## 2014-10-05 LAB — CBC WITH DIFFERENTIAL/PLATELET
BASOS PCT: 1 % (ref 0–1)
Basophils Absolute: 0 10*3/uL (ref 0.0–0.1)
EOS ABS: 0 10*3/uL (ref 0.0–0.7)
Eosinophils Relative: 1 % (ref 0–5)
HCT: 35.3 % — ABNORMAL LOW (ref 39.0–52.0)
Hemoglobin: 12.1 g/dL — ABNORMAL LOW (ref 13.0–17.0)
Lymphocytes Relative: 24 % (ref 12–46)
Lymphs Abs: 1.5 10*3/uL (ref 0.7–4.0)
MCH: 30.8 pg (ref 26.0–34.0)
MCHC: 34.3 g/dL (ref 30.0–36.0)
MCV: 89.8 fL (ref 78.0–100.0)
MONO ABS: 0.6 10*3/uL (ref 0.1–1.0)
MONOS PCT: 10 % (ref 3–12)
Neutro Abs: 4.1 10*3/uL (ref 1.7–7.7)
Neutrophils Relative %: 64 % (ref 43–77)
PLATELETS: 176 10*3/uL (ref 150–400)
RBC: 3.93 MIL/uL — ABNORMAL LOW (ref 4.22–5.81)
RDW: 18.2 % — AB (ref 11.5–15.5)
WBC: 6.3 10*3/uL (ref 4.0–10.5)

## 2014-10-05 LAB — COMPREHENSIVE METABOLIC PANEL
ALBUMIN: 3.6 g/dL (ref 3.5–5.0)
ALK PHOS: 57 U/L (ref 38–126)
ALT: 29 U/L (ref 17–63)
ANION GAP: 10 (ref 5–15)
AST: 31 U/L (ref 15–41)
BILIRUBIN TOTAL: 0.6 mg/dL (ref 0.3–1.2)
BUN: 23 mg/dL — ABNORMAL HIGH (ref 6–20)
CO2: 24 mmol/L (ref 22–32)
Calcium: 8.7 mg/dL — ABNORMAL LOW (ref 8.9–10.3)
Chloride: 106 mmol/L (ref 101–111)
Creatinine, Ser: 1.3 mg/dL — ABNORMAL HIGH (ref 0.61–1.24)
GFR calc non Af Amer: 52 mL/min — ABNORMAL LOW (ref >60)
GFR, EST AFRICAN AMERICAN: 60 mL/min — AB (ref >60)
GLUCOSE: 87 mg/dL (ref 65–99)
Potassium: 3.8 mmol/L (ref 3.5–5.1)
Sodium: 140 mmol/L (ref 135–145)
TOTAL PROTEIN: 6.3 g/dL — AB (ref 6.5–8.1)

## 2014-10-05 LAB — TROPONIN I

## 2014-10-05 LAB — PROTIME-INR
INR: 2.4 — AB (ref 0.00–1.49)
PROTHROMBIN TIME: 25.9 s — AB (ref 11.6–15.2)

## 2014-10-05 LAB — ETHANOL

## 2014-10-05 LAB — SAMPLE TO BLOOD BANK

## 2014-10-05 MED ORDER — MORPHINE SULFATE (PF) 2 MG/ML IV SOLN
1.0000 mg | INTRAVENOUS | Status: DC | PRN
  Administered 2014-10-05: 1 mg via INTRAVENOUS
  Filled 2014-10-05: qty 1

## 2014-10-05 MED ORDER — SODIUM CHLORIDE 0.9 % IV SOLN
INTRAVENOUS | Status: DC
  Administered 2014-10-05: 15:00:00 via INTRAVENOUS

## 2014-10-05 MED ORDER — IOHEXOL 300 MG/ML  SOLN
100.0000 mL | Freq: Once | INTRAMUSCULAR | Status: AC | PRN
  Administered 2014-10-05: 100 mL via INTRAVENOUS

## 2014-10-05 NOTE — ED Notes (Signed)
Patient getting dressed.

## 2014-10-05 NOTE — ED Notes (Signed)
Patient returned from CT and Xray

## 2014-10-05 NOTE — ED Notes (Addendum)
Pt reports he was drinking alcohol last night was going up stairs was almost to top of stairs and fell back down stairs, possibly around 15 steps.  Bruising and hematoma to right forehead and eye, left knee pain, right hand abrasion right and left lateral neck and shoulder pain and mid chest pain.  When wife heard pt she ran to his side and pt had eyes open that were fluttering.  Pt thinks he could have LOC for brief time.  No shortness of breath, dizziness, blurred vision. A&Ox4.  Pt also reports he usually cough up phlegm everyday but today it has been blood streaked/

## 2014-10-05 NOTE — ED Provider Notes (Signed)
CSN: [id number]     Arrival date & time 10/05/14  1308 History   First MD Initiated Contact with Patient 10/05/14 1352     Chief Complaint  Patient presents with  . Fall  . Neck Pain  . Knee Pain  . Hand Injury  . Hemoptysis      HPI Pt was seen at 1400. Per pt and his wife, c/o sudden onset and resolution of one episode of fall down steps that occurred last night approximately 2300. Pt states he "was really drunk" and "fell down the steps." States he thinks he was near the top and fell down approximately 15 steps. Pt's wife was in bed, heard the fall, and found pt on the floor. States pt was awake. Pt states he "isn't sure" if he had LOC. Pt c/o facial contusion, left sided neck pain, right hand/wrist pain, left knee pain since the fall. Pt has been ambulatory since the fall. Pt states he "always coughs up phlegm but today it had streaks of blood in it." Denies CP/SOB, no abd pain, no N/V/D, no focal motor weakness, no tingling/numbness in extremities.   Td UTD Past Medical History  Diagnosis Date  . Alcohol abuse, episodic 05/19/2008  . ALLERGIC RHINITIS 04/15/2009  . COLONIC POLYPS, ADENOMATOUS, HX OF 02/14/2008  . DIVERTICULOSIS, COLON 02/14/2008  . ESOPHAGEAL STRICTURE 01/28/2008  . GERD 02/14/2008  . HYPERGLYCEMIA 11/16/2006  . HYPERLIPIDEMIA 02/14/2008  . INSOMNIA-SLEEP DISORDER-UNSPEC 02/11/2009  . 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 PROSTATE CANCER, HX OF 02/25/2000  . TRANSIENT ISCHEMIC ATTACKS, HX OF 11/16/2006  . Arthritis   . Atrial fibrillation 11/16/2006    remote CHF related to atrial fib with rapid ventricular response over 25 yrs per office note,  . ASTHMA 11/16/2006    sinusitis-    hx ?yeast patch/white patch on vocal cord as per [facility] 02/25/11-   . OBSTRUCTIVE SLEEP APNEA 11/10/2008  . SLEEP APNEA 10/03/2009    LOV Dr [name] 12/12 in EP[facility]    Moderate per patient- settings ?6/last sleep study years ago  . HYPERTENSION 11/16/2006  . Symptomatic bradycardia, secondary  to sinus node dysfunction 07/08/2011    s/p [facility] Scientific PPM by Dr Sallyanne Kuster, upgraded to [place] ICD 12/13 by Dr Rayann Heman (SJM)  . Myocardial infarction 1993  . Pacemaker     St. Jude  . ICD (implantable cardiac defibrillator) in place   . Anxiety   . Depression   . Pneumonia     hx of  . Headache(784.0)   . Cancer 2002    prostate cancer  . CORONARY ARTERY DISEASE 11/16/2006    LHC (07/2011): LAD with luminal irregularities, proximal circumflex 50%, EF 25%  . Chronic systolic CHF (congestive heart failure)     echo (10/02/12): EF 25-05%, grade 1 diastolic dysfunction, trivial AI, mild MR, mild RVE  . NICM (nonischemic cardiomyopathy)   . Stroke     Tia  . Lesion of vocal cord     bx begine  . Allergy     YEAR ROUND,TAKE SHOTS  . Sleep apnea   . Alzheimer's dementia     beginning with sx   Past Surgical History  Procedure Laterality Date  . Tonsilectomy, adenoidectomy, bilateral myringotomy and tubes    . Knee arthroscopy      2 surgeries right and 1 left  . Prostate surgery  2/02    prostate cancer  . Uvulopalatopharyngoplasty  2013  . Esophagogastroduodenoscopy      with dilitation  .  Lumbar laminectomy/decompression microdiscectomy  03/02/2011    Procedure: LUMBAR LAMINECTOMY/DECOMPRESSION MICRODISCECTOMY;  Surgeon: [name], MD;  Location: [facility];  Service: Orthopedics;  Laterality: N/A;  Decompression Lumbar 4 - Lumbar(X-Ray)  . Pacemaker insertion  06/2011    [employer] PPM implanted by Dr [employer]  . Cardiac defibrillator placement  01/16/12    Upgrade to a biventricular SJM ICD by DR Allred  . Tonsillectomy  as child  . Cardiac catheterization      several  . Back surgery    . Insert / replace / remove pacemaker    . Total knee arthroplasty Left 06/04/2012    Procedure: TOTAL KNEE ARTHROPLASTY;  Surgeon: [name], MD;  Location: [facility];  Service: Orthopedics;  Laterality: Left;  . Colonoscopy    . Vocal cord lesion bx    . Permanent pacemaker  insertion Left 07/08/2011    Procedure: PERMANENT PACEMAKER INSERTION;  Surgeon: [name], MD;  Location: [place] CATH LAB;  Service: Cardiovascular;  Laterality: Left;  . Left and right heart catheterization with coronary angiogram N/A 08/18/2011    Procedure: LEFT AND RIGHT HEART CATHETERIZATION WITH CORONARY ANGIOGRAM;  Surgeon: [name], MD;  Location: [place] CATH LAB;  Service: Cardiovascular;  Laterality: N/A;  . Bi-ventricular implantable cardioverter defibrillator upgrade N/A 01/16/2012    Procedure: BI-VENTRICULAR IMPLANTABLE CARDIOVERTER DEFIBRILLATOR UPGRADE;  Surgeon: Thompson Grayer, MD;  Location: [facility] CATH LAB;  Service: Cardiovascular;  Laterality: N/A;   Family History  Problem Relation Age of Onset  . Heart attack Father   . Heart attack Brother   . Heart disease Maternal Uncle   . Heart attack Maternal Uncle   . Colon cancer Neg Hx   . Esophageal cancer Neg Hx   . Rectal cancer Neg Hx   . Prostate cancer Neg Hx   . Stomach cancer Neg Hx    Social History  Substance Use Topics  . Smoking status: Former Smoker -- 1.00 packs/day for 4 years    Types: Cigarettes    Quit date: 03/22/1964  . Smokeless tobacco: Never Used     Comment: reports smoked socially  . Alcohol Use: 9.0 oz/week    15 Shots of liquor per week     Comment: 2 mixed drinks daily, some days    Review of Systems ROS: Statement: All systems negative except as marked or noted in the HPI; Constitutional: Negative for fever and chills. ; ; Eyes: Negative for eye pain, redness and discharge. ; ; ENMT: Negative for ear pain, hoarseness, nasal congestion, sinus pressure and sore throat. ; ; Cardiovascular: Negative for chest pain, palpitations, diaphoresis, dyspnea and peripheral edema. ; ; Respiratory: Negative for cough, wheezing and stridor. ; ; Gastrointestinal: Negative for nausea, vomiting, diarrhea, abdominal pain, blood in stool, hematemesis, jaundice and rectal bleeding. . ; ; Genitourinary: Negative for  dysuria, flank pain and hematuria. ; ; Musculoskeletal: +head injury, neck pain, knee pain, hand/wrist pain. Negative for back pain. Negative for swelling and deformity.; ; Skin: +abrasions. Negative for pruritus, rash, blisters, bruising and skin lesion.; ; Neuro: Negative for headache, lightheadedness and neck stiffness. Negative for weakness, altered level of consciousness , altered mental status, extremity weakness, paresthesias, involuntary movement, seizure and syncope.      Allergies  Hydrocodone and Oxycodone  Home Medications   Prior to Admission medications   Medication Sig Start Date End Date Taking? Authorizing Provider  acetaminophen (TYLENOL) 500 MG tablet Take 500 mg by mouth 2 (two) times daily.  Historical Provider, MD  albuterol (PROVENTIL HFA;VENTOLIN HFA) 108 (90 BASE) MCG/ACT inhaler Inhale 1-2 puffs into the lungs every 6 (six) hours as needed for shortness of breath. 02/04/14   Deneise Lever, MD  amiodarone (PACERONE) 200 MG tablet TAKE ONE-HALF TABLET DAILY Patient taking differently: TAKE ONE-HALF TABLET BY MOUTH DAILY 04/29/14   Thompson Grayer, MD  clonazePAM (KLONOPIN) 0.5 MG tablet TAKE 1/2 TO 2 TABLETS AS NEEDED FOR SLEEP 05/08/14   Deneise Lever, MD  CRESTOR 5 MG tablet TAKE ONE TABLET IN THE EVENING Patient taking differently: TAKE ONE TABLET BY MOUTH IN THE EVENING 11/20/13   Janith Lima, MD  cyanocobalamin (,VITAMIN B-12,) 1000 MCG/ML injection Inject 1000 mcg/ml weekly x 4 then monthly 06/18/14   Lafayette Dragon, MD  folic acid (FOLVITE) 1 MG tablet Take 1 tablet (1 mg total) by mouth daily. 11/13/13   Janith Lima, MD  metoprolol succinate (TOPROL-XL) 50 MG 24 hr tablet TAKE 1/2 TABLET AT NIGHT 07/08/14   Thompson Grayer, MD  mometasone (NASONEX) 50 MCG/ACT nasal spray TWO SPRAYS IN EACH NOSTRIL DAILY AS NEEDED FOR ALLERGIES 01/23/13   Deneise Lever, MD  NONFORMULARY OR COMPOUNDED ITEM Allergy Vaccine 1:10 Given at [facility]    Historical Provider,  MD  pantoprazole (PROTONIX) 40 MG tablet TAKE ONE TABLET BY MOUTH ONCE DAILY 07/07/14   Lafayette Dragon, MD  polyethylene glycol powder (GLYCOLAX/MIRALAX) powder Take 17 g by mouth daily as needed (constipation).  02/05/13   Lafayette Dragon, MD  ramipril (ALTACE) 10 MG capsule TAKE TWO CAPSULES EVERY DAY 05/16/14   Thompson Grayer, MD  rivastigmine (EXELON) 1.5 MG capsule TAKE ONE CAPSULE TWICE A DAY 09/24/14   Pieter Partridge, DO  spironolactone (ALDACTONE) 25 MG tablet TAKE ONE TABLET EACH DAY 06/05/14   Thompson Grayer, MD  warfarin (COUMADIN) 2.5 MG tablet Take as directed by coumadin clinic 07/07/14   Thompson Grayer, MD   BP 153/81 mmHg  Pulse 72  Temp(Src) 97.6 F (36.4 C) (Oral)  Resp 14  Ht 6\' 1"  (1.854 m)  Wt 195 lb (88.451 kg)  BMI 25.73 kg/m2  SpO2 97% Physical Exam  1405: Physical examination: Vital signs and O2 SAT: Reviewed; Constitutional: Well developed, Well nourished, Well hydrated, In no acute distress; Head and Face: Normocephalic, No scalp hematomas, no lacs. +contusion to right forehead and right periorbital areas.  Non-tender to palp superior and inferior orbital rim areas.  No zygoma tenderness.  No mandibular tenderness.; Eyes: EOMI, PERRL, No scleral icterus. No conjunctival injection. No hyphema bilat.; ENMT: Mouth and pharynx normal, Left TM normal, Right TM normal, Mucous membranes moist, +teeth and tongue intact.  No intraoral or intranasal bleeding.  No septal hematomas.  No trismus, no malocclusion.;  Neck: No edema, no soft tissue crepitus. Trachea midline; Spine: +TTP left hypertonic trapezius muscle. No abrasions or ecchymosis. No midline CS, TS, LS tenderness.; Cardiovascular: Regular rate and rhythm, No gallop; Respiratory: Breath sounds clear & equal bilaterally, No wheezes, Normal respiratory effort/excursion; Chest: Nontender, No deformity, Movement normal, No crepitus, No abrasions or ecchymosis.; Abdomen: Soft, Nontender, Nondistended, Normal bowel sounds, No abrasions or  ecchymosis.; Genitourinary: No CVA tenderness;; Extremities: No deformity, +abrasion/skin tear right dorsal hand. Right wrist, hand and fingers NT to palp. No snuffbox tenderness.  No pain to axial thumb or 3rd MCP loading.  Forearm compartments soft, strong radial pp, brisk cap refill in fingers. Hand NMS intact. No deformity, no ecchymosis, no edema, no erythema. +FROM  left knee, including able to lift extended LLE off stretcher, and extend left lower leg against resistance.  No ligamentous laxity.  No patellar or quad tendon step-offs.  NMS intact left foot, strong pedal pp. +plantarflexion of right foot w/calf squeeze.  No palpable gap left Achilles's tendon.  No proximal fibular head tenderness.  No edema, erythema, warmth, ecchymosis or deformity.  No specific area of point tenderness.  Full range of motion major/large joints of bilat UE's and LE's without pain or tenderness to palp, Neurovascularly intact, Pulses normal, No tenderness, No edema, Pelvis stable; Neuro: AA&Ox3, GCS 15.  Major CN grossly intact. Speech clear. Grips equal. Strength 5/5 equal bilat UE's and LE's. No gross focal motor or sensory deficits in extremities.; Skin: Color normal, Warm, Dry   ED Course  Procedures (including critical care time) Labs Review   Imaging Review  I have personally reviewed and evaluated these images and lab results as part of my medical decision-making.   EKG Interpretation   Date/Time:  Sunday October 05 2014 13:31:42 EDT Ventricular Rate:  67 PR Interval:  232 QRS Duration: 152 QT Interval:  506 QTC Calculation: 534 R Axis:   -119 Text Interpretation:  AV dual-paced rhythm with prolonged AV conduction  Biventricular pacemaker detected When compared with ECG of 01/17/2012 No  significant change was found Confirmed by [facility]  MD, Nunzio Cory [id number]) on  10/05/2014 2:11:20 PM      MDM  MDM Reviewed: previous chart, nursing note and vitals Reviewed previous: labs and  ECG Interpretation: labs, ECG, x-ray and CT scan   Results for orders placed or performed during the hospital encounter of 10/05/14  Protime-INR  Result Value Ref Range   Prothrombin Time 25.9 (H) 11.6 - 15.2 seconds   INR 2.40 (H) 0.00 - 1.49  CBC with Differential  Result Value Ref Range   WBC 6.3 4.0 - 10.5 K/uL   RBC 3.93 (L) 4.22 - 5.81 MIL/uL   Hemoglobin 12.1 (L) 13.0 - 17.0 g/dL   HCT 35.3 (L) 39.0 - 52.0 %   MCV 89.8 78.0 - 100.0 fL   MCH 30.8 26.0 - 34.0 pg   MCHC 34.3 30.0 - 36.0 g/dL   RDW 18.2 (H) 11.5 - 15.5 %   Platelets 176 150 - 400 K/uL   Neutrophils Relative % 64 43 - 77 %   Neutro Abs 4.1 1.7 - 7.7 K/uL   Lymphocytes Relative 24 12 - 46 %   Lymphs Abs 1.5 0.7 - 4.0 K/uL   Monocytes Relative 10 3 - 12 %   Monocytes Absolute 0.6 0.1 - 1.0 K/uL   Eosinophils Relative 1 0 - 5 %   Eosinophils Absolute 0.0 0.0 - 0.7 K/uL   Basophils Relative 1 0 - 1 %   Basophils Absolute 0.0 0.0 - 0.1 K/uL  Ethanol  Result Value Ref Range   Alcohol, Ethyl (B) <5 <5 mg/dL  Troponin I  Result Value Ref Range   Troponin I <0.03 <0.031 ng/mL  Comprehensive metabolic panel  Result Value Ref Range   Sodium 140 135 - 145 mmol/L   Potassium 3.8 3.5 - 5.1 mmol/L   Chloride 106 101 - 111 mmol/L   CO2 24 22 - 32 mmol/L   Glucose, Bld 87 65 - 99 mg/dL   BUN 23 (H) 6 - 20 mg/dL   Creatinine, Ser 1.30 (H) 0.61 - 1.24 mg/dL   Calcium 8.7 (L) 8.9 - 10.3 mg/dL   Total Protein 6.3 (L) 6.5 -  8.1 g/dL   Albumin 3.6 3.5 - 5.0 g/dL   AST 31 15 - 41 U/L   ALT 29 17 - 63 U/L   Alkaline Phosphatase 57 38 - 126 U/L   Total Bilirubin 0.6 0.3 - 1.2 mg/dL   GFR calc non Af Amer 52 (L) >60 mL/min   GFR calc Af Amer 60 (L) >60 mL/min   Anion gap 10 5 - 15  I-Stat Chem 8, ED  Result Value Ref Range   Sodium 143 135 - 145 mmol/L   Potassium 3.8 3.5 - 5.1 mmol/L   Chloride 106 101 - 111 mmol/L   BUN 27 (H) 6 - 20 mg/dL   Creatinine, Ser 1.30 (H) 0.61 - 1.24 mg/dL   Glucose, Bld 86 65 - 99  mg/dL   Calcium, Ion 1.19 1.13 - 1.30 mmol/L   TCO2 25 0 - 100 mmol/L   Hemoglobin 12.2 (L) 13.0 - 17.0 g/dL   HCT 36.0 (L) 39.0 - 52.0 %  Sample to Blood Bank  Result Value Ref Range   Blood Bank Specimen SAMPLE AVAILABLE FOR TESTING    Sample Expiration 10/06/2014    *Note: Due to a large number of results and/or encounters for the requested time period, some results have not been displayed. A complete set of results can be found in Results Review.   Dg Wrist Complete Right 10/05/2014   CLINICAL DATA:  Fall.  EXAM: RIGHT WRIST - COMPLETE 3+ VIEW  COMPARISON:  03/30/2010  FINDINGS: There is no evidence of fracture or dislocation. There is no evidence of arthropathy or other focal bone abnormality. Soft tissues are unremarkable.  IMPRESSION: Negative.   Electronically Signed   By: Kerby Moors M.D.   On: 10/05/2014 16:04   Ct Head Wo Contrast 10/05/2014   CLINICAL DATA:  Fall.  Head trauma.  Fall down stairs.  EXAM: CT HEAD WITHOUT CONTRAST  CT MAXILLOFACIAL WITHOUT CONTRAST  CT CERVICAL SPINE WITHOUT CONTRAST  TECHNIQUE: Multidetector CT imaging of the head, cervical spine, and maxillofacial structures were performed using the standard protocol without intravenous contrast. Multiplanar CT image reconstructions of the cervical spine and maxillofacial structures were also generated.  COMPARISON:  01/28/2014.  FINDINGS: CT HEAD FINDINGS  No mass lesion, mass effect, midline shift, hydrocephalus, hemorrhage. No acute territorial cortical ischemia/infarct. Atrophy and chronic ischemic white matter disease is present. Chronic LEFT MCA infarct. Mastoid air cells and middle ears appear clear. Calvarium intact. Intracranial atherosclerosis.  CT MAXILLOFACIAL FINDINGS  Globes: Intact  Bony orbits:  Intact  Pterygoid plates: Intact  Mandibular condyles:  Located  Mandible: Intact.  Torus mandibularis is present.  Intracranial contents:  See above.  Paranasal sinuses: Mucosal thickening is present in the  posterior RIGHT maxillary sinus. LEFT maxillary sinus appears normal. Frontal sinuses are within normal limits.  Mastoid air cells: Opacification of a few inferior RIGHT mastoid air cells. LEFT mastoid air cells clear.  Nasal bones: Intact  Soft tissues: Normal  Visible cervical spine: See below.  CT CERVICAL SPINE FINDINGS  Alignment: Straightening of the normal cervical lordosis. 2 mm anterolisthesis of C7 on T1 appears degenerative and facet mediated. Dextroconvex torticollis.  Craniocervical junction: The odontoid is intact. Occipital condyles intact. Normal C1 ring.  Vertebrae: Negative for fracture or aggressive osseous lesions.  Paraspinal soft tissues: Carotid atherosclerosis. 4 mm low-density lesion in the LEFT thyroid lobe does not merit further evaluation.  Lung apices: Grossly normal.  Severe cervical spine degenerative disease is present. This involves both discs  and facets. There is ankylosis at C3-C4 with ossification of the disc. The fusion is solid. Bilateral severe facet arthrosis is present with multilevel foraminal stenosis due to a combination of uncovertebral spurring and facet spurring. Disc osteophyte complexes are most pronounced at C5-C6 and C6-C7 producing mild central stenosis.  IMPRESSION: 1. No acute intracranial abnormality. Atrophy, chronic ischemic white matter disease and old LEFT MCA infarct. 2. Negative for facial fracture. 3. Moderate to severe cervical spine degenerative disease. No cervical spine fracture or dislocation.   Electronically Signed   By: Dereck Ligas M.D.   On: 10/05/2014 16:11   Ct Chest W Contrast 10/05/2014   CLINICAL DATA:  Fall down 15 steps last evening. Back pain. Prostate cancer. Dementia. Initial encounter.  EXAM: CT CHEST, ABDOMEN, AND PELVIS WITH CONTRAST  TECHNIQUE: Multidetector CT imaging of the chest, abdomen and pelvis was performed following the standard protocol during bolus administration of intravenous contrast.  CONTRAST:  127mL OMNIPAQUE  IOHEXOL 300 MG/ML  SOLN  COMPARISON:  One-view chest x-ray from the same day. CTA chest 03/30/2010. CT abdomen and pelvis 12/26/2013.  FINDINGS: CT CHEST FINDINGS  The heart size is normal. Pacing wires are in place. Coronary artery calcifications are present. Atherosclerotic calcifications are present at the aortic arch. Calcified lymph nodes are present in the left hilum. No other significant mediastinal or axillary nodes are present.  A subcentimeter left thyroid nodule is present. The thoracic inlet is otherwise within normal limits. The esophagus is unremarkable.  Mild dependent atelectasis is present bilaterally. Calcified nodules are noted in the lingula. The combination suggests a history of granulomatous disease. No active disease is present.  Mild degenerative changes are present in the thoracic spine. No acute fracture or traumatic subluxation is evident. The ribs are intact.  CT ABDOMEN AND PELVIS FINDINGS  The liver is within normal limits. Calcifications in the spleen are compatible with prior granulomatous disease.  The stomach, duodenum, and pancreas are unremarkable. The common bile duct and gallbladder are normal. The adrenal glands are normal bilaterally. A 7 mm cyst is noted near the upper pole of the right kidney. Kidneys are otherwise within normal limits bilaterally.  Extensive diverticular changes are present throughout the sigmoid colon. There is no focal inflammation suggest diverticulitis. The more proximal colon is unremarkable. The appendix is visualized and within normal limits. Small bowel is normal. No significant adenopathy or free fluid is present. Fat is herniated into the left inguinal canal without associated bowel. Atherosclerotic changes are noted in the abdominal aorta without aneurysm.  Degenerative changes are noted in the lower lumbar spine. A vacuum disc is present at each level from L2-3 through L5-S1. There is chronic loss of disc height and endplate osteophytes at  L5-S1. No acute fracture is evident.  IMPRESSION: 1. No evidence for acute trauma. 2. Degenerative changes throughout the thoracolumbar spine. 3. Evidence of prior granulomatous disease with calcified left hilar lymph nodes, granulomata in the lingula, and calcifications in the spleen. 4. Extensive sigmoid colonic diverticulosis without evidence for diverticulitis.   Electronically Signed   By: San Morelle M.D.   On: 10/05/2014 16:11   Ct Cervical Spine Wo Contrast 10/05/2014   CLINICAL DATA:  Fall.  Head trauma.  Fall down stairs.  EXAM: CT HEAD WITHOUT CONTRAST  CT MAXILLOFACIAL WITHOUT CONTRAST  CT CERVICAL SPINE WITHOUT CONTRAST  TECHNIQUE: Multidetector CT imaging of the head, cervical spine, and maxillofacial structures were performed using the standard protocol without intravenous contrast. Multiplanar CT image reconstructions  of the cervical spine and maxillofacial structures were also generated.  COMPARISON:  01/28/2014.  FINDINGS: CT HEAD FINDINGS  No mass lesion, mass effect, midline shift, hydrocephalus, hemorrhage. No acute territorial cortical ischemia/infarct. Atrophy and chronic ischemic white matter disease is present. Chronic LEFT MCA infarct. Mastoid air cells and middle ears appear clear. Calvarium intact. Intracranial atherosclerosis.  CT MAXILLOFACIAL FINDINGS  Globes: Intact  Bony orbits:  Intact  Pterygoid plates: Intact  Mandibular condyles:  Located  Mandible: Intact.  Torus mandibularis is present.  Intracranial contents:  See above.  Paranasal sinuses: Mucosal thickening is present in the posterior RIGHT maxillary sinus. LEFT maxillary sinus appears normal. Frontal sinuses are within normal limits.  Mastoid air cells: Opacification of a few inferior RIGHT mastoid air cells. LEFT mastoid air cells clear.  Nasal bones: Intact  Soft tissues: Normal  Visible cervical spine: See below.  CT CERVICAL SPINE FINDINGS  Alignment: Straightening of the normal cervical lordosis. 2 mm  anterolisthesis of C7 on T1 appears degenerative and facet mediated. Dextroconvex torticollis.  Craniocervical junction: The odontoid is intact. Occipital condyles intact. Normal C1 ring.  Vertebrae: Negative for fracture or aggressive osseous lesions.  Paraspinal soft tissues: Carotid atherosclerosis. 4 mm low-density lesion in the LEFT thyroid lobe does not merit further evaluation.  Lung apices: Grossly normal.  Severe cervical spine degenerative disease is present. This involves both discs and facets. There is ankylosis at C3-C4 with ossification of the disc. The fusion is solid. Bilateral severe facet arthrosis is present with multilevel foraminal stenosis due to a combination of uncovertebral spurring and facet spurring. Disc osteophyte complexes are most pronounced at C5-C6 and C6-C7 producing mild central stenosis.  IMPRESSION: 1. No acute intracranial abnormality. Atrophy, chronic ischemic white matter disease and old LEFT MCA infarct. 2. Negative for facial fracture. 3. Moderate to severe cervical spine degenerative disease. No cervical spine fracture or dislocation.   Electronically Signed   By: Dereck Ligas M.D.   On: 10/05/2014 16:11   Ct Abdomen Pelvis W Contrast 10/05/2014   CLINICAL DATA:  Fall down 15 steps last evening. Back pain. Prostate cancer. Dementia. Initial encounter.  EXAM: CT CHEST, ABDOMEN, AND PELVIS WITH CONTRAST  TECHNIQUE: Multidetector CT imaging of the chest, abdomen and pelvis was performed following the standard protocol during bolus administration of intravenous contrast.  CONTRAST:  138mL OMNIPAQUE IOHEXOL 300 MG/ML  SOLN  COMPARISON:  One-view chest x-ray from the same day. CTA chest 03/30/2010. CT abdomen and pelvis 12/26/2013.  FINDINGS: CT CHEST FINDINGS  The heart size is normal. Pacing wires are in place. Coronary artery calcifications are present. Atherosclerotic calcifications are present at the aortic arch. Calcified lymph nodes are present in the left hilum. No  other significant mediastinal or axillary nodes are present.  A subcentimeter left thyroid nodule is present. The thoracic inlet is otherwise within normal limits. The esophagus is unremarkable.  Mild dependent atelectasis is present bilaterally. Calcified nodules are noted in the lingula. The combination suggests a history of granulomatous disease. No active disease is present.  Mild degenerative changes are present in the thoracic spine. No acute fracture or traumatic subluxation is evident. The ribs are intact.  CT ABDOMEN AND PELVIS FINDINGS  The liver is within normal limits. Calcifications in the spleen are compatible with prior granulomatous disease.  The stomach, duodenum, and pancreas are unremarkable. The common bile duct and gallbladder are normal. The adrenal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

## 2014-10-05 NOTE — Discharge Instructions (Signed)
°Emergency Department Resource Guide °1) Find a Doctor and Pay Out of Pocket °Although you won't have to find out who is covered by your insurance plan, it is a good idea to ask around and get recommendations. You will then need to call the office and see if the doctor you have chosen will accept you as a new patient and what types of options they offer for patients who are self-pay. Some doctors offer discounts or will set up payment plans for their patients who do not have insurance, but you will need to ask so you aren't surprised when you get to your appointment. ° °2) Contact Your Local Health Department °Not all health departments have doctors that can see patients for sick visits, but many do, so it is worth a call to see if yours does. If you don't know where your local health department is, you can check in your phone book. The CDC also has a tool to help you locate your state's health department, and many state websites also have listings of all of their local health departments. ° °3) Find a Walk-in Clinic °If your illness is not likely to be very severe or complicated, you may want to try a walk in clinic. These are popping up all over the country in pharmacies, drugstores, and shopping centers. They're usually staffed by nurse practitioners or physician assistants that have been trained to treat common illnesses and complaints. They're usually fairly quick and inexpensive. However, if you have serious medical issues or chronic medical problems, these are probably not your best option. ° °No Primary Care Doctor: °- Call Health Connect at  832-8000 - they can help you locate a primary care doctor that  accepts your insurance, provides certain services, etc. °- Physician Referral Service- 1-800-533-3463 ° °Chronic Pain Problems: °Organization         Address  Phone   Notes  °[facility]  ([phone]) [phone] Patients need to be referred by their primary care doctor.  ° °Medication  Assistance: °Organization         Address  Phone   Notes  °[place] [facility] [address] °[place], [place] [address] (336) 641-8030 --Must be a resident of [place] °-- Must have NO insurance coverage whatsoever (no Medicaid/ Medicare, etc.) °-- The pt. MUST have a primary care doctor that directs their care regularly and follows them in the community °  °[facility]  (866) 331-1348   °[employer]  (888) 892-1162   ° °Agencies that provide inexpensive medical care: °Organization         Address  Phone   Notes  °[facility]  (336) 832-8035   °[facility]    (336) 832-7272   °[facility] [address] °[place], [place] [address] (336) 832-4777   °[facility] [address], °[place] (336) 271-4999   °[facility]    (336) 373-0678   °[facility]    (336) 272-1050   °[facility] ° [address], [place] Phone:  (336) 832-4444, Fax:  (336) 832-4440 Hours of Operation:  9 am - 6 pm, M-F.  Also accepts Medicaid/Medicare and self-pay.  °[facility] ° [address], [place] Phone: (336) [phone], Fax: (336) [phone]. Hours of Operation:  8:30 am - 5:30 pm, M-F.  Also accepts Medicaid and self-pay.  °[facility] [place] [address], [place] Phone: (336) 878-6027   °[facility] [address], [place], [place] (336)723-1848, Ext. 123 Mondays & Thursdays: 7-9 AM.  First 15 patients are seen on a first come, first serve basis. °  ° °Medicaid-accepting [place] Providers: ° °Organization         Address  Phone   Notes  °[facility] [address], [place] (336) 641-2100 Also accepts self-pay patients.  °[facility] [address], [place] ° (336) [phone]   °[facility] [address], [place]  (336) 288-8857   °[facility] [address] [place] [address], [place] (336) 299-7000   °Veita Bland [address], [place]  ° (336) 373-1557 Only accepts [place] Access Medicaid patients after they have their name applied to their card.  ° °Self-Pay (no insurance) in [place]: ° °Organization         Address  Phone   Notes  °Sickle Cell Patients, [facility] [address], [place] (336) 832-1970   °[facility] [address], [place] (336) 832-4400   °[facility] ° [address], [place] (336) 992-4800   °[facility]/Dr. Osei-Bonsu ° [address] [place] [address], [place] or [address], [place] (336) [phone] Phone number for both [place] and [place] locations is the same.  °[facility] [address], [place] (336) 299-0000   °[facility] [address] [place] [address], [place] or [address] (336) 852-7530 °(336) 878-2260   °[facility] [address], [place] (336) 350-1642, phone; (336) 294-5005, fax Sees patients 1st and 3rd Saturday of every month.  Must not qualify for public or private insurance (i.e. Medicaid, Medicare, [place] Health Choice, Veterans' Benefits) • Household income should be no more than 200% of the poverty level •The clinic cannot treat you if you are pregnant or think you are pregnant • Sexually transmitted diseases are not treated at the clinic.  ° ° °Dental Care: °Organization         Address  Phone  Notes  °[place] [facility] [address], [place] (336) 641-6152 Accepts children up to age 21 who are enrolled in Medicaid or [place] Health Choice; pregnant women with a Medicaid card; and children who have applied for Medicaid or [place] Health Choice, but were declined, whose parents can pay a reduced fee at time of service.  °[place]  [facility] [place]  [address], [place] (336) 641-7733 Accepts children up to age 21 who are enrolled in Medicaid or [place] Health Choice; pregnant women with a Medicaid card; and children who have applied for Medicaid or [place] Health Choice, but were declined, whose parents can pay a reduced fee at time of service.  °[facility] ° [address],  (336) 641-4533 Patients are seen by appointment only. Walk-ins are not accepted. [facility] will see patients 18 years of age and older. °Monday - Tuesday (8am-5pm) °Most Wednesdays (8:30-5pm) °$30 per visit, cash only  °[facility] ° [address], [place] (336) [phone] Patients are seen by appointment only. Walk-ins are not accepted. [facility] will see patients 18 years of age and older. °One   Wednesday Evening (Monthly: Volunteer Based).  $30 per visit, cash only  °[facility]  (919) [phone] for adults; Children under age 4, call Graduate Pediatric Dentistry at ([phone]) [phone]. Children aged 4-14, please call ([phone]) [phone] to request a pediatric application. ° Dental services are provided in all areas of dental care including fillings, crowns and bridges, complete and partial dentures, implants, gum treatment, root canals, and extractions. Preventive care is also provided. Treatment is provided to both adults and children. °Patients are selected via a lottery and there is often a waiting list. °  °[facility] [address], °[place] ° (336) 763-8833 [email or web address] °  °[facility] [address], [place], [place] (336)723-1848, Ext. 123 Second and Fourth Thursday of each month, opens at 6:30 AM; Clinic ends at 9 AM.  Patients are seen on a first-come first-served basis, and a limited number are seen during each clinic.  ° °[facility] ° [address], [place], [place] (336) [phone]    Eligibility Requirements °You must have lived in [place], [place], or [place] counties for at least the last three months. °  You cannot be eligible for state or federal sponsored healthcare insurance, including Veterans Administration, Medicaid, or Medicare. °  You generally cannot be eligible for healthcare insurance through your employer.  °  How to apply: °Eligibility screenings are held every Tuesday and Wednesday afternoon from 1:00 pm until 4:00 pm. You do not need an appointment for the interview!  °[facility] [address], [place], [place] 336-631-2330   °[facility]  336-342-8273   °[facility]  336-703-3100   °[facility]  336-570-6415   ° °Behavioral Health Resources in the Community: °Intensive Outpatient Programs °Organization         Address  Phone  Notes  °[facility] [address], [place], [place] 336-878-6098   °[facility] [address], [place], [place] 336-832-9800   °[facility] [address], [place], [place] ° 336-882-2125   °[facility] [address],  °[place], [place] 1-800-853-5163 or 336-641-4981   °Substance Abuse Resources °Organization         Address  Phone  Notes  °[facility]  336-882-2125   °[facility]  336-784-9470   °[facility]  336-285-9073   °[facility]  336-845-3988   °Residential & Outpatient Substance Abuse Program  1-800-659-3381   °Psychological Services °Organization         Address  Phone  Notes  °[facility]  336- 832-9600   °[facility]  336- 378-7881   °[facility] [address], [place] 1-800-853-5163 or 336-641-4981   ° °Mobile Crisis Teams °Organization         Address  Phone  Notes  °[facility], Mobile Crisis Care Unit  1-877-626-1772   °Assertive °[facility] ° [address]  [place], [place] 336-834-9664   °Sharon DeEsch [address] °[place] [employer] 336-554-5454   ° °Self-Help/Support Groups °Organization         Address  Phone             Notes  °[facility] [place] - variety of support groups  336- 373-1402 Call for more information  °Narcotics Anonymous (NA), [facility] [address], °[place]  2 meetings at this location  ° °  Residential Treatment Programs Organization         Address  Phone  Notes  [facility] [address],    [place]  1-249-485-2329   [facility]  [address] [place] [address], [place], [place]   [place], [place] (787) 294-3361 Admissions: 8am-3pm M-F  [facility] [place] [address],    [place], [place] 737-106-2694   [facility] [address] [place], [place], [place]   [facility] [address],  [place], [place]   [facility] - Intensive Outpatient [address], [place], [place]   [facility] [address]  [place], [place] 1-972-014-3997 or 863-333-3224   [facility] [address] [place], [place] Accepts Medicaid  [facility] [place] [address],  [place] 1-854-133-2211 Substance Abuse/Addiction Treatment   [facility]         Address  Phone  Notes  [facility]  667-142-8673   Domenic Schwab, PhD [address] Arlis Porta [place], [place]   610-209-8692 or 618-858-3084   [facility] [place], [place] 317-517-4096   [facility] [address], [place], [place] 413-534-4372 Insurance/Medicaid/sponsorship through [facility] [address]                                    [place], [place] [phone] [place] [address], [place] (607)[phone]    Dr. Adele Schilder  302-116-9941   [facility] 1) [address], [place] 2) [place] 3)  [address], [place] 215-722-4164 508-728-9181  [phone]   [place] 417-270-4239 or 908-469-1449 (After Hours)      Take your usual prescriptions as previously directed. Take over the counter tylenol, as directed on packaging, as needed for discomfort. Apply moist heat or ice to the area(s) of discomfort, for 15 minutes at a time, several times per day for the next few days.  Do not fall asleep on a heating or ice pack.  Wash the abraded area with soap and water at least twice a day, and cover with a clean/dry dressing.  Change the dressing whenever it becomes wet or soiled after washing the area with soap and water.  Call your regular medical doctor on Monday to schedule a follow up appointment in the next 2 days.  Return to the Emergency Department immediately if worsening.

## 2014-10-05 NOTE — ED Notes (Signed)
Dr. Thurnell Garbe at bedside.  Patient to be discharged.

## 2014-10-05 NOTE — ED Notes (Signed)
Pt being transported to CT

## 2014-10-06 ENCOUNTER — Ambulatory Visit (INDEPENDENT_AMBULATORY_CARE_PROVIDER_SITE_OTHER): Payer: Medicare Other | Admitting: Neurology

## 2014-10-06 ENCOUNTER — Encounter: Payer: Self-pay | Admitting: Cardiology

## 2014-10-06 ENCOUNTER — Encounter: Payer: Self-pay | Admitting: Neurology

## 2014-10-06 VITALS — BP 126/72 | HR 85 | Temp 97.6°F | Resp 16 | Ht 73.0 in | Wt 200.1 lb

## 2014-10-06 DIAGNOSIS — G3184 Mild cognitive impairment, so stated: Secondary | ICD-10-CM

## 2014-10-06 DIAGNOSIS — I255 Ischemic cardiomyopathy: Secondary | ICD-10-CM

## 2014-10-06 MED ORDER — RIVASTIGMINE TARTRATE 3 MG PO CAPS: 3.0000 mg | ORAL_CAPSULE | Freq: Two times a day (BID) | ORAL | Status: DC

## 2014-10-06 NOTE — Progress Notes (Signed)
NEUROLOGY FOLLOW UP OFFICE NOTE  EVEREST BROD [id number]  HISTORY OF PRESENT ILLNESS: Corey [name] is a 79 year old right-handed man with history of paroxysmal atrial fibrillation with PM, MI and CAD, TIA, hypertension, OSA, hyperlipidemia, alcohol abuse, and history of prostate cancer who follows up for amnestic [facility]CI.  He is accompanied by his wife, who provides some history.  ED note, labs and images of head and cervical CT reviewed.  UPDATE: He currently is on the Exelon 1.5mg  twice daily.  He was referred to Behavioral Medicine for depression, but he never made an appointment.  However, he says he is dealing with retirement better.  He is also more engaged with his grandchildren.  He has been feeling better since being treated for B12 deficiency.  Yesterday morning, he went to the ED after falling and hitting his head on the stairs the previous night when he was intoxicated.  CT of head and cervical spine showed no acute abnormality.      HISTORY: His wife and daughters note some mild memory problems for a little while, but has significantly been more noticeable over the past 5 months, since he retired in June.  Memory has been the primary problem.  He was called his daughters several times in a day asking about the same thing.  He still drives but is now mildly disoriented driving around home.  There has been no accidents or near-accidents, but he is more irritable and impulsive, and will drive a little faster and tail gate.  He has poor night vision.  They have a new grandson.  Recently, he forgot about a party for his new grandchild and instead made plans with friends.  He has begun to forget names of family and friends.  He has not had any significant change in personality other than irritability.  Besides the aggressive driving, he uses foul language a little more.  He does not act inappropriately.  He does feel depressed, as he has been going through an adjustment since retiring.  He  has had more falls recently.  One time, the rug slipped from underneath him.  Another time, he tripped over a hose at the gas station.  He also had experienced bowel incontinence.  He has not had any problems at work prior to retirement.  He has not had hallucinations or delusions. He has not had hallucinations or delusions.  He has not had difficulty performing everyday tasks.  He notes some problems but doesn't think it is as bad as his family makes it out to be.    He worked in [employer] for many years.  Since retirement, he typically doesn't do too much.  He will sit around.  He doesn't watch much TV during the day.  He reads the newspaper daily and able to retain information.  He is able to stay focus.  He and his wife are not as social as they used to be.    His mother had dementia.   Over the past couple of years, he has had several operations, including pacemaker and back surgery.  He attributes these changes to his multiple surgeries.  He also has depression.  He has been depressed for several years, triggered by several events in his life, such as pain due to multiple back surgeries and the passing of his daughter.  It has worsened particularly over the past two years.  He and his wife traveled around the country this past summer, but he didn't quite seem  to enjoy it as much as she should.  He doesn't have any desire to go out and be active or social.  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  His diet is poor, and he is prone to skipping meals.  He does not get much exercise due to chronic back problems.  His sleep is poor.  He wakes up several times during the night.  He takes an Ambien at times.  He is irritable and will blow up at his wife at times.  He is not physically abusive.  He has not been crying.    He did not tolerate Aricept due to vivid dreams.  PAST MEDICAL HISTORY: Past Medical History  Diagnosis Date  . Alcohol abuse, episodic 05/19/2008  .  ALLERGIC RHINITIS 04/15/2009  . COLONIC POLYPS, ADENOMATOUS, HX OF 02/14/2008  . DIVERTICULOSIS, COLON 02/14/2008  . ESOPHAGEAL STRICTURE 01/28/2008  . GERD 02/14/2008  . HYPERGLYCEMIA 11/16/2006  . HYPERLIPIDEMIA 02/14/2008  . INSOMNIA-SLEEP DISORDER-UNSPEC 02/11/2009  . 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 PROSTATE CANCER, HX OF 02/25/2000  . TRANSIENT ISCHEMIC ATTACKS, HX OF 11/16/2006  . Arthritis   . Atrial fibrillation 11/16/2006    remote CHF related to atrial fib with rapid ventricular response over 25 yrs per office note,  . ASTHMA 11/16/2006    sinusitis-    hx ?yeast patch/white patch on vocal cord as per [facility] 02/25/11-   . OBSTRUCTIVE SLEEP APNEA 11/10/2008  . SLEEP APNEA 10/03/2009    LOV Dr [name] 12/12 in EP[facility]    Moderate per patient- settings ?6/last sleep study years ago  . HYPERTENSION 11/16/2006  . Symptomatic bradycardia, secondary to sinus node dysfunction 07/08/2011    s/p [facility] Scientific PPM by Dr Sallyanne Kuster, upgraded to [place] ICD 12/13 by Dr Rayann Heman ([facility])  . Myocardial infarction 1993  . Pacemaker     St. Jude  . ICD (implantable cardiac defibrillator) in place   . Anxiety   . Depression   . Pneumonia     hx of  . Headache(784.0)   . Cancer 2002    prostate cancer  . CORONARY ARTERY DISEASE 11/16/2006    LHC (07/2011): LAD with luminal irregularities, proximal circumflex 50%, EF 25%  . Chronic systolic CHF (congestive heart failure)     echo (10/02/12): EF 43-32%, grade 1 diastolic dysfunction, trivial AI, mild MR, mild RVE  . NICM (nonischemic cardiomyopathy)   . Stroke     Tia  . Lesion of vocal cord     bx begine  . Allergy     YEAR ROUND,TAKE SHOTS  . Sleep apnea   . Alzheimer's dementia     beginning with sx    MEDICATIONS: Current Outpatient Prescriptions on File Prior to Visit  Medication Sig Dispense Refill  . albuterol (PROVENTIL HFA;VENTOLIN HFA) 108 (90 BASE) MCG/ACT inhaler Inhale 1-2 puffs into the lungs every 6 (six) hours as needed for  shortness of breath. 1 Inhaler 11  . amiodarone (PACERONE) 200 MG tablet TAKE ONE-HALF TABLET DAILY (Patient taking differently: TAKE ONE-HALF TABLET BY MOUTH DAILY) 45 tablet 1  . clonazePAM (KLONOPIN) 0.5 MG tablet TAKE 1/2 TO 2 TABLETS AS NEEDED FOR SLEEP 30 tablet 5  . CRESTOR 5 MG tablet TAKE ONE TABLET IN THE EVENING (Patient taking differently: TAKE ONE TABLET BY MOUTH IN THE EVENING) 30 tablet 11  . folic acid (FOLVITE) 1 MG tablet Take 1 tablet (1 mg total) by mouth daily. [place]  tablet 3  . metoprolol succinate (TOPROL-XL) 50 MG 24 hr tablet TAKE 1/2 TABLET AT NIGHT 30 tablet 5  . mometasone (NASONEX) 50 MCG/ACT nasal spray TWO SPRAYS IN EACH NOSTRIL DAILY AS NEEDED FOR ALLERGIES    . NONFORMULARY OR COMPOUNDED ITEM Allergy Vaccine 1:10 Given at [facility]    . pantoprazole (PROTONIX) 40 MG tablet TAKE ONE TABLET BY MOUTH ONCE DAILY 30 tablet 1  . polyethylene glycol powder (GLYCOLAX/MIRALAX) powder Take 17 g by mouth daily as needed (constipation).     . ramipril (ALTACE) 10 MG capsule TAKE TWO CAPSULES EVERY DAY 60 capsule 6  . spironolactone (ALDACTONE) 25 MG tablet TAKE ONE TABLET EACH DAY 30 tablet 5  . warfarin (COUMADIN) 2.5 MG tablet Take as directed by coumadin clinic 120 tablet 0  . cyanocobalamin (,VITAMIN B-12,) 1000 MCG/ML injection Inject 1000 mcg/ml weekly x 4 then monthly (Patient not taking: Reported on 10/06/2014) 4 mL 6   No current facility-administered medications on file prior to visit.    ALLERGIES: Allergies  Allergen Reactions  . Hydrocodone Other (See Comments)    Severe panic attacks  . Oxycodone Other (See Comments)    Severe panic attacks    FAMILY HISTORY: Family History  Problem Relation Age of Onset  . Heart attack Father   . Heart attack Brother   . Heart disease Maternal Uncle   . Heart attack Maternal Uncle   . Colon cancer Neg Hx   . Esophageal cancer Neg Hx   . Rectal cancer Neg Hx   . Prostate cancer Neg Hx   . Stomach cancer  Neg Hx     SOCIAL HISTORY: Social History   Social History  . Marital Status: Married    Spouse Name: N/A  . Number of Children: N/A  . Years of Education: N/A   Occupational History  . Not on file.   Social History Main Topics  . Smoking status: Former Smoker -- 1.00 packs/day for 4 years    Types: Cigarettes    Quit date: 03/22/1964  . Smokeless tobacco: Never Used     Comment: reports smoked socially  . Alcohol Use: 9.0 oz/week    15 Shots of liquor per week     Comment: 2 mixed drinks daily, some days  . Drug Use: No  . Sexual Activity:    Partners: Female   Other Topics Concern  . Not on file   Social History Narrative    REVIEW OF SYSTEMS: Constitutional: No fevers, chills, or sweats, no generalized fatigue, change in appetite Eyes: No visual changes, double vision, eye pain Ear, nose and throat: No hearing loss, ear pain, nasal congestion, sore throat Cardiovascular: No chest pain, palpitations Respiratory:  No shortness of breath at rest or with exertion, wheezes GastrointestinaI: No nausea, vomiting, diarrhea, abdominal pain, fecal incontinence Genitourinary:  No dysuria, urinary retention or frequency Musculoskeletal:  No neck pain, back pain Integumentary: No rash, pruritus, skin lesions Neurological: as above Psychiatric: No depression, insomnia, anxiety Endocrine: No palpitations, fatigue, diaphoresis, mood swings, change in appetite, change in weight, increased thirst Hematologic/Lymphatic:  No anemia, purpura, petechiae. Allergic/Immunologic: no itchy/runny eyes, nasal congestion, recent allergic reactions, rashes  PHYSICAL EXAM: Filed Vitals:   10/06/14 1350  BP: 126/72  Pulse: 85  Temp: 97.6 F (36.4 C)  Resp: 16   General: No acute distress.  Well-groomed Head:  Ecchymosis of right eye.  Bruise on right forehead. Eyes:  Fundoscopic exam unremarkable without vessel changes, exudates, hemorrhages or  papilledema. Neck: supple, no paraspinal  tenderness, full range of motion Heart:  Regular rate and rhythm Lungs:  Clear to auscultation bilaterally Back: No paraspinal tenderness Neurological Exam: alert and oriented to person, place, and time. Attention span and concentration intact, recent and remote memory intact, fund of knowledge intact.  Speech fluent and not dysarthric, language intact.   MMSE - Mini Mental State Exam 10/06/2014  Orientation to time 5  Orientation to Place 5  Registration 3  Attention/ Calculation 5  Recall 3  Language- name 2 objects 2  Language- repeat 1  Language- follow 3 step command 3  Language- read & follow direction 1  Write a sentence 1  Copy design 1  Total score 30   CN II-XII intact. Fundoscopic exam unremarkable without vessel changes, exudates, hemorrhages or papilledema.  Bulk and tone normal, muscle strength 5/5 throughout.  Sensation to light touch, temperature and vibration intact.  Deep tendon reflexes 2+ throughout, toes downgoing.  Finger to nose and heel to shin testing intact.  Gait wide-based.  Unable to tandem walk.  Romberg negative.  IMPRESSION: Amnestic mild cognitive impairment  PLAN: 1.  Increase Exelon to 3mg  twice daily 2.  Advise B12 1000mg  daily.  May also consider B1 as well. 3.  Follow up in 9 months.  Metta Clines, DO  CC: Scarlette Calico, MD

## 2014-10-06 NOTE — Patient Instructions (Addendum)
Increase rivastigmine to 3mg  twice daily Consider over the counter b12 1000mg  daily.  Also consider B1 50mg  daily Follow up in 9 months

## 2014-10-08 ENCOUNTER — Ambulatory Visit (INDEPENDENT_AMBULATORY_CARE_PROVIDER_SITE_OTHER): Payer: Medicare Other | Admitting: *Deleted

## 2014-10-08 ENCOUNTER — Encounter: Payer: Self-pay | Admitting: Internal Medicine

## 2014-10-08 ENCOUNTER — Ambulatory Visit (INDEPENDENT_AMBULATORY_CARE_PROVIDER_SITE_OTHER): Payer: Medicare Other | Admitting: Internal Medicine

## 2014-10-08 VITALS — BP 130/80 | HR 69 | Temp 97.9°F | Resp 16 | Ht 73.0 in | Wt 201.0 lb

## 2014-10-08 DIAGNOSIS — S0083XS Contusion of other part of head, sequela: Secondary | ICD-10-CM | POA: Insufficient documentation

## 2014-10-08 DIAGNOSIS — S0083XD Contusion of other part of head, subsequent encounter: Secondary | ICD-10-CM

## 2014-10-08 DIAGNOSIS — I480 Paroxysmal atrial fibrillation: Secondary | ICD-10-CM | POA: Diagnosis not present

## 2014-10-08 DIAGNOSIS — M545 Low back pain: Secondary | ICD-10-CM | POA: Diagnosis not present

## 2014-10-08 DIAGNOSIS — S61401D Unspecified open wound of right hand, subsequent encounter: Secondary | ICD-10-CM

## 2014-10-08 DIAGNOSIS — Z7901 Long term (current) use of anticoagulants: Secondary | ICD-10-CM

## 2014-10-08 DIAGNOSIS — I255 Ischemic cardiomyopathy: Secondary | ICD-10-CM

## 2014-10-08 DIAGNOSIS — S61401A Unspecified open wound of right hand, initial encounter: Secondary | ICD-10-CM | POA: Insufficient documentation

## 2014-10-08 DIAGNOSIS — I4891 Unspecified atrial fibrillation: Secondary | ICD-10-CM | POA: Diagnosis not present

## 2014-10-08 DIAGNOSIS — Z23 Encounter for immunization: Secondary | ICD-10-CM | POA: Diagnosis not present

## 2014-10-08 DIAGNOSIS — I10 Essential (primary) hypertension: Secondary | ICD-10-CM | POA: Diagnosis not present

## 2014-10-08 DIAGNOSIS — Z5181 Encounter for therapeutic drug level monitoring: Secondary | ICD-10-CM | POA: Diagnosis not present

## 2014-10-08 DIAGNOSIS — F10229 Alcohol dependence with intoxication, unspecified: Secondary | ICD-10-CM

## 2014-10-08 LAB — POCT INR: INR: 2.5

## 2014-10-08 NOTE — Patient Instructions (Signed)

## 2014-10-08 NOTE — Progress Notes (Signed)
Subjective:  Patient ID: Corey [name], male    DOB: October 06, 1935  Age: 79 y.o. MRN: [id number]  CC: Wound Check   HPI Corey [name] presents for a wound check after a recent fall. Several days ago he was drinking heavily during the evening and he fell down a flight of stairs. He injured his right hand and the right side of his face. He was seen in the emergency room and had CT scans done and was found to have no significant injuries. He returns today for recheck. He has bruising on the right side of his face but no headache, nausea, or vomiting. His wife is doing dressing changes on his right hand and he feels like the skin tear is healing well.  Outpatient Prescriptions Prior to Visit  Medication Sig Dispense Refill  . albuterol (PROVENTIL HFA;VENTOLIN HFA) 108 (90 BASE) MCG/ACT inhaler Inhale 1-2 puffs into the lungs every 6 (six) hours as needed for shortness of breath. 1 Inhaler 11  . amiodarone (PACERONE) 200 MG tablet TAKE ONE-HALF TABLET DAILY (Patient taking differently: TAKE ONE-HALF TABLET BY MOUTH DAILY) 45 tablet 1  . CRESTOR 5 MG tablet TAKE ONE TABLET IN THE EVENING (Patient taking differently: TAKE ONE TABLET BY MOUTH IN THE EVENING) 30 tablet 11  . cyanocobalamin (,VITAMIN B-12,) 1000 MCG/ML injection Inject 1000 mcg/ml weekly x 4 then monthly 4 mL 6  . folic acid (FOLVITE) 1 MG tablet Take 1 tablet (1 mg total) by mouth daily. 90 tablet 3  . metoprolol succinate (TOPROL-XL) 50 MG 24 hr tablet TAKE 1/2 TABLET AT NIGHT 30 tablet 5  . mometasone (NASONEX) 50 MCG/ACT nasal spray TWO SPRAYS IN EACH NOSTRIL DAILY AS NEEDED FOR ALLERGIES    . NONFORMULARY OR COMPOUNDED ITEM Allergy Vaccine 1:10 Given at [facility]    . pantoprazole (PROTONIX) 40 MG tablet TAKE ONE TABLET BY MOUTH ONCE DAILY 30 tablet 1  . polyethylene glycol powder (GLYCOLAX/MIRALAX) powder Take 17 g by mouth daily as needed (constipation).     . ramipril (ALTACE) 10 MG capsule TAKE TWO CAPSULES EVERY  DAY 60 capsule 6  . rivastigmine (EXELON) 3 MG capsule Take 1 capsule (3 mg total) by mouth 2 (two) times daily. 60 capsule 11  . spironolactone (ALDACTONE) 25 MG tablet TAKE ONE TABLET EACH DAY 30 tablet 5  . warfarin (COUMADIN) 2.5 MG tablet Take as directed by coumadin clinic 120 tablet 0  . clonazePAM (KLONOPIN) 0.5 MG tablet TAKE 1/2 TO 2 TABLETS AS NEEDED FOR SLEEP 30 tablet 5  . predniSONE (DELTASONE) 5 MG tablet      No facility-administered medications prior to visit.    ROS Review of Systems  Constitutional: Negative.   HENT: Negative.  Negative for trouble swallowing.   Eyes: Negative.  Negative for photophobia, redness and visual disturbance.  Respiratory: Negative.  Negative for cough, choking, chest tightness, shortness of breath and stridor.   Cardiovascular: Negative.  Negative for chest pain, palpitations and leg swelling.  Gastrointestinal: Negative.  Negative for nausea, vomiting, abdominal pain, diarrhea, constipation and blood in stool.  Endocrine: Negative.   Genitourinary: Negative.   Musculoskeletal: Negative.  Negative for back pain, joint swelling, arthralgias, neck pain and neck stiffness.  Skin: Positive for wound. Negative for color change, pallor and rash.  Allergic/Immunologic: Negative.   Neurological: Negative.  Negative for dizziness, syncope, speech difficulty, weakness, light-headedness, numbness and headaches.  Hematological: Negative.   Psychiatric/Behavioral: Negative.     Objective:  BP 130/80 mmHg  Pulse 69  Temp(Src) 97.9 F (36.6 C) (Oral)  Resp 16  Ht 6\' 1"  (1.854 m)  Wt 201 lb (91.173 kg)  BMI 26.52 kg/m2  SpO2 96%  BP Readings from Last 3 Encounters:  10/08/14 130/80  10/06/14 126/72  10/05/14 156/84    Wt Readings from Last 3 Encounters:  10/08/14 201 lb (91.173 kg)  10/06/14 200 lb 1.6 oz (90.765 kg)  10/05/14 195 lb (88.451 kg)    Physical Exam  Constitutional: He is oriented to person, place, and time. No distress.    HENT:  Head: Head is with raccoon's eyes and with contusion. Head is without Battle's sign, without abrasion and without laceration.    Right Ear: Hearing, tympanic membrane, external ear and ear canal normal. No hemotympanum.  Left Ear: Hearing, tympanic membrane, external ear and ear canal normal. No hemotympanum.  Mouth/Throat: Oropharynx is clear and moist. No oropharyngeal exudate.  Eyes: Conjunctivae are normal. Right eye exhibits no discharge. Left eye exhibits no discharge. No scleral icterus.  Neck: Normal range of motion. Neck supple. No JVD present. No tracheal deviation present. No thyromegaly present.  Cardiovascular: Normal rate, regular rhythm, normal heart sounds and intact distal pulses.  Exam reveals no gallop and no friction rub.   No murmur heard. Pulmonary/Chest: Effort normal and breath sounds normal. No stridor. No respiratory distress. He has no wheezes. He has no rales. He exhibits no tenderness.  Abdominal: Soft. Bowel sounds are normal. He exhibits no distension and no mass. There is no tenderness. There is no rebound and no guarding.  Musculoskeletal: Normal range of motion. He exhibits no edema or tenderness.       Cervical back: Normal. He exhibits normal range of motion, no tenderness, no bony tenderness, no swelling, no edema, no deformity, no laceration, no pain, no spasm and normal pulse.       Right hand: He exhibits deformity. He exhibits normal range of motion, no tenderness, no bony tenderness, normal two-point discrimination, normal capillary refill, no laceration and no swelling. Normal sensation noted. Normal strength noted.       Hands: There is no necrotic tissue in the wound  Lymphadenopathy:    He has no cervical adenopathy.  Neurological: He is alert and oriented to person, place, and time. He has normal reflexes. He displays normal reflexes. No cranial nerve deficit. He exhibits normal muscle tone. Coordination normal.  Skin: Skin is warm and dry.  No rash noted. He is not diaphoretic. No erythema. No pallor.  Psychiatric: He has a normal mood and affect. His behavior is normal. Judgment and thought content normal.  Vitals reviewed.   Lab Results  Component Value Date   WBC 6.3 10/05/2014   HGB 12.2* 10/05/2014   HCT 36.0* 10/05/2014   PLT 176 10/05/2014   GLUCOSE 86 10/05/2014   CHOL 161 11/13/2013   TRIG 129.0 11/13/2013   HDL 55.50 11/13/2013   LDLCALC 80 11/13/2013   ALT 29 10/05/2014   AST 31 10/05/2014   NA 143 10/05/2014   K 3.8 10/05/2014   CL 106 10/05/2014   CREATININE 1.30* 10/05/2014   BUN 27* 10/05/2014   CO2 24 10/05/2014   TSH 2.76 05/01/2014   PSA 0.02* 11/13/2013   INR 2.40* 10/05/2014   HGBA1C 6.0 11/13/2013    Dg Wrist Complete Right  10/05/2014   CLINICAL DATA:  Fall.  EXAM: RIGHT WRIST - COMPLETE 3+ VIEW  COMPARISON:  03/30/2010  FINDINGS: There is no evidence of fracture or  dislocation. There is no evidence of arthropathy or other focal bone abnormality. Soft tissues are unremarkable.  IMPRESSION: Negative.   Electronically Signed   By: Kerby Moors M.D.   On: 10/05/2014 16:04   Ct Head Wo Contrast  10/05/2014   CLINICAL DATA:  Fall.  Head trauma.  Fall down stairs.  EXAM: CT HEAD WITHOUT CONTRAST  CT MAXILLOFACIAL WITHOUT CONTRAST  CT CERVICAL SPINE WITHOUT CONTRAST  TECHNIQUE: Multidetector CT imaging of the head, cervical spine, and maxillofacial structures were performed using the standard protocol without intravenous contrast. Multiplanar CT image reconstructions of the cervical spine and maxillofacial structures were also generated.  COMPARISON:  01/28/2014.  FINDINGS: CT HEAD FINDINGS  No mass lesion, mass effect, midline shift, hydrocephalus, hemorrhage. No acute territorial cortical ischemia/infarct. Atrophy and chronic ischemic white matter disease is present. Chronic LEFT MCA infarct. Mastoid air cells and middle ears appear clear. Calvarium intact. Intracranial atherosclerosis.  CT  MAXILLOFACIAL FINDINGS  Globes: Intact  Bony orbits:  Intact  Pterygoid plates: Intact  Mandibular condyles:  Located  Mandible: Intact.  Torus mandibularis is present.  Intracranial contents:  See above.  Paranasal sinuses: Mucosal thickening is present in the posterior RIGHT maxillary sinus. LEFT maxillary sinus appears normal. Frontal sinuses are within normal limits.  Mastoid air cells: Opacification of a few inferior RIGHT mastoid air cells. LEFT mastoid air cells clear.  Nasal bones: Intact  Soft tissues: Normal  Visible cervical spine: See below.  CT CERVICAL SPINE FINDINGS  Alignment: Straightening of the normal cervical lordosis. 2 mm anterolisthesis of C7 on T1 appears degenerative and facet mediated. Dextroconvex torticollis.  Craniocervical junction: The odontoid is intact. Occipital condyles intact. Normal C1 ring.  Vertebrae: Negative for fracture or aggressive osseous lesions.  Paraspinal soft tissues: Carotid atherosclerosis. 4 mm low-density lesion in the LEFT thyroid lobe does not merit further evaluation.  Lung apices: Grossly normal.  Severe cervical spine degenerative disease is present. This involves both discs and facets. There is ankylosis at C3-C4 with ossification of the disc. The fusion is solid. Bilateral severe facet arthrosis is present with multilevel foraminal stenosis due to a combination of uncovertebral spurring and facet spurring. Disc osteophyte complexes are most pronounced at C5-C6 and C6-C7 producing mild central stenosis.  IMPRESSION: 1. No acute intracranial abnormality. Atrophy, chronic ischemic white matter disease and old LEFT MCA infarct. 2. Negative for facial fracture. 3. Moderate to severe cervical spine degenerative disease. No cervical spine fracture or dislocation.   Electronically Signed   By: Dereck Ligas M.D.   On: 10/05/2014 16:11   Ct Chest W Contrast  10/05/2014   CLINICAL DATA:  Fall down 15 steps last evening. Back pain. Prostate cancer. Dementia.  Initial encounter.  EXAM: CT CHEST, ABDOMEN, AND PELVIS WITH CONTRAST  TECHNIQUE: Multidetector CT imaging of the chest, abdomen and pelvis was performed following the standard protocol during bolus administration of intravenous contrast.  CONTRAST:  141mL OMNIPAQUE IOHEXOL 300 MG/ML  SOLN  COMPARISON:  One-view chest x-ray from the same day. CTA chest 03/30/2010. CT abdomen and pelvis 12/26/2013.  FINDINGS: CT CHEST FINDINGS  The heart size is normal. Pacing wires are in place. Coronary artery calcifications are present. Atherosclerotic calcifications are present at the aortic arch. Calcified lymph nodes are present in the left hilum. No other significant mediastinal or axillary nodes are present.  A subcentimeter left thyroid nodule is present. The thoracic inlet is otherwise within normal limits. The esophagus is unremarkable.  Mild dependent atelectasis is present bilaterally.  Calcified nodules are noted in the lingula. The combination suggests a history of granulomatous disease. No active disease is present.  Mild degenerative changes are present in the thoracic spine. No acute fracture or traumatic subluxation is evident. The ribs are intact.  CT ABDOMEN AND PELVIS FINDINGS  The liver is within normal limits. Calcifications in the spleen are compatible with prior granulomatous disease.  The stomach, duodenum, and pancreas are unremarkable. The common bile duct and gallbladder are normal. The adrenal glands are normal bilaterally. A 7 mm cyst is noted near the upper pole of the right kidney. Kidneys are otherwise within normal limits bilaterally.  Extensive diverticular changes are present throughout the sigmoid colon. There is no focal inflammation suggest diverticulitis. The more proximal colon is unremarkable. The appendix is visualized and within normal limits. Small bowel is normal. No significant adenopathy or free fluid is present. Fat is herniated into the left inguinal canal without associated bowel.  Atherosclerotic changes are noted in the abdominal aorta without aneurysm.  Degenerative changes are noted in the lower lumbar spine. A vacuum disc is present at each level from L2-3 through L5-S1. There is chronic loss of disc height and endplate osteophytes at L5-S1. No acute fracture is evident.  IMPRESSION: 1. No evidence for acute trauma. 2. Degenerative changes throughout the thoracolumbar spine. 3. Evidence of prior granulomatous disease with calcified left hilar lymph nodes, granulomata in the lingula, and calcifications in the spleen. 4. Extensive sigmoid colonic diverticulosis without evidence for diverticulitis.   Electronically Signed   By: San Morelle M.D.   On: 10/05/2014 16:11   Ct Cervical Spine Wo Contrast  10/05/2014   CLINICAL DATA:  Fall.  Head trauma.  Fall down stairs.  EXAM: CT HEAD WITHOUT CONTRAST  CT MAXILLOFACIAL WITHOUT CONTRAST  CT CERVICAL SPINE WITHOUT CONTRAST  TECHNIQUE: Multidetector CT imaging of the head, cervical spine, and maxillofacial structures were performed using the standard protocol without intravenous contrast. Multiplanar CT image reconstructions of the cervical spine and maxillofacial structures were also generated.  COMPARISON:  01/28/2014.  FINDINGS: CT HEAD FINDINGS  No mass lesion, mass effect, midline shift, hydrocephalus, hemorrhage. No acute territorial cortical ischemia/infarct. Atrophy and chronic ischemic white matter disease is present. Chronic LEFT MCA infarct. Mastoid air cells and middle ears appear clear. Calvarium intact. Intracranial atherosclerosis.  CT MAXILLOFACIAL FINDINGS  Globes: Intact  Bony orbits:  Intact  Pterygoid plates: Intact  Mandibular condyles:  Located  Mandible: Intact.  Torus mandibularis is present.  Intracranial contents:  See above.  Paranasal sinuses: Mucosal thickening is present in the posterior RIGHT maxillary sinus. LEFT maxillary sinus appears normal. Frontal sinuses are within normal limits.  Mastoid air cells:  Opacification of a few inferior RIGHT mastoid air cells. LEFT mastoid air cells clear.  Nasal bones: Intact  Soft tissues: Normal  Visible cervical spine: See below.  CT CERVICAL SPINE FINDINGS  Alignment: Straightening of the normal cervical lordosis. 2 mm anterolisthesis of C7 on T1 appears degenerative and facet mediated. Dextroconvex torticollis.  Craniocervical junction: The odontoid is intact. Occipital condyles intact. Normal C1 ring.  Vertebrae: Negative for fracture or aggressive osseous lesions.  Paraspinal soft tissues: Carotid atherosclerosis. 4 mm low-density lesion in the LEFT thyroid lobe does not merit further evaluation.  Lung apices: Grossly normal.  Severe cervical spine degenerative disease is present. This involves both discs and facets. There is ankylosis at C3-C4 with ossification of the disc. The fusion is solid. Bilateral severe facet arthrosis is present with multilevel foraminal  stenosis due to a combination of uncovertebral spurring and facet spurring. Disc osteophyte complexes are most pronounced at C5-C6 and C6-C7 producing mild central stenosis.  IMPRESSION: 1. No acute intracranial abnormality. Atrophy, chronic ischemic white matter disease and old LEFT MCA infarct. 2. Negative for facial fracture. 3. Moderate to severe cervical spine degenerative disease. No cervical spine fracture or dislocation.   Electronically Signed   By: Dereck Ligas M.D.   On: 10/05/2014 16:11   Ct Abdomen Pelvis W Contrast  10/05/2014   CLINICAL DATA:  Fall down 15 steps last evening. Back pain. Prostate cancer. Dementia. Initial encounter.  EXAM: CT CHEST, ABDOMEN, AND PELVIS WITH CONTRAST  TECHNIQUE: Multidetector CT imaging of the chest, abdomen and pelvis was performed following the standard protocol during bolus administration of intravenous contrast.  CONTRAST:  127mL OMNIPAQUE IOHEXOL 300 MG/ML  SOLN  COMPARISON:  One-view chest x-ray from the same day. CTA chest 03/30/2010. CT abdomen and pelvis  12/26/2013.  FINDINGS: CT CHEST FINDINGS  The heart size is normal. Pacing wires are in place. Coronary artery calcifications are present. Atherosclerotic calcifications are present at the aortic arch. Calcified lymph nodes are present in the left hilum. No other significant mediastinal or axillary nodes are present.  A subcentimeter left thyroid nodule is present. The thoracic inlet is otherwise within normal limits. The esophagus is unremarkable.  Mild dependent atelectasis is present bilaterally. Calcified nodules are noted in the lingula. The combination suggests a history of granulomatous disease. No active disease is present.  Mild degenerative changes are present in the thoracic spine. No acute fracture or traumatic subluxation is evident. The ribs are intact.  CT ABDOMEN AND PELVIS FINDINGS  The liver is within normal limits. Calcifications in the spleen are compatible with prior granulomatous disease.  The stomach, duodenum, and pancreas are unremarkable. The common bile duct and gallbladder are normal. The adrenal glands are normal bilaterally. A 7 mm cyst is noted near the upper pole of the right kidney. Kidneys are otherwise within normal limits bilaterally.  Extensive diverticular changes are present throughout the sigmoid colon. There is no focal inflammation suggest diverticulitis. The more proximal colon is unremarkable. The appendix is visualized and within normal limits. Small bowel is normal. No significant adenopathy or free fluid is present. Fat is herniated into the left inguinal canal without associated bowel. Atherosclerotic changes are noted in the abdominal aorta without aneurysm.  Degenerative changes are noted in the lower lumbar spine. A vacuum disc is present at each level from L2-3 through L5-S1. There is chronic loss of disc height and endplate osteophytes at L5-S1. No acute fracture is evident.  IMPRESSION: 1. No evidence for acute trauma. 2. Degenerative changes throughout the  thoracolumbar spine. 3. Evidence of prior granulomatous disease with calcified left hilar lymph nodes, granulomata in the lingula, and calcifications in the spleen. 4. Extensive sigmoid colonic diverticulosis without evidence for diverticulitis.   Electronically Signed   By: San Morelle M.D.   On: 10/05/2014 16:11   Dg Pelvis Portable  10/05/2014   CLINICAL DATA:  Fall.  EXAM: PORTABLE PELVIS 1-2 VIEWS  COMPARISON:  None  FINDINGS: There is mild bilateral hip osteoarthritis. There is no evidence for acute fracture or subluxation. Degenerative disc disease noted within the lumbar spine.  IMPRESSION: 1. No acute findings. 2. 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   Electronically Signed   By: Kerby Moors M.D.   On: 10/05/2014 15:05   Dg Chest Port 1 View  10/05/2014  CLINICAL DATA:  Fall.  EXAM: PORTABLE CHEST - 1 VIEW  COMPARISON:  05/21/2014  FINDINGS: Left-sided 3 lead pacemaker remains in place. Cardiac silhouette is likely within normal limits for AP technique. There is slight elevation of the right hemidiaphragm. The lungs are clear. No pleural effusion or pneumothorax is seen. No acute osseous abnormality is identified.  IMPRESSION: No active disease.   Electronically Signed   By: Logan Bores M.D.   On: 10/05/2014 15:03   Dg Knee Complete 4 Views Left  10/05/2014   CLINICAL DATA:  Knee pain  EXAM: LEFT KNEE - COMPLETE 4+ VIEW  COMPARISON:  06/04/2012  FINDINGS: Total knee arthroplasty reidentified. No fracture line visualized. Vascular calcifications. No evidence for dislocation. Trace suprapatellar fluid.  IMPRESSION: Expected appearance after left total knee arthroplasty without focal acute finding.   Electronically Signed   By: Conchita Paris M.D.   On: 10/05/2014 16:01   Dg Hand Complete Right  10/05/2014   CLINICAL DATA:  Entering teen alcohol last night and fell down steps. Bruising and hematoma to right forehead and night.  EXAM: RIGHT HAND - COMPLETE 3+  VIEW  COMPARISON:  None.  FINDINGS: There is no evidence of fracture or dislocation. There is no evidence of arthropathy or other focal bone abnormality. Soft tissues are unremarkable.  IMPRESSION: Negative.   Electronically Signed   By: Kerby Moors M.D.   On: 10/05/2014 16:03   Ct Maxillofacial Wo Cm  10/05/2014   CLINICAL DATA:  Fall.  Head trauma.  Fall down stairs.  EXAM: CT HEAD WITHOUT CONTRAST  CT MAXILLOFACIAL WITHOUT CONTRAST  CT CERVICAL SPINE WITHOUT CONTRAST  TECHNIQUE: Multidetector CT imaging of the head, cervical spine, and maxillofacial structures were performed using the standard protocol without intravenous contrast. Multiplanar CT image reconstructions of the cervical spine and maxillofacial structures were also generated.  COMPARISON:  01/28/2014.  FINDINGS: CT HEAD FINDINGS  No mass lesion, mass effect, midline shift, hydrocephalus, hemorrhage. No acute territorial cortical ischemia/infarct. Atrophy and chronic ischemic white matter disease is present. Chronic LEFT MCA infarct. Mastoid air cells and middle ears appear clear. Calvarium intact. Intracranial atherosclerosis.  CT MAXILLOFACIAL FINDINGS  Globes: Intact  Bony orbits:  Intact  Pterygoid plates: Intact  Mandibular condyles:  Located  Mandible: Intact.  Torus mandibularis is present.  Intracranial contents:  See above.  Paranasal sinuses: Mucosal thickening is present in the posterior RIGHT maxillary sinus. LEFT maxillary sinus appears normal. Frontal sinuses are within normal limits.  Mastoid air cells: Opacification of a few inferior RIGHT mastoid air cells. LEFT mastoid air cells clear.  Nasal bones: Intact  Soft tissues: Normal  Visible cervical spine: See below.  CT CERVICAL SPINE FINDINGS  Alignment: Straightening of the normal cervical lordosis. 2 mm anterolisthesis of C7 on T1 appears degenerative and facet mediated. Dextroconvex torticollis.  Craniocervical junction: The odontoid is intact. Occipital condyles intact.  Normal C1 ring.  Vertebrae: Negative for fracture or aggressive osseous lesions.  Paraspinal soft tissues: Carotid atherosclerosis. 4 mm low-density lesion in the LEFT thyroid lobe does not merit further evaluation.  Lung apices: Grossly normal.  Severe cervical spine degenerative disease is present. This involves both discs and facets. There is ankylosis at C3-C4 with ossification of the disc. The fusion is solid. Bilateral severe facet arthrosis is present with multilevel foraminal stenosis due to a combination of uncovertebral spurring and facet spurring. Disc osteophyte complexes are most pronounced at C5-C6 and C6-C7 producing mild central stenosis.  IMPRESSION: 1. No acute  intracranial abnormality. Atrophy, chronic ischemic white matter disease and old LEFT MCA infarct. 2. Negative for facial fracture. 3. Moderate to severe cervical spine degenerative disease. No cervical spine fracture or dislocation.   Electronically Signed   By: Dereck Ligas M.D.   On: 10/05/2014 16:11    Assessment & Plan:   Corey [name] was seen today for wound check.  Diagnoses and all orders for this visit:  Essential hypertension- his blood pressures well controlled.  Facial contusion, subsequent encounter- this appears to be healing nicely with no evidence of head injury.  Alcohol dependence with intoxication with complication- he fell because he had consumed about 10 liquor drinks. He has also been taking benzos with his alcohol intake. He agrees at this time to limit his alcohol intake to 2 whiskey drinks per day and he will stop taking the benzodiazepines.  Open wound of right hand, subsequent encounter- the skin tear on the right hand appears to be healing well. I cleaned the area, applied triple antibiotic ointment, apply Telfa and a dressing.   I have discontinued Mr. Noguez clonazePAM and predniSONE. I am also having him maintain his mometasone, polyethylene glycol powder, folic acid, CRESTOR, albuterol,  amiodarone, ramipril, spironolactone, cyanocobalamin, warfarin, pantoprazole, metoprolol succinate, NONFORMULARY OR COMPOUNDED ITEM, and rivastigmine.  No orders of the defined types were placed in this encounter.     Follow-up: Return in about 1 week (around 10/15/2014).  Scarlette Calico, MD

## 2014-10-08 NOTE — Progress Notes (Signed)
Pre visit review using our clinic review tool, if applicable. No additional management support is needed unless otherwise documented below in the visit note. 

## 2014-10-09 ENCOUNTER — Ambulatory Visit (INDEPENDENT_AMBULATORY_CARE_PROVIDER_SITE_OTHER): Payer: Medicare Other

## 2014-10-09 DIAGNOSIS — J309 Allergic rhinitis, unspecified: Secondary | ICD-10-CM

## 2014-10-09 NOTE — Addendum Note (Signed)
Addended by: Lowella Dandy on: 10/09/2014 10:16 AM   Modules accepted: Orders

## 2014-10-13 DIAGNOSIS — M545 Low back pain: Secondary | ICD-10-CM | POA: Diagnosis not present

## 2014-10-15 ENCOUNTER — Ambulatory Visit (INDEPENDENT_AMBULATORY_CARE_PROVIDER_SITE_OTHER): Payer: Medicare Other | Admitting: Internal Medicine

## 2014-10-15 ENCOUNTER — Encounter: Payer: Self-pay | Admitting: Internal Medicine

## 2014-10-15 VITALS — BP 132/88 | HR 66 | Temp 97.1°F | Resp 16 | Ht 73.0 in | Wt 201.0 lb

## 2014-10-15 DIAGNOSIS — I255 Ischemic cardiomyopathy: Secondary | ICD-10-CM

## 2014-10-15 DIAGNOSIS — S61401D Unspecified open wound of right hand, subsequent encounter: Secondary | ICD-10-CM | POA: Diagnosis not present

## 2014-10-15 NOTE — Patient Instructions (Signed)

## 2014-10-15 NOTE — Progress Notes (Signed)
Subjective:  Patient ID: Corey [name], male    DOB: 11-Jan-1936  Age: 79 y.o. MRN: [id number]  CC: Wound Check   HPI Corey [name] presents for recheck wound (skin tear) on the dorsum of the right hand, it is healing well with no complications. There is no pain, swelling, redness, drainage.  Outpatient Prescriptions Prior to Visit  Medication Sig Dispense Refill  . albuterol (PROVENTIL HFA;VENTOLIN HFA) 108 (90 BASE) MCG/ACT inhaler Inhale 1-2 puffs into the lungs every 6 (six) hours as needed for shortness of breath. 1 Inhaler 11  . amiodarone (PACERONE) 200 MG tablet TAKE ONE-HALF TABLET DAILY (Patient taking differently: TAKE ONE-HALF TABLET BY MOUTH DAILY) 45 tablet 1  . CRESTOR 5 MG tablet TAKE ONE TABLET IN THE EVENING (Patient taking differently: TAKE ONE TABLET BY MOUTH IN THE EVENING) 30 tablet 11  . cyanocobalamin (,VITAMIN B-12,) 1000 MCG/ML injection Inject 1000 mcg/ml weekly x 4 then monthly 4 mL 6  . folic acid (FOLVITE) 1 MG tablet Take 1 tablet (1 mg total) by mouth daily. 90 tablet 3  . metoprolol succinate (TOPROL-XL) 50 MG 24 hr tablet TAKE 1/2 TABLET AT NIGHT 30 tablet 5  . mometasone (NASONEX) 50 MCG/ACT nasal spray TWO SPRAYS IN EACH NOSTRIL DAILY AS NEEDED FOR ALLERGIES    . NONFORMULARY OR COMPOUNDED ITEM Allergy Vaccine 1:10 Given at [facility]    . pantoprazole (PROTONIX) 40 MG tablet TAKE ONE TABLET BY MOUTH ONCE DAILY 30 tablet 1  . polyethylene glycol powder (GLYCOLAX/MIRALAX) powder Take 17 g by mouth daily as needed (constipation).     . ramipril (ALTACE) 10 MG capsule TAKE TWO CAPSULES EVERY DAY 60 capsule 6  . rivastigmine (EXELON) 3 MG capsule Take 1 capsule (3 mg total) by mouth 2 (two) times daily. 60 capsule 11  . spironolactone (ALDACTONE) 25 MG tablet TAKE ONE TABLET EACH DAY 30 tablet 5  . warfarin (COUMADIN) 2.5 MG tablet Take as directed by coumadin clinic 120 tablet 0   No facility-administered medications prior to visit.     ROS Review of Systems  All other systems reviewed and are negative.   Objective:  BP 132/88 mmHg  Pulse 66  Temp(Src) 97.1 F (36.2 C) (Oral)  Ht 6\' 1"  (1.854 m)  Wt 201 lb (91.173 kg)  BMI 26.52 kg/m2  SpO2 93%  BP Readings from Last 3 Encounters:  10/15/14 132/88  10/08/14 130/80  10/06/14 126/72    Wt Readings from Last 3 Encounters:  10/15/14 201 lb (91.173 kg)  10/08/14 201 lb (91.173 kg)  10/06/14 200 lb 1.6 oz (90.765 kg)    Physical Exam  Musculoskeletal:       Hands:   Lab Results  Component Value Date   WBC 6.3 10/05/2014   HGB 12.2* 10/05/2014   HCT 36.0* 10/05/2014   PLT 176 10/05/2014   GLUCOSE 86 10/05/2014   CHOL 161 11/13/2013   TRIG 129.0 11/13/2013   HDL 55.50 11/13/2013   LDLCALC 80 11/13/2013   ALT 29 10/05/2014   AST 31 10/05/2014   NA 143 10/05/2014   K 3.8 10/05/2014   CL 106 10/05/2014   CREATININE 1.30* 10/05/2014   BUN 27* 10/05/2014   CO2 24 10/05/2014   TSH 2.76 05/01/2014   PSA 0.02* 11/13/2013   INR 2.5 10/08/2014   HGBA1C 6.0 11/13/2013    Dg Wrist Complete Right  10/05/2014   CLINICAL DATA:  Fall.  EXAM: RIGHT WRIST - COMPLETE 3+ VIEW  COMPARISON:  03/30/2010  FINDINGS: There is no evidence of fracture or dislocation. There is no evidence of arthropathy or other focal bone abnormality. Soft tissues are unremarkable.  IMPRESSION: Negative.   Electronically Signed   By: Kerby Moors M.D.   On: 10/05/2014 16:04   Ct Head Wo Contrast  10/05/2014   CLINICAL DATA:  Fall.  Head trauma.  Fall down stairs.  EXAM: CT HEAD WITHOUT CONTRAST  CT MAXILLOFACIAL WITHOUT CONTRAST  CT CERVICAL SPINE WITHOUT CONTRAST  TECHNIQUE: Multidetector CT imaging of the head, cervical spine, and maxillofacial structures were performed using the standard protocol without intravenous contrast. Multiplanar CT image reconstructions of the cervical spine and maxillofacial structures were also generated.  COMPARISON:  01/28/2014.  FINDINGS: CT HEAD  FINDINGS  No mass lesion, mass effect, midline shift, hydrocephalus, hemorrhage. No acute territorial cortical ischemia/infarct. Atrophy and chronic ischemic white matter disease is present. Chronic LEFT MCA infarct. Mastoid air cells and middle ears appear clear. Calvarium intact. Intracranial atherosclerosis.  CT MAXILLOFACIAL FINDINGS  Globes: Intact  Bony orbits:  Intact  Pterygoid plates: Intact  Mandibular condyles:  Located  Mandible: Intact.  Torus mandibularis is present.  Intracranial contents:  See above.  Paranasal sinuses: Mucosal thickening is present in the posterior RIGHT maxillary sinus. LEFT maxillary sinus appears normal. Frontal sinuses are within normal limits.  Mastoid air cells: Opacification of a few inferior RIGHT mastoid air cells. LEFT mastoid air cells clear.  Nasal bones: Intact  Soft tissues: Normal  Visible cervical spine: See below.  CT CERVICAL SPINE FINDINGS  Alignment: Straightening of the normal cervical lordosis. 2 mm anterolisthesis of C7 on T1 appears degenerative and facet mediated. Dextroconvex torticollis.  Craniocervical junction: The odontoid is intact. Occipital condyles intact. Normal C1 ring.  Vertebrae: Negative for fracture or aggressive osseous lesions.  Paraspinal soft tissues: Carotid atherosclerosis. 4 mm low-density lesion in the LEFT thyroid lobe does not merit further evaluation.  Lung apices: Grossly normal.  Severe cervical spine degenerative disease is present. This involves both discs and facets. There is ankylosis at C3-C4 with ossification of the disc. The fusion is solid. Bilateral severe facet arthrosis is present with multilevel foraminal stenosis due to a combination of uncovertebral spurring and facet spurring. Disc osteophyte complexes are most pronounced at C5-C6 and C6-C7 producing mild central stenosis.  IMPRESSION: 1. No acute intracranial abnormality. Atrophy, chronic ischemic white matter disease and old LEFT MCA infarct. 2. Negative for  facial fracture. 3. Moderate to severe cervical spine degenerative disease. No cervical spine fracture or dislocation.   Electronically Signed   By: Dereck Ligas M.D.   On: 10/05/2014 16:11   Ct Chest W Contrast  10/05/2014   CLINICAL DATA:  Fall down 15 steps last evening. Back pain. Prostate cancer. Dementia. Initial encounter.  EXAM: CT CHEST, ABDOMEN, AND PELVIS WITH CONTRAST  TECHNIQUE: Multidetector CT imaging of the chest, abdomen and pelvis was performed following the standard protocol during bolus administration of intravenous contrast.  CONTRAST:  12mL OMNIPAQUE IOHEXOL 300 MG/ML  SOLN  COMPARISON:  One-view chest x-ray from the same day. CTA chest 03/30/2010. CT abdomen and pelvis 12/26/2013.  FINDINGS: CT CHEST FINDINGS  The heart size is normal. Pacing wires are in place. Coronary artery calcifications are present. Atherosclerotic calcifications are present at the aortic arch. Calcified lymph nodes are present in the left hilum. No other significant mediastinal or axillary nodes are present.  A subcentimeter left thyroid nodule is present. The thoracic inlet is otherwise within normal limits. The  esophagus is unremarkable.  Mild dependent atelectasis is present bilaterally. Calcified nodules are noted in the lingula. The combination suggests a history of granulomatous disease. No active disease is present.  Mild degenerative changes are present in the thoracic spine. No acute fracture or traumatic subluxation is evident. The ribs are intact.  CT ABDOMEN AND PELVIS FINDINGS  The liver is within normal limits. Calcifications in the spleen are compatible with prior granulomatous disease.  The stomach, duodenum, and pancreas are unremarkable. The common bile duct and gallbladder are normal. The adrenal glands are normal bilaterally. A 7 mm cyst is noted near the upper pole of the right kidney. Kidneys are otherwise within normal limits bilaterally.  Extensive diverticular changes are present  throughout the sigmoid colon. There is no focal inflammation suggest diverticulitis. The more proximal colon is unremarkable. The appendix is visualized and within normal limits. Small bowel is normal. No significant adenopathy or free fluid is present. Fat is herniated into the left inguinal canal without associated bowel. Atherosclerotic changes are noted in the abdominal aorta without aneurysm.  Degenerative changes are noted in the lower lumbar spine. A vacuum disc is present at each level from L2-3 through L5-S1. There is chronic loss of disc height and endplate osteophytes at L5-S1. No acute fracture is evident.  IMPRESSION: 1. No evidence for acute trauma. 2. Degenerative changes throughout the thoracolumbar spine. 3. Evidence of prior granulomatous disease with calcified left hilar lymph nodes, granulomata in the lingula, and calcifications in the spleen. 4. Extensive sigmoid colonic diverticulosis without evidence for diverticulitis.   Electronically Signed   By: San Morelle M.D.   On: 10/05/2014 16:11   Ct Cervical Spine Wo Contrast  10/05/2014   CLINICAL DATA:  Fall.  Head trauma.  Fall down stairs.  EXAM: CT HEAD WITHOUT CONTRAST  CT MAXILLOFACIAL WITHOUT CONTRAST  CT CERVICAL SPINE WITHOUT CONTRAST  TECHNIQUE: Multidetector CT imaging of the head, cervical spine, and maxillofacial structures were performed using the standard protocol without intravenous contrast. Multiplanar CT image reconstructions of the cervical spine and maxillofacial structures were also generated.  COMPARISON:  01/28/2014.  FINDINGS: CT HEAD FINDINGS  No mass lesion, mass effect, midline shift, hydrocephalus, hemorrhage. No acute territorial cortical ischemia/infarct. Atrophy and chronic ischemic white matter disease is present. Chronic LEFT MCA infarct. Mastoid air cells and middle ears appear clear. Calvarium intact. Intracranial atherosclerosis.  CT MAXILLOFACIAL FINDINGS  Globes: Intact  Bony orbits:  Intact   Pterygoid plates: Intact  Mandibular condyles:  Located  Mandible: Intact.  Torus mandibularis is present.  Intracranial contents:  See above.  Paranasal sinuses: Mucosal thickening is present in the posterior RIGHT maxillary sinus. LEFT maxillary sinus appears normal. Frontal sinuses are within normal limits.  Mastoid air cells: Opacification of a few inferior RIGHT mastoid air cells. LEFT mastoid air cells clear.  Nasal bones: Intact  Soft tissues: Normal  Visible cervical spine: See below.  CT CERVICAL SPINE FINDINGS  Alignment: Straightening of the normal cervical lordosis. 2 mm anterolisthesis of C7 on T1 appears degenerative and facet mediated. Dextroconvex torticollis.  Craniocervical junction: The odontoid is intact. Occipital condyles intact. Normal C1 ring.  Vertebrae: Negative for fracture or aggressive osseous lesions.  Paraspinal soft tissues: Carotid atherosclerosis. 4 mm low-density lesion in the LEFT thyroid lobe does not merit further evaluation.  Lung apices: Grossly normal.  Severe cervical spine degenerative disease is present. This involves both discs and facets. There is ankylosis at C3-C4 with ossification of the disc. The fusion is  solid. Bilateral severe facet arthrosis is present with multilevel foraminal stenosis due to a combination of uncovertebral spurring and facet spurring. Disc osteophyte complexes are most pronounced at C5-C6 and C6-C7 producing mild central stenosis.  IMPRESSION: 1. No acute intracranial abnormality. Atrophy, chronic ischemic white matter disease and old LEFT MCA infarct. 2. Negative for facial fracture. 3. Moderate to severe cervical spine degenerative disease. No cervical spine fracture or dislocation.   Electronically Signed   By: Dereck Ligas M.D.   On: 10/05/2014 16:11   Ct Abdomen Pelvis W Contrast  10/05/2014   CLINICAL DATA:  Fall down 15 steps last evening. Back pain. Prostate cancer. Dementia. Initial encounter.  EXAM: CT CHEST, ABDOMEN, AND PELVIS  WITH CONTRAST  TECHNIQUE: Multidetector CT imaging of the chest, abdomen and pelvis was performed following the standard protocol during bolus administration of intravenous contrast.  CONTRAST:  160mL OMNIPAQUE IOHEXOL 300 MG/ML  SOLN  COMPARISON:  One-view chest x-ray from the same day. CTA chest 03/30/2010. CT abdomen and pelvis 12/26/2013.  FINDINGS: CT CHEST FINDINGS  The heart size is normal. Pacing wires are in place. Coronary artery calcifications are present. Atherosclerotic calcifications are present at the aortic arch. Calcified lymph nodes are present in the left hilum. No other significant mediastinal or axillary nodes are present.  A subcentimeter left thyroid nodule is present. The thoracic inlet is otherwise within normal limits. The esophagus is unremarkable.  Mild dependent atelectasis is present bilaterally. Calcified nodules are noted in the lingula. The combination suggests a history of granulomatous disease. No active disease is present.  Mild degenerative changes are present in the thoracic spine. No acute fracture or traumatic subluxation is evident. The ribs are intact.  CT ABDOMEN AND PELVIS FINDINGS  The liver is within normal limits. Calcifications in the spleen are compatible with prior granulomatous disease.  The stomach, duodenum, and pancreas are unremarkable. The common bile duct and gallbladder are normal. The adrenal glands are normal bilaterally. A 7 mm cyst is noted near the upper pole of the right kidney. Kidneys are otherwise within normal limits bilaterally.  Extensive diverticular changes are present throughout the sigmoid colon. There is no focal inflammation suggest diverticulitis. The more proximal colon is unremarkable. The appendix is visualized and within normal limits. Small bowel is normal. No significant adenopathy or free fluid is present. Fat is herniated into the left inguinal canal without associated bowel. Atherosclerotic changes are noted in the abdominal aorta  without aneurysm.  Degenerative changes are noted in the lower lumbar spine. A vacuum disc is present at each level from L2-3 through L5-S1. There is chronic loss of disc height and endplate osteophytes at L5-S1. No acute fracture is evident.  IMPRESSION: 1. No evidence for acute trauma. 2. Degenerative changes throughout the thoracolumbar spine. 3. Evidence of prior granulomatous disease with calcified left hilar lymph nodes, granulomata in the lingula, and calcifications in the spleen. 4. Extensive sigmoid colonic diverticulosis without evidence for diverticulitis.   Electronically Signed   By: San Morelle M.D.   On: 10/05/2014 16:11   Dg Pelvis Portable  10/05/2014   CLINICAL DATA:  Fall.  EXAM: PORTABLE PELVIS 1-2 VIEWS  COMPARISON:  None  FINDINGS: There is mild bilateral hip osteoarthritis. There is no evidence for acute fracture or subluxation. Degenerative disc disease noted within the lumbar spine.  IMPRESSION: 1. No acute findings. 2. 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   Electronically Signed   By: Kerby Moors M.D.   On: 10/05/2014  15:05   Dg Chest Port 1 View  10/05/2014   CLINICAL DATA:  Fall.  EXAM: PORTABLE CHEST - 1 VIEW  COMPARISON:  05/21/2014  FINDINGS: Left-sided 3 lead pacemaker remains in place. Cardiac silhouette is likely within normal limits for AP technique. There is slight elevation of the right hemidiaphragm. The lungs are clear. No pleural effusion or pneumothorax is seen. No acute osseous abnormality is identified.  IMPRESSION: No active disease.   Electronically Signed   By: Logan Bores M.D.   On: 10/05/2014 15:03   Dg Knee Complete 4 Views Left  10/05/2014   CLINICAL DATA:  Knee pain  EXAM: LEFT KNEE - COMPLETE 4+ VIEW  COMPARISON:  06/04/2012  FINDINGS: Total knee arthroplasty reidentified. No fracture line visualized. Vascular calcifications. No evidence for dislocation. Trace suprapatellar fluid.  IMPRESSION: Expected appearance after  left total knee arthroplasty without focal acute finding.   Electronically Signed   By: Conchita Paris M.D.   On: 10/05/2014 16:01   Dg Hand Complete Right  10/05/2014   CLINICAL DATA:  Entering teen alcohol last night and fell down steps. Bruising and hematoma to right forehead and night.  EXAM: RIGHT HAND - COMPLETE 3+ VIEW  COMPARISON:  None.  FINDINGS: There is no evidence of fracture or dislocation. There is no evidence of arthropathy or other focal bone abnormality. Soft tissues are unremarkable.  IMPRESSION: Negative.   Electronically Signed   By: Kerby Moors M.D.   On: 10/05/2014 16:03   Ct Maxillofacial Wo Cm  10/05/2014   CLINICAL DATA:  Fall.  Head trauma.  Fall down stairs.  EXAM: CT HEAD WITHOUT CONTRAST  CT MAXILLOFACIAL WITHOUT CONTRAST  CT CERVICAL SPINE WITHOUT CONTRAST  TECHNIQUE: Multidetector CT imaging of the head, cervical spine, and maxillofacial structures were performed using the standard protocol without intravenous contrast. Multiplanar CT image reconstructions of the cervical spine and maxillofacial structures were also generated.  COMPARISON:  01/28/2014.  FINDINGS: CT HEAD FINDINGS  No mass lesion, mass effect, midline shift, hydrocephalus, hemorrhage. No acute territorial cortical ischemia/infarct. Atrophy and chronic ischemic white matter disease is present. Chronic LEFT MCA infarct. Mastoid air cells and middle ears appear clear. Calvarium intact. Intracranial atherosclerosis.  CT MAXILLOFACIAL FINDINGS  Globes: Intact  Bony orbits:  Intact  Pterygoid plates: Intact  Mandibular condyles:  Located  Mandible: Intact.  Torus mandibularis is present.  Intracranial contents:  See above.  Paranasal sinuses: Mucosal thickening is present in the posterior RIGHT maxillary sinus. LEFT maxillary sinus appears normal. Frontal sinuses are within normal limits.  Mastoid air cells: Opacification of a few inferior RIGHT mastoid air cells. LEFT mastoid air cells clear.  Nasal bones: Intact   Soft tissues: Normal  Visible cervical spine: See below.  CT CERVICAL SPINE FINDINGS  Alignment: Straightening of the normal cervical lordosis. 2 mm anterolisthesis of C7 on T1 appears degenerative and facet mediated. Dextroconvex torticollis.  Craniocervical junction: The odontoid is intact. Occipital condyles intact. Normal C1 ring.  Vertebrae: Negative for fracture or aggressive osseous lesions.  Paraspinal soft tissues: Carotid atherosclerosis. 4 mm low-density lesion in the LEFT thyroid lobe does not merit further evaluation.  Lung apices: Grossly normal.  Severe cervical spine degenerative disease is present. This involves both discs and facets. There is ankylosis at C3-C4 with ossification of the disc. The fusion is solid. Bilateral severe facet arthrosis is present with multilevel foraminal stenosis due to a combination of uncovertebral spurring and facet spurring. Disc osteophyte complexes are most pronounced at  C5-C6 and C6-C7 producing mild central stenosis.  IMPRESSION: 1. No acute intracranial abnormality. Atrophy, chronic ischemic white matter disease and old LEFT MCA infarct. 2. Negative for facial fracture. 3. Moderate to severe cervical spine degenerative disease. No cervical spine fracture or dislocation.   Electronically Signed   By: Dereck Ligas M.D.   On: 10/05/2014 16:11    Assessment & Plan:   There are no diagnoses linked to this encounter. I am having Mr. Kuzniar maintain his mometasone, polyethylene glycol powder, folic acid, CRESTOR, albuterol, amiodarone, ramipril, spironolactone, cyanocobalamin, warfarin, pantoprazole, metoprolol succinate, NONFORMULARY OR COMPOUNDED ITEM, and rivastigmine.  No orders of the defined types were placed in this encounter.     Follow-up: Return if symptoms worsen or fail to improve.  Scarlette Calico, MD

## 2014-10-15 NOTE — Progress Notes (Signed)
Pre visit review using our clinic review tool, if applicable. No additional management support is needed unless otherwise documented below in the visit note. 

## 2014-10-16 ENCOUNTER — Ambulatory Visit (INDEPENDENT_AMBULATORY_CARE_PROVIDER_SITE_OTHER): Payer: Medicare Other

## 2014-10-16 DIAGNOSIS — J309 Allergic rhinitis, unspecified: Secondary | ICD-10-CM | POA: Diagnosis not present

## 2014-10-20 ENCOUNTER — Telehealth: Payer: Self-pay | Admitting: *Deleted

## 2014-10-20 ENCOUNTER — Other Ambulatory Visit: Payer: Self-pay | Admitting: Internal Medicine

## 2014-10-20 DIAGNOSIS — M545 Low back pain: Secondary | ICD-10-CM | POA: Diagnosis not present

## 2014-10-20 NOTE — Telephone Encounter (Signed)
That's fine. Will await clinic appointment. Thanks

## 2014-10-20 NOTE — Telephone Encounter (Signed)
Dr Havery Moros-  Patient requests refills pantoprazole. Former Dr Olevia Perches patient. Has upcoming appointment 11/2014 with you. May I fill until then?

## 2014-10-21 ENCOUNTER — Telehealth: Payer: Self-pay

## 2014-10-21 MED ORDER — PANTOPRAZOLE SODIUM 40 MG PO TBEC: 40.0000 mg | DELAYED_RELEASE_TABLET | Freq: Every day | ORAL | Status: DC

## 2014-10-21 NOTE — Telephone Encounter (Signed)
Refilled Protonix per Dr. Havery Moros

## 2014-10-21 NOTE — Telephone Encounter (Signed)
rx was filled earlier today. See 10-21-14 phone note.

## 2014-10-22 ENCOUNTER — Ambulatory Visit (INDEPENDENT_AMBULATORY_CARE_PROVIDER_SITE_OTHER): Payer: Medicare Other

## 2014-10-22 ENCOUNTER — Ambulatory Visit (INDEPENDENT_AMBULATORY_CARE_PROVIDER_SITE_OTHER): Payer: Medicare Other | Admitting: *Deleted

## 2014-10-22 DIAGNOSIS — I4891 Unspecified atrial fibrillation: Secondary | ICD-10-CM

## 2014-10-22 DIAGNOSIS — I480 Paroxysmal atrial fibrillation: Secondary | ICD-10-CM | POA: Diagnosis not present

## 2014-10-22 DIAGNOSIS — Z5181 Encounter for therapeutic drug level monitoring: Secondary | ICD-10-CM | POA: Diagnosis not present

## 2014-10-22 DIAGNOSIS — J309 Allergic rhinitis, unspecified: Secondary | ICD-10-CM | POA: Diagnosis not present

## 2014-10-22 DIAGNOSIS — Z7901 Long term (current) use of anticoagulants: Secondary | ICD-10-CM

## 2014-10-22 LAB — POCT INR: INR: 2.5

## 2014-10-23 ENCOUNTER — Ambulatory Visit: Payer: Medicare Other

## 2014-10-30 ENCOUNTER — Ambulatory Visit (INDEPENDENT_AMBULATORY_CARE_PROVIDER_SITE_OTHER): Payer: Medicare Other

## 2014-10-30 DIAGNOSIS — J309 Allergic rhinitis, unspecified: Secondary | ICD-10-CM

## 2014-11-03 ENCOUNTER — Other Ambulatory Visit: Payer: Self-pay | Admitting: Internal Medicine

## 2014-11-04 NOTE — Telephone Encounter (Signed)
Received electronic refill request for Clonazepam 0.5mg    Pt last seen by CY 04/2014, rec'd to follow up in 1 year.  However, pt was seen on 9.14.16 by his PCP for follow up for fall at which time the Clonazepam was discontinued by Dr Ronnald Ramp.  Will defer refill request to Dr Ronnald Ramp to determine if pt may restart.  CY is out of the office this week.

## 2014-11-05 ENCOUNTER — Telehealth: Payer: Self-pay | Admitting: Internal Medicine

## 2014-11-05 LAB — CUP PACEART REMOTE DEVICE CHECK
Date Time Interrogation Session: 20161012112120
Implantable Lead Implant Date: 20131223
Implantable Lead Location: 753858
Implantable Lead Location: 753859
Implantable Lead Model: 4136
Implantable Lead Serial Number: 29201222
MDC IDC LEAD IMPLANT DT: 20130614
MDC IDC LEAD IMPLANT DT: 20131223
MDC IDC LEAD LOCATION: 753860
MDC IDC LEAD MODEL: 7122
MDC IDC PG SERIAL: 7057550
Pulse Gen Model: 3265

## 2014-11-05 NOTE — Telephone Encounter (Signed)
I will assess him in the clinic. Thanks

## 2014-11-05 NOTE — Telephone Encounter (Signed)
Spoke with patient and he is having right upper abdominal pain since last night. It is worse if he bends down. Denies N, V, D, bleeding, fever or constipation. He is having normal bowel movements. Scheduled OV tomorrow at 10:45 AM with Dr.Armbruster. Patient is aware to go to ED for increase in symptoms.

## 2014-11-06 ENCOUNTER — Encounter: Payer: Self-pay | Admitting: Gastroenterology

## 2014-11-06 ENCOUNTER — Ambulatory Visit (INDEPENDENT_AMBULATORY_CARE_PROVIDER_SITE_OTHER): Payer: Medicare Other

## 2014-11-06 ENCOUNTER — Ambulatory Visit (INDEPENDENT_AMBULATORY_CARE_PROVIDER_SITE_OTHER): Payer: Medicare Other | Admitting: Gastroenterology

## 2014-11-06 VITALS — BP 130/86 | HR 68 | Ht 71.25 in | Wt 203.5 lb

## 2014-11-06 DIAGNOSIS — R1011 Right upper quadrant pain: Secondary | ICD-10-CM | POA: Diagnosis not present

## 2014-11-06 DIAGNOSIS — I5022 Chronic systolic (congestive) heart failure: Secondary | ICD-10-CM | POA: Diagnosis not present

## 2014-11-06 DIAGNOSIS — J309 Allergic rhinitis, unspecified: Secondary | ICD-10-CM | POA: Diagnosis not present

## 2014-11-06 DIAGNOSIS — T148XXA Other injury of unspecified body region, initial encounter: Secondary | ICD-10-CM

## 2014-11-06 DIAGNOSIS — I255 Ischemic cardiomyopathy: Secondary | ICD-10-CM

## 2014-11-06 DIAGNOSIS — Z95810 Presence of automatic (implantable) cardiac defibrillator: Secondary | ICD-10-CM | POA: Diagnosis not present

## 2014-11-06 DIAGNOSIS — M545 Low back pain: Secondary | ICD-10-CM | POA: Diagnosis not present

## 2014-11-06 DIAGNOSIS — T148 Other injury of unspecified body region: Secondary | ICD-10-CM

## 2014-11-06 NOTE — Progress Notes (Signed)
HPI :  79 y/o male, former patient of Dr. Olevia Perches, new to me, here for an acute visit for abdominal pain.  He reports he has had some intermittent RUQ pain. He reports this has been ongoing for a years, usually occurring intermittently / sporadically, but has had worsening recently. Pain is in the RUQ, rated up to 8-9/10 when severe, but rated 2-3/10 at this time. It started 3 days ago and improving over time, only minimal discomfort today. Pain does not radiate. No shortness of breath.  It is tender when he lies on his right side. When he bends over it makes the pain severe and reproduces it. No fevers. He has his gallbladder. No nausea or vomiting. He is eating okay. Pain is not worsening after he eats. He has not been taking any NSAIDs. He has been taking OTC pain pill but not sure if the name, maybe acetominophen. HE denies any changes in his bowels recently. He reports he has had chronic constipatoin which is unchanged. He has taken miralax for this. No blood in the stools routinely, sometimes blood noted on the toilet paper when wiping himself.   He had an MI in 43. He also has a history of A fibb and CVA. He is taking coumadin for these issues.   Prior workup in our department reviewed as below. Of note he had a fall in September in which a CT abdomen was done which was normal. He also presented to our clinic in May with the same symptoms and RUQ [place] was normal as was LFTs.  Prior workup: EGD 06/18/14 - bleeding small gastric polyps removed EGD 01/22/14 - bleeding large gastric polyp removed Colonoscopy 1/15 - severe diverticulosis left colon Flex sig 2015 - severe left sided diverticulosis CT abdomen 10/05/14 - normal liver, GB, pancreas. Diverticulosis noted US abdomen 05/30/14 - no gallstones, normal GB, fatty liver, normal spleen  Past Medical History  Diagnosis Date  . Alcohol abuse, episodic 05/19/2008  . ALLERGIC RHINITIS 04/15/2009  . COLONIC POLYPS, ADENOMATOUS, HX OF 02/14/2008  .  DIVERTICULOSIS, COLON 02/14/2008  . ESOPHAGEAL STRICTURE 01/28/2008  . GERD 02/14/2008  . HYPERGLYCEMIA 11/16/2006  . HYPERLIPIDEMIA 02/14/2008  . INSOMNIA-SLEEP DISORDER-UNSPEC 02/11/2009  . 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 PROSTATE CANCER, HX OF 02/25/2000  . TRANSIENT ISCHEMIC ATTACKS, HX OF 11/16/2006  . Arthritis   . Atrial fibrillation ([place]) 11/16/2006    remote CHF related to atrial fib with rapid ventricular response over 25 yrs per office note,  . ASTHMA 11/16/2006    sinusitis-    hx ?yeast patch/white patch on vocal cord as per [facility] 02/25/11-   . OBSTRUCTIVE SLEEP APNEA 11/10/2008  . SLEEP APNEA 10/03/2009    LOV Dr [name] 12/12 in EPIC    Moderate per patient- settings ?6/last sleep study years ago  . HYPERTENSION 11/16/2006  . Symptomatic bradycardia, secondary to sinus node dysfunction 07/08/2011    s/p [facility] Scientific PPM by Dr Sallyanne Kuster, upgraded to [place] ICD 12/13 by Dr Rayann Heman (SJM)  . Myocardial infarction ([place]) 1993  . Pacemaker     St. Jude  . ICD (implantable cardiac defibrillator) in place   . Anxiety   . Depression   . Pneumonia     hx of  . Headache(784.0)   . Cancer [facility]) 2002    prostate cancer  . CORONARY ARTERY DISEASE 11/16/2006    LHC (07/2011): LAD with luminal irregularities, proximal circumflex 50%, EF 25%  . Chronic systolic CHF (congestive heart failure) (  [place])     echo (10/02/12): EF 83-41%, grade 1 diastolic dysfunction, trivial AI, mild MR, mild RVE  . NICM (nonischemic cardiomyopathy) ([place])   . Stroke ([place])     Tia  . Lesion of vocal cord     bx begine  . Allergy     YEAR ROUND,TAKE SHOTS  . Sleep apnea   . Alzheimer's dementia     beginning with sx     Past Surgical History  Procedure Laterality Date  . Tonsilectomy, adenoidectomy, bilateral myringotomy and tubes    . Knee arthroscopy      2 surgeries right and 1 left  . Prostate surgery  2/02    prostate cancer  . Uvulopalatopharyngoplasty  2013  . Esophagogastroduodenoscopy       with dilitation  . Lumbar laminectomy/decompression microdiscectomy  03/02/2011    Procedure: LUMBAR LAMINECTOMY/DECOMPRESSION MICRODISCECTOMY;  Surgeon: [name], MD;  Location: [facility] ORS;  Service: Orthopedics;  Laterality: N/A;  Decompression Lumbar 4 - Lumbar(X-Ray)  . Pacemaker insertion  06/2011    [employer] PPM implanted by Dr [employer]  . Cardiac defibrillator placement  01/16/12    Upgrade to a biventricular SJM ICD by DR Allred  . Tonsillectomy  as child  . Cardiac catheterization      several  . Back surgery    . Insert / replace / remove pacemaker    . Total knee arthroplasty Left 06/04/2012    Procedure: TOTAL KNEE ARTHROPLASTY;  Surgeon: [name], MD;  Location: [facility] OR[facility];  Service: Orthopedics;  Laterality: Left;  . Colonoscopy    . Vocal cord lesion bx    . Permanent pacemaker insertion Left 07/08/2011    Procedure: PERMANENT PACEMAKER INSERTION;  Surgeon: [name], MD;  Location: [place] CATH LAB;  Service: Cardiovascular;  Laterality: Left;  . Left and right heart catheterization with coronary angiogram N/A 08/18/2011    Procedure: LEFT AND RIGHT HEART CATHETERIZATION WITH CORONARY ANGIOGRAM;  Surgeon: [name], MD;  Location: [place] CATH LAB;  Service: Cardiovascular;  Laterality: N/A;  . Bi-ventricular implantable cardioverter defibrillator upgrade N/A 01/16/2012    Procedure: BI-VENTRICULAR IMPLANTABLE CARDIOVERTER DEFIBRILLATOR UPGRADE;  Surgeon: Thompson Grayer, MD;  Location: [facility] CATH LAB;  Service: Cardiovascular;  Laterality: N/A;   Family History  Problem Relation Age of Onset  . Heart attack Father   . Heart attack Brother   . Heart disease Maternal Uncle   . Heart attack Maternal Uncle   . Colon cancer Neg Hx   . Esophageal cancer Neg Hx   . Rectal cancer Neg Hx   . Prostate cancer Neg Hx   . Stomach cancer Neg Hx    Social History  Substance Use Topics  . Smoking status: Former Smoker -- 1.00 packs/day for 4 years    Types: Cigarettes     Quit date: 03/22/1964  . Smokeless tobacco: Never Used     Comment: reports smoked socially  . Alcohol Use: 12.0 oz/week    20 Shots of liquor per week     Comment: 2 mixed drinks daily, some days   Current Outpatient Prescriptions  Medication Sig Dispense Refill  . albuterol (PROVENTIL HFA;VENTOLIN HFA) 108 (90 BASE) MCG/ACT inhaler Inhale 1-2 puffs into the lungs every 6 (six) hours as needed for shortness of breath. 1 Inhaler 11  . amiodarone (PACERONE) 200 MG tablet TAKE ONE-HALF TABLET DAILY 45 tablet 0  . clonazePAM (KLONOPIN) 0.5 MG tablet TAKE 1/2 TO 2 TABLETS AS NEEDED FOR SLEEP 30  tablet 3  . CRESTOR 5 MG tablet TAKE ONE TABLET IN THE EVENING (Patient taking differently: TAKE ONE TABLET BY MOUTH IN THE EVENING) 30 tablet 11  . cyanocobalamin (,VITAMIN B-12,) 1000 MCG/ML injection Inject 1000 mcg/ml weekly x 4 then monthly 4 mL 6  . folic acid (FOLVITE) 1 MG tablet Take 1 tablet (1 mg total) by mouth daily. 90 tablet 3  . metoprolol succinate (TOPROL-XL) 50 MG 24 hr tablet TAKE 1/2 TABLET AT NIGHT 30 tablet 5  . mometasone (NASONEX) 50 MCG/ACT nasal spray TWO SPRAYS IN EACH NOSTRIL DAILY AS NEEDED FOR ALLERGIES    . NONFORMULARY OR COMPOUNDED ITEM Allergy Vaccine 1:10 Given at [facility]    . pantoprazole (PROTONIX) 40 MG tablet Take 1 tablet (40 mg total) by mouth daily. 30 tablet 2  . polyethylene glycol powder (GLYCOLAX/MIRALAX) powder Take 17 g by mouth daily as needed (constipation).     . ramipril (ALTACE) 10 MG capsule TAKE TWO CAPSULES EVERY DAY 60 capsule 6  . rivastigmine (EXELON) 3 MG capsule Take 1 capsule (3 mg total) by mouth 2 (two) times daily. 60 capsule 11  . spironolactone (ALDACTONE) 25 MG tablet TAKE ONE TABLET EACH DAY 30 tablet 5  . warfarin (COUMADIN) 2.5 MG tablet Take as directed by coumadin clinic 120 tablet 0   No current facility-administered medications for this visit.   Allergies  Allergen Reactions  . Hydrocodone Other (See Comments)     Severe panic attacks  . Oxycodone Other (See Comments)    Severe panic attacks     Review of Systems: All systems reviewed and negative except where noted in HPI.    Lab Results  Component Value Date   WBC 6.3 10/05/2014   HGB 12.2* 10/05/2014   HCT 36.0* 10/05/2014   MCV 89.8 10/05/2014   PLT 176 10/05/2014   Lab Results  Component Value Date   ALT 29 10/05/2014   AST 31 10/05/2014   ALKPHOS 57 10/05/2014   BILITOT 0.6 10/05/2014   Lab Results  Component Value Date   CREATININE 1.30* 10/05/2014   BUN 27* 10/05/2014   NA 143 10/05/2014   K 3.8 10/05/2014   CL 106 10/05/2014   CO2 24 10/05/2014   Imaging reviewed as above  Physical Exam: BP 130/86 mmHg  Pulse 68  Ht 5' 11.25" (1.81 m)  Wt 203 lb 8 oz (92.307 kg)  BMI 28.18 kg/m2 Constitutional: Pleasant,well-developed, male in no acute distress. HEENT: Normocephalic and atraumatic. Conjunctivae are normal. No scleral icterus. Neck supple.  Cardiovascular: Normal rate, irregular rhythm  Pulmonary/chest: Effort normal and breath sounds normal. No wheezing, rales or rhonchi. Abdominal: Soft, nondistended. Tenderness to palpation along the right costal margin with positive Carnett. Bowel sounds active throughout. There are no masses palpable. No hepatomegaly. Extremities: trace edema Lymphadenopathy: No cervical adenopathy noted. Neurological: Alert and oriented to person place and time. Skin: Skin is warm and dry. No rashes noted. Psychiatric: Normal mood and affect. Behavior is normal.   ASSESSMENT AND PLAN: 79 y/o male with history of AFibb with CVA on coumadin, with history of chronic normocytic anemia as well as GI bleeding due to bleeding gastric polyps, here for evaluation of RUQ pain. He has been evaluated for this in the past. Prior RUQ [place] was normal other than fatty liver and he has had normal LFTs. CT in September did not show any acute pathology, normal pancreas and biliary tree. The pain is intermittent  as described above, very positional, and  elicited easily on exam with positive Carnett. He has no prandial relationship to his symptoms and his pain does not seem biliary colic. I reassured him I think he is having musculoskeletal pain, perhaps costochondritis of the lower right rib cage causing his symptoms. Given his history of GI bleeding and coumadin use, recommend tylenol PRN for this and that he avoid NSAIDs. If worsens can consider lidocaine patch. He reports it is improving since it started and only mild today. Avoid heavy lifting and monitor for now. I will order LFTs to ensure normal and CBC to ensure no leukocytosis, but reassured him I don't think he needs workup for this issue further unless he has a prandial component, fevers, or it changes. He agreed with the plan and can f/u as needed. All questions answered.   Appalachia Cellar, MD [facility]astro[facility] Pager [phone]

## 2014-11-06 NOTE — Patient Instructions (Signed)
Your physician has requested that you go to the basement for  lab work before leaving today.   

## 2014-11-07 ENCOUNTER — Telehealth: Payer: Self-pay

## 2014-11-07 NOTE — Telephone Encounter (Signed)
ICM transmission received.  Attempted patient call and left message on voice mail to return call.

## 2014-11-10 ENCOUNTER — Other Ambulatory Visit (INDEPENDENT_AMBULATORY_CARE_PROVIDER_SITE_OTHER): Payer: Medicare Other

## 2014-11-10 DIAGNOSIS — M545 Low back pain: Secondary | ICD-10-CM | POA: Diagnosis not present

## 2014-11-10 DIAGNOSIS — R1011 Right upper quadrant pain: Secondary | ICD-10-CM

## 2014-11-10 DIAGNOSIS — C61 Malignant neoplasm of prostate: Secondary | ICD-10-CM | POA: Diagnosis not present

## 2014-11-10 LAB — CBC WITH DIFFERENTIAL/PLATELET
BASOS ABS: 0 10*3/uL (ref 0.0–0.1)
Basophils Relative: 1.1 % (ref 0.0–3.0)
EOS ABS: 0.2 10*3/uL (ref 0.0–0.7)
Eosinophils Relative: 4.7 % (ref 0.0–5.0)
HEMATOCRIT: 37.4 % — AB (ref 39.0–52.0)
HEMOGLOBIN: 12.8 g/dL — AB (ref 13.0–17.0)
LYMPHS PCT: 35.3 % (ref 12.0–46.0)
Lymphs Abs: 1.3 10*3/uL (ref 0.7–4.0)
MCHC: 34.1 g/dL (ref 30.0–36.0)
MCV: 94 fl (ref 78.0–100.0)
MONO ABS: 0.3 10*3/uL (ref 0.1–1.0)
Monocytes Relative: 9.2 % (ref 3.0–12.0)
Neutro Abs: 1.8 10*3/uL (ref 1.4–7.7)
Neutrophils Relative %: 49.7 % (ref 43.0–77.0)
Platelets: 177 10*3/uL (ref 150.0–400.0)
RBC: 3.98 Mil/uL — AB (ref 4.22–5.81)
RDW: 14 % (ref 11.5–15.5)
WBC: 3.5 10*3/uL — AB (ref 4.0–10.5)

## 2014-11-10 LAB — HEPATIC FUNCTION PANEL
ALK PHOS: 62 U/L (ref 39–117)
ALT: 22 U/L (ref 0–53)
AST: 28 U/L (ref 0–37)
Albumin: 3.9 g/dL (ref 3.5–5.2)
BILIRUBIN DIRECT: 0.1 mg/dL (ref 0.0–0.3)
BILIRUBIN TOTAL: 0.7 mg/dL (ref 0.2–1.2)
TOTAL PROTEIN: 6.9 g/dL (ref 6.0–8.3)

## 2014-11-10 NOTE — Telephone Encounter (Signed)
Spoke with patient.

## 2014-11-10 NOTE — Progress Notes (Signed)
EPIC Encounter for ICM Monitoring  Patient Name: Corey [name] is a 79 y.o. male Date: 11/10/2014 Primary Care Physican: Scarlette Calico, MD Primary Cardiologist: Allred Electrophysiologist: Allred Dry Weight: 200 lb       Bi-V Pacing >99%  In the past month, have you:  1. Gained more than 2 pounds in a day or more than 5 pounds in a week? no  2. Had changes in your medications (with verification of current medications)? no  3. Had more shortness of breath than is usual for you? no  4. Limited your activity because of shortness of breath? no  5. Not been able to sleep because of shortness of breath? no  6. Had increased swelling in your feet or ankles? no  7. Had symptoms of dehydration (dizziness, dry mouth, increased thirst, decreased urine output) no  8. Had changes in sodium restriction? no  9. Been compliant with medication? Yes   ICM trend: 11/06/2014    Follow-up plan: ICM clinic phone appointment on 12/15/2014.  [place] impedance below baseline ~11/05/2014 to 11/08/2014 and he denied any HF symptoms.  He reported his main problem, at this time, is abdominal pain and he has a physician.  No changes today.  Copy of note sent to patient's primary care physician, primary cardiologist, and device following physician.  Rosalene Billings, RN, CCM 11/10/2014 1:16 PM

## 2014-11-13 ENCOUNTER — Ambulatory Visit (INDEPENDENT_AMBULATORY_CARE_PROVIDER_SITE_OTHER): Payer: Medicare Other

## 2014-11-13 ENCOUNTER — Other Ambulatory Visit: Payer: Self-pay | Admitting: Internal Medicine

## 2014-11-13 DIAGNOSIS — J309 Allergic rhinitis, unspecified: Secondary | ICD-10-CM

## 2014-11-13 DIAGNOSIS — C61 Malignant neoplasm of prostate: Secondary | ICD-10-CM | POA: Diagnosis not present

## 2014-11-17 ENCOUNTER — Other Ambulatory Visit: Payer: Self-pay | Admitting: Internal Medicine

## 2014-11-18 ENCOUNTER — Other Ambulatory Visit (INDEPENDENT_AMBULATORY_CARE_PROVIDER_SITE_OTHER): Payer: Medicare Other

## 2014-11-18 ENCOUNTER — Ambulatory Visit (INDEPENDENT_AMBULATORY_CARE_PROVIDER_SITE_OTHER): Payer: Medicare Other | Admitting: Internal Medicine

## 2014-11-18 ENCOUNTER — Encounter: Payer: Self-pay | Admitting: Internal Medicine

## 2014-11-18 VITALS — BP 140/70 | HR 68 | Temp 97.9°F | Resp 16 | Wt 206.0 lb

## 2014-11-18 DIAGNOSIS — I255 Ischemic cardiomyopathy: Secondary | ICD-10-CM | POA: Diagnosis not present

## 2014-11-18 DIAGNOSIS — D519 Vitamin B12 deficiency anemia, unspecified: Secondary | ICD-10-CM

## 2014-11-18 DIAGNOSIS — I10 Essential (primary) hypertension: Secondary | ICD-10-CM

## 2014-11-18 DIAGNOSIS — D508 Other iron deficiency anemias: Secondary | ICD-10-CM

## 2014-11-18 DIAGNOSIS — R739 Hyperglycemia, unspecified: Secondary | ICD-10-CM | POA: Diagnosis not present

## 2014-11-18 DIAGNOSIS — D518 Other vitamin B12 deficiency anemias: Secondary | ICD-10-CM | POA: Diagnosis not present

## 2014-11-18 LAB — IBC PANEL
Iron: 97 ug/dL (ref 42–165)
Saturation Ratios: 25.9 % (ref 20.0–50.0)
Transferrin: 267 mg/dL (ref 212.0–360.0)

## 2014-11-18 LAB — BASIC METABOLIC PANEL
BUN: 14 mg/dL (ref 6–23)
CALCIUM: 9.1 mg/dL (ref 8.4–10.5)
CO2: 29 mEq/L (ref 19–32)
CREATININE: 1.1 mg/dL (ref 0.40–1.50)
Chloride: 103 mEq/L (ref 96–112)
GFR: 69 mL/min (ref >60.00)
GLUCOSE: 111 mg/dL — AB (ref 70–99)
Potassium: 3.8 mEq/L (ref 3.5–5.1)
Sodium: 140 mEq/L (ref 135–145)

## 2014-11-18 LAB — HEMOGLOBIN A1C: HEMOGLOBIN A1C: 5.5 % (ref 4.6–6.5)

## 2014-11-18 LAB — CBC WITH DIFFERENTIAL/PLATELET
BASOS ABS: 0 10*3/uL (ref 0.0–0.1)
Basophils Relative: 1.2 % (ref 0.0–3.0)
EOS ABS: 0.2 10*3/uL (ref 0.0–0.7)
Eosinophils Relative: 6 % — ABNORMAL HIGH (ref 0.0–5.0)
HCT: 39.3 % (ref 39.0–52.0)
Hemoglobin: 13.4 g/dL (ref 13.0–17.0)
LYMPHS ABS: 1.4 10*3/uL (ref 0.7–4.0)
Lymphocytes Relative: 39.6 % (ref 12.0–46.0)
MCHC: 34 g/dL (ref 30.0–36.0)
MCV: 94.5 fl (ref 78.0–100.0)
MONO ABS: 0.3 10*3/uL (ref 0.1–1.0)
Monocytes Relative: 8.8 % (ref 3.0–12.0)
NEUTROS PCT: 44.4 % (ref 43.0–77.0)
Neutro Abs: 1.5 10*3/uL (ref 1.4–7.7)
Platelets: 189 10*3/uL (ref 150.0–400.0)
RBC: 4.16 Mil/uL — AB (ref 4.22–5.81)
RDW: 13.8 % (ref 11.5–15.5)
WBC: 3.5 10*3/uL — ABNORMAL LOW (ref 4.0–10.5)

## 2014-11-18 LAB — FOLATE

## 2014-11-18 LAB — VITAMIN B12: VITAMIN B 12: 763 pg/mL (ref 211–911)

## 2014-11-18 LAB — FERRITIN: FERRITIN: 128 ng/mL (ref 22.0–322.0)

## 2014-11-18 NOTE — Progress Notes (Signed)
Pre visit review using our clinic review tool, if applicable. No additional management support is needed unless otherwise documented below in the visit note. 

## 2014-11-18 NOTE — Patient Instructions (Signed)
Anemia, Nonspecific Anemia is a condition in which the concentration of red blood cells or hemoglobin in the blood is below normal. Hemoglobin is a substance in red blood cells that carries oxygen to the tissues of the body. Anemia results in not enough oxygen reaching these tissues.  CAUSES  Common causes of anemia include:   Excessive bleeding. Bleeding may be internal or external. This includes excessive bleeding from periods (in women) or from the intestine.   Poor nutrition.   Chronic kidney, thyroid, and liver disease.  Bone marrow disorders that decrease red blood cell production.  Cancer and treatments for cancer.  HIV, AIDS, and their treatments.  Spleen problems that increase red blood cell destruction.  Blood disorders.  Excess destruction of red blood cells due to infection, medicines, and autoimmune disorders. SIGNS AND SYMPTOMS   Minor weakness.   Dizziness.   Headache.  Palpitations.   Shortness of breath, especially with exercise.   Paleness.  Cold sensitivity.  Indigestion.  Nausea.  Difficulty sleeping.  Difficulty concentrating. Symptoms may occur suddenly or they may develop slowly.  DIAGNOSIS  Additional blood tests are often needed. These help your health care provider determine the best treatment. Your health care provider will check your stool for blood and look for other causes of blood loss.  TREATMENT  Treatment varies depending on the cause of the anemia. Treatment can include:   Supplements of iron, vitamin B12, or folic acid.   Hormone medicines.   A blood transfusion. This may be needed if blood loss is severe.   Hospitalization. This may be needed if there is significant continual blood loss.   Dietary changes.  Spleen removal. HOME CARE INSTRUCTIONS Keep all follow-up appointments. It often takes many weeks to correct anemia, and having your health care provider check on your condition and your response to  treatment is very important. SEEK IMMEDIATE MEDICAL CARE IF:   You develop extreme weakness, shortness of breath, or chest pain.   You become dizzy or have trouble concentrating.  You develop heavy vaginal bleeding.   You develop a rash.   You have bloody or black, tarry stools.   You faint.   You vomit up blood.   You vomit repeatedly.   You have abdominal pain.  You have a fever or persistent symptoms for more than 2-3 days.   You have a fever and your symptoms suddenly get worse.   You are dehydrated.  MAKE SURE YOU:  Understand these instructions.  Will watch your condition.  Will get help right away if you are not doing well or get worse.   This information is not intended to replace advice given to you by your health care provider. Make sure you discuss any questions you have with your health care provider.   Document Released: 02/18/2004 Document Revised: 09/12/2012 Document Reviewed: 07/06/2012 Elsevier Interactive Patient Education 2016 [employer]  

## 2014-11-18 NOTE — Progress Notes (Signed)
Subjective:  Patient ID: Corey [name], male    DOB: [birth year]  Age: 79 y.o. MRN: [id number]  CC: Follow-up and Anemia   HPI YARED BAREFOOT presents for follow-up after a recent head injury. About 6 weeks ago he was at home and fell. He feels like he is recovered for the most part and his only complaint is mild intermittent episodes of ataxia and feeling like he is pinched forward. He also complains of fatigue. He has a history of anemia but is not aware of any blood loss.  Outpatient Prescriptions Prior to Visit  Medication Sig Dispense Refill  . albuterol (PROVENTIL HFA;VENTOLIN HFA) 108 (90 BASE) MCG/ACT inhaler Inhale 1-2 puffs into the lungs every 6 (six) hours as needed for shortness of breath. 1 Inhaler 11  . amiodarone (PACERONE) 200 MG tablet TAKE ONE-HALF TABLET DAILY 45 tablet 0  . amiodarone (PACERONE) 200 MG tablet Take 0.5 tablets (100 mg total) by mouth daily. 45 tablet 0  . clonazePAM (KLONOPIN) 0.5 MG tablet TAKE 1/2 TO 2 TABLETS AS NEEDED FOR SLEEP 30 tablet 3  . CRESTOR 5 MG tablet TAKE ONE TABLET IN THE EVENING (Patient taking differently: TAKE ONE TABLET BY MOUTH IN THE EVENING) 30 tablet 11  . folic acid (FOLVITE) 1 MG tablet TAKE ONE TABLET EACH DAY 90 tablet 3  . metoprolol succinate (TOPROL-XL) 50 MG 24 hr tablet TAKE 1/2 TABLET AT NIGHT 30 tablet 5  . mometasone (NASONEX) 50 MCG/ACT nasal spray TWO SPRAYS IN EACH NOSTRIL DAILY AS NEEDED FOR ALLERGIES    . NONFORMULARY OR COMPOUNDED ITEM Allergy Vaccine 1:10 Given at [facility]    . pantoprazole (PROTONIX) 40 MG tablet Take 1 tablet (40 mg total) by mouth daily. 30 tablet 2  . polyethylene glycol powder (GLYCOLAX/MIRALAX) powder Take 17 g by mouth daily as needed (constipation).     . ramipril (ALTACE) 10 MG capsule TAKE TWO CAPSULES EVERY DAY 60 capsule 6  . rivastigmine (EXELON) 3 MG capsule Take 1 capsule (3 mg total) by mouth 2 (two) times daily. 60 capsule 11  . spironolactone (ALDACTONE) 25 MG  tablet TAKE ONE TABLET EACH DAY 30 tablet 5  . warfarin (COUMADIN) 2.5 MG tablet Take as directed by coumadin clinic 120 tablet 0  . cyanocobalamin (,VITAMIN B-12,) 1000 MCG/ML injection Inject 1000 mcg/ml weekly x 4 then monthly (Patient not taking: Reported on 11/18/2014) 4 mL 6   No facility-administered medications prior to visit.    ROS Review of Systems  Constitutional: Positive for fatigue. Negative for fever, chills, diaphoresis, activity change, appetite change and unexpected weight change.  HENT: Negative.  Negative for sore throat, trouble swallowing and voice change.   Eyes: Negative.  Negative for photophobia and visual disturbance.  Respiratory: Negative.  Negative for cough, choking, chest tightness, shortness of breath and stridor.   Cardiovascular: Negative.  Negative for chest pain, palpitations and leg swelling.  Gastrointestinal: Negative.  Negative for nausea, vomiting, abdominal pain, diarrhea, constipation and blood in stool.  Genitourinary: Negative.   Musculoskeletal: Positive for gait problem. Negative for arthralgias.  Skin: Negative.   Neurological: Negative.  Negative for dizziness, tremors, seizures, syncope, facial asymmetry, speech difficulty, weakness, light-headedness, numbness and headaches.  Hematological: Negative.  Negative for adenopathy. Does not bruise/bleed easily.  Psychiatric/Behavioral: Positive for sleep disturbance and dysphoric mood. Negative for suicidal ideas, hallucinations, behavioral problems, confusion, self-injury, decreased concentration and agitation. The patient is nervous/anxious. The patient is not hyperactive.     Objective:  BP 140/70 mmHg  Pulse 68  Temp(Src) 97.9 F (36.6 C) (Oral)  Resp 16  Wt 206 lb (93.441 kg)  SpO2 96%  BP Readings from Last 3 Encounters:  11/18/14 140/70  11/06/14 130/86  10/15/14 132/88    Wt Readings from Last 3 Encounters:  11/18/14 206 lb (93.441 kg)  11/06/14 203 lb 8 oz (92.307 kg)    10/15/14 201 lb (91.173 kg)    Physical Exam  Constitutional: He is oriented to person, place, and time. He appears well-developed and well-nourished. No distress.  HENT:  Head: Normocephalic and atraumatic.  Mouth/Throat: Oropharynx is clear and moist. No oropharyngeal exudate.  Eyes: Conjunctivae and EOM are normal. Pupils are equal, round, and reactive to light. Right eye exhibits no discharge. Left eye exhibits no discharge. No scleral icterus.  Neck: Normal range of motion. Neck supple. No JVD present. No tracheal deviation present. No thyromegaly present.  Cardiovascular: Normal rate, regular rhythm, normal heart sounds and intact distal pulses.  Exam reveals no gallop and no friction rub.   No murmur heard. Pulmonary/Chest: Effort normal and breath sounds normal. No stridor. No respiratory distress. He has no wheezes. He has no rales. He exhibits no tenderness.  Abdominal: Soft. Bowel sounds are normal. He exhibits no distension and no mass. There is no tenderness. There is no rebound and no guarding.  Musculoskeletal: Normal range of motion. He exhibits no edema or tenderness.  Lymphadenopathy:    He has no cervical adenopathy.  Neurological: He is alert and oriented to person, place, and time. He has normal reflexes. He displays normal reflexes. No cranial nerve deficit. He exhibits normal muscle tone. Coordination normal.  Skin: Skin is warm and dry. No rash noted. He is not diaphoretic. No erythema. No pallor.  Psychiatric: He has a normal mood and affect. His behavior is normal. Judgment and thought content normal.  Vitals reviewed.   Lab Results  Component Value Date   WBC 3.5* 11/18/2014   HGB 13.4 11/18/2014   HCT 39.3 11/18/2014   PLT 189.0 11/18/2014   GLUCOSE 111* 11/18/2014   CHOL 161 11/13/2013   TRIG 129.0 11/13/2013   HDL 55.50 11/13/2013   LDLCALC 80 11/13/2013   ALT 22 11/10/2014   AST 28 11/10/2014   NA 140 11/18/2014   K 3.8 11/18/2014   CL 103  11/18/2014   CREATININE 1.10 11/18/2014   BUN 14 11/18/2014   CO2 29 11/18/2014   TSH 2.76 05/01/2014   PSA 0.02* 11/13/2013   INR 2.5 10/22/2014   HGBA1C 5.5 11/18/2014    Dg Wrist Complete Right  10/05/2014  CLINICAL DATA:  Fall. EXAM: RIGHT WRIST - COMPLETE 3+ VIEW COMPARISON:  03/30/2010 FINDINGS: There is no evidence of fracture or dislocation. There is no evidence of arthropathy or other focal bone abnormality. Soft tissues are unremarkable. IMPRESSION: Negative. Electronically Signed   By: Kerby Moors M.D.   On: 10/05/2014 16:04   Ct Head Wo Contrast  10/05/2014  CLINICAL DATA:  Fall.  Head trauma.  Fall down stairs. EXAM: CT HEAD WITHOUT CONTRAST CT MAXILLOFACIAL WITHOUT CONTRAST CT CERVICAL SPINE WITHOUT CONTRAST TECHNIQUE: Multidetector CT imaging of the head, cervical spine, and maxillofacial structures were performed using the standard protocol without intravenous contrast. Multiplanar CT image reconstructions of the cervical spine and maxillofacial structures were also generated. COMPARISON:  01/28/2014. FINDINGS: CT HEAD FINDINGS No mass lesion, mass effect, midline shift, hydrocephalus, hemorrhage. No acute territorial cortical ischemia/infarct. Atrophy and chronic ischemic white matter disease  is present. Chronic LEFT MCA infarct. Mastoid air cells and middle ears appear clear. Calvarium intact. Intracranial atherosclerosis. CT MAXILLOFACIAL FINDINGS Globes: Intact Bony orbits:  Intact Pterygoid plates: Intact Mandibular condyles:  Located Mandible: Intact.  Torus mandibularis is present. Intracranial contents:  See above. Paranasal sinuses: Mucosal thickening is present in the posterior RIGHT maxillary sinus. LEFT maxillary sinus appears normal. Frontal sinuses are within normal limits. Mastoid air cells: Opacification of a few inferior RIGHT mastoid air cells. LEFT mastoid air cells clear. Nasal bones: Intact Soft tissues: Normal Visible cervical spine: See below. CT CERVICAL SPINE  FINDINGS Alignment: Straightening of the normal cervical lordosis. 2 mm anterolisthesis of C7 on T1 appears degenerative and facet mediated. Dextroconvex torticollis. Craniocervical junction: The odontoid is intact. Occipital condyles intact. Normal C1 ring. Vertebrae: Negative for fracture or aggressive osseous lesions. Paraspinal soft tissues: Carotid atherosclerosis. 4 mm low-density lesion in the LEFT thyroid lobe does not merit further evaluation. Lung apices: Grossly normal. Severe cervical spine degenerative disease is present. This involves both discs and facets. There is ankylosis at C3-C4 with ossification of the disc. The fusion is solid. Bilateral severe facet arthrosis is present with multilevel foraminal stenosis due to a combination of uncovertebral spurring and facet spurring. Disc osteophyte complexes are most pronounced at C5-C6 and C6-C7 producing mild central stenosis. IMPRESSION: 1. No acute intracranial abnormality. Atrophy, chronic ischemic white matter disease and old LEFT MCA infarct. 2. Negative for facial fracture. 3. Moderate to severe cervical spine degenerative disease. No cervical spine fracture or dislocation. Electronically Signed   By: Dereck Ligas M.D.   On: 10/05/2014 16:11   Ct Chest W Contrast  10/05/2014  CLINICAL DATA:  Fall down 15 steps last evening. Back pain. Prostate cancer. Dementia. Initial encounter. EXAM: CT CHEST, ABDOMEN, AND PELVIS WITH CONTRAST TECHNIQUE: Multidetector CT imaging of the chest, abdomen and pelvis was performed following the standard protocol during bolus administration of intravenous contrast. CONTRAST:  121mL OMNIPAQUE IOHEXOL 300 MG/ML  SOLN COMPARISON:  One-view chest x-ray from the same day. CTA chest 03/30/2010. CT abdomen and pelvis 12/26/2013. FINDINGS: CT CHEST FINDINGS The heart size is normal. Pacing wires are in place. Coronary artery calcifications are present. Atherosclerotic calcifications are present at the aortic arch.  Calcified lymph nodes are present in the left hilum. No other significant mediastinal or axillary nodes are present. A subcentimeter left thyroid nodule is present. The thoracic inlet is otherwise within normal limits. The esophagus is unremarkable. Mild dependent atelectasis is present bilaterally. Calcified nodules are noted in the lingula. The combination suggests a history of granulomatous disease. No active disease is present. Mild degenerative changes are present in the thoracic spine. No acute fracture or traumatic subluxation is evident. The ribs are intact. CT ABDOMEN AND PELVIS FINDINGS The liver is within normal limits. Calcifications in the spleen are compatible with prior granulomatous disease. The stomach, duodenum, and pancreas are unremarkable. The common bile duct and gallbladder are normal. The adrenal glands are normal bilaterally. A 7 mm cyst is noted near the upper pole of the right kidney. Kidneys are otherwise within normal limits bilaterally. Extensive diverticular changes are present throughout the sigmoid colon. There is no focal inflammation suggest diverticulitis. The more proximal colon is unremarkable. The appendix is visualized and within normal limits. Small bowel is normal. No significant adenopathy or free fluid is present. Fat is herniated into the left inguinal canal without associated bowel. Atherosclerotic changes are noted in the abdominal aorta without aneurysm. Degenerative changes are noted  in the lower lumbar spine. A vacuum disc is present at each level from L2-3 through L5-S1. There is chronic loss of disc height and endplate osteophytes at L5-S1. No acute fracture is evident. IMPRESSION: 1. No evidence for acute trauma. 2. Degenerative changes throughout the thoracolumbar spine. 3. Evidence of prior granulomatous disease with calcified left hilar lymph nodes, granulomata in the lingula, and calcifications in the spleen. 4. Extensive sigmoid colonic diverticulosis without  evidence for diverticulitis. Electronically Signed   By: San Morelle M.D.   On: 10/05/2014 16:11   Ct Cervical Spine Wo Contrast  10/05/2014  CLINICAL DATA:  Fall.  Head trauma.  Fall down stairs. EXAM: CT HEAD WITHOUT CONTRAST CT MAXILLOFACIAL WITHOUT CONTRAST CT CERVICAL SPINE WITHOUT CONTRAST TECHNIQUE: Multidetector CT imaging of the head, cervical spine, and maxillofacial structures were performed using the standard protocol without intravenous contrast. Multiplanar CT image reconstructions of the cervical spine and maxillofacial structures were also generated. COMPARISON:  01/28/2014. FINDINGS: CT HEAD FINDINGS No mass lesion, mass effect, midline shift, hydrocephalus, hemorrhage. No acute territorial cortical ischemia/infarct. Atrophy and chronic ischemic white matter disease is present. Chronic LEFT MCA infarct. Mastoid air cells and middle ears appear clear. Calvarium intact. Intracranial atherosclerosis. CT MAXILLOFACIAL FINDINGS Globes: Intact Bony orbits:  Intact Pterygoid plates: Intact Mandibular condyles:  Located Mandible: Intact.  Torus mandibularis is present. Intracranial contents:  See above. Paranasal sinuses: Mucosal thickening is present in the posterior RIGHT maxillary sinus. LEFT maxillary sinus appears normal. Frontal sinuses are within normal limits. Mastoid air cells: Opacification of a few inferior RIGHT mastoid air cells. LEFT mastoid air cells clear. Nasal bones: Intact Soft tissues: Normal Visible cervical spine: See below. CT CERVICAL SPINE FINDINGS Alignment: Straightening of the normal cervical lordosis. 2 mm anterolisthesis of C7 on T1 appears degenerative and facet mediated. Dextroconvex torticollis. Craniocervical junction: The odontoid is intact. Occipital condyles intact. Normal C1 ring. Vertebrae: Negative for fracture or aggressive osseous lesions. Paraspinal soft tissues: Carotid atherosclerosis. 4 mm low-density lesion in the LEFT thyroid lobe does not merit  further evaluation. Lung apices: Grossly normal. Severe cervical spine degenerative disease is present. This involves both discs and facets. There is ankylosis at C3-C4 with ossification of the disc. The fusion is solid. Bilateral severe facet arthrosis is present with multilevel foraminal stenosis due to a combination of uncovertebral spurring and facet spurring. Disc osteophyte complexes are most pronounced at C5-C6 and C6-C7 producing mild central stenosis. IMPRESSION: 1. No acute intracranial abnormality. Atrophy, chronic ischemic white matter disease and old LEFT MCA infarct. 2. Negative for facial fracture. 3. Moderate to severe cervical spine degenerative disease. No cervical spine fracture or dislocation. Electronically Signed   By: Dereck Ligas M.D.   On: 10/05/2014 16:11   Ct Abdomen Pelvis W Contrast  10/05/2014  CLINICAL DATA:  Fall down 15 steps last evening. Back pain. Prostate cancer. Dementia. Initial encounter. EXAM: CT CHEST, ABDOMEN, AND PELVIS WITH CONTRAST TECHNIQUE: Multidetector CT imaging of the chest, abdomen and pelvis was performed following the standard protocol during bolus administration of intravenous contrast. CONTRAST:  154mL OMNIPAQUE IOHEXOL 300 MG/ML  SOLN COMPARISON:  One-view chest x-ray from the same day. CTA chest 03/30/2010. CT abdomen and pelvis 12/26/2013. FINDINGS: CT CHEST FINDINGS The heart size is normal. Pacing wires are in place. Coronary artery calcifications are present. Atherosclerotic calcifications are present at the aortic arch. Calcified lymph nodes are present in the left hilum. No other significant mediastinal or axillary nodes are present. A subcentimeter left thyroid nodule  is present. The thoracic inlet is otherwise within normal limits. The esophagus is unremarkable. Mild dependent atelectasis is present bilaterally. Calcified nodules are noted in the lingula. The combination suggests a history of granulomatous disease. No active disease is present.  Mild degenerative changes are present in the thoracic spine. No acute fracture or traumatic subluxation is evident. The ribs are intact. CT ABDOMEN AND PELVIS FINDINGS The liver is within normal limits. Calcifications in the spleen are compatible with prior granulomatous disease. The stomach, duodenum, and pancreas are unremarkable. The common bile duct and gallbladder are normal. The adrenal glands are normal bilaterally. A 7 mm cyst is noted near the upper pole of the right kidney. Kidneys are otherwise within normal limits bilaterally. Extensive diverticular changes are present throughout the sigmoid colon. There is no focal inflammation suggest diverticulitis. The more proximal colon is unremarkable. The appendix is visualized and within normal limits. Small bowel is normal. No significant adenopathy or free fluid is present. Fat is herniated into the left inguinal canal without associated bowel. Atherosclerotic changes are noted in the abdominal aorta without aneurysm. Degenerative changes are noted in the lower lumbar spine. A vacuum disc is present at each level from L2-3 through L5-S1. There is chronic loss of disc height and endplate osteophytes at L5-S1. No acute fracture is evident. IMPRESSION: 1. No evidence for acute trauma. 2. Degenerative changes throughout the thoracolumbar spine. 3. Evidence of prior granulomatous disease with calcified left hilar lymph nodes, granulomata in the lingula, and calcifications in the spleen. 4. Extensive sigmoid colonic diverticulosis without evidence for diverticulitis. Electronically Signed   By: San Morelle M.D.   On: 10/05/2014 16:11   Dg Pelvis Portable  10/05/2014  CLINICAL DATA:  Fall. EXAM: PORTABLE PELVIS 1-2 VIEWS COMPARISON:  None FINDINGS: There is mild bilateral hip osteoarthritis. There is no evidence for acute fracture or subluxation. Degenerative disc disease noted within the lumbar spine. IMPRESSION: 1. No acute findings. 2. 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 Electronically Signed   By: Kerby Moors M.D.   On: 10/05/2014 15:05   Dg Chest Port 1 View  10/05/2014  CLINICAL DATA:  Fall. EXAM: PORTABLE CHEST - 1 VIEW COMPARISON:  05/21/2014 FINDINGS: Left-sided 3 lead pacemaker remains in place. Cardiac silhouette is likely within normal limits for AP technique. There is slight elevation of the right hemidiaphragm. The lungs are clear. No pleural effusion or pneumothorax is seen. No acute osseous abnormality is identified. IMPRESSION: No active disease. Electronically Signed   By: Logan Bores M.D.   On: 10/05/2014 15:03   Dg Knee Complete 4 Views Left  10/05/2014  CLINICAL DATA:  Knee pain EXAM: LEFT KNEE - COMPLETE 4+ VIEW COMPARISON:  06/04/2012 FINDINGS: Total knee arthroplasty reidentified. No fracture line visualized. Vascular calcifications. No evidence for dislocation. Trace suprapatellar fluid. IMPRESSION: Expected appearance after left total knee arthroplasty without focal acute finding. Electronically Signed   By: Conchita Paris M.D.   On: 10/05/2014 16:01   Dg Hand Complete Right  10/05/2014  CLINICAL DATA:  Entering teen alcohol last night and fell down steps. Bruising and hematoma to right forehead and night. EXAM: RIGHT HAND - COMPLETE 3+ VIEW COMPARISON:  None. FINDINGS: There is no evidence of fracture or dislocation. There is no evidence of arthropathy or other focal bone abnormality. Soft tissues are unremarkable. IMPRESSION: Negative. Electronically Signed   By: Kerby Moors M.D.   On: 10/05/2014 16:03   Ct Maxillofacial Wo Cm  10/05/2014  CLINICAL DATA:  Fall.  Head trauma.  Fall down stairs. EXAM: CT HEAD WITHOUT CONTRAST CT MAXILLOFACIAL WITHOUT CONTRAST CT CERVICAL SPINE WITHOUT CONTRAST TECHNIQUE: Multidetector CT imaging of the head, cervical spine, and maxillofacial structures were performed using the standard protocol without intravenous contrast. Multiplanar CT image reconstructions  of the cervical spine and maxillofacial structures were also generated. COMPARISON:  01/28/2014. FINDINGS: CT HEAD FINDINGS No mass lesion, mass effect, midline shift, hydrocephalus, hemorrhage. No acute territorial cortical ischemia/infarct. Atrophy and chronic ischemic white matter disease is present. Chronic LEFT MCA infarct. Mastoid air cells and middle ears appear clear. Calvarium intact. Intracranial atherosclerosis. CT MAXILLOFACIAL FINDINGS Globes: Intact Bony orbits:  Intact Pterygoid plates: Intact Mandibular condyles:  Located Mandible: Intact.  Torus mandibularis is present. Intracranial contents:  See above. Paranasal sinuses: Mucosal thickening is present in the posterior RIGHT maxillary sinus. LEFT maxillary sinus appears normal. Frontal sinuses are within normal limits. Mastoid air cells: Opacification of a few inferior RIGHT mastoid air cells. LEFT mastoid air cells clear. Nasal bones: Intact Soft tissues: Normal Visible cervical spine: See below. CT CERVICAL SPINE FINDINGS Alignment: Straightening of the normal cervical lordosis. 2 mm anterolisthesis of C7 on T1 appears degenerative and facet mediated. Dextroconvex torticollis. Craniocervical junction: The odontoid is intact. Occipital condyles intact. Normal C1 ring. Vertebrae: Negative for fracture or aggressive osseous lesions. Paraspinal soft tissues: Carotid atherosclerosis. 4 mm low-density lesion in the LEFT thyroid lobe does not merit further evaluation. Lung apices: Grossly normal. Severe cervical spine degenerative disease is present. This involves both discs and facets. There is ankylosis at C3-C4 with ossification of the disc. The fusion is solid. Bilateral severe facet arthrosis is present with multilevel foraminal stenosis due to a combination of uncovertebral spurring and facet spurring. Disc osteophyte complexes are most pronounced at C5-C6 and C6-C7 producing mild central stenosis. IMPRESSION: 1. No acute intracranial abnormality.  Atrophy, chronic ischemic white matter disease and old LEFT MCA infarct. 2. Negative for facial fracture. 3. Moderate to severe cervical spine degenerative disease. No cervical spine fracture or dislocation. Electronically Signed   By: Dereck Ligas M.D.   On: 10/05/2014 16:11    Assessment & Plan:   Darshan was seen today for follow-up and anemia.  Diagnoses and all orders for this visit:  Essential hypertension- his blood pressure is well-controlled, electrolytes and renal function are stable. He is recovering as expected after head injury. His CT scan was normal. His exam today is unremarkable. He was asked to stop drinking alcohol. -     Basic metabolic panel; Future  B12 deficiency anemia- his CBC and B12 levels are normal, he will continue the current therapy. -     CBC with Differential/Platelet; Future -     Folate; Future -     Vitamin B12; Future -     Methylmalonic acid, serum; Future  Hyperglycemia- he has mild prediabetes, we'll continue to monitor. -     Basic metabolic panel; Future -     Hemoglobin A1c; Future  Other iron deficiency anemias- his CBC and iron levels are normal, for now he does not need iron replacement therapy. -     CBC with Differential/Platelet; Future -     Ferritin; Future -     IBC panel; Future  I am having Mr. Hyun maintain his mometasone, polyethylene glycol powder, CRESTOR, albuterol, ramipril, spironolactone, cyanocobalamin, warfarin, metoprolol succinate, NONFORMULARY OR COMPOUNDED ITEM, rivastigmine, amiodarone, pantoprazole, clonazePAM, amiodarone, folic acid, and Z-61.  Meds ordered this encounter  Medications  . Cyanocobalamin (  B-12) 100 MCG TABS    Sig: Take by mouth.     Follow-up: Return in about 6 months (around 05/19/2015).  Scarlette Calico, MD

## 2014-11-20 ENCOUNTER — Ambulatory Visit: Payer: Medicare Other

## 2014-11-20 ENCOUNTER — Telehealth: Payer: Self-pay | Admitting: Internal Medicine

## 2014-11-20 NOTE — Telephone Encounter (Signed)
Pt called in and would like a nurse to call him back about the lab results he just had done?

## 2014-11-21 ENCOUNTER — Other Ambulatory Visit: Payer: Self-pay | Admitting: Internal Medicine

## 2014-11-21 LAB — METHYLMALONIC ACID, SERUM: Methylmalonic Acid, Quant: 119 nmol/L (ref 87–318)

## 2014-11-21 NOTE — Telephone Encounter (Signed)
LMOVM

## 2014-11-21 NOTE — Telephone Encounter (Signed)
Pt wanted labs because he is going out of town Briefly went over results. Informed pt Dr. Ronnald Ramp is out of office. If further info is required on labs will call pt on Monday

## 2014-11-22 NOTE — Telephone Encounter (Signed)
His labs were ok I don't see any concerns

## 2014-11-24 ENCOUNTER — Other Ambulatory Visit: Payer: Self-pay | Admitting: Internal Medicine

## 2014-11-24 NOTE — Telephone Encounter (Signed)
Inform pt of Dr. Ronnald Ramp response. Pt also stated that he is taking B12 Vitamins 1 tab daily, he was wondering if he can take 2 tab on the day that he feels weak (not enough energy). Pleas call his cell phone if no answer please call home phone and okey to leave detail massage on home phone.   Cell # [phone] Home # [phone] to leave detail massage.

## 2014-11-24 NOTE — Telephone Encounter (Signed)
Pt informed

## 2014-11-24 NOTE — Telephone Encounter (Signed)
Sure, yes, of course

## 2014-12-04 ENCOUNTER — Telehealth: Payer: Self-pay | Admitting: Internal Medicine

## 2014-12-04 NOTE — Telephone Encounter (Signed)
New message      *STAT* If patient is at the pharmacy, call can be transferred to refill team.   1. Which medications need to be refilled? (please list name of each medication and dose if known)  Warfarin 2.5mg   2. Which pharmacy/location (including street and city if local pharmacy) is medication to be sent to? [facility]  3. Do they need a 30 day or 90 day supply? [place]

## 2014-12-04 NOTE — Telephone Encounter (Signed)
Left a message for the patient in reference to the Warfarin 2.5mg  refill request; the patient needs an appointment to have INR checked.  The patient is overdue for an INR follow-up;he has not been seen in the [facility] office since 10/22/14 and was due to return for an INR check on 11/12/14.

## 2014-12-05 NOTE — Telephone Encounter (Signed)
Son states pt is on a cruise and not due back until 12/12/14. Thus, appt made for [facility] on 12/15/14. He will email his mom and sister on cruise to make sure pt has all of his meds and let us know if he's out of Rxs.

## 2014-12-15 ENCOUNTER — Other Ambulatory Visit: Payer: Self-pay | Admitting: Internal Medicine

## 2014-12-15 ENCOUNTER — Ambulatory Visit (INDEPENDENT_AMBULATORY_CARE_PROVIDER_SITE_OTHER): Payer: Medicare Other | Admitting: Pharmacist

## 2014-12-15 ENCOUNTER — Ambulatory Visit: Payer: Medicare Other | Admitting: Gastroenterology

## 2014-12-15 ENCOUNTER — Ambulatory Visit (INDEPENDENT_AMBULATORY_CARE_PROVIDER_SITE_OTHER): Payer: Medicare Other | Admitting: *Deleted

## 2014-12-15 DIAGNOSIS — Z7901 Long term (current) use of anticoagulants: Secondary | ICD-10-CM

## 2014-12-15 DIAGNOSIS — I480 Paroxysmal atrial fibrillation: Secondary | ICD-10-CM | POA: Diagnosis not present

## 2014-12-15 DIAGNOSIS — I4891 Unspecified atrial fibrillation: Secondary | ICD-10-CM

## 2014-12-15 DIAGNOSIS — I495 Sick sinus syndrome: Secondary | ICD-10-CM

## 2014-12-15 DIAGNOSIS — Z95810 Presence of automatic (implantable) cardiac defibrillator: Secondary | ICD-10-CM

## 2014-12-15 DIAGNOSIS — Z5181 Encounter for therapeutic drug level monitoring: Secondary | ICD-10-CM

## 2014-12-15 DIAGNOSIS — I509 Heart failure, unspecified: Secondary | ICD-10-CM | POA: Diagnosis not present

## 2014-12-15 LAB — POCT INR: INR: 2.5

## 2014-12-15 NOTE — Progress Notes (Signed)
Remote ICD transmission.   

## 2014-12-16 ENCOUNTER — Telehealth: Payer: Self-pay

## 2014-12-16 LAB — CUP PACEART REMOTE DEVICE CHECK
Battery Remaining Percentage: 59 %
Battery Voltage: 2.93 V
Brady Statistic AP VP Percent: 99 %
Brady Statistic AS VP Percent: 1 %
Brady Statistic RA Percent Paced: 99 %
HIGH POWER IMPEDANCE MEASURED VALUE: 77 Ohm
HighPow Impedance: 77 Ohm
Implantable Lead Implant Date: 20130614
Implantable Lead Implant Date: 20131223
Implantable Lead Model: 4136
Lead Channel Impedance Value: 480 Ohm
Lead Channel Sensing Intrinsic Amplitude: 12 mV
Lead Channel Sensing Intrinsic Amplitude: 2 mV
Lead Channel Setting Pacing Amplitude: 2 V
Lead Channel Setting Pacing Amplitude: 2 V
Lead Channel Setting Pacing Pulse Width: 0.5 ms
Lead Channel Setting Pacing Pulse Width: 0.5 ms
MDC IDC LEAD IMPLANT DT: 20131223
MDC IDC LEAD LOCATION: 753858
MDC IDC LEAD LOCATION: 753859
MDC IDC LEAD LOCATION: 753860
MDC IDC LEAD MODEL: 7122
MDC IDC LEAD SERIAL: 29201222
MDC IDC MSMT BATTERY REMAINING LONGEVITY: 49 mo
MDC IDC MSMT LEADCHNL LV IMPEDANCE VALUE: 1250 Ohm
MDC IDC MSMT LEADCHNL RA IMPEDANCE VALUE: 540 Ohm
MDC IDC PG SERIAL: 7057550
MDC IDC SESS DTM: 20161121070016
MDC IDC SET LEADCHNL RV PACING AMPLITUDE: 2.5 V
MDC IDC SET LEADCHNL RV SENSING SENSITIVITY: 0.5 mV
MDC IDC STAT BRADY AP VS PERCENT: 1 %
MDC IDC STAT BRADY AS VS PERCENT: 1 %

## 2014-12-16 NOTE — Progress Notes (Signed)
EPIC Encounter for ICM Monitoring  Patient Name: Corey [name] is a 79 y.o. male Date: 12/16/2014 Primary Care Physican: Scarlette Calico, MD Primary Cardiologist: Allred Electrophysiologist: Allred Dry Weight: 200 lb       In the past month, have you:  1. Gained more than 2 pounds in a day or more than 5 pounds in a week? no  2. Had changes in your medications (with verification of current medications)? no  3. Had more shortness of breath than is usual for you? no  4. Limited your activity because of shortness of breath? no  5. Not been able to sleep because of shortness of breath? no  6. Had increased swelling in your feet or ankles? no  7. Had symptoms of dehydration (dizziness, dry mouth, increased thirst, decreased urine output) no  8. Had changes in sodium restriction? no  9. Been compliant with medication? Yes   ICM trend:   Follow-up plan: ICM clinic phone appointment on 01/20/2015.  [place]vue daily impedance above baseline 11/25/2014 to 12/14/2014 suggesting dryness.  He reported he was on a cruise in the [place] from 11/25/2014 to 12/12/2014.  He stated he did not drink a lot of fluids during that time.  Advised to increase the fluids for next few days to balance out the fluids.  Education given general recommendation for fluid intake is 64 oz daily.  He stated he usually does not drink that much.  He denied any dehydration symptoms.  No changes today.     Copy of note sent to patient's primary care physician, primary cardiologist, and device following physician.  Rosalene Billings, RN, CCM 12/16/2014 5:59 PM

## 2014-12-16 NOTE — Telephone Encounter (Signed)
ICM transmission received.  Attempted call and wife stated to call back in about 15 minutes if possible.  Stated I would try to call back this evening if not, then tomorrow

## 2014-12-17 ENCOUNTER — Encounter: Payer: Self-pay | Admitting: Internal Medicine

## 2014-12-17 ENCOUNTER — Ambulatory Visit (INDEPENDENT_AMBULATORY_CARE_PROVIDER_SITE_OTHER): Payer: Medicare Other

## 2014-12-17 ENCOUNTER — Other Ambulatory Visit (INDEPENDENT_AMBULATORY_CARE_PROVIDER_SITE_OTHER): Payer: Medicare Other

## 2014-12-17 ENCOUNTER — Encounter: Payer: Self-pay | Admitting: Cardiology

## 2014-12-17 ENCOUNTER — Ambulatory Visit (INDEPENDENT_AMBULATORY_CARE_PROVIDER_SITE_OTHER): Payer: Medicare Other | Admitting: Internal Medicine

## 2014-12-17 VITALS — BP 120/88 | HR 73 | Temp 98.4°F | Resp 16 | Ht 71.25 in | Wt 200.0 lb

## 2014-12-17 DIAGNOSIS — I2510 Atherosclerotic heart disease of native coronary artery without angina pectoris: Secondary | ICD-10-CM

## 2014-12-17 DIAGNOSIS — E785 Hyperlipidemia, unspecified: Secondary | ICD-10-CM | POA: Diagnosis not present

## 2014-12-17 DIAGNOSIS — I10 Essential (primary) hypertension: Secondary | ICD-10-CM

## 2014-12-17 DIAGNOSIS — I255 Ischemic cardiomyopathy: Secondary | ICD-10-CM

## 2014-12-17 DIAGNOSIS — Z23 Encounter for immunization: Secondary | ICD-10-CM

## 2014-12-17 DIAGNOSIS — R739 Hyperglycemia, unspecified: Secondary | ICD-10-CM

## 2014-12-17 DIAGNOSIS — J309 Allergic rhinitis, unspecified: Secondary | ICD-10-CM | POA: Diagnosis not present

## 2014-12-17 LAB — LIPID PANEL
CHOLESTEROL: 140 mg/dL (ref 0–200)
HDL: 46.4 mg/dL (ref >39.00)
LDL CALC: 68 mg/dL (ref 0–99)
NonHDL: 93.21
TRIGLYCERIDES: 128 mg/dL (ref 0.0–149.0)
Total CHOL/HDL Ratio: 3
VLDL: 25.6 mg/dL (ref 0.0–40.0)

## 2014-12-17 LAB — TSH: TSH: 1.36 u[IU]/mL (ref 0.35–4.50)

## 2014-12-17 LAB — BASIC METABOLIC PANEL
BUN: 19 mg/dL (ref 6–23)
CALCIUM: 9.4 mg/dL (ref 8.4–10.5)
CO2: 27 mEq/L (ref 19–32)
CREATININE: 1.22 mg/dL (ref 0.40–1.50)
Chloride: 103 mEq/L (ref 96–112)
GFR: 61.22 mL/min (ref >60.00)
GLUCOSE: 128 mg/dL — AB (ref 70–99)
POTASSIUM: 4.8 meq/L (ref 3.5–5.1)
Sodium: 138 mEq/L (ref 135–145)

## 2014-12-17 NOTE — Progress Notes (Signed)
Pre visit review using our clinic review tool, if applicable. No additional management support is needed unless otherwise documented below in the visit note. 

## 2014-12-17 NOTE — Progress Notes (Signed)
Subjective:  Patient ID: Corey [name], male    DOB: 1935/08/07  Age: 79 y.o. MRN: [id number]  CC: Hypertension and Hyperlipidemia   HPI Corey [name] presents for f/up on HTN and high cholesterol. He feels well and offers no complaints.  Outpatient Prescriptions Prior to Visit  Medication Sig Dispense Refill  . albuterol (PROVENTIL HFA;VENTOLIN HFA) 108 (90 BASE) MCG/ACT inhaler Inhale 1-2 puffs into the lungs every 6 (six) hours as needed for shortness of breath. 1 Inhaler 11  . amiodarone (PACERONE) 200 MG tablet TAKE ONE-HALF TABLET DAILY 45 tablet 0  . amiodarone (PACERONE) 200 MG tablet Take 0.5 tablets (100 mg total) by mouth daily. 45 tablet 0  . clonazePAM (KLONOPIN) 0.5 MG tablet TAKE 1/2 TO 2 TABLETS AS NEEDED FOR SLEEP 30 tablet 3  . cyanocobalamin (,VITAMIN B-12,) 1000 MCG/ML injection Inject 1000 mcg/ml weekly x 4 then monthly 4 mL 6  . Cyanocobalamin (B-12) 100 MCG TABS Take by mouth.    . folic acid (FOLVITE) 1 MG tablet TAKE ONE TABLET EACH DAY 90 tablet 3  . metoprolol succinate (TOPROL-XL) 50 MG 24 hr tablet TAKE 1/2 TABLET AT NIGHT 30 tablet 5  . mometasone (NASONEX) 50 MCG/ACT nasal spray TWO SPRAYS IN EACH NOSTRIL DAILY AS NEEDED FOR ALLERGIES    . NONFORMULARY OR COMPOUNDED ITEM Allergy Vaccine 1:10 Given at [facility]    . pantoprazole (PROTONIX) 40 MG tablet Take 1 tablet (40 mg total) by mouth daily. 30 tablet 2  . polyethylene glycol powder (GLYCOLAX/MIRALAX) powder Take 17 g by mouth daily as needed (constipation).     . ramipril (ALTACE) 10 MG capsule TAKE TWO CAPSULES EVERY DAY 60 capsule 5  . rivastigmine (EXELON) 3 MG capsule Take 1 capsule (3 mg total) by mouth 2 (two) times daily. 60 capsule 11  . rosuvastatin (CRESTOR) 5 MG tablet TAKE ONE TABLET IN THE EVENING 30 tablet 11  . spironolactone (ALDACTONE) 25 MG tablet TAKE ONE TABLET EACH DAY 30 tablet 0  . warfarin (COUMADIN) 2.5 MG tablet TAKE AS DIRECTED BY COUMADIN CLINIC 120 tablet 1     No facility-administered medications prior to visit.    ROS Review of Systems  Constitutional: Negative.   HENT: Negative.   Eyes: Negative.  Negative for visual disturbance.  Respiratory: Negative.  Negative for cough, choking, chest tightness, shortness of breath and stridor.   Cardiovascular: Negative.  Negative for chest pain, palpitations and leg swelling.  Gastrointestinal: Negative.  Negative for nausea, vomiting, abdominal pain, diarrhea, constipation and blood in stool.  Endocrine: Negative.   Genitourinary: Negative.   Musculoskeletal: Negative.   Skin: Negative.   Neurological: Negative.  Negative for tremors, facial asymmetry, weakness and headaches.  Hematological: Negative.  Negative for adenopathy. Does not bruise/bleed easily.  Psychiatric/Behavioral: Negative.  Negative for sleep disturbance, dysphoric mood and decreased concentration. The patient is not nervous/anxious.     Objective:  BP 120/88 mmHg  Pulse 73  Temp(Src) 98.4 F (36.9 C) (Oral)  Resp 16  Ht 5' 11.25" (1.81 m)  Wt 200 lb (90.719 kg)  BMI 27.69 kg/m2  SpO2 98%  BP Readings from Last 3 Encounters:  12/17/14 120/88  11/18/14 140/70  11/06/14 130/86    Wt Readings from Last 3 Encounters:  12/17/14 200 lb (90.719 kg)  11/18/14 206 lb (93.441 kg)  11/06/14 203 lb 8 oz (92.307 kg)    Physical Exam  Constitutional: He is oriented to person, place, and time. No distress.  HENT:  Head: Normocephalic and atraumatic.  Mouth/Throat: Oropharynx is clear and moist. No oropharyngeal exudate.  Eyes: Conjunctivae are normal. Right eye exhibits no discharge. Left eye exhibits no discharge. No scleral icterus.  Neck: Normal range of motion. Neck supple. No JVD present. No tracheal deviation present. No thyromegaly present.  Cardiovascular: Normal rate, regular rhythm, normal heart sounds and intact distal pulses.  Exam reveals no gallop and no friction rub.   No murmur heard. Pulmonary/Chest:  Effort normal and breath sounds normal. No stridor. No respiratory distress. He has no wheezes. He has no rales. He exhibits no tenderness.  Abdominal: Soft. Bowel sounds are normal. He exhibits no distension and no mass. There is no tenderness. There is no rebound and no guarding.  Musculoskeletal: Normal range of motion. He exhibits no edema or tenderness.  Lymphadenopathy:    He has no cervical adenopathy.  Neurological: He is oriented to person, place, and time.  Skin: Skin is warm and dry. No rash noted. He is not diaphoretic. No erythema. No pallor.  Psychiatric: He has a normal mood and affect. His behavior is normal. Judgment and thought content normal.  Vitals reviewed.   Lab Results  Component Value Date   WBC 3.5* 11/18/2014   HGB 13.4 11/18/2014   HCT 39.3 11/18/2014   PLT 189.0 11/18/2014   GLUCOSE 128* 12/17/2014   CHOL 140 12/17/2014   TRIG 128.0 12/17/2014   HDL 46.40 12/17/2014   LDLCALC 68 12/17/2014   ALT 22 11/10/2014   AST 28 11/10/2014   NA 138 12/17/2014   K 4.8 12/17/2014   CL 103 12/17/2014   CREATININE 1.22 12/17/2014   BUN 19 12/17/2014   CO2 27 12/17/2014   TSH 1.36 12/17/2014   PSA 0.02* 11/13/2013   INR 2.5 12/15/2014   HGBA1C 5.5 11/18/2014    Dg Wrist Complete Right  10/05/2014  CLINICAL DATA:  Fall. EXAM: RIGHT WRIST - COMPLETE 3+ VIEW COMPARISON:  03/30/2010 FINDINGS: There is no evidence of fracture or dislocation. There is no evidence of arthropathy or other focal bone abnormality. Soft tissues are unremarkable. IMPRESSION: Negative. Electronically Signed   By: Kerby Moors M.D.   On: 10/05/2014 16:04   Ct Head Wo Contrast  10/05/2014  CLINICAL DATA:  Fall.  Head trauma.  Fall down stairs. EXAM: CT HEAD WITHOUT CONTRAST CT MAXILLOFACIAL WITHOUT CONTRAST CT CERVICAL SPINE WITHOUT CONTRAST TECHNIQUE: Multidetector CT imaging of the head, cervical spine, and maxillofacial structures were performed using the standard protocol without  intravenous contrast. Multiplanar CT image reconstructions of the cervical spine and maxillofacial structures were also generated. COMPARISON:  01/28/2014. FINDINGS: CT HEAD FINDINGS No mass lesion, mass effect, midline shift, hydrocephalus, hemorrhage. No acute territorial cortical ischemia/infarct. Atrophy and chronic ischemic white matter disease is present. Chronic LEFT MCA infarct. Mastoid air cells and middle ears appear clear. Calvarium intact. Intracranial atherosclerosis. CT MAXILLOFACIAL FINDINGS Globes: Intact Bony orbits:  Intact Pterygoid plates: Intact Mandibular condyles:  Located Mandible: Intact.  Torus mandibularis is present. Intracranial contents:  See above. Paranasal sinuses: Mucosal thickening is present in the posterior RIGHT maxillary sinus. LEFT maxillary sinus appears normal. Frontal sinuses are within normal limits. Mastoid air cells: Opacification of a few inferior RIGHT mastoid air cells. LEFT mastoid air cells clear. Nasal bones: Intact Soft tissues: Normal Visible cervical spine: See below. CT CERVICAL SPINE FINDINGS Alignment: Straightening of the normal cervical lordosis. 2 mm anterolisthesis of C7 on T1 appears degenerative and facet mediated. Dextroconvex torticollis. Craniocervical junction: The odontoid is  intact. Occipital condyles intact. Normal C1 ring. Vertebrae: Negative for fracture or aggressive osseous lesions. Paraspinal soft tissues: Carotid atherosclerosis. 4 mm low-density lesion in the LEFT thyroid lobe does not merit further evaluation. Lung apices: Grossly normal. Severe cervical spine degenerative disease is present. This involves both discs and facets. There is ankylosis at C3-C4 with ossification of the disc. The fusion is solid. Bilateral severe facet arthrosis is present with multilevel foraminal stenosis due to a combination of uncovertebral spurring and facet spurring. Disc osteophyte complexes are most pronounced at C5-C6 and C6-C7 producing mild central  stenosis. IMPRESSION: 1. No acute intracranial abnormality. Atrophy, chronic ischemic white matter disease and old LEFT MCA infarct. 2. Negative for facial fracture. 3. Moderate to severe cervical spine degenerative disease. No cervical spine fracture or dislocation. Electronically Signed   By: Dereck Ligas M.D.   On: 10/05/2014 16:11   Ct Chest W Contrast  10/05/2014  CLINICAL DATA:  Fall down 15 steps last evening. Back pain. Prostate cancer. Dementia. Initial encounter. EXAM: CT CHEST, ABDOMEN, AND PELVIS WITH CONTRAST TECHNIQUE: Multidetector CT imaging of the chest, abdomen and pelvis was performed following the standard protocol during bolus administration of intravenous contrast. CONTRAST:  139mL OMNIPAQUE IOHEXOL 300 MG/ML  SOLN COMPARISON:  One-view chest x-ray from the same day. CTA chest 03/30/2010. CT abdomen and pelvis 12/26/2013. FINDINGS: CT CHEST FINDINGS The heart size is normal. Pacing wires are in place. Coronary artery calcifications are present. Atherosclerotic calcifications are present at the aortic arch. Calcified lymph nodes are present in the left hilum. No other significant mediastinal or axillary nodes are present. A subcentimeter left thyroid nodule is present. The thoracic inlet is otherwise within normal limits. The esophagus is unremarkable. Mild dependent atelectasis is present bilaterally. Calcified nodules are noted in the lingula. The combination suggests a history of granulomatous disease. No active disease is present. Mild degenerative changes are present in the thoracic spine. No acute fracture or traumatic subluxation is evident. The ribs are intact. CT ABDOMEN AND PELVIS FINDINGS The liver is within normal limits. Calcifications in the spleen are compatible with prior granulomatous disease. The stomach, duodenum, and pancreas are unremarkable. The common bile duct and gallbladder are normal. The adrenal glands are normal bilaterally. A 7 mm cyst is noted near the upper  pole of the right kidney. Kidneys are otherwise within normal limits bilaterally. Extensive diverticular changes are present throughout the sigmoid colon. There is no focal inflammation suggest diverticulitis. The more proximal colon is unremarkable. The appendix is visualized and within normal limits. Small bowel is normal. No significant adenopathy or free fluid is present. Fat is herniated into the left inguinal canal without associated bowel. Atherosclerotic changes are noted in the abdominal aorta without aneurysm. Degenerative changes are noted in the lower lumbar spine. A vacuum disc is present at each level from L2-3 through L5-S1. There is chronic loss of disc height and endplate osteophytes at L5-S1. No acute fracture is evident. IMPRESSION: 1. No evidence for acute trauma. 2. Degenerative changes throughout the thoracolumbar spine. 3. Evidence of prior granulomatous disease with calcified left hilar lymph nodes, granulomata in the lingula, and calcifications in the spleen. 4. Extensive sigmoid colonic diverticulosis without evidence for diverticulitis. Electronically Signed   By: San Morelle M.D.   On: 10/05/2014 16:11   Ct Cervical Spine Wo Contrast  10/05/2014  CLINICAL DATA:  Fall.  Head trauma.  Fall down stairs. EXAM: CT HEAD WITHOUT CONTRAST CT MAXILLOFACIAL WITHOUT CONTRAST CT CERVICAL SPINE WITHOUT CONTRAST  TECHNIQUE: Multidetector CT imaging of the head, cervical spine, and maxillofacial structures were performed using the standard protocol without intravenous contrast. Multiplanar CT image reconstructions of the cervical spine and maxillofacial structures were also generated. COMPARISON:  01/28/2014. FINDINGS: CT HEAD FINDINGS No mass lesion, mass effect, midline shift, hydrocephalus, hemorrhage. No acute territorial cortical ischemia/infarct. Atrophy and chronic ischemic white matter disease is present. Chronic LEFT MCA infarct. Mastoid air cells and middle ears appear clear.  Calvarium intact. Intracranial atherosclerosis. CT MAXILLOFACIAL FINDINGS Globes: Intact Bony orbits:  Intact Pterygoid plates: Intact Mandibular condyles:  Located Mandible: Intact.  Torus mandibularis is present. Intracranial contents:  See above. Paranasal sinuses: Mucosal thickening is present in the posterior RIGHT maxillary sinus. LEFT maxillary sinus appears normal. Frontal sinuses are within normal limits. Mastoid air cells: Opacification of a few inferior RIGHT mastoid air cells. LEFT mastoid air cells clear. Nasal bones: Intact Soft tissues: Normal Visible cervical spine: See below. CT CERVICAL SPINE FINDINGS Alignment: Straightening of the normal cervical lordosis. 2 mm anterolisthesis of C7 on T1 appears degenerative and facet mediated. Dextroconvex torticollis. Craniocervical junction: The odontoid is intact. Occipital condyles intact. Normal C1 ring. Vertebrae: Negative for fracture or aggressive osseous lesions. Paraspinal soft tissues: Carotid atherosclerosis. 4 mm low-density lesion in the LEFT thyroid lobe does not merit further evaluation. Lung apices: Grossly normal. Severe cervical spine degenerative disease is present. This involves both discs and facets. There is ankylosis at C3-C4 with ossification of the disc. The fusion is solid. Bilateral severe facet arthrosis is present with multilevel foraminal stenosis due to a combination of uncovertebral spurring and facet spurring. Disc osteophyte complexes are most pronounced at C5-C6 and C6-C7 producing mild central stenosis. IMPRESSION: 1. No acute intracranial abnormality. Atrophy, chronic ischemic white matter disease and old LEFT MCA infarct. 2. Negative for facial fracture. 3. Moderate to severe cervical spine degenerative disease. No cervical spine fracture or dislocation. Electronically Signed   By: Dereck Ligas M.D.   On: 10/05/2014 16:11   Ct Abdomen Pelvis W Contrast  10/05/2014  CLINICAL DATA:  Fall down 15 steps last evening.  Back pain. Prostate cancer. Dementia. Initial encounter. EXAM: CT CHEST, ABDOMEN, AND PELVIS WITH CONTRAST TECHNIQUE: Multidetector CT imaging of the chest, abdomen and pelvis was performed following the standard protocol during bolus administration of intravenous contrast. CONTRAST:  167mL OMNIPAQUE IOHEXOL 300 MG/ML  SOLN COMPARISON:  One-view chest x-ray from the same day. CTA chest 03/30/2010. CT abdomen and pelvis 12/26/2013. FINDINGS: CT CHEST FINDINGS The heart size is normal. Pacing wires are in place. Coronary artery calcifications are present. Atherosclerotic calcifications are present at the aortic arch. Calcified lymph nodes are present in the left hilum. No other significant mediastinal or axillary nodes are present. A subcentimeter left thyroid nodule is present. The thoracic inlet is otherwise within normal limits. The esophagus is unremarkable. Mild dependent atelectasis is present bilaterally. Calcified nodules are noted in the lingula. The combination suggests a history of granulomatous disease. No active disease is present. Mild degenerative changes are present in the thoracic spine. No acute fracture or traumatic subluxation is evident. The ribs are intact. CT ABDOMEN AND PELVIS FINDINGS The liver is within normal limits. Calcifications in the spleen are compatible with prior granulomatous disease. The stomach, duodenum, and pancreas are unremarkable. The common bile duct and gallbladder are normal. The adrenal glands are normal bilaterally. A 7 mm cyst is noted near the upper pole of the right kidney. Kidneys are otherwise within normal limits bilaterally. Extensive diverticular changes  are present throughout the sigmoid colon. There is no focal inflammation suggest diverticulitis. The more proximal colon is unremarkable. The appendix is visualized and within normal limits. Small bowel is normal. No significant adenopathy or free fluid is present. Fat is herniated into the left inguinal canal  without associated bowel. Atherosclerotic changes are noted in the abdominal aorta without aneurysm. Degenerative changes are noted in the lower lumbar spine. A vacuum disc is present at each level from L2-3 through L5-S1. There is chronic loss of disc height and endplate osteophytes at L5-S1. No acute fracture is evident. IMPRESSION: 1. No evidence for acute trauma. 2. Degenerative changes throughout the thoracolumbar spine. 3. Evidence of prior granulomatous disease with calcified left hilar lymph nodes, granulomata in the lingula, and calcifications in the spleen. 4. Extensive sigmoid colonic diverticulosis without evidence for diverticulitis. Electronically Signed   By: San Morelle M.D.   On: 10/05/2014 16:11   Dg Pelvis Portable  10/05/2014  CLINICAL DATA:  Fall. EXAM: PORTABLE PELVIS 1-2 VIEWS COMPARISON:  None FINDINGS: There is mild bilateral hip osteoarthritis. There is no evidence for acute fracture or subluxation. Degenerative disc disease noted within the lumbar spine. IMPRESSION: 1. No acute findings. 2. 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 Electronically Signed   By: Kerby Moors M.D.   On: 10/05/2014 15:05   Dg Chest Port 1 View  10/05/2014  CLINICAL DATA:  Fall. EXAM: PORTABLE CHEST - 1 VIEW COMPARISON:  05/21/2014 FINDINGS: Left-sided 3 lead pacemaker remains in place. Cardiac silhouette is likely within normal limits for AP technique. There is slight elevation of the right hemidiaphragm. The lungs are clear. No pleural effusion or pneumothorax is seen. No acute osseous abnormality is identified. IMPRESSION: No active disease. Electronically Signed   By: Logan Bores M.D.   On: 10/05/2014 15:03   Dg Knee Complete 4 Views Left  10/05/2014  CLINICAL DATA:  Knee pain EXAM: LEFT KNEE - COMPLETE 4+ VIEW COMPARISON:  06/04/2012 FINDINGS: Total knee arthroplasty reidentified. No fracture line visualized. Vascular calcifications. No evidence for dislocation. Trace  suprapatellar fluid. IMPRESSION: Expected appearance after left total knee arthroplasty without focal acute finding. Electronically Signed   By: Conchita Paris M.D.   On: 10/05/2014 16:01   Dg Hand Complete Right  10/05/2014  CLINICAL DATA:  Entering teen alcohol last night and fell down steps. Bruising and hematoma to right forehead and night. EXAM: RIGHT HAND - COMPLETE 3+ VIEW COMPARISON:  None. FINDINGS: There is no evidence of fracture or dislocation. There is no evidence of arthropathy or other focal bone abnormality. Soft tissues are unremarkable. IMPRESSION: Negative. Electronically Signed   By: Kerby Moors M.D.   On: 10/05/2014 16:03   Ct Maxillofacial Wo Cm  10/05/2014  CLINICAL DATA:  Fall.  Head trauma.  Fall down stairs. EXAM: CT HEAD WITHOUT CONTRAST CT MAXILLOFACIAL WITHOUT CONTRAST CT CERVICAL SPINE WITHOUT CONTRAST TECHNIQUE: Multidetector CT imaging of the head, cervical spine, and maxillofacial structures were performed using the standard protocol without intravenous contrast. Multiplanar CT image reconstructions of the cervical spine and maxillofacial structures were also generated. COMPARISON:  01/28/2014. FINDINGS: CT HEAD FINDINGS No mass lesion, mass effect, midline shift, hydrocephalus, hemorrhage. No acute territorial cortical ischemia/infarct. Atrophy and chronic ischemic white matter disease is present. Chronic LEFT MCA infarct. Mastoid air cells and middle ears appear clear. Calvarium intact. Intracranial atherosclerosis. CT MAXILLOFACIAL FINDINGS Globes: Intact Bony orbits:  Intact Pterygoid plates: Intact Mandibular condyles:  Located Mandible: Intact.  Torus mandibularis is present.  Intracranial contents:  See above. Paranasal sinuses: Mucosal thickening is present in the posterior RIGHT maxillary sinus. LEFT maxillary sinus appears normal. Frontal sinuses are within normal limits. Mastoid air cells: Opacification of a few inferior RIGHT mastoid air cells. LEFT mastoid air  cells clear. Nasal bones: Intact Soft tissues: Normal Visible cervical spine: See below. CT CERVICAL SPINE FINDINGS Alignment: Straightening of the normal cervical lordosis. 2 mm anterolisthesis of C7 on T1 appears degenerative and facet mediated. Dextroconvex torticollis. Craniocervical junction: The odontoid is intact. Occipital condyles intact. Normal C1 ring. Vertebrae: Negative for fracture or aggressive osseous lesions. Paraspinal soft tissues: Carotid atherosclerosis. 4 mm low-density lesion in the LEFT thyroid lobe does not merit further evaluation. Lung apices: Grossly normal. Severe cervical spine degenerative disease is present. This involves both discs and facets. There is ankylosis at C3-C4 with ossification of the disc. The fusion is solid. Bilateral severe facet arthrosis is present with multilevel foraminal stenosis due to a combination of uncovertebral spurring and facet spurring. Disc osteophyte complexes are most pronounced at C5-C6 and C6-C7 producing mild central stenosis. IMPRESSION: 1. No acute intracranial abnormality. Atrophy, chronic ischemic white matter disease and old LEFT MCA infarct. 2. Negative for facial fracture. 3. Moderate to severe cervical spine degenerative disease. No cervical spine fracture or dislocation. Electronically Signed   By: Dereck Ligas M.D.   On: 10/05/2014 16:11    Assessment & Plan:   Zamier was seen today for hypertension and hyperlipidemia.  Diagnoses and all orders for this visit:  Atherosclerosis of native coronary artery without angina pectoris, unspecified whether native or transplanted heart- he has not had any angina recently, will cont with rick factor modifications -     Lipid panel; Future  Essential hypertension- his BP is well controlled, lytes and renal function are stable -     Basic metabolic panel; Future  Hyperglycemia- improvement noted, this does not need to be treated -     Basic metabolic panel; Future -     Cancel:  Hemoglobin A1c; Future  Hyperlipidemia with target LDL less than 100- he has achieved his LDL goal and is doing well on the statin -     TSH; Future -     Lipid panel; Future   I am having Mr. Bomer maintain his mometasone, polyethylene glycol powder, albuterol, cyanocobalamin, metoprolol succinate, NONFORMULARY OR COMPOUNDED ITEM, rivastigmine, amiodarone, pantoprazole, clonazePAM, amiodarone, folic acid, 0000000, rosuvastatin, spironolactone, warfarin, and ramipril.  No orders of the defined types were placed in this encounter.     Follow-up: Return in about 6 months (around 06/16/2015).  Scarlette Calico, MD

## 2014-12-17 NOTE — Telephone Encounter (Signed)
Spoke with patient.

## 2014-12-17 NOTE — Patient Instructions (Signed)
Hypertension Hypertension, commonly called high blood pressure, is when the force of blood pumping through your arteries is too strong. Your arteries are the blood vessels that carry blood from your heart throughout your body. A blood pressure reading consists of a higher number over a lower number, such as 110/72. The higher number (systolic) is the pressure inside your arteries when your heart pumps. The lower number (diastolic) is the pressure inside your arteries when your heart relaxes. Ideally you want your blood pressure below 120/80. Hypertension forces your heart to work harder to pump blood. Your arteries may become narrow or stiff. Having untreated or uncontrolled hypertension can cause heart attack, stroke, kidney disease, and other problems. RISK FACTORS Some risk factors for high blood pressure are controllable. Others are not.  Risk factors you cannot control include:   Race. You may be at higher risk if you are African American.  Age. Risk increases with age.  Gender. Men are at higher risk than women before age 45 years. After age 65, women are at higher risk than men. Risk factors you can control include:  Not getting enough exercise or physical activity.  Being overweight.  Getting too much fat, sugar, calories, or salt in your diet.  Drinking too much alcohol. SIGNS AND SYMPTOMS Hypertension does not usually cause signs or symptoms. Extremely high blood pressure (hypertensive crisis) may cause headache, anxiety, shortness of breath, and nosebleed. DIAGNOSIS To check if you have hypertension, your health care provider will measure your blood pressure while you are seated, with your arm held at the level of your heart. It should be measured at least twice using the same arm. Certain conditions can cause a difference in blood pressure between your right and left arms. A blood pressure reading that is higher than normal on one occasion does not mean that you need treatment. If  it is not clear whether you have high blood pressure, you may be asked to return on a different day to have your blood pressure checked again. Or, you may be asked to monitor your blood pressure at home for 1 or more weeks. TREATMENT Treating high blood pressure includes making lifestyle changes and possibly taking medicine. Living a healthy lifestyle can help lower high blood pressure. You may need to change some of your habits. Lifestyle changes may include:  Following the DASH diet. This diet is high in fruits, vegetables, and whole grains. It is low in salt, red meat, and added sugars.  Keep your sodium intake below 2,300 mg per day.  Getting at least 30-45 minutes of aerobic exercise at least 4 times per week.  Losing weight if necessary.  Not smoking.  Limiting alcoholic beverages.  Learning ways to reduce stress. Your health care provider may prescribe medicine if lifestyle changes are not enough to get your blood pressure under control, and if one of the following is true:  You are 18-59 years of age and your systolic blood pressure is above 140.  You are 60 years of age or older, and your systolic blood pressure is above 150.  Your diastolic blood pressure is above 90.  You have diabetes, and your systolic blood pressure is over 140 or your diastolic blood pressure is over 90.  You have kidney disease and your blood pressure is above 140/90.  You have heart disease and your blood pressure is above 140/90. Your personal target blood pressure may vary depending on your medical conditions, your age, and other factors. HOME CARE INSTRUCTIONS    Have your blood pressure rechecked as directed by your health care provider.   Take medicines only as directed by your health care provider. Follow the directions carefully. Blood pressure medicines must be taken as prescribed. The medicine does not work as well when you skip doses. Skipping doses also puts you at risk for  problems.  Do not smoke.   Monitor your blood pressure at home as directed by your health care provider. SEEK MEDICAL CARE IF:   You think you are having a reaction to medicines taken.  You have recurrent headaches or feel dizzy.  You have swelling in your ankles.  You have trouble with your vision. SEEK IMMEDIATE MEDICAL CARE IF:  You develop a severe headache or confusion.  You have unusual weakness, numbness, or feel faint.  You have severe chest or abdominal pain.  You vomit repeatedly.  You have trouble breathing. MAKE SURE YOU:   Understand these instructions.  Will watch your condition.  Will get help right away if you are not doing well or get worse.   This information is not intended to replace advice given to you by your health care provider. Make sure you discuss any questions you have with your health care provider.   Document Released: 01/10/2005 Document Revised: 05/27/2014 Document Reviewed: 11/02/2012 Elsevier Interactive Patient Education 2016 [employer]  

## 2014-12-22 ENCOUNTER — Other Ambulatory Visit: Payer: Self-pay | Admitting: Neurology

## 2014-12-22 NOTE — Telephone Encounter (Signed)
Rx sent 

## 2014-12-25 ENCOUNTER — Telehealth: Payer: Self-pay

## 2014-12-25 ENCOUNTER — Ambulatory Visit (INDEPENDENT_AMBULATORY_CARE_PROVIDER_SITE_OTHER): Payer: Medicare Other

## 2014-12-25 DIAGNOSIS — J309 Allergic rhinitis, unspecified: Secondary | ICD-10-CM

## 2014-12-25 NOTE — Telephone Encounter (Signed)
ATC PT NA, no VM. WCB

## 2014-12-25 NOTE — Telephone Encounter (Signed)
Patient came in for his allergy injection and also wanted a Pna vacc. Pt states that his PCP advised him to get a Pna "booster". Pt just had Pna23 last year. Please advise if patient needs another Pneumovax23 or Prevnar13. Thanks!

## 2014-12-25 NOTE — Telephone Encounter (Signed)
He can have Prevnar 13      This is a one-time vaccination. Won't need repeat

## 2014-12-26 NOTE — Telephone Encounter (Signed)
Patient's wife notified of Dr. Janee Morn recommendations. Nothing further needed. Closing encounter

## 2015-01-01 ENCOUNTER — Ambulatory Visit: Payer: Medicare Other | Admitting: Internal Medicine

## 2015-01-01 DIAGNOSIS — Z23 Encounter for immunization: Secondary | ICD-10-CM

## 2015-01-03 ENCOUNTER — Other Ambulatory Visit: Payer: Self-pay | Admitting: Internal Medicine

## 2015-01-05 MED ORDER — PNEUMOCOCCAL 13-VAL CONJ VACC IM SUSP
0.5000 mL | Freq: Once | INTRAMUSCULAR | Status: AC
  Administered 2015-01-01: 0.5 mL via INTRAMUSCULAR

## 2015-01-08 ENCOUNTER — Ambulatory Visit (INDEPENDENT_AMBULATORY_CARE_PROVIDER_SITE_OTHER): Payer: Medicare Other

## 2015-01-08 DIAGNOSIS — J309 Allergic rhinitis, unspecified: Secondary | ICD-10-CM

## 2015-01-14 ENCOUNTER — Other Ambulatory Visit: Payer: Self-pay | Admitting: Internal Medicine

## 2015-01-15 ENCOUNTER — Ambulatory Visit (INDEPENDENT_AMBULATORY_CARE_PROVIDER_SITE_OTHER): Payer: Medicare Other

## 2015-01-15 DIAGNOSIS — J309 Allergic rhinitis, unspecified: Secondary | ICD-10-CM | POA: Diagnosis not present

## 2015-01-16 ENCOUNTER — Telehealth: Payer: Self-pay | Admitting: Internal Medicine

## 2015-01-16 ENCOUNTER — Ambulatory Visit (INDEPENDENT_AMBULATORY_CARE_PROVIDER_SITE_OTHER): Payer: Medicare Other

## 2015-01-16 DIAGNOSIS — J309 Allergic rhinitis, unspecified: Secondary | ICD-10-CM | POA: Diagnosis not present

## 2015-01-16 NOTE — Telephone Encounter (Signed)
Allergy Serum Extract Date Mixed: 01/16/15 Vial: 1 Strength: 1:10 Here/Mail/Pick Up: here Mixed By: tbs Last OV: 05/21/14 Pending OV: n/a

## 2015-01-20 ENCOUNTER — Ambulatory Visit (INDEPENDENT_AMBULATORY_CARE_PROVIDER_SITE_OTHER): Payer: Medicare Other

## 2015-01-20 ENCOUNTER — Telehealth: Payer: Self-pay | Admitting: Cardiology

## 2015-01-20 DIAGNOSIS — I5022 Chronic systolic (congestive) heart failure: Secondary | ICD-10-CM

## 2015-01-20 DIAGNOSIS — Z95810 Presence of automatic (implantable) cardiac defibrillator: Secondary | ICD-10-CM

## 2015-01-20 NOTE — Telephone Encounter (Signed)
LMOVM reminding pt to send remote transmission.   

## 2015-01-21 ENCOUNTER — Telehealth: Payer: Self-pay | Admitting: Internal Medicine

## 2015-01-21 DIAGNOSIS — D294 Benign neoplasm of scrotum: Secondary | ICD-10-CM | POA: Diagnosis not present

## 2015-01-21 NOTE — Telephone Encounter (Signed)
°  1. Has your device fired? no  2. Is you device beeping? no  3. Are you experiencing draining or swelling at device site? no  4. Are you calling to see if we received your device transmission? no  5. Have you passed out? No   Pt want to know if he need to sent remote transmission or are you going to do it. I informed pt that he was suppose to sent transmission and read him message that was sent to him, but he still wanted to talk to device.

## 2015-01-21 NOTE — Telephone Encounter (Signed)
Device clinic heart failure nurse called pt to go over results of remote transmission.

## 2015-01-21 NOTE — Progress Notes (Signed)
EPIC Encounter for ICM Monitoring  Patient Name: Corey [name] is a 79 y.o. male Date: 01/21/2015 Primary Care Physican: Scarlette Calico, MD Primary Cardiologist: Allred Electrophysiologist: Allred Dry Weight: 196 lb       In the past month, have you:  1. Gained more than 2 pounds in a day or more than 5 pounds in a week? no  2. Had changes in your medications (with verification of current medications)? no  3. Had more shortness of breath than is usual for you? no  4. Limited your activity because of shortness of breath? no  5. Not been able to sleep because of shortness of breath? no  6. Had increased swelling in your feet or ankles? no  7. Had symptoms of dehydration (dizziness, dry mouth, increased thirst, decreased urine output) no  8. Had changes in sodium restriction? no  9. Been compliant with medication? Yes   ICM trend: 01/20/2015   Follow-up plan: ICM clinic phone appointment on 02/23/2015.  [place] daily impedance trending along baseline.  Patient denied any HF symptoms but does have a cold.  He traveled to [place] over the holiday to see children and grandchildren.   No changes today.   Copy of note sent to patient's primary care physician, primary cardiologist, and device following physician.  Rosalene Billings, RN, CCM 01/21/2015 3:51 PM

## 2015-01-22 ENCOUNTER — Telehealth: Payer: Self-pay | Admitting: Internal Medicine

## 2015-01-22 ENCOUNTER — Ambulatory Visit (INDEPENDENT_AMBULATORY_CARE_PROVIDER_SITE_OTHER): Payer: Medicare Other

## 2015-01-22 DIAGNOSIS — J309 Allergic rhinitis, unspecified: Secondary | ICD-10-CM | POA: Diagnosis not present

## 2015-01-22 NOTE — Telephone Encounter (Signed)
LMTCB x1 for pt. Last OV 04/2014 reports to f/u in 1 year

## 2015-01-27 NOTE — Telephone Encounter (Signed)
Left message for patient to call back  

## 2015-01-28 NOTE — Telephone Encounter (Signed)
LMTCB

## 2015-01-29 ENCOUNTER — Ambulatory Visit: Payer: Medicare Other

## 2015-01-29 NOTE — Telephone Encounter (Signed)
lmtcb x3 for pt. 

## 2015-02-05 ENCOUNTER — Ambulatory Visit (INDEPENDENT_AMBULATORY_CARE_PROVIDER_SITE_OTHER): Payer: Medicare Other

## 2015-02-05 DIAGNOSIS — J309 Allergic rhinitis, unspecified: Secondary | ICD-10-CM

## 2015-02-12 ENCOUNTER — Ambulatory Visit: Payer: Medicare Other

## 2015-02-23 ENCOUNTER — Telehealth: Payer: Self-pay | Admitting: Cardiology

## 2015-02-23 NOTE — Telephone Encounter (Signed)
LMOVM reminding pt to send remote transmission.   

## 2015-02-24 DIAGNOSIS — Z87891 Personal history of nicotine dependence: Secondary | ICD-10-CM | POA: Diagnosis not present

## 2015-02-24 DIAGNOSIS — K219 Gastro-esophageal reflux disease without esophagitis: Secondary | ICD-10-CM | POA: Diagnosis not present

## 2015-02-24 DIAGNOSIS — Z885 Allergy status to narcotic agent status: Secondary | ICD-10-CM | POA: Diagnosis not present

## 2015-02-24 DIAGNOSIS — I10 Essential (primary) hypertension: Secondary | ICD-10-CM | POA: Diagnosis not present

## 2015-02-25 ENCOUNTER — Encounter: Payer: Self-pay | Admitting: Neurology

## 2015-02-26 DIAGNOSIS — R160 Hepatomegaly, not elsewhere classified: Secondary | ICD-10-CM | POA: Diagnosis not present

## 2015-02-26 DIAGNOSIS — R932 Abnormal findings on diagnostic imaging of liver and biliary tract: Secondary | ICD-10-CM | POA: Diagnosis not present

## 2015-02-26 DIAGNOSIS — R935 Abnormal findings on diagnostic imaging of other abdominal regions, including retroperitoneum: Secondary | ICD-10-CM | POA: Diagnosis not present

## 2015-02-27 NOTE — Progress Notes (Signed)
No remote ICM transmission received on scheduled date 02/22/2014.  Rescheduled for 03/16/2015 and letter sent with new date.

## 2015-02-28 ENCOUNTER — Other Ambulatory Visit: Payer: Self-pay | Admitting: Internal Medicine

## 2015-03-02 ENCOUNTER — Encounter: Payer: Self-pay | Admitting: Internal Medicine

## 2015-03-02 ENCOUNTER — Ambulatory Visit (INDEPENDENT_AMBULATORY_CARE_PROVIDER_SITE_OTHER): Payer: Medicare Other | Admitting: Internal Medicine

## 2015-03-02 ENCOUNTER — Telehealth: Payer: Self-pay | Admitting: Internal Medicine

## 2015-03-02 VITALS — BP 120/70 | HR 68 | Temp 97.8°F | Resp 16 | Ht 71.25 in | Wt 201.4 lb

## 2015-03-02 DIAGNOSIS — R10811 Right upper quadrant abdominal tenderness: Secondary | ICD-10-CM | POA: Diagnosis not present

## 2015-03-02 DIAGNOSIS — K828 Other specified diseases of gallbladder: Secondary | ICD-10-CM | POA: Insufficient documentation

## 2015-03-02 NOTE — Progress Notes (Signed)
Pre visit review using our clinic review tool, if applicable. No additional management support is needed unless otherwise documented below in the visit note. 

## 2015-03-02 NOTE — Telephone Encounter (Signed)
New Message:  Pt called in stating that he made a mistake with taking 7 of his medicaitons and he would like some help. Please Corey [name]

## 2015-03-02 NOTE — Telephone Encounter (Signed)
Spoke with patient.  He missed his morning medications.  He is going to take them now and will take his pm doses later this pm

## 2015-03-03 DIAGNOSIS — R10811 Right upper quadrant abdominal tenderness: Secondary | ICD-10-CM | POA: Insufficient documentation

## 2015-03-03 NOTE — Progress Notes (Signed)
Subjective:  Patient ID: Corey [name], male    DOB: [birth year]  Age: 80 y.o. MRN: [id number]  CC: Abdominal Pain   HPI Corey [name] presents for a one-week history of right upper quadrant abdominal pain. He was in [place] when the pain developed and he was seen at the [facility]. He tells me that his labs were normal. He had an ultrasound performed which I have been able to obtain a copy of - it does show significant amount of gallbladder sludge but no stones. I do not have any additional information from his visit in [place]. Fortunately, he tells me that the pain is improving. He describes it as an intermittent achy sensation in his right upper quadrant with belching. He denies nausea, vomiting, fever, or chills. He has had a slight decrease in his appetite.  Outpatient Prescriptions Prior to Visit  Medication Sig Dispense Refill  . albuterol (PROVENTIL HFA;VENTOLIN HFA) 108 (90 BASE) MCG/ACT inhaler Inhale 1-2 puffs into the lungs every 6 (six) hours as needed for shortness of breath. 1 Inhaler 11  . amiodarone (PACERONE) 200 MG tablet Take 0.5 tablets (100 mg total) by mouth daily. 45 tablet 0  . clonazePAM (KLONOPIN) 0.5 MG tablet TAKE 1/2 TO 2 TABLETS AS NEEDED FOR SLEEP 30 tablet 2  . Cyanocobalamin (B-12) 100 MCG TABS Take by mouth.    . folic acid (FOLVITE) 1 MG tablet TAKE ONE TABLET EACH DAY 90 tablet 3  . metoprolol succinate (TOPROL-XL) 50 MG 24 hr tablet TAKE 1/2 TABLET AT NIGHT 30 tablet 5  . mometasone (NASONEX) 50 MCG/ACT nasal spray TWO SPRAYS IN EACH NOSTRIL DAILY AS NEEDED FOR ALLERGIES    . NONFORMULARY OR COMPOUNDED ITEM Allergy Vaccine 1:10 Given at [facility]    . pantoprazole (PROTONIX) 40 MG tablet TAKE ONE TABLET BY MOUTH ONCE DAILY 30 tablet 3  . polyethylene glycol powder (GLYCOLAX/MIRALAX) powder Take 17 g by mouth daily as needed (constipation).     . ramipril (ALTACE) 10 MG capsule TAKE TWO CAPSULES EVERY DAY 60 capsule 5  . rivastigmine  (EXELON) 3 MG capsule Take 1 capsule (3 mg total) by mouth 2 (two) times daily. 60 capsule 11  . rosuvastatin (CRESTOR) 5 MG tablet TAKE ONE TABLET IN THE EVENING 30 tablet 11  . spironolactone (ALDACTONE) 25 MG tablet Take 1 tablet (25 mg total) by mouth daily. 30 tablet 3  . warfarin (COUMADIN) 2.5 MG tablet TAKE AS DIRECTED BY COUMADIN CLINIC 120 tablet 1  . amiodarone (PACERONE) 200 MG tablet TAKE ONE-HALF TABLET DAILY 45 tablet 0  . cyanocobalamin (,VITAMIN B-12,) 1000 MCG/ML injection Inject 1000 mcg/ml weekly x 4 then monthly 4 mL 6  . rivastigmine (EXELON) 1.5 MG capsule TAKE ONE CAPSULE TWICE A DAY 120 capsule 3  . clonazePAM (KLONOPIN) 0.5 MG tablet TAKE 1/2 TO 2 TABLETS AS NEEDED FOR SLEEP 30 tablet 3   No facility-administered medications prior to visit.    ROS Review of Systems  Constitutional: Positive for appetite change. Negative for fever, chills, diaphoresis, activity change and fatigue.  HENT: Negative.   Eyes: Negative.   Respiratory: Negative.  Negative for cough, choking, chest tightness, shortness of breath and stridor.   Cardiovascular: Negative.  Negative for chest pain, palpitations and leg swelling.  Gastrointestinal: Positive for abdominal pain. Negative for nausea, vomiting, diarrhea, constipation, blood in stool and abdominal distention.  Endocrine: Negative.   Genitourinary: Negative.   Musculoskeletal: Negative.  Negative for myalgias, back pain and joint  swelling.  Skin: Negative for color change, pallor and rash.  Allergic/Immunologic: Negative.   Neurological: Negative.  Negative for dizziness, weakness and light-headedness.  Hematological: Negative.  Negative for adenopathy. Does not bruise/bleed easily.  Psychiatric/Behavioral: Negative.     Objective:  BP 120/70 mmHg  Pulse 68  Temp(Src) 97.8 F (36.6 C) (Oral)  Resp 16  Ht 5' 11.25" (1.81 m)  Wt 201 lb 7 oz (91.371 kg)  BMI 27.89 kg/m2  SpO2 94%  BP Readings from Last 3 Encounters:    03/02/15 120/70  12/17/14 120/88  11/18/14 140/70    Wt Readings from Last 3 Encounters:  03/02/15 201 lb 7 oz (91.371 kg)  12/17/14 200 lb (90.719 kg)  11/18/14 206 lb (93.441 kg)    Physical Exam  Constitutional: He is oriented to person, place, and time. He appears well-developed and well-nourished.  Non-toxic appearance. He does not have a sickly appearance. He does not appear ill. No distress.  HENT:  Head: Normocephalic and atraumatic.  Mouth/Throat: Oropharynx is clear and moist. No oropharyngeal exudate.  Eyes: Conjunctivae are normal. Right eye exhibits no discharge. Left eye exhibits no discharge. No scleral icterus.  Neck: Normal range of motion. Neck supple. No JVD present. No tracheal deviation present. No thyromegaly present.  Cardiovascular: Normal rate, regular rhythm, normal heart sounds and intact distal pulses.  Exam reveals no gallop and no friction rub.   No murmur heard. Pulmonary/Chest: Effort normal and breath sounds normal. No stridor. No respiratory distress. He has no wheezes. He has no rales. He exhibits no tenderness.  Abdominal: Soft. Normal appearance and bowel sounds are normal. He exhibits no distension and no mass. There is no hepatosplenomegaly, splenomegaly or hepatomegaly. There is tenderness in the right upper quadrant. There is no rebound, no guarding and no CVA tenderness.  Musculoskeletal: Normal range of motion. He exhibits no edema or tenderness.  Lymphadenopathy:    He has no cervical adenopathy.  Neurological: He is oriented to person, place, and time.  Skin: Skin is warm and dry. No rash noted. He is not diaphoretic. No erythema. No pallor.  Vitals reviewed.   Lab Results  Component Value Date   WBC 3.5* 11/18/2014   HGB 13.4 11/18/2014   HCT 39.3 11/18/2014   PLT 189.0 11/18/2014   GLUCOSE 128* 12/17/2014   CHOL 140 12/17/2014   TRIG 128.0 12/17/2014   HDL 46.40 12/17/2014   LDLCALC 68 12/17/2014   ALT 22 11/10/2014   AST 28  11/10/2014   NA 138 12/17/2014   K 4.8 12/17/2014   CL 103 12/17/2014   CREATININE 1.22 12/17/2014   BUN 19 12/17/2014   CO2 27 12/17/2014   TSH 1.36 12/17/2014   PSA 0.02* 11/13/2013   INR 2.5 12/15/2014   HGBA1C 5.5 11/18/2014    Dg Wrist Complete Right  10/05/2014  CLINICAL DATA:  Fall. EXAM: RIGHT WRIST - COMPLETE 3+ VIEW COMPARISON:  03/30/2010 FINDINGS: There is no evidence of fracture or dislocation. There is no evidence of arthropathy or other focal bone abnormality. Soft tissues are unremarkable. IMPRESSION: Negative. Electronically Signed   By: Kerby Moors M.D.   On: 10/05/2014 16:04   Ct Head Wo Contrast  10/05/2014  CLINICAL DATA:  Fall.  Head trauma.  Fall down stairs. EXAM: CT HEAD WITHOUT CONTRAST CT MAXILLOFACIAL WITHOUT CONTRAST CT CERVICAL SPINE WITHOUT CONTRAST TECHNIQUE: Multidetector CT imaging of the head, cervical spine, and maxillofacial structures were performed using the standard protocol without intravenous contrast. Multiplanar CT image  reconstructions of the cervical spine and maxillofacial structures were also generated. COMPARISON:  01/28/2014. FINDINGS: CT HEAD FINDINGS No mass lesion, mass effect, midline shift, hydrocephalus, hemorrhage. No acute territorial cortical ischemia/infarct. Atrophy and chronic ischemic white matter disease is present. Chronic LEFT MCA infarct. Mastoid air cells and middle ears appear clear. Calvarium intact. Intracranial atherosclerosis. CT MAXILLOFACIAL FINDINGS Globes: Intact Bony orbits:  Intact Pterygoid plates: Intact Mandibular condyles:  Located Mandible: Intact.  Torus mandibularis is present. Intracranial contents:  See above. Paranasal sinuses: Mucosal thickening is present in the posterior RIGHT maxillary sinus. LEFT maxillary sinus appears normal. Frontal sinuses are within normal limits. Mastoid air cells: Opacification of a few inferior RIGHT mastoid air cells. LEFT mastoid air cells clear. Nasal bones: Intact Soft  tissues: Normal Visible cervical spine: See below. CT CERVICAL SPINE FINDINGS Alignment: Straightening of the normal cervical lordosis. 2 mm anterolisthesis of C7 on T1 appears degenerative and facet mediated. Dextroconvex torticollis. Craniocervical junction: The odontoid is intact. Occipital condyles intact. Normal C1 ring. Vertebrae: Negative for fracture or aggressive osseous lesions. Paraspinal soft tissues: Carotid atherosclerosis. 4 mm low-density lesion in the LEFT thyroid lobe does not merit further evaluation. Lung apices: Grossly normal. Severe cervical spine degenerative disease is present. This involves both discs and facets. There is ankylosis at C3-C4 with ossification of the disc. The fusion is solid. Bilateral severe facet arthrosis is present with multilevel foraminal stenosis due to a combination of uncovertebral spurring and facet spurring. Disc osteophyte complexes are most pronounced at C5-C6 and C6-C7 producing mild central stenosis. IMPRESSION: 1. No acute intracranial abnormality. Atrophy, chronic ischemic white matter disease and old LEFT MCA infarct. 2. Negative for facial fracture. 3. Moderate to severe cervical spine degenerative disease. No cervical spine fracture or dislocation. Electronically Signed   By: Dereck Ligas M.D.   On: 10/05/2014 16:11   Ct Chest W Contrast  10/05/2014  CLINICAL DATA:  Fall down 15 steps last evening. Back pain. Prostate cancer. Dementia. Initial encounter. EXAM: CT CHEST, ABDOMEN, AND PELVIS WITH CONTRAST TECHNIQUE: Multidetector CT imaging of the chest, abdomen and pelvis was performed following the standard protocol during bolus administration of intravenous contrast. CONTRAST:  128mL OMNIPAQUE IOHEXOL 300 MG/ML  SOLN COMPARISON:  One-view chest x-ray from the same day. CTA chest 03/30/2010. CT abdomen and pelvis 12/26/2013. FINDINGS: CT CHEST FINDINGS The heart size is normal. Pacing wires are in place. Coronary artery calcifications are present.  Atherosclerotic calcifications are present at the aortic arch. Calcified lymph nodes are present in the left hilum. No other significant mediastinal or axillary nodes are present. A subcentimeter left thyroid nodule is present. The thoracic inlet is otherwise within normal limits. The esophagus is unremarkable. Mild dependent atelectasis is present bilaterally. Calcified nodules are noted in the lingula. The combination suggests a history of granulomatous disease. No active disease is present. Mild degenerative changes are present in the thoracic spine. No acute fracture or traumatic subluxation is evident. The ribs are intact. CT ABDOMEN AND PELVIS FINDINGS The liver is within normal limits. Calcifications in the spleen are compatible with prior granulomatous disease. The stomach, duodenum, and pancreas are unremarkable. The common bile duct and gallbladder are normal. The adrenal glands are normal bilaterally. A 7 mm cyst is noted near the upper pole of the right kidney. Kidneys are otherwise within normal limits bilaterally. Extensive diverticular changes are present throughout the sigmoid colon. There is no focal inflammation suggest diverticulitis. The more proximal colon is unremarkable. The appendix is visualized and within  normal limits. Small bowel is normal. No significant adenopathy or free fluid is present. Fat is herniated into the left inguinal canal without associated bowel. Atherosclerotic changes are noted in the abdominal aorta without aneurysm. Degenerative changes are noted in the lower lumbar spine. A vacuum disc is present at each level from L2-3 through L5-S1. There is chronic loss of disc height and endplate osteophytes at L5-S1. No acute fracture is evident. IMPRESSION: 1. No evidence for acute trauma. 2. Degenerative changes throughout the thoracolumbar spine. 3. Evidence of prior granulomatous disease with calcified left hilar lymph nodes, granulomata in the lingula, and calcifications in  the spleen. 4. Extensive sigmoid colonic diverticulosis without evidence for diverticulitis. Electronically Signed   By: San Morelle M.D.   On: 10/05/2014 16:11   Ct Cervical Spine Wo Contrast  10/05/2014  CLINICAL DATA:  Fall.  Head trauma.  Fall down stairs. EXAM: CT HEAD WITHOUT CONTRAST CT MAXILLOFACIAL WITHOUT CONTRAST CT CERVICAL SPINE WITHOUT CONTRAST TECHNIQUE: Multidetector CT imaging of the head, cervical spine, and maxillofacial structures were performed using the standard protocol without intravenous contrast. Multiplanar CT image reconstructions of the cervical spine and maxillofacial structures were also generated. COMPARISON:  01/28/2014. FINDINGS: CT HEAD FINDINGS No mass lesion, mass effect, midline shift, hydrocephalus, hemorrhage. No acute territorial cortical ischemia/infarct. Atrophy and chronic ischemic white matter disease is present. Chronic LEFT MCA infarct. Mastoid air cells and middle ears appear clear. Calvarium intact. Intracranial atherosclerosis. CT MAXILLOFACIAL FINDINGS Globes: Intact Bony orbits:  Intact Pterygoid plates: Intact Mandibular condyles:  Located Mandible: Intact.  Torus mandibularis is present. Intracranial contents:  See above. Paranasal sinuses: Mucosal thickening is present in the posterior RIGHT maxillary sinus. LEFT maxillary sinus appears normal. Frontal sinuses are within normal limits. Mastoid air cells: Opacification of a few inferior RIGHT mastoid air cells. LEFT mastoid air cells clear. Nasal bones: Intact Soft tissues: Normal Visible cervical spine: See below. CT CERVICAL SPINE FINDINGS Alignment: Straightening of the normal cervical lordosis. 2 mm anterolisthesis of C7 on T1 appears degenerative and facet mediated. Dextroconvex torticollis. Craniocervical junction: The odontoid is intact. Occipital condyles intact. Normal C1 ring. Vertebrae: Negative for fracture or aggressive osseous lesions. Paraspinal soft tissues: Carotid atherosclerosis. 4  mm low-density lesion in the LEFT thyroid lobe does not merit further evaluation. Lung apices: Grossly normal. Severe cervical spine degenerative disease is present. This involves both discs and facets. There is ankylosis at C3-C4 with ossification of the disc. The fusion is solid. Bilateral severe facet arthrosis is present with multilevel foraminal stenosis due to a combination of uncovertebral spurring and facet spurring. Disc osteophyte complexes are most pronounced at C5-C6 and C6-C7 producing mild central stenosis. IMPRESSION: 1. No acute intracranial abnormality. Atrophy, chronic ischemic white matter disease and old LEFT MCA infarct. 2. Negative for facial fracture. 3. Moderate to severe cervical spine degenerative disease. No cervical spine fracture or dislocation. Electronically Signed   By: Dereck Ligas M.D.   On: 10/05/2014 16:11   Ct Abdomen Pelvis W Contrast  10/05/2014  CLINICAL DATA:  Fall down 15 steps last evening. Back pain. Prostate cancer. Dementia. Initial encounter. EXAM: CT CHEST, ABDOMEN, AND PELVIS WITH CONTRAST TECHNIQUE: Multidetector CT imaging of the chest, abdomen and pelvis was performed following the standard protocol during bolus administration of intravenous contrast. CONTRAST:  169mL OMNIPAQUE IOHEXOL 300 MG/ML  SOLN COMPARISON:  One-view chest x-ray from the same day. CTA chest 03/30/2010. CT abdomen and pelvis 12/26/2013. FINDINGS: CT CHEST FINDINGS The heart size is normal. Pacing wires  are in place. Coronary artery calcifications are present. Atherosclerotic calcifications are present at the aortic arch. Calcified lymph nodes are present in the left hilum. No other significant mediastinal or axillary nodes are present. A subcentimeter left thyroid nodule is present. The thoracic inlet is otherwise within normal limits. The esophagus is unremarkable. Mild dependent atelectasis is present bilaterally. Calcified nodules are noted in the lingula. The combination suggests a  history of granulomatous disease. No active disease is present. Mild degenerative changes are present in the thoracic spine. No acute fracture or traumatic subluxation is evident. The ribs are intact. CT ABDOMEN AND PELVIS FINDINGS The liver is within normal limits. Calcifications in the spleen are compatible with prior granulomatous disease. The stomach, duodenum, and pancreas are unremarkable. The common bile duct and gallbladder are normal. The adrenal glands are normal bilaterally. A 7 mm cyst is noted near the upper pole of the right kidney. Kidneys are otherwise within normal limits bilaterally. Extensive diverticular changes are present throughout the sigmoid colon. There is no focal inflammation suggest diverticulitis. The more proximal colon is unremarkable. The appendix is visualized and within normal limits. Small bowel is normal. No significant adenopathy or free fluid is present. Fat is herniated into the left inguinal canal without associated bowel. Atherosclerotic changes are noted in the abdominal aorta without aneurysm. Degenerative changes are noted in the lower lumbar spine. A vacuum disc is present at each level from L2-3 through L5-S1. There is chronic loss of disc height and endplate osteophytes at L5-S1. No acute fracture is evident. IMPRESSION: 1. No evidence for acute trauma. 2. Degenerative changes throughout the thoracolumbar spine. 3. Evidence of prior granulomatous disease with calcified left hilar lymph nodes, granulomata in the lingula, and calcifications in the spleen. 4. Extensive sigmoid colonic diverticulosis without evidence for diverticulitis. Electronically Signed   By: San Morelle M.D.   On: 10/05/2014 16:11   Dg Pelvis Portable  10/05/2014  CLINICAL DATA:  Fall. EXAM: PORTABLE PELVIS 1-2 VIEWS COMPARISON:  None FINDINGS: There is mild bilateral hip osteoarthritis. There is no evidence for acute fracture or subluxation. Degenerative disc disease noted within the  lumbar spine. IMPRESSION: 1. No acute findings. 2. 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 Electronically Signed   By: Kerby Moors M.D.   On: 10/05/2014 15:05   Dg Chest Port 1 View  10/05/2014  CLINICAL DATA:  Fall. EXAM: PORTABLE CHEST - 1 VIEW COMPARISON:  05/21/2014 FINDINGS: Left-sided 3 lead pacemaker remains in place. Cardiac silhouette is likely within normal limits for AP technique. There is slight elevation of the right hemidiaphragm. The lungs are clear. No pleural effusion or pneumothorax is seen. No acute osseous abnormality is identified. IMPRESSION: No active disease. Electronically Signed   By: Logan Bores M.D.   On: 10/05/2014 15:03   Dg Knee Complete 4 Views Left  10/05/2014  CLINICAL DATA:  Knee pain EXAM: LEFT KNEE - COMPLETE 4+ VIEW COMPARISON:  06/04/2012 FINDINGS: Total knee arthroplasty reidentified. No fracture line visualized. Vascular calcifications. No evidence for dislocation. Trace suprapatellar fluid. IMPRESSION: Expected appearance after left total knee arthroplasty without focal acute finding. Electronically Signed   By: Conchita Paris M.D.   On: 10/05/2014 16:01   Dg Hand Complete Right  10/05/2014  CLINICAL DATA:  Entering teen alcohol last night and fell down steps. Bruising and hematoma to right forehead and night. EXAM: RIGHT HAND - COMPLETE 3+ VIEW COMPARISON:  None. FINDINGS: There is no evidence of fracture or dislocation. There is no  evidence of arthropathy or other focal bone abnormality. Soft tissues are unremarkable. IMPRESSION: Negative. Electronically Signed   By: Kerby Moors M.D.   On: 10/05/2014 16:03   Ct Maxillofacial Wo Cm  10/05/2014  CLINICAL DATA:  Fall.  Head trauma.  Fall down stairs. EXAM: CT HEAD WITHOUT CONTRAST CT MAXILLOFACIAL WITHOUT CONTRAST CT CERVICAL SPINE WITHOUT CONTRAST TECHNIQUE: Multidetector CT imaging of the head, cervical spine, and maxillofacial structures were performed using the standard protocol  without intravenous contrast. Multiplanar CT image reconstructions of the cervical spine and maxillofacial structures were also generated. COMPARISON:  01/28/2014. FINDINGS: CT HEAD FINDINGS No mass lesion, mass effect, midline shift, hydrocephalus, hemorrhage. No acute territorial cortical ischemia/infarct. Atrophy and chronic ischemic white matter disease is present. Chronic LEFT MCA infarct. Mastoid air cells and middle ears appear clear. Calvarium intact. Intracranial atherosclerosis. CT MAXILLOFACIAL FINDINGS Globes: Intact Bony orbits:  Intact Pterygoid plates: Intact Mandibular condyles:  Located Mandible: Intact.  Torus mandibularis is present. Intracranial contents:  See above. Paranasal sinuses: Mucosal thickening is present in the posterior RIGHT maxillary sinus. LEFT maxillary sinus appears normal. Frontal sinuses are within normal limits. Mastoid air cells: Opacification of a few inferior RIGHT mastoid air cells. LEFT mastoid air cells clear. Nasal bones: Intact Soft tissues: Normal Visible cervical spine: See below. CT CERVICAL SPINE FINDINGS Alignment: Straightening of the normal cervical lordosis. 2 mm anterolisthesis of C7 on T1 appears degenerative and facet mediated. Dextroconvex torticollis. Craniocervical junction: The odontoid is intact. Occipital condyles intact. Normal C1 ring. Vertebrae: Negative for fracture or aggressive osseous lesions. Paraspinal soft tissues: Carotid atherosclerosis. 4 mm low-density lesion in the LEFT thyroid lobe does not merit further evaluation. Lung apices: Grossly normal. Severe cervical spine degenerative disease is present. This involves both discs and facets. There is ankylosis at C3-C4 with ossification of the disc. The fusion is solid. Bilateral severe facet arthrosis is present with multilevel foraminal stenosis due to a combination of uncovertebral spurring and facet spurring. Disc osteophyte complexes are most pronounced at C5-C6 and C6-C7 producing mild  central stenosis. IMPRESSION: 1. No acute intracranial abnormality. Atrophy, chronic ischemic white matter disease and old LEFT MCA infarct. 2. Negative for facial fracture. 3. Moderate to severe cervical spine degenerative disease. No cervical spine fracture or dislocation. Electronically Signed   By: Dereck Ligas M.D.   On: 10/05/2014 16:11    Assessment & Plan:   Quamane was seen today for abdominal pain.  Diagnoses and all orders for this visit:  Gallbladder sludge- I have ordered a HIDA scan to see how severely this is impacting his gallbladder function, I've also asked him to see a general surgeon to consider having cholecystectomy. -     Ambulatory referral to General Surgery -     [place]; Future  Abdominal tenderness, RUQ (right upper quadrant)- he does not appear toxic today and his abdominal exam is benign, I do not see any evidence of cholecystitis, he has slowly improved over the last week, see above comments for evaluation of the gallbladder sludge. -     NM Hepato W/Eject Fract; Future   I have discontinued Mr. Karmel cyanocobalamin. I am also having him maintain his mometasone, polyethylene glycol powder, albuterol, metoprolol succinate, NONFORMULARY OR COMPOUNDED ITEM, rivastigmine, amiodarone, folic acid, 0000000, rosuvastatin, warfarin, ramipril, spironolactone, pantoprazole, and clonazePAM.  No orders of the defined types were placed in this encounter.     Follow-up: No Follow-up on file.  Scarlette Calico, MD

## 2015-03-05 ENCOUNTER — Telehealth: Payer: Self-pay | Admitting: Internal Medicine

## 2015-03-05 ENCOUNTER — Ambulatory Visit (INDEPENDENT_AMBULATORY_CARE_PROVIDER_SITE_OTHER): Payer: Medicare Other

## 2015-03-05 DIAGNOSIS — J309 Allergic rhinitis, unspecified: Secondary | ICD-10-CM

## 2015-03-05 NOTE — Telephone Encounter (Signed)
Pharmacy called in an said there was suppose to be a new script called in but they do not have a new one?  Can you call them?    [place]

## 2015-03-06 NOTE — Telephone Encounter (Signed)
Called pt. He does not remember what script, when he gets home he will back when he gets clarification

## 2015-03-06 NOTE — Telephone Encounter (Signed)
Pt lost script for clonozapam. This has been given verbally. Pt thought he took Ambien. He is very confused about his medication. He has not taken this since 2015

## 2015-03-10 ENCOUNTER — Ambulatory Visit (HOSPITAL_COMMUNITY)
Admission: RE | Admit: 2015-03-10 | Discharge: 2015-03-10 | Disposition: A | Discharge status: Home / Self Care | Payer: Medicare Other | Source: Ambulatory Visit | Attending: Internal Medicine | Admitting: Internal Medicine

## 2015-03-10 DIAGNOSIS — R1011 Right upper quadrant pain: Secondary | ICD-10-CM | POA: Diagnosis not present

## 2015-03-10 DIAGNOSIS — R109 Unspecified abdominal pain: Secondary | ICD-10-CM | POA: Diagnosis not present

## 2015-03-10 DIAGNOSIS — K828 Other specified diseases of gallbladder: Secondary | ICD-10-CM | POA: Diagnosis not present

## 2015-03-10 DIAGNOSIS — R10811 Right upper quadrant abdominal tenderness: Secondary | ICD-10-CM

## 2015-03-10 MED ORDER — TECHNETIUM TC 99M MEBROFENIN IV KIT
5.0000 | PACK | Freq: Once | INTRAVENOUS | Status: AC | PRN
  Administered 2015-03-10: 5 via INTRAVENOUS

## 2015-03-12 ENCOUNTER — Ambulatory Visit (INDEPENDENT_AMBULATORY_CARE_PROVIDER_SITE_OTHER): Payer: Medicare Other

## 2015-03-12 ENCOUNTER — Ambulatory Visit (INDEPENDENT_AMBULATORY_CARE_PROVIDER_SITE_OTHER): Payer: Medicare Other | Admitting: *Deleted

## 2015-03-12 DIAGNOSIS — I255 Ischemic cardiomyopathy: Secondary | ICD-10-CM

## 2015-03-12 DIAGNOSIS — Z95810 Presence of automatic (implantable) cardiac defibrillator: Secondary | ICD-10-CM | POA: Diagnosis not present

## 2015-03-12 DIAGNOSIS — J309 Allergic rhinitis, unspecified: Secondary | ICD-10-CM | POA: Diagnosis not present

## 2015-03-12 DIAGNOSIS — K828 Other specified diseases of gallbladder: Secondary | ICD-10-CM | POA: Diagnosis not present

## 2015-03-12 LAB — CUP PACEART INCLINIC DEVICE CHECK
Date Time Interrogation Session: 20170314103351
HighPow Impedance: 78 Ohm
Implantable Lead Implant Date: 20130614
Implantable Lead Location: 753858
Implantable Lead Location: 753859
Implantable Lead Model: 7122
Lead Channel Impedance Value: 440 Ohm
Lead Channel Impedance Value: 530 Ohm
Lead Channel Sensing Intrinsic Amplitude: 12 mV
Lead Channel Sensing Intrinsic Amplitude: 2.8 mV
Lead Channel Setting Pacing Amplitude: 2 V
Lead Channel Setting Pacing Pulse Width: 0.5 ms
Lead Channel Setting Sensing Sensitivity: 0.5 mV
MDC IDC LEAD IMPLANT DT: 20131223
MDC IDC LEAD IMPLANT DT: 20131223
MDC IDC LEAD LOCATION: 753860
MDC IDC LEAD MODEL: 4136
MDC IDC LEAD SERIAL: 29201222
MDC IDC MSMT BATTERY REMAINING PERCENTAGE: 56 %
MDC IDC MSMT LEADCHNL LV IMPEDANCE VALUE: 1175 Ohm
MDC IDC PG SERIAL: 7057550
MDC IDC SET LEADCHNL LV PACING AMPLITUDE: 2 V
MDC IDC SET LEADCHNL RV PACING AMPLITUDE: 2.5 V
MDC IDC SET LEADCHNL RV PACING PULSEWIDTH: 0.5 ms

## 2015-03-13 ENCOUNTER — Ambulatory Visit (INDEPENDENT_AMBULATORY_CARE_PROVIDER_SITE_OTHER): Payer: Medicare Other

## 2015-03-13 DIAGNOSIS — Z95810 Presence of automatic (implantable) cardiac defibrillator: Secondary | ICD-10-CM | POA: Diagnosis not present

## 2015-03-13 DIAGNOSIS — I5022 Chronic systolic (congestive) heart failure: Secondary | ICD-10-CM

## 2015-03-13 NOTE — Progress Notes (Signed)
Remote ICD transmission.   

## 2015-03-13 NOTE — Progress Notes (Signed)
EPIC Encounter for ICM Monitoring  Late entry for 03/12/2015  Patient Name: Corey [name] is a 80 y.o. male Date: 03/13/2015 Primary Care Physican: Scarlette Calico, MD Primary Cardiologist: Allred Electrophysiologist: Allred Dry Weight: 196 lbs  Bi-V Pacing > 99%       In the past month, have you:  1. Gained more than 2 pounds in a day or more than 5 pounds in a week? no  2. Had changes in your medications (with verification of current medications)? no  3. Had more shortness of breath than is usual for you? no  4. Limited your activity because of shortness of breath? no  5. Not been able to sleep because of shortness of breath? no  6. Had increased swelling in your feet or ankles? no  7. Had symptoms of dehydration (dizziness, dry mouth, increased thirst, decreased urine output) no  8. Had changes in sodium restriction? no  9. Been compliant with medication? Yes   ICM trend: 3 month view    ICM trend: 1 year view   Follow-up plan: ICM clinic phone appointment 04/21/2015.  Corvue thoracic impedance below reference line 02/13/2015 to 02/24/2015 suggesting fluid accumulation.  Thoracic impedance above the reference line 02/25/2015 to 03/04/2015 suggesting dryness.  Patient denied any fluid or dehydration symptoms.  He stated he has bene traveling some which my contribute to the changes.   He stated he is doing fine at this time.  No changes today.    Copy of note sent to patient's primary care physician, primary cardiologist, and device following physician.  Rosalene Billings, RN, CCM 03/13/2015 3:31 PM

## 2015-03-19 ENCOUNTER — Ambulatory Visit (INDEPENDENT_AMBULATORY_CARE_PROVIDER_SITE_OTHER): Payer: Medicare Other

## 2015-03-19 DIAGNOSIS — J309 Allergic rhinitis, unspecified: Secondary | ICD-10-CM | POA: Diagnosis not present

## 2015-04-02 ENCOUNTER — Telehealth: Payer: Self-pay | Admitting: Pharmacist

## 2015-04-02 ENCOUNTER — Ambulatory Visit (INDEPENDENT_AMBULATORY_CARE_PROVIDER_SITE_OTHER): Payer: Medicare Other | Admitting: *Deleted

## 2015-04-02 DIAGNOSIS — J309 Allergic rhinitis, unspecified: Secondary | ICD-10-CM

## 2015-04-02 NOTE — Telephone Encounter (Signed)
LMOM pt has not had Coumadin checked since November 2016. Advised him to call back to schedule INR check.

## 2015-04-07 ENCOUNTER — Telehealth: Payer: Self-pay

## 2015-04-07 NOTE — Telephone Encounter (Signed)
Received voice mail message from patient asking for date of next scheduled transmission.    Call to patient and advised next automatic remote transmission is scheduled for 04/16/2015 and will contact after it is received.  He stated that is fine.

## 2015-04-08 ENCOUNTER — Encounter: Payer: Self-pay | Admitting: Cardiology

## 2015-04-09 ENCOUNTER — Ambulatory Visit: Payer: Medicare Other

## 2015-04-15 ENCOUNTER — Ambulatory Visit (INDEPENDENT_AMBULATORY_CARE_PROVIDER_SITE_OTHER): Payer: Medicare Other | Admitting: *Deleted

## 2015-04-15 DIAGNOSIS — I480 Paroxysmal atrial fibrillation: Secondary | ICD-10-CM | POA: Diagnosis not present

## 2015-04-15 DIAGNOSIS — Z7901 Long term (current) use of anticoagulants: Secondary | ICD-10-CM | POA: Diagnosis not present

## 2015-04-15 DIAGNOSIS — Z5181 Encounter for therapeutic drug level monitoring: Secondary | ICD-10-CM

## 2015-04-15 DIAGNOSIS — I4891 Unspecified atrial fibrillation: Secondary | ICD-10-CM

## 2015-04-15 LAB — POCT INR: INR: 2.3

## 2015-04-16 ENCOUNTER — Ambulatory Visit (INDEPENDENT_AMBULATORY_CARE_PROVIDER_SITE_OTHER): Payer: Medicare Other | Admitting: *Deleted

## 2015-04-16 ENCOUNTER — Ambulatory Visit (INDEPENDENT_AMBULATORY_CARE_PROVIDER_SITE_OTHER): Payer: Medicare Other

## 2015-04-16 DIAGNOSIS — Z95810 Presence of automatic (implantable) cardiac defibrillator: Secondary | ICD-10-CM | POA: Diagnosis not present

## 2015-04-16 DIAGNOSIS — I5022 Chronic systolic (congestive) heart failure: Secondary | ICD-10-CM

## 2015-04-16 DIAGNOSIS — J309 Allergic rhinitis, unspecified: Secondary | ICD-10-CM

## 2015-04-16 NOTE — Progress Notes (Signed)
EPIC Encounter for ICM Monitoring  Patient Name: Corey [name] is a 80 y.o. male Date: 04/16/2015 Primary Care Physican: Scarlette Calico, MD Primary Cardiologist: Allred Electrophysiologist: Allred Dry Weight: 198 lb   Bi-V Pacing >99%      In the past month, have you:  1. Gained more than 2 pounds in a day or more than 5 pounds in a week? no  2. Had changes in your medications (with verification of current medications)? no  3. Had more shortness of breath than is usual for you? no  4. Limited your activity because of shortness of breath? no  5. Not been able to sleep because of shortness of breath? no  6. Had increased swelling in your feet or ankles? no  7. Had symptoms of dehydration (dizziness, dry mouth, increased thirst, decreased urine output) no  8. Had changes in sodium restriction? no  9. Been compliant with medication? Yes   ICM trend: 3 month view for 04/16/2015   ICM trend: 1 year view for 04/16/2015   Follow-up plan: ICM clinic phone appointment on 05/20/2015.  Thoracic impedance below reference line from 04/10/2015 to 04/12/2015 suggesting fluid accumulation and returned to reference line 04/13/2015.  Patient denied any fluid symptoms.  Reminded to monitor sodium intake.  Encouraged to call for any fluid symptoms.  No changes today.    Rosalene Billings, RN, CCM 04/16/2015 2:44 PM

## 2015-04-23 ENCOUNTER — Ambulatory Visit (INDEPENDENT_AMBULATORY_CARE_PROVIDER_SITE_OTHER): Payer: Medicare Other | Admitting: *Deleted

## 2015-04-23 DIAGNOSIS — J309 Allergic rhinitis, unspecified: Secondary | ICD-10-CM | POA: Diagnosis not present

## 2015-04-24 ENCOUNTER — Other Ambulatory Visit: Payer: Self-pay | Admitting: Internal Medicine

## 2015-04-30 ENCOUNTER — Ambulatory Visit (INDEPENDENT_AMBULATORY_CARE_PROVIDER_SITE_OTHER): Payer: Medicare Other | Admitting: *Deleted

## 2015-04-30 DIAGNOSIS — J309 Allergic rhinitis, unspecified: Secondary | ICD-10-CM | POA: Diagnosis not present

## 2015-05-04 ENCOUNTER — Other Ambulatory Visit: Payer: Self-pay | Admitting: Internal Medicine

## 2015-05-06 ENCOUNTER — Encounter: Payer: Self-pay | Admitting: Internal Medicine

## 2015-05-06 ENCOUNTER — Ambulatory Visit (INDEPENDENT_AMBULATORY_CARE_PROVIDER_SITE_OTHER): Payer: Medicare Other | Admitting: Internal Medicine

## 2015-05-06 VITALS — BP 122/78 | HR 69 | Ht 72.0 in | Wt 202.4 lb

## 2015-05-06 DIAGNOSIS — J3089 Other allergic rhinitis: Secondary | ICD-10-CM

## 2015-05-06 DIAGNOSIS — J309 Allergic rhinitis, unspecified: Secondary | ICD-10-CM | POA: Diagnosis not present

## 2015-05-06 DIAGNOSIS — J302 Other seasonal allergic rhinitis: Secondary | ICD-10-CM

## 2015-05-06 DIAGNOSIS — I255 Ischemic cardiomyopathy: Secondary | ICD-10-CM

## 2015-05-06 DIAGNOSIS — I5022 Chronic systolic (congestive) heart failure: Secondary | ICD-10-CM

## 2015-05-06 DIAGNOSIS — G4733 Obstructive sleep apnea (adult) (pediatric): Secondary | ICD-10-CM

## 2015-05-06 NOTE — Assessment & Plan Note (Signed)
He sleeps better with CPAP and download confirms.  Plan- If his DME can't get mask he prefers, then he can look for it on-line.

## 2015-05-06 NOTE — Patient Instructions (Addendum)
Order- DME [facility]- continue CPAP 7, mask of choice, humidifier, supplies, AirView                           Please help him with mask fit for comfort       Dx OSA   If [facility] can't get you the CPAP mask you prefer, you can try looking up on line at [email or web address]  Print script for CPAP mask of choice     Dx OSA  We can continue allergy vaccine 1:10 GH through this year as discussed

## 2015-05-06 NOTE — Assessment & Plan Note (Signed)
Clinically in good control

## 2015-05-06 NOTE — Assessment & Plan Note (Signed)
We discussed tentative plan for me to retire in a year. He can stop allergy shots at any time. On transfer to another allergy practice, they will probably want to retest him. He is advised to see how he doews for months off allergy shots before starting over.

## 2015-05-06 NOTE — Progress Notes (Signed)
11/01/10-80 year old male with remote smoking history, followed for chronic asthmatic bronchitis/bronchiectasis, history of sinusitis, allergic rhinitis, sleep apnea Last here 03/15/10 He wants to wait until November for flu shot. He has not had recent or acute symptoms related to his breathing and has not used Advair in 6 months. He didn't find it helpful. Rare use of Proair, maybe once every 3-4 months. He continues allergy vaccine, missing a dose sometimes if he is away from town. He notices no problems and offers no questions. He continues to use CPAP all night every night. He had had a palatoplasty he doesn't remember when and wonders if this was done at the time of his tonsillectomy as a young child. He notices persistent narrowness through the right nostril related to his septal deviation. Nasonex helps. We discussed a trial of Breathe Right strips to be used under his CPAP mask. He can feel when his atrial fibrillation is more active, as in the past 2 days. He treats his difficulty initiating and maintaining sleep with a one half of a 5 mg Ambien tablet when needed.   11/01/10-80 year old male with remote smoking history, followed for chronic asthmatic bronchitis/bronchiectasis, history of sinusitis, allergic rhinitis, sleep apnea Walked in today as an acute unscheduled visit complaining of cough since lunchtime. He got a throat tickle walking to his car and got gradually worse. Persistent for 4 hours with chest congestion. Chronic feeling of raspy throat clearing. Orthopedic knee surgery one month ago/Dr. Tonita Cong. Oxycodone given in hospital cause shaking, insomnia, panic attack. When he got home he took some leftover hydrocodone and had the same experience, up all night, pacing and anxious. This is set off his chronic anxiety. Dr Tonita Cong called in lorazepam- helps. Continues CPAP all night every night.  05/16/11- 80 year old male with remote smoking history, followed for chronic asthmatic  bronchitis/bronchiectasis, history of sinusitis, allergic rhinitis, sleep apnea, AFib/ CAD/ MI, GERD He admits his anxiety is getting to him. 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 Apparently he was put to sleep with intention to remove the lesion but they found they could not. He is left persistently hoarse.  He had just had back surgery 8 weeks previously and has been taking oxycodone for back pain. Lorazepam helps him sleep at night.  CPAP continues all night every night with good control. 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  11/03/11- 80 year old male with remote smoking history, followed for chronic asthmatic bronchitis/bronchiectasis, history of sinusitis, allergic rhinitis, sleep apnea, AFib/ CAD/ MI, GERD He wants to wait until November for flu shot. Continues allergy vaccine 1:10 GH. He has not had much respiratory problem until the last one or 2 weeks when he began throat clearing. Has used his rescue inhaler only once or twice. In February he had spine surgery for spinal stenosis. Oxycodone caused panic attack lasting 3 months. Then had a vocal cord biopsy at [facility]. Exacerbation of atrial fibrillation treated with pacemaker in June, then amiodarone. He still feels more short of breath than normal but is starting cardiac rehabilitation. CPAP/ [facility].  12/26/11-  80 year old male with remote smoking history, followed for chronic asthmatic bronchitis/bronchiectasis, history of sinusitis, allergic rhinitis, sleep apnea, AFib/ CAD/ MI, GERD     Wife here FOLLOWS FOR: on last dose of Amoxicillin(dx'd with PNA at hospital 12-16-11; feels somewhat better but not 100%.  Still on allergy vaccine. Acute  illness is now improving. Antibiotic ends today. No fever, minimal phlegm, throat tickle, shortness of breath with sustained speech. Denies cough or wheeze. CXR  12/18/11- reviewed IMPRESSION:  Improved right lower lobe airspace disease. Small right effusion  noted.  Original Report Authenticated By: Orlean Patten, M.D.    03/29/2012 Acute OV  Complains of sneezing, runny nose with clear drainage, watery eyes, increased saliva x 6 months .  Has multiple complaints since surgery last year for pacemaker and back surgery.  Has significant drippy nose and post nasal drainage. Mainly clear drainage. Has to clear throat.  Has dry cough on/off  Does gets choked on saliva and thin liquids.  No significant dysphagia at present. Marland Kitchen Has had previous esophageal dilatation.  Has excessive saliva intermittently.  Gets weekly allergy shots.  Started on Dymista -did not use but once because had a headache.  No discolored mucus, fever, chest pain or edema.  >>rec to stop ACE , rx claritin   04/30/2012 Acute OV  Complains of prod cough with yellow mucus, increased SOB x 8 days.  Recently on vacation on cruise.  Patient reports that he started coughing. Prior to beginning his vacation. And cough is progressively gotten worse. He's been taking over-the-counter cold products, and Tylenol without much relief. He denies any hemoptysis, orthopnea, chest pain, palpitations, syncope Patient was recommended to have his ACE inhibitor  Change at last visit. However, patient reports that his doctor does not feel like this is causing his cough and his dose was increased.  05/16/12- 80 year old male with remote smoking history, followed for chronic asthmatic bronchitis/bronchiectasis, history of sinusitis, allergic rhinitis, sleep apnea, AFib/ CAD/ MI, GERD   FOLLOWS FOR: still on vaccine and doing well; no major flare ups at this time. Describes pain in the left mid back intermittent x10 years. His primary physician has thought it was arthritis and referred for physical therapy which starts tomorrow. History of spinal stenosis surgery. He had an acute bronchitis at last office  visit, responding to Augmentin, prednisone and Mucinex DM. He continues allergy vaccine 1:10 GH. CXR 05/01/12 *RADIOLOGY REPORT*  Clinical Data: Cough and shortness of breath.  CHEST - 2 VIEW  Comparison: 01/17/2012  Findings: Stable appearance of the left cardiac ICD leads. The  lungs are clear without airspace disease or edema. Stable  appearance of the heart and mediastinum. Stable densities or  calcifications in the left mid lung region.  IMPRESSION:  No active cardiopulmonary disease.  Original Report Authenticated By: Markus Daft, M.D.  11/19/12- 80 year old male with remote smoking history, followed for chronic asthmatic bronchitis/bronchiectasis, history of sinusitis, allergic rhinitis, OSA/ CPAP/ UPPP, AFib/ CAD/ MI, GERD   FOLLOWS FOR: still on vaccine-has missed 2 weeks due to traveling. DME is [facility]. Wears CPAP every night for about 8-9 hours; pressure working well for patient. Okay with CPAP/[facility] 5-15. Old machine has burned out and needs replacement Continues allergy vaccine 1:10 GH  05/20/13- 80 year old male with remote smoking history, followed for chronic asthmatic bronchitis/bronchiectasis, history of sinusitis, allergic rhinitis, OSA/ CPAP/ UPPP, AFib/ CAD/ MI, GERD   FOLLOWS FOR: Breathing is doing well, no concerns.  Doing well on allergy vaccine 1:10 GH.  Wearing CPAP Auto 5-15/ [facility] 8 hours per night. Old CPAP machine works fine. He got a new machine but never got used to it so he returned to his old one.  11/18/13- 80 year old male with remote smoking history, followed for chronic asthmatic bronchitis/bronchiectasis, history of sinusitis, allergic rhinitis, OSA/ CPAP/ UPPP, complicated  by  AFib/ CAD/ MI, GERD, long hx anxiety/ depression Wife here FOLLOW FOR: OSA; wears CPAP 6CM/ [facility] every night, 10-12 hours nightly.  Not feeling rested.  Neurologist and  PCP both believe that pt is depressed due to inability to sleep well. Mask not fit properly. He is  using old machine- never got his new one adjusted.  Wife says he has early Alzheimers. He lies in bed 10-11 hours, frequently awake after ambien 1/2 x 5 mg.  05/22/14- 80 year old male with remote smoking history, followed for chronic asthmatic bronchitis/bronchiectasis, history of sinusitis, allergic rhinitis, OSA/ CPAP/ UPPP, complicated by  AFib/ CAD/ MI, GERD, long hx anxiety/ depression Wife here FOLLOWS FOR: Wears CPAP 6/ [facility] every night for about 8-10 hours(states his heart dr gets the readings for his CPAP machine; still on vaccine and doing well.  05/06/2015-80 year old male former smoker followed for chronic asthmatic bronchitis/bronchiectasis, sinusitis, allergic rhinitis, OSA/CPAP/UPPP, complicated by A. fib/CAD/MI, GERD, anxiety/depression Allergy vaccine 1:10 GH CPAP 7/[facility] FOLLOWS FOR: Wears CPAP every night between 8-10 hours. Doing well with weekly allergy shot.  Download confirms excellent compliance and control. He can no longer get preferred mask. We discussed on-line sources.  ROS-see HPI Constitutional:   No-   weight loss, night sweats, fevers, chills, fatigue, lassitude. HEENT:   No-  headaches, difficulty swallowing, tooth/dental problems, sore throat,       No-  sneezing, itching, ear ache, nasal congestion, post nasal drip,  CV:  No-   chest pain, orthopnea, PND, swelling in lower extremities, anasarca,  dizziness, palpitations Resp: No-   shortness of breath with exertion or at rest.              No-   productive cough,  No non-productive cough,  No- coughing up of blood.              No-   change in color of mucus.  No- wheezing.   Skin: No-   rash or lesions. GI:  No-   heartburn, indigestion, abdominal pain, nausea, vomiting, GU:  MS:  No-   joint pain or swelling.  .  + back pain. Neuro-     nothing unusual Psych:  No- change in mood or affect. +depression or anxiety.  No memory loss.  OBJ- Physical Exam General- Alert, Oriented, Affect-appropriate,  Distress- none acute Skin- rash-none, lesions- none, excoriation- none.  Lymphadenopathy- none Head- atraumatic            Eyes- Gross vision intact, PERRLA, conjunctivae and secretions clear            Ears- Hearing, canals-normal            Nose- Clear, no-Septal dev, mucus, polyps, erosion, perforation             Throat- + UPPP/ Mallampati II ,                         tonsils- atrophic Neck- flexible , trachea midline, no stridor , thyroid nl, carotid no bruit Chest - symmetrical excursion , unlabored           Heart/CV- RRR , no murmur , no gallop  , no rub, nl s1 s2                           - JVD- none , edema- none, stasis changes- none, varices- none  Lung- clear, wheeze- none, cough- none , dullness-none, rub- none           Chest wall- +L pacemaker Abd- Br/ Gen/ Rectal- Not done, not indicated Extrem- cyanosis- none, clubbing, none, atrophy- none, strength- nl Neuro- grossly intact to observation

## 2015-05-13 ENCOUNTER — Other Ambulatory Visit: Payer: Self-pay | Admitting: Internal Medicine

## 2015-05-13 ENCOUNTER — Other Ambulatory Visit: Payer: Self-pay

## 2015-05-13 ENCOUNTER — Ambulatory Visit (INDEPENDENT_AMBULATORY_CARE_PROVIDER_SITE_OTHER): Payer: Medicare Other | Admitting: *Deleted

## 2015-05-13 DIAGNOSIS — I4891 Unspecified atrial fibrillation: Secondary | ICD-10-CM | POA: Diagnosis not present

## 2015-05-13 DIAGNOSIS — Z7901 Long term (current) use of anticoagulants: Secondary | ICD-10-CM

## 2015-05-13 DIAGNOSIS — Z5181 Encounter for therapeutic drug level monitoring: Secondary | ICD-10-CM

## 2015-05-13 DIAGNOSIS — I480 Paroxysmal atrial fibrillation: Secondary | ICD-10-CM

## 2015-05-13 LAB — POCT INR: INR: 3.1

## 2015-05-13 MED ORDER — PANTOPRAZOLE SODIUM 40 MG PO TBEC: 40.0000 mg | DELAYED_RELEASE_TABLET | Freq: Every day | ORAL | Status: DC

## 2015-05-14 ENCOUNTER — Ambulatory Visit (INDEPENDENT_AMBULATORY_CARE_PROVIDER_SITE_OTHER): Payer: Medicare Other | Admitting: *Deleted

## 2015-05-14 DIAGNOSIS — J309 Allergic rhinitis, unspecified: Secondary | ICD-10-CM

## 2015-05-15 ENCOUNTER — Encounter: Payer: Self-pay | Admitting: Internal Medicine

## 2015-05-15 DIAGNOSIS — M7072 Other bursitis of hip, left hip: Secondary | ICD-10-CM | POA: Diagnosis not present

## 2015-05-20 ENCOUNTER — Telehealth: Payer: Self-pay | Admitting: Cardiology

## 2015-05-20 ENCOUNTER — Telehealth: Payer: Self-pay | Admitting: Neurology

## 2015-05-20 NOTE — Telephone Encounter (Signed)
Corey [name] Jun 10, 2035. His wife [name] called today. She has a question for you about her husband. Her number is [phone]. Thank you

## 2015-05-20 NOTE — Telephone Encounter (Signed)
LMOVM reminding pt to send remote transmission.   

## 2015-05-21 NOTE — Telephone Encounter (Signed)
He shouldn't drive.  He should really be seen by a neurologist.  If they are going to be away for the entire summer, I recommend at least that he is re-evaluated by a local neurologist to see what changes are needed in regards to management.

## 2015-05-21 NOTE — Telephone Encounter (Signed)
He should not drive until further evaluation.  I need to re-evaluate him.  I think he should come in to see me prior to them going away for the summer as he has had this change.
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
## 2015-05-21 NOTE — Telephone Encounter (Signed)
Leaving next week, I believe. Wife stated they wanted to come in this week, or they wouldn't be able to. Please advise.

## 2015-05-22 NOTE — Telephone Encounter (Signed)
Wife aware. States she will try her best to keep pt from driving, but states it will be hard, and she is unsure if possible.  Wife also [place] to a neurologist. They will be w/in 150 miles of [facility] and [facility]. Did reitterate provider's recommendations, and concerns. Will place on wait list, as wife clarified they are not leaving until Wednesday.

## 2015-05-25 ENCOUNTER — Encounter: Payer: Self-pay | Admitting: Internal Medicine

## 2015-05-25 ENCOUNTER — Ambulatory Visit (INDEPENDENT_AMBULATORY_CARE_PROVIDER_SITE_OTHER): Payer: Medicare Other | Admitting: Internal Medicine

## 2015-05-25 VITALS — BP 108/64 | HR 65 | Ht 73.0 in | Wt 197.0 lb

## 2015-05-25 DIAGNOSIS — I255 Ischemic cardiomyopathy: Secondary | ICD-10-CM

## 2015-05-25 DIAGNOSIS — I480 Paroxysmal atrial fibrillation: Secondary | ICD-10-CM

## 2015-05-25 DIAGNOSIS — I5022 Chronic systolic (congestive) heart failure: Secondary | ICD-10-CM | POA: Diagnosis not present

## 2015-05-25 DIAGNOSIS — I4891 Unspecified atrial fibrillation: Secondary | ICD-10-CM | POA: Diagnosis not present

## 2015-05-25 LAB — CUP PACEART INCLINIC DEVICE CHECK
Brady Statistic RA Percent Paced: 99 %
Brady Statistic RV Percent Paced: 99.69 %
Date Time Interrogation Session: 20170501163048
HIGH POWER IMPEDANCE MEASURED VALUE: 79.875
Implantable Lead Implant Date: 20130614
Implantable Lead Location: 753858
Implantable Lead Location: 753859
Lead Channel Impedance Value: 475 Ohm
Lead Channel Pacing Threshold Amplitude: 0.75 V
Lead Channel Pacing Threshold Amplitude: 0.75 V
Lead Channel Pacing Threshold Amplitude: 1.25 V
Lead Channel Pacing Threshold Pulse Width: 0.5 ms
Lead Channel Pacing Threshold Pulse Width: 0.5 ms
Lead Channel Sensing Intrinsic Amplitude: 12 mV
Lead Channel Setting Pacing Amplitude: 2 V
Lead Channel Setting Pacing Pulse Width: 0.5 ms
Lead Channel Setting Sensing Sensitivity: 0.5 mV
MDC IDC LEAD IMPLANT DT: 20131223
MDC IDC LEAD IMPLANT DT: 20131223
MDC IDC LEAD LOCATION: 753860
MDC IDC LEAD MODEL: 4136
MDC IDC LEAD MODEL: 7122
MDC IDC LEAD SERIAL: 29201222
MDC IDC MSMT BATTERY REMAINING LONGEVITY: 44.4
MDC IDC MSMT LEADCHNL LV IMPEDANCE VALUE: 1262.5 Ohm
MDC IDC MSMT LEADCHNL LV PACING THRESHOLD AMPLITUDE: 0.75 V
MDC IDC MSMT LEADCHNL LV PACING THRESHOLD PULSEWIDTH: 0.5 ms
MDC IDC MSMT LEADCHNL RA IMPEDANCE VALUE: 575 Ohm
MDC IDC MSMT LEADCHNL RA SENSING INTR AMPL: 2.6 mV
MDC IDC MSMT LEADCHNL RV PACING THRESHOLD PULSEWIDTH: 0.5 ms
MDC IDC SET LEADCHNL LV PACING AMPLITUDE: 2 V
MDC IDC SET LEADCHNL LV PACING PULSEWIDTH: 0.5 ms
MDC IDC SET LEADCHNL RV PACING AMPLITUDE: 2.5 V
Pulse Gen Serial Number: 7057550

## 2015-05-25 NOTE — Patient Instructions (Signed)
Medication Instructions:  Your physician recommends that you continue on your current medications as directed. Please refer to the Current Medication list given to you today.    Labwork: Your physician recommends that you return for lab work today: LIVER/TSH    Testing/Procedures: None ordered   Follow-Up: Your physician wants you to follow-up in: 6 months with Truitt Merle, NP and 12 months with Dr Vallery Ridge will receive a reminder letter in the mail two months in advance. If you don't receive a letter, please call our office to schedule the follow-up appointment.   Remote monitoring is used to monitor your  ICD from home. This monitoring reduces the number of office visits required to check your device to one time per year. It allows [place] to keep an eye on the functioning of your device to ensure it is working properly. You are scheduled for a device check from home on 08/24/15. You may send your transmission at any time that day. If you have a wireless device, the transmission will be sent automatically. After your physician reviews your transmission, you will receive a postcard with your next transmission date.    Any Other Special Instructions Will Be Listed Below (If Applicable).     If you need a refill on your cardiac medications before your next appointment, please call your pharmacy.

## 2015-05-25 NOTE — Progress Notes (Signed)
Electrophysiology Office Note   Date:  05/25/2015   ID:  JAMESPAUL CEDERQUIST, DOB July 14, 1935, MRN [id number]  PCP:  Scarlette Calico, MD  Cardiologist:  Previously Dr Rollene Fare Primary Electrophysiologist: Thompson Grayer, MD    Chief Complaint  Patient presents with  . Congestive Heart Failure     History of Present Illness: Corey [name] is a 80 y.o. male who presents today for electrophysiology evaluation.   He is doing well.  He has been a heavy drinker but says that he has significantly "cut back".  He looks good today.  He is more active.  His daughter is getting married at [employer] in September.  He is excited about this. Today, he denies symptoms of palpitations, chest pain, shortness of breath, orthopnea, PND, lower extremity edema, claudication, dizziness, presyncope, syncope, bleeding, or neurologic sequela. The patient is tolerating medications without difficulties and is otherwise without complaint today.    Past Medical History  Diagnosis Date  . Alcohol abuse, episodic 05/19/2008  . ALLERGIC RHINITIS 04/15/2009  . COLONIC POLYPS, ADENOMATOUS, HX OF 02/14/2008  . DIVERTICULOSIS, COLON 02/14/2008  . ESOPHAGEAL STRICTURE 01/28/2008  . GERD 02/14/2008  . HYPERGLYCEMIA 11/16/2006  . HYPERLIPIDEMIA 02/14/2008  . INSOMNIA-SLEEP DISORDER-UNSPEC 02/11/2009  . 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 PROSTATE CANCER, HX OF 02/25/2000  . TRANSIENT ISCHEMIC ATTACKS, HX OF 11/16/2006  . Arthritis   . Atrial fibrillation ([place]) 11/16/2006    remote CHF related to atrial fib with rapid ventricular response over 25 yrs per office note,  . ASTHMA 11/16/2006    sinusitis-    hx ?yeast patch/white patch on vocal cord as per [facility] 02/25/11-   . OBSTRUCTIVE SLEEP APNEA 11/10/2008  . SLEEP APNEA 10/03/2009    LOV Dr [name] 12/12 in EPIC    Moderate per patient- settings ?6/last sleep study years ago  . HYPERTENSION 11/16/2006  . Symptomatic bradycardia, secondary to sinus node dysfunction 07/08/2011      s/p [facility] [employer] PPM by Dr Sallyanne Kuster, upgraded to [place] ICD 12/13 by Dr Rayann Heman (SJM)  . Myocardial infarction ([place]) 1993  . Pacemaker     St. Jude  . ICD (implantable cardiac defibrillator) in place   . Anxiety   . Depression   . Pneumonia     hx of  . Headache(784.0)   . Cancer [facility]) 2002    prostate cancer  . CORONARY ARTERY DISEASE 11/16/2006    LHC (07/2011): LAD with luminal irregularities, proximal circumflex 50%, EF 25%  . Chronic systolic CHF (congestive heart failure) (HCC)     echo (10/02/12): EF 123456, grade 1 diastolic dysfunction, trivial AI, mild MR, mild RVE  . NICM (nonischemic cardiomyopathy) ([place])   . Stroke ([place])     Tia  . Lesion of vocal cord     bx begine  . Allergy     YEAR ROUND,TAKE SHOTS  . Sleep apnea   . Alzheimer's dementia     beginning with sx   Past Surgical History  Procedure Laterality Date  . Tonsilectomy, adenoidectomy, bilateral myringotomy and tubes    . Knee arthroscopy      2 surgeries right and 1 left  . Prostate surgery  2/02    prostate cancer  . Uvulopalatopharyngoplasty  2013  . Esophagogastroduodenoscopy      with dilitation  . Lumbar laminectomy/decompression microdiscectomy  03/02/2011    Procedure: LUMBAR LAMINECTOMY/DECOMPRESSION MICRODISCECTOMY;  Surgeon: [name], MD;  Location: [facility] ORS;  Service: Orthopedics;  Laterality: N/A;  Decompression Lumbar 4 - Lumbar(X-Ray)  . Pacemaker insertion  06/2011    [employer] PPM implanted by Dr [employer]  . Cardiac defibrillator placement  01/16/12    Upgrade to a biventricular SJM ICD by DR Jeremias Broyhill  . Tonsillectomy  as child  . Cardiac catheterization      several  . Back surgery    . Insert / replace / remove pacemaker    . Total knee arthroplasty Left 06/04/2012    Procedure: TOTAL KNEE ARTHROPLASTY;  Surgeon: [name], MD;  Location: [facility];  Service: Orthopedics;  Laterality: Left;  . Colonoscopy    . Vocal cord lesion bx    . Permanent pacemaker  insertion Left 07/08/2011    Procedure: PERMANENT PACEMAKER INSERTION;  Surgeon: [name], MD;  Location: [facility] CATH LAB;  Service: Cardiovascular;  Laterality: Left;  . Left and right heart catheterization with coronary angiogram N/A 08/18/2011    Procedure: LEFT AND RIGHT HEART CATHETERIZATION WITH CORONARY ANGIOGRAM;  Surgeon: [name], MD;  Location: [facility] CATH LAB;  Service: Cardiovascular;  Laterality: N/A;  . Bi-ventricular implantable cardioverter defibrillator upgrade N/A 01/16/2012    Procedure: BI-VENTRICULAR IMPLANTABLE CARDIOVERTER DEFIBRILLATOR UPGRADE;  Surgeon: Thompson Grayer, MD;  Location: [facility] CATH LAB;  Service: Cardiovascular;  Laterality: N/A;     Current Outpatient Prescriptions  Medication Sig Dispense Refill  . albuterol (PROVENTIL HFA;VENTOLIN HFA) 108 (90 BASE) MCG/ACT inhaler Inhale 1-2 puffs into the lungs every 6 (six) hours as needed for shortness of breath. 1 Inhaler 11  . amiodarone (PACERONE) 200 MG tablet TAKE ONE-HALF TABLET DAILY 45 tablet 0  . clonazePAM (KLONOPIN) 0.5 MG tablet TAKE 1/2 TO 2 TABLETS AS NEEDED FOR SLEEP 30 tablet 2  . Cyanocobalamin (B-12) 100 MCG TABS Take by mouth.    . folic acid (FOLVITE) 1 MG tablet TAKE ONE TABLET EACH DAY 90 tablet 3  . metoprolol succinate (TOPROL-XL) 50 MG 24 hr tablet Take 1/2 tablet by mouth every evening 15 tablet 11  . mometasone (NASONEX) 50 MCG/ACT nasal spray TWO SPRAYS IN EACH NOSTRIL DAILY AS NEEDED FOR ALLERGIES    . NONFORMULARY OR COMPOUNDED ITEM Allergy Vaccine 1:10 Given at [facility]    . pantoprazole (PROTONIX) 40 MG tablet Take 1 tablet (40 mg total) by mouth daily. 30 tablet 3  . polyethylene glycol powder (GLYCOLAX/MIRALAX) powder Take 17 g by mouth daily as needed (constipation).     . ramipril (ALTACE) 10 MG capsule TAKE TWO CAPSULES EVERY DAY 60 capsule 5  . rivastigmine (EXELON) 1.5 MG capsule Take 1 capsule by mouth daily. Take one capsule by mouth daily.    . rosuvastatin  (CRESTOR) 5 MG tablet TAKE ONE TABLET IN THE EVENING 30 tablet 11  . spironolactone (ALDACTONE) 25 MG tablet TAKE ONE TABLET BY MOUTH ONCE DAILY 30 tablet 1  . warfarin (COUMADIN) 2.5 MG tablet TAKE AS DIRECTED BY COUMADIN CLINIC 120 tablet 1   No current facility-administered medications for this visit.    Allergies:   Hydrocodone and Oxycodone   Social History:  The patient  reports that he quit smoking about 51 years ago. His smoking use included Cigarettes. He has a 4 pack-year smoking history. He has never used smokeless tobacco. He reports that he drinks about 12.0 oz of alcohol per week. He reports that he does not use illicit drugs.   Family History:  The patient's family history includes Heart attack in his brother, father, and maternal uncle; Heart disease in his maternal  uncle. There is no history of Colon cancer, Esophageal cancer, Rectal cancer, Prostate cancer, or Stomach cancer.    ROS:  Please see the history of present illness.   All other systems are reviewed and negative.    PHYSICAL EXAM: VS:  BP 108/64 mmHg  Pulse 65  Ht 6\' 1"  (1.854 m)  Wt 197 lb (89.359 kg)  BMI 26.00 kg/m2 , BMI Body mass index is 26 kg/(m^2). GEN: Well nourished, well developed, in no acute distress HEENT: normal Neck: no JVD, carotid bruits, or masses Cardiac: RRR; no murmurs, rubs, or gallops,no edema  Respiratory:  clear to auscultation bilaterally, normal work of breathing GI: soft, nontender, nondistended, + BS MS: no deformity or atrophy Skin: warm and dry, device pocket is well healed Neuro:  Strength and sensation are intact Psych: euthymic mood, full affect  EKG:  EKG is ordered today. The ekg ordered today shows AV pacing  Device interrogation is reviewed today in detail.  See PaceArt for details.   Recent Labs: 11/10/2014: ALT 22 11/18/2014: Hemoglobin 13.4; Platelets 189.0 12/17/2014: BUN 19; Creatinine, Ser 1.22; Potassium 4.8; Sodium 138; TSH 1.36    Lipid Panel      Component Value Date/Time   CHOL 140 12/17/2014 1102   TRIG 128.0 12/17/2014 1102   HDL 46.40 12/17/2014 1102   CHOLHDL 3 12/17/2014 1102   VLDL 25.6 12/17/2014 1102   LDLCALC 68 12/17/2014 1102     Wt Readings from Last 3 Encounters:  05/25/15 197 lb (89.359 kg)  05/06/15 202 lb 6.4 oz (91.808 kg)  03/02/15 201 lb 7 oz (91.371 kg)    Other studies Reviewed: Additional studies/ records that were reviewed today include: Dr Ronnald Ramp' last note and labs  ASSESSMENT AND PLAN:   1.  Chronic systolic dysfunction euvolemic today Stable on an appropriate medical regimen Normal BiV ICD function Followed in the ICM device clinic See Pace Art report No changes today I have discussed SJM Fortify Assura advisary with the patient today. He understands that recommendation from SJM is to not replace the device at this time. The patient is not device dependant.  The patient has not had appropriate device therapy in the past or implanted for secondary prevention.  Vibratory alert demonstrated today.  He is actively remotely monitored and understands the importance of compliance today.   2. ETOH Cessation advised He is trying to cut back  3. HTN Stable No change required today bmet today  4. AFib CHADS2VASC score is at least 6.  I would therefore favor lifelong anticoagulation Maintaining sinus rhythm with amiodarone 100mg  daily.  Consider reducing this further in the future. Check LFTs/TFTs today  Merlin Return to see Cecille Rubin in 6 months I will see in a year  Current medicines are reviewed at length with the patient today.   The patient does not have concerns regarding his medicines.  The following changes were made today:  none  Signed, Thompson Grayer, MD  05/25/2015 12:34 PM     [address] [place] [address] [phone] (office) [phone] (fax)

## 2015-05-26 LAB — HEPATIC FUNCTION PANEL
ALK PHOS: 73 U/L (ref 40–115)
ALT: 27 U/L (ref 9–46)
AST: 41 U/L — AB (ref 10–35)
Albumin: 4.3 g/dL (ref 3.6–5.1)
BILIRUBIN INDIRECT: 0.6 mg/dL (ref 0.2–1.2)
Bilirubin, Direct: 0.2 mg/dL (ref <=0.2)
TOTAL PROTEIN: 7.2 g/dL (ref 6.1–8.1)
Total Bilirubin: 0.8 mg/dL (ref 0.2–1.2)

## 2015-05-26 LAB — TSH: TSH: 2 mIU/L (ref 0.40–4.50)

## 2015-05-27 NOTE — Addendum Note (Signed)
Addended by: Freada Bergeron on: 05/27/2015 05:33 PM   Modules accepted: Orders

## 2015-05-28 ENCOUNTER — Ambulatory Visit (INDEPENDENT_AMBULATORY_CARE_PROVIDER_SITE_OTHER): Payer: Medicare Other | Admitting: Pharmacist

## 2015-05-28 ENCOUNTER — Other Ambulatory Visit: Payer: Self-pay | Admitting: Internal Medicine

## 2015-05-28 ENCOUNTER — Ambulatory Visit (INDEPENDENT_AMBULATORY_CARE_PROVIDER_SITE_OTHER): Payer: Medicare Other

## 2015-05-28 DIAGNOSIS — I480 Paroxysmal atrial fibrillation: Secondary | ICD-10-CM

## 2015-05-28 DIAGNOSIS — Z5181 Encounter for therapeutic drug level monitoring: Secondary | ICD-10-CM

## 2015-05-28 DIAGNOSIS — I4891 Unspecified atrial fibrillation: Secondary | ICD-10-CM

## 2015-05-28 DIAGNOSIS — J309 Allergic rhinitis, unspecified: Secondary | ICD-10-CM | POA: Diagnosis not present

## 2015-05-28 DIAGNOSIS — Z7901 Long term (current) use of anticoagulants: Secondary | ICD-10-CM | POA: Diagnosis not present

## 2015-05-28 LAB — POCT INR: INR: 3

## 2015-05-29 NOTE — Telephone Encounter (Signed)
faxed

## 2015-06-04 ENCOUNTER — Ambulatory Visit: Payer: Medicare Other

## 2015-06-11 ENCOUNTER — Encounter: Payer: Self-pay | Admitting: Internal Medicine

## 2015-06-24 ENCOUNTER — Ambulatory Visit (INDEPENDENT_AMBULATORY_CARE_PROVIDER_SITE_OTHER): Payer: Medicare Other | Admitting: *Deleted

## 2015-06-24 DIAGNOSIS — I480 Paroxysmal atrial fibrillation: Secondary | ICD-10-CM

## 2015-06-24 DIAGNOSIS — Z5181 Encounter for therapeutic drug level monitoring: Secondary | ICD-10-CM | POA: Diagnosis not present

## 2015-06-24 DIAGNOSIS — Z7901 Long term (current) use of anticoagulants: Secondary | ICD-10-CM

## 2015-06-24 DIAGNOSIS — I4891 Unspecified atrial fibrillation: Secondary | ICD-10-CM | POA: Diagnosis not present

## 2015-06-24 DIAGNOSIS — J309 Allergic rhinitis, unspecified: Secondary | ICD-10-CM

## 2015-06-24 LAB — POCT INR: INR: 2.8

## 2015-06-26 ENCOUNTER — Ambulatory Visit (INDEPENDENT_AMBULATORY_CARE_PROVIDER_SITE_OTHER): Payer: Medicare Other

## 2015-06-26 ENCOUNTER — Telehealth: Payer: Self-pay

## 2015-06-26 DIAGNOSIS — Z95810 Presence of automatic (implantable) cardiac defibrillator: Secondary | ICD-10-CM

## 2015-06-26 DIAGNOSIS — I5022 Chronic systolic (congestive) heart failure: Secondary | ICD-10-CM | POA: Diagnosis not present

## 2015-06-26 NOTE — Progress Notes (Signed)
EPIC Encounter for ICM Monitoring  Patient Name: Corey [name] is a 80 y.o. male Date: 06/26/2015 Primary Care Physican: Scarlette Calico, MD Primary Cardiologist: Allred Electrophysiologist: Allred Dry Weight: unknown   Bi-V Pacing >99%      In the past month, have you:  1. Gained more than 2 pounds in a day or more than 5 pounds in a week? N/A  2. Had changes in your medications (with verification of current medications)? N/A  3. Had more shortness of breath than is usual for you? N/A  4. Limited your activity because of shortness of breath? N/A  5. Not been able to sleep because of shortness of breath? N/A  6. Had increased swelling in your feet or ankles? N/A  7. Had symptoms of dehydration (dizziness, dry mouth, increased thirst, decreased urine output) N/A  8. Had changes in sodium restriction? N/A  9. Been compliant with medication? N/A   ICM trend: 3 month view for 06/26/2015   ICM trend: 1 year view for 07/14/2015   Follow-up plan: ICM clinic phone appointment on 08/04/2015.  Attempted call to patient and unable to reach.  Transmission reviewed.  Thoracic impedance starting to trend below reference line from 06/25/2015.      Rosalene Billings, RN, CCM 06/26/2015 2:19 PM

## 2015-06-26 NOTE — Telephone Encounter (Signed)
Remote ICM transmission received.  Attempted patient call and left message for return call.   

## 2015-06-29 ENCOUNTER — Ambulatory Visit: Payer: Medicare Other | Admitting: Neurology

## 2015-07-30 DIAGNOSIS — M545 Low back pain: Secondary | ICD-10-CM | POA: Diagnosis not present

## 2015-07-30 DIAGNOSIS — M4806 Spinal stenosis, lumbar region: Secondary | ICD-10-CM | POA: Diagnosis not present

## 2015-07-30 DIAGNOSIS — M5431 Sciatica, right side: Secondary | ICD-10-CM | POA: Diagnosis not present

## 2015-07-30 DIAGNOSIS — M47817 Spondylosis without myelopathy or radiculopathy, lumbosacral region: Secondary | ICD-10-CM | POA: Diagnosis not present

## 2015-08-04 ENCOUNTER — Ambulatory Visit (INDEPENDENT_AMBULATORY_CARE_PROVIDER_SITE_OTHER): Payer: Medicare Other

## 2015-08-04 DIAGNOSIS — I5022 Chronic systolic (congestive) heart failure: Secondary | ICD-10-CM

## 2015-08-04 DIAGNOSIS — Z95810 Presence of automatic (implantable) cardiac defibrillator: Secondary | ICD-10-CM | POA: Diagnosis not present

## 2015-08-04 NOTE — Progress Notes (Signed)
EPIC Encounter for ICM Monitoring  Patient Name: Corey [name] is a 80 y.o. male Date: 08/04/2015 Primary Care Physican: Scarlette Calico, MD Primary Cardiologist: Allred Electrophysiologist: Allred Dry Weight: 188 lb  Bi-V Pacing:  >99%       Heart Failure questions reviewed, pt asymptomatic   Thoracic impedence stable   Low sodium diet education provided  Recommendations: None  ICM trend:08/04/2015     Follow-up plan: ICM clinic phone appointment on 09/08/2015.  Copy of ICM check sent to device physician.   Rosalene Billings, RN 08/04/2015 8:37 AM

## 2015-08-13 ENCOUNTER — Ambulatory Visit (INDEPENDENT_AMBULATORY_CARE_PROVIDER_SITE_OTHER): Payer: Medicare Other

## 2015-08-13 ENCOUNTER — Other Ambulatory Visit: Payer: Self-pay | Admitting: Internal Medicine

## 2015-08-13 DIAGNOSIS — J309 Allergic rhinitis, unspecified: Secondary | ICD-10-CM

## 2015-08-14 ENCOUNTER — Ambulatory Visit (INDEPENDENT_AMBULATORY_CARE_PROVIDER_SITE_OTHER): Payer: Medicare Other | Admitting: *Deleted

## 2015-08-14 DIAGNOSIS — Z5181 Encounter for therapeutic drug level monitoring: Secondary | ICD-10-CM

## 2015-08-14 DIAGNOSIS — Z7901 Long term (current) use of anticoagulants: Secondary | ICD-10-CM

## 2015-08-14 DIAGNOSIS — I480 Paroxysmal atrial fibrillation: Secondary | ICD-10-CM

## 2015-08-14 DIAGNOSIS — I4891 Unspecified atrial fibrillation: Secondary | ICD-10-CM | POA: Diagnosis not present

## 2015-08-14 LAB — POCT INR: INR: 3.5

## 2015-08-20 ENCOUNTER — Ambulatory Visit: Payer: Medicare Other

## 2015-08-22 ENCOUNTER — Other Ambulatory Visit: Payer: Self-pay | Admitting: Neurology

## 2015-08-26 ENCOUNTER — Encounter: Payer: Self-pay | Admitting: Neurology

## 2015-08-26 ENCOUNTER — Other Ambulatory Visit (INDEPENDENT_AMBULATORY_CARE_PROVIDER_SITE_OTHER): Payer: Medicare Other

## 2015-08-26 ENCOUNTER — Ambulatory Visit (INDEPENDENT_AMBULATORY_CARE_PROVIDER_SITE_OTHER): Payer: Medicare Other | Admitting: Neurology

## 2015-08-26 VITALS — BP 104/60 | HR 79 | Ht 73.0 in | Wt 191.1 lb

## 2015-08-26 DIAGNOSIS — I255 Ischemic cardiomyopathy: Secondary | ICD-10-CM | POA: Diagnosis not present

## 2015-08-26 DIAGNOSIS — F0391 Unspecified dementia with behavioral disturbance: Secondary | ICD-10-CM

## 2015-08-26 DIAGNOSIS — F03918 Unspecified dementia, unspecified severity, with other behavioral disturbance: Secondary | ICD-10-CM

## 2015-08-26 DIAGNOSIS — F1020 Alcohol dependence, uncomplicated: Secondary | ICD-10-CM

## 2015-08-26 LAB — VITAMIN B12: VITAMIN B 12: 681 pg/mL (ref 211–911)

## 2015-08-26 MED ORDER — RIVASTIGMINE TARTRATE 1.5 MG PO CAPS: ORAL_CAPSULE | ORAL | 0 refills | Status: DC

## 2015-08-26 NOTE — Patient Instructions (Signed)
1.  We will increase Exelon as directed on bottle. 2.  Will set up for neuropsychological testing 3.  Check B12 and B1 level 4.  No driving 5.  Follow up after testing
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
## 2015-08-27 ENCOUNTER — Ambulatory Visit (INDEPENDENT_AMBULATORY_CARE_PROVIDER_SITE_OTHER): Payer: Medicare Other | Admitting: *Deleted

## 2015-08-27 ENCOUNTER — Ambulatory Visit (INDEPENDENT_AMBULATORY_CARE_PROVIDER_SITE_OTHER): Payer: Medicare Other | Admitting: Psychology

## 2015-08-27 DIAGNOSIS — J309 Allergic rhinitis, unspecified: Secondary | ICD-10-CM

## 2015-08-27 DIAGNOSIS — F32A Depression, unspecified: Secondary | ICD-10-CM

## 2015-08-27 DIAGNOSIS — R413 Other amnesia: Secondary | ICD-10-CM | POA: Diagnosis not present

## 2015-08-27 DIAGNOSIS — G3184 Mild cognitive impairment, so stated: Secondary | ICD-10-CM | POA: Diagnosis not present

## 2015-08-27 DIAGNOSIS — F1010 Alcohol abuse, uncomplicated: Secondary | ICD-10-CM

## 2015-08-27 DIAGNOSIS — F329 Major depressive disorder, single episode, unspecified: Secondary | ICD-10-CM
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
## 2015-08-30 LAB — VITAMIN B1: Vitamin B1 (Thiamine): 9 nmol/L (ref 8–30)

## 2015-09-03 ENCOUNTER — Ambulatory Visit (INDEPENDENT_AMBULATORY_CARE_PROVIDER_SITE_OTHER): Payer: Medicare Other | Admitting: *Deleted

## 2015-09-03 DIAGNOSIS — J309 Allergic rhinitis, unspecified: Secondary | ICD-10-CM | POA: Diagnosis not present

## 2015-09-03 DIAGNOSIS — I4891 Unspecified atrial fibrillation: Secondary | ICD-10-CM

## 2015-09-03 DIAGNOSIS — Z7901 Long term (current) use of anticoagulants: Secondary | ICD-10-CM

## 2015-09-03 DIAGNOSIS — Z5181 Encounter for therapeutic drug level monitoring: Secondary | ICD-10-CM | POA: Diagnosis not present

## 2015-09-03 LAB — POCT INR: INR: 3.8

## 2015-09-04 ENCOUNTER — Ambulatory Visit (INDEPENDENT_AMBULATORY_CARE_PROVIDER_SITE_OTHER): Payer: Medicare Other | Admitting: Psychology

## 2015-09-04 ENCOUNTER — Telehealth: Payer: Self-pay

## 2015-09-04 DIAGNOSIS — R413 Other amnesia: Secondary | ICD-10-CM | POA: Diagnosis not present

## 2015-09-04 NOTE — Telephone Encounter (Signed)
Message relayed to patient. Verbalized understanding and denied questions.   

## 2015-09-04 NOTE — Telephone Encounter (Signed)
-----   Message from Pieter Partridge, DO sent at 09/04/2015 12:36 PM EDT ----- Labs okay

## 2015-09-04 NOTE — Progress Notes (Signed)
   Neuropsychology Note  Corey [name] returned today for 2 hours of neuropsychological testing with technician, Milana Kidney, BS, under the supervision of Dr. Macarthur Critchley. The patient did not appear overtly distressed by the testing session, per behavioral observation or via self-report to the technician. Rest breaks were offered. Corey [name] will return within 2 weeks for a feedback session with Dr. Si Raider at which time his test performances, clinical impressions and treatment recommendations will be reviewed in detail. The patient understands he can contact our office should he require our assistance before this time.  Full report to follow.

## 2015-09-08 ENCOUNTER — Ambulatory Visit (INDEPENDENT_AMBULATORY_CARE_PROVIDER_SITE_OTHER): Payer: Medicare Other

## 2015-09-08 ENCOUNTER — Ambulatory Visit (INDEPENDENT_AMBULATORY_CARE_PROVIDER_SITE_OTHER): Payer: Medicare Other | Admitting: *Deleted

## 2015-09-08 ENCOUNTER — Telehealth: Payer: Self-pay

## 2015-09-08 DIAGNOSIS — Z95810 Presence of automatic (implantable) cardiac defibrillator: Secondary | ICD-10-CM

## 2015-09-08 DIAGNOSIS — I495 Sick sinus syndrome: Secondary | ICD-10-CM

## 2015-09-08 DIAGNOSIS — I5022 Chronic systolic (congestive) heart failure: Secondary | ICD-10-CM

## 2015-09-08 DIAGNOSIS — I255 Ischemic cardiomyopathy: Secondary | ICD-10-CM

## 2015-09-08 NOTE — Progress Notes (Signed)
EPIC Encounter for ICM Monitoring  Patient Name: Corey [name] is a 80 y.o. male Date: 09/08/2015 Primary Care Physican: Scarlette Calico, MD Primary Cardiologist: Allred Electrophysiologist: Allred Dry Weight: unknown Bi-V Pacing:  99%       Attempted patient call and unable to reach.  Transmission reviewed.   Thoracic impedance abnormal suggesting fluid accumulation 09/03/2015 to 09/07/2015 and returned to baseline 09/07/2015.  Follow-up plan: ICM clinic phone appointment on 10/12/2015.  Copy of ICM check sent to device physician.   ICM trend: 09/08/2015       Rosalene Billings, RN 09/08/2015 8:09 AM

## 2015-09-08 NOTE — Telephone Encounter (Signed)
Remote ICM transmission received.  Attempted patient call and no answer or answering machine.  

## 2015-09-08 NOTE — Progress Notes (Signed)
Remote ICD transmission.   

## 2015-09-09 ENCOUNTER — Encounter: Payer: Self-pay | Admitting: Cardiology

## 2015-09-10 ENCOUNTER — Ambulatory Visit (INDEPENDENT_AMBULATORY_CARE_PROVIDER_SITE_OTHER): Payer: Medicare Other | Admitting: *Deleted

## 2015-09-10 ENCOUNTER — Encounter: Payer: Self-pay | Admitting: Psychology

## 2015-09-10 ENCOUNTER — Ambulatory Visit (INDEPENDENT_AMBULATORY_CARE_PROVIDER_SITE_OTHER): Payer: Medicare Other | Admitting: Psychology

## 2015-09-10 DIAGNOSIS — R413 Other amnesia: Secondary | ICD-10-CM

## 2015-09-10 DIAGNOSIS — F32A Depression, unspecified: Secondary | ICD-10-CM

## 2015-09-10 DIAGNOSIS — F329 Major depressive disorder, single episode, unspecified: Secondary | ICD-10-CM | POA: Diagnosis not present

## 2015-09-10 DIAGNOSIS — J309 Allergic rhinitis, unspecified: Secondary | ICD-10-CM

## 2015-09-10 DIAGNOSIS — F1010 Alcohol abuse, uncomplicated: Secondary | ICD-10-CM

## 2015-09-10 DIAGNOSIS — G3184 Mild cognitive impairment, so stated: Secondary | ICD-10-CM | POA: Diagnosis not present
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
## 2015-09-10 NOTE — Addendum Note (Signed)
Addended by: Glenford Bayley B on: 09/10/2015 03:04 PM   Modules accepted: Orders

## 2015-09-11 LAB — CUP PACEART REMOTE DEVICE CHECK
Battery Voltage: 2.92 V
Brady Statistic AP VS Percent: 1 %
Brady Statistic AS VP Percent: 2.8 %
Brady Statistic RA Percent Paced: 96 %
HighPow Impedance: 78 Ohm
HighPow Impedance: 78 Ohm
Implantable Lead Implant Date: 20131223
Implantable Lead Location: 753858
Implantable Lead Location: 753859
Implantable Lead Location: 753860
Implantable Lead Model: 4136
Lead Channel Impedance Value: 1175 Ohm
Lead Channel Impedance Value: 490 Ohm
Lead Channel Pacing Threshold Amplitude: 0.75 V
Lead Channel Pacing Threshold Amplitude: 1.25 V
Lead Channel Pacing Threshold Pulse Width: 0.5 ms
Lead Channel Sensing Intrinsic Amplitude: 11.7 mV
Lead Channel Setting Pacing Amplitude: 2 V
Lead Channel Setting Pacing Amplitude: 2 V
Lead Channel Setting Pacing Amplitude: 2.5 V
Lead Channel Setting Pacing Pulse Width: 0.5 ms
MDC IDC LEAD IMPLANT DT: 20130614
MDC IDC LEAD IMPLANT DT: 20131223
MDC IDC LEAD MODEL: 7122
MDC IDC LEAD SERIAL: 29201222
MDC IDC MSMT BATTERY REMAINING LONGEVITY: 41 mo
MDC IDC MSMT BATTERY REMAINING PERCENTAGE: 50 %
MDC IDC MSMT LEADCHNL LV PACING THRESHOLD PULSEWIDTH: 0.5 ms
MDC IDC MSMT LEADCHNL RA PACING THRESHOLD AMPLITUDE: 0.75 V
MDC IDC MSMT LEADCHNL RA SENSING INTR AMPL: 2.5 mV
MDC IDC MSMT LEADCHNL RV IMPEDANCE VALUE: 460 Ohm
MDC IDC MSMT LEADCHNL RV PACING THRESHOLD PULSEWIDTH: 0.5 ms
MDC IDC PG SERIAL: 7057550
MDC IDC SESS DTM: 20170815061107
MDC IDC SET LEADCHNL LV PACING PULSEWIDTH: 0.5 ms
MDC IDC SET LEADCHNL RV SENSING SENSITIVITY: 0.5 mV
MDC IDC STAT BRADY AP VP PERCENT: 96 %
MDC IDC STAT BRADY AS VS PERCENT: 1 %

## 2015-09-14 ENCOUNTER — Other Ambulatory Visit: Payer: Self-pay | Admitting: *Deleted

## 2015-09-14 MED ORDER — PANTOPRAZOLE SODIUM 40 MG PO TBEC: 40.0000 mg | DELAYED_RELEASE_TABLET | Freq: Every day | ORAL | 3 refills | Status: DC

## 2015-09-16 ENCOUNTER — Encounter: Payer: Self-pay | Admitting: Neurology

## 2015-09-16 ENCOUNTER — Ambulatory Visit (INDEPENDENT_AMBULATORY_CARE_PROVIDER_SITE_OTHER): Payer: Medicare Other | Admitting: Neurology

## 2015-09-16 VITALS — BP 116/64 | HR 79 | Ht 73.0 in | Wt 191.0 lb

## 2015-09-16 DIAGNOSIS — F32A Depression, unspecified: Secondary | ICD-10-CM

## 2015-09-16 DIAGNOSIS — I255 Ischemic cardiomyopathy: Secondary | ICD-10-CM

## 2015-09-16 DIAGNOSIS — G3184 Mild cognitive impairment, so stated: Secondary | ICD-10-CM

## 2015-09-16 DIAGNOSIS — F1020 Alcohol dependence, uncomplicated: Secondary | ICD-10-CM

## 2015-09-16 DIAGNOSIS — F329 Major depressive disorder, single episode, unspecified: Secondary | ICD-10-CM | POA: Diagnosis not present

## 2015-09-16 NOTE — Progress Notes (Addendum)
NEUROLOGY FOLLOW UP OFFICE NOTE  Corey [name] [id number]  HISTORY OF PRESENT ILLNESS: Corey [name] is a 79 year old right-handed man with history of paroxysmal atrial fibrillation with PM, MI and CAD, TIA, hypertension, OSA, hyperlipidemia, alcohol abuse, and history of prostate cancer who follows up for cognitive impairment.  He is accompanied by his wife who supplements history.  UPDATE: He currently is titrating up on Exelon to goal of 6mg  twice daily.   He underwent neuropsychological testing with Dr. Si [name] on 09/04/15.  Results were largely within normal limits and not consistent with dementia, however findings suggested non-amnestic mild cognitive impairment as demonstrated by some deficits in visual-spatial construction and language and immediate encoding.  It was believed that his depression, psychosocial stressors and alcohol consumption played a large role in cognitive difficulties.   HISTORY: His wife and daughters note some mild memory problems for a little while, but has significantly been more noticeable over the past 5 months, since he retired in June.  Memory has been the primary problem.  He was called his daughters several times in a day asking about the same thing.  He still drives but is now mildly disoriented driving around home.  There has been no accidents or near-accidents, but he is more irritable and impulsive, and will drive a little faster and tail gate.  He has poor night vision.  They have a new grandson.  Recently, he forgot about a party for his new grandchild and instead made plans with friends.  He has begun to forget names of family and friends.  He has not had any significant change in personality other than irritability.  Besides the aggressive driving, he uses foul language a little more.  He does not act inappropriately.  He does feel depressed, as he has been going through an adjustment since retiring.  He has had more falls recently.  One time, the rug  slipped from underneath him.  Another time, he tripped over a hose at the gas station.  He also had experienced bowel incontinence.  He has not had any problems at work prior to retirement.  He has not had hallucinations or delusions. He has not had difficulty performing everyday tasks.  He notes some problems but doesn't think it is as bad as his family makes it out to be.     He worked in [employer] for many years.  Since retirement, he typically doesn't do too much.  He will sit around.  He doesn't watch much TV during the day.  He reads the newspaper daily and able to retain information.  He is able to stay focus.  He and his wife are not as social as they used to be.     His mother had dementia.    Over the past couple of years, he has had several operations, including pacemaker and back surgery.  He attributes these changes to his multiple surgeries.  He also has depression.  He has been depressed for several years, triggered by several events in his life, such as pain due to multiple back surgeries and the passing of his daughter.  It has worsened particularly over the past two years.  He and his wife traveled around the country this past summer, but he didn't quite seem to enjoy it as much as she should.  He doesn't have any desire to go out and be active or social.  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  His diet is poor, and he is prone to skipping meals.  He does not get much exercise due to chronic back problems.  His sleep is poor.  He wakes up several times during the night.  He takes an Ambien at times.  He is irritable and will blow up at his wife at times.  He is not physically abusive.  He has not been crying.     He did not tolerate Aricept due to vivid dreams.   CT of head from 10/05/14 showed generalized atrophy and chronic small vessel ischemic changes.  PAST MEDICAL HISTORY: Past Medical History:  Diagnosis Date  . Alcohol abuse, episodic  05/19/2008  . ALLERGIC RHINITIS 04/15/2009  . Allergy    YEAR ROUND,TAKE SHOTS  . Alzheimer's dementia    beginning with sx  . Anxiety   . Arthritis   . ASTHMA 11/16/2006   sinusitis-    hx ?yeast patch/white patch on vocal cord as per [facility] 02/25/11-   . Atrial fibrillation ([place]) 11/16/2006   remote CHF related to atrial fib with rapid ventricular response over 25 yrs per office note,  . Cancer [facility]) 2002   prostate cancer  . Chronic systolic CHF (congestive heart failure) (HCC)    echo (10/02/12): EF 123456, grade 1 diastolic dysfunction, trivial AI, mild MR, mild RVE  . COLONIC POLYPS, ADENOMATOUS, HX OF 02/14/2008  . CORONARY ARTERY DISEASE 11/16/2006   LHC (07/2011): LAD with luminal irregularities, proximal circumflex 50%, EF 25%  . Depression   . DIVERTICULOSIS, COLON 02/14/2008  . ESOPHAGEAL STRICTURE 01/28/2008  . GERD 02/14/2008  . Headache(784.0)   . HYPERGLYCEMIA 11/16/2006  . HYPERLIPIDEMIA 02/14/2008  . HYPERTENSION 11/16/2006  . ICD (implantable cardiac defibrillator) in place   . INSOMNIA-SLEEP DISORDER-UNSPEC 02/11/2009  . Lesion of vocal cord    bx begine  . Myocardial infarction ([place]) 1993  . NICM (nonischemic cardiomyopathy) ([place])   . OBSTRUCTIVE SLEEP APNEA 11/10/2008  . 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 Pacemaker    St. Jude  . Pneumonia    hx of  . PROSTATE CANCER, HX OF 02/25/2000  . SLEEP APNEA 10/03/2009   LOV Dr [name] 12/12 in EPIC    Moderate per patient- settings ?6/last sleep study years ago  . Sleep apnea   . Stroke ([place])    Tia  . Symptomatic bradycardia, secondary to sinus node dysfunction 07/08/2011   s/p [facility] PPM by Dr Sallyanne Kuster, upgraded to [place] ICD 12/13 by Dr Rayann Heman ([facility])  . TRANSIENT ISCHEMIC ATTACKS, HX OF 11/16/2006    MEDICATIONS: Current Outpatient Prescriptions on File Prior to Visit  Medication Sig Dispense Refill  . albuterol (ACCUNEB) 0.63 MG/3ML nebulizer solution Inhale into the lungs.    Marland Kitchen albuterol (PROVENTIL  HFA;VENTOLIN HFA) 108 (90 BASE) MCG/ACT inhaler Inhale 1-2 puffs into the lungs every 6 (six) hours as needed for shortness of breath. 1 Inhaler 11  . amiodarone (PACERONE) 200 MG tablet Take 0.5 tablets (100 mg total) by mouth daily. 45 tablet 3  . clonazePAM (KLONOPIN) 0.5 MG tablet TAKE 1/2 TO 2 TABLETS AS NEEDED FOR SLEEP 30 tablet 2  . Cyanocobalamin (B-12) 100 MCG TABS Take by mouth.    . Fluticasone-Salmeterol (ADVAIR DISKUS) 500-50 MCG/DOSE AEPB Inhale into the lungs.    . folic acid (FOLVITE) 1 MG tablet TAKE ONE TABLET EACH DAY 90 tablet 3  . levalbuterol (XOPENEX) 1.25 MG/3ML nebulizer solution Inhale into the lungs.    Marland Kitchen LORazepam (ATIVAN)  1 MG tablet Take by mouth.    . metoprolol succinate (TOPROL-XL) 50 MG 24 hr tablet Take 1/2 tablet by mouth every evening 15 tablet 11  . mometasone (NASONEX) 50 MCG/ACT nasal spray TWO SPRAYS IN EACH NOSTRIL DAILY AS NEEDED FOR ALLERGIES    . mometasone (NASONEX) 50 MCG/ACT nasal spray Place into the nose.    . NONFORMULARY OR COMPOUNDED ITEM Allergy Vaccine 1:10 Given at [facility]    . pantoprazole (PROTONIX) 40 MG tablet Take 1 tablet (40 mg total) by mouth daily. 30 tablet 3  . polyethylene glycol powder (GLYCOLAX/MIRALAX) powder Take 17 g by mouth daily as needed (constipation).     . ramipril (ALTACE) 10 MG capsule Take 2 capsules (20 mg total) by mouth daily. 60 capsule 11  . rivastigmine (EXELON) 1.5 MG capsule Take 1cap in AM and 2caps in PM x14d, then 2caps BID x14d, then 2cap in AM and 3caps in PM x14d, then 4caps BID. 240 capsule 0  . rosuvastatin (CRESTOR) 5 MG tablet TAKE ONE TABLET IN THE EVENING 30 tablet 11  . spironolactone (ALDACTONE) 25 MG tablet TAKE ONE TABLET BY MOUTH ONCE DAILY 30 tablet 11  . warfarin (COUMADIN) 2.5 MG tablet TAKE AS DIRECTED BY COUMADIN CLINIC 30 tablet 1  . warfarin (COUMADIN) 2.5 MG tablet TAKE AS DIRECTED BY COUMADIN CLINIC 35 tablet 1   No current facility-administered medications on file  prior to visit.     ALLERGIES: Allergies  Allergen Reactions  . Hydrocodone Other (See Comments)    Severe panic attacks  . Oxycodone Other (See Comments)    Severe panic attacks    FAMILY HISTORY: Family History  Problem Relation Age of Onset  . Heart attack Father   . Heart attack Brother   . Heart disease Maternal Uncle   . Heart attack Maternal Uncle   . Colon cancer Neg Hx   . Esophageal cancer Neg Hx   . Rectal cancer Neg Hx   . Prostate cancer Neg Hx   . Stomach cancer Neg Hx     SOCIAL HISTORY: Social History   Social History  . Marital status: Married    Spouse name: N/A  . Number of children: N/A  . Years of education: N/A   Occupational History  . Not on file.   Social History Main Topics  . Smoking status: Former Smoker    Packs/day: 1.00    Years: 4.00    Types: Cigarettes    Quit date: 03/22/1964  . Smokeless tobacco: Never Used     Comment: reports smoked socially  . Alcohol use 12.0 oz/week    20 Shots of liquor per week     Comment: 2 mixed drinks daily, some days  . Drug use: No  . Sexual activity: Yes    Partners: Female   Other Topics Concern  . Not on file   Social History Narrative  . No narrative on file    REVIEW OF SYSTEMS: Constitutional: No fevers, chills, or sweats, no generalized fatigue, change in appetite Eyes: No visual changes, double vision, eye pain Ear, nose and throat: No hearing loss, ear pain, nasal congestion, sore throat Cardiovascular: No chest pain, palpitations Respiratory:  No shortness of breath at rest or with exertion, wheezes GastrointestinaI: No nausea, vomiting, diarrhea, abdominal pain, fecal incontinence Genitourinary:  No dysuria, urinary retention or frequency Musculoskeletal:  No neck pain, back pain Integumentary: No rash, pruritus, skin lesions Neurological: as above Psychiatric: No depression, insomnia, anxiety Endocrine:  No palpitations, fatigue, diaphoresis, mood swings, change in  appetite, change in weight, increased thirst Hematologic/Lymphatic:  No purpura, petechiae. Allergic/Immunologic: no itchy/runny eyes, nasal congestion, recent allergic reactions, rashes  PHYSICAL EXAM: Vitals:   09/16/15 1259  BP: 116/64  Pulse: 79   General: No acute distress.  Patient appears well-groomed.   Head:  Normocephalic/atraumatic  IMPRESSION: Non-amnestic mild cognitive impairment, likely related to depression and alcohol use  PLAN: 1.  He will follow up with [facility] 2.  I will discuss diagnosis with Dr. Si [name].  Exelon may not be indicated. 3.  He will start an SSRI, likely Zoloft.  If he continues Exelon, we will wait until he has reached the target dose, or sooner if we discontinue Exelon.  ADDENDUM:  After discussion with Dr. Si [name], I will discontinue the Exelon, as it likely will not be effective.  We will start Zoloft 50mg  daily. 4.  Limit drinking to two standard drinks per day 5.  Should allow wife to drive when he is feeling upset or agitated. 6.  Repeat testing in one year 7.  Follow up with me in 6 months.  26 minutes spent face to face with patient, 100% spent counseling.  Metta Clines, DO  CC:  Scarlette Calico, MD

## 2015-09-16 NOTE — Patient Instructions (Signed)
1.  Continue the Exelon titration for now.  I will contact you with my decision about whether to stop it. 2.  If it is decided to remain on Exelon, once you are on the planned dose, we can start Zoloft.  If we stop the Exelon, we can start Zoloft sooner. 3.  Schedule appointment with [place] 4.  Limit drinking 5.  Follow up in 6 months.

## 2015-09-17 ENCOUNTER — Ambulatory Visit (INDEPENDENT_AMBULATORY_CARE_PROVIDER_SITE_OTHER): Payer: Medicare Other | Admitting: *Deleted

## 2015-09-17 ENCOUNTER — Telehealth: Payer: Self-pay

## 2015-09-17 DIAGNOSIS — J309 Allergic rhinitis, unspecified: Secondary | ICD-10-CM | POA: Diagnosis not present

## 2015-09-17 MED ORDER — SERTRALINE HCL 50 MG PO TABS: 50.0000 mg | ORAL_TABLET | Freq: Every day | ORAL | 3 refills | Status: DC

## 2015-09-17 NOTE — Telephone Encounter (Signed)
-----   Message from Pieter Partridge, DO sent at 09/17/2015  7:44 AM EDT ----- Please let Corey [name] know that I would like him to discontinue the Exelon (he may just stop).  We can start him on Zoloft 50mg  daily for depression.

## 2015-09-17 NOTE — Telephone Encounter (Signed)
Left message on machine for pt to return call to the office.  

## 2015-09-17 NOTE — Telephone Encounter (Signed)
Zoloft sent to Pharmacy. Med list updated.  Pt aware.

## 2015-09-17 NOTE — Telephone Encounter (Signed)
-----   Message from Pieter Partridge, DO sent at 09/17/2015  7:44 AM EDT ----- Please let Mr. Critcher know that I would like him to discontinue the Exelon (he may just stop).  We can start him on Zoloft 50mg  daily for depression.

## 2015-09-29 ENCOUNTER — Ambulatory Visit (INDEPENDENT_AMBULATORY_CARE_PROVIDER_SITE_OTHER): Payer: Medicare Other | Admitting: Psychiatry

## 2015-09-29 DIAGNOSIS — F330 Major depressive disorder, recurrent, mild: Secondary | ICD-10-CM

## 2015-09-30 ENCOUNTER — Encounter: Payer: Self-pay | Admitting: Internal Medicine

## 2015-09-30 ENCOUNTER — Other Ambulatory Visit (INDEPENDENT_AMBULATORY_CARE_PROVIDER_SITE_OTHER): Payer: Medicare Other

## 2015-09-30 ENCOUNTER — Ambulatory Visit (INDEPENDENT_AMBULATORY_CARE_PROVIDER_SITE_OTHER): Payer: Medicare Other | Admitting: Internal Medicine

## 2015-09-30 VITALS — BP 132/82 | HR 67 | Temp 98.0°F | Resp 16 | Ht 72.0 in | Wt 187.4 lb

## 2015-09-30 DIAGNOSIS — I480 Paroxysmal atrial fibrillation: Secondary | ICD-10-CM | POA: Diagnosis not present

## 2015-09-30 DIAGNOSIS — I5022 Chronic systolic (congestive) heart failure: Secondary | ICD-10-CM | POA: Diagnosis not present

## 2015-09-30 DIAGNOSIS — D519 Vitamin B12 deficiency anemia, unspecified: Secondary | ICD-10-CM

## 2015-09-30 DIAGNOSIS — I255 Ischemic cardiomyopathy: Secondary | ICD-10-CM | POA: Diagnosis not present

## 2015-09-30 DIAGNOSIS — R202 Paresthesia of skin: Secondary | ICD-10-CM

## 2015-09-30 DIAGNOSIS — I10 Essential (primary) hypertension: Secondary | ICD-10-CM

## 2015-09-30 DIAGNOSIS — I2510 Atherosclerotic heart disease of native coronary artery without angina pectoris: Secondary | ICD-10-CM

## 2015-09-30 DIAGNOSIS — R200 Anesthesia of skin: Secondary | ICD-10-CM | POA: Insufficient documentation

## 2015-09-30 LAB — CBC WITH DIFFERENTIAL/PLATELET
BASOS ABS: 0 10*3/uL (ref 0.0–0.1)
Basophils Relative: 0.7 % (ref 0.0–3.0)
EOS PCT: 2.1 % (ref 0.0–5.0)
Eosinophils Absolute: 0.1 10*3/uL (ref 0.0–0.7)
HEMATOCRIT: 36.7 % — AB (ref 39.0–52.0)
Hemoglobin: 12.8 g/dL — ABNORMAL LOW (ref 13.0–17.0)
LYMPHS ABS: 1.7 10*3/uL (ref 0.7–4.0)
LYMPHS PCT: 29.7 % (ref 12.0–46.0)
MCHC: 35 g/dL (ref 30.0–36.0)
MCV: 91 fl (ref 78.0–100.0)
MONOS PCT: 11.9 % (ref 3.0–12.0)
Monocytes Absolute: 0.7 10*3/uL (ref 0.1–1.0)
NEUTROS ABS: 3.2 10*3/uL (ref 1.4–7.7)
Neutrophils Relative %: 55.6 % (ref 43.0–77.0)
PLATELETS: 216 10*3/uL (ref 150.0–400.0)
RBC: 4.03 Mil/uL — AB (ref 4.22–5.81)
RDW: 12.1 % (ref 11.5–15.5)
WBC: 5.8 10*3/uL (ref 4.0–10.5)

## 2015-09-30 LAB — COMPREHENSIVE METABOLIC PANEL
ALBUMIN: 4.1 g/dL (ref 3.5–5.2)
ALK PHOS: 73 U/L (ref 39–117)
ALT: 33 U/L (ref 0–53)
AST: 35 U/L (ref 0–37)
BUN: 25 mg/dL — AB (ref 6–23)
CALCIUM: 9.2 mg/dL (ref 8.4–10.5)
CO2: 31 mEq/L (ref 19–32)
CREATININE: 0.98 mg/dL (ref 0.40–1.50)
Chloride: 106 mEq/L (ref 96–112)
GFR: 78.66 mL/min (ref >60.00)
Glucose, Bld: 88 mg/dL (ref 70–99)
Potassium: 4.9 mEq/L (ref 3.5–5.1)
SODIUM: 139 meq/L (ref 135–145)
TOTAL PROTEIN: 7.2 g/dL (ref 6.0–8.3)
Total Bilirubin: 0.5 mg/dL (ref 0.2–1.2)

## 2015-09-30 NOTE — Patient Instructions (Signed)
Hypertension Hypertension, commonly called high blood pressure, is when the force of blood pumping through your arteries is too strong. Your arteries are the blood vessels that carry blood from your heart throughout your body. A blood pressure reading consists of a higher number over a lower number, such as 110/72. The higher number (systolic) is the pressure inside your arteries when your heart pumps. The lower number (diastolic) is the pressure inside your arteries when your heart relaxes. Ideally you want your blood pressure below 120/80. Hypertension forces your heart to work harder to pump blood. Your arteries may become narrow or stiff. Having untreated or uncontrolled hypertension can cause heart attack, stroke, kidney disease, and other problems. RISK FACTORS Some risk factors for high blood pressure are controllable. Others are not.  Risk factors you cannot control include:   Race. You may be at higher risk if you are African American.  Age. Risk increases with age.  Gender. Men are at higher risk than women before age 45 years. After age 65, women are at higher risk than men. Risk factors you can control include:  Not getting enough exercise or physical activity.  Being overweight.  Getting too much fat, sugar, calories, or salt in your diet.  Drinking too much alcohol. SIGNS AND SYMPTOMS Hypertension does not usually cause signs or symptoms. Extremely high blood pressure (hypertensive crisis) may cause headache, anxiety, shortness of breath, and nosebleed. DIAGNOSIS To check if you have hypertension, your health care provider will measure your blood pressure while you are seated, with your arm held at the level of your heart. It should be measured at least twice using the same arm. Certain conditions can cause a difference in blood pressure between your right and left arms. A blood pressure reading that is higher than normal on one occasion does not mean that you need treatment. If  it is not clear whether you have high blood pressure, you may be asked to return on a different day to have your blood pressure checked again. Or, you may be asked to monitor your blood pressure at home for 1 or more weeks. TREATMENT Treating high blood pressure includes making lifestyle changes and possibly taking medicine. Living a healthy lifestyle can help lower high blood pressure. You may need to change some of your habits. Lifestyle changes may include:  Following the DASH diet. This diet is high in fruits, vegetables, and whole grains. It is low in salt, red meat, and added sugars.  Keep your sodium intake below 2,300 mg per day.  Getting at least 30-45 minutes of aerobic exercise at least 4 times per week.  Losing weight if necessary.  Not smoking.  Limiting alcoholic beverages.  Learning ways to reduce stress. Your health care provider may prescribe medicine if lifestyle changes are not enough to get your blood pressure under control, and if one of the following is true:  You are 18-59 years of age and your systolic blood pressure is above 140.  You are 60 years of age or older, and your systolic blood pressure is above 150.  Your diastolic blood pressure is above 90.  You have diabetes, and your systolic blood pressure is over 140 or your diastolic blood pressure is over 90.  You have kidney disease and your blood pressure is above 140/90.  You have heart disease and your blood pressure is above 140/90. Your personal target blood pressure may vary depending on your medical conditions, your age, and other factors. HOME CARE INSTRUCTIONS    Have your blood pressure rechecked as directed by your health care provider.   Take medicines only as directed by your health care provider. Follow the directions carefully. Blood pressure medicines must be taken as prescribed. The medicine does not work as well when you skip doses. Skipping doses also puts you at risk for  problems.  Do not smoke.   Monitor your blood pressure at home as directed by your health care provider. SEEK MEDICAL CARE IF:   You think you are having a reaction to medicines taken.  You have recurrent headaches or feel dizzy.  You have swelling in your ankles.  You have trouble with your vision. SEEK IMMEDIATE MEDICAL CARE IF:  You develop a severe headache or confusion.  You have unusual weakness, numbness, or feel faint.  You have severe chest or abdominal pain.  You vomit repeatedly.  You have trouble breathing. MAKE SURE YOU:   Understand these instructions.  Will watch your condition.  Will get help right away if you are not doing well or get worse.   This information is not intended to replace advice given to you by your health care provider. Make sure you discuss any questions you have with your health care provider.   Document Released: 01/10/2005 Document Revised: 05/27/2014 Document Reviewed: 11/02/2012 Elsevier Interactive Patient Education 2016 [employer]  

## 2015-09-30 NOTE — Progress Notes (Signed)
Pre visit review using our clinic review tool, if applicable. No additional management support is needed unless otherwise documented below in the visit note. 

## 2015-09-30 NOTE — Progress Notes (Signed)
Subjective:  Patient ID: Corey [name], male    DOB: 1936-01-12  Age: 80 y.o. MRN: [id number]  CC: Hypertension   HPI Corey [name] presents for follow-up and he complains of feeling poorly for several months with increasing tingling around both sides of his upper chest and the top of his shoulders. He has had a few episodes of excessive saliva production with drooling. He has had a few headaches but none recently. He feels weak all over. He has had a few episodes of ataxia.  Outpatient Medications Prior to Visit  Medication Sig Dispense Refill  . albuterol (ACCUNEB) 0.63 MG/3ML nebulizer solution Inhale into the lungs.    Marland Kitchen albuterol (PROVENTIL HFA;VENTOLIN HFA) 108 (90 BASE) MCG/ACT inhaler Inhale 1-2 puffs into the lungs every 6 (six) hours as needed for shortness of breath. 1 Inhaler 11  . amiodarone (PACERONE) 200 MG tablet Take 0.5 tablets (100 mg total) by mouth daily. 45 tablet 3  . clonazePAM (KLONOPIN) 0.5 MG tablet TAKE 1/2 TO 2 TABLETS AS NEEDED FOR SLEEP 30 tablet 2  . Cyanocobalamin (B-12) 100 MCG TABS Take by mouth.    . Fluticasone-Salmeterol (ADVAIR DISKUS) 500-50 MCG/DOSE AEPB Inhale into the lungs.    . folic acid (FOLVITE) 1 MG tablet TAKE ONE TABLET EACH DAY 90 tablet 3  . levalbuterol (XOPENEX) 1.25 MG/3ML nebulizer solution Inhale into the lungs.    Marland Kitchen LORazepam (ATIVAN) 1 MG tablet Take by mouth.    . metoprolol succinate (TOPROL-XL) 50 MG 24 hr tablet Take 1/2 tablet by mouth every evening 15 tablet 11  . mometasone (NASONEX) 50 MCG/ACT nasal spray TWO SPRAYS IN EACH NOSTRIL DAILY AS NEEDED FOR ALLERGIES    . NONFORMULARY OR COMPOUNDED ITEM Allergy Vaccine 1:10 Given at [facility]    . pantoprazole (PROTONIX) 40 MG tablet Take 1 tablet (40 mg total) by mouth daily. 30 tablet 3  . polyethylene glycol powder (GLYCOLAX/MIRALAX) powder Take 17 g by mouth daily as needed (constipation).     . ramipril (ALTACE) 10 MG capsule Take 2 capsules (20 mg total)  by mouth daily. 60 capsule 11  . rosuvastatin (CRESTOR) 5 MG tablet TAKE ONE TABLET IN THE EVENING 30 tablet 11  . sertraline (ZOLOFT) 50 MG tablet Take 1 tablet (50 mg total) by mouth daily. 30 tablet 3  . spironolactone (ALDACTONE) 25 MG tablet TAKE ONE TABLET BY MOUTH ONCE DAILY 30 tablet 11  . warfarin (COUMADIN) 2.5 MG tablet TAKE AS DIRECTED BY COUMADIN CLINIC 30 tablet 1  . mometasone (NASONEX) 50 MCG/ACT nasal spray Place into the nose.    . warfarin (COUMADIN) 2.5 MG tablet TAKE AS DIRECTED BY COUMADIN CLINIC (Patient not taking: Reported on 09/30/2015) 35 tablet 1   No facility-administered medications prior to visit.     ROS Review of Systems  Constitutional: Negative for appetite change, chills, diaphoresis, fatigue and fever.  HENT: Positive for trouble swallowing. Negative for congestion, sore throat and voice change.   Eyes: Negative.  Negative for visual disturbance.  Respiratory: Negative.  Negative for cough, choking, chest tightness, shortness of breath and stridor.   Cardiovascular: Negative.  Negative for chest pain, palpitations and leg swelling.  Gastrointestinal: Negative.  Negative for abdominal pain, constipation, diarrhea, nausea and vomiting.  Endocrine: Negative.   Genitourinary: Negative.  Negative for difficulty urinating.  Musculoskeletal: Negative for arthralgias, back pain, joint swelling, myalgias and neck pain.  Skin: Negative.   Allergic/Immunologic: Negative.   Neurological: Positive for weakness and headaches.  Negative for tremors, syncope and numbness.    Objective:  BP 132/82   Pulse 67   Temp 98 F (36.7 C) (Oral)   Resp 16   Ht 6' (1.829 m)   Wt 187 lb 6.4 oz (85 kg)   SpO2 96%   BMI 25.42 kg/m   BP Readings from Last 3 Encounters:  09/30/15 132/82  09/16/15 116/64  08/26/15 104/60    Wt Readings from Last 3 Encounters:  09/30/15 187 lb 6.4 oz (85 kg)  09/16/15 191 lb (86.6 kg)  08/26/15 191 lb 1 oz (86.7 kg)    Physical Exam   Constitutional: No distress.  HENT:  Mouth/Throat: Oropharynx is clear and moist. No oropharyngeal exudate.  Eyes: Conjunctivae are normal. Right eye exhibits no discharge. Left eye exhibits no discharge. No scleral icterus.  Neck: Normal range of motion. Neck supple. No JVD present. No tracheal deviation present. No thyromegaly present.  Cardiovascular: Normal rate, regular rhythm, normal heart sounds and intact distal pulses.  Exam reveals no gallop and no friction rub.   No murmur heard. EKG ----  Electronic ventricular pacemaker  Pacemaker ECG, No further analysis  Pulmonary/Chest: Effort normal and breath sounds normal. No stridor. No respiratory distress. He has no wheezes. He has no rales. He exhibits no tenderness.  Abdominal: Soft. Bowel sounds are normal. He exhibits no distension. There is no tenderness. There is no rebound and no guarding.  Musculoskeletal: Normal range of motion. He exhibits no edema, tenderness or deformity.  Lymphadenopathy:    He has no cervical adenopathy.  Neurological: He is alert. He has normal strength. He displays no atrophy and no tremor. No cranial nerve deficit or sensory deficit. He exhibits normal muscle tone. He displays a negative Romberg sign. He displays no seizure activity. Coordination and gait normal. He displays no Babinski's sign on the right side. He displays no Babinski's sign on the left side.  Reflex Scores:      Tricep reflexes are 2+ on the right side and 2+ on the left side.      Bicep reflexes are 2+ on the right side and 2+ on the left side.      Brachioradialis reflexes are 1+ on the right side and 1+ on the left side.      Patellar reflexes are 1+ on the right side and 1+ on the left side.      Achilles reflexes are 1+ on the right side and 1+ on the left side. He has symmetrical hyperreflexia in both upper extremities. His gait and speech are normal today. I do not detect any ataxia and his Romberg test is normal.  Skin: He is  not diaphoretic.    Lab Results  Component Value Date   WBC 5.8 09/30/2015   HGB 12.8 (L) 09/30/2015   HCT 36.7 (L) 09/30/2015   PLT 216.0 09/30/2015   GLUCOSE 88 09/30/2015   CHOL 140 12/17/2014   TRIG 128.0 12/17/2014   HDL 46.40 12/17/2014   LDLCALC 68 12/17/2014   ALT 33 09/30/2015   AST 35 09/30/2015   NA 139 09/30/2015   K 4.9 09/30/2015   CL 106 09/30/2015   CREATININE 0.98 09/30/2015   BUN 25 (H) 09/30/2015   CO2 31 09/30/2015   TSH 0.03 (L) 09/30/2015   PSA 0.02 (L) 11/13/2013   INR 3.8 09/03/2015   HGBA1C 5.5 11/18/2014    Nm Hepato W/eject Fract  Result Date: 03/10/2015 CLINICAL DATA:  Abdominal pain for 2 weeks EXAM:  NUCLEAR MEDICINE HEPATOBILIARY IMAGING WITH GALLBLADDER EF TECHNIQUE: Sequential images of the abdomen were obtained out to 60 minutes following intravenous administration of radiopharmaceutical. After oral ingestion of 8 ounces of Ensure, gallbladder ejection fraction was determined. RADIOPHARMACEUTICALS:  5.2 mCi Tc-43m Choletec IV COMPARISON:  None. FINDINGS: Gallbladder activity occurs after 10 minutes. Small bowel activity occurs after 6 minutes. Gallbladder ejection fraction is 25%. Normal is considered 33% or greater. IMPRESSION: Cystic and common bile ducts are patent. Abnormally low gallbladder ejection fraction. Gallbladder ejection fraction is 25% with normal considered 33% or greater. Electronically Signed   By: Marybelle Killings M.D.   On: 03/10/2015 14:44    Assessment & Plan:   Corey [name] was seen today for hypertension.  Diagnoses and all orders for this visit:  Atherosclerosis of native coronary artery without angina pectoris, unspecified whether native or transplanted heart- he has had no recent episodes of angina  Paroxysmal atrial fibrillation (HCC)- his heart rhythm is paced, he has good rate and rhythm control, will continue anticoagulation with Coumadin  Chronic systolic heart failure ([place])- he has a normal volume status today, will  continue diuresis with spironolactone -     EKG 12-Lead  Essential hypertension- his blood pressure is well-controlled, electrolytes and renal function are stable, his TSH is suppressed so I've asked him to return for a recheck of the TSH and other thyroid function studies. -     Cancel: Comprehensive metabolic panel; Future -     EKG 12-Lead -     Comprehensive metabolic panel; Future -     TSH; Future  B12 deficiency anemia- improvement noted -     Cancel: CBC with Differential/Platelet; Future -     CBC with Differential/Platelet; Future -     Vitamin B12; Future -     Folate; Future  Numbness and tingling- He has a defibrillator so an MRI cannot be done, will get a CT scan of the brain and cervical spine to screen for demyelination, nerve impingement, spinal stenosis, CVA, mass. His labs show a slightly depressed TSH so Iwill screen him for hyperthyroidism but the remainder of his labs are negative for any metabolic or secondary causes. His B12 level is supratherapeutic. -     Cancel: MR Brain Wo Contrast; Future -     Cancel: MR Cervical Spine Wo Contrast; Future -     CT HEAD WO CONTRAST; Future -     CT CERVICAL SPINE WO CONTRAST; Future   I am having Mr. Dischler maintain his mometasone, polyethylene glycol powder, albuterol, NONFORMULARY OR COMPOUNDED ITEM, folic acid, 0000000, rosuvastatin, metoprolol succinate, clonazePAM, spironolactone, warfarin, amiodarone, ramipril, LORazepam, Fluticasone-Salmeterol, levalbuterol, mometasone, albuterol, pantoprazole, and sertraline.  No orders of the defined types were placed in this encounter.    Follow-up: Return in about 3 weeks (around 10/21/2015).  Scarlette Calico, MD

## 2015-10-01 ENCOUNTER — Encounter: Payer: Self-pay | Admitting: Internal Medicine

## 2015-10-01 ENCOUNTER — Ambulatory Visit (INDEPENDENT_AMBULATORY_CARE_PROVIDER_SITE_OTHER)
Admission: RE | Admit: 2015-10-01 | Discharge: 2015-10-01 | Disposition: A | Discharge status: Home / Self Care | Payer: Medicare Other | Source: Ambulatory Visit | Attending: Internal Medicine | Admitting: Internal Medicine

## 2015-10-01 ENCOUNTER — Ambulatory Visit (INDEPENDENT_AMBULATORY_CARE_PROVIDER_SITE_OTHER): Payer: Medicare Other | Admitting: *Deleted

## 2015-10-01 DIAGNOSIS — R200 Anesthesia of skin: Secondary | ICD-10-CM | POA: Diagnosis not present

## 2015-10-01 DIAGNOSIS — R202 Paresthesia of skin: Secondary | ICD-10-CM | POA: Diagnosis not present

## 2015-10-01 DIAGNOSIS — J309 Allergic rhinitis, unspecified: Secondary | ICD-10-CM

## 2015-10-01 DIAGNOSIS — M5030 Other cervical disc degeneration, unspecified cervical region: Secondary | ICD-10-CM | POA: Diagnosis not present

## 2015-10-01 LAB — FOLATE: Folate: >23.7 ng/mL (ref >5.9)

## 2015-10-01 LAB — VITAMIN B12: Vitamin B-12: 1215 pg/mL — ABNORMAL HIGH (ref 211–911)

## 2015-10-01 LAB — TSH: TSH: 0.03 u[IU]/mL — ABNORMAL LOW (ref 0.35–4.50)

## 2015-10-02 ENCOUNTER — Other Ambulatory Visit: Payer: Self-pay | Admitting: Internal Medicine

## 2015-10-02 DIAGNOSIS — M4802 Spinal stenosis, cervical region: Secondary | ICD-10-CM | POA: Insufficient documentation

## 2015-10-05 ENCOUNTER — Telehealth: Payer: Self-pay | Admitting: Gastroenterology

## 2015-10-05 NOTE — Telephone Encounter (Signed)
Pt wants a call "next week"

## 2015-10-12 ENCOUNTER — Telehealth: Payer: Self-pay | Admitting: Cardiology

## 2015-10-12 NOTE — Telephone Encounter (Signed)
Spoke with pt and reminded pt of remote transmission that is due today. Pt verbalized understanding.   

## 2015-10-13 NOTE — Telephone Encounter (Signed)
Unable to reach pt will wait for further communication from the pt.   

## 2015-10-14 DIAGNOSIS — M47812 Spondylosis without myelopathy or radiculopathy, cervical region: Secondary | ICD-10-CM | POA: Diagnosis not present

## 2015-10-15 ENCOUNTER — Ambulatory Visit (INDEPENDENT_AMBULATORY_CARE_PROVIDER_SITE_OTHER): Payer: Medicare Other | Admitting: *Deleted

## 2015-10-15 DIAGNOSIS — J309 Allergic rhinitis, unspecified: Secondary | ICD-10-CM | POA: Diagnosis not present

## 2015-10-16 NOTE — Progress Notes (Signed)
No ICM remote transmission received for 10/12/2015 and next ICM transmission scheduled for 11/23/2015.

## 2015-10-20 ENCOUNTER — Telehealth: Payer: Self-pay | Admitting: Cardiology

## 2015-10-20 NOTE — Telephone Encounter (Signed)
Spoke w/ pt and requested that he send a manual transmission b/c his home monitor has not updated in at least 8 days.   

## 2015-10-22 ENCOUNTER — Ambulatory Visit (INDEPENDENT_AMBULATORY_CARE_PROVIDER_SITE_OTHER): Payer: Medicare Other

## 2015-10-22 DIAGNOSIS — J309 Allergic rhinitis, unspecified: Secondary | ICD-10-CM | POA: Diagnosis not present

## 2015-10-23 ENCOUNTER — Telehealth: Payer: Self-pay | Admitting: Internal Medicine

## 2015-10-23 DIAGNOSIS — J309 Allergic rhinitis, unspecified: Secondary | ICD-10-CM | POA: Diagnosis not present

## 2015-10-23 NOTE — Telephone Encounter (Addendum)
Allergy Serum Extract Date Mixed: 10/23/15 Vial: 1 Strength: 1:10 Here/Mail/Pick Up: here Mixed By: tbs Last OV: 05/06/15 Pending OV: N/A

## 2015-11-05 ENCOUNTER — Encounter: Payer: Self-pay | Admitting: Internal Medicine

## 2015-11-05 NOTE — Progress Notes (Signed)
Immunization only

## 2015-11-12 ENCOUNTER — Ambulatory Visit (INDEPENDENT_AMBULATORY_CARE_PROVIDER_SITE_OTHER): Payer: Medicare Other | Admitting: Internal Medicine

## 2015-11-12 ENCOUNTER — Ambulatory Visit: Payer: Self-pay

## 2015-11-12 ENCOUNTER — Encounter: Payer: Self-pay | Admitting: Internal Medicine

## 2015-11-12 ENCOUNTER — Ambulatory Visit (INDEPENDENT_AMBULATORY_CARE_PROVIDER_SITE_OTHER): Payer: Medicare Other | Admitting: *Deleted

## 2015-11-12 VITALS — BP 122/72 | HR 66 | Temp 97.8°F | Resp 16 | Ht 72.0 in | Wt 187.8 lb

## 2015-11-12 DIAGNOSIS — I10 Essential (primary) hypertension: Secondary | ICD-10-CM

## 2015-11-12 DIAGNOSIS — J309 Allergic rhinitis, unspecified: Secondary | ICD-10-CM | POA: Diagnosis not present

## 2015-11-12 DIAGNOSIS — I255 Ischemic cardiomyopathy: Secondary | ICD-10-CM | POA: Diagnosis not present

## 2015-11-12 DIAGNOSIS — J449 Chronic obstructive pulmonary disease, unspecified: Secondary | ICD-10-CM | POA: Diagnosis not present

## 2015-11-12 DIAGNOSIS — R059 Cough, unspecified: Secondary | ICD-10-CM | POA: Insufficient documentation

## 2015-11-12 DIAGNOSIS — R05 Cough: Secondary | ICD-10-CM | POA: Diagnosis not present

## 2015-11-12 DIAGNOSIS — I5022 Chronic systolic (congestive) heart failure: Secondary | ICD-10-CM | POA: Diagnosis not present

## 2015-11-12 MED ORDER — UMECLIDINIUM-VILANTEROL 62.5-25 MCG/INH IN AEPB: 1.0000 | INHALATION_SPRAY | Freq: Every day | RESPIRATORY_TRACT | 3 refills | Status: DC

## 2015-11-12 NOTE — Progress Notes (Signed)
Pre visit review using our clinic review tool, if applicable. No additional management support is needed unless otherwise documented below in the visit note. 

## 2015-11-12 NOTE — Progress Notes (Signed)
Subjective:  Patient ID: Corey [name], male    DOB: [birth year]  Age: 80 y.o. MRN: [id number]  CC: Cough and COPD   HPI Corey [name] presents for worsening cough over the last few months. He describes a cough that's productive of copious amounts of clear phlegm. He has also had a few episodes of wheezing and shortness of breath. He saw pulmonary recently and was prescribed albuterol and Advair Diskus but he is either not using the Advair Diskus or it is not helping. It is also worthwhile to note that about 5 months ago a cardiologist put him on high-dose ramipril. He denies chest pain, hemoptysis, fever, chills, weight loss, night sweats, or syncope.  Outpatient Medications Prior to Visit  Medication Sig Dispense Refill  . albuterol (ACCUNEB) 0.63 MG/3ML nebulizer solution Inhale into the lungs.    Marland Kitchen albuterol (PROVENTIL HFA;VENTOLIN HFA) 108 (90 BASE) MCG/ACT inhaler Inhale 1-2 puffs into the lungs every 6 (six) hours as needed for shortness of breath. 1 Inhaler 11  . amiodarone (PACERONE) 200 MG tablet Take 0.5 tablets (100 mg total) by mouth daily. 45 tablet 3  . clonazePAM (KLONOPIN) 0.5 MG tablet TAKE 1/2 TO 2 TABLETS AS NEEDED FOR SLEEP 30 tablet 2  . Cyanocobalamin (B-12) 100 MCG TABS Take by mouth.    . folic acid (FOLVITE) 1 MG tablet TAKE ONE TABLET EACH DAY 90 tablet 3  . levalbuterol (XOPENEX) 1.25 MG/3ML nebulizer solution Inhale into the lungs.    . metoprolol succinate (TOPROL-XL) 50 MG 24 hr tablet Take 1/2 tablet by mouth every evening 15 tablet 11  . mometasone (NASONEX) 50 MCG/ACT nasal spray TWO SPRAYS IN EACH NOSTRIL DAILY AS NEEDED FOR ALLERGIES    . NONFORMULARY OR COMPOUNDED ITEM Allergy Vaccine 1:10 Given at [facility]    . pantoprazole (PROTONIX) 40 MG tablet Take 1 tablet (40 mg total) by mouth daily. 30 tablet 3  . polyethylene glycol powder (GLYCOLAX/MIRALAX) powder Take 17 g by mouth daily as needed (constipation).     . rosuvastatin (CRESTOR) 5  MG tablet TAKE ONE TABLET IN THE EVENING 30 tablet 11  . sertraline (ZOLOFT) 50 MG tablet Take 1 tablet (50 mg total) by mouth daily. 30 tablet 3  . spironolactone (ALDACTONE) 25 MG tablet TAKE ONE TABLET BY MOUTH ONCE DAILY 30 tablet 11  . warfarin (COUMADIN) 2.5 MG tablet TAKE AS DIRECTED BY COUMADIN CLINIC 30 tablet 1  . Fluticasone-Salmeterol (ADVAIR DISKUS) 500-50 MCG/DOSE AEPB Inhale into the lungs.    Marland Kitchen LORazepam (ATIVAN) 1 MG tablet Take by mouth.    . mometasone (NASONEX) 50 MCG/ACT nasal spray Place into the nose.    . ramipril (ALTACE) 10 MG capsule Take 2 capsules (20 mg total) by mouth daily. 60 capsule 11   No facility-administered medications prior to visit.     ROS Review of Systems  Constitutional: Negative for appetite change, chills, diaphoresis, fatigue and fever.  HENT: Negative.  Negative for trouble swallowing.   Eyes: Negative.   Respiratory: Positive for cough, shortness of breath and wheezing. Negative for apnea, choking, chest tightness and stridor.   Cardiovascular: Negative.  Negative for chest pain, palpitations and leg swelling.  Gastrointestinal: Negative.  Negative for abdominal pain, blood in stool, constipation, diarrhea, nausea and vomiting.  Endocrine: Negative.   Genitourinary: Negative.   Musculoskeletal: Negative.  Negative for arthralgias, back pain, myalgias and neck pain.  Skin: Negative.  Negative for color change and rash.  Allergic/Immunologic: Negative.  Neurological: Negative.  Negative for dizziness, weakness and light-headedness.  Hematological: Negative.  Negative for adenopathy. Does not bruise/bleed easily.  Psychiatric/Behavioral: Negative.     Objective:  BP 122/72 (BP Location: Left Arm, Patient Position: Sitting, Cuff Size: Normal)   Pulse 66   Temp 97.8 F (36.6 C) (Oral)   Resp 16   Ht 6' (1.829 m)   Wt 187 lb 12 oz (85.2 kg)   SpO2 97%   BMI 25.46 kg/m   BP Readings from Last 3 Encounters:  11/12/15 122/72    09/30/15 132/82  09/16/15 116/64    Wt Readings from Last 3 Encounters:  11/12/15 187 lb 12 oz (85.2 kg)  09/30/15 187 lb 6.4 oz (85 kg)  09/16/15 191 lb (86.6 kg)    Physical Exam  Constitutional: He is oriented to person, place, and time.  Non-toxic appearance. He does not have a sickly appearance. He does not appear ill. No distress.  HENT:  Mouth/Throat: Oropharynx is clear and moist. No oropharyngeal exudate.  Eyes: Conjunctivae are normal. Right eye exhibits no discharge. Left eye exhibits no discharge. No scleral icterus.  Neck: Normal range of motion. Neck supple. No JVD present. No tracheal deviation present. No thyromegaly present.  Cardiovascular: Normal rate, regular rhythm, normal heart sounds and intact distal pulses.  Exam reveals no gallop and no friction rub.   No murmur heard. Pulmonary/Chest: Effort normal and breath sounds normal. No accessory muscle usage or stridor. No tachypnea. No respiratory distress. He has no decreased breath sounds. He has no wheezes. He has no rhonchi. He has no rales. He exhibits no tenderness.  Abdominal: Soft. Bowel sounds are normal. He exhibits no distension and no mass. There is no tenderness. There is no rebound and no guarding.  Musculoskeletal: Normal range of motion. He exhibits no edema, tenderness or deformity.  Lymphadenopathy:    He has no cervical adenopathy.  Neurological: He is oriented to person, place, and time.  Skin: Skin is warm and dry. No rash noted. He is not diaphoretic. No erythema. No pallor.  Vitals reviewed.   Lab Results  Component Value Date   WBC 5.8 09/30/2015   HGB 12.8 (L) 09/30/2015   HCT 36.7 (L) 09/30/2015   PLT 216.0 09/30/2015   GLUCOSE 88 09/30/2015   CHOL 140 12/17/2014   TRIG 128.0 12/17/2014   HDL 46.40 12/17/2014   LDLCALC 68 12/17/2014   ALT 33 09/30/2015   AST 35 09/30/2015   NA 139 09/30/2015   K 4.9 09/30/2015   CL 106 09/30/2015   CREATININE 0.98 09/30/2015   BUN 25 (H)  09/30/2015   CO2 31 09/30/2015   TSH 0.03 (L) 09/30/2015   PSA 0.02 (L) 11/13/2013   INR 3.8 09/03/2015   HGBA1C 5.5 11/18/2014    Ct Head Wo Contrast  Result Date: 10/01/2015 CLINICAL DATA:  Numbness and tingling in the upper extremity. EXAM: CT HEAD WITHOUT CONTRAST CT CERVICAL SPINE WITHOUT CONTRAST TECHNIQUE: Multidetector CT imaging of the head and cervical spine was performed following the standard protocol without intravenous contrast. Multiplanar CT image reconstructions of the cervical spine were also generated. COMPARISON:  10/05/2014 FINDINGS: CT HEAD FINDINGS Brain: Stable age related cerebral atrophy, ventriculomegaly and periventricular white matter disease. No extra-axial fluid collections are identified. No CT findings for acute hemispheric infarction or intracranial hemorrhage. No mass lesions. The brainstem and cerebellum are normal. 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 Vascular: No hyperdense vessel or unexpected calcification. Skull: No skull  fracture or bone lesion. Sinuses/Orbits: The paranasal sinuses and mastoid air cells are clear. The globes are intact. Other: No significant scalp or soft tissue abnormality. CT CERVICAL SPINE FINDINGS Alignment: Stable advanced degenerative cervical spondylosis with multilevel disc disease and facet disease but the overall alignment is maintained. Skull base and vertebrae: No acute fracture. No primary bone lesion or focal pathologic process. Soft tissues and spinal canal: No prevertebral fluid or swelling. No visible canal hematoma. Disc levels: Multilevel disc disease and facet disease but no acute large disc herniation. Multilevel foraminal stenosis due to facet disease and uncinate spurring appears stable. Upper chest: The lung apices are clear. Other: None IMPRESSION: 1. Stable age related cerebral atrophy, ventriculomegaly, periventricular white matter disease and remote infarcts. 2. No acute intracranial  findings for mass lesion. 3. Stable degenerative cervical spondylosis with multilevel disc disease and facet disease. No acute bony findings or canal compromise. 4. Multilevel foraminal stenosis due to uncinate spurring and facet disease. Electronically Signed   By: Marijo Sanes M.D.   On: 10/01/2015 18:05   Ct Cervical Spine Wo Contrast  Result Date: 10/01/2015 CLINICAL DATA:  Numbness and tingling in the upper extremity. EXAM: CT HEAD WITHOUT CONTRAST CT CERVICAL SPINE WITHOUT CONTRAST TECHNIQUE: Multidetector CT imaging of the head and cervical spine was performed following the standard protocol without intravenous contrast. Multiplanar CT image reconstructions of the cervical spine were also generated. COMPARISON:  10/05/2014 FINDINGS: CT HEAD FINDINGS Brain: Stable age related cerebral atrophy, ventriculomegaly and periventricular white matter disease. No extra-axial fluid collections are identified. No CT findings for acute hemispheric infarction or intracranial hemorrhage. No mass lesions. The brainstem and cerebellum are normal. 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 Vascular: No hyperdense vessel or unexpected calcification. Skull: No skull fracture or bone lesion. Sinuses/Orbits: The paranasal sinuses and mastoid air cells are clear. The globes are intact. Other: No significant scalp or soft tissue abnormality. CT CERVICAL SPINE FINDINGS Alignment: Stable advanced degenerative cervical spondylosis with multilevel disc disease and facet disease but the overall alignment is maintained. Skull base and vertebrae: No acute fracture. No primary bone lesion or focal pathologic process. Soft tissues and spinal canal: No prevertebral fluid or swelling. No visible canal hematoma. Disc levels: Multilevel disc disease and facet disease but no acute large disc herniation. Multilevel foraminal stenosis due to facet disease and uncinate spurring appears stable. Upper chest: The lung  apices are clear. Other: None IMPRESSION: 1. Stable age related cerebral atrophy, ventriculomegaly, periventricular white matter disease and remote infarcts. 2. No acute intracranial findings for mass lesion. 3. Stable degenerative cervical spondylosis with multilevel disc disease and facet disease. No acute bony findings or canal compromise. 4. Multilevel foraminal stenosis due to uncinate spurring and facet disease. Electronically Signed   By: Marijo Sanes M.D.   On: 10/01/2015 18:05    Assessment & Plan:   Emerzon was seen today for cough and copd.  Diagnoses and all orders for this visit:  Obstructive chronic bronchitis without exacerbation ([place])- since he has a cough that is productive of phlegm I think it would be more beneficial for him to be on a LAMA/LABA rather than an ICS/LABA so I have asked him to change from Advair Diskus to Anoro. I gave him a sample of Anoro and showed him how to use it. He demonstrated proficiency with its use. -     umeclidinium-vilanterol (ANORO ELLIPTA) 62.5-25 MCG/INH AEPB; Inhale 1 puff into the lungs daily.  Cough- I'm suspicious that  the ACE inhibitor is contributing to his cough so I have asked him to stop taking it. Will also change his treatment for COPD as listed above.  Essential hypertension- will stop the ACE inhibitor due to cough, will bring him back in a few weeks and recheck his blood pressure and if needed will switch him to an ARB.  Chronic systolic heart failure ([place])- as above   I have discontinued Corey [name] ramipril, LORazepam, and Fluticasone-Salmeterol. I am also having him start on umeclidinium-vilanterol. Additionally, I am having him maintain his mometasone, polyethylene glycol powder, albuterol, NONFORMULARY OR COMPOUNDED ITEM, folic acid, 0000000, rosuvastatin, metoprolol succinate, clonazePAM, spironolactone, warfarin, amiodarone, levalbuterol, albuterol, pantoprazole, and sertraline.  Meds ordered this encounter  Medications  .  umeclidinium-vilanterol (ANORO ELLIPTA) 62.5-25 MCG/INH AEPB    Sig: Inhale 1 puff into the lungs daily.    Dispense:  90 each    Refill:  3     Follow-up: Return in about 4 weeks (around 12/10/2015).  Scarlette Calico, MD

## 2015-11-12 NOTE — Patient Instructions (Signed)

## 2015-11-16 ENCOUNTER — Ambulatory Visit: Payer: Self-pay

## 2015-11-17 ENCOUNTER — Encounter: Payer: Self-pay | Admitting: Nurse Practitioner

## 2015-11-18 ENCOUNTER — Other Ambulatory Visit: Payer: Self-pay | Admitting: Internal Medicine

## 2015-11-19 ENCOUNTER — Ambulatory Visit (INDEPENDENT_AMBULATORY_CARE_PROVIDER_SITE_OTHER): Payer: Medicare Other

## 2015-11-19 ENCOUNTER — Ambulatory Visit (INDEPENDENT_AMBULATORY_CARE_PROVIDER_SITE_OTHER): Payer: Medicare Other | Admitting: *Deleted

## 2015-11-19 DIAGNOSIS — J309 Allergic rhinitis, unspecified: Secondary | ICD-10-CM | POA: Diagnosis not present

## 2015-11-19 DIAGNOSIS — Z23 Encounter for immunization: Secondary | ICD-10-CM

## 2015-11-23 ENCOUNTER — Ambulatory Visit (INDEPENDENT_AMBULATORY_CARE_PROVIDER_SITE_OTHER): Payer: Medicare Other

## 2015-11-23 ENCOUNTER — Telehealth: Payer: Self-pay

## 2015-11-23 DIAGNOSIS — Z95810 Presence of automatic (implantable) cardiac defibrillator: Secondary | ICD-10-CM

## 2015-11-23 DIAGNOSIS — I5022 Chronic systolic (congestive) heart failure: Secondary | ICD-10-CM

## 2015-11-23 NOTE — Progress Notes (Signed)
EPIC Encounter for ICM Monitoring  Patient Name: Corey [name] is a 80 y.o. male Date: 11/23/2015 Primary Care Physican: Scarlette Calico, MD Primary Cardiologist: Truitt Merle, NP Electrophysiologist: Allred Dry Weight:    unknown Bi-V Pacing:  99%                   Attempted ICM call and unable to reach.  Transmission reviewed.   Thoracic impedance abnormal suggesting fluid accumulation since 11/22/2015.  Recommendations:  NONE - Unable to reach  Follow-up plan: ICM clinic phone appointment on 12/01/2015 to recheck fluid levels.  Office appointment with Truitt Merle, NP on 12/01/2015.  Copy of ICM check sent to device physician.   ICM trend: 11/23/2015       Rosalene Billings, RN 11/23/2015 10:36 AM

## 2015-11-23 NOTE — Telephone Encounter (Signed)
Remote ICM transmission received.  Attempted patient call and left message to return call.   

## 2015-11-24 NOTE — Progress Notes (Signed)
Returned patient call on cell phone as requested.  Left detailed message and encouraged to call if he is having any fluid symptoms.  Next ICM remote transmission scheduled for 12/01/2015 which is same as office appointment with Truitt Merle, NP

## 2015-11-26 ENCOUNTER — Ambulatory Visit (INDEPENDENT_AMBULATORY_CARE_PROVIDER_SITE_OTHER): Payer: Medicare Other | Admitting: *Deleted

## 2015-11-26 DIAGNOSIS — J309 Allergic rhinitis, unspecified: Secondary | ICD-10-CM

## 2015-12-01 ENCOUNTER — Ambulatory Visit (INDEPENDENT_AMBULATORY_CARE_PROVIDER_SITE_OTHER): Payer: Medicare Other | Admitting: Nurse Practitioner

## 2015-12-01 ENCOUNTER — Encounter: Payer: Self-pay | Admitting: Nurse Practitioner

## 2015-12-01 ENCOUNTER — Ambulatory Visit (INDEPENDENT_AMBULATORY_CARE_PROVIDER_SITE_OTHER): Payer: Medicare Other | Admitting: *Deleted

## 2015-12-01 ENCOUNTER — Ambulatory Visit (INDEPENDENT_AMBULATORY_CARE_PROVIDER_SITE_OTHER): Payer: Medicare Other

## 2015-12-01 VITALS — BP 138/84 | HR 68 | Ht 72.0 in | Wt 191.4 lb

## 2015-12-01 DIAGNOSIS — I5022 Chronic systolic (congestive) heart failure: Secondary | ICD-10-CM | POA: Diagnosis not present

## 2015-12-01 DIAGNOSIS — Z7901 Long term (current) use of anticoagulants: Secondary | ICD-10-CM

## 2015-12-01 DIAGNOSIS — I4891 Unspecified atrial fibrillation: Secondary | ICD-10-CM

## 2015-12-01 DIAGNOSIS — I428 Other cardiomyopathies: Secondary | ICD-10-CM | POA: Diagnosis not present

## 2015-12-01 DIAGNOSIS — Z95810 Presence of automatic (implantable) cardiac defibrillator: Secondary | ICD-10-CM

## 2015-12-01 DIAGNOSIS — I480 Paroxysmal atrial fibrillation: Secondary | ICD-10-CM

## 2015-12-01 DIAGNOSIS — Z5181 Encounter for therapeutic drug level monitoring: Secondary | ICD-10-CM

## 2015-12-01 DIAGNOSIS — I255 Ischemic cardiomyopathy: Secondary | ICD-10-CM

## 2015-12-01 LAB — BASIC METABOLIC PANEL
BUN: 17 mg/dL (ref 7–25)
CO2: 28 mmol/L (ref 20–31)
Calcium: 9 mg/dL (ref 8.6–10.3)
Chloride: 103 mmol/L (ref 98–110)
Creat: 1.14 mg/dL (ref 0.70–1.18)
Glucose, Bld: 108 mg/dL — ABNORMAL HIGH (ref 65–99)
Potassium: 5 mmol/L (ref 3.5–5.3)
Sodium: 138 mmol/L (ref 135–146)

## 2015-12-01 LAB — HEPATIC FUNCTION PANEL
ALT: 34 U/L (ref 9–46)
AST: 43 U/L — ABNORMAL HIGH (ref 10–35)
Albumin: 4 g/dL (ref 3.6–5.1)
Alkaline Phosphatase: 69 U/L (ref 40–115)
Bilirubin, Direct: 0.1 mg/dL (ref <=0.2)
Indirect Bilirubin: 0.4 mg/dL (ref 0.2–1.2)
Total Bilirubin: 0.5 mg/dL (ref 0.2–1.2)
Total Protein: 6.7 g/dL (ref 6.1–8.1)

## 2015-12-01 LAB — CBC
HCT: 40.7 % (ref 38.5–50.0)
Hemoglobin: 13.4 g/dL (ref 13.2–17.1)
MCH: 30.4 pg (ref 27.0–33.0)
MCHC: 32.9 g/dL (ref 32.0–36.0)
MCV: 92.3 fL (ref 80.0–100.0)
MPV: 10 fL (ref 7.5–12.5)
Platelets: 201 10*3/uL (ref 140–400)
RBC: 4.41 MIL/uL (ref 4.20–5.80)
RDW: 13.5 % (ref 11.0–15.0)
WBC: 5 10*3/uL (ref 3.8–10.8)

## 2015-12-01 LAB — LIPID PANEL
Cholesterol: 183 mg/dL (ref <200)
HDL: 42 mg/dL (ref >40)
LDL Cholesterol: 104 mg/dL — ABNORMAL HIGH
Total CHOL/HDL Ratio: 4.4 Ratio (ref <5.0)
Triglycerides: 184 mg/dL — ABNORMAL HIGH (ref <150)
VLDL: 37 mg/dL — ABNORMAL HIGH (ref <30)

## 2015-12-01 LAB — POCT INR: INR: 1.7

## 2015-12-01 LAB — BRAIN NATRIURETIC PEPTIDE: Brain Natriuretic Peptide: 5.5 pg/mL (ref <100)

## 2015-12-01 LAB — TSH: TSH: 0.19 mIU/L — ABNORMAL LOW (ref 0.40–4.50)

## 2015-12-01 NOTE — Progress Notes (Signed)
CARDIOLOGY OFFICE NOTE  Date:  12/01/2015    Corey [name] Date of Birth: 08-04-1935 Medical Record [id number]  PCP:  Corey Calico, MD  Cardiologist:  Corey [name]  Chief Complaint  Patient presents with  . Congestive Heart Failure    6 month check - seen for Dr. Rayann [name]    History of Present Illness: Corey [name] is a 80 y.o. male who presents today for a follow up visit. Seen for Dr. Rayann [name] - former patient of Dr. Lowella [name].   He has known CAD with non obstructive CAD by cath, NICM, chronic systolic HF, alcohol abuse, Alzheimer's, prostate cancer, HTN, HLD, atrial fib, chronic anticoagulation, underlying PPM/CRT-D, and OSA. Other issues as noted below.   Last seen in May - noted to be trying to cut back on alcohol intake. Cardiac status seemed stable.   Comes in today. Here alone. Tells me he is not clear as to why he is here. Several complaints however. He notes more fatigue - especially over this past year. Not really short of breath. No chest pain. He is noting weight gain - had gotten down to about 185 for his daughter's wedding so he could fit in his tux - now up to 191. Using too much salt. At least one alcoholic drink daily - sometimes 2 - does not really clarify the quantity. No awareness of any AF. No shocks. Says he is taking his medicines but does not really know what they are. Has not had his coumadin checked since August.   Past Medical History:  Diagnosis Date  . Alcohol abuse, episodic 05/19/2008  . ALLERGIC RHINITIS 04/15/2009  . Allergy    YEAR ROUND,TAKE SHOTS  . Alzheimer's dementia    beginning with sx  . Anxiety   . Arthritis   . ASTHMA 11/16/2006   sinusitis-    hx ?yeast patch/white patch on vocal cord as per [facility] 02/25/11-   . Atrial fibrillation ([place]) 11/16/2006   remote CHF related to atrial fib with rapid ventricular response over 25 yrs per office note,  . [facility]) 2002   prostate cancer  . Chronic systolic CHF (congestive heart  failure) (HCC)    echo (10/02/12): EF 123456, grade 1 diastolic dysfunction, trivial AI, mild MR, mild RVE  . COLONIC POLYPS, ADENOMATOUS, HX OF 02/14/2008  . CORONARY ARTERY DISEASE 11/16/2006   LHC (07/2011): LAD with luminal irregularities, proximal circumflex 50%, EF 25%  . Depression   . DIVERTICULOSIS, COLON 02/14/2008  . ESOPHAGEAL STRICTURE 01/28/2008  . GERD 02/14/2008  . Headache(784.0)   . HYPERGLYCEMIA 11/16/2006  . HYPERLIPIDEMIA 02/14/2008  . HYPERTENSION 11/16/2006  . ICD (implantable cardiac defibrillator) in place   . INSOMNIA-SLEEP DISORDER-UNSPEC 02/11/2009  . Lesion of vocal cord    bx begine  . Myocardial infarction 1993  . NICM (nonischemic cardiomyopathy) ([place])   . OBSTRUCTIVE SLEEP APNEA 11/10/2008  . 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 Pacemaker    St. Jude  . Pneumonia    hx of  . PROSTATE CANCER, HX OF 02/25/2000  . SLEEP APNEA 10/03/2009   LOV Dr [name] 12/12 in EPIC    Moderate per patient- settings ?6/last sleep study years ago  . Sleep apnea   . Stroke ([place])    Tia  . Symptomatic bradycardia, secondary to sinus node dysfunction 07/08/2011   s/p [facility] Scientific PPM by Dr Sallyanne Kuster, upgraded to [place] ICD 12/13 by Dr Corey [name] (S[facility])  . TRANSIENT ISCHEMIC ATTACKS, HX OF 11/16/2006  Past Surgical History:  Procedure Laterality Date  . BACK SURGERY    . BI-VENTRICULAR IMPLANTABLE CARDIOVERTER DEFIBRILLATOR UPGRADE N/A 01/16/2012   Procedure: BI-VENTRICULAR IMPLANTABLE CARDIOVERTER DEFIBRILLATOR UPGRADE;  Surgeon: Thompson Grayer, MD;  Location: [facility] CATH LAB;  Service: Cardiovascular;  Laterality: N/A;  . CARDIAC CATHETERIZATION     several  . CARDIAC DEFIBRILLATOR PLACEMENT  01/16/12   Upgrade to a biventricular SJM ICD by DR Corey [name]  . COLONOSCOPY    . ESOPHAGOGASTRODUODENOSCOPY     with dilitation  . INSERT / REPLACE / REMOVE PACEMAKER    . KNEE ARTHROSCOPY     2 surgeries right and 1 left  . LEFT AND RIGHT HEART CATHETERIZATION WITH CORONARY ANGIOGRAM  N/A 08/18/2011   Procedure: LEFT AND RIGHT HEART CATHETERIZATION WITH CORONARY ANGIOGRAM;  Surgeon: [name], MD;  Location: [facility] CATH LAB;  Service: Cardiovascular;  Laterality: N/A;  . LUMBAR LAMINECTOMY/DECOMPRESSION MICRODISCECTOMY  03/02/2011   Procedure: LUMBAR LAMINECTOMY/DECOMPRESSION MICRODISCECTOMY;  Surgeon: [name], MD;  Location: [facility] ORS;  Service: Orthopedics;  Laterality: N/A;  Decompression Lumbar 4 - Lumbar(X-Ray)  . PACEMAKER INSERTION  06/2011   [employer] PPM implanted by Dr Sallyanne Kuster  . PERMANENT PACEMAKER INSERTION Left 07/08/2011   Procedure: PERMANENT PACEMAKER INSERTION;  Surgeon: [name], MD;  Location: [facility] CATH LAB;  Service: Cardiovascular;  Laterality: Left;  . PROSTATE SURGERY  2/02   prostate cancer  . TONSILECTOMY, ADENOIDECTOMY, BILATERAL MYRINGOTOMY AND TUBES    . TONSILLECTOMY  as child  . TOTAL KNEE ARTHROPLASTY Left 06/04/2012   Procedure: TOTAL KNEE ARTHROPLASTY;  Surgeon: [name], MD;  Location: [facility] ORS;  Service: Orthopedics;  Laterality: Left;  . UVULOPALATOPHARYNGOPLASTY  2013  . vocal cord lesion bx       Medications: Current Outpatient Prescriptions  Medication Sig Dispense Refill  . albuterol (ACCUNEB) 0.63 MG/3ML nebulizer solution Inhale into the lungs.    Marland Kitchen albuterol (PROVENTIL HFA;VENTOLIN HFA) 108 (90 BASE) MCG/ACT inhaler Inhale 1-2 puffs into the lungs every 6 (six) hours as needed for shortness of breath. 1 Inhaler 11  . amiodarone (PACERONE) 200 MG tablet Take 0.5 tablets (100 mg total) by mouth daily. 45 tablet 3  . clonazePAM (KLONOPIN) 0.5 MG tablet TAKE 1/2 TO 2 TABLETS AS NEEDED FOR SLEEP 30 tablet 2  . Cyanocobalamin (B-12) 100 MCG TABS Take by mouth.    . folic acid (FOLVITE) 1 MG tablet TAKE ONE TABLET EACH DAY 90 tablet 3  . levalbuterol (XOPENEX) 1.25 MG/3ML nebulizer solution Inhale into the lungs.    . metoprolol succinate (TOPROL-XL) 50 MG 24 hr tablet Take 1/2 tablet by mouth every evening 15  tablet 11  . mometasone (NASONEX) 50 MCG/ACT nasal spray TWO SPRAYS IN EACH NOSTRIL DAILY AS NEEDED FOR ALLERGIES    . NONFORMULARY OR COMPOUNDED ITEM Allergy Vaccine 1:10 Given at [facility]    . pantoprazole (PROTONIX) 40 MG tablet Take 1 tablet (40 mg total) by mouth daily. 30 tablet 3  . polyethylene glycol powder (GLYCOLAX/MIRALAX) powder Take 17 g by mouth daily as needed (constipation).     . ramipril (ALTACE) 10 MG capsule Take 10 mg by mouth daily.     . rosuvastatin (CRESTOR) 5 MG tablet TAKE ONE TABLET IN THE EVENING 30 tablet 11  . sertraline (ZOLOFT) 50 MG tablet Take 1 tablet (50 mg total) by mouth daily. 30 tablet 3  . spironolactone (ALDACTONE) 25 MG tablet TAKE ONE TABLET BY MOUTH ONCE DAILY 30 tablet 11  . umeclidinium-vilanterol (  ANORO ELLIPTA) 62.5-25 MCG/INH AEPB Inhale 1 puff into the lungs daily. 90 each 3  . warfarin (COUMADIN) 2.5 MG tablet TAKE AS DIRECTED BY COUMADIN CLINIC 30 tablet 1   No current facility-administered medications for this visit.     Allergies: Allergies  Allergen Reactions  . Altace [Ramipril] Cough  . Hydrocodone Other (See Comments)    Severe panic attacks  . Oxycodone Other (See Comments)    Severe panic attacks    Social History: The patient  reports that he quit smoking about 51 years ago. His smoking use included Cigarettes. He has a 4.00 pack-year smoking history. He has never used smokeless tobacco. He reports that he drinks about 12.0 oz of alcohol per week . He reports that he does not use drugs.   Family History: The patient's family history includes Heart attack in his brother, father, and maternal uncle; Heart disease in his maternal uncle.   Review of Systems: Please see the history of present illness.   Otherwise, the review of systems is positive for none.   All other systems are reviewed and negative.   Physical Exam: VS:  BP 138/84   Pulse 68   Ht 6' (1.829 m)   Wt 191 lb 6.4 oz (86.8 kg)   SpO2 94%  Comment: at rest  BMI 25.96 kg/m  .  BMI Body mass index is 25.96 kg/m.  Wt Readings from Last 3 Encounters:  12/01/15 191 lb 6.4 oz (86.8 kg)  11/12/15 187 lb 12 oz (85.2 kg)  09/30/15 187 lb 6.4 oz (85 kg)    General: Pleasant. He is alert and in no acute distress.   HEENT: Normal. Face is ruddy.  Neck: Supple, no JVD, carotid bruits, or masses noted.  Cardiac: Heart tones are distant. No edema.  Respiratory:  Lungs are clear to auscultation bilaterally with normal work of breathing.  GI: Soft and nontender.  MS: No deformity or atrophy. Gait and ROM intact.  Skin: Warm and dry. Color is normal.  Neuro:  Strength and sensation are intact and no gross focal deficits noted.  Psych: Alert, appropriate and with normal affect.   LABORATORY DATA:  EKG:  EKG is not ordered today.  Lab Results  Component Value Date   WBC 5.8 09/30/2015   HGB 12.8 (L) 09/30/2015   HCT 36.7 (L) 09/30/2015   PLT 216.0 09/30/2015   GLUCOSE 88 09/30/2015   CHOL 140 12/17/2014   TRIG 128.0 12/17/2014   HDL 46.40 12/17/2014   LDLCALC 68 12/17/2014   ALT 33 09/30/2015   AST 35 09/30/2015   NA 139 09/30/2015   K 4.9 09/30/2015   CL 106 09/30/2015   CREATININE 0.98 09/30/2015   BUN 25 (H) 09/30/2015   CO2 31 09/30/2015   TSH 0.03 (L) 09/30/2015   PSA 0.02 (L) 11/13/2013   INR 3.8 09/03/2015   HGBA1C 5.5 11/18/2014   Lab Results  Component Value Date   INR 3.8 09/03/2015   INR 3.5 08/14/2015   INR 2.8 06/24/2015    BNP (last 3 results) No results for input(s): BNP in the last 8760 hours.  ProBNP (last 3 results) No results for input(s): PROBNP in the last 8760 hours.   Other Studies Reviewed Today:  Echo Study Conclusions from 09/2012  - Left ventricle: The cavity size was moderately dilated. Wall thickness was normal. Systolic function was moderately reduced. The estimated ejection fraction was in the range of 35% to 40%. Doppler parameters are consistent with abnormal  left ventricular relaxation (grade 1 diastolic dysfunction). - Ventricular septum: Septal motion showed abnormal function and dyssynergy. - Aortic valve: Trivial regurgitation. - Mitral valve: Mild regurgitation. Valve area by pressure half-time: 1.61cm^2. - Right ventricle: The cavity size was mildly dilated. Wall thickness was normal. - Atrial septum: No defect or patent foramen ovale was identified. Impressions:  - Bi-V paced rhythm with significant improvement IN EF (EF 35-40%) snd in intra-ventricular dyssynchrony.(Prior EF (<25%)- since last study of 11/2011. MR also less and LA dimension normal. The findings indicate mild-moderate septal-lateral left ventricular wall dyssynchrony. EF significantly improved since last study prior to Bi-V pacing.   Angiographic Findings from 07/2011:  1. The left main coronary artery is free of significant atherosclerosis and trifurcates into the left anterior descending artery , a large ramus intermedius artery and the dominant left circumflex coronary artery.  2. The left anterior descending artery is a large vessel that reaches the apex and generates two medium size diagonal branches. There is evidence of extensive luminal irregularities and mild proximal calcification, but no hemodynamically meaningful stenoses are seen. The ramus intermedius is large and free of significant obstruction. 3. The left circumflex coronary artery is a large-size vessel dominant vessel that generates three medium size oblique marginal arteries and the posterior descending artery. There is evidence of extensive luminal irregularities and mild proximal calcification. A 50% eccentric stenosis is seen in the proximal vessel, but no other hemodynamically meaningful stenoses are seen. 4. The right coronary artery is a very small-size non dominant vessel, free of significant disease.  5. The left ventricle is mild to moderately dilated. The left  ventricle systolic function is severely decreased with an estimated ejection fraction of 25%. Regional wall motion abnormalities are not seen. No left ventricular thrombus is seen. There is mild mitral insufficiency. The ascending aorta appears normal. There is no aortic valve stenosis by pullback.    IMPRESSIONS:  Although there is evidence of moderate coronary artery disease involving a dominant left circumflex artery this cannot explain the severity of the patient's global left ventricular dysfunction. He appears to have a nonischemic dilated cardiomyopathy of uncertain etiology.  The presence of a left bundle branch block raises the opportunity for biventricular pacing/CRT. However the left bundle branch block appears to be an intermittent finding. Although he has conduction system disease, he does not have frequent ventricular pacing at this point (although this can be anticipated in the future).  RECOMMENDATION:  Followup as scheduled with electrophysiology to discuss device upgrade to a dual-chamber biventricular pacemaker defibrillator. He meets criteria for ICD implantation by the SCD-HFT criteria. He is already on long-term treatment with metoprolol succinate and ramipril, albeit only moderate doses.  Coronary risk fracture modification. Consider switching to warfarin therapy due to history of paroxysmal atrial fibrillation in the setting of severe left ventricular dysfunction.        Assessment/Plan:  1.  Chronic systolic dysfunction - recent remote check indicates fluid retention. Weight is up. Suspect he gets too much salt and drinking too much in the way of alcohol - says he can cut back. I suspect his progressive fatigue is likely HF related as well. I don't really get the impression that he is willing to make changes. Will update his echo. See back in 3 months.   2. Underlying BiV/ICD - noted by Dr. Rayann [name] that he has SJM Fortify Assura advisary noted. Patient is aware. Current  recommendation from SJM is to not replace the device at this time. The patient  is not device dependant.  The patient has not had appropriate device therapy in the past or implanted for secondary prevention.   He is actively remotely monitored and understands the importance of compliance today.   3. ETOH - discussed at length today  4. HTN - BP ok on current regimen  5. AFib - with a CHADS2VASC score is at least 6.  We would therefore favor lifelong anticoagulation. He is maintaining sinus rhythm with amiodarone 100mg  daily. Needs labs today. Has not had his coumadin checked since August - will get INR today. Explained importance of checking monthly.   6. Nonobstructive CAD - managed medically - no chest pain  7. HLD - lab today.   Current medicines are reviewed with the patient today.  The patient does not have concerns regarding medicines other than what has been noted above.  The following changes have been made:  See above.  Labs/ tests ordered today include:    Orders Placed This Encounter  Procedures  . Brain natriuretic peptide  . Basic metabolic panel  . CBC  . Hepatic function panel  . TSH  . Lipid panel  . ECHOCARDIOGRAM COMPLETE     Disposition:   FU with me in 3 months.   Patient is agreeable to this plan and will call if any problems develop in the interim.   Signed: Burtis Junes, RN, ANP-C 12/01/2015 8:52 AM  [facility] [address] [place], [place]  [address] Phone: [phone] Fax: [phone]

## 2015-12-01 NOTE — Progress Notes (Signed)
EPIC Encounter for ICM Monitoring  Patient Name: Corey [name] is a 80 y.o. male Date: 12/01/2015 Primary Care Physican: Scarlette Calico, MD Primary Cardiologist:Lori Servando Snare, NP Electrophysiologist: Allred Dry Weight:unknown Bi-V Pacing: 99%      Patient being seen in office today by Truitt Merle, NP  Thoracic impedance starting to trend down suggesting fluid accumulation starting 11/30/2015 as well as 11/19/2015 to 11/25/2015.  Transmission report given to Truitt Merle, NP for review in office.   Labs: 09/30/2015 Creatinine 0.98, BUN 25, Potassium 4.9, Sodium 139  12/17/2014 Creatinine 1.22, BUN 19, Potassium 4.8, Sodium 138   Recommendations:   In office today and any recommendations will be given by Truitt Merle, NP  Follow-up plan: ICM clinic phone appointment on 12/28/2015.  Copy of ICM check sent device physician.   ICM trend: 12/01/2015       Rosalene Billings, RN 12/01/2015 8:25 AM

## 2015-12-01 NOTE — Patient Instructions (Addendum)
We will be checking the following labs today - BNP, BMET, CBC, HPF, TSH & lipids  We need to get your INR checked today.   Medication Instructions:    Continue with your current medicines.   Testing/Procedures To Be Arranged:  Echocardiogram  Follow-Up:   See me in 3 months    Other Special Instructions:   Try to cut back on your salt and your alcohol  Keep weighing daily    If you need a refill on your cardiac medications before your next appointment, please call your pharmacy.   Call the [place] office at [phone] if you have any questions, problems or concerns.

## 2015-12-03 ENCOUNTER — Ambulatory Visit (INDEPENDENT_AMBULATORY_CARE_PROVIDER_SITE_OTHER): Payer: Medicare Other | Admitting: *Deleted

## 2015-12-03 DIAGNOSIS — J309 Allergic rhinitis, unspecified: Secondary | ICD-10-CM | POA: Diagnosis not present

## 2015-12-04 ENCOUNTER — Other Ambulatory Visit: Payer: Self-pay | Admitting: Internal Medicine

## 2015-12-07 ENCOUNTER — Other Ambulatory Visit (INDEPENDENT_AMBULATORY_CARE_PROVIDER_SITE_OTHER): Payer: Medicare Other

## 2015-12-07 ENCOUNTER — Ambulatory Visit (INDEPENDENT_AMBULATORY_CARE_PROVIDER_SITE_OTHER): Payer: Medicare Other | Admitting: Internal Medicine

## 2015-12-07 ENCOUNTER — Encounter: Payer: Self-pay | Admitting: Internal Medicine

## 2015-12-07 VITALS — BP 122/70 | HR 67 | Temp 97.5°F | Ht 72.0 in | Wt 192.5 lb

## 2015-12-07 DIAGNOSIS — R946 Abnormal results of thyroid function studies: Secondary | ICD-10-CM

## 2015-12-07 DIAGNOSIS — R7989 Other specified abnormal findings of blood chemistry: Secondary | ICD-10-CM | POA: Diagnosis not present

## 2015-12-07 DIAGNOSIS — R945 Abnormal results of liver function studies: Principal | ICD-10-CM

## 2015-12-07 DIAGNOSIS — I255 Ischemic cardiomyopathy: Secondary | ICD-10-CM

## 2015-12-07 DIAGNOSIS — K7010 Alcoholic hepatitis without ascites: Secondary | ICD-10-CM | POA: Insufficient documentation

## 2015-12-07 LAB — HEPATIC FUNCTION PANEL
ALBUMIN: 4.2 g/dL (ref 3.5–5.2)
ALT: 36 U/L (ref 0–53)
AST: 40 U/L — ABNORMAL HIGH (ref 0–37)
Alkaline Phosphatase: 83 U/L (ref 39–117)
Bilirubin, Direct: 0.1 mg/dL (ref 0.0–0.3)
TOTAL PROTEIN: 7.3 g/dL (ref 6.0–8.3)
Total Bilirubin: 0.6 mg/dL (ref 0.2–1.2)

## 2015-12-07 NOTE — Patient Instructions (Signed)
Hyperthyroidism Hyperthyroidism is when the thyroid is too active (overactive). Your thyroid is a large gland that is located in your neck. The thyroid helps to control how your body uses food (metabolism). When your thyroid is overactive, it produces too much of a hormone called thyroxine.  CAUSES Causes of hyperthyroidism may include:  Graves disease. This is when your immune system attacks the thyroid gland. This is the most common cause.  Inflammation of the thyroid gland.  Tumor in the thyroid gland or somewhere else.  Excessive use of thyroid medicines, including:  Prescription thyroid supplement.  Herbal supplements that mimic thyroid hormones.  Solid or fluid-filled lumps within your thyroid gland (thyroid nodules).  Excessive ingestion of iodine. RISK FACTORS  Being male.  Having a family history of thyroid conditions. SIGNS AND SYMPTOMS Signs and symptoms of hyperthyroidism may include:  Nervousness.  Inability to tolerate heat.  Unexplained weight loss.  Diarrhea.  Change in the texture of hair or skin.  Heart skipping beats or making extra beats.  Rapid heart rate.  Loss of menstruation.  Shaky hands.  Fatigue.  Restlessness.  Increased appetite.  Sleep problems.  Enlarged thyroid gland or nodules. DIAGNOSIS  Diagnosis of hyperthyroidism may include:  Medical history and physical exam.  Blood tests.  Ultrasound tests. TREATMENT Treatment may include:  Medicines to control your thyroid.  Surgery to remove your thyroid.  Radiation therapy. HOME CARE INSTRUCTIONS   Take medicines only as directed by your health care provider.  Do not use any tobacco products, including cigarettes, chewing tobacco, or electronic cigarettes. If you need help quitting, ask your health care provider.  Do not exercise or do physical activity until your health care provider approves.  Keep all follow-up appointments as directed by your health care  provider. This is important. SEEK MEDICAL CARE IF:  Your symptoms do not get better with treatment.  You have fever.  You are taking thyroid replacement medicine and you:  Have depression.  Feel mentally and physically slow.  Have weight gain. SEEK IMMEDIATE MEDICAL CARE IF:   You have decreased alertness or a change in your awareness.  You have abdominal pain.  You feel dizzy.  You have a rapid heartbeat.  You have an irregular heartbeat.   This information is not intended to replace advice given to you by your health care provider. Make sure you discuss any questions you have with your health care provider.   Document Released: 01/10/2005 Document Revised: 01/31/2014 Document Reviewed: 05/28/2013 Elsevier Interactive Patient Education 2016 [employer]  

## 2015-12-07 NOTE — Progress Notes (Signed)
Pre visit review using our clinic review tool, if applicable. No additional management support is needed unless otherwise documented below in the visit note. 

## 2015-12-07 NOTE — Progress Notes (Signed)
Subjective:  Patient ID: Corey [name], male    DOB: [birth year]  Age: 80 y.o. MRN: [id number]  CC: Thyroid Problem   HPI Corey [name] presents for follow-up on recent abnormal thyroid testing that showed a slightly suppressed TSH. He feels well today and offers no complaints. The only medication he takes that could affect thyroid function is amiodarone but it usually causes hypothyroidism.  Outpatient Medications Prior to Visit  Medication Sig Dispense Refill  . albuterol (ACCUNEB) 0.63 MG/3ML nebulizer solution Inhale into the lungs.    Marland Kitchen albuterol (PROVENTIL HFA;VENTOLIN HFA) 108 (90 BASE) MCG/ACT inhaler Inhale 1-2 puffs into the lungs every 6 (six) hours as needed for shortness of breath. 1 Inhaler 11  . amiodarone (PACERONE) 200 MG tablet Take 0.5 tablets (100 mg total) by mouth daily. 45 tablet 3  . clonazePAM (KLONOPIN) 0.5 MG tablet TAKE 1/2 TO 2 TABLETS AS NEEDED FOR SLEEP 30 tablet 2  . Cyanocobalamin (B-12) 100 MCG TABS Take by mouth.    . folic acid (FOLVITE) 1 MG tablet TAKE ONE TABLET EACH DAY 90 tablet 3  . levalbuterol (XOPENEX) 1.25 MG/3ML nebulizer solution Inhale into the lungs.    . metoprolol succinate (TOPROL-XL) 50 MG 24 hr tablet Take 1/2 tablet by mouth every evening 15 tablet 11  . mometasone (NASONEX) 50 MCG/ACT nasal spray TWO SPRAYS IN EACH NOSTRIL DAILY AS NEEDED FOR ALLERGIES    . NONFORMULARY OR COMPOUNDED ITEM Allergy Vaccine 1:10 Given at [facility]    . pantoprazole (PROTONIX) 40 MG tablet Take 1 tablet (40 mg total) by mouth daily. 30 tablet 3  . polyethylene glycol powder (GLYCOLAX/MIRALAX) powder Take 17 g by mouth daily as needed (constipation).     . ramipril (ALTACE) 10 MG capsule Take 10 mg by mouth daily.     . rosuvastatin (CRESTOR) 5 MG tablet TAKE ONE TABLET IN THE EVENING 30 tablet 11  . sertraline (ZOLOFT) 50 MG tablet Take 1 tablet (50 mg total) by mouth daily. 30 tablet 3  . spironolactone (ALDACTONE) 25 MG tablet TAKE ONE  TABLET BY MOUTH ONCE DAILY 30 tablet 11  . umeclidinium-vilanterol (ANORO ELLIPTA) 62.5-25 MCG/INH AEPB Inhale 1 puff into the lungs daily. 90 each 3  . warfarin (COUMADIN) 2.5 MG tablet TAKE AS DIRECTED BY COUMADIN CLINIC 30 tablet 1   No facility-administered medications prior to visit.     ROS Review of Systems  Constitutional: Negative.  Negative for activity change, appetite change, diaphoresis, fatigue and unexpected weight change.  HENT: Negative.  Negative for trouble swallowing and voice change.   Eyes: Negative for visual disturbance.  Respiratory: Negative.  Negative for cough, chest tightness, shortness of breath, wheezing and stridor.   Cardiovascular: Negative.  Negative for chest pain, palpitations and leg swelling.  Gastrointestinal: Negative.  Negative for abdominal pain, constipation, diarrhea, nausea and vomiting.  Endocrine: Negative.  Negative for cold intolerance and heat intolerance.  Genitourinary: Negative.  Negative for difficulty urinating.  Musculoskeletal: Negative.  Negative for back pain and myalgias.  Skin: Negative.  Negative for pallor.  Allergic/Immunologic: Negative.   Neurological: Negative.  Negative for dizziness, tremors, weakness, numbness and headaches.  Hematological: Negative.  Negative for adenopathy. Does not bruise/bleed easily.  Psychiatric/Behavioral: Negative.     Objective:  BP 122/70 (BP Location: Left Arm, Patient Position: Sitting, Cuff Size: Normal)   Pulse 67   Temp 97.5 F (36.4 C) (Oral)   Ht 6' (1.829 m)   Wt 192 lb 8 oz (  87.3 kg)   SpO2 94%   BMI 26.11 kg/m   BP Readings from Last 3 Encounters:  12/07/15 122/70  12/01/15 138/84  11/12/15 122/72    Wt Readings from Last 3 Encounters:  12/07/15 192 lb 8 oz (87.3 kg)  12/01/15 191 lb 6.4 oz (86.8 kg)  11/12/15 187 lb 12 oz (85.2 kg)    Physical Exam  Constitutional: He is oriented to person, place, and time. No distress.  HENT:  Mouth/Throat: Oropharynx is  clear and moist. No oropharyngeal exudate.  Eyes: Conjunctivae are normal. Right eye exhibits no discharge. Left eye exhibits no discharge. No scleral icterus.  Neck: Normal range of motion. Neck supple. No JVD present. No tracheal deviation present. No thyromegaly present.  Cardiovascular: Normal rate, regular rhythm, normal heart sounds and intact distal pulses.  Exam reveals no gallop and no friction rub.   No murmur heard. Pulmonary/Chest: Effort normal and breath sounds normal. No stridor. No respiratory distress. He has no wheezes. He has no rales. He exhibits no tenderness.  Abdominal: Soft. Bowel sounds are normal. He exhibits no distension and no mass. There is no tenderness. There is no rebound and no guarding.  Musculoskeletal: Normal range of motion. He exhibits no edema, tenderness or deformity.  Lymphadenopathy:    He has no cervical adenopathy.  Neurological: He is oriented to person, place, and time.  Skin: Skin is warm and dry. No rash noted. He is not diaphoretic. No erythema. No pallor.  Psychiatric: He has a normal mood and affect. His behavior is normal. Judgment and thought content normal.  Vitals reviewed.   Lab Results  Component Value Date   WBC 5.0 12/01/2015   HGB 13.4 12/01/2015   HCT 40.7 12/01/2015   PLT 201 12/01/2015   GLUCOSE 108 (H) 12/01/2015   CHOL 183 12/01/2015   TRIG 184 (H) 12/01/2015   HDL 42 12/01/2015   LDLCALC 104 (H) 12/01/2015   ALT 36 12/07/2015   AST 40 (H) 12/07/2015   NA 138 12/01/2015   K 5.0 12/01/2015   CL 103 12/01/2015   CREATININE 1.14 12/01/2015   BUN 17 12/01/2015   CO2 28 12/01/2015   TSH 0.22 (L) 12/07/2015   PSA 0.02 (L) 11/13/2013   INR 1.7 12/01/2015   HGBA1C 5.5 11/18/2014    Ct Head Wo Contrast  Result Date: 10/01/2015 CLINICAL DATA:  Numbness and tingling in the upper extremity. EXAM: CT HEAD WITHOUT CONTRAST CT CERVICAL SPINE WITHOUT CONTRAST TECHNIQUE: Multidetector CT imaging of the head and cervical spine  was performed following the standard protocol without intravenous contrast. Multiplanar CT image reconstructions of the cervical spine were also generated. COMPARISON:  10/05/2014 FINDINGS: CT HEAD FINDINGS Brain: Stable age related cerebral atrophy, ventriculomegaly and periventricular white matter disease. No extra-axial fluid collections are identified. No CT findings for acute hemispheric infarction or intracranial hemorrhage. No mass lesions. The brainstem and cerebellum are normal. 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 Vascular: No hyperdense vessel or unexpected calcification. Skull: No skull fracture or bone lesion. Sinuses/Orbits: The paranasal sinuses and mastoid air cells are clear. The globes are intact. Other: No significant scalp or soft tissue abnormality. CT CERVICAL SPINE FINDINGS Alignment: Stable advanced degenerative cervical spondylosis with multilevel disc disease and facet disease but the overall alignment is maintained. Skull base and vertebrae: No acute fracture. No primary bone lesion or focal pathologic process. Soft tissues and spinal canal: No prevertebral fluid or swelling. No visible canal hematoma. Disc levels: Multilevel  disc disease and facet disease but no acute large disc herniation. Multilevel foraminal stenosis due to facet disease and uncinate spurring appears stable. Upper chest: The lung apices are clear. Other: None IMPRESSION: 1. Stable age related cerebral atrophy, ventriculomegaly, periventricular white matter disease and remote infarcts. 2. No acute intracranial findings for mass lesion. 3. Stable degenerative cervical spondylosis with multilevel disc disease and facet disease. No acute bony findings or canal compromise. 4. Multilevel foraminal stenosis due to uncinate spurring and facet disease. Electronically Signed   By: Marijo Sanes M.D.   On: 10/01/2015 18:05   Ct Cervical Spine Wo Contrast  Result Date: 10/01/2015 CLINICAL DATA:   Numbness and tingling in the upper extremity. EXAM: CT HEAD WITHOUT CONTRAST CT CERVICAL SPINE WITHOUT CONTRAST TECHNIQUE: Multidetector CT imaging of the head and cervical spine was performed following the standard protocol without intravenous contrast. Multiplanar CT image reconstructions of the cervical spine were also generated. COMPARISON:  10/05/2014 FINDINGS: CT HEAD FINDINGS Brain: Stable age related cerebral atrophy, ventriculomegaly and periventricular white matter disease. No extra-axial fluid collections are identified. No CT findings for acute hemispheric infarction or intracranial hemorrhage. No mass lesions. The brainstem and cerebellum are normal. 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 Vascular: No hyperdense vessel or unexpected calcification. Skull: No skull fracture or bone lesion. Sinuses/Orbits: The paranasal sinuses and mastoid air cells are clear. The globes are intact. Other: No significant scalp or soft tissue abnormality. CT CERVICAL SPINE FINDINGS Alignment: Stable advanced degenerative cervical spondylosis with multilevel disc disease and facet disease but the overall alignment is maintained. Skull base and vertebrae: No acute fracture. No primary bone lesion or focal pathologic process. Soft tissues and spinal canal: No prevertebral fluid or swelling. No visible canal hematoma. Disc levels: Multilevel disc disease and facet disease but no acute large disc herniation. Multilevel foraminal stenosis due to facet disease and uncinate spurring appears stable. Upper chest: The lung apices are clear. Other: None IMPRESSION: 1. Stable age related cerebral atrophy, ventriculomegaly, periventricular white matter disease and remote infarcts. 2. No acute intracranial findings for mass lesion. 3. Stable degenerative cervical spondylosis with multilevel disc disease and facet disease. No acute bony findings or canal compromise. 4. Multilevel foraminal stenosis due to  uncinate spurring and facet disease. Electronically Signed   By: Marijo Sanes M.D.   On: 10/01/2015 18:05    Assessment & Plan:   Breon was seen today for thyroid problem.  Diagnoses and all orders for this visit:  Elevated LFTs- he said chronic, mildly elevated LFTs most consistent with fatty liver disease and his history of alcohol intake. Testing for hepatitis C is negative. -     Hepatitis C antibody; Future -     Hepatic function panel; Future  Low TSH level- he has a slightly depressed TSH but other TFTs are normal and screening for autoimmune diseases negative. 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 I will continue to monitor his TFTs in the future and he will let me know if he develops any symptoms of thyroid disease. -     Thyroid Panel With TSH; Future -     Thyroid peroxidase antibody; Future   I am having Mr. Lawver maintain his mometasone, polyethylene glycol powder, albuterol, NONFORMULARY OR COMPOUNDED ITEM, B-12, metoprolol succinate, clonazePAM, spironolactone, warfarin, amiodarone, levalbuterol, albuterol, pantoprazole, sertraline, umeclidinium-vilanterol, folic acid, ramipril, and rosuvastatin.  No orders of the defined types were placed in this encounter.  Follow-up: Return in about 3 months (around 03/08/2016).  Scarlette Calico, MD

## 2015-12-08 ENCOUNTER — Encounter: Payer: Self-pay | Admitting: Internal Medicine

## 2015-12-08 LAB — HEPATITIS C ANTIBODY: HCV Ab: NEGATIVE

## 2015-12-08 LAB — THYROID PANEL WITH TSH
FREE THYROXINE INDEX: 1.7 (ref 1.4–3.8)
T3 UPTAKE: 29 % (ref 22–35)
T4 TOTAL: 5.7 ug/dL (ref 4.5–12.0)
TSH: 0.22 mIU/L — ABNORMAL LOW (ref 0.40–4.50)

## 2015-12-08 LAB — THYROID PEROXIDASE ANTIBODY: Thyroperoxidase Ab SerPl-aCnc: 1 IU/mL (ref <9)

## 2015-12-10 ENCOUNTER — Ambulatory Visit: Payer: Medicare Other | Admitting: Gastroenterology

## 2015-12-10 ENCOUNTER — Ambulatory Visit (INDEPENDENT_AMBULATORY_CARE_PROVIDER_SITE_OTHER): Payer: Medicare Other | Admitting: *Deleted

## 2015-12-10 DIAGNOSIS — J309 Allergic rhinitis, unspecified: Secondary | ICD-10-CM

## 2015-12-16 ENCOUNTER — Ambulatory Visit: Payer: Medicare Other

## 2015-12-18 ENCOUNTER — Other Ambulatory Visit: Payer: Self-pay | Admitting: Internal Medicine

## 2015-12-22 ENCOUNTER — Ambulatory Visit (HOSPITAL_COMMUNITY): Payer: Medicare Other | Attending: Cardiovascular Disease

## 2015-12-22 ENCOUNTER — Ambulatory Visit (INDEPENDENT_AMBULATORY_CARE_PROVIDER_SITE_OTHER): Payer: Medicare Other | Admitting: *Deleted

## 2015-12-22 ENCOUNTER — Other Ambulatory Visit: Payer: Self-pay

## 2015-12-22 DIAGNOSIS — I4891 Unspecified atrial fibrillation: Secondary | ICD-10-CM

## 2015-12-22 DIAGNOSIS — I77819 Aortic ectasia, unspecified site: Secondary | ICD-10-CM | POA: Diagnosis not present

## 2015-12-22 DIAGNOSIS — I5022 Chronic systolic (congestive) heart failure: Secondary | ICD-10-CM | POA: Diagnosis not present

## 2015-12-22 DIAGNOSIS — I255 Ischemic cardiomyopathy: Secondary | ICD-10-CM

## 2015-12-22 DIAGNOSIS — Z5181 Encounter for therapeutic drug level monitoring: Secondary | ICD-10-CM

## 2015-12-22 DIAGNOSIS — I428 Other cardiomyopathies: Secondary | ICD-10-CM | POA: Diagnosis not present

## 2015-12-22 DIAGNOSIS — Z7901 Long term (current) use of anticoagulants: Secondary | ICD-10-CM

## 2015-12-22 LAB — POCT INR: INR: 1.7

## 2015-12-28 ENCOUNTER — Telehealth: Payer: Self-pay | Admitting: Cardiology

## 2015-12-28 ENCOUNTER — Ambulatory Visit (INDEPENDENT_AMBULATORY_CARE_PROVIDER_SITE_OTHER): Payer: Medicare Other | Admitting: *Deleted

## 2015-12-28 DIAGNOSIS — I5022 Chronic systolic (congestive) heart failure: Secondary | ICD-10-CM | POA: Diagnosis not present

## 2015-12-28 DIAGNOSIS — I495 Sick sinus syndrome: Secondary | ICD-10-CM | POA: Diagnosis not present

## 2015-12-28 DIAGNOSIS — Z95810 Presence of automatic (implantable) cardiac defibrillator: Secondary | ICD-10-CM | POA: Diagnosis not present

## 2015-12-28 LAB — CUP PACEART REMOTE DEVICE CHECK
Battery Remaining Longevity: 37 mo
Brady Statistic AP VS Percent: 1 %
Brady Statistic AS VS Percent: 1 %
Brady Statistic RA Percent Paced: 96 %
HIGH POWER IMPEDANCE MEASURED VALUE: 68 Ohm
HIGH POWER IMPEDANCE MEASURED VALUE: 68 Ohm
Implantable Lead Implant Date: 20130614
Implantable Lead Implant Date: 20131223
Implantable Lead Location: 753859
Implantable Lead Location: 753860
Implantable Lead Serial Number: 29201222
Implantable Pulse Generator Implant Date: 20131223
Lead Channel Impedance Value: 1100 Ohm
Lead Channel Pacing Threshold Amplitude: 0.75 V
Lead Channel Pacing Threshold Amplitude: 0.75 V
Lead Channel Pacing Threshold Amplitude: 1.25 V
Lead Channel Pacing Threshold Pulse Width: 0.5 ms
Lead Channel Sensing Intrinsic Amplitude: 2.6 mV
Lead Channel Setting Pacing Amplitude: 2 V
Lead Channel Setting Pacing Amplitude: 2.5 V
Lead Channel Setting Pacing Pulse Width: 0.5 ms
Lead Channel Setting Sensing Sensitivity: 0.5 mV
MDC IDC LEAD IMPLANT DT: 20131223
MDC IDC LEAD LOCATION: 753858
MDC IDC LEAD MODEL: 4136
MDC IDC LEAD MODEL: 7122
MDC IDC MSMT BATTERY REMAINING PERCENTAGE: 46 %
MDC IDC MSMT BATTERY VOLTAGE: 2.92 V
MDC IDC MSMT LEADCHNL LV PACING THRESHOLD PULSEWIDTH: 0.5 ms
MDC IDC MSMT LEADCHNL RA IMPEDANCE VALUE: 480 Ohm
MDC IDC MSMT LEADCHNL RV IMPEDANCE VALUE: 440 Ohm
MDC IDC MSMT LEADCHNL RV PACING THRESHOLD PULSEWIDTH: 0.5 ms
MDC IDC MSMT LEADCHNL RV SENSING INTR AMPL: 12 mV
MDC IDC PG SERIAL: 7057550
MDC IDC SESS DTM: 20171205050023
MDC IDC SET LEADCHNL RA PACING AMPLITUDE: 2 V
MDC IDC SET LEADCHNL RV PACING PULSEWIDTH: 0.5 ms
MDC IDC STAT BRADY AP VP PERCENT: 95 %
MDC IDC STAT BRADY AS VP PERCENT: 3 %

## 2015-12-28 NOTE — Telephone Encounter (Signed)
LMOVM reminding pt to send remote transmission.   

## 2015-12-29 ENCOUNTER — Telehealth: Payer: Self-pay | Admitting: Cardiology

## 2015-12-29 NOTE — Progress Notes (Addendum)
EPIC Encounter for ICM Monitoring  Patient Name: Corey [name] is a 80 y.o. male Date: 12/29/2015 Primary Care Physican: Scarlette Calico, MD Primary Cardiologist:Lori Servando Snare, NP Electrophysiologist: Allred Dry Weight:unknown Bi-V Pacing: 99%                                               Patient reported he is doing fine today.  Denied any fluid symptoms  Thoracic impedance normal after 12/21/2015.  Has some fluid accumulation during holiday.    Labs: 09/30/2015 Creatinine 0.98, BUN 25, Potassium 4.9, Sodium 139  12/17/2014 Creatinine 1.22, BUN 19, Potassium 4.8, Sodium 138   Recommendations: No changes.  Reinforced to limit low salt food choices to 2000 mg day and limiting fluid intake to < 2 liters per day. Encouraged to call for fluid symptoms.    Follow-up plan: ICM clinic phone appointment on 01/29/2016.  Copy of ICM check sent to device physician.   ICM trend: 12/29/2015       Rosalene Billings, RN 12/29/2015 6:04 PM

## 2015-12-29 NOTE — Telephone Encounter (Signed)
Patient returning your call for his echo results. Please call him on his cell phone [phone].

## 2015-12-29 NOTE — Progress Notes (Signed)
Remote ICD transmission.   

## 2015-12-31 ENCOUNTER — Telehealth: Payer: Self-pay | Admitting: Internal Medicine

## 2015-12-31 ENCOUNTER — Telehealth: Payer: Self-pay | Admitting: Acute Care

## 2015-12-31 ENCOUNTER — Ambulatory Visit (INDEPENDENT_AMBULATORY_CARE_PROVIDER_SITE_OTHER): Payer: Medicare Other | Admitting: Acute Care

## 2015-12-31 ENCOUNTER — Encounter: Payer: Self-pay | Admitting: Acute Care

## 2015-12-31 ENCOUNTER — Ambulatory Visit (INDEPENDENT_AMBULATORY_CARE_PROVIDER_SITE_OTHER)
Admission: RE | Admit: 2015-12-31 | Discharge: 2015-12-31 | Disposition: A | Discharge status: Home / Self Care | Payer: Medicare Other | Source: Ambulatory Visit | Attending: Acute Care | Admitting: Acute Care

## 2015-12-31 VITALS — BP 130/72 | HR 68 | Ht 72.0 in | Wt 195.0 lb

## 2015-12-31 DIAGNOSIS — J302 Other seasonal allergic rhinitis: Secondary | ICD-10-CM

## 2015-12-31 DIAGNOSIS — J471 Bronchiectasis with (acute) exacerbation: Secondary | ICD-10-CM

## 2015-12-31 DIAGNOSIS — J3089 Other allergic rhinitis: Secondary | ICD-10-CM | POA: Diagnosis not present

## 2015-12-31 DIAGNOSIS — I255 Ischemic cardiomyopathy: Secondary | ICD-10-CM

## 2015-12-31 DIAGNOSIS — R05 Cough: Secondary | ICD-10-CM | POA: Diagnosis not present

## 2015-12-31 DIAGNOSIS — J479 Bronchiectasis, uncomplicated: Secondary | ICD-10-CM

## 2015-12-31 MED ORDER — DOXYCYCLINE HYCLATE 100 MG PO TABS: 100.0000 mg | ORAL_TABLET | Freq: Two times a day (BID) | ORAL | 0 refills | Status: DC

## 2015-12-31 NOTE — Assessment & Plan Note (Addendum)
Mild Flare: Plan: Please start taking Claritin 10 mg once daily. We will treat you with Doxycycline 100 mg twice daily x 7 days. Take with full glass of water. Mucinex 2 tablets once  Daily. Take with a full glass of water CXR on the way out today. We will call you with results. Please have INR checked by Dr. Merita Norton Office once antibiotic treatment is completed to ensure you are within range. Follow up with Dr. [name] in 1 month. Please contact office for sooner follow up if symptoms do not improve or worsen or seek emergency care  Pt. Counseled by phone to be hypervigilant of any increased bleeding or bruising as he is on both antibiotic and Coumadin, and to seek emergency medical care for acute bleeding

## 2015-12-31 NOTE — Patient Instructions (Addendum)
It is nice to meet you today. Please start taking Claritin 10 mg once daily. We will treat you with Doxycycline 100 mg twice daily x 7 days. Take with full glass of water. Mucinex 2 tablets once  Daily. Restart your Flonase  Take with full glass of water. CXR on the way out today. We will call you with results. Please have INR checked by Dr. Merita Norton Office once antibiotic treatment is completed to ensure you are within range. Follow up with Dr. [name] in 1 month. Please contact office for sooner follow up if symptoms do not improve or worsen or seek emergency care  Pt. Counseled by phone to be hypervigilant of any increased bleeding or bruising as he is on both antibiotic and Coumadin.

## 2015-12-31 NOTE — Telephone Encounter (Signed)
Spoke with pt. States that he has been coughing for the past 2 weeks. Cough is producing black mucus. Chest tightness is also present. Denies wheezing or SOB. Advised pt that we would need to see him here in the office. He has agreed to see SG today. OV has been scheduled for today at 12pm. Nothing further was needed.

## 2015-12-31 NOTE — Telephone Encounter (Signed)
Pt seen in office today 12/31/15 bu SG and was advised to give Doxy, this interacted with pt's coumadin. Spoke with SG and was advised to still call in Doxy but have pt look out for signs of bleeding and to have INR checked once abx has been completed. Doxy was sent to [facility].   LMTCB for pt on mobile phone. Called and spoke to pt's wife on home number, she is aware of the interaction but will have pt call back for [place] to inform him.   Will route this to my box and triage to follow.

## 2015-12-31 NOTE — Progress Notes (Signed)
History of Present Illness COREE [name] is a 80 y.o. male with remote smoking history followed for chronic asthmatic bronchitis and bronchiectasis , sinusitis, allergic rhinitis , and OSA with CPAP use, He is followed by Dr. Annamaria [name].   12/31/2015 Acute OV: Pt. Presents to the office today with the complaint of Increased saliva x 1 month, with worsening shortness of breath with rare  wheezing. He has had a cough  for a month, with post nasal drip.The cough is not keeping him up at night. It is productive for occasional dark  brown secretions with occasional flecks of blood. He describes it as a slowly building sensation that evolves into  Coughing  up a " glob" of dark brown secretions. He states he has only seen a rare fleck or two of blood. He denies fever,chest pain, orthopnea. He states he has had a sore throat x 10 days.He gets allergy shots every week for his allergies here in the office.  He is using his Flonase as needed. He was prescribed Advair inhaler for his shortness of breath,  but stated it made him feel bad so he is not using it. He does have a rescue inhaler that he states he rarely uses. Of note he is on Coumadin.  Tests CXR 12/7/2917L >> Pending  Past medical hx Past Medical History:  Diagnosis Date  . Alcohol abuse, episodic 05/19/2008  . ALLERGIC RHINITIS 04/15/2009  . Allergy    YEAR ROUND,TAKE SHOTS  . Alzheimer's dementia    beginning with sx  . Anxiety   . Arthritis   . ASTHMA 11/16/2006   sinusitis-    hx ?yeast patch/white patch on vocal cord as per [facility] 02/25/11-   . Atrial fibrillation ([place]) 11/16/2006   remote CHF related to atrial fib with rapid ventricular response over 25 yrs per office note,  . Cancer [facility]) 2002   prostate cancer  . Chronic systolic CHF (congestive heart failure) (HCC)    echo (10/02/12): EF 123456, grade 1 diastolic dysfunction, trivial AI, mild MR, mild RVE  . COLONIC POLYPS, ADENOMATOUS, HX OF 02/14/2008  . CORONARY ARTERY DISEASE  11/16/2006   LHC (07/2011): LAD with luminal irregularities, proximal circumflex 50%, EF 25%  . Depression   . DIVERTICULOSIS, COLON 02/14/2008  . ESOPHAGEAL STRICTURE 01/28/2008  . GERD 02/14/2008  . Headache(784.0)   . HYPERGLYCEMIA 11/16/2006  . HYPERLIPIDEMIA 02/14/2008  . HYPERTENSION 11/16/2006  . ICD (implantable cardiac defibrillator) in place   . INSOMNIA-SLEEP DISORDER-UNSPEC 02/11/2009  . Lesion of vocal cord    bx begine  . Myocardial infarction 1993  . NICM (nonischemic cardiomyopathy) ([place])   . OBSTRUCTIVE SLEEP APNEA 11/10/2008  . 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 Pacemaker    St. Jude  . Pneumonia    hx of  . PROSTATE CANCER, HX OF 02/25/2000  . SLEEP APNEA 10/03/2009   LOV Dr Corey [name] 12/12 in EPIC    Moderate per patient- settings ?6/last sleep study years ago  . Sleep apnea   . Stroke ([place])    Tia  . Symptomatic bradycardia, secondary to sinus node dysfunction 07/08/2011   s/p [facility] Scientific PPM by Dr Sallyanne Kuster, upgraded to [place] ICD 12/13 by Dr Rayann Heman ([facility])  . TRANSIENT ISCHEMIC ATTACKS, HX OF 11/16/2006     Past surgical hx, Family hx, Social hx all reviewed.  Current Outpatient Prescriptions on File Prior to Visit  Medication Sig  . albuterol (PROVENTIL HFA;VENTOLIN HFA) 108 (90 BASE) MCG/ACT inhaler Inhale 1-2 puffs into  the lungs every 6 (six) hours as needed for shortness of breath.  Marland Kitchen amiodarone (PACERONE) 200 MG tablet Take 0.5 tablets (100 mg total) by mouth daily.  . Cyanocobalamin (B-12) 100 MCG TABS Take by mouth.  . folic acid (FOLVITE) 1 MG tablet TAKE ONE TABLET EACH DAY  . metoprolol succinate (TOPROL-XL) 50 MG 24 hr tablet Take 1/2 tablet by mouth every evening  . mometasone (NASONEX) 50 MCG/ACT nasal spray TWO SPRAYS IN EACH NOSTRIL DAILY AS NEEDED FOR ALLERGIES  . NONFORMULARY OR COMPOUNDED ITEM Allergy Vaccine 1:10 Given at [facility]  . pantoprazole (PROTONIX) 40 MG tablet Take 1 tablet (40 mg total) by mouth daily.  .  polyethylene glycol powder (GLYCOLAX/MIRALAX) powder Take 17 g by mouth daily as needed (constipation).   . ramipril (ALTACE) 10 MG capsule Take 10 mg by mouth daily.   . rosuvastatin (CRESTOR) 5 MG tablet TAKE ONE TABLET IN THE EVENING  . sertraline (ZOLOFT) 50 MG tablet Take 1 tablet (50 mg total) by mouth daily.  Marland Kitchen spironolactone (ALDACTONE) 25 MG tablet TAKE ONE TABLET BY MOUTH ONCE DAILY  . warfarin (COUMADIN) 2.5 MG tablet TAKE AS DIRECTED BY COUMADIN CLINIC  . albuterol (ACCUNEB) 0.63 MG/3ML nebulizer solution Inhale into the lungs.  . clonazePAM (KLONOPIN) 0.5 MG tablet TAKE 1/2 TO 2 TABLETS AS NEEDED FOR SLEEP (Patient not taking: Reported on 12/31/2015)  . levalbuterol (XOPENEX) 1.25 MG/3ML nebulizer solution Inhale into the lungs.  . umeclidinium-vilanterol (ANORO ELLIPTA) 62.5-25 MCG/INH AEPB Inhale 1 puff into the lungs daily. (Patient not taking: Reported on 12/31/2015)   No current facility-administered medications on file prior to visit.      Allergies  Allergen Reactions  . Altace [Ramipril] Cough  . Hydrocodone Other (See Comments)    Severe panic attacks  . Oxycodone Other (See Comments)    Severe panic attacks    Review Of Systems:  Constitutional:   No  weight loss, night sweats,  Fevers, chills, fatigue, or  lassitude.  HEENT:   No headaches,  Difficulty swallowing,  Tooth/dental problems, or  +Sore throat,                No sneezing, itching, ear ache,+ nasal congestion, +post nasal drip,   CV:  No chest pain,  Orthopnea, PND, swelling in lower extremities, anasarca, dizziness, palpitations, syncope.   GI  No heartburn, indigestion, abdominal pain, nausea, vomiting, diarrhea, change in bowel habits, loss of appetite, bloody stools.   Resp: + shortness of breath with exertion not  at rest.  + excess mucus, + productive cough,  No non-productive cough,  No coughing up of blood.  +change in color of mucus.  rare wheezing.  No chest wall deformity  Skin: no rash  or lesions.  GU: no dysuria, change in color of urine, no urgency or frequency.  No flank pain, no hematuria   MS:  No joint pain or swelling.  No decreased range of motion.  No back pain.  Psych:  No change in mood or affect. No depression or anxiety.  No memory loss.   Vital Signs BP 130/72 (BP Location: Left Arm, Cuff Size: Normal)   Pulse 68   Ht 6' (1.829 m)   Wt 195 lb (88.5 kg)   SpO2 98%   BMI 26.45 kg/m    Physical Exam:  General- No distress,  A&Ox3, pleasant ENT: No sinus tenderness, TM clear, pale nasal mucosa, no oral exudate,+ post nasal drip, no LAN Cardiac: S1, S2, regular  rate and rhythm, no murmur Chest: No wheeze/ rales/ dullness; no accessory muscle use, no nasal flaring, no sternal retractions Abd.: Soft Non-tender Ext: No clubbing cyanosis, edema Neuro:  normal strength Skin: No rashes, warm and dry Psych: normal mood and behavior   Assessment/Plan  Bronchiectasis without acute exacerbation (HCC) Mild Flare: Plan: Please start taking Claritin 10 mg once daily. We will treat you with Doxycycline 100 mg twice daily x 7 days. Take with full glass of water. Mucinex 2 tablets once  Daily. Take with a full glass of water CXR on the way out today. We will call you with results. Please have INR checked by Dr. Merita Norton Office once antibiotic treatment is completed to ensure you are within range. Follow up with Dr. Annamaria [name] in 1 month. Please contact office for sooner follow up if symptoms do not improve or worsen or seek emergency care  Pt. Counseled by phone to be hypervigilant of any increased bleeding or bruising as he is on both antibiotic and Coumadin, and to seek emergency medical care for acute bleeding   Seasonal and perennial allergic rhinitis Please start taking Claritin 10 mg once daily. Restart your Flonase  Nasal Saline mist as needed. Follow up with Dr. Annamaria [name] in 1 month. Please contact office for sooner follow up if symptoms do not  improve or worsen or seek emergency care      Magdalen Spatz, NP 12/31/2015  1:18 PM

## 2015-12-31 NOTE — Telephone Encounter (Signed)
Called and spoke to pt. Informed him of the recs per SG. Pt verbalized understanding and denied any further questions or concerns at this time.

## 2015-12-31 NOTE — Assessment & Plan Note (Addendum)
Please start taking Claritin 10 mg once daily. Restart your Flonase  Nasal Saline mist as needed. Follow up with Dr. [name] in 1 month. Please contact office for sooner follow up if symptoms do not improve or worsen or seek emergency care

## 2016-01-01 ENCOUNTER — Telehealth: Payer: Self-pay | Admitting: Acute Care

## 2016-01-01 ENCOUNTER — Ambulatory Visit (INDEPENDENT_AMBULATORY_CARE_PROVIDER_SITE_OTHER): Payer: Medicare Other | Admitting: *Deleted

## 2016-01-01 DIAGNOSIS — J309 Allergic rhinitis, unspecified: Secondary | ICD-10-CM

## 2016-01-01 MED ORDER — MOMETASONE FUROATE 50 MCG/ACT NA SUSP: NASAL | 0 refills | Status: AC

## 2016-01-01 NOTE — Telephone Encounter (Signed)
CY  Please Advise-  This pt saw SG yesterday and he states he woke up and feels like his symptoms have been worse. Having increase SOB, coughing with dark brown mucus, lots of congestion,chest tightness. He just started his doxy this am. He stated she wanted him to try Flonase but does not feel like it works. He wants to know if it is ok to get a rx for nasonex. Below are SG recommendations to pt.  It is nice to meet you today. Please start taking Claritin 10 mg once daily. We will treat you with Doxycycline 100 mg twice daily x 7 days. Take with full glass of water. Mucinex 2 tablets once  Daily. Restart your Flonase  Take with full glass of water. CXR on the way out today. We will call you with results. Please have INR checked by Dr. Mer[facility] once antibiotic treatment is completed to ensure you are within range. Follow up with Dr. [name] in 1 month. Please contact office for sooner follow up if symptoms do not improve or worsen or seek emergency care  Pt. Counseled by phone to be hypervigilant of any increased bleeding or bruising as he is on both antibiotic and Coumadin.

## 2016-01-01 NOTE — Telephone Encounter (Signed)
Ok script Nasonex  # 1,   1-2 puffs each nostril once or twice daily if needed  He could also try otc nasal saline spray, Mucinex DM, comfort meds for cold and flu symptoms as needed. There are a lot of bad viral colds and some early flu going around now.

## 2016-01-01 NOTE — Telephone Encounter (Signed)
Rx sent to preferred pharmacy. Pt aware of CY recommendations and voiced understanding. Nothing further needed.

## 2016-01-04 ENCOUNTER — Ambulatory Visit (INDEPENDENT_AMBULATORY_CARE_PROVIDER_SITE_OTHER): Payer: Medicare Other | Admitting: Internal Medicine

## 2016-01-04 ENCOUNTER — Encounter: Payer: Self-pay | Admitting: Internal Medicine

## 2016-01-04 VITALS — BP 120/82 | HR 69 | Temp 97.7°F | Resp 16 | Ht 72.0 in | Wt 195.5 lb

## 2016-01-04 DIAGNOSIS — J988 Other specified respiratory disorders: Secondary | ICD-10-CM | POA: Diagnosis not present

## 2016-01-04 DIAGNOSIS — J449 Chronic obstructive pulmonary disease, unspecified: Secondary | ICD-10-CM | POA: Diagnosis not present

## 2016-01-04 DIAGNOSIS — I255 Ischemic cardiomyopathy: Secondary | ICD-10-CM | POA: Diagnosis not present

## 2016-01-04 MED ORDER — PROMETHAZINE-DM 6.25-15 MG/5ML PO SYRP: 5.0000 mL | ORAL_SOLUTION | Freq: Four times a day (QID) | ORAL | 0 refills | Status: DC | PRN

## 2016-01-04 MED ORDER — AMOXICILLIN-POT CLAVULANATE 875-125 MG PO TABS: 1.0000 | ORAL_TABLET | Freq: Two times a day (BID) | ORAL | 0 refills | Status: AC

## 2016-01-04 NOTE — Progress Notes (Signed)
Subjective:  Patient ID: Corey [name], male    DOB: [birth year]  Age: 80 y.o. MRN: [id number]  CC: Cough   HPI Corey [name] presents for a 2-3 week history of cough that is productive of thick brown/yellow phlegm. He was seen elsewhere about 5 days ago and was given a prescription for doxycycline but has not improved. He has had a few episodes of wheezing. He has a history of COPD and is not been using a Norco. He is getting symptom relief with albuterol inhaler. He denies shortness of breath, chest pain, hemoptysis, night sweats, fever, chills.  Outpatient Medications Prior to Visit  Medication Sig Dispense Refill  . albuterol (ACCUNEB) 0.63 MG/3ML nebulizer solution Inhale into the lungs.    Marland Kitchen albuterol (PROVENTIL HFA;VENTOLIN HFA) 108 (90 BASE) MCG/ACT inhaler Inhale 1-2 puffs into the lungs every 6 (six) hours as needed for shortness of breath. 1 Inhaler 11  . amiodarone (PACERONE) 200 MG tablet Take 0.5 tablets (100 mg total) by mouth daily. 45 tablet 3  . clonazePAM (KLONOPIN) 0.5 MG tablet TAKE 1/2 TO 2 TABLETS AS NEEDED FOR SLEEP 30 tablet 2  . Cyanocobalamin (B-12) 100 MCG TABS Take by mouth.    . folic acid (FOLVITE) 1 MG tablet TAKE ONE TABLET EACH DAY 90 tablet 3  . levalbuterol (XOPENEX) 1.25 MG/3ML nebulizer solution Inhale into the lungs.    . metoprolol succinate (TOPROL-XL) 50 MG 24 hr tablet Take 1/2 tablet by mouth every evening 15 tablet 11  . mometasone (NASONEX) 50 MCG/ACT nasal spray 1-2 puffs each nostril once or twice daily if needed 17 g 0  . NONFORMULARY OR COMPOUNDED ITEM Allergy Vaccine 1:10 Given at [facility]    . pantoprazole (PROTONIX) 40 MG tablet Take 1 tablet (40 mg total) by mouth daily. 30 tablet 3  . polyethylene glycol powder (GLYCOLAX/MIRALAX) powder Take 17 g by mouth daily as needed (constipation).     . rosuvastatin (CRESTOR) 5 MG tablet TAKE ONE TABLET IN THE EVENING 30 tablet 11  . sertraline (ZOLOFT) 50 MG tablet Take 1 tablet  (50 mg total) by mouth daily. 30 tablet 3  . spironolactone (ALDACTONE) 25 MG tablet TAKE ONE TABLET BY MOUTH ONCE DAILY 30 tablet 11  . umeclidinium-vilanterol (ANORO ELLIPTA) 62.5-25 MCG/INH AEPB Inhale 1 puff into the lungs daily. 90 each 3  . warfarin (COUMADIN) 2.5 MG tablet TAKE AS DIRECTED BY COUMADIN CLINIC 35 tablet 2  . doxycycline (VIBRA-TABS) 100 MG tablet Take 1 tablet (100 mg total) by mouth 2 (two) times daily. 14 tablet 0  . ramipril (ALTACE) 10 MG capsule Take 10 mg by mouth daily.      No facility-administered medications prior to visit.     ROS Review of Systems  Constitutional: Negative for appetite change, chills, diaphoresis, fatigue and fever.  HENT: Negative.  Negative for facial swelling, sinus pressure, sore throat and trouble swallowing.   Eyes: Negative.  Negative for visual disturbance.  Respiratory: Positive for cough and wheezing. Negative for choking, shortness of breath and stridor.   Cardiovascular: Negative.  Negative for chest pain, palpitations and leg swelling.  Gastrointestinal: Negative for abdominal pain, constipation, diarrhea, nausea and vomiting.  Endocrine: Negative.   Genitourinary: Negative.   Musculoskeletal: Negative.  Negative for back pain, myalgias and neck pain.  Skin: Negative.  Negative for color change and rash.  Allergic/Immunologic: Negative.   Neurological: Negative.  Negative for dizziness.  Hematological: Negative.  Negative for adenopathy. Does not bruise/bleed easily.  Psychiatric/Behavioral: Negative.     Objective:  BP 120/82 (BP Location: Left Arm, Patient Position: Sitting, Cuff Size: Large)   Pulse 69   Temp 97.7 F (36.5 C) (Oral)   Resp 16   Ht 6' (1.829 m)   Wt 195 lb 8 oz (88.7 kg)   SpO2 94%   BMI 26.51 kg/m   BP Readings from Last 3 Encounters:  01/04/16 120/82  12/31/15 130/72  12/07/15 122/70    Wt Readings from Last 3 Encounters:  01/04/16 195 lb 8 oz (88.7 kg)  12/31/15 195 lb (88.5 kg)    12/07/15 192 lb 8 oz (87.3 kg)    Physical Exam  Constitutional: He is oriented to person, place, and time. No distress.  HENT:  Mouth/Throat: Oropharynx is clear and moist. No oropharyngeal exudate.  Eyes: Conjunctivae are normal. Right eye exhibits no discharge. Left eye exhibits no discharge. No scleral icterus.  Neck: Normal range of motion. Neck supple. No JVD present. No tracheal deviation present. No thyromegaly present.  Cardiovascular: Normal rate, regular rhythm, normal heart sounds and intact distal pulses.  Exam reveals no gallop and no friction rub.   No murmur heard. Pulmonary/Chest: Effort normal and breath sounds normal. No stridor. No respiratory distress. He has no wheezes. He has no rales. He exhibits no tenderness.  Abdominal: Soft. Bowel sounds are normal. He exhibits no distension and no mass. There is no tenderness. There is no rebound and no guarding.  Musculoskeletal: Normal range of motion. He exhibits no edema, tenderness or deformity.  Lymphadenopathy:    He has no cervical adenopathy.  Neurological: He is oriented to person, place, and time.  Skin: Skin is warm and dry. No rash noted. He is not diaphoretic. No erythema. No pallor.  Vitals reviewed.   Lab Results  Component Value Date   WBC 5.0 12/01/2015   HGB 13.4 12/01/2015   HCT 40.7 12/01/2015   PLT 201 12/01/2015   GLUCOSE 108 (H) 12/01/2015   CHOL 183 12/01/2015   TRIG 184 (H) 12/01/2015   HDL 42 12/01/2015   LDLCALC 104 (H) 12/01/2015   ALT 36 12/07/2015   AST 40 (H) 12/07/2015   NA 138 12/01/2015   K 5.0 12/01/2015   CL 103 12/01/2015   CREATININE 1.14 12/01/2015   BUN 17 12/01/2015   CO2 28 12/01/2015   TSH 0.22 (L) 12/07/2015   PSA 0.02 (L) 11/13/2013   INR 1.7 12/22/2015   HGBA1C 5.5 11/18/2014    Dg Chest 2 View  Result Date: 12/31/2015 CLINICAL DATA:  Productive cough. EXAM: CHEST  2 VIEW COMPARISON:  Radiograph of October 05, 2014. FINDINGS: The heart size and mediastinal  contours are within normal limits. Both lungs are clear. No pneumothorax or pleural effusion is noted. Stable left-sided pacemaker is noted. The visualized skeletal structures are unremarkable. IMPRESSION: No active cardiopulmonary disease. Electronically Signed   By: Marijo Conception, M.D.   On: 12/31/2015 15:09    Assessment & Plan:   Corey [name] was seen today for cough.  Diagnoses and all orders for this visit:  RTI (respiratory tract infection)- will upgrade his antibiotic therapy to Augmentin to cover resistant gram positives and anaerobes, will also offer promethazine-dm as needed for the cough. -     amoxicillin-clavulanate (AUGMENTIN) 875-125 MG tablet; Take 1 tablet by mouth 2 (two) times daily. -     promethazine-dextromethorphan (PROMETHAZINE-DM) 6.25-15 MG/5ML syrup; Take 5 mLs by mouth 4 (four) times daily as needed for cough.  Obstructive  chronic bronchitis without exacerbation ([place])- I've asked him to be more compliant with the use of the LABA/LAMA combination inhaler.   I have discontinued Mr. Baltazar ramipril and doxycycline. I am also having him start on amoxicillin-clavulanate and promethazine-dextromethorphan. Additionally, I am having him maintain his polyethylene glycol powder, albuterol, NONFORMULARY OR COMPOUNDED ITEM, B-12, metoprolol succinate, clonazePAM, spironolactone, amiodarone, levalbuterol, albuterol, pantoprazole, sertraline, umeclidinium-vilanterol, folic acid, rosuvastatin, warfarin, and mometasone.  Meds ordered this encounter  Medications  . amoxicillin-clavulanate (AUGMENTIN) 875-125 MG tablet    Sig: Take 1 tablet by mouth 2 (two) times daily.    Dispense:  20 tablet    Refill:  0  . promethazine-dextromethorphan (PROMETHAZINE-DM) 6.25-15 MG/5ML syrup    Sig: Take 5 mLs by mouth 4 (four) times daily as needed for cough.    Dispense:  118 mL    Refill:  0     Follow-up: Return in about 3 weeks (around 01/25/2016).  Scarlette Calico, MD

## 2016-01-04 NOTE — Patient Instructions (Signed)

## 2016-01-04 NOTE — Progress Notes (Signed)
Pre visit review using our clinic review tool, if applicable. No additional management support is needed unless otherwise documented below in the visit note. 

## 2016-01-06 ENCOUNTER — Encounter: Payer: Self-pay | Admitting: Cardiology

## 2016-01-07 ENCOUNTER — Ambulatory Visit (INDEPENDENT_AMBULATORY_CARE_PROVIDER_SITE_OTHER): Payer: Medicare Other | Admitting: *Deleted

## 2016-01-07 DIAGNOSIS — J309 Allergic rhinitis, unspecified: Secondary | ICD-10-CM

## 2016-01-12 NOTE — Telephone Encounter (Signed)
Echo results given to pt on 12-31-15.

## 2016-01-14 ENCOUNTER — Ambulatory Visit (INDEPENDENT_AMBULATORY_CARE_PROVIDER_SITE_OTHER): Payer: Medicare Other | Admitting: *Deleted

## 2016-01-14 DIAGNOSIS — J309 Allergic rhinitis, unspecified: Secondary | ICD-10-CM | POA: Diagnosis not present

## 2016-01-19 ENCOUNTER — Telehealth: Payer: Self-pay

## 2016-01-19 NOTE — Telephone Encounter (Signed)
Received fax refill request from [facility] for Pantoprazole 40mg  qd. This pt has not been seen in the office since 10/2014. Are you ok with giving refills? Pharmacy Fax # [phone]

## 2016-01-19 NOTE — Telephone Encounter (Signed)
Per Dr Havery Moros refilled Pantoprazole x1. Asked for pt to schedule an appt before more refills could be given.

## 2016-01-19 NOTE — Telephone Encounter (Signed)
We can refill this but I would like to see him back in clinic for routine follow-up. Thinks

## 2016-01-21 ENCOUNTER — Ambulatory Visit (INDEPENDENT_AMBULATORY_CARE_PROVIDER_SITE_OTHER): Payer: Medicare Other | Admitting: *Deleted

## 2016-01-21 DIAGNOSIS — J309 Allergic rhinitis, unspecified: Secondary | ICD-10-CM | POA: Diagnosis not present

## 2016-01-23 ENCOUNTER — Other Ambulatory Visit: Payer: Self-pay | Admitting: Neurology

## 2016-01-29 ENCOUNTER — Ambulatory Visit (INDEPENDENT_AMBULATORY_CARE_PROVIDER_SITE_OTHER): Payer: Medicare Other

## 2016-01-29 DIAGNOSIS — I5022 Chronic systolic (congestive) heart failure: Secondary | ICD-10-CM

## 2016-01-29 DIAGNOSIS — Z95810 Presence of automatic (implantable) cardiac defibrillator: Secondary | ICD-10-CM

## 2016-01-29 NOTE — Progress Notes (Signed)
EPIC Encounter for ICM Monitoring  Patient Name: Corey [name] is a 81 y.o. male Date: 01/29/2016 Primary Care Physican: Scarlette Calico, MD Primary Cardiologist:Lori Servando Snare, NP Electrophysiologist: Allred Dry Weight:191 lbs Bi-V Pacing: 98%      Heart Failure questions reviewed, pt asymptomatic   Thoracic impedance normal   Labs: 09/30/2015 Creatinine 0.98, BUN 25, Potassium 4.9, Sodium 139  11/23/2016Creatinine 1.22, BUN 19, Potassium 4.8, Sodium 138   Recommendations: No changes.  Reinforced to limit low salt food choices to 2000 mg day and limiting fluid intake to < 2 liters per day. Encouraged to call for fluid symptoms.    Follow-up plan: ICM clinic phone appointment on 02/29/2016 day before office visit with Truitt Merle, NP 03/01/2016.  Copy of ICM check sent to device physician.   3 month ICM trend : 01/29/2016   1 Year ICM trend:      Rosalene Billings, RN 01/29/2016 5:53 PM

## 2016-02-01 DIAGNOSIS — F4323 Adjustment disorder with mixed anxiety and depressed mood: Secondary | ICD-10-CM | POA: Diagnosis not present

## 2016-02-02 ENCOUNTER — Telehealth: Payer: Self-pay | Admitting: Internal Medicine

## 2016-02-05 ENCOUNTER — Ambulatory Visit: Payer: Medicare Other | Admitting: Gastroenterology

## 2016-02-15 ENCOUNTER — Ambulatory Visit: Payer: Medicare Other | Admitting: Internal Medicine

## 2016-02-17 ENCOUNTER — Ambulatory Visit: Payer: Medicare Other | Admitting: Gastroenterology

## 2016-02-18 ENCOUNTER — Other Ambulatory Visit: Payer: Self-pay

## 2016-02-18 ENCOUNTER — Telehealth: Payer: Self-pay

## 2016-02-18 MED ORDER — PANTOPRAZOLE SODIUM 40 MG PO TBEC: 40.0000 mg | DELAYED_RELEASE_TABLET | Freq: Every day | ORAL | 3 refills | Status: DC

## 2016-02-18 NOTE — Telephone Encounter (Signed)
Yes you can refill it. Can you change it to #90 tablets, RF#3. Thanks

## 2016-02-18 NOTE — Telephone Encounter (Signed)
Received fax from [facility] for Pantoprazole 40mg  #30. Take daily Dr Havery Moros, This pt has not been seen since 10/2014. Are you ok to refill? There is no upcoming appt scheduled. Pharm fax [phone]

## 2016-02-22 ENCOUNTER — Other Ambulatory Visit: Payer: Self-pay | Admitting: Neurology

## 2016-02-29 ENCOUNTER — Ambulatory Visit (INDEPENDENT_AMBULATORY_CARE_PROVIDER_SITE_OTHER): Payer: Medicare Other

## 2016-02-29 DIAGNOSIS — Z95810 Presence of automatic (implantable) cardiac defibrillator: Secondary | ICD-10-CM | POA: Diagnosis not present

## 2016-02-29 DIAGNOSIS — I5022 Chronic systolic (congestive) heart failure: Secondary | ICD-10-CM

## 2016-02-29 NOTE — Progress Notes (Addendum)
CARDIOLOGY OFFICE NOTE  Date:  03/01/2016    Corey [name] Date of Birth: 1935/01/27 Medical Record [id number]  PCP:  Scarlette Calico, MD  Cardiologist:  [place]  Chief Complaint  Patient presents with  . Cardiomyopathy    Follow up visit - seen for Dr. Rayann Heman    History of Present Illness: Corey [name] is a 81 y.o. male who presents today for a follow up visit. Seen for Dr. Rayann Heman - former patient of Dr. Lowella Fairy.   He has known CAD with non obstructive CAD by cath, NICM, chronic systolic HF, alcohol abuse, Alzheimer's, prostate cancer, HTN, HLD, atrial fib, chronic anticoagulation, underlying PPM/CRT-D, and OSA. Other issues as noted below.   I saw him back last November - multiple complaints. More fatigued Gaining weight. Had not had his coumadin checked since August. Got his echo updated - surprisingly his EF had improved.   Comes in today. Here alone. Has not had his coumadin checked since November. Just took his AM medicines right before coming here today. Says he is feeling ok. Not doing much in regards to activity. No real complaints. Still drinking alcohol - admits sometimes to excess. Wife tells him he drinks too much. Planning on going to [place] on Sunday for 3 week cruise down the [employer].   Past Medical History:  Diagnosis Date  . Alcohol abuse, episodic 05/19/2008  . ALLERGIC RHINITIS 04/15/2009  . Allergy    YEAR ROUND,TAKE SHOTS  . Alzheimer's dementia    beginning with sx  . Anxiety   . Arthritis   . ASTHMA 11/16/2006   sinusitis-    hx ?yeast patch/white patch on vocal cord as per [facility] 02/25/11-   . Atrial fibrillation ([place]) 11/16/2006   remote CHF related to atrial fib with rapid ventricular response over 25 yrs per office note,  . Cancer [facility]) 2002   prostate cancer  . Chronic systolic CHF (congestive heart failure) (HCC)    echo (10/02/12): EF 123456, grade 1 diastolic dysfunction, trivial AI, mild MR, mild RVE  . COLONIC  POLYPS, ADENOMATOUS, HX OF 02/14/2008  . CORONARY ARTERY DISEASE 11/16/2006   LHC (07/2011): LAD with luminal irregularities, proximal circumflex 50%, EF 25%  . Depression   . DIVERTICULOSIS, COLON 02/14/2008  . ESOPHAGEAL STRICTURE 01/28/2008  . GERD 02/14/2008  . Headache(784.0)   . HYPERGLYCEMIA 11/16/2006  . HYPERLIPIDEMIA 02/14/2008  . HYPERTENSION 11/16/2006  . ICD (implantable cardiac defibrillator) in place   . INSOMNIA-SLEEP DISORDER-UNSPEC 02/11/2009  . Lesion of vocal cord    bx begine  . Myocardial infarction 1993  . NICM (nonischemic cardiomyopathy) ([place])   . OBSTRUCTIVE SLEEP APNEA 11/10/2008  . 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 Pacemaker    St. Jude  . Pneumonia    hx of  . PROSTATE CANCER, HX OF 02/25/2000  . SLEEP APNEA 10/03/2009   LOV Dr [name] 12/12 in EPIC    Moderate per patient- settings ?6/last sleep study years ago  . Sleep apnea   . Stroke ([place])    Tia  . Symptomatic bradycardia, secondary to sinus node dysfunction 07/08/2011   s/p [facility] Scientific PPM by Dr Sallyanne Kuster, upgraded to [employer] ICD 12/13 by Dr Rayann Heman (SJM)  . TRANSIENT ISCHEMIC ATTACKS, HX OF 11/16/2006    Past Surgical History:  Procedure Laterality Date  . BACK SURGERY    . BI-VENTRICULAR IMPLANTABLE CARDIOVERTER DEFIBRILLATOR UPGRADE N/A 01/16/2012   Procedure: BI-VENTRICULAR IMPLANTABLE CARDIOVERTER DEFIBRILLATOR UPGRADE;  Surgeon: Thompson Grayer,  MD;  Location: [place] CATH LAB;  Service: Cardiovascular;  Laterality: N/A;  . CARDIAC CATHETERIZATION     several  . CARDIAC DEFIBRILLATOR PLACEMENT  01/16/12   Upgrade to a biventricular SJM ICD by DR Allred  . COLONOSCOPY    . ESOPHAGOGASTRODUODENOSCOPY     with dilitation  . INSERT / REPLACE / REMOVE PACEMAKER    . KNEE ARTHROSCOPY     2 surgeries right and 1 left  . LEFT AND RIGHT HEART CATHETERIZATION WITH CORONARY ANGIOGRAM N/A 08/18/2011   Procedure: LEFT AND RIGHT HEART CATHETERIZATION WITH CORONARY ANGIOGRAM;  Surgeon: [name], MD;   Location: [place] CATH LAB;  Service: Cardiovascular;  Laterality: N/A;  . LUMBAR LAMINECTOMY/DECOMPRESSION MICRODISCECTOMY  03/02/2011   Procedure: LUMBAR LAMINECTOMY/DECOMPRESSION MICRODISCECTOMY;  Surgeon: [name], MD;  Location: [facility] ORS;  Service: Orthopedics;  Laterality: N/A;  Decompression Lumbar 4 - Lumbar(X-Ray)  . PACEMAKER INSERTION  06/2011   [employer] PPM implanted by Dr Sallyanne Kuster  . PERMANENT PACEMAKER INSERTION Left 07/08/2011   Procedure: PERMANENT PACEMAKER INSERTION;  Surgeon: [name], MD;  Location: [place] CATH LAB;  Service: Cardiovascular;  Laterality: Left;  . PROSTATE SURGERY  2/02   prostate cancer  . TONSILECTOMY, ADENOIDECTOMY, BILATERAL MYRINGOTOMY AND TUBES    . TONSILLECTOMY  as child  . TOTAL KNEE ARTHROPLASTY Left 06/04/2012   Procedure: TOTAL KNEE ARTHROPLASTY;  Surgeon: [name], MD;  Location: [facility] OR[facility];  Service: Orthopedics;  Laterality: Left;  . UVULOPALATOPHARYNGOPLASTY  2013  . vocal cord lesion bx       Medications: Current Outpatient Prescriptions  Medication Sig Dispense Refill  . albuterol (ACCUNEB) 0.63 MG/3ML nebulizer solution Inhale into the lungs.    Marland Kitchen albuterol (PROVENTIL HFA;VENTOLIN HFA) 108 (90 BASE) MCG/ACT inhaler Inhale 1-2 puffs into the lungs every 6 (six) hours as needed for shortness of breath. 1 Inhaler 11  . amiodarone (PACERONE) 200 MG tablet Take 0.5 tablets (100 mg total) by mouth daily. 45 tablet 3  . clonazePAM (KLONOPIN) 0.5 MG tablet TAKE 1/2 TO 2 TABLETS AS NEEDED FOR SLEEP 30 tablet 2  . Cyanocobalamin (B-12) 100 MCG TABS Take by mouth.    . folic acid (FOLVITE) 1 MG tablet TAKE ONE TABLET EACH DAY 90 tablet 3  . levalbuterol (XOPENEX) 1.25 MG/3ML nebulizer solution Inhale into the lungs.    . metoprolol succinate (TOPROL-XL) 50 MG 24 hr tablet Take 1/2 tablet by mouth every evening 15 tablet 11  . mometasone (NASONEX) 50 MCG/ACT nasal spray 1-2 puffs each nostril once or twice daily if needed 17 g 0  .  NONFORMULARY OR COMPOUNDED ITEM Allergy Vaccine 1:10 Given at [facility]    . pantoprazole (PROTONIX) 40 MG tablet Take 1 tablet (40 mg total) by mouth daily. 90 tablet 3  . polyethylene glycol powder (GLYCOLAX/MIRALAX) powder Take 17 g by mouth daily as needed (constipation).     . promethazine-dextromethorphan (PROMETHAZINE-DM) 6.25-15 MG/5ML syrup Take 5 mLs by mouth 4 (four) times daily as needed for cough. 118 mL 0  . rosuvastatin (CRESTOR) 5 MG tablet TAKE ONE TABLET IN THE EVENING 30 tablet 11  . sertraline (ZOLOFT) 50 MG tablet TAKE ONE TABLET EACH DAY 30 tablet 2  . spironolactone (ALDACTONE) 25 MG tablet TAKE ONE TABLET BY MOUTH ONCE DAILY 30 tablet 11  . umeclidinium-vilanterol (ANORO ELLIPTA) 62.5-25 MCG/INH AEPB Inhale 1 puff into the lungs daily. 90 each 3  . warfarin (COUMADIN) 2.5 MG tablet TAKE AS DIRECTED BY COUMADIN CLINIC 35 tablet  2   No current facility-administered medications for this visit.     Allergies: Allergies  Allergen Reactions  . Altace [Ramipril] Cough  . Hydrocodone Other (See Comments)    Severe panic attacks  . Oxycodone Other (See Comments)    Severe panic attacks    Social History: The patient  reports that he quit smoking about 51 years ago. His smoking use included Cigarettes. He has a 4.00 pack-year smoking history. He has never used smokeless tobacco. He reports that he drinks about 12.0 oz of alcohol per week . He reports that he does not use drugs.   Family History: The patient's family history includes Heart attack in his brother, father, and maternal uncle; Heart disease in his maternal uncle.   Review of Systems: Please see the history of present illness.   Otherwise, the review of systems is positive for none.   All other systems are reviewed and negative.   Physical Exam: VS:  BP (!) 170/90   Pulse 65   Ht 6' (1.829 m)   Wt 203 lb (92.1 kg)   SpO2 98% Comment: at rest  BMI 27.53 kg/m  .  BMI Body mass index is 27.53  kg/m.  Wt Readings from Last 3 Encounters:  03/01/16 203 lb (92.1 kg)  01/04/16 195 lb 8 oz (88.7 kg)  12/31/15 195 lb (88.5 kg)   BP is 150/80 by me today.   General: Elderly male. Alert and in no acute distress.  Weight continue to climb.  HEENT: Normal.  Neck: Supple, no JVD, carotid bruits, or masses noted.  Cardiac: Regular rate and rhythm. Heart tones are distant. No murmurs, rubs, or gallops. No edema.  Respiratory:  Lungs are clear to auscultation bilaterally with normal work of breathing.  GI: Soft and nontender.  MS: No deformity or atrophy. Gait and ROM intact.  Skin: Warm and dry. Color is normal.  Neuro:  Strength and sensation are intact and no gross focal deficits noted.  Psych: Alert, appropriate and with normal affect.   LABORATORY DATA:  EKG:  EKG is ordered today. This shows AV pacing.   Lab Results  Component Value Date   WBC 5.0 12/01/2015   HGB 13.4 12/01/2015   HCT 40.7 12/01/2015   PLT 201 12/01/2015   GLUCOSE 108 (H) 12/01/2015   CHOL 183 12/01/2015   TRIG 184 (H) 12/01/2015   HDL 42 12/01/2015   LDLCALC 104 (H) 12/01/2015   ALT 36 12/07/2015   AST 40 (H) 12/07/2015   NA 138 12/01/2015   K 5.0 12/01/2015   CL 103 12/01/2015   CREATININE 1.14 12/01/2015   BUN 17 12/01/2015   CO2 28 12/01/2015   TSH 0.22 (L) 12/07/2015   PSA 0.02 (L) 11/13/2013   INR 1.7 12/22/2015   HGBA1C 5.5 11/18/2014   Lab Results  Component Value Date   INR 1.7 12/22/2015   INR 1.7 12/01/2015   INR 3.8 09/03/2015    BNP (last 3 results)  Recent Labs  12/01/15 0922  BNP 5.5    ProBNP (last 3 results) No results for input(s): PROBNP in the last 8760 hours.   Other Studies Reviewed Today:   Assessment/Plan:  Angiographic Findings from 07/2011: 1. The left main coronary arteryis free of significant atherosclerosis and trifurcates into the left anterior descending artery , a large ramus intermedius artery and the dominant left circumflex coronary  artery.  2. The left anterior descending arteryis a large vessel that reaches the apex and generates  two medium size diagonal branches. There is evidence of extensive luminal irregularities and mild proximal calcification, but no hemodynamically meaningful stenoses are seen. The ramus intermedius is large and free of significant obstruction. 3. The left circumflex coronary arteryis a large-size vessel dominant vessel that generates three medium size oblique marginal arteries and the posterior descending artery. There is evidence of extensive luminal irregularities and mild proximal calcification. A 50% eccentric stenosis is seen in the proximal vessel, but no other hemodynamically meaningful stenoses are seen. 4. The right coronary arteryis a very small-size non dominant vessel, free of significant disease.  5. The left ventricleis mild to moderately dilated. The left ventricle systolic function is severely decreased with an estimated ejection fraction of 25%. Regional wall motion abnormalities are not seen. No left ventricular thrombus is seen. There is mild mitral insufficiency. The ascending aorta appears normal. There is no aortic valve stenosis by pullback.    IMPRESSIONS: Although there is evidence of moderate coronary artery disease involving a dominant left circumflex artery this cannot explain the severity of the patient's global left ventricular dysfunction. He appears to have a nonischemic dilated cardiomyopathy of uncertain etiology. The presence of a left bundle branch block raises the opportunity for biventricular pacing/CRT. However the left bundle branch block appears to be an intermittent finding. Although he has conduction system disease, he does not have frequent ventricular pacing at this point (although this can be anticipated in the future).  RECOMMENDATION: Followup as scheduled with electrophysiology to discuss device upgrade to a dual-chamber biventricular pacemaker  defibrillator. He meets criteria for ICD implantation by the SCD-HFT criteria. He is already on long-term treatment with metoprolol succinate and ramipril, albeit only moderate doses.  Coronary risk fracture modification. Consider switching to warfarin therapy due to history of paroxysmal atrial fibrillation in the setting of severe left ventricular dysfunction.      Echo Study Conclusions from 11/2015  - Left ventricle: The cavity size was normal. Wall thickness was   increased in a pattern of mild LVH. Systolic function was normal.   The estimated ejection fraction was in the range of 50% to 55%.   Regional wall motion abnormalities cannot be excluded. Doppler   parameters are consistent with abnormal left ventricular   relaxation (grade 1 diastolic dysfunction). The E/e&' ratio is   between 8-15, suggesting indeterminate LV filling pressure. - Aorta: Ascending aortic diameter: 39 mm (S). - Ascending aorta: The ascending aorta was mildly dilated. - Left atrium: The atrium was normal in size. - Right atrium: The atrium was mildly dilated. - Tricuspid valve: There was trivial regurgitation. - Pulmonary arteries: PA peak pressure: 22 mm Hg (S). - Inferior vena cava: The vessel was normal in size. The   respirophasic diameter changes were in the normal range (= 50%),   consistent with normal central venous pressure.  Impressions:  - Compared to a prior echo in 2014, the LVEF has improved from   35-40% up to 50-55%. The ascending aorta is mildly dilated at 3.9   cm.   Assessment/Plan:  1. Chronic systolic dysfunction - most recent echo showing improvement in LV function - EF now basically normal.  I suspect most of his weight gain is due to alcohol consumption/excess calories.   2. Underlying BiV/ICD - noted by Dr. Rayann Heman that he has SJM Fortify Assura advisary noted. Patient is aware. Current recommendation from SJM is to not replace the device at this time. The  patient is not device dependant. The patient has not  had appropriate device therapy in the past or implanted for secondary prevention. He is actively remotely monitored and understands the importance of compliance today.   3. ETOH - says again that he is thinking about cutting back. Unfortunately, he has said this before and not able to follow thru.   4. HTN - just took his medicines - recheck by me is a little lower. Will follow for now.   5. AFib - with a CHADS2VASC score is at least 6. We would therefore favor lifelong anticoagulation. He is maintaining sinus rhythm with amiodarone 100mg  daily. Needs labs today. Has not had his coumadin checked since November - will get INR today. Explained importance of checking monthly.   6. Nonobstructive CAD - managed medically - no chest pain noted.   7. HLD - on low dose statin.    8. Noncompliance - unfortunately, I do not see this changing as well.    Current medicines are reviewed with the patient today.  The patient does not have concerns regarding medicines other than what has been noted above.  The following changes have been made:  See above.  Labs/ tests ordered today include:    Orders Placed This Encounter  Procedures  . Basic metabolic panel  . CBC  . Hepatic function panel  . TSH  . EKG 12-Lead     Disposition:   FU with me in 4 months.    Patient is agreeable to this plan and will call if any problems develop in the interim.   SignedTruitt Merle, NP  03/01/2016 10:31 AM  [facility] [address] [place], [place]  [address] Phone: [phone] Fax: [phone]

## 2016-02-29 NOTE — Progress Notes (Signed)
EPIC Encounter for ICM Monitoring  Patient Name: Corey [name] is a 81 y.o. male Date: 02/29/2016 Primary Care Physican: Scarlette Calico, MD Primary Cardiologist:Lori Servando Snare, NP Electrophysiologist: Allred Dry Weight:196 lbs Bi-V Pacing: 98%      Heart Failure questions reviewed, pt reported weight gain 5 lbs in the last month.  He has had a head and chest cold in the last month with a cough.  Thoracic impedance is above baseline since 02/22/2016 suggesting dryness.   Thoracic impedance was abnormal suggesting fluid accumulation from 02/10/2016 to 02/22/2016.   Labs: 09/30/2015 Creatinine 0.98, BUN 25, Potassium 4.9, Sodium 139  11/23/2016Creatinine 1.22, BUN 19, Potassium 4.8, Sodium 138   Recommendations: No changes. Encouraged him to drink enough fluids to stay hydrated.  Encouraged to call for fluid symptoms.  Follow-up plan: ICM clinic phone appointment on 03/31/2016. Office appointment with Truitt Merle, NP 03/01/2016  Copy of ICM check sent to primary cardiologist and device physician.   3 month ICM trend: 02/29/2016   1 Year ICM trend:      Rosalene Billings, RN 02/29/2016 3:29 PM

## 2016-03-01 ENCOUNTER — Ambulatory Visit (INDEPENDENT_AMBULATORY_CARE_PROVIDER_SITE_OTHER): Payer: Medicare Other | Admitting: Pharmacist

## 2016-03-01 ENCOUNTER — Ambulatory Visit (INDEPENDENT_AMBULATORY_CARE_PROVIDER_SITE_OTHER): Payer: Medicare Other | Admitting: Nurse Practitioner

## 2016-03-01 ENCOUNTER — Encounter: Payer: Self-pay | Admitting: Nurse Practitioner

## 2016-03-01 VITALS — BP 170/90 | HR 65 | Ht 72.0 in | Wt 203.0 lb

## 2016-03-01 DIAGNOSIS — Z95810 Presence of automatic (implantable) cardiac defibrillator: Secondary | ICD-10-CM | POA: Diagnosis not present

## 2016-03-01 DIAGNOSIS — I480 Paroxysmal atrial fibrillation: Secondary | ICD-10-CM | POA: Diagnosis not present

## 2016-03-01 DIAGNOSIS — Z7901 Long term (current) use of anticoagulants: Secondary | ICD-10-CM

## 2016-03-01 DIAGNOSIS — I428 Other cardiomyopathies: Secondary | ICD-10-CM | POA: Diagnosis not present

## 2016-03-01 DIAGNOSIS — I4891 Unspecified atrial fibrillation: Secondary | ICD-10-CM | POA: Diagnosis not present

## 2016-03-01 DIAGNOSIS — Z5181 Encounter for therapeutic drug level monitoring: Secondary | ICD-10-CM

## 2016-03-01 DIAGNOSIS — I5022 Chronic systolic (congestive) heart failure: Secondary | ICD-10-CM

## 2016-03-01 LAB — POCT INR: INR: 1.6

## 2016-03-01 NOTE — Patient Instructions (Addendum)
We will be checking the following labs today - BMET, CBC, HPF and TSH  Needs INR today  Need your coumadin checked EVERY month  Medication Instructions:    Continue with your current medicines.     Testing/Procedures To Be Arranged:  N/A  Follow-Up:   See me in 4 months for fasting labs/EKG    Other Special Instructions:   Think about what we talked about today.     If you need a refill on your cardiac medications before your next appointment, please call your pharmacy.   Call the [place] office at [phone] if you have any questions, problems or concerns.

## 2016-03-02 LAB — BASIC METABOLIC PANEL
BUN/Creatinine Ratio: 13 (ref 10–24)
BUN: 18 mg/dL (ref 8–27)
CO2: 25 mmol/L (ref 18–29)
Calcium: 9.3 mg/dL (ref 8.6–10.2)
Chloride: 98 mmol/L (ref 96–106)
Creatinine, Ser: 1.39 mg/dL — ABNORMAL HIGH (ref 0.76–1.27)
GFR calc Af Amer: 56 mL/min/{1.73_m2} — ABNORMAL LOW (ref >59)
GFR calc non Af Amer: 48 mL/min/{1.73_m2} — ABNORMAL LOW (ref >59)
Glucose: 123 mg/dL — ABNORMAL HIGH (ref 65–99)
Potassium: 4.6 mmol/L (ref 3.5–5.2)
Sodium: 138 mmol/L (ref 134–144)

## 2016-03-02 LAB — HEPATIC FUNCTION PANEL
ALT: 60 IU/L — ABNORMAL HIGH (ref 0–44)
AST: 81 IU/L — ABNORMAL HIGH (ref 0–40)
Albumin: 4.5 g/dL (ref 3.5–4.8)
Alkaline Phosphatase: 95 IU/L (ref 39–117)
Bilirubin Total: 0.5 mg/dL (ref 0.0–1.2)
Bilirubin, Direct: 0.17 mg/dL (ref 0.00–0.40)
Total Protein: 7.5 g/dL (ref 6.0–8.5)

## 2016-03-02 LAB — CBC
Hematocrit: 41 % (ref 37.5–51.0)
Hemoglobin: 13.6 g/dL (ref 13.0–17.7)
MCH: 30.9 pg (ref 26.6–33.0)
MCHC: 33.2 g/dL (ref 31.5–35.7)
MCV: 93 fL (ref 79–97)
Platelets: 209 10*3/uL (ref 150–379)
RBC: 4.4 x10E6/uL (ref 4.14–5.80)
RDW: 13.9 % (ref 12.3–15.4)
WBC: 4.8 10*3/uL (ref 3.4–10.8)

## 2016-03-02 LAB — TSH: TSH: 58.67 u[IU]/mL — ABNORMAL HIGH (ref 0.450–4.500)

## 2016-03-03 ENCOUNTER — Encounter: Payer: Self-pay | Admitting: Internal Medicine

## 2016-03-03 ENCOUNTER — Ambulatory Visit (INDEPENDENT_AMBULATORY_CARE_PROVIDER_SITE_OTHER): Payer: Medicare Other | Admitting: Internal Medicine

## 2016-03-03 ENCOUNTER — Ambulatory Visit: Payer: Medicare Other | Admitting: Internal Medicine

## 2016-03-03 VITALS — BP 132/78 | HR 66 | Temp 97.6°F | Resp 16 | Ht 72.0 in | Wt 204.1 lb

## 2016-03-03 DIAGNOSIS — E032 Hypothyroidism due to medicaments and other exogenous substances: Secondary | ICD-10-CM | POA: Insufficient documentation

## 2016-03-03 DIAGNOSIS — K7010 Alcoholic hepatitis without ascites: Secondary | ICD-10-CM | POA: Diagnosis not present

## 2016-03-03 MED ORDER — LEVOTHYROXINE SODIUM 125 MCG PO TABS
125.0000 ug | ORAL_TABLET | Freq: Every day | ORAL | 1 refills | Status: DC
Start: 2016-03-03 — End: 2016-09-19

## 2016-03-03 NOTE — Patient Instructions (Signed)
Hypothyroidism Hypothyroidism is a disorder of the thyroid. The thyroid is a large gland that is located in the lower front of the neck. The thyroid releases hormones that control how the body works. With hypothyroidism, the thyroid does not make enough of these hormones. What are the causes? Causes of hypothyroidism may include:  Viral infections.  Pregnancy.  Your own defense system (immune system) attacking your thyroid.  Certain medicines.  Birth defects.  Past radiation treatments to your head or neck.  Past treatment with radioactive iodine.  Past surgical removal of part or all of your thyroid.  Problems with the gland that is located in the center of your brain (pituitary).  What are the signs or symptoms? Signs and symptoms of hypothyroidism may include:  Feeling as though you have no energy (lethargy).  Inability to tolerate cold.  Weight gain that is not explained by a change in diet or exercise habits.  Dry skin.  Coarse hair.  Menstrual irregularity.  Slowing of thought processes.  Constipation.  Sadness or depression.  How is this diagnosed? Your health care provider may diagnose hypothyroidism with blood tests and ultrasound tests. How is this treated? Hypothyroidism is treated with medicine that replaces the hormones that your body does not make. After you begin treatment, it may take several weeks for symptoms to go away. Follow these instructions at home:  Take medicines only as directed by your health care provider.  If you start taking any new medicines, tell your health care provider.  Keep all follow-up visits as directed by your health care provider. This is important. As your condition improves, your dosage needs may change. You will need to have blood tests regularly so that your health care provider can watch your condition. Contact a health care provider if:  Your symptoms do not get better with treatment.  You are taking thyroid  replacement medicine and: ? You sweat excessively. ? You have tremors. ? You feel anxious. ? You lose weight rapidly. ? You cannot tolerate heat. ? You have emotional swings. ? You have diarrhea. ? You feel weak. Get help right away if:  You develop chest pain.  You develop an irregular heartbeat.  You develop a rapid heartbeat. This information is not intended to replace advice given to you by your health care provider. Make sure you discuss any questions you have with your health care provider. Document Released: 01/10/2005 Document Revised: 06/18/2015 Document Reviewed: 05/28/2013 Elsevier Interactive Patient Education  2017 [employer]  

## 2016-03-03 NOTE — Progress Notes (Signed)
Pre visit review using our clinic review tool, if applicable. No additional management support is needed unless otherwise documented below in the visit note. 

## 2016-03-03 NOTE — Progress Notes (Signed)
Subjective:  Patient ID: Corey [name], male    DOB: 1935-12-09  Age: 81 y.o. MRN: [id number]  CC: Hypothyroidism   HPI Corey [name] presents for Concerns about hypothyroidism. He complains of fatigue and a 10 pound weight gain. 2 months ago his TSH was suppressed. He was seen recently in cardiology and his TSH was significantly elevated. He is on amiodarone for heart rhythm control.  Outpatient Medications Prior to Visit  Medication Sig Dispense Refill  . albuterol (PROVENTIL HFA;VENTOLIN HFA) 108 (90 BASE) MCG/ACT inhaler Inhale 1-2 puffs into the lungs every 6 (six) hours as needed for shortness of breath. 1 Inhaler 11  . amiodarone (PACERONE) 200 MG tablet Take 0.5 tablets (100 mg total) by mouth daily. 45 tablet 3  . Cyanocobalamin (B-12) 100 MCG TABS Take by mouth.    . folic acid (FOLVITE) 1 MG tablet TAKE ONE TABLET EACH DAY 90 tablet 3  . metoprolol succinate (TOPROL-XL) 50 MG 24 hr tablet Take 1/2 tablet by mouth every evening 15 tablet 11  . mometasone (NASONEX) 50 MCG/ACT nasal spray 1-2 puffs each nostril once or twice daily if needed 17 g 0  . pantoprazole (PROTONIX) 40 MG tablet Take 1 tablet (40 mg total) by mouth daily. 90 tablet 3  . polyethylene glycol powder (GLYCOLAX/MIRALAX) powder Take 17 g by mouth daily as needed (constipation).     . rosuvastatin (CRESTOR) 5 MG tablet TAKE ONE TABLET IN THE EVENING 30 tablet 11  . sertraline (ZOLOFT) 50 MG tablet TAKE ONE TABLET EACH DAY 30 tablet 2  . spironolactone (ALDACTONE) 25 MG tablet TAKE ONE TABLET BY MOUTH ONCE DAILY 30 tablet 11  . warfarin (COUMADIN) 2.5 MG tablet TAKE AS DIRECTED BY COUMADIN CLINIC 35 tablet 2  . albuterol (ACCUNEB) 0.63 MG/3ML nebulizer solution Inhale into the lungs.    . clonazePAM (KLONOPIN) 0.5 MG tablet TAKE 1/2 TO 2 TABLETS AS NEEDED FOR SLEEP (Patient not taking: Reported on 03/03/2016) 30 tablet 2  . levalbuterol (XOPENEX) 1.25 MG/3ML nebulizer solution Inhale into the lungs.    .  NONFORMULARY OR COMPOUNDED ITEM Allergy Vaccine 1:10 Given at [facility]    . promethazine-dextromethorphan (PROMETHAZINE-DM) 6.25-15 MG/5ML syrup Take 5 mLs by mouth 4 (four) times daily as needed for cough. (Patient not taking: Reported on 03/03/2016) 118 mL 0  . umeclidinium-vilanterol (ANORO ELLIPTA) 62.5-25 MCG/INH AEPB Inhale 1 puff into the lungs daily. (Patient not taking: Reported on 03/03/2016) 90 each 3   No facility-administered medications prior to visit.     ROS Review of Systems  Constitutional: Positive for fatigue. Negative for diaphoresis and unexpected weight change.  HENT: Negative.   Eyes: Negative for visual disturbance.  Respiratory: Negative for chest tightness, shortness of breath and wheezing.   Cardiovascular: Negative for chest pain, palpitations and leg swelling.  Gastrointestinal: Negative for abdominal pain, constipation, diarrhea, nausea and vomiting.  Endocrine: Positive for cold intolerance. Negative for heat intolerance.  Genitourinary: Negative.  Negative for difficulty urinating.  Musculoskeletal: Negative.  Negative for arthralgias, back pain, myalgias and neck pain.  Skin: Negative.  Negative for color change and pallor.  Neurological: Negative.  Negative for dizziness, syncope, weakness and light-headedness.  Hematological: Negative.  Negative for adenopathy. Does not bruise/bleed easily.  Psychiatric/Behavioral: Negative.     Objective:  BP 132/78 (BP Location: Left Arm, Patient Position: Sitting, Cuff Size: Normal)   Pulse 66   Temp 97.6 F (36.4 C) (Oral)   Resp 16   Ht 6' (1.829  m)   Wt 204 lb 1.3 oz (92.6 kg)   SpO2 96%   BMI 27.68 kg/m   BP Readings from Last 3 Encounters:  03/03/16 132/78  03/01/16 (!) 170/90  01/04/16 120/82    Wt Readings from Last 3 Encounters:  03/03/16 204 lb 1.3 oz (92.6 kg)  03/01/16 203 lb (92.1 kg)  01/04/16 195 lb 8 oz (88.7 kg)    Physical Exam  Constitutional: He is oriented to person,  place, and time. No distress.  HENT:  Mouth/Throat: Oropharynx is clear and moist. No oropharyngeal exudate.  Eyes: Conjunctivae are normal. Right eye exhibits no discharge. Left eye exhibits no discharge. No scleral icterus.  Neck: Normal range of motion. Neck supple. No JVD present. No tracheal deviation present. No thyromegaly present.  Cardiovascular: Normal rate, regular rhythm, normal heart sounds and intact distal pulses.  Exam reveals no gallop and no friction rub.   No murmur heard. Pulmonary/Chest: Effort normal and breath sounds normal. No stridor. No respiratory distress. He has no wheezes. He has no rales. He exhibits no tenderness.  Abdominal: Soft. Bowel sounds are normal. He exhibits no distension and no mass. There is no tenderness. There is no rebound and no guarding.  Musculoskeletal: Normal range of motion. He exhibits no edema, tenderness or deformity.  Lymphadenopathy:    He has no cervical adenopathy.  Neurological: He is oriented to person, place, and time.  Skin: Skin is warm and dry. No rash noted. He is not diaphoretic. No erythema. No pallor.  Psychiatric: He has a normal mood and affect. His behavior is normal. Judgment and thought content normal.  Vitals reviewed.   Lab Results  Component Value Date   WBC 4.8 03/01/2016   HGB 13.4 12/01/2015   HCT 41.0 03/01/2016   PLT 209 03/01/2016   GLUCOSE 123 (H) 03/01/2016   CHOL 183 12/01/2015   TRIG 184 (H) 12/01/2015   HDL 42 12/01/2015   LDLCALC 104 (H) 12/01/2015   ALT 60 (H) 03/01/2016   AST 81 (H) 03/01/2016   NA 138 03/01/2016   K 4.6 03/01/2016   CL 98 03/01/2016   CREATININE 1.39 (H) 03/01/2016   BUN 18 03/01/2016   CO2 25 03/01/2016   TSH 58.670 (H) 03/01/2016   PSA 0.02 (L) 11/13/2013   INR 1.6 03/01/2016   HGBA1C 5.5 11/18/2014    Dg Chest 2 View  Result Date: 12/31/2015 CLINICAL DATA:  Productive cough. EXAM: CHEST  2 VIEW COMPARISON:  Radiograph of October 05, 2014. FINDINGS: The heart  size and mediastinal contours are within normal limits. Both lungs are clear. No pneumothorax or pleural effusion is noted. Stable left-sided pacemaker is noted. The visualized skeletal structures are unremarkable. IMPRESSION: No active cardiopulmonary disease. Electronically Signed   By: Marijo Conception, M.D.   On: 12/31/2015 15:09    Assessment & Plan:   Chibuikem was seen today for hypothyroidism.  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 Will start levothyroxine dosed at 1.5 mg x his wt.in kilograms. Return in 2-3 months for me to recheck his TSH. -     levothyroxine (SYNTHROID, LEVOTHROID) 125 MCG tablet; Take 1 tablet (125 mcg total) by mouth daily.  Chronic alcoholic hepatitis- I asked him to abstain from alcohol intake. He tells me that he will try to abstain and/or severely limit his alcohol intake.   I am having Mr. Scacco start on levothyroxine. I am also having  him maintain his polyethylene glycol powder, albuterol, NONFORMULARY OR COMPOUNDED ITEM, B-12, metoprolol succinate, clonazePAM, spironolactone, amiodarone, levalbuterol, albuterol, umeclidinium-vilanterol, folic acid, rosuvastatin, warfarin, mometasone, promethazine-dextromethorphan, pantoprazole, and sertraline.  Meds ordered this encounter  Medications  . levothyroxine (SYNTHROID, LEVOTHROID) 125 MCG tablet    Sig: Take 1 tablet (125 mcg total) by mouth daily.    Dispense:  90 tablet    Refill:  1     Follow-up: Return in about 2 months (around 05/01/2016).  Scarlette Calico, MD

## 2016-03-09 NOTE — Telephone Encounter (Signed)
Pt will make appt with new MD. Nothing more needed at this time.

## 2016-03-21 ENCOUNTER — Telehealth: Payer: Self-pay | Admitting: Cardiology

## 2016-03-21 NOTE — Telephone Encounter (Signed)
LMOVM requesting that pt send manual transmission b/c home monitor has not updated in at least 7 days.    

## 2016-03-22 ENCOUNTER — Ambulatory Visit: Payer: Medicare Other | Admitting: Gastroenterology

## 2016-03-28 ENCOUNTER — Ambulatory Visit (INDEPENDENT_AMBULATORY_CARE_PROVIDER_SITE_OTHER): Payer: Medicare Other | Admitting: *Deleted

## 2016-03-28 ENCOUNTER — Telehealth: Payer: Self-pay | Admitting: Internal Medicine

## 2016-03-28 DIAGNOSIS — I4891 Unspecified atrial fibrillation: Secondary | ICD-10-CM

## 2016-03-28 DIAGNOSIS — Z7901 Long term (current) use of anticoagulants: Secondary | ICD-10-CM | POA: Diagnosis not present

## 2016-03-28 DIAGNOSIS — Z5181 Encounter for therapeutic drug level monitoring: Secondary | ICD-10-CM

## 2016-03-28 LAB — POCT INR: INR: 1.6

## 2016-03-28 NOTE — Telephone Encounter (Signed)
F/u Message  Pt returning RN call about sending a transmission. Please call back to discuss

## 2016-03-28 NOTE — Telephone Encounter (Signed)
Spoke w/ pt and informed him that his home monitor is currently updated, and he doesn't need to do anything. Pt verbalized understanding.

## 2016-03-30 ENCOUNTER — Ambulatory Visit (INDEPENDENT_AMBULATORY_CARE_PROVIDER_SITE_OTHER)
Admission: RE | Admit: 2016-03-30 | Discharge: 2016-03-30 | Disposition: A | Discharge status: Home / Self Care | Payer: Medicare Other | Source: Ambulatory Visit | Attending: Nurse Practitioner | Admitting: Nurse Practitioner

## 2016-03-30 ENCOUNTER — Ambulatory Visit (INDEPENDENT_AMBULATORY_CARE_PROVIDER_SITE_OTHER): Payer: Medicare Other | Admitting: Nurse Practitioner

## 2016-03-30 ENCOUNTER — Encounter: Payer: Self-pay | Admitting: Nurse Practitioner

## 2016-03-30 VITALS — BP 140/84 | HR 67 | Temp 98.3°F | Ht 72.0 in | Wt 196.0 lb

## 2016-03-30 DIAGNOSIS — J209 Acute bronchitis, unspecified: Secondary | ICD-10-CM

## 2016-03-30 DIAGNOSIS — R05 Cough: Secondary | ICD-10-CM | POA: Diagnosis not present

## 2016-03-30 NOTE — Patient Instructions (Addendum)
Go to basement for CXR You will be called with results.(call cell phone).  Continue inhalers as prescribed. May also use Robitussin Dm or Tussin Dm OTC for cough.

## 2016-03-30 NOTE — Progress Notes (Signed)
Subjective:  Patient ID: Corey [name], male    DOB: 25-May-1935  Age: 81 y.o. MRN: [id number]  CC: Cough (coughing, congestion going on for 6 wks/saw doctor in the ship--not better)  Cough  This is a new problem. The current episode started more than 1 month ago. The problem has been waxing and waning. The problem occurs constantly. The cough is productive of sputum. Associated symptoms include nasal congestion, postnasal drip and rhinorrhea. Pertinent negatives include no chest pain, chills, ear congestion, ear pain, fever, headaches, heartburn, hemoptysis, myalgias, rash, sore throat, shortness of breath, sweats, weight loss or wheezing. The symptoms are aggravated by cold air and lying down. Risk factors for lung disease include travel. He has tried OTC cough suppressant, prescription cough suppressant, steroid inhaler and a beta-agonist inhaler (and azithromycin) for the symptoms. The treatment provided significant relief. His past medical history is significant for bronchitis, COPD and environmental allergies. There is no history of asthma, bronchiectasis, emphysema or pneumonia.   On [place] ship [place], treated with azithromycin x 2courses, cloricidin and Tussin.  Outpatient Medications Prior to Visit  Medication Sig Dispense Refill  . albuterol (ACCUNEB) 0.63 MG/3ML nebulizer solution Inhale into the lungs.    Marland Kitchen albuterol (PROVENTIL HFA;VENTOLIN HFA) 108 (90 BASE) MCG/ACT inhaler Inhale 1-2 puffs into the lungs every 6 (six) hours as needed for shortness of breath. 1 Inhaler 11  . amiodarone (PACERONE) 200 MG tablet Take 0.5 tablets (100 mg total) by mouth daily. 45 tablet 3  . clonazePAM (KLONOPIN) 0.5 MG tablet TAKE 1/2 TO 2 TABLETS AS NEEDED FOR SLEEP 30 tablet 2  . Cyanocobalamin (B-12) 100 MCG TABS Take by mouth.    . folic acid (FOLVITE) 1 MG tablet TAKE ONE TABLET EACH DAY 90 tablet 3  . levalbuterol (XOPENEX) 1.25 MG/3ML nebulizer solution Inhale into the lungs.    Marland Kitchen  levothyroxine (SYNTHROID, LEVOTHROID) 125 MCG tablet Take 1 tablet (125 mcg total) by mouth daily. 90 tablet 1  . metoprolol succinate (TOPROL-XL) 50 MG 24 hr tablet Take 1/2 tablet by mouth every evening 15 tablet 11  . mometasone (NASONEX) 50 MCG/ACT nasal spray 1-2 puffs each nostril once or twice daily if needed 17 g 0  . NONFORMULARY OR COMPOUNDED ITEM Allergy Vaccine 1:10 Given at [facility]    . pantoprazole (PROTONIX) 40 MG tablet Take 1 tablet (40 mg total) by mouth daily. 90 tablet 3  . polyethylene glycol powder (GLYCOLAX/MIRALAX) powder Take 17 g by mouth daily as needed (constipation).     . promethazine-dextromethorphan (PROMETHAZINE-DM) 6.25-15 MG/5ML syrup Take 5 mLs by mouth 4 (four) times daily as needed for cough. 118 mL 0  . rosuvastatin (CRESTOR) 5 MG tablet TAKE ONE TABLET IN THE EVENING 30 tablet 11  . sertraline (ZOLOFT) 50 MG tablet TAKE ONE TABLET EACH DAY 30 tablet 2  . spironolactone (ALDACTONE) 25 MG tablet TAKE ONE TABLET BY MOUTH ONCE DAILY 30 tablet 11  . umeclidinium-vilanterol (ANORO ELLIPTA) 62.5-25 MCG/INH AEPB Inhale 1 puff into the lungs daily. 90 each 3  . warfarin (COUMADIN) 2.5 MG tablet TAKE AS DIRECTED BY COUMADIN CLINIC 35 tablet 2   No facility-administered medications prior to visit.     ROS See HPI  Objective:  BP 140/84   Pulse 67   Temp 98.3 F (36.8 C)   Ht 6' (1.829 m)   Wt 196 lb (88.9 kg)   SpO2 96%   BMI 26.58 kg/m   BP Readings from Last 3 Encounters:  03/30/16 140/84  03/03/16 132/78  03/01/16 (!) 170/90    Wt Readings from Last 3 Encounters:  03/30/16 196 lb (88.9 kg)  03/03/16 204 lb 1.3 oz (92.6 kg)  03/01/16 203 lb (92.1 kg)    Physical Exam  Constitutional: He is oriented to person, place, and time. No distress.  HENT:  Right Ear: Tympanic membrane, external ear and ear canal normal.  Left Ear: Tympanic membrane, external ear and ear canal normal.  Nose: Mucosal edema and rhinorrhea present. Right  sinus exhibits no maxillary sinus tenderness and no frontal sinus tenderness. Left sinus exhibits no maxillary sinus tenderness and no frontal sinus tenderness.  Mouth/Throat: Uvula is midline. Posterior oropharyngeal erythema present. No oropharyngeal exudate.  Eyes: No scleral icterus.  Neck: Normal range of motion. Neck supple.  Cardiovascular: Normal rate and regular rhythm.   Pulmonary/Chest: Effort normal and breath sounds normal.  Musculoskeletal: He exhibits no edema.  Lymphadenopathy:    He has no cervical adenopathy.  Neurological: He is alert and oriented to person, place, and time.  Vitals reviewed.   Lab Results  Component Value Date   WBC 4.8 03/01/2016   HGB 13.4 12/01/2015   HCT 41.0 03/01/2016   PLT 209 03/01/2016   GLUCOSE 123 (H) 03/01/2016   CHOL 183 12/01/2015   TRIG 184 (H) 12/01/2015   HDL 42 12/01/2015   LDLCALC 104 (H) 12/01/2015   ALT 60 (H) 03/01/2016   AST 81 (H) 03/01/2016   NA 138 03/01/2016   K 4.6 03/01/2016   CL 98 03/01/2016   CREATININE 1.39 (H) 03/01/2016   BUN 18 03/01/2016   CO2 25 03/01/2016   TSH 58.670 (H) 03/01/2016   PSA 0.02 (L) 11/13/2013   INR 1.6 03/28/2016   HGBA1C 5.5 11/18/2014    Dg Chest 2 View  Result Date: 12/31/2015 CLINICAL DATA:  Productive cough. EXAM: CHEST  2 VIEW COMPARISON:  Radiograph of October 05, 2014. FINDINGS: The heart size and mediastinal contours are within normal limits. Both lungs are clear. No pneumothorax or pleural effusion is noted. Stable left-sided pacemaker is noted. The visualized skeletal structures are unremarkable. IMPRESSION: No active cardiopulmonary disease. Electronically Signed   By: Marijo Conception, M.D.   On: 12/31/2015 15:09    Assessment & Plan:   Corey [name] was seen today for cough.  Diagnoses and all orders for this visit:  Acute bronchitis, unspecified organism -     DG Chest 2 View; Future   I am having Corey [name] maintain his polyethylene glycol powder, albuterol,  NONFORMULARY OR COMPOUNDED ITEM, B-12, metoprolol succinate, clonazePAM, spironolactone, amiodarone, levalbuterol, albuterol, umeclidinium-vilanterol, folic acid, rosuvastatin, warfarin, mometasone, promethazine-dextromethorphan, pantoprazole, sertraline, and levothyroxine.  No orders of the defined types were placed in this encounter.   Follow-up: Return if symptoms worsen or fail to improve.  Wilfred Lacy, NP

## 2016-03-30 NOTE — Progress Notes (Signed)
Pre visit review using our clinic review tool, if applicable. No additional management support is needed unless otherwise documented below in the visit note. 

## 2016-03-31 ENCOUNTER — Encounter: Payer: Medicare Other | Admitting: *Deleted

## 2016-03-31 ENCOUNTER — Telehealth: Payer: Self-pay | Admitting: Cardiology

## 2016-03-31 NOTE — Telephone Encounter (Signed)
Spoke with pt and reminded pt of remote transmission that is due today. Pt verbalized understanding and said he would do this on Monday afternoon when he gets home.

## 2016-04-01 ENCOUNTER — Encounter: Payer: Self-pay | Admitting: Cardiology

## 2016-04-07 NOTE — Progress Notes (Signed)
No ICM remote transmission received for 03/31/2016 and next ICM transmission scheduled for 04/26/2016.

## 2016-04-09 ENCOUNTER — Other Ambulatory Visit: Payer: Self-pay | Admitting: Internal Medicine

## 2016-04-11 ENCOUNTER — Ambulatory Visit (INDEPENDENT_AMBULATORY_CARE_PROVIDER_SITE_OTHER): Payer: Medicare Other | Admitting: Pharmacist

## 2016-04-11 DIAGNOSIS — F4323 Adjustment disorder with mixed anxiety and depressed mood: Secondary | ICD-10-CM | POA: Diagnosis not present

## 2016-04-11 DIAGNOSIS — Z5181 Encounter for therapeutic drug level monitoring: Secondary | ICD-10-CM

## 2016-04-11 DIAGNOSIS — I4891 Unspecified atrial fibrillation: Secondary | ICD-10-CM

## 2016-04-11 DIAGNOSIS — Z7901 Long term (current) use of anticoagulants: Secondary | ICD-10-CM

## 2016-04-11 LAB — POCT INR: INR: 2

## 2016-04-13 ENCOUNTER — Other Ambulatory Visit: Payer: Self-pay | Admitting: Neurology

## 2016-04-21 DIAGNOSIS — F4323 Adjustment disorder with mixed anxiety and depressed mood: Secondary | ICD-10-CM | POA: Diagnosis not present

## 2016-04-26 ENCOUNTER — Ambulatory Visit (INDEPENDENT_AMBULATORY_CARE_PROVIDER_SITE_OTHER): Payer: Medicare Other | Admitting: *Deleted

## 2016-04-26 DIAGNOSIS — Z95810 Presence of automatic (implantable) cardiac defibrillator: Secondary | ICD-10-CM

## 2016-04-26 DIAGNOSIS — I428 Other cardiomyopathies: Secondary | ICD-10-CM | POA: Diagnosis not present

## 2016-04-26 DIAGNOSIS — I5022 Chronic systolic (congestive) heart failure: Secondary | ICD-10-CM

## 2016-04-26 NOTE — Progress Notes (Signed)
Remote ICD transmission.   

## 2016-04-26 NOTE — Progress Notes (Signed)
EPIC Encounter for ICM Monitoring  Patient Name: Corey [name] is a 81 y.o. male Date: 04/26/2016 Primary Care Physican: Scarlette Calico, MD Primary Cardiologist:Lori Gerhardt, NP Electrophysiologist: Allred Dry Weight: 196 lbs  Bi-V Pacing:  98%       Heart Failure questions reviewed, pt symptomatic with weight gain.   He reports baseline weight as 192 lbs.   He thinks he weight gain is from eating too much.  Explained that increase in food intake usually results in an increase in salt intake which causes fluid retention.    Thoracic impedance abnormal suggesting fluid accumulation since 04/10/2016.  Recommendations: Advised to cut salt intake down and limit to 2000 mg daily.  He said he will try but he is going on vacation tomorrow and will not be back until next Thursday or Friday.   Follow-up plan: ICM clinic phone appointment on 05/09/2016 when patient returns from vacation.   Copy of ICM check sent to primary cardiologist and device physician for review.   3 month ICM trend: 04/25/2016   1 Year ICM trend:      Rosalene Billings, RN 04/26/2016 4:56 PM

## 2016-04-27 DIAGNOSIS — L821 Other seborrheic keratosis: Secondary | ICD-10-CM | POA: Diagnosis not present

## 2016-04-27 DIAGNOSIS — Z85828 Personal history of other malignant neoplasm of skin: Secondary | ICD-10-CM | POA: Diagnosis not present

## 2016-04-27 DIAGNOSIS — D1801 Hemangioma of skin and subcutaneous tissue: Secondary | ICD-10-CM | POA: Diagnosis not present

## 2016-04-27 DIAGNOSIS — L570 Actinic keratosis: Secondary | ICD-10-CM | POA: Diagnosis not present

## 2016-04-27 DIAGNOSIS — L118 Other specified acantholytic disorders: Secondary | ICD-10-CM | POA: Diagnosis not present

## 2016-04-27 DIAGNOSIS — L820 Inflamed seborrheic keratosis: Secondary | ICD-10-CM | POA: Diagnosis not present

## 2016-04-27 LAB — CUP PACEART REMOTE DEVICE CHECK
Battery Remaining Longevity: 34 mo
Battery Remaining Percentage: 42 %
Brady Statistic AS VP Percent: 3.2 %
Brady Statistic AS VS Percent: 1 %
Date Time Interrogation Session: 20180402145547
HIGH POWER IMPEDANCE MEASURED VALUE: 71 Ohm
HighPow Impedance: 71 Ohm
Implantable Lead Implant Date: 20131223
Implantable Lead Location: 753858
Implantable Lead Model: 4136
Implantable Pulse Generator Implant Date: 20131223
Lead Channel Impedance Value: 490 Ohm
Lead Channel Pacing Threshold Amplitude: 0.75 V
Lead Channel Sensing Intrinsic Amplitude: 3.1 mV
Lead Channel Setting Pacing Amplitude: 2 V
Lead Channel Setting Pacing Amplitude: 2.5 V
Lead Channel Setting Pacing Pulse Width: 0.5 ms
Lead Channel Setting Pacing Pulse Width: 0.5 ms
MDC IDC LEAD IMPLANT DT: 20130614
MDC IDC LEAD IMPLANT DT: 20131223
MDC IDC LEAD LOCATION: 753859
MDC IDC LEAD LOCATION: 753860
MDC IDC LEAD SERIAL: 29201222
MDC IDC MSMT BATTERY VOLTAGE: 2.9 V
MDC IDC MSMT LEADCHNL LV IMPEDANCE VALUE: 1050 Ohm
MDC IDC MSMT LEADCHNL LV PACING THRESHOLD AMPLITUDE: 0.75 V
MDC IDC MSMT LEADCHNL LV PACING THRESHOLD PULSEWIDTH: 0.5 ms
MDC IDC MSMT LEADCHNL RA PACING THRESHOLD PULSEWIDTH: 0.5 ms
MDC IDC MSMT LEADCHNL RV IMPEDANCE VALUE: 430 Ohm
MDC IDC MSMT LEADCHNL RV PACING THRESHOLD AMPLITUDE: 1.25 V
MDC IDC MSMT LEADCHNL RV PACING THRESHOLD PULSEWIDTH: 0.5 ms
MDC IDC MSMT LEADCHNL RV SENSING INTR AMPL: 12 mV
MDC IDC SET LEADCHNL LV PACING AMPLITUDE: 2 V
MDC IDC SET LEADCHNL RV SENSING SENSITIVITY: 0.5 mV
MDC IDC STAT BRADY AP VP PERCENT: 95 %
MDC IDC STAT BRADY AP VS PERCENT: 1 %
MDC IDC STAT BRADY RA PERCENT PACED: 96 %
Pulse Gen Serial Number: 7057550

## 2016-05-02 ENCOUNTER — Ambulatory Visit (INDEPENDENT_AMBULATORY_CARE_PROVIDER_SITE_OTHER): Payer: Medicare Other | Admitting: *Deleted

## 2016-05-02 DIAGNOSIS — Z5181 Encounter for therapeutic drug level monitoring: Secondary | ICD-10-CM | POA: Diagnosis not present

## 2016-05-02 DIAGNOSIS — Z7901 Long term (current) use of anticoagulants: Secondary | ICD-10-CM

## 2016-05-02 DIAGNOSIS — I4891 Unspecified atrial fibrillation: Secondary | ICD-10-CM

## 2016-05-02 LAB — POCT INR: INR: 2.4

## 2016-05-07 ENCOUNTER — Other Ambulatory Visit: Payer: Self-pay | Admitting: Internal Medicine

## 2016-05-09 ENCOUNTER — Ambulatory Visit (INDEPENDENT_AMBULATORY_CARE_PROVIDER_SITE_OTHER): Payer: Medicare Other

## 2016-05-09 DIAGNOSIS — I5022 Chronic systolic (congestive) heart failure: Secondary | ICD-10-CM | POA: Diagnosis not present

## 2016-05-09 DIAGNOSIS — Z95810 Presence of automatic (implantable) cardiac defibrillator: Secondary | ICD-10-CM

## 2016-05-09 NOTE — Progress Notes (Signed)
EPIC Encounter for ICM Monitoring  Patient Name: Corey [name] is a 81 y.o. male Date: 05/09/2016 Primary Care Physican: Scarlette Calico, MD Primary Cardiologist:Lori Gerhardt, NP Electrophysiologist: Allred Dry Weight:   196 lbs  Bi-V Pacing:  98%       Heart Failure questions reviewed, pt asymptomatic.   Thoracic impedance returned to normal after last ICM check on 04/27/2016.  No diuretic  Recommendations: No changes. Encouraged to call for fluid symptoms.  Follow-up plan: ICM clinic phone appointment on 05/27/2016.    Copy of ICM check sent to device physician.   3 month ICM trend: 05/09/2016   1 Year ICM trend:      Rosalene Billings, RN 05/09/2016 10:06 AM

## 2016-05-26 DIAGNOSIS — C61 Malignant neoplasm of prostate: Secondary | ICD-10-CM | POA: Diagnosis not present

## 2016-05-26 LAB — PSA

## 2016-05-27 ENCOUNTER — Ambulatory Visit (INDEPENDENT_AMBULATORY_CARE_PROVIDER_SITE_OTHER): Payer: Medicare Other

## 2016-05-27 DIAGNOSIS — Z95810 Presence of automatic (implantable) cardiac defibrillator: Secondary | ICD-10-CM

## 2016-05-27 DIAGNOSIS — I5022 Chronic systolic (congestive) heart failure: Secondary | ICD-10-CM | POA: Diagnosis not present

## 2016-05-27 NOTE — Progress Notes (Signed)
EPIC Encounter for ICM Monitoring  Patient Name: Corey [name] is a 81 y.o. male Date: 05/27/2016 Primary Care Physican: Scarlette Calico, MD Primary Cardiologist:Lori Servando Snare, NP Electrophysiologist: Allred Dry Weight:Last ICM weight 96lbs  Bi-V Pacing:  98%           Spoke with wife due to patient was not home.  She said patient is feeling fine.  She says sometimes he feels tired and sluggish.  She said he does have mild memory problem but nothing significant.     Thoracic impedance normal.  Prescribed Spironolactone 25 mg 1 tablet daily  Recommendations:  Discussed at length regarding low salt diet.  She reported he eats out a lot and uses salt shaker heavily on his foods.  She has tried to tell him to cut back on salt intake but he doesn't.  Reviewed history of transmissions and pattern of fluid retention in the last 6 moths.  Advised high salt diet can cause the fluid retention.  Explained importance of low salt diet.  No changes today.   Patient will be in [place] at their farm house for month of June and part of July.  Advised to call office and reschedule appointment with Truitt Merle.  Advised for him to take the monitor since he will be gone for 6 weeks and she said they will do that.    Follow-up plan: ICM clinic phone appointment on 07/01/2016.    Copy of ICM check sent to device physician.   3 month ICM trend: 05/27/2016   1 Year ICM trend:      Rosalene Billings, RN 05/27/2016 7:49 AM

## 2016-05-30 ENCOUNTER — Ambulatory Visit (INDEPENDENT_AMBULATORY_CARE_PROVIDER_SITE_OTHER): Payer: Medicare Other | Admitting: *Deleted

## 2016-05-30 DIAGNOSIS — Z7901 Long term (current) use of anticoagulants: Secondary | ICD-10-CM | POA: Diagnosis not present

## 2016-05-30 DIAGNOSIS — F4323 Adjustment disorder with mixed anxiety and depressed mood: Secondary | ICD-10-CM | POA: Diagnosis not present

## 2016-05-30 DIAGNOSIS — Z5181 Encounter for therapeutic drug level monitoring: Secondary | ICD-10-CM

## 2016-05-30 DIAGNOSIS — I4891 Unspecified atrial fibrillation: Secondary | ICD-10-CM | POA: Diagnosis not present

## 2016-05-30 LAB — POCT INR: INR: 2.2

## 2016-05-31 ENCOUNTER — Other Ambulatory Visit: Payer: Self-pay | Admitting: Internal Medicine

## 2016-05-31 ENCOUNTER — Telehealth: Payer: Self-pay | Admitting: Internal Medicine

## 2016-05-31 DIAGNOSIS — F32A Depression, unspecified: Secondary | ICD-10-CM | POA: Insufficient documentation

## 2016-05-31 DIAGNOSIS — F329 Major depressive disorder, single episode, unspecified: Secondary | ICD-10-CM | POA: Insufficient documentation

## 2016-05-31 DIAGNOSIS — F418 Other specified anxiety disorders: Secondary | ICD-10-CM

## 2016-05-31 MED ORDER — SERTRALINE HCL 100 MG PO TABS: 100.0000 mg | ORAL_TABLET | Freq: Every day | ORAL | 3 refills | Status: DC

## 2016-05-31 NOTE — Telephone Encounter (Signed)
RX sent

## 2016-05-31 NOTE — Telephone Encounter (Signed)
Will be sending notes over.  Would like to collaborate with Dr. Ronnald Ramp on increasing dosage of Zoloft up to 100mg .

## 2016-06-01 NOTE — Telephone Encounter (Signed)
Corey [name] - called back stating she did not get script.  States patient is not at this facility.  If you sent scripts to them they can not go electronic.  Can be reached back at [phone].

## 2016-06-01 NOTE — Telephone Encounter (Signed)
Left detailed message to confirm receipt of erx of 100 mg zoloft

## 2016-06-02 DIAGNOSIS — R350 Frequency of micturition: Secondary | ICD-10-CM | POA: Diagnosis not present

## 2016-06-02 DIAGNOSIS — C61 Malignant neoplasm of prostate: Secondary | ICD-10-CM | POA: Diagnosis not present

## 2016-06-02 NOTE — Telephone Encounter (Signed)
Pt informed of increase in zoloft

## 2016-06-03 ENCOUNTER — Emergency Department (HOSPITAL_COMMUNITY)
Admission: EM | Admit: 2016-06-03 | Discharge: 2016-06-03 | Disposition: A | Discharge status: Home / Self Care | Payer: Medicare Other | Attending: Emergency Medicine | Admitting: Emergency Medicine

## 2016-06-03 ENCOUNTER — Emergency Department (HOSPITAL_COMMUNITY): Payer: Medicare Other

## 2016-06-03 ENCOUNTER — Encounter (HOSPITAL_COMMUNITY): Payer: Self-pay | Admitting: *Deleted

## 2016-06-03 DIAGNOSIS — Z79899 Other long term (current) drug therapy: Secondary | ICD-10-CM | POA: Diagnosis not present

## 2016-06-03 DIAGNOSIS — I110 Hypertensive heart disease with heart failure: Secondary | ICD-10-CM | POA: Diagnosis not present

## 2016-06-03 DIAGNOSIS — Y999 Unspecified external cause status: Secondary | ICD-10-CM | POA: Diagnosis not present

## 2016-06-03 DIAGNOSIS — W19XXXA Unspecified fall, initial encounter: Secondary | ICD-10-CM

## 2016-06-03 DIAGNOSIS — Y929 Unspecified place or not applicable: Secondary | ICD-10-CM | POA: Diagnosis not present

## 2016-06-03 DIAGNOSIS — Z859 Personal history of malignant neoplasm, unspecified: Secondary | ICD-10-CM | POA: Insufficient documentation

## 2016-06-03 DIAGNOSIS — S0001XA Abrasion of scalp, initial encounter: Secondary | ICD-10-CM | POA: Diagnosis not present

## 2016-06-03 DIAGNOSIS — Z7901 Long term (current) use of anticoagulants: Secondary | ICD-10-CM | POA: Diagnosis not present

## 2016-06-03 DIAGNOSIS — T07XXXA Unspecified multiple injuries, initial encounter: Secondary | ICD-10-CM

## 2016-06-03 DIAGNOSIS — S60811A Abrasion of right wrist, initial encounter: Secondary | ICD-10-CM | POA: Diagnosis not present

## 2016-06-03 DIAGNOSIS — W108XXA Fall (on) (from) other stairs and steps, initial encounter: Secondary | ICD-10-CM | POA: Insufficient documentation

## 2016-06-03 DIAGNOSIS — Y939 Activity, unspecified: Secondary | ICD-10-CM | POA: Insufficient documentation

## 2016-06-03 DIAGNOSIS — S59902A Unspecified injury of left elbow, initial encounter: Secondary | ICD-10-CM | POA: Diagnosis present

## 2016-06-03 DIAGNOSIS — S199XXA Unspecified injury of neck, initial encounter: Secondary | ICD-10-CM | POA: Diagnosis not present

## 2016-06-03 DIAGNOSIS — G309 Alzheimer's disease, unspecified: Secondary | ICD-10-CM | POA: Diagnosis not present

## 2016-06-03 DIAGNOSIS — Z87891 Personal history of nicotine dependence: Secondary | ICD-10-CM | POA: Diagnosis not present

## 2016-06-03 DIAGNOSIS — S299XXA Unspecified injury of thorax, initial encounter: Secondary | ICD-10-CM | POA: Diagnosis not present

## 2016-06-03 DIAGNOSIS — Z96652 Presence of left artificial knee joint: Secondary | ICD-10-CM | POA: Insufficient documentation

## 2016-06-03 DIAGNOSIS — Z950 Presence of cardiac pacemaker: Secondary | ICD-10-CM | POA: Diagnosis not present

## 2016-06-03 DIAGNOSIS — J45909 Unspecified asthma, uncomplicated: Secondary | ICD-10-CM | POA: Diagnosis not present

## 2016-06-03 DIAGNOSIS — I252 Old myocardial infarction: Secondary | ICD-10-CM | POA: Insufficient documentation

## 2016-06-03 DIAGNOSIS — S0003XA Contusion of scalp, initial encounter: Secondary | ICD-10-CM | POA: Diagnosis not present

## 2016-06-03 DIAGNOSIS — S51012A Laceration without foreign body of left elbow, initial encounter: Secondary | ICD-10-CM | POA: Diagnosis not present

## 2016-06-03 DIAGNOSIS — I5022 Chronic systolic (congestive) heart failure: Secondary | ICD-10-CM | POA: Diagnosis not present

## 2016-06-03 DIAGNOSIS — M542 Cervicalgia: Secondary | ICD-10-CM | POA: Diagnosis not present

## 2016-06-03 DIAGNOSIS — R0781 Pleurodynia: Secondary | ICD-10-CM | POA: Diagnosis not present

## 2016-06-03 LAB — PROTIME-INR
INR: 1.89
PROTHROMBIN TIME: 21.9 s — AB (ref 11.4–15.2)

## 2016-06-03 MED ORDER — TRAMADOL HCL 50 MG PO TABS
50.0000 mg | ORAL_TABLET | Freq: Once | ORAL | Status: AC
  Administered 2016-06-03: 50 mg via ORAL
  Filled 2016-06-03: qty 1

## 2016-06-03 MED ORDER — TRAMADOL HCL 50 MG PO TABS: 50.0000 mg | ORAL_TABLET | Freq: Four times a day (QID) | ORAL | 0 refills | Status: DC | PRN

## 2016-06-03 NOTE — ED Provider Notes (Signed)
[facility] DEPT Provider Note   CSN: [id number] Arrival date & time: 06/03/16  1736     History   Chief Complaint Chief Complaint  Patient presents with  . Fall    HPI Corey [name] is a 81 y.o. male.  The history is provided by the patient and medical records.  Fall     81 year old male with history of anxiety, asthma, A. fib status post pacemaker placement, depression, GERD, hyperlipidemia, coronary artery disease, sleep apnea, CHF, presenting to the ED after a fall. Patient states he was walking up the stairs in his home today. States he reached for the hand rail in the middle of the stairs on his left side, but his grip slipped and he tried to catch himself against the right side of the wall, but it was too much weight so he fell backwards. He initially struck his back on the stairs and then struck his head in the wall. Wife reports there was a significant indention in the drywall. He did not lose consciousness. States now he has headache and right-sided rib pain. States his back feels "sore" but otherwise feels fine. He has not had any numbness or weakness of his extremities. No bowel or bladder incontinence. He's not had any confusion, dizziness, nausea, vomiting, and blurred vision, tinnitus, changes in speech, or difficulty walking. Patient does take Coumadin regularly.  Tetanus is up-to-date.  Past Medical History:  Diagnosis Date  . Alcohol abuse, episodic 05/19/2008  . ALLERGIC RHINITIS 04/15/2009  . Allergy    YEAR ROUND,TAKE SHOTS  . Alzheimer's dementia    beginning with sx  . Anxiety   . Arthritis   . ASTHMA 11/16/2006   sinusitis-    hx ?yeast patch/white patch on vocal cord as per [facility] 02/25/11-   . Atrial fibrillation ([place]) 11/16/2006   remote CHF related to atrial fib with rapid ventricular response over 25 yrs per office note,  . Cancer [facility]) 2002   prostate cancer  . Chronic systolic CHF (congestive heart failure) (HCC)    echo (10/02/12): EF 35-40%,  grade 1 diastolic dysfunction, trivial AI, mild MR, mild RVE  . COLONIC POLYPS, ADENOMATOUS, HX OF 02/14/2008  . CORONARY ARTERY DISEASE 11/16/2006   LHC (07/2011): LAD with luminal irregularities, proximal circumflex 50%, EF 25%  . Depression   . DIVERTICULOSIS, COLON 02/14/2008  . ESOPHAGEAL STRICTURE 01/28/2008  . GERD 02/14/2008  . Headache(784.0)   . HYPERGLYCEMIA 11/16/2006  . HYPERLIPIDEMIA 02/14/2008  . HYPERTENSION 11/16/2006  . ICD (implantable cardiac defibrillator) in place   . INSOMNIA-SLEEP DISORDER-UNSPEC 02/11/2009  . Lesion of vocal cord    bx begine  . Myocardial infarction ([place]) 1993  . NICM (nonischemic cardiomyopathy) ([place])   . OBSTRUCTIVE SLEEP APNEA 11/10/2008  . 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 Pacemaker    St. Jude  . Pneumonia    hx of  . PROSTATE CANCER, HX OF 02/25/2000  . SLEEP APNEA 10/03/2009   LOV Dr [name] 12/12 in EPIC    Moderate per patient- settings ?6/last sleep study years ago  . Sleep apnea   . Stroke ([place])    Tia  . Symptomatic bradycardia, secondary to sinus node dysfunction 07/08/2011   s/p [facility] Scientific PPM by Dr Sallyanne Kuster, upgraded to [place] ICD 12/13 by Dr Rayann Heman (SJM)  . TRANSIENT ISCHEMIC ATTACKS, HX OF 11/16/2006    Patient Active Problem List   Diagnosis Date Noted  . Depression with anxiety 05/31/2016  . 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  Bronchiectasis without acute exacerbation ([place]) 12/31/2015  . Chronic alcoholic hepatitis 42/35/3614  . Obstructive chronic bronchitis without exacerbation ([place]) 11/12/2015  . Spinal stenosis in cervical region 10/02/2015  . Numbness and tingling 09/30/2015  . Gallbladder sludge 03/02/2015  . Alcohol dependence with intoxication with complication ([place]) 43/15/4008  . Other iron deficiency anemias 11/13/2013  . B12 deficiency anemia 11/13/2013  . Encounter for therapeutic drug monitoring 02/25/2013  . Long term (current) use of anticoagulants 05/12/2012  . [employer] 11/01/2011  . Chronic systolic heart failure ([place]) 08/28/2011  . Vocal cord nodule 05/19/2011  . Lumbar spinal stenosis 03/03/2011  . Seasonal and perennial allergic rhinitis 04/15/2009  . Insomnia 02/11/2009  . Obstructive sleep apnea 11/10/2008  . Hyperlipidemia with target LDL less than 100 02/14/2008  . GERD 02/14/2008  . ESOPHAGEAL STRICTURE 01/28/2008  . Essential hypertension 11/16/2006  . Coronary atherosclerosis 11/16/2006  . Atrial fibrillation ([place]) 11/16/2006  . Hyperglycemia 11/16/2006    Past Surgical History:  Procedure Laterality Date  . BACK SURGERY    . BI-VENTRICULAR IMPLANTABLE CARDIOVERTER DEFIBRILLATOR UPGRADE N/A 01/16/2012   Procedure: BI-VENTRICULAR IMPLANTABLE CARDIOVERTER DEFIBRILLATOR UPGRADE;  Surgeon: Thompson Grayer, MD;  Location: [facility] CATH LAB;  Service: Cardiovascular;  Laterality: N/A;  . CARDIAC CATHETERIZATION     several  . CARDIAC DEFIBRILLATOR PLACEMENT  01/16/12   Upgrade to a biventricular SJM ICD by DR Allred  . COLONOSCOPY    . ESOPHAGOGASTRODUODENOSCOPY     with dilitation  . INSERT / REPLACE / REMOVE PACEMAKER    . KNEE ARTHROSCOPY     2 surgeries right and 1 left  . LEFT AND RIGHT HEART CATHETERIZATION WITH CORONARY ANGIOGRAM N/A 08/18/2011   Procedure: LEFT AND RIGHT HEART CATHETERIZATION WITH CORONARY ANGIOGRAM;  Surgeon: [name], MD;  Location: [facility] CATH LAB;  Service: Cardiovascular;  Laterality: N/A;  . LUMBAR LAMINECTOMY/DECOMPRESSION MICRODISCECTOMY  03/02/2011   Procedure: LUMBAR LAMINECTOMY/DECOMPRESSION MICRODISCECTOMY;  Surgeon: [name], MD;  Location: [facility] ORS;  Service: Orthopedics;  Laterality: N/A;  Decompression Lumbar 4 - Lumbar(X-Ray)  . PACEMAKER INSERTION  06/2011   [employer] PPM implanted by Dr Sallyanne Kuster  . PERMANENT PACEMAKER INSERTION Left 07/08/2011   Procedure: PERMANENT PACEMAKER INSERTION;  Surgeon: [name], MD;  Location: [facility] CATH LAB;  Service: Cardiovascular;  Laterality: Left;    . PROSTATE SURGERY  2/02   prostate cancer  . TONSILECTOMY, ADENOIDECTOMY, BILATERAL MYRINGOTOMY AND TUBES    . TONSILLECTOMY  as child  . TOTAL KNEE ARTHROPLASTY Left 06/04/2012   Procedure: TOTAL KNEE ARTHROPLASTY;  Surgeon: [name], MD;  Location: [facility] ORS;  Service: Orthopedics;  Laterality: Left;  . UVULOPALATOPHARYNGOPLASTY  2013  . vocal cord lesion bx         Home Medications    Prior to Admission medications   Medication Sig Start Date End Date Taking? Authorizing Provider  albuterol (ACCUNEB) 0.63 MG/3ML nebulizer solution Inhale into the lungs.    [provider]  albuterol (PROVENTIL HFA;VENTOLIN HFA) 108 (90 BASE) MCG/ACT inhaler Inhale 1-2 puffs into the lungs every 6 (six) hours as needed for shortness of breath. 02/04/14   Baird Lyons D, MD  amiodarone (PACERONE) 200 MG tablet Take 0.5 tablets (100 mg total) by mouth daily. 05/29/15   Allred, Jeneen Rinks, MD  clonazePAM (KLONOPIN) 0.5 MG tablet TAKE 1/2 TO 2 TABLETS AS NEEDED FOR SLEEP 05/28/15   Janith Lima, MD  Cyanocobalamin (B-12) 100 MCG TABS Take by mouth.    [provider]  folic acid (FOLVITE) 1 MG tablet TAKE ONE TABLET EACH DAY 11/18/15   Janith Lima, MD  levalbuterol Penne Lash) 1.25 MG/3ML nebulizer solution Inhale into the lungs.    [provider]  levothyroxine (SYNTHROID, LEVOTHROID) 125 MCG tablet Take 1 tablet (125 mcg total) by mouth daily. 03/03/16   Janith Lima, MD  metoprolol succinate (TOPROL-XL) 50 MG 24 hr tablet TAKE ONE-HALF TABLET EVERY EVENING 05/09/16   Allred, Jeneen Rinks, MD  mometasone (NASONEX) 50 MCG/ACT nasal spray 1-2 puffs each nostril once or twice daily if needed 01/01/16   Deneise Lever, MD  NONFORMULARY OR COMPOUNDED ITEM Allergy Vaccine 1:10 Given at [facility]    [provider]  pantoprazole (PROTONIX) 40 MG tablet Take 1 tablet (40 mg total) by mouth daily. 02/18/16   Armbruster, Renelda Loma, MD  polyethylene glycol powder  (GLYCOLAX/MIRALAX) powder Take 17 g by mouth daily as needed (constipation).  02/05/13   Lafayette Dragon, MD  promethazine-dextromethorphan (PROMETHAZINE-DM) 6.25-15 MG/5ML syrup Take 5 mLs by mouth 4 (four) times daily as needed for cough. 01/04/16   Janith Lima, MD  rosuvastatin (CRESTOR) 5 MG tablet TAKE ONE TABLET IN THE EVENING 12/04/15   Janith Lima, MD  sertraline (ZOLOFT) 100 MG tablet Take 1 tablet (100 mg total) by mouth daily. 05/31/16   Janith Lima, MD  spironolactone (ALDACTONE) 25 MG tablet TAKE ONE TABLET BY MOUTH ONCE DAILY 05/29/15   Allred, Jeneen Rinks, MD  umeclidinium-vilanterol (ANORO ELLIPTA) 62.5-25 MCG/INH AEPB Inhale 1 puff into the lungs daily. 11/12/15   Janith Lima, MD  warfarin (COUMADIN) 2.5 MG tablet TAKE AS DIRECTED BY COUMADIN CLINIC 04/14/16   Thompson Grayer, MD    Family History Family History  Problem Relation Age of Onset  . Heart attack Father   . Heart attack Brother   . Heart disease Maternal Uncle   . Heart attack Maternal Uncle   . Colon cancer Neg Hx   . Esophageal cancer Neg Hx   . Rectal cancer Neg Hx   . Prostate cancer Neg Hx   . Stomach cancer Neg Hx     Social History Social History  Substance Use Topics  . Smoking status: Former Smoker    Packs/day: 1.00    Years: 4.00    Types: Cigarettes    Quit date: 03/22/1964  . Smokeless tobacco: Never Used     Comment: reports smoked socially  . Alcohol use 12.0 oz/week    20 Shots of liquor per week     Comment: 2 mixed drinks daily, some days     Allergies   Altace [ramipril]; Hydrocodone; and Oxycodone   Review of Systems Review of Systems  Musculoskeletal: Positive for arthralgias.  Skin: Positive for wound.  All other systems reviewed and are negative.    Physical Exam Updated Vital Signs BP (!) 162/87   Pulse 70   Temp 98 F (36.7 C) (Oral)   Resp 18   Ht 6' (1.829 m)   Wt 93.7 kg   SpO2 97%   BMI 28.01 kg/m   Physical Exam  Constitutional: He is oriented  to person, place, and time. He appears well-developed and well-nourished.  HENT:  Head: Normocephalic and atraumatic.  Mouth/Throat: Oropharynx is clear and moist.  Abrasion to crown of scalp without associated hematoma or skull depression  Eyes: Conjunctivae and EOM are normal. Pupils are equal, round, and reactive to light.  Neck: Normal range of motion.  Cardiovascular: Normal rate, regular  rhythm and normal heart sounds.   Pulmonary/Chest: Effort normal and breath sounds normal. No respiratory distress. He has no wheezes.      Right lower posterior rib tenderness, no deformities, lungs clear, no distress  Abdominal: Soft. Bowel sounds are normal. There is no tenderness. There is no rebound.  Musculoskeletal: Normal range of motion.       Cervical back: Normal.       Thoracic back: Normal.       Lumbar back: Normal.  Abrasion to right ulnar wrist; full ROM maintained Moderate to large sized skin tear of left medial elbow/forearm; no active bleeding; no hematoma or bony deformities; full ROM maintained No C/T/L spine tenderness or deformity  Neurological: He is alert and oriented to person, place, and time.  AAOx3, answering questions and following commands appropriately; equal strength UE and LE bilaterally; CN grossly intact; moves all extremities appropriately without ataxia; no focal neuro deficits or facial asymmetry appreciated  Skin: Skin is warm and dry.  Psychiatric: He has a normal mood and affect.  Nursing note and vitals reviewed.    ED Treatments / Results  Labs (all labs ordered are listed, but only abnormal results are displayed) Labs Reviewed  PROTIME-INR - Abnormal; Notable for the following:       Result Value   Prothrombin Time 21.9 (*)    All other components within normal limits    EKG  EKG Interpretation None       Radiology Dg Ribs Unilateral W/chest Right  Result Date: 06/03/2016 CLINICAL DATA:  Fall down 7 steps with right lower rib pain.  EXAM: RIGHT RIBS AND CHEST - 3+ VIEW COMPARISON:  03/30/2016 and 12/31/2015 FINDINGS: Left-sided pacemaker unchanged. Lungs are adequately inflated without consolidation, effusion or pneumothorax. Cardiomediastinal silhouette is within normal. There is no evidence of right rib fracture. Mild degenerate change of the spine. IMPRESSION: No acute findings. Electronically Signed   By: Marin Olp M.D.   On: 06/03/2016 19:12   Ct Head Wo Contrast  Result Date: 06/03/2016 CLINICAL DATA:  Patient fell backwards down the stairs. Head pain. Neck pain. Patient is anticoagulated. EXAM: CT HEAD WITHOUT CONTRAST CT CERVICAL SPINE WITHOUT CONTRAST TECHNIQUE: Multidetector CT imaging of the head and cervical spine was performed following the standard protocol without intravenous contrast. Multiplanar CT image reconstructions of the cervical spine were also generated. COMPARISON:  10/01/2015 FINDINGS: CT HEAD FINDINGS Brain: No evidence of acute infarction, hemorrhage, hydrocephalus, extra-axial collection or mass lesion/mass effect. Atrophy with small vessel disease. Remote LEFT MCA infarct. Vascular: No hyperdense vessel or unexpected calcification. Skull: Normal. Negative for fracture or focal lesion. Sinuses/Orbits: No acute finding. Other:  RIGHT frontal scalp hematoma. CT CERVICAL SPINE FINDINGS Alignment: Normal except for 1-2 mm of degenerative anterolisthesis at C7-T1, facet mediated. Skull base and vertebrae: No acute fracture. No primary bone lesion or focal pathologic process. Soft tissues and spinal canal: No prevertebral fluid or swelling. No visible canal hematoma. Calcification at carotid bifurcations related to atherosclerosis. Disc levels: Advanced disc space narrowing at C5-6 and C6-7. Spontaneous arthrodesis at C3-4. Severe facet arthropathy on the RIGHT at C4-C5. BILATERAL facet arthropathy at C7-T1. Upper chest: Negative. Other: Incompletely visualized pacemaker wires. IMPRESSION: Atrophy and small  vessel disease. RIGHT frontal scalp hematoma. No acute intracranial findings. No cervical spine fracture or traumatic subluxation. Advanced cervical spondylosis. Electronically Signed   By: Staci Righter M.D.   On: 06/03/2016 19:20   Ct Cervical Spine Wo Contrast  Result Date: 06/03/2016 CLINICAL DATA:  Patient fell backwards down the stairs. Head pain. Neck pain. Patient is anticoagulated. EXAM: CT HEAD WITHOUT CONTRAST CT CERVICAL SPINE WITHOUT CONTRAST TECHNIQUE: Multidetector CT imaging of the head and cervical spine was performed following the standard protocol without intravenous contrast. Multiplanar CT image reconstructions of the cervical spine were also generated. COMPARISON:  10/01/2015 FINDINGS: CT HEAD FINDINGS Brain: No evidence of acute infarction, hemorrhage, hydrocephalus, extra-axial collection or mass lesion/mass effect. Atrophy with small vessel disease. Remote LEFT MCA infarct. Vascular: No hyperdense vessel or unexpected calcification. Skull: Normal. Negative for fracture or focal lesion. Sinuses/Orbits: No acute finding. Other:  RIGHT frontal scalp hematoma. CT CERVICAL SPINE FINDINGS Alignment: Normal except for 1-2 mm of degenerative anterolisthesis at C7-T1, facet mediated. Skull base and vertebrae: No acute fracture. No primary bone lesion or focal pathologic process. Soft tissues and spinal canal: No prevertebral fluid or swelling. No visible canal hematoma. Calcification at carotid bifurcations related to atherosclerosis. Disc levels: Advanced disc space narrowing at C5-6 and C6-7. Spontaneous arthrodesis at C3-4. Severe facet arthropathy on the RIGHT at C4-C5. BILATERAL facet arthropathy at C7-T1. Upper chest: Negative. Other: Incompletely visualized pacemaker wires. IMPRESSION: Atrophy and small vessel disease. RIGHT frontal scalp hematoma. No acute intracranial findings. No cervical spine fracture or traumatic subluxation. Advanced cervical spondylosis. Electronically Signed   By:  Staci Righter M.D.   On: 06/03/2016 19:20    Procedures Procedures (including critical care time)  Medications Ordered in ED Medications  traMADol (ULTRAM) tablet 50 mg (50 mg Oral Given 06/03/16 2255)     Initial Impression / Assessment and Plan / ED Course  I have reviewed the triage vital signs and the nursing notes.  Pertinent labs & imaging results that were available during my care of the patient were reviewed by me and considered in my medical decision making (see chart for details).  81 year old male here with mechanical fall on the stairs. Struck his back against the stairs and hit head against the wall. No loss of consciousness. He does have an abrasions to the top of his head, left elbow, right wrist. Also has some right posterior rib pain. He is awake, alert, appropriately oriented. No focal neurologic deficits. No midline spinal tenderness or deformities.  CT scan of head and neck as well as right rib films obtained from triage and are negative. INR is 1.8. Tetanus is up-to-date. Patient's wounds were cleansed and dressed. He remains stable here without additional complaints.  Feels ready to go home.  VSS.  Will have him follow-up with his primary care doctor for any ongoing issues.  Discussed plan with patient and wife, they acknowledged understanding and agreed with plan of care.  Return precautions given for new or worsening symptoms.  Patient seen and evaluated with attending physician, Dr. Rex Kras.  Final Clinical Impressions(s) / ED Diagnoses   Final diagnoses:  Fall (on) (from) other stairs and steps, initial encounter  Multiple abrasions    New Prescriptions New Prescriptions   TRAMADOL (ULTRAM) 50 MG TABLET    Take 1 tablet (50 mg total) by mouth every 6 (six) hours as needed.     Larene Pickett, PA-C 06/03/16 2342    Little, Wenda Overland, MD 06/04/16 [phone]

## 2016-06-03 NOTE — Discharge Instructions (Addendum)
As we discussed your imaging was negative for acute injuries today. Take the prescribed medication as directed.  Use caution, can make you sleepy sometimes. Follow-up with your primary care doctor for any ongoing issues. Return to the ED for new or worsening symptoms.

## 2016-06-03 NOTE — ED Triage Notes (Signed)
The pt tripped and fell down approx 7 steps insode his house he lost his footing  His head struck a wall no loc  He is also c/o rt lateral  Posterior rib pain and an abrasion to his lt elbow area

## 2016-06-03 NOTE — ED Triage Notes (Signed)
The pt takes coumadin  He has a pacemaker

## 2016-06-07 ENCOUNTER — Encounter: Payer: Self-pay | Admitting: Gastroenterology

## 2016-06-07 ENCOUNTER — Ambulatory Visit (INDEPENDENT_AMBULATORY_CARE_PROVIDER_SITE_OTHER): Payer: Medicare Other | Admitting: Gastroenterology

## 2016-06-07 VITALS — BP 134/78 | HR 80 | Ht 72.0 in | Wt 205.0 lb

## 2016-06-07 DIAGNOSIS — Z7901 Long term (current) use of anticoagulants: Secondary | ICD-10-CM | POA: Diagnosis not present

## 2016-06-07 DIAGNOSIS — K5900 Constipation, unspecified: Secondary | ICD-10-CM | POA: Diagnosis not present

## 2016-06-07 DIAGNOSIS — K649 Unspecified hemorrhoids: Secondary | ICD-10-CM | POA: Diagnosis not present

## 2016-06-07 NOTE — Progress Notes (Signed)
HPI :  81 y/o male known to me from clinic visit in 2016, here for a follow up visit. He has a history of A-Fibb with remote TIA on coumadin, remote MI in 1992.   Here for symptoms of rectal bleeding. He reports he has had this periodically for "more than 50 years" to a variety of extents - usually very mild only on the toilet paper , and occurring more frequently and more volume recently.  He reports red blood both on the toilet paper and in the stools, he thinks about 50% of his stools have blood noted. He is having a few bowel movements per week. He endorses some constipation / straining. He reports a significant amount of time on the toilet with straining, wife states up to 1 to 2 hours at a time. He has tried Miralax and Citrucel for his bowels, he thinks Miralax has worked better. He has not been taking anything recently. He thinks he has some decreased appetite, but no nausea or vomiting. Weight is stable, he thinks gaining weight. He reports rare abdominal discomfort, not routinely.   History of prostate cancer, treated with surgery, no radiation therapy.    Of note, he had a recent fall and has bruising to his arms and scalp.  Prior workup for issues in the past: EGD 06/18/14 - bleeding small gastric polyps removed EGD 01/22/14 - bleeding large gastric polyp removed Colonoscopy 1/15 - severe diverticulosis left colon, hemorrhoids Flex sig 12/2013 - severe left sided diverticulosis, hemorrhoids CT abdomen 10/05/14 - normal liver, GB, pancreas. Diverticulosis noted US abdomen 05/30/14 - no gallstones, normal GB, fatty liver, normal spleen     Past Medical History:  Diagnosis Date  . Alcohol abuse, episodic 05/19/2008  . ALLERGIC RHINITIS 04/15/2009  . Allergy    YEAR ROUND,TAKE SHOTS  . Alzheimer's dementia    beginning with sx  . Anxiety   . Arthritis   . ASTHMA 11/16/2006   sinusitis-    hx ?yeast patch/white patch on vocal cord as per [facility] 02/25/11-   . Atrial fibrillation ([place])  11/16/2006   remote CHF related to atrial fib with rapid ventricular response over 25 yrs per office note,  . Cancer [facility]) 2002   prostate cancer  . Chronic systolic CHF (congestive heart failure) (HCC)    echo (10/02/12): EF 58-09%, grade 1 diastolic dysfunction, trivial AI, mild MR, mild RVE  . COLONIC POLYPS, ADENOMATOUS, HX OF 02/14/2008  . CORONARY ARTERY DISEASE 11/16/2006   LHC (07/2011): LAD with luminal irregularities, proximal circumflex 50%, EF 25%  . Depression   . DIVERTICULOSIS, COLON 02/14/2008  . ESOPHAGEAL STRICTURE 01/28/2008  . GERD 02/14/2008  . Headache(784.0)   . HYPERGLYCEMIA 11/16/2006  . HYPERLIPIDEMIA 02/14/2008  . HYPERTENSION 11/16/2006  . ICD (implantable cardiac defibrillator) in place   . INSOMNIA-SLEEP DISORDER-UNSPEC 02/11/2009  . Lesion of vocal cord    bx begine  . Myocardial infarction ([place]) 1993  . NICM (nonischemic cardiomyopathy) ([place])   . OBSTRUCTIVE SLEEP APNEA 11/10/2008  . 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 Pacemaker    St. Jude  . Pneumonia    hx of  . PROSTATE CANCER, HX OF 02/25/2000  . SLEEP APNEA 10/03/2009   LOV Dr [name] 12/12 in EPIC    Moderate per patient- settings ?6/last sleep study years ago  . Sleep apnea   . Stroke ([place])    Tia  . Symptomatic bradycardia, secondary to sinus node dysfunction 07/08/2011   s/p [employer] PPM  by Dr Sallyanne Kuster, upgraded to BiV ICD 12/13 by Dr Rayann Heman (SJM)  . TRANSIENT ISCHEMIC ATTACKS, HX OF 11/16/2006     Past Surgical History:  Procedure Laterality Date  . BACK SURGERY    . BI-VENTRICULAR IMPLANTABLE CARDIOVERTER DEFIBRILLATOR UPGRADE N/A 01/16/2012   Procedure: BI-VENTRICULAR IMPLANTABLE CARDIOVERTER DEFIBRILLATOR UPGRADE;  Surgeon: Thompson Grayer, MD;  Location: [facility] CATH LAB;  Service: Cardiovascular;  Laterality: N/A;  . CARDIAC CATHETERIZATION     several  . CARDIAC DEFIBRILLATOR PLACEMENT  01/16/12   Upgrade to a biventricular SJM ICD by DR Allred  . COLONOSCOPY    .  ESOPHAGOGASTRODUODENOSCOPY     with dilitation  . INSERT / REPLACE / REMOVE PACEMAKER    . KNEE ARTHROSCOPY     2 surgeries right and 1 left  . LEFT AND RIGHT HEART CATHETERIZATION WITH CORONARY ANGIOGRAM N/A 08/18/2011   Procedure: LEFT AND RIGHT HEART CATHETERIZATION WITH CORONARY ANGIOGRAM;  Surgeon: [name], MD;  Location: [place] CATH LAB;  Service: Cardiovascular;  Laterality: N/A;  . LUMBAR LAMINECTOMY/DECOMPRESSION MICRODISCECTOMY  03/02/2011   Procedure: LUMBAR LAMINECTOMY/DECOMPRESSION MICRODISCECTOMY;  Surgeon: [name], MD;  [facility]S;  Service: Orthopedics;  Laterality: N/A;  Decompression Lumbar 4 - Lumbar(X-Ray)  . PACEMAKER INSERTION  06/2011   [employer] PPM implanted by Dr Sallyanne Kuster  . PERMANENT PACEMAKER INSERTION Left 07/08/2011   Procedure: PERMANENT PACEMAKER INSERTION;  Surgeon: [name], MD;  Location: [place] CATH LAB;  Service: Cardiovascular;  Laterality: Left;  . PROSTATE SURGERY  2/02   prostate cancer  . TONSILECTOMY, ADENOIDECTOMY, BILATERAL MYRINGOTOMY AND TUBES    . TONSILLECTOMY  as child  . TOTAL KNEE ARTHROPLASTY Left 06/04/2012   Procedure: TOTAL KNEE ARTHROPLASTY;  Surgeon: [name], MD;  [facility]: [facility] ORS;  Service: Orthopedics;  Laterality: Left;  . UVULOPALATOPHARYNGOPLASTY  2013  . vocal cord lesion bx     Family History  Problem Relation Age of Onset  . Heart attack Father   . Heart attack Brother   . Heart disease Maternal Uncle   . Heart attack Maternal Uncle   . Colon cancer Neg Hx   . Esophageal cancer Neg Hx   . Rectal cancer Neg Hx   . Prostate cancer Neg Hx   . Stomach cancer Neg Hx    Social History  Substance Use Topics  . Smoking status: Former Smoker    Packs/day: 1.00    Years: 4.00    Types: Cigarettes    Quit date: 03/22/1964  . Smokeless tobacco: Never Used     Comment: reports smoked socially  . Alcohol use 12.0 oz/week    20 Shots of liquor per week     Comment: 2 mixed drinks daily, some  days   Current Outpatient Prescriptions  Medication Sig Dispense Refill  . albuterol (ACCUNEB) 0.63 MG/3ML nebulizer solution Inhale into the lungs.    Marland Kitchen albuterol (PROVENTIL HFA;VENTOLIN HFA) 108 (90 BASE) MCG/ACT inhaler Inhale 1-2 puffs into the lungs every 6 (six) hours as needed for shortness of breath. 1 Inhaler 11  . amiodarone (PACERONE) 200 MG tablet Take 0.5 tablets (100 mg total) by mouth daily. 45 tablet 3  . clonazePAM (KLONOPIN) 0.5 MG tablet TAKE 1/2 TO 2 TABLETS AS NEEDED FOR SLEEP 30 tablet 2  . Cyanocobalamin (B-12) 100 MCG TABS Take by mouth.    . folic acid (FOLVITE) 1 MG tablet TAKE ONE TABLET EACH DAY 90 tablet 3  . levalbuterol (XOPENEX) 1.25 MG/3ML nebulizer solution Inhale into the  lungs.    . levothyroxine (SYNTHROID, LEVOTHROID) 125 MCG tablet Take 1 tablet (125 mcg total) by mouth daily. 90 tablet 1  . metoprolol succinate (TOPROL-XL) 50 MG 24 hr tablet TAKE ONE-HALF TABLET EVERY EVENING 15 tablet 11  . mometasone (NASONEX) 50 MCG/ACT nasal spray 1-2 puffs each nostril once or twice daily if needed 17 g 0  . NONFORMULARY OR COMPOUNDED ITEM Allergy Vaccine 1:10 Given at [facility]    . pantoprazole (PROTONIX) 40 MG tablet Take 1 tablet (40 mg total) by mouth daily. 90 tablet 3  . polyethylene glycol powder (GLYCOLAX/MIRALAX) powder Take 17 g by mouth daily as needed (constipation).     . promethazine-dextromethorphan (PROMETHAZINE-DM) 6.25-15 MG/5ML syrup Take 5 mLs by mouth 4 (four) times daily as needed for cough. 118 mL 0  . rosuvastatin (CRESTOR) 5 MG tablet TAKE ONE TABLET IN THE EVENING 30 tablet 11  . sertraline (ZOLOFT) 100 MG tablet Take 1 tablet (100 mg total) by mouth daily. 90 tablet 3  . spironolactone (ALDACTONE) 25 MG tablet TAKE ONE TABLET BY MOUTH ONCE DAILY 30 tablet 11  . traMADol (ULTRAM) 50 MG tablet Take 1 tablet (50 mg total) by mouth every 6 (six) hours as needed. 20 tablet 0  . umeclidinium-vilanterol (ANORO ELLIPTA) 62.5-25 MCG/INH  AEPB Inhale 1 puff into the lungs daily. 90 each 3  . warfarin (COUMADIN) 2.5 MG tablet TAKE AS DIRECTED BY COUMADIN CLINIC 35 tablet 2   No current facility-administered medications for this visit.    Allergies  Allergen Reactions  . Altace [Ramipril] Cough  . Hydrocodone Other (See Comments)    Severe panic attacks  . Oxycodone Other (See Comments)    Severe panic attacks     Review of Systems: All systems reviewed and negative except where noted in HPI.    Lab Results  Component Value Date   WBC 4.8 03/01/2016   HGB 13.4 12/01/2015   HCT 41.0 03/01/2016   MCV 93 03/01/2016   PLT 209 03/01/2016    Lab Results  Component Value Date   CREATININE 1.39 (H) 03/01/2016   BUN 18 03/01/2016   NA 138 03/01/2016   K 4.6 03/01/2016   CL 98 03/01/2016   CO2 25 03/01/2016   Lab Results  Component Value Date   ALT 60 (H) 03/01/2016   AST 81 (H) 03/01/2016   ALKPHOS 95 03/01/2016   BILITOT 0.5 03/01/2016     Physical Exam: BP 134/78   Pulse 80   Ht 6' (1.829 m)   Wt 205 lb (93 kg)   BMI 27.80 kg/m  Constitutional: Pleasant, male in no acute distress. HEENT: Normocephalic and atraumatic. Conjunctivae are normal. No scleral icterus. Neck supple.  Cardiovascular: Normal rate, regular rhythm.  Pulmonary/chest: Effort normal and breath sounds normal. No wheezing, rales or rhonchi. Abdominal: Soft, nondistended, nontender. . There are no masses palpable. No hepatomegaly. DRE / Anoscopy - no fissure, external hemorrhoids, inflamed internal hemorrhoids noted on anoscopy. No mass lesions Extremities: no edema Lymphadenopathy: No cervical adenopathy noted. Neurological: Alert and oriented to person place and time. Skin: Skin is warm and dry. No rashes noted. Psychiatric: Normal mood and affect. Behavior is normal.   ASSESSMENT AND PLAN: 81 year old male history of atrial fibrillation on Coumadin here for evaluation of rectal bleeding in the setting of constipation.  Anoscopy shows evidence of inflamed internal hemorrhoids which I suspect are the very likely etiology of his symptoms. He's had fairly recent colonoscopy and flexible sigmoidoscopy in 2015  without polyps, I think mass lesion or polyp at this time is very unlikely. Recommend more aggressive treatment of his constipation which I think will help. He has not had benefit from fiber supplements in the past, will try MiraLAX twice daily, and titrate up as needed for goal bowel movement once daily to every other day. I advised him to avoid straining if at all possible, minimize time on the toilet. We'll also try using hydrocortisone cream on glycerin suppositories for 2 weeks to reduce inflammation. Normally I would consider hemorrhoid banding in this situation however given his Coumadin use, would hold off on this for now. If he fails conservative therapy and symptoms persist I asked him to contact me. We may consider banding in this situation but would need to hold coumadin for a period of time. Patient and wife were in agreement with the plan.   Of note, his liver enzymes were elevated on last draw, suspect related to alcohol / fatty liver. Would repeat LFTs at his convenience to trend, minimize alcohol use.   [place] Cellar, MD [facility] Pager [phone]

## 2016-06-07 NOTE — Progress Notes (Signed)
Subjective:    Patient ID: Corey [name], male    DOB: 1935-05-27, 81 y.o.   MRN: [id number]  HPI He is here for follow up from the hospital.  He went to the emergency room 06/03/16 afall at home.He was walking up the stairs and when he reached for the hand rail middle of stairs on the right side is correct slipped and he fell backwards. He struck his back on the stairs and struck his head on the wall. There was no loss of consciousness. He was experiencing a headache and right-sided rib pain in the emergency room. He also had soreness in his back. He denied any numbness or weakness of the extremities. He denied confusion, dizziness, nausea, blurred vision, changes in speech, bladder incontinence or difficulty walking. He does take Coumadin regularly. He had an abrasion of the scalp without hematoma or skull depression. He tenderness in the right ribs. His lungs were clear to auscultation. In abrasion of the right wrist, skin tear of the left elbow/forearm and there was no neurological deficits. He received tramadol in the emergency room. CT of the head and neck and x-ray of the right ribs while negative for acute injury. INR was 1.8. Tetanus was up-to-date and he did not require a blister. His wounds were cleaned and dressed. He was discharged home with a prescription of tramadol.  He is taking the tramadol as needed and it helps some.  He denies headaches/dizziness/lightheadedness/blurry vision and nausea.  He has had a couple of episodes of feeling foggy.  He still has some soreness in his posterior ribs - right > left.  His lower back is sore.  He has mild abrasions on his right lower arm.  He has two larger abrasions on his left forearm that are bandaged.      Medications and allergies reviewed with patient and updated if appropriate.  Patient Active Problem List   Diagnosis Date Noted  . Depression with anxiety 05/31/2016  . 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  Bronchiectasis without acute exacerbation ([place]) 12/31/2015  . Chronic alcoholic hepatitis 70/26/3785  . Obstructive chronic bronchitis without exacerbation ([place]) 11/12/2015  . Spinal stenosis in cervical region 10/02/2015  . Numbness and tingling 09/30/2015  . Gallbladder sludge 03/02/2015  . Alcohol dependence with intoxication with complication ([place]) 88/50/2774  . Other iron deficiency anemias 11/13/2013  . B12 deficiency anemia 11/13/2013  . Encounter for therapeutic drug monitoring 02/25/2013  . Long term (current) use of anticoagulants 05/12/2012  . [employer] 11/01/2011  . Chronic systolic heart failure ([place]) 08/28/2011  . Vocal cord nodule 05/19/2011  . Lumbar spinal stenosis 03/03/2011  . Seasonal and perennial allergic rhinitis 04/15/2009  . Insomnia 02/11/2009  . Obstructive sleep apnea 11/10/2008  . Hyperlipidemia with target LDL less than 100 02/14/2008  . GERD 02/14/2008  . ESOPHAGEAL STRICTURE 01/28/2008  . Essential hypertension 11/16/2006  . Coronary atherosclerosis 11/16/2006  . Atrial fibrillation ([place]) 11/16/2006  . Hyperglycemia 11/16/2006    Current Outpatient Prescriptions on File Prior to Visit  Medication Sig Dispense Refill  . albuterol (ACCUNEB) 0.63 MG/3ML nebulizer solution Inhale into the lungs.    Marland Kitchen albuterol (PROVENTIL HFA;VENTOLIN HFA) 108 (90 BASE) MCG/ACT inhaler Inhale 1-2 puffs into the lungs every 6 (six) hours as needed for shortness of breath. 1 Inhaler 11  . amiodarone (PACERONE) 200 MG tablet Take 0.5 tablets (100 mg total) by mouth daily. 45 tablet 3  . clonazePAM (KLONOPIN) 0.5 MG tablet TAKE 1/2 TO  2 TABLETS AS NEEDED FOR SLEEP 30 tablet 2  . Cyanocobalamin (B-12) 100 MCG TABS Take by mouth.    . folic acid (FOLVITE) 1 MG tablet TAKE ONE TABLET EACH DAY 90 tablet 3  . levalbuterol (XOPENEX) 1.25 MG/3ML nebulizer solution Inhale into the lungs.    Marland Kitchen levothyroxine (SYNTHROID, LEVOTHROID) 125 MCG tablet Take 1 tablet (125  mcg total) by mouth daily. 90 tablet 1  . metoprolol succinate (TOPROL-XL) 50 MG 24 hr tablet TAKE ONE-HALF TABLET EVERY EVENING 15 tablet 11  . mometasone (NASONEX) 50 MCG/ACT nasal spray 1-2 puffs each nostril once or twice daily if needed 17 g 0  . NONFORMULARY OR COMPOUNDED ITEM Allergy Vaccine 1:10 Given at [facility]    . pantoprazole (PROTONIX) 40 MG tablet Take 1 tablet (40 mg total) by mouth daily. 90 tablet 3  . polyethylene glycol powder (GLYCOLAX/MIRALAX) powder Take 17 g by mouth daily as needed (constipation).     . promethazine-dextromethorphan (PROMETHAZINE-DM) 6.25-15 MG/5ML syrup Take 5 mLs by mouth 4 (four) times daily as needed for cough. 118 mL 0  . rosuvastatin (CRESTOR) 5 MG tablet TAKE ONE TABLET IN THE EVENING 30 tablet 11  . sertraline (ZOLOFT) 100 MG tablet Take 1 tablet (100 mg total) by mouth daily. 90 tablet 3  . spironolactone (ALDACTONE) 25 MG tablet TAKE ONE TABLET BY MOUTH ONCE DAILY 30 tablet 11  . traMADol (ULTRAM) 50 MG tablet Take 1 tablet (50 mg total) by mouth every 6 (six) hours as needed. 20 tablet 0  . umeclidinium-vilanterol (ANORO ELLIPTA) 62.5-25 MCG/INH AEPB Inhale 1 puff into the lungs daily. 90 each 3  . warfarin (COUMADIN) 2.5 MG tablet TAKE AS DIRECTED BY COUMADIN CLINIC 35 tablet 2   No current facility-administered medications on file prior to visit.     Past Medical History:  Diagnosis Date  . Alcohol abuse, episodic 05/19/2008  . ALLERGIC RHINITIS 04/15/2009  . Allergy    YEAR ROUND,TAKE SHOTS  . Alzheimer's dementia    beginning with sx  . Anxiety   . Arthritis   . ASTHMA 11/16/2006   sinusitis-    hx ?yeast patch/white patch on vocal cord as per [facility] 02/25/11-   . Atrial fibrillation ([place]) 11/16/2006   remote CHF related to atrial fib with rapid ventricular response over 25 yrs per office note,  . Cancer [facility]) 2002   prostate cancer  . Chronic systolic CHF (congestive heart failure) (HCC)    echo (10/02/12): EF 35-40%,  grade 1 diastolic dysfunction, trivial AI, mild MR, mild RVE  . COLONIC POLYPS, ADENOMATOUS, HX OF 02/14/2008  . CORONARY ARTERY DISEASE 11/16/2006   LHC (07/2011): LAD with luminal irregularities, proximal circumflex 50%, EF 25%  . Depression   . DIVERTICULOSIS, COLON 02/14/2008  . ESOPHAGEAL STRICTURE 01/28/2008  . GERD 02/14/2008  . Headache(784.0)   . HYPERGLYCEMIA 11/16/2006  . HYPERLIPIDEMIA 02/14/2008  . HYPERTENSION 11/16/2006  . ICD (implantable cardiac defibrillator) in place   . INSOMNIA-SLEEP DISORDER-UNSPEC 02/11/2009  . Lesion of vocal cord    bx begine  . Myocardial infarction ([place]) 1993  . NICM (nonischemic cardiomyopathy) ([place])   . OBSTRUCTIVE SLEEP APNEA 11/10/2008  . 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 Pacemaker    St. Jude  . Pneumonia    hx of  . PROSTATE CANCER, HX OF 02/25/2000  . SLEEP APNEA 10/03/2009   LOV Dr [name] 12/12 in EPIC    Moderate per patient- settings ?6/last sleep study years ago  . Sleep  apnea   . Stroke ([place])    Tia  . Symptomatic bradycardia, secondary to sinus node dysfunction 07/08/2011   s/p [facility] Scientific PPM by Dr Sallyanne Kuster, upgraded to [place] ICD 12/13 by Dr Rayann Heman (SJM)  . TRANSIENT ISCHEMIC ATTACKS, HX OF 11/16/2006    Past Surgical History:  Procedure Laterality Date  . BACK SURGERY    . BI-VENTRICULAR IMPLANTABLE CARDIOVERTER DEFIBRILLATOR UPGRADE N/A 01/16/2012   Procedure: BI-VENTRICULAR IMPLANTABLE CARDIOVERTER DEFIBRILLATOR UPGRADE;  Surgeon: Thompson Grayer, MD;  Location: [facility] CATH LAB;  Service: Cardiovascular;  Laterality: N/A;  . CARDIAC CATHETERIZATION     several  . CARDIAC DEFIBRILLATOR PLACEMENT  01/16/12   Upgrade to a biventricular SJM ICD by DR Allred  . COLONOSCOPY    . ESOPHAGOGASTRODUODENOSCOPY     with dilitation  . INSERT / REPLACE / REMOVE PACEMAKER    . KNEE ARTHROSCOPY     2 surgeries right and 1 left  . LEFT AND RIGHT HEART CATHETERIZATION WITH CORONARY ANGIOGRAM N/A 08/18/2011   Procedure: LEFT AND  RIGHT HEART CATHETERIZATION WITH CORONARY ANGIOGRAM;  Surgeon: [name], MD;  Location: [place] CATH LAB;  Service: Cardiovascular;  Laterality: N/A;  . LUMBAR LAMINECTOMY/DECOMPRESSION MICRODISCECTOMY  03/02/2011   Procedure: LUMBAR LAMINECTOMY/DECOMPRESSION MICRODISCECTOMY;  Surgeon: [name], MD;  [facility];  Service: Orthopedics;  Laterality: N/A;  Decompression Lumbar 4 - Lumbar(X-Ray)  . PACEMAKER INSERTION  06/2011   [employer] PPM implanted by Dr Sallyanne Kuster  . PERMANENT PACEMAKER INSERTION Left 07/08/2011   Procedure: PERMANENT PACEMAKER INSERTION;  Surgeon: [name], MD;  Location: [place] CATH LAB;  Service: Cardiovascular;  Laterality: Left;  . PROSTATE SURGERY  2/02   prostate cancer  . TONSILECTOMY, ADENOIDECTOMY, BILATERAL MYRINGOTOMY AND TUBES    . TONSILLECTOMY  as child  . TOTAL KNEE ARTHROPLASTY Left 06/04/2012   Procedure: TOTAL KNEE ARTHROPLASTY;  Surgeon: [name], MD;  Location: [facility]S;  Service: Orthopedics;  Laterality: Left;  . UVULOPALATOPHARYNGOPLASTY  2013  . vocal cord lesion bx      Social History   Social History  . Marital status: Married    Spouse name: N/A  . Number of children: N/A  . Years of education: N/A   Social History Main Topics  . Smoking status: Former Smoker    Packs/day: 1.00    Years: 4.00    Types: Cigarettes    Quit date: 03/22/1964  . Smokeless tobacco: Never Used     Comment: reports smoked socially  . Alcohol use 12.0 oz/week    20 Shots of liquor per week     Comment: 2 mixed drinks daily, some days  . Drug use: No  . Sexual activity: Yes    Partners: Female   Other Topics Concern  . Not on file   Social History Narrative  . No narrative on file    Family History  Problem Relation Age of Onset  . Heart attack Father   . Heart attack Brother   . Heart disease Maternal Uncle   . Heart attack Maternal Uncle   . Colon cancer Neg Hx   . Esophageal cancer Neg Hx   . Rectal cancer Neg Hx   .  Prostate cancer Neg Hx   . Stomach cancer Neg Hx     Review of Systems  Constitutional: Negative for chills and fever.  Eyes: Negative for visual disturbance.  Respiratory: Negative for cough, shortness of breath and wheezing.   Cardiovascular: Positive for chest pain (ribs; no central  chest pain). Negative for palpitations and leg swelling.  Gastrointestinal: Negative for nausea.  Neurological: Negative for dizziness, light-headedness and headaches.  Psychiatric/Behavioral: Negative for confusion. The patient is not nervous/anxious.        Feels like he has been foggy a couple of time       Objective:   Vitals:   06/08/16 1011  BP: 132/78  Pulse: 69  Resp: 16  Temp: 98.1 F (36.7 C)   Filed Weights   06/08/16 1011  Weight: 205 lb (93 kg)   Body mass index is 27.8 kg/m.  Wt Readings from Last 3 Encounters:  06/08/16 205 lb (93 kg)  06/07/16 205 lb (93 kg)  06/03/16 206 lb 8 oz (93.7 kg)     Physical Exam  Constitutional: He is oriented to person, place, and time. He appears well-developed and well-nourished. No distress.  HENT:  Abrasion on top of head - no open wounds  Eyes: Conjunctivae are normal.  Neck: Neck supple. No tracheal deviation present. No thyromegaly present.  Cardiovascular: Normal rate, regular rhythm and normal heart sounds.   No murmur heard. Pulmonary/Chest: Effort normal and breath sounds normal. No respiratory distress. He has no wheezes. He has no rales.  Musculoskeletal: He exhibits no edema.  Lymphadenopathy:    He has no cervical adenopathy.  Neurological: He is alert and oriented to person, place, and time. No cranial nerve deficit.  Skin: He is not diaphoretic.  Mild abrasions on top of head; a couple of abrasions on right lower arm, larger abrasion left forearm with several band-aids.    Psychiatric: He has a normal mood and affect. His behavior is normal. Judgment and thought content normal.     Right ribs and chest x-ray,  06/03/16:IMPRESSION: No acute findings.  CT head without contrast, CT cervical spine without contrast, 06/03/16:IMPRESSION: Atrophy and small vessel disease. RIGHT frontal scalp hematoma. No acute intracranial findings.  No cervical spine fracture or traumatic subluxation.  Advanced cervical spondylosis.     Assessment & Plan:   See Problem List for Assessment and Plan of chronic medical problems.

## 2016-06-07 NOTE — Patient Instructions (Signed)
If you are age 79 or older, your body mass index should be between 23-30. Your Body mass index is 27.8 kg/m. If this is out of the aforementioned range listed, please consider follow up with your Primary Care Provider.  If you are age 51 or younger, your body mass index should be between 19-25. Your Body mass index is 27.8 kg/m. If this is out of the aformentioned range listed, please consider follow up with your Primary Care Provider.   Please use Miralax BID increase if needed.  Use Hydrocortisone Cream/Glycerin Suppository at night.  Follow up as needed with Dr. Havery Moros.  Thank you.

## 2016-06-08 ENCOUNTER — Ambulatory Visit (INDEPENDENT_AMBULATORY_CARE_PROVIDER_SITE_OTHER): Payer: Medicare Other | Admitting: Internal Medicine

## 2016-06-08 ENCOUNTER — Encounter: Payer: Self-pay | Admitting: Internal Medicine

## 2016-06-08 DIAGNOSIS — M5134 Other intervertebral disc degeneration, thoracic region: Secondary | ICD-10-CM | POA: Insufficient documentation

## 2016-06-08 DIAGNOSIS — S40812D Abrasion of left upper arm, subsequent encounter: Secondary | ICD-10-CM

## 2016-06-08 DIAGNOSIS — S40811D Abrasion of right upper arm, subsequent encounter: Secondary | ICD-10-CM

## 2016-06-08 DIAGNOSIS — S0990XA Unspecified injury of head, initial encounter: Secondary | ICD-10-CM | POA: Insufficient documentation

## 2016-06-08 DIAGNOSIS — M546 Pain in thoracic spine: Secondary | ICD-10-CM

## 2016-06-08 DIAGNOSIS — S40811A Abrasion of right upper arm, initial encounter: Secondary | ICD-10-CM | POA: Insufficient documentation

## 2016-06-08 DIAGNOSIS — S0990XD Unspecified injury of head, subsequent encounter: Secondary | ICD-10-CM | POA: Diagnosis not present

## 2016-06-08 DIAGNOSIS — S40812A Abrasion of left upper arm, initial encounter: Secondary | ICD-10-CM | POA: Insufficient documentation

## 2016-06-08 MED ORDER — TRAMADOL HCL 50 MG PO TABS: 50.0000 mg | ORAL_TABLET | Freq: Four times a day (QID) | ORAL | 0 refills | Status: DC | PRN

## 2016-06-08 NOTE — Assessment & Plan Note (Signed)
Had some mild fogginess x 2, no other concerning symptoms including headache, blurry vision, nausea, lightheadedness/dizziness Discussed he may have a mild concussion and to limit activities - if his fogginess increases or develops any other symptoms he should call

## 2016-06-08 NOTE — Assessment & Plan Note (Addendum)
Related to falling down stairs Pain in lower back, b/l posterior ribs R>L xrays of ribs negative for fx Tramadol as needed, tylenol as needed - will give him another prescription for tramadol - will only use as needed

## 2016-06-08 NOTE — Assessment & Plan Note (Signed)
No evidence of infection Local wound care Monitor for infection Td up to date

## 2016-06-08 NOTE — Patient Instructions (Signed)
Continue local wound care on your abrasions.  Monitor for infection.  Call with any questions.   Call if you experience any headaches, lightheadedness/dizziness, nausea, blurry vision or recurrent fogginess.  These can be signs of an infection.  Continue to take the tramadol as needed.  You can take tylenol as well - take up to 3000 mg of tylenol in a 24 hour period.

## 2016-06-09 ENCOUNTER — Other Ambulatory Visit (INDEPENDENT_AMBULATORY_CARE_PROVIDER_SITE_OTHER): Payer: Medicare Other

## 2016-06-09 ENCOUNTER — Other Ambulatory Visit: Payer: Self-pay

## 2016-06-09 ENCOUNTER — Telehealth: Payer: Self-pay

## 2016-06-09 DIAGNOSIS — R945 Abnormal results of liver function studies: Principal | ICD-10-CM

## 2016-06-09 DIAGNOSIS — R7989 Other specified abnormal findings of blood chemistry: Secondary | ICD-10-CM

## 2016-06-09 LAB — HEPATIC FUNCTION PANEL
ALBUMIN: 4 g/dL (ref 3.5–5.2)
ALT: 43 U/L (ref 0–53)
AST: 64 U/L — ABNORMAL HIGH (ref 0–37)
Alkaline Phosphatase: 89 U/L (ref 39–117)
Bilirubin, Direct: 0.1 mg/dL (ref 0.0–0.3)
TOTAL PROTEIN: 7.2 g/dL (ref 6.0–8.3)
Total Bilirubin: 0.6 mg/dL (ref 0.2–1.2)

## 2016-06-09 NOTE — Progress Notes (Unsigned)
LFT

## 2016-06-09 NOTE — Telephone Encounter (Signed)
Left message informing pt and to come at his convenience to have labs drawn.

## 2016-06-09 NOTE — Telephone Encounter (Signed)
-----   Message from Manus Gunning, MD sent at 06/07/2016  4:35 PM EDT ----- Regarding: follow up labs Corey [name] I forgot to mention to this patient I wanted to repeat Corey [name] LFTs given they were elevated on last draw and to trend. Can you ask him to come to the lab at Corey [name] convenience to check LFTs and place the order? Thanks

## 2016-06-13 ENCOUNTER — Other Ambulatory Visit: Payer: Self-pay

## 2016-06-13 ENCOUNTER — Other Ambulatory Visit: Payer: Medicare Other

## 2016-06-13 ENCOUNTER — Other Ambulatory Visit: Payer: Self-pay | Admitting: Internal Medicine

## 2016-06-13 DIAGNOSIS — R7989 Other specified abnormal findings of blood chemistry: Secondary | ICD-10-CM

## 2016-06-13 DIAGNOSIS — R945 Abnormal results of liver function studies: Principal | ICD-10-CM

## 2016-06-14 ENCOUNTER — Other Ambulatory Visit: Payer: Self-pay

## 2016-06-14 ENCOUNTER — Encounter: Payer: Self-pay | Admitting: Internal Medicine

## 2016-06-14 DIAGNOSIS — R7989 Other specified abnormal findings of blood chemistry: Secondary | ICD-10-CM

## 2016-06-14 DIAGNOSIS — R945 Abnormal results of liver function studies: Principal | ICD-10-CM

## 2016-06-14 LAB — HEPATITIS A ANTIBODY, TOTAL: Hep A Total Ab: BORDERLINE — AB

## 2016-06-14 LAB — HEPATITIS B SURFACE ANTIGEN: HEP B S AG: NEGATIVE

## 2016-06-14 LAB — HEPATITIS B SURFACE ANTIBODY,QUALITATIVE: Hep B S Ab: NEGATIVE

## 2016-06-15 DIAGNOSIS — F4323 Adjustment disorder with mixed anxiety and depressed mood: Secondary | ICD-10-CM | POA: Diagnosis not present

## 2016-06-17 ENCOUNTER — Telehealth: Payer: Self-pay | Admitting: Gastroenterology

## 2016-06-17 ENCOUNTER — Other Ambulatory Visit: Payer: Self-pay | Admitting: Internal Medicine

## 2016-06-17 ENCOUNTER — Ambulatory Visit (HOSPITAL_COMMUNITY)
Admission: RE | Admit: 2016-06-17 | Discharge: 2016-06-17 | Disposition: A | Discharge status: Home / Self Care | Payer: Medicare Other | Source: Ambulatory Visit | Attending: Gastroenterology | Admitting: Gastroenterology

## 2016-06-17 DIAGNOSIS — K760 Fatty (change of) liver, not elsewhere classified: Secondary | ICD-10-CM | POA: Diagnosis not present

## 2016-06-17 DIAGNOSIS — R945 Abnormal results of liver function studies: Secondary | ICD-10-CM | POA: Diagnosis not present

## 2016-06-17 DIAGNOSIS — R7989 Other specified abnormal findings of blood chemistry: Secondary | ICD-10-CM | POA: Insufficient documentation

## 2016-06-21 NOTE — Telephone Encounter (Signed)
Faxed script back to [facility].../lmb 

## 2016-06-21 NOTE — Telephone Encounter (Signed)
Pt is calling about his ultrasound results

## 2016-06-21 NOTE — Telephone Encounter (Signed)
No answer, left message for patient to call back.

## 2016-06-22 ENCOUNTER — Other Ambulatory Visit: Payer: Self-pay

## 2016-06-22 ENCOUNTER — Ambulatory Visit: Payer: Medicare Other | Admitting: Gastroenterology

## 2016-06-22 DIAGNOSIS — K739 Chronic hepatitis, unspecified: Secondary | ICD-10-CM

## 2016-06-23 ENCOUNTER — Telehealth: Payer: Self-pay | Admitting: Gastroenterology

## 2016-06-23 NOTE — Telephone Encounter (Signed)
Patient advised of lab results, let him know to look for a letter with all the results and recommendations. Told him that we would contact him in 3 months to have the lab work repeated.

## 2016-06-29 ENCOUNTER — Other Ambulatory Visit: Payer: Self-pay | Admitting: Internal Medicine

## 2016-07-01 ENCOUNTER — Telehealth: Payer: Self-pay

## 2016-07-01 NOTE — Telephone Encounter (Signed)
Attempted ICM call to cell and home number.  Left messages to send ICM remote transmission and provided number for call back if needs assistance.

## 2016-07-04 ENCOUNTER — Ambulatory Visit: Payer: Medicare Other | Admitting: Nurse Practitioner

## 2016-07-04 ENCOUNTER — Telehealth: Payer: Self-pay | Admitting: Cardiology

## 2016-07-04 NOTE — Progress Notes (Deleted)
CARDIOLOGY OFFICE NOTE  Date:  07/04/2016    Corey [name] Date of Birth: [birth year] Medical Record #[id number]  PCP:  Janith Lima, MD  Cardiologist:  [facility] Allred   No chief complaint on file.   History of Present Illness: Corey [name] is a 81 y.o. male who presents today for a follow up visit. Seen for Dr. Rayann Heman - former patient of Dr. Lowella Fairy.   He has known CAD with non obstructive CAD by cath, NICM, chronic systolic HF, alcohol abuse, Alzheimer's, prostate cancer, HTN, HLD, atrial fib, chronic anticoagulation, underlying PPM/CRT-D, and OSA. Other issues as noted below.   I saw him back last November of 2017 - multiple complaints. More fatigued.  Gaining weight. Had not had his coumadin checked since the prior August. Got his echo updated - surprisingly his EF had improved. Last visit with me was back in February - again - had not had his coumadin checked. Excess alcohol.   Comes in today. Here alone.   Past Medical History:  Diagnosis Date  . Alcohol abuse, episodic 05/19/2008  . ALLERGIC RHINITIS 04/15/2009  . Allergy    YEAR ROUND,TAKE SHOTS  . Alzheimer's dementia    beginning with sx  . Anxiety   . Arthritis   . ASTHMA 11/16/2006   sinusitis-    hx ?yeast patch/white patch on vocal cord as per [facility] 02/25/11-   . Atrial fibrillation ([place]) 11/16/2006   remote CHF related to atrial fib with rapid ventricular response over 25 yrs per office note,  . Cancer [facility]) 2002   prostate cancer  . Chronic systolic CHF (congestive heart failure) (HCC)    echo (10/02/12): EF 17-51%, grade 1 diastolic dysfunction, trivial AI, mild MR, mild RVE  . COLONIC POLYPS, ADENOMATOUS, HX OF 02/14/2008  . CORONARY ARTERY DISEASE 11/16/2006   LHC (07/2011): LAD with luminal irregularities, proximal circumflex 50%, EF 25%  . Depression   . DIVERTICULOSIS, COLON 02/14/2008  . ESOPHAGEAL STRICTURE 01/28/2008  . GERD 02/14/2008  . Headache(784.0)   . HYPERGLYCEMIA  11/16/2006  . HYPERLIPIDEMIA 02/14/2008  . HYPERTENSION 11/16/2006  . ICD (implantable cardiac defibrillator) in place   . INSOMNIA-SLEEP DISORDER-UNSPEC 02/11/2009  . Lesion of vocal cord    bx begine  . Myocardial infarction ([place]) 1993  . NICM (nonischemic cardiomyopathy) ([place])   . OBSTRUCTIVE SLEEP APNEA 11/10/2008  . 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 Pacemaker    St. Jude  . Pneumonia    hx of  . PROSTATE CANCER, HX OF 02/25/2000  . SLEEP APNEA 10/03/2009   LOV Dr [name] 12/12 in EPIC    Moderate per patient- settings ?6/last sleep study years ago  . Sleep apnea   . Stroke ([place])    Tia  . Symptomatic bradycardia, secondary to sinus node dysfunction 07/08/2011   s/p [facility] Scientific PPM by Dr Sallyanne Kuster, upgraded to [place] ICD 12/13 by Dr Rayann Heman (SJM)  . TRANSIENT ISCHEMIC ATTACKS, HX OF 11/16/2006    Past Surgical History:  Procedure Laterality Date  . BACK SURGERY    . BI-VENTRICULAR IMPLANTABLE CARDIOVERTER DEFIBRILLATOR UPGRADE N/A 01/16/2012   Procedure: BI-VENTRICULAR IMPLANTABLE CARDIOVERTER DEFIBRILLATOR UPGRADE;  Surgeon: Thompson Grayer, MD;  Location[facility] C[facility] LAB;  Service: Cardiovascular;  Laterality: N/A;  . CARDIAC CATHETERIZATION     several  . CARDIAC DEFIBRILLATOR PLACEMENT  01/16/12   Upgrade to a biventricular SJM ICD by DR Allred  . COLONOSCOPY    . ESOPHAGOGASTRODUODENOSCOPY     with  dilitation  . INSERT / REPLACE / REMOVE PACEMAKER    . KNEE ARTHROSCOPY     2 surgeries right and 1 left  . LEFT AND RIGHT HEART CATHETERIZATION WITH CORONARY ANGIOGRAM N/A 08/18/2011   Procedure: LEFT AND RIGHT HEART CATHETERIZATION WITH CORONARY ANGIOGRAM;  Surgeon: [name], MD;  Location: [place] CATH LAB;  Service: Cardiovascular;  Laterality: N/A;  . LUMBAR LAMINECTOMY/DECOMPRESSION MICRODISCECTOMY  03/02/2011   Procedure: LUMBAR LAMINECTOMY/DECOMPRESSION MICRODISCECTOMY;  Surgeon: [name], MD;  Location: [facility] ORS;  Service: Orthopedics;  Laterality: N/A;   Decompression Lumbar 4 - Lumbar(X-Ray)  . PACEMAKER INSERTION  06/2011   [employer] PPM implanted by Dr Sallyanne Kuster  . PERMANENT PACEMAKER INSERTION Left 07/08/2011   Procedure: PERMANENT PACEMAKER INSERTION;  Surgeon: [name], MD;  Location: [place] CATH LAB;  Service: Cardiovascular;  Laterality: Left;  . PROSTATE SURGERY  2/02   prostate cancer  . TONSILECTOMY, ADENOIDECTOMY, BILATERAL MYRINGOTOMY AND TUBES    . TONSILLECTOMY  as child  . TOTAL KNEE ARTHROPLASTY Left 06/04/2012   Procedure: TOTAL KNEE ARTHROPLASTY;  Surgeon: [name], MD;  Location: [facility] ORS;  Service: Orthopedics;  Laterality: Left;  . UVULOPALATOPHARYNGOPLASTY  2013  . vocal cord lesion bx       Medications: Current Outpatient Prescriptions  Medication Sig Dispense Refill  . albuterol (ACCUNEB) 0.63 MG/3ML nebulizer solution Inhale into the lungs.    Marland Kitchen albuterol (PROVENTIL HFA;VENTOLIN HFA) 108 (90 BASE) MCG/ACT inhaler Inhale 1-2 puffs into the lungs every 6 (six) hours as needed for shortness of breath. 1 Inhaler 11  . amiodarone (PACERONE) 200 MG tablet Take 0.5 tablets (100 mg total) by mouth daily. 45 tablet 2  . clonazePAM (KLONOPIN) 0.5 MG tablet TAKE 1/2 TO 2 TABLETS AS NEEDED FOR SLEEP 30 tablet 2  . Cyanocobalamin (B-12) 100 MCG TABS Take by mouth.    . folic acid (FOLVITE) 1 MG tablet TAKE ONE TABLET EACH DAY 90 tablet 3  . levalbuterol (XOPENEX) 1.25 MG/3ML nebulizer solution Inhale into the lungs.    Marland Kitchen levothyroxine (SYNTHROID, LEVOTHROID) 125 MCG tablet Take 1 tablet (125 mcg total) by mouth daily. 90 tablet 1  . metoprolol succinate (TOPROL-XL) 50 MG 24 hr tablet TAKE ONE-HALF TABLET EVERY EVENING 15 tablet 11  . mometasone (NASONEX) 50 MCG/ACT nasal spray 1-2 puffs each nostril once or twice daily if needed 17 g 0  . NONFORMULARY OR COMPOUNDED ITEM Allergy Vaccine 1:10 Given at [facility]    . pantoprazole (PROTONIX) 40 MG tablet Take 1 tablet (40 mg total) by mouth daily. 90 tablet  3  . polyethylene glycol powder (GLYCOLAX/MIRALAX) powder Take 17 g by mouth daily as needed (constipation).     . promethazine-dextromethorphan (PROMETHAZINE-DM) 6.25-15 MG/5ML syrup Take 5 mLs by mouth 4 (four) times daily as needed for cough. 118 mL 0  . rosuvastatin (CRESTOR) 5 MG tablet TAKE ONE TABLET IN THE EVENING 30 tablet 11  . sertraline (ZOLOFT) 100 MG tablet Take 1 tablet (100 mg total) by mouth daily. 90 tablet 3  . spironolactone (ALDACTONE) 25 MG tablet TAKE ONE TABLET BY MOUTH ONCE DAILY 30 tablet 8  . traMADol (ULTRAM) 50 MG tablet TAKE ONE TABLET EVERY 6 HOURS AS NEEDED 20 tablet 1  . umeclidinium-vilanterol (ANORO ELLIPTA) 62.5-25 MCG/INH AEPB Inhale 1 puff into the lungs daily. 90 each 3  . warfarin (COUMADIN) 2.5 MG tablet TAKE AS DIRECTED BY COUMADIN CLINIC 35 tablet 3   No current facility-administered medications for this visit.  Allergies: Allergies  Allergen Reactions  . Altace [Ramipril] Cough  . Hydrocodone Other (See Comments)    Severe panic attacks  . Oxycodone Other (See Comments)    Severe panic attacks    Social History: The patient  reports that he quit smoking about 52 years ago. His smoking use included Cigarettes. He has a 4.00 pack-year smoking history. He has never used smokeless tobacco. He reports that he drinks about 12.0 oz of alcohol per week . He reports that he does not use drugs.   Family History: The patient's family history includes Heart attack in his brother, father, and maternal uncle; Heart disease in his maternal uncle.   Review of Systems: Please see the history of present illness.   Otherwise, the review of systems is positive for .   All other systems are reviewed and negative.   Physical Exam: VS:  There were no vitals taken for this visit. Marland Kitchen  BMI There is no height or weight on file to calculate BMI.  Wt Readings from Last 3 Encounters:  06/08/16 205 lb (93 kg)  06/07/16 205 lb (93 kg)  06/03/16 206 lb 8 oz (93.7  kg)    General: Pleasant. Well developed, well nourished and in no acute distress.   HEENT: Normal.  Neck: Supple, no JVD, carotid bruits, or masses noted.  Cardiac: Regular rate and rhythm. No murmurs, rubs, or gallops. No edema.  Respiratory:  Lungs are clear to auscultation bilaterally with normal work of breathing.  GI: Soft and nontender.  MS: No deformity or atrophy. Gait and ROM intact.  Skin: Warm and dry. Color is normal.  Neuro:  Strength and sensation are intact and no gross focal deficits noted.  Psych: Alert, appropriate and with normal affect.   LABORATORY DATA:  EKG:  EKG is not ordered today.  Lab Results  Component Value Date   WBC 4.8 03/01/2016   HGB 13.6 03/01/2016   HCT 41.0 03/01/2016   PLT 209 03/01/2016   GLUCOSE 123 (H) 03/01/2016   CHOL 183 12/01/2015   TRIG 184 (H) 12/01/2015   HDL 42 12/01/2015   LDLCALC 104 (H) 12/01/2015   ALT 43 06/09/2016   AST 64 (H) 06/09/2016   NA 138 03/01/2016   K 4.6 03/01/2016   CL 98 03/01/2016   CREATININE 1.39 (H) 03/01/2016   BUN 18 03/01/2016   CO2 25 03/01/2016   TSH 58.670 (H) 03/01/2016   PSA <0.015 05/26/2016   INR 1.89 06/03/2016   HGBA1C 5.5 11/18/2014   No components found for: HEMOGLOBIN   BNP (last 3 results)  Recent Labs  12/01/15 0922  BNP 5.5    ProBNP (last 3 results) No results for input(s): PROBNP in the last 8760 hours.   Other Studies Reviewed Today:   Assessment/Plan: Angiographic Findings from 07/2011: 1. The left main coronary arteryis free of significant atherosclerosis and trifurcates into the left anterior descending artery , a large ramus intermedius artery and the dominant left circumflex coronary artery.  2. The left anterior descending arteryis a large vessel that reaches the apex and generates two medium size diagonal branches. There is evidence of extensive luminal irregularities and mild proximal calcification, but no hemodynamically meaningful stenoses are  seen. The ramus intermedius is large and free of significant obstruction. 3. The left circumflex coronary arteryis a large-size vessel dominant vessel that generates three medium size oblique marginal arteries and the posterior descending artery. There is evidence of extensive luminal irregularities and mild proximal calcification.  A 50% eccentric stenosis is seen in the proximal vessel, but no other hemodynamically meaningful stenoses are seen. 4. The right coronary arteryis a very small-size non dominant vessel, free of significant disease.  5. The left ventricleis mild to moderately dilated. The left ventricle systolic function is severely decreased with an estimated ejection fraction of 25%. Regional wall motion abnormalities are not seen. No left ventricular thrombus is seen. There is mild mitral insufficiency. The ascending aorta appears normal. There is no aortic valve stenosis by pullback.    IMPRESSIONS: Although there is evidence of moderate coronary artery disease involving a dominant left circumflex artery this cannot explain the severity of the patient's global left ventricular dysfunction. He appears to have a nonischemic dilated cardiomyopathy of uncertain etiology. The presence of a left bundle branch block raises the opportunity for biventricular pacing/CRT. However the left bundle branch block appears to be an intermittent finding. Although he has conduction system disease, he does not have frequent ventricular pacing at this point (although this can be anticipated in the future).  RECOMMENDATION: Followup as scheduled with electrophysiology to discuss device upgrade to a dual-chamber biventricular pacemaker defibrillator. He meets criteria for ICD implantation by the SCD-HFT criteria. He is already on long-term treatment with metoprolol succinate and ramipril, albeit only moderate doses.  Coronary risk fracture modification. Consider switching to warfarin therapy due to history  of paroxysmal atrial fibrillation in the setting of severe left ventricular dysfunction.      Echo Study Conclusions from 11/2015  - Left ventricle: The cavity size was normal. Wall thickness was increased in a pattern of mild LVH. Systolic function was normal. The estimated ejection fraction was in the range of 50% to 55%. Regional wall motion abnormalities cannot be excluded. Doppler parameters are consistent with abnormal left ventricular relaxation (grade 1 diastolic dysfunction). The E/e&' ratio is between 8-15, suggesting indeterminate LV filling pressure. - Aorta: Ascending aortic diameter: 39 mm (S). - Ascending aorta: The ascending aorta was mildly dilated. - Left atrium: The atrium was normal in size. - Right atrium: The atrium was mildly dilated. - Tricuspid valve: There was trivial regurgitation. - Pulmonary arteries: PA peak pressure: 22 mm Hg (S). - Inferior vena cava: The vessel was normal in size. The respirophasic diameter changes were in the normal range (= 50%), consistent with normal central venous pressure.  Impressions:  - Compared to a prior echo in 2014, the LVEF has improved from 35-40% up to 50-55%. The ascending aorta is mildly dilated at 3.9 cm.   Assessment/Plan:  1. Chronic systolic dysfunction - most recent echo showing improvement in LV function - EF now basically normal.  I suspect most of his weight gain is due to alcohol consumption/excess calories.   2. Underlying BiV/ICD- noted by Dr. Rayann Heman that he has S[facility] Fort[employer]ura advisary noted. Patient is aware. Current recommendation from SJM is to not replace the device at this time. The patient is not device dependant. The patient has not had appropriate device therapy in the past or implanted for secondary prevention. He is actively remotely monitored and understands the importance of compliance today.   3. ETOH - says again that he is thinking about cutting  back. Unfortunately, he has said this before and not able to follow thru.   4. HTN - just took his medicines - recheck by me is a little lower. Will follow for now.   5. AFib - with a CHADS2VASC score is at least 6. Wewould therefore favor lifelong anticoagulation.  He is maintaining sinus rhythm with amiodarone 100mg  daily. Needs labs today. Has not had his coumadin checked since November - will get INR today. Explained importance of checking monthly.   6. Nonobstructive CAD- managed medically - no chest pain noted.   7. HLD- on low dose statin.    8. Noncompliance - unfortunately, I do not see this changing as well.     Current medicines are reviewed with the patient today.  The patient does not have concerns regarding medicines other than what has been noted above.  The following changes have been made:  See above.  Labs/ tests ordered today include:   No orders of the defined types were placed in this encounter.    Disposition:   FU with *** in {gen number [id number]} {Days to years:10300}.   Patient is agreeable to this plan and will call if any problems develop in the interim.   SignedTruitt Merle, NP  07/04/2016 7:30 AM  [place] [address] [place], [place]  [address] Phone: [phone] Fax: [phone]

## 2016-07-04 NOTE — Telephone Encounter (Signed)
LMOVM requesting that pt send manual transmission b/c home monitor has not updated in at least 7 days.    

## 2016-07-05 ENCOUNTER — Encounter: Payer: Self-pay | Admitting: Nurse Practitioner

## 2016-07-05 NOTE — Progress Notes (Signed)
No ICM remote transmission received for 07/01/2016 and next ICM transmission scheduled for 07/26/2016.

## 2016-07-15 ENCOUNTER — Telehealth: Payer: Self-pay | Admitting: Cardiology

## 2016-07-15 NOTE — Telephone Encounter (Signed)
LMOVM requesting that pt send manual transmission b/c home monitor has not updated in at least 7 days.    

## 2016-07-18 ENCOUNTER — Telehealth: Payer: Self-pay | Admitting: Internal Medicine

## 2016-07-18 DIAGNOSIS — G4733 Obstructive sleep apnea (adult) (pediatric): Secondary | ICD-10-CM

## 2016-07-18 NOTE — Telephone Encounter (Signed)
Spoke with [employer]. Informed her that BQ has given [place] the OK for a RX. RX has been printed, signed and faxed to the number given. She verbalized understanding. Nothing further needed at time of call.

## 2016-07-18 NOTE — Telephone Encounter (Signed)
OK to send order for CPAP 7cm H20 qHS

## 2016-07-18 NOTE — Telephone Encounter (Signed)
Spoke with patient's wife who stated that the patient's CPAP machine broke last night and they are out of town in [place], [place]. She contacted a place (which she could not remember the name of place, said it was affiliated with [facility]) and they would fix the machine or provide a loaner with an order.  Patient has not been seen for OSA since 2017. CPAP is set at 7cm.  BQ, since [place] is out of the office, can you please advise? Thanks.

## 2016-07-22 ENCOUNTER — Telehealth: Payer: Self-pay | Admitting: Cardiology

## 2016-07-22 NOTE — Telephone Encounter (Signed)
LMOVM requesting that pt send manual transmission b/c home monitor has not updated in at least 7 days.    

## 2016-07-26 ENCOUNTER — Telehealth: Payer: Self-pay | Admitting: Cardiology

## 2016-07-26 ENCOUNTER — Encounter: Payer: Medicare Other | Admitting: *Deleted

## 2016-07-26 NOTE — Telephone Encounter (Signed)
Spoke with pt and reminded pt of remote transmission that is due today. Pt verbalized understanding.   

## 2016-07-29 ENCOUNTER — Telehealth: Payer: Self-pay | Admitting: Cardiology

## 2016-07-29 NOTE — Telephone Encounter (Signed)
LMOVM requesting that pt send manual transmission b/c home monitor has not updated in at least 7 days.    

## 2016-08-05 ENCOUNTER — Telehealth: Payer: Self-pay | Admitting: Cardiology

## 2016-08-05 NOTE — Telephone Encounter (Signed)
LMOVM requesting that pt send manual transmission b/c home monitor has not updated in at least 7 days.    

## 2016-08-15 ENCOUNTER — Ambulatory Visit (INDEPENDENT_AMBULATORY_CARE_PROVIDER_SITE_OTHER)
Admission: RE | Admit: 2016-08-15 | Discharge: 2016-08-15 | Disposition: A | Discharge status: Home / Self Care | Payer: Medicare Other | Source: Ambulatory Visit | Attending: Internal Medicine | Admitting: Internal Medicine

## 2016-08-15 ENCOUNTER — Ambulatory Visit (INDEPENDENT_AMBULATORY_CARE_PROVIDER_SITE_OTHER): Payer: Medicare Other | Admitting: Internal Medicine

## 2016-08-15 ENCOUNTER — Ambulatory Visit: Payer: Medicare Other | Admitting: Internal Medicine

## 2016-08-15 ENCOUNTER — Encounter: Payer: Self-pay | Admitting: Internal Medicine

## 2016-08-15 ENCOUNTER — Other Ambulatory Visit (INDEPENDENT_AMBULATORY_CARE_PROVIDER_SITE_OTHER): Payer: Medicare Other

## 2016-08-15 VITALS — BP 150/84 | HR 67 | Temp 97.9°F | Resp 12 | Ht 72.0 in | Wt 201.0 lb

## 2016-08-15 DIAGNOSIS — E032 Hypothyroidism due to medicaments and other exogenous substances: Secondary | ICD-10-CM

## 2016-08-15 DIAGNOSIS — M546 Pain in thoracic spine: Secondary | ICD-10-CM | POA: Diagnosis not present

## 2016-08-15 DIAGNOSIS — M5134 Other intervertebral disc degeneration, thoracic region: Secondary | ICD-10-CM

## 2016-08-15 DIAGNOSIS — G8929 Other chronic pain: Secondary | ICD-10-CM | POA: Insufficient documentation

## 2016-08-15 DIAGNOSIS — F4323 Adjustment disorder with mixed anxiety and depressed mood: Secondary | ICD-10-CM | POA: Diagnosis not present

## 2016-08-15 DIAGNOSIS — I10 Essential (primary) hypertension: Secondary | ICD-10-CM | POA: Diagnosis not present

## 2016-08-15 LAB — BASIC METABOLIC PANEL
BUN: 15 mg/dL (ref 6–23)
CHLORIDE: 102 meq/L (ref 96–112)
CO2: 28 meq/L (ref 19–32)
CREATININE: 1.1 mg/dL (ref 0.40–1.50)
Calcium: 9.4 mg/dL (ref 8.4–10.5)
GFR: 68.69 mL/min (ref >60.00)
GLUCOSE: 129 mg/dL — AB (ref 70–99)
Potassium: 4.4 mEq/L (ref 3.5–5.1)
Sodium: 138 mEq/L (ref 135–145)

## 2016-08-15 MED ORDER — TRAMADOL HCL 50 MG PO TABS: 50.0000 mg | ORAL_TABLET | Freq: Four times a day (QID) | ORAL | 3 refills | Status: DC | PRN

## 2016-08-15 NOTE — Progress Notes (Signed)
Subjective:  Patient ID: Corey [name], male    DOB: [birth year]  Age: 81 y.o. MRN: [id number]  CC: Back Pain and Hypertension   HPI Corey [name] presents for f/up - he complains of a 2-3 month hx of right-sided T spine back after several falls, the falls are related to intermittent heavy EtOH intake. His last fall was several weeks ago. He takes tramadol to control the pain.  Outpatient Medications Prior to Visit  Medication Sig Dispense Refill  . albuterol (ACCUNEB) 0.63 MG/3ML nebulizer solution Inhale into the lungs.    Marland Kitchen albuterol (PROVENTIL HFA;VENTOLIN HFA) 108 (90 BASE) MCG/ACT inhaler Inhale 1-2 puffs into the lungs every 6 (six) hours as needed for shortness of breath. 1 Inhaler 11  . amiodarone (PACERONE) 200 MG tablet Take 0.5 tablets (100 mg total) by mouth daily. 45 tablet 2  . Cyanocobalamin (B-12) 100 MCG TABS Take by mouth.    . folic acid (FOLVITE) 1 MG tablet TAKE ONE TABLET EACH DAY 90 tablet 3  . levalbuterol (XOPENEX) 1.25 MG/3ML nebulizer solution Inhale into the lungs.    Marland Kitchen levothyroxine (SYNTHROID, LEVOTHROID) 125 MCG tablet Take 1 tablet (125 mcg total) by mouth daily. 90 tablet 1  . metoprolol succinate (TOPROL-XL) 50 MG 24 hr tablet TAKE ONE-HALF TABLET EVERY EVENING 15 tablet 11  . mometasone (NASONEX) 50 MCG/ACT nasal spray 1-2 puffs each nostril once or twice daily if needed 17 g 0  . NONFORMULARY OR COMPOUNDED ITEM Allergy Vaccine 1:10 Given at [facility]    . pantoprazole (PROTONIX) 40 MG tablet Take 1 tablet (40 mg total) by mouth daily. 90 tablet 3  . rosuvastatin (CRESTOR) 5 MG tablet TAKE ONE TABLET IN THE EVENING 30 tablet 11  . sertraline (ZOLOFT) 100 MG tablet Take 1 tablet (100 mg total) by mouth daily. 90 tablet 3  . spironolactone (ALDACTONE) 25 MG tablet TAKE ONE TABLET BY MOUTH ONCE DAILY 30 tablet 8  . umeclidinium-vilanterol (ANORO ELLIPTA) 62.5-25 MCG/INH AEPB Inhale 1 puff into the lungs daily. 90 each 3  . warfarin  (COUMADIN) 2.5 MG tablet TAKE AS DIRECTED BY COUMADIN CLINIC 35 tablet 3  . clonazePAM (KLONOPIN) 0.5 MG tablet TAKE 1/2 TO 2 TABLETS AS NEEDED FOR SLEEP 30 tablet 2  . polyethylene glycol powder (GLYCOLAX/MIRALAX) powder Take 17 g by mouth daily as needed (constipation).     . promethazine-dextromethorphan (PROMETHAZINE-DM) 6.25-15 MG/5ML syrup Take 5 mLs by mouth 4 (four) times daily as needed for cough. 118 mL 0  . traMADol (ULTRAM) 50 MG tablet TAKE ONE TABLET EVERY 6 HOURS AS NEEDED 20 tablet 1   No facility-administered medications prior to visit.     ROS Review of Systems  Constitutional: Negative.  Negative for appetite change, diaphoresis and fatigue.  HENT: Negative.  Negative for trouble swallowing.   Eyes: Negative.   Respiratory: Negative for cough, chest tightness, shortness of breath and wheezing.   Cardiovascular: Negative for chest pain, palpitations and leg swelling.  Gastrointestinal: Negative.  Negative for abdominal pain, constipation, diarrhea, nausea and vomiting.  Endocrine: Negative.  Negative for cold intolerance and heat intolerance.  Genitourinary: Negative.  Negative for difficulty urinating.  Musculoskeletal: Positive for back pain. Negative for arthralgias, joint swelling and myalgias.  Skin: Negative.   Allergic/Immunologic: Negative.   Neurological: Negative.  Negative for dizziness, weakness, light-headedness and numbness.  Hematological: Negative.  Negative for adenopathy. Does not bruise/bleed easily.  Psychiatric/Behavioral: Negative.     Objective:  BP (!) 150/84 (BP  Location: Left Arm, Patient Position: Sitting, Cuff Size: Normal)   Pulse 67   Temp 97.9 F (36.6 C) (Oral) Comment: unable to obtain  Resp 12   Ht 6' (1.829 m)   Wt 201 lb (91.2 kg)   SpO2 98%   BMI 27.26 kg/m   BP Readings from Last 3 Encounters:  08/15/16 (!) 150/84  06/08/16 132/78  06/07/16 134/78    Wt Readings from Last 3 Encounters:  08/15/16 201 lb (91.2 kg)    06/08/16 205 lb (93 kg)  06/07/16 205 lb (93 kg)    Physical Exam  Constitutional: He is oriented to person, place, and time. No distress.  HENT:  Mouth/Throat: Oropharynx is clear and moist. No oropharyngeal exudate.  Eyes: Conjunctivae are normal. Right eye exhibits no discharge. Left eye exhibits no discharge. No scleral icterus.  Neck: Normal range of motion. Neck supple. No JVD present. No thyromegaly present.  Cardiovascular: Normal rate, regular rhythm and intact distal pulses.  Exam reveals no gallop and no friction rub.   No murmur heard. Pulmonary/Chest: Effort normal and breath sounds normal. No respiratory distress. He has no wheezes. He has no rales. He exhibits no tenderness.  Abdominal: Soft. Bowel sounds are normal. He exhibits no distension and no mass. There is no tenderness. There is no rebound and no guarding.  Musculoskeletal: Normal range of motion. He exhibits no edema or deformity.       Thoracic back: He exhibits tenderness. He exhibits normal range of motion, no bony tenderness, no swelling, no edema, no deformity, no pain, no spasm and normal pulse.  Lymphadenopathy:    He has no cervical adenopathy.  Neurological: He is alert and oriented to person, place, and time.  Skin: Skin is warm and dry. No rash noted. He is not diaphoretic. No erythema. No pallor.  Vitals reviewed.   Lab Results  Component Value Date   WBC 4.8 03/01/2016   HGB 13.6 03/01/2016   HCT 41.0 03/01/2016   PLT 209 03/01/2016   GLUCOSE 129 (H) 08/15/2016   CHOL 183 12/01/2015   TRIG 184 (H) 12/01/2015   HDL 42 12/01/2015   LDLCALC 104 (H) 12/01/2015   ALT 43 06/09/2016   AST 64 (H) 06/09/2016   NA 138 08/15/2016   K 4.4 08/15/2016   CL 102 08/15/2016   CREATININE 1.10 08/15/2016   BUN 15 08/15/2016   CO2 28 08/15/2016   TSH 2.38 08/15/2016   PSA <0.015 05/26/2016   INR 1.89 06/03/2016   HGBA1C 5.5 11/18/2014    US Abdomen Limited Ruq  Result Date: 06/17/2016 CLINICAL  DATA:  Elevated liver function tests EXAM: US ABDOMEN LIMITED - RIGHT UPPER QUADRANT COMPARISON:  05/30/2014 FINDINGS: Gallbladder: No gallstones or wall thickening visualized. No sonographic Murphy sign noted by sonographer. Common bile duct: Diameter: 4 mm Liver: Diffusely increased in echogenicity without focal mass. IMPRESSION: Diffuse hepatic steatosis. Normal gallbladder and biliary tree. Electronically Signed   By: Marybelle Killings M.D.   On: 06/17/2016 09:09   No results found.   Assessment & Plan:   Jatavis was seen today for back pain and hypertension.  Diagnoses and all orders for this visit:  Hypothyroidism due to medication- his TSH is in the normal range, he will remain on the current dose of levothyroxine. -     Thyroid Panel With TSH; Future  Essential hypertension- his blood pressure is well-controlled, electrolytes and renal function are normal. -     Basic metabolic panel; Future  Chronic right-sided thoracic back pain- Plain film is negative for fracture but positive for DDD -     DG Thoracic Spine W/Swimmers; Future  DDD (degenerative disc disease), thoracic- will treat the pain with tramadol as needed -     traMADol (ULTRAM) 50 MG tablet; Take 1 tablet (50 mg total) by mouth every 6 (six) hours as needed.   I have discontinued Mr. Domanski polyethylene glycol powder, clonazePAM, and promethazine-dextromethorphan. I have also changed his traMADol. Additionally, I am having him maintain his albuterol, NONFORMULARY OR COMPOUNDED ITEM, B-12, levalbuterol, albuterol, umeclidinium-vilanterol, folic acid, rosuvastatin, mometasone, pantoprazole, levothyroxine, metoprolol succinate, sertraline, spironolactone, amiodarone, and warfarin.  Meds ordered this encounter  Medications  . traMADol (ULTRAM) 50 MG tablet    Sig: Take 1 tablet (50 mg total) by mouth every 6 (six) hours as needed.    Dispense:  75 tablet    Refill:  3     Follow-up: Return in about 3 months (around  11/15/2016).  Scarlette Calico, MD

## 2016-08-15 NOTE — Patient Instructions (Signed)

## 2016-08-16 LAB — THYROID PANEL WITH TSH
Free Thyroxine Index: 2.3 (ref 1.4–3.8)
T3 Uptake: 28 % (ref 22–35)
T4 TOTAL: 8.1 ug/dL (ref 4.5–12.0)
TSH: 2.38 m[IU]/L (ref 0.40–4.50)

## 2016-08-17 ENCOUNTER — Ambulatory Visit (INDEPENDENT_AMBULATORY_CARE_PROVIDER_SITE_OTHER): Payer: Medicare Other | Admitting: Gastroenterology

## 2016-08-17 DIAGNOSIS — Z23 Encounter for immunization: Secondary | ICD-10-CM | POA: Diagnosis not present

## 2016-08-17 DIAGNOSIS — K760 Fatty (change of) liver, not elsewhere classified: Secondary | ICD-10-CM

## 2016-08-18 ENCOUNTER — Ambulatory Visit (INDEPENDENT_AMBULATORY_CARE_PROVIDER_SITE_OTHER): Payer: Medicare Other | Admitting: *Deleted

## 2016-08-18 DIAGNOSIS — I428 Other cardiomyopathies: Secondary | ICD-10-CM | POA: Diagnosis not present

## 2016-08-18 DIAGNOSIS — Z95810 Presence of automatic (implantable) cardiac defibrillator: Secondary | ICD-10-CM | POA: Diagnosis not present

## 2016-08-18 DIAGNOSIS — I5022 Chronic systolic (congestive) heart failure: Secondary | ICD-10-CM

## 2016-08-18 NOTE — Progress Notes (Signed)
EPIC Encounter for ICM Monitoring  Patient Name: Corey [name] is a 81 y.o. male Date: 08/18/2016 Primary Care Physican: Janith Lima, MD Primary Cardiologist:Lori Servando Snare, NP Electrophysiologist: Allred Dry Weight:unknown  Bi-V Pacing: 98%             Heart Failure questions reviewed, pt asymptomatic.   Thoracic impedance normal but was abnormal suggesting fluid accumulation in June which correlates with patient was on vacation and eating out more.  Prescribed Spironolactone 25 mg 1 tablet daily  Recommendations: No changes.  Encouraged to call for fluid symptoms.  Follow-up plan: ICM clinic phone appointment on 09/19/2016.   Copy of ICM check sent to device physician.   3 month ICM trend: 08/18/2016   1 Year ICM trend:      Rosalene Billings, RN 08/18/2016 9:54 AM

## 2016-08-18 NOTE — Progress Notes (Signed)
ICD Remote Transmisison

## 2016-08-19 ENCOUNTER — Encounter: Payer: Self-pay | Admitting: Cardiology

## 2016-08-24 ENCOUNTER — Other Ambulatory Visit: Payer: Self-pay

## 2016-08-30 DIAGNOSIS — F4323 Adjustment disorder with mixed anxiety and depressed mood: Secondary | ICD-10-CM | POA: Diagnosis not present

## 2016-09-12 ENCOUNTER — Other Ambulatory Visit (INDEPENDENT_AMBULATORY_CARE_PROVIDER_SITE_OTHER): Payer: Medicare Other

## 2016-09-12 DIAGNOSIS — K739 Chronic hepatitis, unspecified: Secondary | ICD-10-CM | POA: Diagnosis not present

## 2016-09-12 LAB — HEPATIC FUNCTION PANEL
ALT: 52 U/L (ref 0–53)
AST: 82 U/L — ABNORMAL HIGH (ref 0–37)
Albumin: 3.7 g/dL (ref 3.5–5.2)
Alkaline Phosphatase: 89 U/L (ref 39–117)
BILIRUBIN DIRECT: 0.2 mg/dL (ref 0.0–0.3)
BILIRUBIN TOTAL: 0.7 mg/dL (ref 0.2–1.2)
Total Protein: 7.4 g/dL (ref 6.0–8.3)

## 2016-09-13 ENCOUNTER — Other Ambulatory Visit: Payer: Self-pay

## 2016-09-13 DIAGNOSIS — R945 Abnormal results of liver function studies: Principal | ICD-10-CM

## 2016-09-13 DIAGNOSIS — R7989 Other specified abnormal findings of blood chemistry: Secondary | ICD-10-CM

## 2016-09-15 LAB — CUP PACEART REMOTE DEVICE CHECK
Brady Statistic AP VS Percent: 1 %
Brady Statistic AS VS Percent: 1 %
Brady Statistic RA Percent Paced: 95 %
HIGH POWER IMPEDANCE MEASURED VALUE: 77 Ohm
HighPow Impedance: 77 Ohm
Implantable Lead Implant Date: 20130614
Implantable Lead Implant Date: 20131223
Implantable Lead Location: 753860
Implantable Lead Model: 4136
Implantable Lead Serial Number: 29201222
Implantable Pulse Generator Implant Date: 20131223
Lead Channel Impedance Value: 490 Ohm
Lead Channel Pacing Threshold Amplitude: 0.75 V
Lead Channel Pacing Threshold Amplitude: 0.75 V
Lead Channel Pacing Threshold Amplitude: 1.25 V
Lead Channel Pacing Threshold Pulse Width: 0.5 ms
Lead Channel Sensing Intrinsic Amplitude: 3.2 mV
Lead Channel Setting Pacing Amplitude: 2 V
Lead Channel Setting Pacing Amplitude: 2.5 V
Lead Channel Setting Pacing Pulse Width: 0.5 ms
Lead Channel Setting Pacing Pulse Width: 0.5 ms
MDC IDC LEAD IMPLANT DT: 20131223
MDC IDC LEAD LOCATION: 753858
MDC IDC LEAD LOCATION: 753859
MDC IDC MSMT BATTERY REMAINING LONGEVITY: 31 mo
MDC IDC MSMT BATTERY REMAINING PERCENTAGE: 38 %
MDC IDC MSMT BATTERY VOLTAGE: 2.9 V
MDC IDC MSMT LEADCHNL LV IMPEDANCE VALUE: 1100 Ohm
MDC IDC MSMT LEADCHNL LV PACING THRESHOLD PULSEWIDTH: 0.5 ms
MDC IDC MSMT LEADCHNL RV IMPEDANCE VALUE: 460 Ohm
MDC IDC MSMT LEADCHNL RV PACING THRESHOLD PULSEWIDTH: 0.5 ms
MDC IDC MSMT LEADCHNL RV SENSING INTR AMPL: 11.8 mV
MDC IDC SESS DTM: 20180726081111
MDC IDC SET LEADCHNL RA PACING AMPLITUDE: 2 V
MDC IDC SET LEADCHNL RV SENSING SENSITIVITY: 0.5 mV
MDC IDC STAT BRADY AP VP PERCENT: 94 %
MDC IDC STAT BRADY AS VP PERCENT: 3.8 %
Pulse Gen Serial Number: 7057550

## 2016-09-19 ENCOUNTER — Other Ambulatory Visit: Payer: Self-pay | Admitting: Internal Medicine

## 2016-09-19 ENCOUNTER — Ambulatory Visit (INDEPENDENT_AMBULATORY_CARE_PROVIDER_SITE_OTHER): Payer: Medicare Other

## 2016-09-19 ENCOUNTER — Other Ambulatory Visit (INDEPENDENT_AMBULATORY_CARE_PROVIDER_SITE_OTHER): Payer: Medicare Other

## 2016-09-19 DIAGNOSIS — E032 Hypothyroidism due to medicaments and other exogenous substances: Secondary | ICD-10-CM

## 2016-09-19 DIAGNOSIS — Z95810 Presence of automatic (implantable) cardiac defibrillator: Secondary | ICD-10-CM | POA: Diagnosis not present

## 2016-09-19 DIAGNOSIS — R945 Abnormal results of liver function studies: Principal | ICD-10-CM

## 2016-09-19 DIAGNOSIS — I5022 Chronic systolic (congestive) heart failure: Secondary | ICD-10-CM | POA: Diagnosis not present

## 2016-09-19 DIAGNOSIS — R7989 Other specified abnormal findings of blood chemistry: Secondary | ICD-10-CM

## 2016-09-19 LAB — HEPATIC FUNCTION PANEL
ALT: 81 U/L — ABNORMAL HIGH (ref 0–53)
AST: 151 U/L — AB (ref 0–37)
Albumin: 4.2 g/dL (ref 3.5–5.2)
Alkaline Phosphatase: 87 U/L (ref 39–117)
BILIRUBIN DIRECT: 0.2 mg/dL (ref 0.0–0.3)
BILIRUBIN TOTAL: 0.7 mg/dL (ref 0.2–1.2)
Total Protein: 7.6 g/dL (ref 6.0–8.3)

## 2016-09-19 LAB — CK: Total CK: 72 U/L (ref 7–232)

## 2016-09-19 NOTE — Progress Notes (Addendum)
EPIC Encounter for ICM Monitoring  Patient Name: Corey [name] is a 81 y.o. male Date: 09/19/2016 Primary Care Physican: Janith Lima, MD Primary Cardiologist:Lori Servando Snare, NP Electrophysiologist: Allred Dry Weight:unknown  Bi-V Pacing: 98%      Heart Failure questions reviewed, pt asymptomatic for fluid symptoms.  He did report dizziness, sweating, feeling very weak yesterday when he was out walking.  Advised this could be signs of dehydration since his report suggested some dryness in the last few days.    Thoracic impedance normal but was above baseline since 8/24 suggesting dryness.  Prescribed dosage: Spironolactone 25 mg 1 tablet daily  Recommendations:   Advised to increase fluid intake today and try to limit to 64 oz daily but increase intake if he is exercising and sweating.  Advised to take water with him when he is out walking. Encouraged to call for fluid symptoms.  Follow-up plan: ICM clinic phone appointment on 10/27/2016.  Advised patient to make routine appointment with Dr Rayann Heman and last visit was 05/2015  Copy of ICM check sent to Dr. Rayann Heman.   3 month ICM trend: 09/19/2016   1 Year ICM trend:      Rosalene Billings, RN 09/19/2016 11:20 AM

## 2016-09-20 DIAGNOSIS — F4323 Adjustment disorder with mixed anxiety and depressed mood: Secondary | ICD-10-CM | POA: Diagnosis not present

## 2016-09-21 ENCOUNTER — Other Ambulatory Visit: Payer: Self-pay

## 2016-09-21 DIAGNOSIS — R7989 Other specified abnormal findings of blood chemistry: Secondary | ICD-10-CM

## 2016-09-21 DIAGNOSIS — R945 Abnormal results of liver function studies: Principal | ICD-10-CM

## 2016-10-05 DIAGNOSIS — F4323 Adjustment disorder with mixed anxiety and depressed mood: Secondary | ICD-10-CM | POA: Diagnosis not present

## 2016-10-06 ENCOUNTER — Telehealth: Payer: Self-pay

## 2016-10-06 NOTE — Telephone Encounter (Signed)
Note from Psychiatric Nurse Specialist:   Corey [name] seen yesterday with spouse. Concerns of increased depression and alcohol consumption.    [phone] cell [facility]). She is sending over a note from the visit yesterday.

## 2016-10-06 NOTE — Telephone Encounter (Signed)
He should taken to a rehab center like [facility] or behavioral health

## 2016-10-07 NOTE — Telephone Encounter (Signed)
Spoke to [place] and gave MD response. She will contact pt and his spouse and inform of same.

## 2016-10-10 ENCOUNTER — Other Ambulatory Visit (INDEPENDENT_AMBULATORY_CARE_PROVIDER_SITE_OTHER): Payer: Medicare Other

## 2016-10-10 DIAGNOSIS — R7989 Other specified abnormal findings of blood chemistry: Secondary | ICD-10-CM

## 2016-10-10 DIAGNOSIS — R945 Abnormal results of liver function studies: Principal | ICD-10-CM

## 2016-10-10 LAB — HEPATIC FUNCTION PANEL
ALT: 68 U/L — AB (ref 0–53)
AST: 102 U/L — ABNORMAL HIGH (ref 0–37)
Albumin: 4.2 g/dL (ref 3.5–5.2)
Alkaline Phosphatase: 92 U/L (ref 39–117)
Bilirubin, Direct: 0.1 mg/dL (ref 0.0–0.3)
TOTAL PROTEIN: 7.7 g/dL (ref 6.0–8.3)
Total Bilirubin: 0.6 mg/dL (ref 0.2–1.2)

## 2016-10-12 LAB — ANTI-SMOOTH MUSCLE ANTIBODY, IGG: Actin (Smooth Muscle) Antibody (IGG): <20 U

## 2016-10-12 LAB — IGG: IgG (Immunoglobin G), Serum: 1102 mg/dL (ref 694–1618)

## 2016-10-12 LAB — ANTI-NUCLEAR AB-TITER (ANA TITER)

## 2016-10-12 LAB — ANA: Anti Nuclear Antibody(ANA): POSITIVE — AB

## 2016-10-12 LAB — MITOCHONDRIAL ANTIBODIES: Mitochondrial M2 Ab, IgG: <=20 U

## 2016-10-17 ENCOUNTER — Telehealth: Payer: Self-pay

## 2016-10-17 NOTE — Telephone Encounter (Signed)
-----   Message from Yetta Flock, MD sent at 10/16/2016  4:44 PM EDT ----- Mary Sella can you please let Mr. Natzke know that his labs for autoimmune liver disease mostly negative (mildly positive ANA but all others negative). I suspect his ongoing liver enzyme elevation is due to alcohol, for which I have recommended abstinence. I will see him in clinic for a follow up visit that is already scheduled in October. Thanks

## 2016-10-17 NOTE — Telephone Encounter (Signed)
Called and spoke to pt, notified of results and recommendation.  He expressed understanding

## 2016-10-26 ENCOUNTER — Other Ambulatory Visit: Payer: Self-pay | Admitting: Internal Medicine

## 2016-10-27 ENCOUNTER — Ambulatory Visit (INDEPENDENT_AMBULATORY_CARE_PROVIDER_SITE_OTHER): Payer: Medicare Other

## 2016-10-27 ENCOUNTER — Telehealth: Payer: Self-pay

## 2016-10-27 DIAGNOSIS — Z95810 Presence of automatic (implantable) cardiac defibrillator: Secondary | ICD-10-CM

## 2016-10-27 DIAGNOSIS — I5022 Chronic systolic (congestive) heart failure: Secondary | ICD-10-CM

## 2016-10-27 NOTE — Telephone Encounter (Signed)
Remote ICM transmission received.  Attempted call to patient and left detailed message regarding transmission and next ICM scheduled for 11/29/2016.  Advised to return call for any fluid symptoms or questions.

## 2016-10-27 NOTE — Progress Notes (Signed)
EPIC Encounter for ICM Monitoring  Patient Name: Corey [name] is a 81 y.o. male Date: 10/27/2016 Primary Care Physican: Janith Lima, MD Primary Cardiologist:Lori Servando Snare, NP Electrophysiologist: Allred Dry Weight:unknown Bi-V Pacing: 98%       Attempted call to patient and unable to reach.  Left detailed message regarding transmission.  Transmission reviewed.    Thoracic impedance abnormal suggesting fluid accumulation from 10/20/2016 but is back at baseline today.  No diuretic  Recommendations: Left voice mail with ICM number and encouraged to call for fluid symptoms.  Follow-up plan: ICM clinic phone appointment on 11/29/2016.  Office appointment scheduled 12/14/2016 with Dr. Rayann Heman.  Copy of ICM check sent to Dr. Rayann Heman.   3 month ICM trend: 10/27/2016   Direct Trend View    1 Year ICM trend:      Rosalene Billings, RN 10/27/2016 8:36 AM

## 2016-11-09 ENCOUNTER — Ambulatory Visit: Payer: Medicare Other | Admitting: Internal Medicine

## 2016-11-14 ENCOUNTER — Encounter: Payer: Medicare Other | Admitting: Internal Medicine

## 2016-11-16 ENCOUNTER — Other Ambulatory Visit (INDEPENDENT_AMBULATORY_CARE_PROVIDER_SITE_OTHER): Payer: Medicare Other

## 2016-11-16 ENCOUNTER — Ambulatory Visit (INDEPENDENT_AMBULATORY_CARE_PROVIDER_SITE_OTHER): Payer: Medicare Other | Admitting: Internal Medicine

## 2016-11-16 ENCOUNTER — Encounter: Payer: Self-pay | Admitting: Gastroenterology

## 2016-11-16 ENCOUNTER — Ambulatory Visit: Payer: Medicare Other | Admitting: Gastroenterology

## 2016-11-16 ENCOUNTER — Encounter: Payer: Self-pay | Admitting: Internal Medicine

## 2016-11-16 ENCOUNTER — Other Ambulatory Visit: Payer: Self-pay

## 2016-11-16 ENCOUNTER — Encounter (INDEPENDENT_AMBULATORY_CARE_PROVIDER_SITE_OTHER): Payer: Self-pay

## 2016-11-16 ENCOUNTER — Ambulatory Visit (INDEPENDENT_AMBULATORY_CARE_PROVIDER_SITE_OTHER): Payer: Medicare Other | Admitting: Gastroenterology

## 2016-11-16 VITALS — BP 116/68 | HR 60 | Ht 72.0 in | Wt 201.0 lb

## 2016-11-16 VITALS — BP 140/80 | HR 68 | Temp 97.8°F | Ht 72.0 in | Wt 202.0 lb

## 2016-11-16 DIAGNOSIS — E785 Hyperlipidemia, unspecified: Secondary | ICD-10-CM

## 2016-11-16 DIAGNOSIS — Z23 Encounter for immunization: Secondary | ICD-10-CM

## 2016-11-16 DIAGNOSIS — R945 Abnormal results of liver function studies: Secondary | ICD-10-CM | POA: Diagnosis not present

## 2016-11-16 DIAGNOSIS — R7989 Other specified abnormal findings of blood chemistry: Secondary | ICD-10-CM

## 2016-11-16 DIAGNOSIS — K709 Alcoholic liver disease, unspecified: Secondary | ICD-10-CM

## 2016-11-16 DIAGNOSIS — I2510 Atherosclerotic heart disease of native coronary artery without angina pectoris: Secondary | ICD-10-CM | POA: Diagnosis not present

## 2016-11-16 DIAGNOSIS — K649 Unspecified hemorrhoids: Secondary | ICD-10-CM

## 2016-11-16 DIAGNOSIS — E032 Hypothyroidism due to medicaments and other exogenous substances: Secondary | ICD-10-CM

## 2016-11-16 DIAGNOSIS — R739 Hyperglycemia, unspecified: Secondary | ICD-10-CM

## 2016-11-16 DIAGNOSIS — K5900 Constipation, unspecified: Secondary | ICD-10-CM

## 2016-11-16 DIAGNOSIS — E118 Type 2 diabetes mellitus with unspecified complications: Secondary | ICD-10-CM

## 2016-11-16 DIAGNOSIS — I10 Essential (primary) hypertension: Secondary | ICD-10-CM | POA: Diagnosis not present

## 2016-11-16 LAB — CBC WITH DIFFERENTIAL/PLATELET
BASOS PCT: 0.3 % (ref 0.0–3.0)
Basophils Absolute: 0 10*3/uL (ref 0.0–0.1)
EOS PCT: 4.1 % (ref 0.0–5.0)
Eosinophils Absolute: 0.2 10*3/uL (ref 0.0–0.7)
HEMATOCRIT: 44.2 % (ref 39.0–52.0)
Hemoglobin: 14.7 g/dL (ref 13.0–17.0)
Lymphocytes Relative: 33.7 % (ref 12.0–46.0)
Lymphs Abs: 2 10*3/uL (ref 0.7–4.0)
MCHC: 33.2 g/dL (ref 30.0–36.0)
MCV: 91.2 fl (ref 78.0–100.0)
MONOS PCT: 8 % (ref 3.0–12.0)
Monocytes Absolute: 0.5 10*3/uL (ref 0.1–1.0)
NEUTROS ABS: 3.2 10*3/uL (ref 1.4–7.7)
Neutrophils Relative %: 53.9 % (ref 43.0–77.0)
PLATELETS: 199 10*3/uL (ref 150.0–400.0)
RBC: 4.85 Mil/uL (ref 4.22–5.81)
RDW: 14.1 % (ref 11.5–15.5)
WBC: 5.9 10*3/uL (ref 4.0–10.5)

## 2016-11-16 LAB — LIPID PANEL
CHOL/HDL RATIO: 4
Cholesterol: 168 mg/dL (ref 0–200)
HDL: 40.2 mg/dL (ref >39.00)
LDL Cholesterol: 99 mg/dL (ref 0–99)
NONHDL: 128.25
Triglycerides: 147 mg/dL (ref 0.0–149.0)
VLDL: 29.4 mg/dL (ref 0.0–40.0)

## 2016-11-16 LAB — HEPATIC FUNCTION PANEL
ALBUMIN: 4.2 g/dL (ref 3.5–5.2)
ALK PHOS: 87 U/L (ref 39–117)
ALT: 81 U/L — ABNORMAL HIGH (ref 0–53)
AST: 95 U/L — AB (ref 0–37)
Bilirubin, Direct: 0.2 mg/dL (ref 0.0–0.3)
Total Bilirubin: 0.6 mg/dL (ref 0.2–1.2)
Total Protein: 7.5 g/dL (ref 6.0–8.3)

## 2016-11-16 LAB — HEMOGLOBIN A1C: Hgb A1c MFr Bld: 7.3 % — ABNORMAL HIGH (ref 4.6–6.5)

## 2016-11-16 LAB — TSH: TSH: 3.63 u[IU]/mL (ref 0.35–4.50)

## 2016-11-16 NOTE — Patient Instructions (Signed)
Hypothyroidism Hypothyroidism is a disorder of the thyroid. The thyroid is a large gland that is located in the lower front of the neck. The thyroid releases hormones that control how the body works. With hypothyroidism, the thyroid does not make enough of these hormones. What are the causes? Causes of hypothyroidism may include:  Viral infections.  Pregnancy.  Your own defense system (immune system) attacking your thyroid.  Certain medicines.  Birth defects.  Past radiation treatments to your head or neck.  Past treatment with radioactive iodine.  Past surgical removal of part or all of your thyroid.  Problems with the gland that is located in the center of your brain (pituitary).  What are the signs or symptoms? Signs and symptoms of hypothyroidism may include:  Feeling as though you have no energy (lethargy).  Inability to tolerate cold.  Weight gain that is not explained by a change in diet or exercise habits.  Dry skin.  Coarse hair.  Menstrual irregularity.  Slowing of thought processes.  Constipation.  Sadness or depression.  How is this diagnosed? Your health care provider may diagnose hypothyroidism with blood tests and ultrasound tests. How is this treated? Hypothyroidism is treated with medicine that replaces the hormones that your body does not make. After you begin treatment, it may take several weeks for symptoms to go away. Follow these instructions at home:  Take medicines only as directed by your health care provider.  If you start taking any new medicines, tell your health care provider.  Keep all follow-up visits as directed by your health care provider. This is important. As your condition improves, your dosage needs may change. You will need to have blood tests regularly so that your health care provider can watch your condition. Contact a health care provider if:  Your symptoms do not get better with treatment.  You are taking thyroid  replacement medicine and: ? You sweat excessively. ? You have tremors. ? You feel anxious. ? You lose weight rapidly. ? You cannot tolerate heat. ? You have emotional swings. ? You have diarrhea. ? You feel weak. Get help right away if:  You develop chest pain.  You develop an irregular heartbeat.  You develop a rapid heartbeat. This information is not intended to replace advice given to you by your health care provider. Make sure you discuss any questions you have with your health care provider. Document Released: 01/10/2005 Document Revised: 06/18/2015 Document Reviewed: 05/28/2013 Elsevier Interactive Patient Education  2017 [employer]  

## 2016-11-16 NOTE — Progress Notes (Signed)
Subjective:  Patient ID: Corey [name], male    DOB: 09-12-1935  Age: 81 y.o. MRN: [id number]  CC: Hyperlipidemia and Hypothyroidism   HPI Corey [name] presents for f/up - he complains of fatigue and forgetfulness.  He recently had a thorough workup by GI for fatty liver disease related to alcohol intake.  He is not willing to stop drinking.  Outpatient Medications Prior to Visit  Medication Sig Dispense Refill  . albuterol (ACCUNEB) 0.63 MG/3ML nebulizer solution Inhale into the lungs.    Marland Kitchen albuterol (PROVENTIL HFA;VENTOLIN HFA) 108 (90 BASE) MCG/ACT inhaler Inhale 1-2 puffs into the lungs every 6 (six) hours as needed for shortness of breath. 1 Inhaler 11  . amiodarone (PACERONE) 200 MG tablet Take 0.5 tablets (100 mg total) by mouth daily. 45 tablet 2  . folic acid (FOLVITE) 1 MG tablet TAKE ONE TABLET EACH DAY 90 tablet 3  . levalbuterol (XOPENEX) 1.25 MG/3ML nebulizer solution Inhale into the lungs.    Marland Kitchen levothyroxine (SYNTHROID, LEVOTHROID) 125 MCG tablet TAKE ONE TABLET EACH DAY 90 tablet 1  . metoprolol succinate (TOPROL-XL) 50 MG 24 hr tablet TAKE ONE-HALF TABLET EVERY EVENING 15 tablet 11  . mometasone (NASONEX) 50 MCG/ACT nasal spray 1-2 puffs each nostril once or twice daily if needed 17 g 0  . NONFORMULARY OR COMPOUNDED ITEM Allergy Vaccine 1:10 Given at [facility]    . pantoprazole (PROTONIX) 40 MG tablet Take 1 tablet (40 mg total) by mouth daily. 90 tablet 3  . rosuvastatin (CRESTOR) 5 MG tablet Take 1 tablet (5 mg total) by mouth daily at 6 PM. 90 tablet 1  . sertraline (ZOLOFT) 100 MG tablet Take 1 tablet (100 mg total) by mouth daily. 90 tablet 3  . spironolactone (ALDACTONE) 25 MG tablet TAKE ONE TABLET BY MOUTH ONCE DAILY 30 tablet 8  . traMADol (ULTRAM) 50 MG tablet Take 1 tablet (50 mg total) by mouth every 6 (six) hours as needed. 75 tablet 3  . umeclidinium-vilanterol (ANORO ELLIPTA) 62.5-25 MCG/INH AEPB Inhale 1 puff into the lungs daily. 90 each  3  . warfarin (COUMADIN) 2.5 MG tablet TAKE AS DIRECTED BY COUMADIN CLINIC 35 tablet 3   No facility-administered medications prior to visit.     ROS Review of Systems  Constitutional: Positive for fatigue. Negative for appetite change, chills, diaphoresis and unexpected weight change.  HENT: Negative.  Negative for trouble swallowing.   Eyes: Negative.   Respiratory: Negative.  Negative for cough, chest tightness, shortness of breath and wheezing.   Cardiovascular: Negative for chest pain, palpitations and leg swelling.  Gastrointestinal: Negative for abdominal pain, constipation, diarrhea, nausea and vomiting.  Endocrine: Negative for polydipsia, polyphagia and polyuria.  Genitourinary: Negative.  Negative for difficulty urinating, dysuria, frequency and urgency.  Musculoskeletal: Negative.  Negative for back pain and myalgias.  Skin: Negative.   Allergic/Immunologic: Negative.   Neurological: Negative.  Negative for dizziness, weakness, light-headedness, numbness and headaches.  Hematological: Negative for adenopathy. Does not bruise/bleed easily.  Psychiatric/Behavioral: Negative.     Objective:  BP 140/80 (BP Location: Left Arm, Patient Position: Sitting, Cuff Size: Normal)   Pulse 68   Temp 97.8 F (36.6 C) (Oral)   Ht 6' (1.829 m)   Wt 202 lb (91.6 kg)   SpO2 97%   BMI 27.40 kg/m   BP Readings from Last 3 Encounters:  11/16/16 140/80  11/16/16 116/68  08/15/16 (!) 150/84    Wt Readings from Last 3 Encounters:  11/16/16 202  lb (91.6 kg)  11/16/16 201 lb (91.2 kg)  08/15/16 201 lb (91.2 kg)    Physical Exam  Constitutional: He is oriented to person, place, and time. No distress.  HENT:  Mouth/Throat: Oropharynx is clear and moist. No oropharyngeal exudate.  Eyes: Conjunctivae are normal. Right eye exhibits no discharge. Left eye exhibits no discharge. No scleral icterus.  Neck: Normal range of motion. Neck supple. No JVD present. No thyromegaly present.    Cardiovascular: Normal rate, regular rhythm and intact distal pulses.  Exam reveals no gallop and no friction rub.   No murmur heard. Pulmonary/Chest: Effort normal and breath sounds normal. No respiratory distress. He has no wheezes. He has no rales. He exhibits no tenderness.  Abdominal: Soft. Bowel sounds are normal. He exhibits no distension and no mass. There is no tenderness. There is no rebound and no guarding.  Musculoskeletal: Normal range of motion. He exhibits no edema, tenderness or deformity.  Lymphadenopathy:    He has no cervical adenopathy.  Neurological: He is alert and oriented to person, place, and time.  Skin: Skin is warm and dry. No rash noted. He is not diaphoretic. No erythema. No pallor.  Vitals reviewed.   Lab Results  Component Value Date   WBC 5.9 11/16/2016   HGB 14.7 11/16/2016   HCT 44.2 11/16/2016   PLT 199.0 11/16/2016   GLUCOSE 129 (H) 08/15/2016   CHOL 168 11/16/2016   TRIG 147.0 11/16/2016   HDL 40.20 11/16/2016   LDLCALC 99 11/16/2016   ALT 81 (H) 11/16/2016   AST 95 (H) 11/16/2016   NA 138 08/15/2016   K 4.4 08/15/2016   CL 102 08/15/2016   CREATININE 1.10 08/15/2016   BUN 15 08/15/2016   CO2 28 08/15/2016   TSH 3.63 11/16/2016   PSA <0.015 05/26/2016   INR 1.89 06/03/2016   HGBA1C 7.3 (H) 11/16/2016    Dg Thoracic Spine W/swimmers  Result Date: 08/15/2016 CLINICAL DATA:  Fall 3 months ago, pain EXAM: THORACIC SPINE - 3 VIEWS COMPARISON:  None. FINDINGS: Normal alignment. No fracture. Mild disc space narrowing and spurring in the midthoracic spine. IMPRESSION: No acute bony abnormality. Electronically Signed   By: Rolm Baptise M.D.   On: 08/15/2016 11:35    Assessment & Plan:   Corey was seen today for hyperlipidemia and hypothyroidism.  Diagnoses and all orders for this visit:  Need for influenza vaccination -     Flu vaccine HIGH DOSE PF (Fluzone High dose)  Atherosclerosis of native coronary artery of native heart without  angina pectoris- he's had no recent symptoms of angina.  Will continue aggressive risk factor modifications. -     Lipid panel; Future  Essential hypertension- his blood pressure is well controlled.  Electrolytes and renal function are normal.  Hypothyroidism due to medication- his TSH is in the normal range.  He will remain on the current dose of levothyroxine. -     TSH; Future  Hyperglycemia- he has developed type 2 diabetes mellitus. -     Hemoglobin A1c; Future  Hyperlipidemia with target LDL less than 100- he has achieved his LDL goal and is doing well on the statin. -     Lipid panel; Future  Type 2 diabetes mellitus with complication, without long-term current use of insulin ([place])- his A1c is up to 7.3%.  He has new onset diabetes mellitus.  Medical therapy is not indicated at this time.  He will work on his lifestyle modifications.   I am  having Mr. [name] maintain his albuterol, NONFORMULARY OR COMPOUNDED ITEM, levalbuterol, albuterol, umeclidinium-vilanterol, folic acid, mometasone, pantoprazole, metoprolol succinate, sertraline, spironolactone, amiodarone, warfarin, traMADol, levothyroxine, and rosuvastatin.  No orders of the defined types were placed in this encounter.    Follow-up: Return in about 4 months (around 03/19/2017).  Scarlette Calico, MD

## 2016-11-16 NOTE — Patient Instructions (Signed)
Your physician has requested that you go to the basement for the following lab work before leaving today: CBC, LFTs  Please begin taking an over the counter fiber supplement such as Citrucel.  If you are age 79 or older, your body mass index should be between 23-30. Your Body mass index is 27.26 kg/m. If this is out of the aforementioned range listed, please consider follow up with your Primary Care Provider.  If you are age 58 or younger, your body mass index should be between 19-25. Your Body mass index is 27.26 kg/m. If this is out of the aformentioned range listed, please consider follow up with your Primary Care Provider.   Thank you.

## 2016-11-16 NOTE — Progress Notes (Signed)
HPI :  81 year old male with medical history as outlined below, here for follow-up visit.  At the last visit he complained of constipation and hemorrhoids which had bothered him. I had recommended he increase MiraLAX to twice a day, avoid straining, and use hydrocortisone cream as needed for hemorrhoids given his Coumadin use, as he is not a candidate for hemorrhoidal banding. He reports he has had some intermittent issues with constipation, but hemorrhoids are not bothering him recently. He states constipation continues to be an issue at times, but he stopped MiraLAX, he thought it may be too strong for him.  Elevated liver enzymes - thought to be due to fatty liver secondary to alcohol use. Labs sent for viral hepatitis not immune to hep A/B, sent for vaccine. He was told to stop drinking alcohol. AST rose from 64 to 151, with ALT of 81. CK level sent and normal. He stopped drinking and AST went down to 102, ALT 68.  ANA wa positive at 1:40, AMA negative, SMA negative, IgG normal,  He reports since his last visit he has significantly cut back the volume of alcohol he has consumed. He reports he continues to consume about 2 alcoholic drinks per night, he states he previously drank at least 4 drinks a night.  He otherwise endorses fatigue which is bothering him more than usual lately.  Prior workup for issues in the past: EGD 06/18/14 - bleeding small gastric polyps removed EGD 01/22/14 - bleeding large gastric polyp removed Colonoscopy 1/15 - severe diverticulosis left colon, hemorrhoids Flex sig 12/2013 - severe left sided diverticulosis, hemorrhoids CT abdomen 10/05/14 - normal liver, GB, pancreas. Diverticulosis noted US abdomen 05/30/14 - no gallstones, normal GB, fatty liver, normal spleen US 06/17/16 - diffuse hepatic steatosis, no cirrhosis   Past Medical History:  Diagnosis Date  . Alcohol abuse, episodic 05/19/2008  . ALLERGIC RHINITIS 04/15/2009  . Allergy    YEAR ROUND,TAKE SHOTS    . Alzheimer's dementia    beginning with sx  . Anxiety   . Arthritis   . ASTHMA 11/16/2006   sinusitis-    hx ?yeast patch/white patch on vocal cord as per [facility] 02/25/11-   . Atrial fibrillation ([place]) 11/16/2006   remote CHF related to atrial fib with rapid ventricular response over 25 yrs per office note,  . Cancer [facility]) 2002   prostate cancer  . Chronic systolic CHF (congestive heart failure) (HCC)    echo (10/02/12): EF 00-86%, grade 1 diastolic dysfunction, trivial AI, mild MR, mild RVE  . COLONIC POLYPS, ADENOMATOUS, HX OF 02/14/2008  . CORONARY ARTERY DISEASE 11/16/2006   LHC (07/2011): LAD with luminal irregularities, proximal circumflex 50%, EF 25%  . Depression   . DIVERTICULOSIS, COLON 02/14/2008  . ESOPHAGEAL STRICTURE 01/28/2008  . GERD 02/14/2008  . Headache(784.0)   . HYPERGLYCEMIA 11/16/2006  . HYPERLIPIDEMIA 02/14/2008  . HYPERTENSION 11/16/2006  . ICD (implantable cardiac defibrillator) in place   . INSOMNIA-SLEEP DISORDER-UNSPEC 02/11/2009  . Lesion of vocal cord    bx begine  . Myocardial infarction ([place]) 1993  . NICM (nonischemic cardiomyopathy) ([place])   . OBSTRUCTIVE SLEEP APNEA 11/10/2008  . 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 Pacemaker    St. Jude  . Pneumonia    hx of  . PROSTATE CANCER, HX OF 02/25/2000  . SLEEP APNEA 10/03/2009   LOV Dr [name] 12/12 in EPIC    Moderate per patient- settings ?6/last sleep study years ago  . Sleep apnea   . Stroke (  [place])    [place]  . Symptomatic bradycardia, secondary to sinus node dysfunction 07/08/2011   s/p [facility] Scientific PPM by Dr Sallyanne Kuster, upgraded to [place] ICD 12/13 by Dr Rayann Heman (SJM)  . TRANSIENT ISCHEMIC ATTACKS, HX OF 11/16/2006     Past Surgical History:  Procedure Laterality Date  . BACK SURGERY    . BI-VENTRICULAR IMPLANTABLE CARDIOVERTER DEFIBRILLATOR UPGRADE N/A 01/16/2012   Procedure: BI-VENTRICULAR IMPLANTABLE CARDIOVERTER DEFIBRILLATOR UPGRADE;  Surgeon: Thompson Grayer, MD;  Location: [facility] CATH LAB;  Service:  Cardiovascular;  Laterality: N/A;  . CARDIAC CATHETERIZATION     several  . CARDIAC DEFIBRILLATOR PLACEMENT  01/16/12   Upgrade to a biventricular SJM ICD by DR Allred  . COLONOSCOPY    . ESOPHAGOGASTRODUODENOSCOPY     with dilitation  . INSERT / REPLACE / REMOVE PACEMAKER    . KNEE ARTHROSCOPY     2 surgeries right and 1 left  . LEFT AND RIGHT HEART CATHETERIZATION WITH CORONARY ANGIOGRAM N/A 08/18/2011   Procedure: LEFT AND RIGHT HEART CATHETERIZATION WITH CORONARY ANGIOGRAM;  Surgeon: [name], MD;  Location: [place] CATH LAB;  Service: Cardiovascular;  Laterality: N/A;  . LUMBAR LAMINECTOMY/DECOMPRESSION MICRODISCECTOMY  03/02/2011   Procedure: LUMBAR LAMINECTOMY/DECOMPRESSION MICRODISCECTOMY;  Surgeon: [name], MD;  Location: [facility] ORS;  Service: Orthopedics;  Laterality: N/A;  Decompression Lumbar 4 - Lumbar(X-Ray)  . PACEMAKER INSERTION  06/2011   [employer] PPM implanted by Dr Sallyanne Kuster  . PERMANENT PACEMAKER INSERTION Left 07/08/2011   Procedure: PERMANENT PACEMAKER INSERTION;  Surgeon: [name], MD;  Location: [place] CATH LAB;  Service: Cardiovascular;  Laterality: Left;  . PROSTATE SURGERY  2/02   prostate cancer  . TONSILECTOMY, ADENOIDECTOMY, BILATERAL MYRINGOTOMY AND TUBES    . TONSILLECTOMY  as child  . TOTAL KNEE ARTHROPLASTY Left 06/04/2012   Procedure: TOTAL KNEE ARTHROPLASTY;  Surgeon: [name], MD;  Location: [facility] ORS;  Service: Orthopedics;  Laterality: Left;  . UVULOPALATOPHARYNGOPLASTY  2013  . vocal cord lesion bx     Family History  Problem Relation Age of Onset  . Heart attack Father   . Heart attack Brother   . Heart disease Maternal Uncle   . Heart attack Maternal Uncle   . Colon cancer Neg Hx   . Esophageal cancer Neg Hx   . Rectal cancer Neg Hx   . Prostate cancer Neg Hx   . Stomach cancer Neg Hx    Social History  Substance Use Topics  . Smoking status: Former Smoker    Packs/day: 1.00    Years: 4.00    Types: Cigarettes     Quit date: 03/22/1964  . Smokeless tobacco: Never Used     Comment: reports smoked socially  . Alcohol use 12.0 oz/week    20 Shots of liquor per week     Comment: 2 mixed drinks daily, some days   Current Outpatient Prescriptions  Medication Sig Dispense Refill  . albuterol (ACCUNEB) 0.63 MG/3ML nebulizer solution Inhale into the lungs.    Marland Kitchen albuterol (PROVENTIL HFA;VENTOLIN HFA) 108 (90 BASE) MCG/ACT inhaler Inhale 1-2 puffs into the lungs every 6 (six) hours as needed for shortness of breath. 1 Inhaler 11  . amiodarone (PACERONE) 200 MG tablet Take 0.5 tablets (100 mg total) by mouth daily. 45 tablet 2  . folic acid (FOLVITE) 1 MG tablet TAKE ONE TABLET EACH DAY 90 tablet 3  . levalbuterol (XOPENEX) 1.25 MG/3ML nebulizer solution Inhale into the lungs.    Marland Kitchen levothyroxine (SYNTHROID, LEVOTHROID) 125 MCG  tablet TAKE ONE TABLET EACH DAY 90 tablet 1  . metoprolol succinate (TOPROL-XL) 50 MG 24 hr tablet TAKE ONE-HALF TABLET EVERY EVENING 15 tablet 11  . mometasone (NASONEX) 50 MCG/ACT nasal spray 1-2 puffs each nostril once or twice daily if needed 17 g 0  . NONFORMULARY OR COMPOUNDED ITEM Allergy Vaccine 1:10 Given at [facility]    . pantoprazole (PROTONIX) 40 MG tablet Take 1 tablet (40 mg total) by mouth daily. 90 tablet 3  . rosuvastatin (CRESTOR) 5 MG tablet Take 1 tablet (5 mg total) by mouth daily at 6 PM. 90 tablet 1  . sertraline (ZOLOFT) 100 MG tablet Take 1 tablet (100 mg total) by mouth daily. 90 tablet 3  . spironolactone (ALDACTONE) 25 MG tablet TAKE ONE TABLET BY MOUTH ONCE DAILY 30 tablet 8  . traMADol (ULTRAM) 50 MG tablet Take 1 tablet (50 mg total) by mouth every 6 (six) hours as needed. 75 tablet 3  . umeclidinium-vilanterol (ANORO ELLIPTA) 62.5-25 MCG/INH AEPB Inhale 1 puff into the lungs daily. 90 each 3  . warfarin (COUMADIN) 2.5 MG tablet TAKE AS DIRECTED BY COUMADIN CLINIC 35 tablet 3   No current facility-administered medications for this visit.     Allergies  Allergen Reactions  . Altace [Ramipril] Cough  . Hydrocodone Other (See Comments)    Severe panic attacks  . Oxycodone Other (See Comments)    Severe panic attacks     Review of Systems: All systems reviewed and negative except where noted in HPI.   Lab Results  Component Value Date   WBC 5.9 11/16/2016   HGB 14.7 11/16/2016   HCT 44.2 11/16/2016   MCV 91.2 11/16/2016   PLT 199.0 11/16/2016   Lab Results  Component Value Date   ALT 81 (H) 11/16/2016   AST 95 (H) 11/16/2016   ALKPHOS 87 11/16/2016   BILITOT 0.6 11/16/2016    Lab Results  Component Value Date   CREATININE 1.10 08/15/2016   BUN 15 08/15/2016   NA 138 08/15/2016   K 4.4 08/15/2016   CL 102 08/15/2016   CO2 28 08/15/2016    Lab Results  Component Value Date   INR 1.89 06/03/2016   INR 2.2 05/30/2016   INR 2.4 05/02/2016     Physical Exam: BP 116/68   Pulse 60   Ht 6' (1.829 m)   Wt 201 lb (91.2 kg)   BMI 27.26 kg/m  Constitutional: Pleasant,well-developed, male in no acute distress. HEENT: Normocephalic and atraumatic. Conjunctivae are normal. No scleral icterus. Neck supple.  Cardiovascular: Normal rate, regular rhythm.  Pulmonary/chest: Effort normal and breath sounds normal. No wheezing, rales or rhonchi. Abdominal: Soft, nondistended, nontender.  There are no masses palpable. No hepatomegaly. Extremities: no edema Lymphadenopathy: No cervical adenopathy noted. Neurological: Alert and oriented to person place and time. Skin: Skin is warm and dry. No rashes noted. Psychiatric: Normal mood and affect. Behavior is normal.   ASSESSMENT AND PLAN: 81 year old male here for follow-up regarding issues as outlined:  Alcoholic liver disease / elevated liver enzymes - his serologic workup historically including viral hepatitis, iron levels, autoimmune serologies has been negative. Ultrasound shows diffuse hepatic steatosis without cirrhosis. He has significant alcohol use daily  basis, I suspect this is very likely the etiology, while drug-related liver injury from medication such as amiodarone seems less likely. I have asked him to abstain completely from alcohol although he reports he is only cut down by 50%. His AST has gone from 150 to  90 since doing this, supporting alcohol use as the likely etiology. We had a discussion about his ongoing alcohol use, his ongoing inflammation of his liver, and long-term risks for cirrhosis. I again counseled him on alcohol use and recommend he abstain completely. He agreed, denies any symptoms of withdrawal the past. Recommend we repeat liver enzymes in 3 months.  Constipation / hemorrhoids - he will try fiber supplementation such as Citrucel to see if this helps, he is not a candidate for hemorrhoidal banding given his Coumadin use.  [place] Cellar, MD [facility] Gastroenterology Pager [phone]

## 2016-11-17 DIAGNOSIS — F4323 Adjustment disorder with mixed anxiety and depressed mood: Secondary | ICD-10-CM | POA: Diagnosis not present

## 2016-11-19 ENCOUNTER — Encounter: Payer: Self-pay | Admitting: Internal Medicine

## 2016-11-19 DIAGNOSIS — E118 Type 2 diabetes mellitus with unspecified complications: Secondary | ICD-10-CM

## 2016-11-19 HISTORY — DX: Type 2 diabetes mellitus with unspecified complications: E11.8

## 2016-11-21 ENCOUNTER — Other Ambulatory Visit: Payer: Medicare Other

## 2016-11-21 ENCOUNTER — Ambulatory Visit (INDEPENDENT_AMBULATORY_CARE_PROVIDER_SITE_OTHER): Payer: Medicare Other

## 2016-11-21 DIAGNOSIS — Z23 Encounter for immunization: Secondary | ICD-10-CM

## 2016-11-29 ENCOUNTER — Ambulatory Visit (INDEPENDENT_AMBULATORY_CARE_PROVIDER_SITE_OTHER): Payer: Medicare Other | Admitting: *Deleted

## 2016-11-29 ENCOUNTER — Telehealth: Payer: Self-pay

## 2016-11-29 DIAGNOSIS — I5022 Chronic systolic (congestive) heart failure: Secondary | ICD-10-CM | POA: Diagnosis not present

## 2016-11-29 DIAGNOSIS — I495 Sick sinus syndrome: Secondary | ICD-10-CM | POA: Diagnosis not present

## 2016-11-29 DIAGNOSIS — Z95810 Presence of automatic (implantable) cardiac defibrillator: Secondary | ICD-10-CM | POA: Diagnosis not present

## 2016-11-29 NOTE — Progress Notes (Signed)
Remote ICD transmission.   

## 2016-11-29 NOTE — Telephone Encounter (Signed)
Remote ICM transmission received.  Attempted call to patient and left detailed message per DPR regarding transmission and next ICM scheduled for 01/19/2017 since he has a office defib check on 12/14/2016.  Advised to return call for any fluid symptoms or questions.

## 2016-11-29 NOTE — Progress Notes (Signed)
EPIC Encounter for ICM Monitoring  Patient Name: Corey [name] is a 81 y.o. male Date: 11/29/2016 Primary Care Physican: Janith Lima, MD Primary Cardiologist:Lori Servando Snare, NP Electrophysiologist: Allred Dry Weight:unknown Bi-V Pacing: 98%      Attempted call to patient and unable to reach.  Left detailed message regarding transmission.  Transmission reviewed.    [place]: Thoracic impedance normal.  No diuretic  Recommendations: Left voice mail with ICM number and encouraged to call if experiencing any fluid symptoms.  Follow-up plan: ICM clinic phone appointment on 01/19/2017. Office appointment scheduled 12/14/2016 with Dr. Rayann Heman.  Copy of ICM check sent to Dr. Rayann Heman.   3 month ICM trend: 11/29/2016    1 Year ICM trend:       Rosalene Billings, RN 11/29/2016 3:10 PM

## 2016-12-01 ENCOUNTER — Encounter: Payer: Self-pay | Admitting: Cardiology

## 2016-12-01 NOTE — Progress Notes (Signed)
Letter  

## 2016-12-09 ENCOUNTER — Emergency Department (HOSPITAL_COMMUNITY): Payer: Medicare Other

## 2016-12-09 ENCOUNTER — Encounter (HOSPITAL_COMMUNITY): Payer: Self-pay

## 2016-12-09 ENCOUNTER — Telehealth: Payer: Self-pay | Admitting: Internal Medicine

## 2016-12-09 ENCOUNTER — Emergency Department (HOSPITAL_COMMUNITY)
Admission: EM | Admit: 2016-12-09 | Discharge: 2016-12-09 | Disposition: A | Discharge status: Home / Self Care | Payer: Medicare Other | Attending: Emergency Medicine | Admitting: Emergency Medicine

## 2016-12-09 ENCOUNTER — Other Ambulatory Visit: Payer: Self-pay

## 2016-12-09 DIAGNOSIS — Z5321 Procedure and treatment not carried out due to patient leaving prior to being seen by health care provider: Secondary | ICD-10-CM | POA: Insufficient documentation

## 2016-12-09 DIAGNOSIS — R002 Palpitations: Secondary | ICD-10-CM | POA: Insufficient documentation

## 2016-12-09 DIAGNOSIS — R079 Chest pain, unspecified: Secondary | ICD-10-CM | POA: Diagnosis not present

## 2016-12-09 LAB — BASIC METABOLIC PANEL
Anion gap: 8 (ref 5–15)
BUN: 24 mg/dL — ABNORMAL HIGH (ref 6–20)
CO2: 22 mmol/L (ref 22–32)
CREATININE: 1.31 mg/dL — AB (ref 0.61–1.24)
Calcium: 9 mg/dL (ref 8.9–10.3)
Chloride: 107 mmol/L (ref 101–111)
GFR, EST AFRICAN AMERICAN: 58 mL/min — AB (ref >60)
GFR, EST NON AFRICAN AMERICAN: 50 mL/min — AB (ref >60)
Glucose, Bld: 137 mg/dL — ABNORMAL HIGH (ref 65–99)
POTASSIUM: 3.9 mmol/L (ref 3.5–5.1)
SODIUM: 137 mmol/L (ref 135–145)

## 2016-12-09 LAB — CBC
HEMATOCRIT: 44 % (ref 39.0–52.0)
Hemoglobin: 14.8 g/dL (ref 13.0–17.0)
MCH: 30 pg (ref 26.0–34.0)
MCHC: 33.6 g/dL (ref 30.0–36.0)
MCV: 89.1 fL (ref 78.0–100.0)
PLATELETS: 201 10*3/uL (ref 150–400)
RBC: 4.94 MIL/uL (ref 4.22–5.81)
RDW: 14 % (ref 11.5–15.5)
WBC: 6.1 10*3/uL (ref 4.0–10.5)

## 2016-12-09 LAB — I-STAT TROPONIN, ED: Troponin i, poc: 0.02 ng/mL (ref 0.00–0.08)

## 2016-12-09 NOTE — Telephone Encounter (Signed)
Pt in ED.  

## 2016-12-09 NOTE — ED Notes (Signed)
Pt stated he is not wanting to wait any longer.

## 2016-12-09 NOTE — Telephone Encounter (Signed)
New Message    FYI:  Patient calling to let you know that he is enroute to [facility] ER, he just got home from a cruise and his heart is skipping several beats and he is not feeling well

## 2016-12-09 NOTE — ED Triage Notes (Signed)
Pt endorses getting back from a cruise to [place] last night, pt states that he and his wife both got sick x 5 days while down there possibly due to "bad food" with diarrhea, and then "my heart started skipping late last night but it did some the day before yesterday and I've had this happen before" VSS here. Pt is in atrial paced rhythm.

## 2016-12-14 ENCOUNTER — Ambulatory Visit (INDEPENDENT_AMBULATORY_CARE_PROVIDER_SITE_OTHER): Payer: Medicare Other | Admitting: Internal Medicine

## 2016-12-14 ENCOUNTER — Encounter: Payer: Self-pay | Admitting: Internal Medicine

## 2016-12-14 VITALS — BP 110/72 | HR 65 | Ht 72.0 in | Wt 200.4 lb

## 2016-12-14 DIAGNOSIS — I2589 Other forms of chronic ischemic heart disease: Secondary | ICD-10-CM

## 2016-12-14 DIAGNOSIS — I428 Other cardiomyopathies: Secondary | ICD-10-CM

## 2016-12-14 DIAGNOSIS — I255 Ischemic cardiomyopathy: Secondary | ICD-10-CM | POA: Diagnosis not present

## 2016-12-14 DIAGNOSIS — I480 Paroxysmal atrial fibrillation: Secondary | ICD-10-CM

## 2016-12-14 NOTE — Patient Instructions (Addendum)
Medication Instructions:  Your physician recommends that you continue on your current medications as directed. Please refer to the Current Medication list given to you today.  -- If you need a refill on your cardiac medications before your next appointment, please call your pharmacy. --  Labwork: None ordered  Testing/Procedures: None ordered  Follow-Up:  Remote monitoring is used to monitor your ICD from home. This monitoring reduces the number of office visits required to check your device to one time per year. It allows [place] to keep an eye on the functioning of your device to ensure it is working properly. You are scheduled for a device check from home on 02/28/2017. You may send your transmission at any time that day. If you have a wireless device, the transmission will be sent automatically. After your physician reviews your transmission, you will receive a postcard with your next transmission date.   Your physician wants you to follow-up in: 1 year with Dr. Rayann Heman.    You will receive a reminder letter in the mail two months in advance. If you don't receive a letter, please call our office to schedule the follow-up appointment.  Thank you for choosing [facility]!!   Frederik Schmidt, RN [phone]  Any Other Special Instructions Will Be Listed Below (If Applicable).

## 2016-12-14 NOTE — Progress Notes (Signed)
Electrophysiology Office Note   Date:  12/14/2016   ID:  Corey [name], DOB 08-20-1935, MRN [id number]  PCP:  Janith Lima, MD    Primary Electrophysiologist: Thompson Grayer, MD    Chief Complaint  Patient presents with  . Atrial Fibrillation     History of Present Illness: Corey [name] is a 81 y.o. male who presents today for electrophysiology evaluation.   Doing reasonably well since his last visit.  Still drinking too much.  His wife and daughters are very concerned.   Today, he denies symptoms of palpitations, chest pain, shortness of breath, orthopnea, PND, lower extremity edema, claudication, dizziness, presyncope, syncope, bleeding, or neurologic sequela. The patient is tolerating medications without difficulties and is otherwise without complaint today.    Past Medical History:  Diagnosis Date  . Alcohol abuse, episodic 05/19/2008  . ALLERGIC RHINITIS 04/15/2009  . Allergy    YEAR ROUND,TAKE SHOTS  . Alzheimer's dementia    beginning with sx  . Anxiety   . Arthritis   . ASTHMA 11/16/2006   sinusitis-    hx ?yeast patch/white patch on vocal cord as per [facility] 02/25/11-   . Atrial fibrillation ([place]) 11/16/2006   remote CHF related to atrial fib with rapid ventricular response over 25 yrs per office note,  . Cancer [facility]) 2002   prostate cancer  . Chronic systolic CHF (congestive heart failure) (HCC)    echo (10/02/12): EF 40-10%, grade 1 diastolic dysfunction, trivial AI, mild MR, mild RVE  . COLONIC POLYPS, ADENOMATOUS, HX OF 02/14/2008  . CORONARY ARTERY DISEASE 11/16/2006   LHC (07/2011): LAD with luminal irregularities, proximal circumflex 50%, EF 25%  . Depression   . DIVERTICULOSIS, COLON 02/14/2008  . ESOPHAGEAL STRICTURE 01/28/2008  . GERD 02/14/2008  . Headache(784.0)   . HYPERGLYCEMIA 11/16/2006  . HYPERLIPIDEMIA 02/14/2008  . HYPERTENSION 11/16/2006  . ICD (implantable cardiac defibrillator) in place   . INSOMNIA-SLEEP DISORDER-UNSPEC 02/11/2009  .  Lesion of vocal cord    bx begine  . Myocardial infarction ([place]) 1993  . NICM (nonischemic cardiomyopathy) ([place])   . OBSTRUCTIVE SLEEP APNEA 11/10/2008  . 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 Pacemaker    St. Jude  . Pneumonia    hx of  . PROSTATE CANCER, HX OF 02/25/2000  . SLEEP APNEA 10/03/2009   LOV Dr [name] 12/12 in EPIC    Moderate per patient- settings ?6/last sleep study years ago  . Sleep apnea   . Stroke ([place])    [place]  . Symptomatic bradycardia, secondary to sinus node dysfunction 07/08/2011   s/p [facility] Scientific PPM by Dr Sallyanne Kuster, upgraded to [place] ICD 12/13 by Dr Rayann Heman (SJM)  . TRANSIENT ISCHEMIC ATTACKS, HX OF 11/16/2006  . Type 2 diabetes mellitus with complication, without long-term current use of insulin ([place]) 11/19/2016   Past Surgical History:  Procedure Laterality Date  . BACK SURGERY    . BI-VENTRICULAR IMPLANTABLE CARDIOVERTER DEFIBRILLATOR UPGRADE N/A 01/16/2012   Procedure: BI-VENTRICULAR IMPLANTABLE CARDIOVERTER DEFIBRILLATOR UPGRADE;  Surgeon: Thompson Grayer, MD;  Location: [facility] CATH LAB;  Service: Cardiovascular;  Laterality: N/A;  . CARDIAC CATHETERIZATION     several  . CARDIAC DEFIBRILLATOR PLACEMENT  01/16/12   Upgrade to a biventricular SJM ICD by DR Kharson Rasmusson  . COLONOSCOPY    . ESOPHAGOGASTRODUODENOSCOPY     with dilitation  . INSERT / REPLACE / REMOVE PACEMAKER    . KNEE ARTHROSCOPY     2 surgeries right and 1 left  . LEFT  AND RIGHT HEART CATHETERIZATION WITH CORONARY ANGIOGRAM N/A 08/18/2011   Procedure: LEFT AND RIGHT HEART CATHETERIZATION WITH CORONARY ANGIOGRAM;  Surgeon: [name], MD;  Location: [place] CATH LAB;  Service: Cardiovascular;  Laterality: N/A;  . LUMBAR LAMINECTOMY/DECOMPRESSION MICRODISCECTOMY  03/02/2011   Procedure: LUMBAR LAMINECTOMY/DECOMPRESSION MICRODISCECTOMY;  Surgeon: [name], MD;  [facility] ORS;  Service: Orthopedics;  Laterality: N/A;  Decompression Lumbar 4 - Lumbar(X-Ray)  . PACEMAKER INSERTION  06/2011    [employer] PPM implanted by Dr Sallyanne Kuster  . PERMANENT PACEMAKER INSERTION Left 07/08/2011   Procedure: PERMANENT PACEMAKER INSERTION;  Surgeon: [name], MD;  Location: [place] CATH LAB;  Service: Cardiovascular;  Laterality: Left;  . PROSTATE SURGERY  2/02   prostate cancer  . TONSILECTOMY, ADENOIDECTOMY, BILATERAL MYRINGOTOMY AND TUBES    . TONSILLECTOMY  as child  . TOTAL KNEE ARTHROPLASTY Left 06/04/2012   Procedure: TOTAL KNEE ARTHROPLASTY;  Surgeon: [name], MD;  Location[facility] ORS;  Service: Orthopedics;  Laterality: Left;  . UVULOPALATOPHARYNGOPLASTY  2013  . vocal cord lesion bx       Current Outpatient Medications  Medication Sig Dispense Refill  . albuterol (ACCUNEB) 0.63 MG/3ML nebulizer solution Inhale into the lungs.    Marland Kitchen albuterol (PROVENTIL HFA;VENTOLIN HFA) 108 (90 BASE) MCG/ACT inhaler Inhale 1-2 puffs into the lungs every 6 (six) hours as needed for shortness of breath. 1 Inhaler 11  . amiodarone (PACERONE) 200 MG tablet Take 0.5 tablets (100 mg total) by mouth daily. 45 tablet 2  . clonazePAM (KLONOPIN) 0.5 MG tablet Take 0.5 mg 2 (two) times daily as needed by mouth for anxiety.    . folic acid (FOLVITE) 1 MG tablet TAKE ONE TABLET EACH DAY 90 tablet 3  . levalbuterol (XOPENEX) 1.25 MG/3ML nebulizer solution Inhale into the lungs.    Marland Kitchen levothyroxine (SYNTHROID, LEVOTHROID) 125 MCG tablet TAKE ONE TABLET EACH DAY 90 tablet 1  . metoprolol succinate (TOPROL-XL) 50 MG 24 hr tablet TAKE ONE-HALF TABLET EVERY EVENING 15 tablet 11  . mometasone (NASONEX) 50 MCG/ACT nasal spray 1-2 puffs each nostril once or twice daily if needed 17 g 0  . NONFORMULARY OR COMPOUNDED ITEM Allergy Vaccine 1:10 Given at [facility]    . pantoprazole (PROTONIX) 40 MG tablet Take 1 tablet (40 mg total) by mouth daily. 90 tablet 3  . rosuvastatin (CRESTOR) 5 MG tablet Take 1 tablet (5 mg total) by mouth daily at 6 PM. 90 tablet 1  . sertraline (ZOLOFT) 100 MG tablet Take 1  tablet (100 mg total) by mouth daily. 90 tablet 3  . spironolactone (ALDACTONE) 25 MG tablet TAKE ONE TABLET BY MOUTH ONCE DAILY 30 tablet 8  . traMADol (ULTRAM) 50 MG tablet Take 1 tablet (50 mg total) by mouth every 6 (six) hours as needed. 75 tablet 3  . umeclidinium-vilanterol (ANORO ELLIPTA) 62.5-25 MCG/INH AEPB Inhale 1 puff into the lungs daily. 90 each 3  . warfarin (COUMADIN) 2.5 MG tablet TAKE AS DIRECTED BY COUMADIN CLINIC 35 tablet 3   No current facility-administered medications for this visit.     Allergies:   Altace [ramipril]; Hydrocodone; and Oxycodone   Social History:  The patient  reports that he quit smoking about 52 years ago. His smoking use included cigarettes. He has a 4.00 pack-year smoking history. he has never used smokeless tobacco. He reports that he drinks about 12.0 oz of alcohol per week. He reports that he does not use drugs.   Family History:  The patient's  family history includes Heart attack in his brother, father, and maternal uncle; Heart disease in his maternal uncle.    ROS:  Please see the history of present illness.   All other systems are personally reviewed and negative.    PHYSICAL EXAM: VS:  BP 110/72   Pulse 65   Ht 6' (1.829 m)   Wt 200 lb 6.4 oz (90.9 kg)   SpO2 99%   BMI 27.18 kg/m  , BMI Body mass index is 27.18 kg/m. GEN: Well nourished, well developed, in no acute distress  HEENT: normal  Neck: no JVD, carotid bruits, or masses Cardiac: RRR; no murmurs, rubs, or gallops,no edema  Respiratory:  clear to auscultation bilaterally, normal work of breathing GI: soft, nontender, nondistended, + BS MS: no deformity or atrophy  Skin: warm and dry, device pocket is well healed Neuro:  Strength and sensation are intact Psych: euthymic mood, full affect  EKG:  EKG is ordered today. The ekg ordered today is personally reviewed and shows AV paced rhythm  Device interrogation is personally reviewed today in detail.  See PaceArt for  details.   Recent Labs: 11/16/2016: ALT 81; TSH 3.63 12/09/2016: BUN 24; Creatinine, Ser 1.31; Hemoglobin 14.8; Platelets 201; Potassium 3.9; Sodium 137  personally reviewed   Lipid Panel     Component Value Date/Time   CHOL 168 11/16/2016 1216   TRIG 147.0 11/16/2016 1216   HDL 40.20 11/16/2016 1216   CHOLHDL 4 11/16/2016 1216   VLDL 29.4 11/16/2016 1216   LDLCALC 99 11/16/2016 1216   personally reviewed   Wt Readings from Last 3 Encounters:  12/14/16 200 lb 6.4 oz (90.9 kg)  11/16/16 202 lb (91.6 kg)  11/16/16 201 lb (91.2 kg)       ASSESSMENT AND PLAN:  1.  Chronic systolic dysfunction euvolemic No changes today  2. ETOH Heavy consumption per wife Cessation advised and discussed at length  3. HTN Stable No change required today  4. afib Well controlled with low dose amiodarone Recent labs are reviewed On coumadin  [place] I will see in a year    Current medicines are reviewed at length with the patient today.   The patient does not have concerns regarding his medicines.  The following changes were made today:  none  Labs/ tests ordered today include:  Orders Placed This Encounter  Procedures  . EKG 12-Lead     Signed, Thompson Grayer, MD  12/14/2016 2:51 PM     [place] [address] [phone] (office) [phone] (fax)

## 2016-12-16 ENCOUNTER — Other Ambulatory Visit: Payer: Self-pay | Admitting: Internal Medicine

## 2016-12-18 LAB — CUP PACEART REMOTE DEVICE CHECK
Battery Remaining Percentage: 35 %
Battery Voltage: 2.87 V
Brady Statistic AP VS Percent: 1 %
Brady Statistic AS VP Percent: 4.8 %
Date Time Interrogation Session: 20181106090016
HighPow Impedance: 83 Ohm
HighPow Impedance: 83 Ohm
Implantable Lead Implant Date: 20130614
Implantable Lead Implant Date: 20131223
Implantable Lead Implant Date: 20131223
Implantable Lead Location: 753858
Implantable Lead Location: 753859
Implantable Lead Model: 4136
Implantable Lead Serial Number: 29201222
Implantable Pulse Generator Implant Date: 20131223
Lead Channel Impedance Value: 1275 Ohm
Lead Channel Impedance Value: 490 Ohm
Lead Channel Pacing Threshold Amplitude: 0.75 V
Lead Channel Pacing Threshold Pulse Width: 0.5 ms
Lead Channel Sensing Intrinsic Amplitude: 12 mV
Lead Channel Setting Pacing Amplitude: 2 V
Lead Channel Setting Pacing Pulse Width: 0.5 ms
MDC IDC LEAD LOCATION: 753860
MDC IDC MSMT BATTERY REMAINING LONGEVITY: 30 mo
MDC IDC MSMT LEADCHNL LV PACING THRESHOLD AMPLITUDE: 0.75 V
MDC IDC MSMT LEADCHNL LV PACING THRESHOLD PULSEWIDTH: 0.5 ms
MDC IDC MSMT LEADCHNL RA IMPEDANCE VALUE: 560 Ohm
MDC IDC MSMT LEADCHNL RA SENSING INTR AMPL: 3.7 mV
MDC IDC MSMT LEADCHNL RV PACING THRESHOLD AMPLITUDE: 1.25 V
MDC IDC MSMT LEADCHNL RV PACING THRESHOLD PULSEWIDTH: 0.5 ms
MDC IDC SET LEADCHNL RA PACING AMPLITUDE: 2 V
MDC IDC SET LEADCHNL RV PACING AMPLITUDE: 2.5 V
MDC IDC SET LEADCHNL RV PACING PULSEWIDTH: 0.5 ms
MDC IDC SET LEADCHNL RV SENSING SENSITIVITY: 0.5 mV
MDC IDC STAT BRADY AP VP PERCENT: 93 %
MDC IDC STAT BRADY AS VS PERCENT: 1 %
MDC IDC STAT BRADY RA PERCENT PACED: 94 %
Pulse Gen Serial Number: 7057550

## 2016-12-21 ENCOUNTER — Ambulatory Visit (INDEPENDENT_AMBULATORY_CARE_PROVIDER_SITE_OTHER): Payer: Medicare Other | Admitting: *Deleted

## 2016-12-21 ENCOUNTER — Other Ambulatory Visit: Payer: Self-pay | Admitting: Internal Medicine

## 2016-12-21 DIAGNOSIS — Z5181 Encounter for therapeutic drug level monitoring: Secondary | ICD-10-CM

## 2016-12-21 DIAGNOSIS — F4323 Adjustment disorder with mixed anxiety and depressed mood: Secondary | ICD-10-CM | POA: Diagnosis not present

## 2016-12-21 DIAGNOSIS — I4891 Unspecified atrial fibrillation: Secondary | ICD-10-CM | POA: Diagnosis not present

## 2016-12-21 DIAGNOSIS — Z7901 Long term (current) use of anticoagulants: Secondary | ICD-10-CM | POA: Diagnosis not present

## 2016-12-21 LAB — POCT INR: INR: 2.2

## 2016-12-21 MED ORDER — WARFARIN SODIUM 2.5 MG PO TABS: ORAL_TABLET | ORAL | 1 refills | Status: DC

## 2016-12-21 NOTE — Patient Instructions (Signed)
Continue taking 1 tablet every day. Recheck in 6 weeks.

## 2017-01-06 LAB — CUP PACEART INCLINIC DEVICE CHECK
Battery Remaining Longevity: 28 mo
Brady Statistic RA Percent Paced: 93 %
Brady Statistic RV Percent Paced: 98 %
HIGH POWER IMPEDANCE MEASURED VALUE: 74.25 Ohm
Implantable Lead Location: 753859
Implantable Lead Location: 753860
Implantable Lead Model: 7122
Lead Channel Impedance Value: 1175 Ohm
Lead Channel Impedance Value: 437.5 Ohm
Lead Channel Impedance Value: 525 Ohm
Lead Channel Pacing Threshold Amplitude: 0.75 V
Lead Channel Pacing Threshold Amplitude: 1 V
Lead Channel Pacing Threshold Amplitude: 1.25 V
Lead Channel Pacing Threshold Pulse Width: 0.5 ms
Lead Channel Pacing Threshold Pulse Width: 0.5 ms
Lead Channel Setting Pacing Amplitude: 2 V
Lead Channel Setting Pacing Amplitude: 2.5 V
Lead Channel Setting Pacing Pulse Width: 0.5 ms
Lead Channel Setting Sensing Sensitivity: 0.5 mV
MDC IDC LEAD IMPLANT DT: 20130614
MDC IDC LEAD IMPLANT DT: 20131223
MDC IDC LEAD IMPLANT DT: 20131223
MDC IDC LEAD LOCATION: 753858
MDC IDC LEAD SERIAL: 29201222
MDC IDC MSMT LEADCHNL LV PACING THRESHOLD PULSEWIDTH: 0.5 ms
MDC IDC MSMT LEADCHNL RA SENSING INTR AMPL: 3.6 mV
MDC IDC MSMT LEADCHNL RV SENSING INTR AMPL: 10.8 mV
MDC IDC PG IMPLANT DT: 20131223
MDC IDC PG SERIAL: 7057550
MDC IDC SESS DTM: 20181121203901
MDC IDC SET LEADCHNL LV PACING AMPLITUDE: 2 V
MDC IDC SET LEADCHNL RV PACING PULSEWIDTH: 0.5 ms

## 2017-01-20 NOTE — Progress Notes (Signed)
No ICM remote transmission received for 01/19/2017 and next ICM transmission scheduled for 02/10/2017.

## 2017-02-02 ENCOUNTER — Ambulatory Visit (INDEPENDENT_AMBULATORY_CARE_PROVIDER_SITE_OTHER): Payer: Medicare Other | Admitting: *Deleted

## 2017-02-02 DIAGNOSIS — Z7901 Long term (current) use of anticoagulants: Secondary | ICD-10-CM

## 2017-02-02 DIAGNOSIS — I4891 Unspecified atrial fibrillation: Secondary | ICD-10-CM

## 2017-02-02 DIAGNOSIS — Z5181 Encounter for therapeutic drug level monitoring: Secondary | ICD-10-CM

## 2017-02-02 LAB — POCT INR: INR: 1.8

## 2017-02-02 NOTE — Patient Instructions (Signed)
Description   Today take 1.5 tablets then continue taking 1 tablet every day. Recheck in 4 weeks.

## 2017-02-03 DIAGNOSIS — Z85828 Personal history of other malignant neoplasm of skin: Secondary | ICD-10-CM | POA: Diagnosis not present

## 2017-02-03 DIAGNOSIS — D485 Neoplasm of uncertain behavior of skin: Secondary | ICD-10-CM | POA: Diagnosis not present

## 2017-02-10 ENCOUNTER — Telehealth: Payer: Self-pay | Admitting: Cardiology

## 2017-02-10 ENCOUNTER — Ambulatory Visit (INDEPENDENT_AMBULATORY_CARE_PROVIDER_SITE_OTHER): Payer: Medicare Other

## 2017-02-10 DIAGNOSIS — I5022 Chronic systolic (congestive) heart failure: Secondary | ICD-10-CM

## 2017-02-10 DIAGNOSIS — Z95810 Presence of automatic (implantable) cardiac defibrillator: Secondary | ICD-10-CM

## 2017-02-10 NOTE — Telephone Encounter (Signed)
Confirmed remote transmission w/ pt wife. Patient is in [place] and will not be able to send remote transmission until Monday 02-13-2017.

## 2017-02-13 ENCOUNTER — Other Ambulatory Visit: Payer: Self-pay | Admitting: Internal Medicine

## 2017-02-13 ENCOUNTER — Other Ambulatory Visit: Payer: Self-pay

## 2017-02-13 NOTE — Progress Notes (Signed)
EPIC Encounter for ICM Monitoring  Patient Name: Corey [name] is a 82 y.o. male Date: 02/13/2017 Primary Care Physican: Janith Lima, MD Primary Cardiologist:Lori Servando Snare, NP Electrophysiologist: Allred Dry Weight:unknown Bi-V Pacing: 98%      Spoke with wife due to patient was not home.  She said patient has been sleeping too much.  He sleeps at least 14 hours a day and she has difficulty getting him out of bed before noon.     Thoracic impedance normal but was abnormal suggesting fluid accumulation from 01/08/17 to 01/29/17 (total of 21 days).  Takes Spironolactone 25 mg 1 tablet daily  Recommendations:  Advised her to have patient weigh daily and record, limit salt intake to 2000 mg daily and fluid to 64 oz a day.  She is concerned about him sleeping so much.   Follow-up plan: ICM clinic phone appointment on 03/16/2017. Advised he is due to make an appointment with Truitt Merle, NP (last appointment was 02/2016) for check up since he is sleeping so much.   Copy of ICM check sent to Dr. Rayann Heman.   3 month ICM trend: 02/13/2017    1 Year ICM trend:       Rosalene Billings, RN 02/13/2017 11:38 AM

## 2017-02-15 ENCOUNTER — Telehealth: Payer: Self-pay | Admitting: Nurse Practitioner

## 2017-02-15 NOTE — Telephone Encounter (Signed)
Spoke w/ pt and informed him that his remote transmission was received on 02-11-2017 and the next scheduled would be on 02-28-17. Pt stated that he would not be home then. Reschedule that appt to 03-14-17.

## 2017-02-15 NOTE — Telephone Encounter (Signed)
Mr.Maita is returning a call . Thanks

## 2017-02-16 DIAGNOSIS — Z85828 Personal history of other malignant neoplasm of skin: Secondary | ICD-10-CM | POA: Diagnosis not present

## 2017-02-16 DIAGNOSIS — D0439 Carcinoma in situ of skin of other parts of face: Secondary | ICD-10-CM | POA: Diagnosis not present

## 2017-02-20 ENCOUNTER — Other Ambulatory Visit (INDEPENDENT_AMBULATORY_CARE_PROVIDER_SITE_OTHER): Payer: Medicare Other

## 2017-02-20 DIAGNOSIS — R945 Abnormal results of liver function studies: Secondary | ICD-10-CM

## 2017-02-20 DIAGNOSIS — F4323 Adjustment disorder with mixed anxiety and depressed mood: Secondary | ICD-10-CM | POA: Diagnosis not present

## 2017-02-20 DIAGNOSIS — R7989 Other specified abnormal findings of blood chemistry: Secondary | ICD-10-CM

## 2017-02-20 LAB — HEPATIC FUNCTION PANEL
ALT: 67 U/L — ABNORMAL HIGH (ref 0–53)
AST: 106 U/L — ABNORMAL HIGH (ref 0–37)
Albumin: 4 g/dL (ref 3.5–5.2)
Alkaline Phosphatase: 107 U/L (ref 39–117)
Bilirubin, Direct: 0.1 mg/dL (ref 0.0–0.3)
TOTAL PROTEIN: 7.6 g/dL (ref 6.0–8.3)
Total Bilirubin: 0.6 mg/dL (ref 0.2–1.2)

## 2017-02-22 ENCOUNTER — Other Ambulatory Visit: Payer: Self-pay | Admitting: Internal Medicine

## 2017-03-13 ENCOUNTER — Ambulatory Visit (INDEPENDENT_AMBULATORY_CARE_PROVIDER_SITE_OTHER): Payer: Medicare Other | Admitting: *Deleted

## 2017-03-13 DIAGNOSIS — I428 Other cardiomyopathies: Secondary | ICD-10-CM

## 2017-03-13 NOTE — Progress Notes (Signed)
Remote ICD transmission.   

## 2017-03-14 LAB — CUP PACEART REMOTE DEVICE CHECK
Battery Remaining Percentage: 33 %
Battery Voltage: 2.87 V
Brady Statistic AP VP Percent: 92 %
Brady Statistic AP VS Percent: 1.2 %
Brady Statistic AS VP Percent: 6.2 %
Date Time Interrogation Session: 20190218090019
HighPow Impedance: 80 Ohm
HighPow Impedance: 80 Ohm
Implantable Lead Implant Date: 20130614
Implantable Lead Implant Date: 20131223
Implantable Lead Location: 753858
Implantable Lead Location: 753860
Implantable Lead Model: 4136
Lead Channel Impedance Value: 1175 Ohm
Lead Channel Impedance Value: 560 Ohm
Lead Channel Pacing Threshold Amplitude: 1 V
Lead Channel Pacing Threshold Pulse Width: 0.5 ms
Lead Channel Pacing Threshold Pulse Width: 0.5 ms
Lead Channel Sensing Intrinsic Amplitude: 11.9 mV
Lead Channel Setting Pacing Amplitude: 2 V
Lead Channel Setting Pacing Amplitude: 2.5 V
Lead Channel Setting Pacing Pulse Width: 0.5 ms
Lead Channel Setting Pacing Pulse Width: 0.5 ms
MDC IDC LEAD IMPLANT DT: 20131223
MDC IDC LEAD LOCATION: 753859
MDC IDC LEAD SERIAL: 29201222
MDC IDC MSMT BATTERY REMAINING LONGEVITY: 28 mo
MDC IDC MSMT LEADCHNL LV PACING THRESHOLD AMPLITUDE: 0.75 V
MDC IDC MSMT LEADCHNL RA PACING THRESHOLD PULSEWIDTH: 0.5 ms
MDC IDC MSMT LEADCHNL RA SENSING INTR AMPL: 1.3 mV
MDC IDC MSMT LEADCHNL RV IMPEDANCE VALUE: 480 Ohm
MDC IDC MSMT LEADCHNL RV PACING THRESHOLD AMPLITUDE: 1.25 V
MDC IDC PG IMPLANT DT: 20131223
MDC IDC SET LEADCHNL LV PACING AMPLITUDE: 2 V
MDC IDC SET LEADCHNL RV SENSING SENSITIVITY: 0.5 mV
MDC IDC STAT BRADY AS VS PERCENT: 1 %
MDC IDC STAT BRADY RA PERCENT PACED: 92 %
Pulse Gen Serial Number: 7057550

## 2017-03-15 ENCOUNTER — Encounter: Payer: Self-pay | Admitting: Cardiology

## 2017-03-15 ENCOUNTER — Other Ambulatory Visit: Payer: Self-pay | Admitting: Gastroenterology

## 2017-03-16 ENCOUNTER — Ambulatory Visit (INDEPENDENT_AMBULATORY_CARE_PROVIDER_SITE_OTHER): Payer: Medicare Other

## 2017-03-16 DIAGNOSIS — Z95810 Presence of automatic (implantable) cardiac defibrillator: Secondary | ICD-10-CM

## 2017-03-16 DIAGNOSIS — I5022 Chronic systolic (congestive) heart failure: Secondary | ICD-10-CM | POA: Diagnosis not present

## 2017-03-17 NOTE — Progress Notes (Signed)
EPIC Encounter for ICM Monitoring  Patient Name: Corey [name] is a 82 y.o. male Date: 03/17/2017 Primary Care Physican: Janith Lima, MD Primary Cardiologist:Lori Servando Snare, NP Electrophysiologist: Allred Dry Weight:unknown Bi-V Pacing: 98%      Spoke with wife.  Heart Failure questions reviewed, pt asymptomatic.  She said he sleeps a lot and she will discuss with PCP on 03/22/2017 appointment.   Thoracic impedance normal.  Spironolactone 25 mg 1 tablet daily  Recommendations: No changes.  Encouraged to call for fluid symptoms.  Follow-up plan: ICM clinic phone appointment on 04/17/2017.   Copy of ICM check sent to Dr. Rayann Heman.   3 month ICM trend: 03/16/2017    1 Year ICM trend:       Rosalene Billings, RN 03/17/2017 2:15 PM

## 2017-03-22 ENCOUNTER — Encounter: Payer: Self-pay | Admitting: Internal Medicine

## 2017-03-22 ENCOUNTER — Other Ambulatory Visit (INDEPENDENT_AMBULATORY_CARE_PROVIDER_SITE_OTHER): Payer: Medicare Other

## 2017-03-22 ENCOUNTER — Ambulatory Visit (INDEPENDENT_AMBULATORY_CARE_PROVIDER_SITE_OTHER): Payer: Medicare Other | Admitting: Internal Medicine

## 2017-03-22 VITALS — BP 120/60 | HR 69 | Temp 98.6°F | Ht 72.0 in | Wt 202.0 lb

## 2017-03-22 DIAGNOSIS — R270 Ataxia, unspecified: Secondary | ICD-10-CM | POA: Diagnosis not present

## 2017-03-22 DIAGNOSIS — E032 Hypothyroidism due to medicaments and other exogenous substances: Secondary | ICD-10-CM

## 2017-03-22 DIAGNOSIS — I10 Essential (primary) hypertension: Secondary | ICD-10-CM

## 2017-03-22 DIAGNOSIS — E118 Type 2 diabetes mellitus with unspecified complications: Secondary | ICD-10-CM

## 2017-03-22 DIAGNOSIS — D510 Vitamin B12 deficiency anemia due to intrinsic factor deficiency: Secondary | ICD-10-CM | POA: Diagnosis not present

## 2017-03-22 LAB — MICROALBUMIN / CREATININE URINE RATIO
Creatinine,U: 392.2 mg/dL
Microalb Creat Ratio: 2.7 mg/g (ref 0.0–30.0)
Microalb, Ur: 10.7 mg/dL — ABNORMAL HIGH (ref 0.0–1.9)

## 2017-03-22 LAB — COMPREHENSIVE METABOLIC PANEL
ALK PHOS: 130 U/L — AB (ref 39–117)
ALT: 96 U/L — ABNORMAL HIGH (ref 0–53)
AST: 205 U/L — ABNORMAL HIGH (ref 0–37)
Albumin: 4.1 g/dL (ref 3.5–5.2)
BUN: 15 mg/dL (ref 6–23)
CO2: 30 mEq/L (ref 19–32)
Calcium: 9.7 mg/dL (ref 8.4–10.5)
Chloride: 99 mEq/L (ref 96–112)
Creatinine, Ser: 1.23 mg/dL (ref 0.40–1.50)
GFR: 60.29 mL/min (ref >60.00)
GLUCOSE: 144 mg/dL — AB (ref 70–99)
POTASSIUM: 4.4 meq/L (ref 3.5–5.1)
Sodium: 136 mEq/L (ref 135–145)
TOTAL PROTEIN: 8 g/dL (ref 6.0–8.3)
Total Bilirubin: 0.8 mg/dL (ref 0.2–1.2)

## 2017-03-22 LAB — CBC WITH DIFFERENTIAL/PLATELET
BASOS PCT: 1.4 % (ref 0.0–3.0)
Basophils Absolute: 0.1 10*3/uL (ref 0.0–0.1)
EOS PCT: 0.9 % (ref 0.0–5.0)
Eosinophils Absolute: 0.1 10*3/uL (ref 0.0–0.7)
HCT: 40.9 % (ref 39.0–52.0)
Hemoglobin: 13.9 g/dL (ref 13.0–17.0)
LYMPHS ABS: 1.1 10*3/uL (ref 0.7–4.0)
Lymphocytes Relative: 17.9 % (ref 12.0–46.0)
MCHC: 34.1 g/dL (ref 30.0–36.0)
MCV: 88.4 fl (ref 78.0–100.0)
MONOS PCT: 8.5 % (ref 3.0–12.0)
Monocytes Absolute: 0.5 10*3/uL (ref 0.1–1.0)
NEUTROS ABS: 4.5 10*3/uL (ref 1.4–7.7)
NEUTROS PCT: 71.3 % (ref 43.0–77.0)
Platelets: 166 10*3/uL (ref 150.0–400.0)
RBC: 4.63 Mil/uL (ref 4.22–5.81)
RDW: 14.7 % (ref 11.5–15.5)
WBC: 6.3 10*3/uL (ref 4.0–10.5)

## 2017-03-22 LAB — POCT GLYCOSYLATED HEMOGLOBIN (HGB A1C): Hemoglobin A1C: 8.2

## 2017-03-22 LAB — POCT GLUCOSE (DEVICE FOR HOME USE): GLUCOSE FASTING, POC: 164 mg/dL — AB (ref 70–99)

## 2017-03-22 LAB — FOLATE: Folate: >23.6 ng/mL (ref >5.9)

## 2017-03-22 LAB — VITAMIN B12: Vitamin B-12: 463 pg/mL (ref 211–911)

## 2017-03-22 MED ORDER — EMPAGLIFLOZIN-METFORMIN HCL 5-1000 MG PO TABS: 1.0000 | ORAL_TABLET | Freq: Two times a day (BID) | ORAL | 1 refills | Status: DC

## 2017-03-22 NOTE — Progress Notes (Signed)
Subjective:  Patient ID: Corey [name], male    DOB: 1935/11/17  Age: 82 y.o. MRN: [id number]  CC: Diabetes   HPI [name] presents for f/up - His wife is with him today and she tells me that he is not doing well.  He continues to drink 3 large alcoholic servings per day.  He complains of feeling off balance and having several recent falls without any subsequent injury.  He complains of fatigue and loss of appetite.  He tells me that he feels achy all over.  He and his wife both agree that his alcohol intake is a problem but he is not willing to quit drinking at this time.  Outpatient Medications Prior to Visit  Medication Sig Dispense Refill  . albuterol (ACCUNEB) 0.63 MG/3ML nebulizer solution Inhale into the lungs.    Marland Kitchen albuterol (PROVENTIL HFA;VENTOLIN HFA) 108 (90 BASE) MCG/ACT inhaler Inhale 1-2 puffs into the lungs every 6 (six) hours as needed for shortness of breath. (Patient not taking: Reported on 03/25/2017) 1 Inhaler 11  . amiodarone (PACERONE) 200 MG tablet TAKE ONE-HALF TABLET DAILY 45 tablet 3  . folic acid (FOLVITE) 1 MG tablet TAKE ONE TABLET EVERY DAY 90 tablet 1  . levalbuterol (XOPENEX) 1.25 MG/3ML nebulizer solution Inhale into the lungs.    Marland Kitchen levothyroxine (SYNTHROID, LEVOTHROID) 125 MCG tablet TAKE ONE TABLET EACH DAY 90 tablet 1  . metoprolol succinate (TOPROL-XL) 50 MG 24 hr tablet TAKE ONE-HALF TABLET EVERY EVENING 15 tablet 11  . mometasone (NASONEX) 50 MCG/ACT nasal spray 1-2 puffs each nostril once or twice daily if needed 17 g 0  . NONFORMULARY OR COMPOUNDED ITEM Allergy Vaccine 1:10 Given at [facility]    . pantoprazole (PROTONIX) 40 MG tablet TAKE ONE TABLET BY MOUTH ONCE DAILY 90 tablet 0  . rosuvastatin (CRESTOR) 5 MG tablet Take 1 tablet (5 mg total) by mouth daily at 6 PM. 90 tablet 1  . sertraline (ZOLOFT) 100 MG tablet Take 1 tablet (100 mg total) by mouth daily. 90 tablet 3  . spironolactone (ALDACTONE) 25 MG tablet TAKE ONE TABLET  BY MOUTH ONCE DAILY 30 tablet 8  . umeclidinium-vilanterol (ANORO ELLIPTA) 62.5-25 MCG/INH AEPB Inhale 1 puff into the lungs daily. 90 each 3  . warfarin (COUMADIN) 2.5 MG tablet TAKE AS DIRECTED BY COUMADIN CLINIC 35 tablet 1  . clonazePAM (KLONOPIN) 0.5 MG tablet Take 0.5 mg 2 (two) times daily as needed by mouth for anxiety.    . traMADol (ULTRAM) 50 MG tablet Take 1 tablet (50 mg total) by mouth every 6 (six) hours as needed. 75 tablet 3   No facility-administered medications prior to visit.     ROS Review of Systems  Constitutional: Positive for fatigue. Negative for appetite change, chills, diaphoresis and unexpected weight change.  HENT: Negative.  Negative for sore throat and trouble swallowing.   Eyes: Negative.  Negative for visual disturbance.  Respiratory: Negative for cough, chest tightness, shortness of breath and wheezing.   Cardiovascular: Negative for chest pain, palpitations and leg swelling.  Gastrointestinal: Negative for abdominal pain, constipation, diarrhea, nausea and vomiting.  Endocrine: Positive for polydipsia. Negative for polyphagia and polyuria.  Genitourinary: Negative.  Negative for difficulty urinating.  Musculoskeletal: Positive for arthralgias, gait problem and myalgias. Negative for back pain, joint swelling and neck pain.  Skin: Negative.  Negative for pallor and rash.  Allergic/Immunologic: Negative.   Neurological: Negative for dizziness, weakness, light-headedness, numbness and headaches.  Hematological: Negative for adenopathy.  Does not bruise/bleed easily.  Psychiatric/Behavioral: Positive for confusion, decreased concentration, dysphoric mood and sleep disturbance. Negative for agitation, behavioral problems, hallucinations, self-injury and suicidal ideas. The patient is nervous/anxious. The patient is not hyperactive.     Objective:  BP 120/60 (BP Location: Left Arm, Patient Position: Sitting, Cuff Size: Large)   Pulse 69   Temp 98.6 F (37  C) (Oral)   Ht 6' (1.829 m)   Wt 202 lb (91.6 kg)   SpO2 97%   BMI 27.40 kg/m   BP Readings from Last 3 Encounters:  03/25/17 124/72  03/22/17 120/60  12/14/16 110/72    Wt Readings from Last 3 Encounters:  03/25/17 199 lb (90.3 kg)  03/22/17 202 lb (91.6 kg)  12/14/16 200 lb 6.4 oz (90.9 kg)    Physical Exam  Constitutional: No distress.  HENT:  Mouth/Throat: Oropharynx is clear and moist. No oropharyngeal exudate.  Eyes: Conjunctivae are normal. Left eye exhibits no discharge. No scleral icterus.  Neck: Normal range of motion. Neck supple. No JVD present. No thyromegaly present.  Cardiovascular: Normal rate, regular rhythm and normal heart sounds. Exam reveals no gallop.  No murmur heard. Pulmonary/Chest: Effort normal and breath sounds normal. No respiratory distress. He has no wheezes. He has no rales.  Abdominal: Soft. Bowel sounds are normal. He exhibits no distension and no mass. There is no tenderness. There is no guarding.  Musculoskeletal: Normal range of motion. He exhibits no edema, tenderness or deformity.  Lymphadenopathy:    He has no cervical adenopathy.  Neurological: He displays no atrophy, no tremor and normal reflexes. No cranial nerve deficit or sensory deficit. He exhibits normal muscle tone. He displays no seizure activity. Coordination and gait abnormal.  Skin: Skin is warm and dry. No rash noted. He is not diaphoretic. No erythema. No pallor.  Psychiatric: Judgment normal. His mood appears anxious. His speech is tangential. His speech is not rapid and/or pressured and not slurred. He is slowed. He is not agitated, not aggressive and not withdrawn. He does not express impulsivity or inappropriate judgment. He exhibits a depressed mood. He expresses no homicidal and no suicidal ideation. He exhibits abnormal remote memory. He exhibits normal recent memory. He is inattentive.  Vitals reviewed.   Lab Results  Component Value Date   WBC 6.3 03/22/2017    HGB 13.9 03/22/2017   HCT 40.9 03/22/2017   PLT 166.0 03/22/2017   GLUCOSE 144 (H) 03/22/2017   CHOL 168 11/16/2016   TRIG 147.0 11/16/2016   HDL 40.20 11/16/2016   LDLCALC 99 11/16/2016   ALT 96 (H) 03/22/2017   AST 205 (H) 03/22/2017   NA 136 03/22/2017   K 4.4 03/22/2017   CL 99 03/22/2017   CREATININE 1.23 03/22/2017   BUN 15 03/22/2017   CO2 30 03/22/2017   TSH 4.15 03/22/2017   PSA <0.015 05/26/2016   INR 1.8 02/02/2017   HGBA1C 8.2 03/22/2017   MICROALBUR 10.7 (H) 03/22/2017    Dg Chest 2 View  Result Date: 12/09/2016 CLINICAL DATA:  Palpitations, chest pain for 1 day EXAM: CHEST  2 VIEW COMPARISON:  03/30/2016 FINDINGS: There is no focal parenchymal opacity. There is no pleural effusion or pneumothorax. The heart and mediastinal contours are unremarkable. There is a 3 lead cardiac pacemaker. There is no acute osseous abnormality. There is bilateral arthropathy of the acromioclavicular joints. IMPRESSION: No active cardiopulmonary disease. Electronically Signed   By: Kathreen Devoid   On: 12/09/2016 12:40    Assessment &  Plan:   Othello was seen today for diabetes.  Diagnoses and all orders for this visit:  Type 2 diabetes mellitus with complication, without long-term current use of insulin ([place])- His A1c is up to 8.2 and he is symptomatic.  Will start the combination of metformin plus an SGL T-2 inhibitor. -     POCT glycosylated hemoglobin (Hb A1C) -     POCT Glucose (Device for Home Use) -     Empagliflozin-metFORMIN HCl (SYNJARDY) 05-998 MG TABS; Take 1 tablet by mouth 2 (two) times daily. -     Comprehensive metabolic panel; Future -     Microalbumin / creatinine urine ratio; Future -     Ambulatory referral to Ophthalmology -     Ambulatory referral to Physical Therapy -     Consult to [facility] -     Referral to Nutrition and Diabetes Services  Hypothyroidism due to medication- His TSH is in an acceptable range.  Will continue the current dose of  levothyroxine. -     Thyroid Panel With TSH; Future   Essential hypertension- His blood pressure is well controlled.  Electrolytes and renal function are normal. -     Comprehensive metabolic panel; Future  Vitamin B12 deficiency anemia due to intrinsic factor deficiency-his H&H are normal now.  Will continue B12 replacement therapy.  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  He is not willing to quit drinking.  Will see if physical therapy can help improve his strength and balance to prevent falls.  His thiamine level was borderline low a year and a half ago at 9.  His symptoms may also be related to thiamine deficiency so I have asked him to start a thiamine supplement as well. -     Ambulatory referral to Physical Therapy -     Vitamin B12; Future -     Folate; Future -     Vitamin B1; Future   I have discontinued Pierce Crane. Karner's traMADol and clonazePAM. I am also having him start on Empagliflozin-metFORMIN HCl. Additionally, I am having him maintain his albuterol, NONFORMULARY OR COMPOUNDED ITEM, levalbuterol, albuterol, umeclidinium-vilanterol, mometasone, metoprolol succinate, sertraline, spironolactone, levothyroxine, rosuvastatin, folic acid, amiodarone, warfarin, and pantoprazole.  Meds ordered this encounter  Medications  . Empagliflozin-metFORMIN HCl (SYNJARDY) 05-998 MG TABS    Sig: Take 1 tablet by mouth 2 (two) times daily.    Dispense:  180 tablet    Refill:  1     Follow-up: Return in about 3 months (around 06/19/2017).  Scarlette Calico, MD

## 2017-03-22 NOTE — Patient Instructions (Signed)

## 2017-03-23 ENCOUNTER — Encounter: Payer: Self-pay | Admitting: Rehabilitation

## 2017-03-23 ENCOUNTER — Other Ambulatory Visit: Payer: Self-pay

## 2017-03-23 ENCOUNTER — Telehealth: Payer: Self-pay | Admitting: Internal Medicine

## 2017-03-23 ENCOUNTER — Telehealth: Payer: Self-pay | Admitting: Rehabilitation

## 2017-03-23 ENCOUNTER — Ambulatory Visit: Payer: Medicare Other | Attending: Internal Medicine | Admitting: Rehabilitation

## 2017-03-23 DIAGNOSIS — R293 Abnormal posture: Secondary | ICD-10-CM | POA: Diagnosis not present

## 2017-03-23 DIAGNOSIS — M6281 Muscle weakness (generalized): Secondary | ICD-10-CM | POA: Insufficient documentation

## 2017-03-23 DIAGNOSIS — R2681 Unsteadiness on feet: Secondary | ICD-10-CM | POA: Diagnosis not present

## 2017-03-23 DIAGNOSIS — R2689 Other abnormalities of gait and mobility: Secondary | ICD-10-CM

## 2017-03-23 DIAGNOSIS — R296 Repeated falls: Secondary | ICD-10-CM | POA: Insufficient documentation

## 2017-03-23 NOTE — Telephone Encounter (Signed)
Referral has been entered.

## 2017-03-23 NOTE — Telephone Encounter (Signed)
Dr. Ronnald Ramp,   I am seeing Mr. Corey [name] at [facility] neuro for PT.  Upon evaluation, pt and wife endorse his ETOH abuse, memory loss, depression, and the fact that he is simply not eating for long periods of time.  He is also sleeping much of the day.  Wife verbalized that they are seeing counseling for ETOH abuse, however I am unsure of this person's credentials?  I'm not sure if he would benefit from seeing a more specialized psychologist but the wife seems very overwhelmed at this point in time.  I think we can make some gains with balance and mobility but I fear that we will not make long term changes due to these other issues.  Even during our evaluation, we were very limited with what we could do because he became nauseated.  I feel that he was likely hypoglycemic as wife reports he had not eaten in over 12 hours.  I just wanted to let you know and see what kind of resources we could offer them.    Thanks,  Cameron Sprang, PT, MPT [facility] [address] [place], [place], [address] Phone: [phone]   Fax:  [phone] 03/23/17, 8:34 PM

## 2017-03-23 NOTE — Telephone Encounter (Signed)
Copied from [place]. Topic: Referral - Request >> Mar 23, 2017 10:42 AM Aurelio Brash B wrote: Reason for CRM: Pt's wife is asking for a referral for the pt to a dietician because he was diagnosed with type 2 DM yesterday

## 2017-03-23 NOTE — Therapy (Signed)
[facility] [address] [place], [place], [address] Phone: [phone]   Fax:  [phone]  Physical Therapy Evaluation  Patient Details  Name: Corey [name] MRN: [id number] Date of Birth: 1935/07/23 Referring Provider: Scarlette Calico, MD   Encounter Date: 03/23/2017  PT End of Session - 03/23/17 2004    Visit Number  1    Number of Visits  17    Date for PT Re-Evaluation  05/22/17    Authorization Type  Medicare    PT Start Time  1532    PT Stop Time  1619    PT Time Calculation (min)  47 min    Activity Tolerance  Treatment limited secondary to medical complications (Comment)    Behavior During Therapy  [facility] for tasks assessed/performed       Past Medical History:  Diagnosis Date  . Alcohol abuse, episodic 05/19/2008  . ALLERGIC RHINITIS 04/15/2009  . Allergy    YEAR ROUND,TAKE SHOTS  . Alzheimer's dementia    beginning with sx  . Anxiety   . Arthritis   . ASTHMA 11/16/2006   sinusitis-    hx ?yeast patch/white patch on vocal cord as per [facility] 02/25/11-   . Atrial fibrillation ([place]) 11/16/2006   remote CHF related to atrial fib with rapid ventricular response over 25 yrs per office note,  . Cancer [facility]) 2002   prostate cancer  . Chronic systolic CHF (congestive heart failure) (HCC)    echo (10/02/12): EF 69-62%, grade 1 diastolic dysfunction, trivial AI, mild MR, mild RVE  . COLONIC POLYPS, ADENOMATOUS, HX OF 02/14/2008  . CORONARY ARTERY DISEASE 11/16/2006   LHC (07/2011): LAD with luminal irregularities, proximal circumflex 50%, EF 25%  . Depression   . DIVERTICULOSIS, COLON 02/14/2008  . ESOPHAGEAL STRICTURE 01/28/2008  . GERD 02/14/2008  . Headache(784.0)   . HYPERGLYCEMIA 11/16/2006  . HYPERLIPIDEMIA 02/14/2008  . HYPERTENSION 11/16/2006  . ICD (implantable cardiac defibrillator) in place   . INSOMNIA-SLEEP DISORDER-UNSPEC 02/11/2009  . Lesion of vocal cord    bx begine  . Myocardial infarction ([place])  1993  . NICM (nonischemic cardiomyopathy) ([place])   . OBSTRUCTIVE SLEEP APNEA 11/10/2008  . 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 Pacemaker    St. Jude  . Pneumonia    hx of  . PROSTATE CANCER, HX OF 02/25/2000  . SLEEP APNEA 10/03/2009   LOV Dr [name] 12/12 in EPIC    Moderate per patient- settings ?6/last sleep study years ago  . Sleep apnea   . Stroke ([place])    Tia  . Symptomatic bradycardia, secondary to sinus node dysfunction 07/08/2011   s/p [facility] Scientific PPM by Dr Sallyanne Kuster, upgraded to [employer] ICD 12/13 by Dr Rayann Heman (SJM)  . TRANSIENT ISCHEMIC ATTACKS, HX OF 11/16/2006  . Type 2 diabetes mellitus with complication, without long-term current use of insulin ([place]) 11/19/2016    Past Surgical History:  Procedure Laterality Date  . BACK SURGERY    . BI-VENTRICULAR IMPLANTABLE CARDIOVERTER DEFIBRILLATOR UPGRADE N/A 01/16/2012   Procedure: BI-VENTRICULAR IMPLANTABLE CARDIOVERTER DEFIBRILLATOR UPGRADE;  Surgeon: Thompson Grayer, MD;  Location: [facility] CATH LAB;  Service: Cardiovascular;  Laterality: N/A;  . CARDIAC CATHETERIZATION     several  . CARDIAC DEFIBRILLATOR PLACEMENT  01/16/12   Upgrade to a biventricular SJM ICD by DR Allred  . COLONOSCOPY    . ESOPHAGOGASTRODUODENOSCOPY     with dilitation  . INSERT / REPLACE / REMOVE PACEMAKER    . KNEE ARTHROSCOPY  2 surgeries right and 1 left  . LEFT AND RIGHT HEART CATHETERIZATION WITH CORONARY ANGIOGRAM N/A 08/18/2011   Procedure: LEFT AND RIGHT HEART CATHETERIZATION WITH CORONARY ANGIOGRAM;  Surgeon: [name], MD;  Location: [facility] CATH LAB;  Service: Cardiovascular;  Laterality: N/A;  . LUMBAR LAMINECTOMY/DECOMPRESSION MICRODISCECTOMY  03/02/2011   Procedure: LUMBAR LAMINECTOMY/DECOMPRESSION MICRODISCECTOMY;  Surgeon: [name], MD;  Location: [facility] ORS;  Service: Orthopedics;  Laterality: N/A;  Decompression Lumbar 4 - Lumbar(X-Ray)  . PACEMAKER INSERTION  06/2011   [employer] PPM implanted by Dr Sallyanne Kuster  . PERMANENT  PACEMAKER INSERTION Left 07/08/2011   Procedure: PERMANENT PACEMAKER INSERTION;  Surgeon: [name], MD;  Location: [facility] CATH LAB;  Service: Cardiovascular;  Laterality: Left;  . PROSTATE SURGERY  2/02   prostate cancer  . TONSILECTOMY, ADENOIDECTOMY, BILATERAL MYRINGOTOMY AND TUBES    . TONSILLECTOMY  as child  . TOTAL KNEE ARTHROPLASTY Left 06/04/2012   Procedure: TOTAL KNEE ARTHROPLASTY;  Surgeon: [name], MD;  Location: [facility] ORS;  Service: Orthopedics;  Laterality: Left;  . UVULOPALATOPHARYNGOPLASTY  2013  . vocal cord lesion bx      There were no vitals filed for this visit.   Subjective Assessment - 03/23/17 1539    Subjective  "I'm having a hard time getting around.  Per wife, patient has been falling a lot and she mentioned this to MD who referred him to therapy.     Patient is accompained by:  Family member Corey [name]    Pertinent History  Pt goes by "[employer] hold activities;Walking    Patient Stated Goals  "I want to be able to walk a straight line and not fall down.  I want to stand up straight."     Currently in Pain?  Yes    Pain Score  5     Pain Location  Back    Pain Orientation  Lower    Pain Descriptors / Indicators  Aching    Pain Type  Chronic pain    Pain Onset  More than a month ago    Pain Frequency  Constant    Aggravating Factors   falling    Pain Relieving Factors  nothing    Multiple Pain Sites  Yes    Pain Score  7    Pain Location  Knee    Pain Orientation  Right;Left    Pain Descriptors / Indicators  Aching    Pain Type  Chronic pain    Pain Onset  More than a month ago    Pain Frequency  Constant    Aggravating Factors   nothing    Pain Relieving Factors  nothing         [facility] PT Assessment - 03/23/17 0001      Assessment   Medical Diagnosis  Gait instability, balance    Referring Provider  Scarlette Calico, MD    Onset Date/Surgical Date  -- balance has declined over 5 years, worse over 6 months    Prior Therapy  PT  following knee surgery      Precautions   Precautions  Fall      Balance Screen   Has the patient fallen in the past 6 months  Yes    How many times?  2-3    Has the patient had a decrease in activity level because of a fear of falling?   Yes    Is the patient reluctant to leave their  home because of a fear of falling?   Yes      [place]  Private residence    Living Arrangements  Spouse/significant other    Available Help at Discharge  Family;Available PRN/intermittently wife works from home    Type of [place] to enter    [employer] of Steps  2    Entrance Stairs-Rails  None post    Home Layout  Two level;Bed/bath upstairs    Alternate Level Stairs-Number of Steps  14    Alternate Level Stairs-Rails  Right    Home Equipment  None tub/shower       Prior Function   Level of Independence  Independent    Vocation  Retired    Leisure  watching sports on [employer]   Memory  Impaired    Memory Impairment  Decreased recall of new information;Decreased short term Hospital doctor  Appears Intact    Hot/Cold  Appears Intact      Coordination   Gross Motor Movements are Fluid and Coordinated  Yes    Fine Motor Movements are Fluid and Coordinated  Yes    Heel Shin Test  decreased ROM, likely strength deficit      Posture/Postural Control   Posture/Postural Control  Postural limitations    Postural Limitations  Forward head;Decreased lumbar lordosis;Flexed trunk      ROM / Strength   AROM / PROM / Strength  Strength      Strength   Overall Strength  Deficits    Overall Strength Comments  B hip flex 3+/5, all others grossly 4/5      Transfers   Transfers  Sit to Stand;Stand to Sit    Sit to Stand  5: Supervision    Five time sit to stand comments   23.84 secs without UE support    Stand to Sit  5: Supervision      Ambulation/Gait   Ambulation/Gait  Yes    Ambulation/Gait  Assistance  5: Supervision;4: Min guard    Ambulation/Gait Assistance Details  Pt only able to do short distance ambulation.  Pt having to negotiate around busy gym in order to attempt 16m walk test, however due to nausea, PT unable to get gait speed during evaluation.     Ambulation Distance (Feet)  85 Feet    Assistive device  None    Gait Pattern  Step-to pattern;Decreased stride length;Decreased hip/knee flexion - right;Decreased hip/knee flexion - left;Shuffle;Trunk flexed    Ambulation Surface  Level;Indoor             Objective measurements completed on examination: See above findings.              PT Education - 03/23/17 2002    Education provided  Yes    Education Details  POC, goals.  Lengthy discussion with pt and wife regarding underlying ETOH abuse, malnutrition and needing these to be addressed for maximal success in therapy.     Person(s) Educated  Patient;Spouse    Methods  Explanation    Comprehension  Verbalized understanding       PT Short Term Goals - 03/23/17 2018      PT SHORT TERM GOAL #1   Title  Pt will initiate HEP in order to indicate improved functional mobility and decreased fall risk.  (Target Date: 04/22/17)  Time  4    Period  Weeks    Status  New    Target Date  04/22/17      PT SHORT TERM GOAL #2   Title  Will assess gait speed and set appropriate LTG.     Time  4    Period  Weeks    Status  New      PT SHORT TERM GOAL #3   Title  Will assess BERG and improve score 4 points from baseline in order to indicate decreased fall risk.      Time  4    Period  Weeks    Status  New      PT SHORT TERM GOAL #4   Title  Will assess varying AD as appropriate to decrease fall risk.      Time  4    Period  Weeks    Status  New      PT SHORT TERM GOAL #5   Title  Pt will perform 5TSS <20 secs without UE support in order to indicate improved functional strength.      Time  4    Period  Weeks    Status  New        PT Long Term  Goals - 03/23/17 2021      PT LONG TERM GOAL #1   Title  Pt will be independent with HEP (with assist from wife as needed) in order to indicate improved functional mobility and decreased fall risk.  (Target Date: 05/22/17)    Time  8    Period  Weeks    Status  New    Target Date  05/22/17      PT LONG TERM GOAL #2   Title  Pt will improve BERG balance score by 8 points from baseline in order to indicate decreased fall risk.      Time  8    Period  Weeks    Status  New      PT LONG TERM GOAL #3   Title  Pt will perform 5TSS <17 secs without UE support in order to indicate improved functional strength.      Time  8    Period  Weeks    Status  New      PT LONG TERM GOAL #4   Title  Pt will ambulate up to 1000' over unlevel paved outdoor surfaces at mod I level with LRAD in order to indicate safe community negotiation.      Time  8    Period  Weeks    Status  New      PT LONG TERM GOAL #5   Title  Pt will verbalize initiation of community fitness program in order to maintain gains made in therapy.      Time  8    Period  Weeks    Status  New             Plan - 03/23/17 2005    Clinical Impression Statement  Pt presents with declining balance in last 5 years with even more of a decline in the last 6 months to 1 year.  Pt with history of several hard falls with resulting concussions, recent diagnosis of DMII, and ETOH abuse.  Note history of afib, HTN, and cervical spondylosis as well that could impact progress in therapy.  Wife present with pt today and verbalizes major concern about his drinking which they are seeking counseling for at  this time (they have been going for a year with no marked improvement) however pt refuses rehab.  She also verbalizes that he refuses use of pillbox causing pt to forget to take medication or be unsure as to whether he took or not and that he is not eating for long periods of time.  PT recommended verbalizing this last point to MD as he may  benefit from appetite stimulant.  Had very frank disucssion with pt regarding POC related to mobility goals, but that a lifestyle change would also be warranted to maintain any gains made in therapy.  PT to work on finding other resources for pt/wife.  Upon PT evaluation note 5TSS time of 23.84 indicative of elevated fall risk and decreased functional strength, decreased hip strength, and gait abnormalities that could lead to falls such as shuffled gait and forward flexed posture.  Unable to get gait speed or perform formal balance tests during evaluation due to pt feeling nauseated during gait.  Provided pt with regular soda as PT felt he was likely hypoglycemic due to not eating in over 12 hours per wifes report.  Pt will benefit from skilled OP neuro PT to address deficits.      History and Personal Factors relevant to plan of care:  See above    Clinical Presentation  Evolving    Clinical Decision Making  Moderate    Rehab Potential  Fair    Clinical Impairments Affecting Rehab Potential  ETOH abuse and underlying depression     PT Frequency  2x / week    PT Duration  8 weeks    PT Treatment/Interventions  ADLs/Self Care Home Management;DME Instruction;Gait training;Stair training;Functional mobility training;Therapeutic activities;Therapeutic exercise;Balance training;Neuromuscular re-education;Patient/family education;Orthotic Fit/Training;Vestibular;Passive range of motion    PT Next Visit Plan  Gait speed-add goal, BERG, initiate HEP for proximal strengthening (sit<>stand), LE stretching, balance, provide resources as able to pt/wife regarding counseling    Consulted and Agree with Plan of Care  Patient;Family member/caregiver    Family Member Consulted  wife, [name]        Patient will benefit from skilled therapeutic intervention in order to improve the following deficits and impairments:  Abnormal gait, Decreased activity tolerance, Decreased balance, Decreased cognition, Decreased  endurance, Decreased knowledge of use of DME, Decreased mobility, Decreased strength, Difficulty walking, Impaired perceived functional ability, Impaired flexibility, Postural dysfunction  Visit Diagnosis: Unsteadiness on feet - Plan: PT plan of care cert/re-cert  Repeated falls - Plan: PT plan of care cert/re-cert  Muscle weakness (generalized) - Plan: PT plan of care cert/re-cert  Other abnormalities of gait and mobility - Plan: PT plan of care cert/re-cert  Abnormal posture - Plan: PT plan of care cert/re-cert     Problem List Patient Active Problem List   Diagnosis Date Noted  . Type 2 diabetes mellitus with complication, without long-term current use of insulin ([place]) 11/19/2016  . Chronic right-sided thoracic back pain 08/15/2016  . DDD (degenerative disc disease), thoracic 06/08/2016  . Depression with anxiety 05/31/2016  . 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 Bronchiectasis without acute exacerbation ([place]) 12/31/2015  . Chronic alcoholic hepatitis 51/02/5850  . Obstructive chronic bronchitis without exacerbation ([place]) 11/12/2015  . Spinal stenosis in cervical region 10/02/2015  . Gallbladder sludge 03/02/2015  . Other iron deficiency anemias 11/13/2013  . B12 deficiency anemia 11/13/2013  . Encounter for therapeutic drug monitoring 02/25/2013  . Long term (current) use of anticoagulants 05/12/2012  . [employer] 11/01/2011  . Chronic systolic heart  failure ([place]) 08/28/2011  . Vocal cord nodule 05/19/2011  . Lumbar spinal stenosis 03/03/2011  . Seasonal and perennial allergic rhinitis 04/15/2009  . Insomnia 02/11/2009  . Obstructive sleep apnea 11/10/2008  . Hyperlipidemia with target LDL less than 100 02/14/2008  . GERD 02/14/2008  . ESOPHAGEAL STRICTURE 01/28/2008  . Essential hypertension 11/16/2006  . Coronary atherosclerosis 11/16/2006  . Atrial fibrillation ([place]) 11/16/2006   Cameron Sprang, PT, MPT [facility] [address] [place], [place], [address] Phone: [phone]   Fax:  [phone] 03/23/17, 8:28 PM  Name: Corey [name] MRN: [id number] Date of Birth: April 20, 1935

## 2017-03-24 ENCOUNTER — Other Ambulatory Visit: Payer: Self-pay | Admitting: *Deleted

## 2017-03-24 NOTE — Patient Outreach (Signed)
[facility]) Care Management  03/24/2017  Corey [name] Oct 06, 1935 [id number]   Referral received 2/27 Referral from : PCP office, Dr.Jones  Referral Reason : Poorly controlled Diabetes  Telephone Screen call #1  Unsuccessful outreach call to patient, no answer able to leave a HIPAA compliant message for return call.  Plan Will await return call , if no response today, will schedule call on next business day.    Joylene Draft, RN, [place] Management Coordinator  [phone]- Mobile [phone]- Toll Free Main Office

## 2017-03-24 NOTE — Telephone Encounter (Signed)
Thank you for the feedback.  I am aware of his issues with alcohol intake and the complications its had with respect to his health.  To date, he has been refractory to any recommendations about abtaining from alcohol abuse.  I have discussed this with he and his wife several times and he is not interested in going into a detox facility or doing any type of rehab.  He repeatedly tells me over and over again he wants to continue drinking.  He is trying to limit it to 2-3 beverages a day.

## 2017-03-25 ENCOUNTER — Ambulatory Visit: Payer: Medicare Other | Admitting: Family Medicine

## 2017-03-25 ENCOUNTER — Encounter: Payer: Self-pay | Admitting: Internal Medicine

## 2017-03-25 ENCOUNTER — Ambulatory Visit (INDEPENDENT_AMBULATORY_CARE_PROVIDER_SITE_OTHER): Payer: Medicare Other | Admitting: Internal Medicine

## 2017-03-25 ENCOUNTER — Other Ambulatory Visit: Payer: Self-pay

## 2017-03-25 VITALS — BP 124/72 | HR 73 | Temp 98.0°F | Resp 16 | Ht 72.0 in | Wt 199.0 lb

## 2017-03-25 DIAGNOSIS — J181 Lobar pneumonia, unspecified organism: Secondary | ICD-10-CM

## 2017-03-25 DIAGNOSIS — J189 Pneumonia, unspecified organism: Secondary | ICD-10-CM | POA: Insufficient documentation

## 2017-03-25 MED ORDER — PROMETHAZINE-DM 6.25-15 MG/5ML PO SYRP: 5.0000 mL | ORAL_SOLUTION | Freq: Four times a day (QID) | ORAL | 0 refills | Status: AC | PRN

## 2017-03-25 MED ORDER — CEFDINIR 300 MG PO CAPS
300.0000 mg | ORAL_CAPSULE | Freq: Two times a day (BID) | ORAL | 0 refills | Status: AC
Start: 2017-03-25 — End: 2017-04-04

## 2017-03-25 NOTE — Patient Instructions (Signed)

## 2017-03-25 NOTE — Progress Notes (Signed)
Subjective:  Patient ID: Corey [name], male    DOB: 03-27-1935  Age: 82 y.o. MRN: [id number]  CC: Cough   HPI Corey [name] presents for concerns about a cough and sore throat.  His symptoms started about 1-2 weeks ago and have gradually worsened.  The cough is productive of a thick brown/red tinged phlegm.  The cough keeps him awake at night. He denies fever, chills, shortness of breath, or diaphoresis.  He has had a few wheezes but tells me his LABA/LAMA inhaler has controlled the wheezing.  Outpatient Medications Prior to Visit  Medication Sig Dispense Refill  . amiodarone (PACERONE) 200 MG tablet TAKE ONE-HALF TABLET DAILY 45 tablet 3  . Empagliflozin-metFORMIN HCl (SYNJARDY) 05-998 MG TABS Take 1 tablet by mouth 2 (two) times daily. 170 tablet 1  . folic acid (FOLVITE) 1 MG tablet TAKE ONE TABLET EVERY DAY 90 tablet 1  . levothyroxine (SYNTHROID, LEVOTHROID) 125 MCG tablet TAKE ONE TABLET EACH DAY 90 tablet 1  . metoprolol succinate (TOPROL-XL) 50 MG 24 hr tablet TAKE ONE-HALF TABLET EVERY EVENING 15 tablet 11  . mometasone (NASONEX) 50 MCG/ACT nasal spray 1-2 puffs each nostril once or twice daily if needed 17 g 0  . NONFORMULARY OR COMPOUNDED ITEM Allergy Vaccine 1:10 Given at [facility]    . pantoprazole (PROTONIX) 40 MG tablet TAKE ONE TABLET BY MOUTH ONCE DAILY 90 tablet 0  . rosuvastatin (CRESTOR) 5 MG tablet Take 1 tablet (5 mg total) by mouth daily at 6 PM. 90 tablet 1  . sertraline (ZOLOFT) 100 MG tablet Take 1 tablet (100 mg total) by mouth daily. 90 tablet 3  . spironolactone (ALDACTONE) 25 MG tablet TAKE ONE TABLET BY MOUTH ONCE DAILY 30 tablet 8  . umeclidinium-vilanterol (ANORO ELLIPTA) 62.5-25 MCG/INH AEPB Inhale 1 puff into the lungs daily. 90 each 3  . warfarin (COUMADIN) 2.5 MG tablet TAKE AS DIRECTED BY COUMADIN CLINIC 35 tablet 1  . albuterol (ACCUNEB) 0.63 MG/3ML nebulizer solution Inhale into the lungs.    Marland Kitchen albuterol (PROVENTIL HFA;VENTOLIN HFA)  108 (90 BASE) MCG/ACT inhaler Inhale 1-2 puffs into the lungs every 6 (six) hours as needed for shortness of breath. (Patient not taking: Reported on 03/25/2017) 1 Inhaler 11  . levalbuterol (XOPENEX) 1.25 MG/3ML nebulizer solution Inhale into the lungs.     No facility-administered medications prior to visit.     ROS Review of Systems  Constitutional: Negative.  Negative for appetite change, diaphoresis, fatigue and unexpected weight change.  HENT: Positive for sore throat. Negative for facial swelling, trouble swallowing and voice change.   Eyes: Negative.   Respiratory: Positive for cough. Negative for chest tightness, shortness of breath and wheezing.   Cardiovascular: Negative for chest pain, palpitations and leg swelling.  Gastrointestinal: Negative for abdominal pain, constipation, diarrhea, nausea and vomiting.  Endocrine: Negative.   Genitourinary: Negative.   Musculoskeletal: Negative.  Negative for arthralgias, myalgias and neck pain.  Skin: Negative.  Negative for color change and rash.  Allergic/Immunologic: Negative.   Neurological: Negative.  Negative for dizziness, weakness and light-headedness.  Hematological: Negative for adenopathy. Does not bruise/bleed easily.  Psychiatric/Behavioral: Negative.     Objective:  BP 124/72   Pulse 73   Temp 98 F (36.7 C) (Oral)   Resp 16   Ht 6' (1.829 m)   Wt 199 lb (90.3 kg)   SpO2 96%   BMI 26.99 kg/m   BP Readings from Last 3 Encounters:  03/25/17 124/72  03/22/17  120/60  12/14/16 110/72    Wt Readings from Last 3 Encounters:  03/25/17 199 lb (90.3 kg)  03/22/17 202 lb (91.6 kg)  12/14/16 200 lb 6.4 oz (90.9 kg)    Physical Exam  Constitutional: He is oriented to person, place, and time. No distress.  HENT:  Mouth/Throat: Oropharynx is clear and moist. No oropharyngeal exudate.  Eyes: Conjunctivae are normal. Left eye exhibits no discharge. No scleral icterus.  Neck: Normal range of motion. Neck supple. No JVD  present. No thyromegaly present.  Cardiovascular: Normal rate, regular rhythm and normal heart sounds. Exam reveals no gallop.  No murmur heard. Pulmonary/Chest: Effort normal. No tachypnea. No respiratory distress. He has no decreased breath sounds. He has no wheezes. He has no rhonchi. He has rales in the right lower field. He exhibits no tenderness.  Abdominal: Soft. Bowel sounds are normal. He exhibits no distension and no mass. There is no tenderness. There is no guarding.  Musculoskeletal: Normal range of motion. He exhibits no edema, tenderness or deformity.  Lymphadenopathy:    He has no cervical adenopathy.  Neurological: He is alert and oriented to person, place, and time.  Skin: Skin is warm and dry. No rash noted. He is not diaphoretic. No erythema. No pallor.  Psychiatric: He has a normal mood and affect. His behavior is normal. Judgment and thought content normal.  Vitals reviewed.   Lab Results  Component Value Date   WBC 6.3 03/22/2017   HGB 13.9 03/22/2017   HCT 40.9 03/22/2017   PLT 166.0 03/22/2017   GLUCOSE 144 (H) 03/22/2017   CHOL 168 11/16/2016   TRIG 147.0 11/16/2016   HDL 40.20 11/16/2016   LDLCALC 99 11/16/2016   ALT 96 (H) 03/22/2017   AST 205 (H) 03/22/2017   NA 136 03/22/2017   K 4.4 03/22/2017   CL 99 03/22/2017   CREATININE 1.23 03/22/2017   BUN 15 03/22/2017   CO2 30 03/22/2017   TSH 4.15 03/22/2017   PSA <0.015 05/26/2016   INR 1.8 02/02/2017   HGBA1C 8.2 03/22/2017   MICROALBUR 10.7 (H) 03/22/2017    Dg Chest 2 View  Result Date: 12/09/2016 CLINICAL DATA:  Palpitations, chest pain for 1 day EXAM: CHEST  2 VIEW COMPARISON:  03/30/2016 FINDINGS: There is no focal parenchymal opacity. There is no pleural effusion or pneumothorax. The heart and mediastinal contours are unremarkable. There is a 3 lead cardiac pacemaker. There is no acute osseous abnormality. There is bilateral arthropathy of the acromioclavicular joints. IMPRESSION: No active  cardiopulmonary disease. Electronically Signed   By: Kathreen Devoid   On: 12/09/2016 12:40    Assessment & Plan:   [name] was seen today for cough.  Diagnoses and all orders for this visit:  Pneumonia of right lower lobe due to infectious organism [facility])- His symptoms and exam are consistent with right lower lobe pneumonia.  Will empirically treat with Omnicef.  Chest x-ray is not an option at the clinic today so he will come back to our office in 2 days for a chest x-ray.  Will treat the cough with Phenergan DM as needed. -     cefdinir (OMNICEF) 300 MG capsule; Take 1 capsule (300 mg total) by mouth 2 (two) times daily for 10 days. -     promethazine-dextromethorphan (PROMETHAZINE-DM) 6.25-15 MG/5ML syrup; Take 5 mLs by mouth 4 (four) times daily as needed for up to 7 days for cough. -     DG Chest 2 View; Future  I am having Corey [name] start on cefdinir and promethazine-dextromethorphan. I am also having him maintain his albuterol, NONFORMULARY OR COMPOUNDED ITEM, levalbuterol, albuterol, umeclidinium-vilanterol, mometasone, metoprolol succinate, sertraline, spironolactone, levothyroxine, rosuvastatin, folic acid, amiodarone, warfarin, pantoprazole, and Empagliflozin-metFORMIN HCl.  Meds ordered this encounter  Medications  . cefdinir (OMNICEF) 300 MG capsule    Sig: Take 1 capsule (300 mg total) by mouth 2 (two) times daily for 10 days.    Dispense:  20 capsule    Refill:  0  . promethazine-dextromethorphan (PROMETHAZINE-DM) 6.25-15 MG/5ML syrup    Sig: Take 5 mLs by mouth 4 (four) times daily as needed for up to 7 days for cough.    Dispense:  118 mL    Refill:  0     Follow-up: Return in about 5 days (around 03/30/2017).  Scarlette Calico, MD

## 2017-03-26 MED ORDER — VITAMIN B-1 100 MG PO TABS
100.0000 mg | ORAL_TABLET | Freq: Every day | ORAL | 1 refills | Status: DC
Start: 2017-03-26 — End: 2017-10-09

## 2017-03-27 ENCOUNTER — Ambulatory Visit (INDEPENDENT_AMBULATORY_CARE_PROVIDER_SITE_OTHER)
Admission: RE | Admit: 2017-03-27 | Discharge: 2017-03-27 | Disposition: A | Discharge status: Home / Self Care | Payer: Medicare Other | Source: Ambulatory Visit | Attending: Internal Medicine | Admitting: Internal Medicine

## 2017-03-27 ENCOUNTER — Encounter: Payer: Self-pay | Admitting: *Deleted

## 2017-03-27 ENCOUNTER — Other Ambulatory Visit: Payer: Self-pay | Admitting: *Deleted

## 2017-03-27 DIAGNOSIS — R05 Cough: Secondary | ICD-10-CM | POA: Diagnosis not present

## 2017-03-27 DIAGNOSIS — J181 Lobar pneumonia, unspecified organism: Secondary | ICD-10-CM

## 2017-03-27 DIAGNOSIS — J189 Pneumonia, unspecified organism: Secondary | ICD-10-CM

## 2017-03-27 DIAGNOSIS — R0602 Shortness of breath: Secondary | ICD-10-CM | POA: Diagnosis not present

## 2017-03-27 NOTE — Patient Outreach (Signed)
[facility]) Care Management  03/27/2017  Corey [name] [birth year] [id number]   Referral received 2/27 Referral from : PCP office, Dr.Jones  Referral Reason : Poorly controlled Diabetes  Successful telephone outreach to patient , his wife answered phone l, HIPAA information verified, explained the reason for the call, she then placed patient on the phone, explained purpose of call and [facility] care management services he gives verbal consent to program and verbal agreement that we speak with his wife.   Medical Conditions  Wife reports being overwhelmed over the last week, with new diagnosis of Diabetes, and diagnosis of pneumonia over the weekend after visit with PCP, new prescription for  Antibiotics, went for a chest xray today.  She discussed patient was started on medication for diabetes on last week, and she just doesn't know how to help him with this, discussed she didn't know it was important to have eyes checked if he had diabetes until last week., and it has been over 10 years since he had eye checked. Patient does not currently monitor blood sugars.  She discussed patient has history of ICD pacemaker history of Atrial fib, COPD, Hypertension , Heart failure, ( does not weigh at home),OSA wears cpap,Alzheimer's Dementia, Alcohol abuse, depression .  Medication concerns Wife reports patient taking over 10 medications, no cost concerns. She discussed patient does not always take medication as prescribed,he gets confused about what to take, he does not want to use pill organizer or allow her to assist him.   Fall Risk Reports 2 falls in the last 2 months, patient has difficulty with balance does not use a cane or walker. Patient to begin outpatient physical therapy in this month.   Social  Patient lives at home with his wife, reports independent with ADL's. Wife reports she does most of the driving , but patient does still  drive some.   Advanced Directive Patient  has living well, HCPOA  Assessment  Patient will benefit from [facility] care management community care referral for complex care management of chronic disease, Diabetes wife states it would be best to have a visit in home face to face.  Patient will benefit from Pharmacy referral for concerns related to medication adherence.  Patient and wife have given verbal consent to [facility] care management services.  Offered wife [facility] care management contact numbers, she states she will get them at the next call.   Plan RNCM placed referral for [facility] care management community  Care coordinator for Diabetes education and management,  RNCM will place Pharmacy referral for concern related to medication adherence.  Will send successful outreach letter.    Joylene Draft, RN, [place] Management Coordinator  [phone]- Mobile [phone]- Toll Free Main Office

## 2017-03-28 ENCOUNTER — Other Ambulatory Visit: Payer: Self-pay | Admitting: *Deleted

## 2017-03-28 LAB — THYROID PANEL WITH TSH
Free Thyroxine Index: 2.5 (ref 1.4–3.8)
T3 Uptake: 28 % (ref 22–35)
T4, Total: 9.1 ug/dL (ref 4.9–10.5)
TSH: 4.15 mIU/L (ref 0.40–4.50)

## 2017-03-28 LAB — VITAMIN B1: Vitamin B1 (Thiamine): 11 nmol/L (ref 8–30)

## 2017-03-28 NOTE — Progress Notes (Signed)
This encounter was created in error - please disregard.

## 2017-03-28 NOTE — Patient Outreach (Signed)
[facility]) Care Management  03/28/2017  Corey [name] 1935/09/11 [id number]   Referral received requesting home assessment and assistance with diabetes and medication management.  Per chart, he also has history of hypertension, a-fib, heart failure, sleep apnea, GERD, and hypothyroidism.  Per note during initial telephone assessment, wife is primary contact.  Call placed to wife, no answer.  HIPAA compliant voice message left.  Will await call back, if no call back will follow up tomorrow with 2nd attempt.     Update @ 1620:  Call received back from wife.  Member's identity verified.  This care manager introduced self and purpose of call.  She agrees to home visit next week, denies any urgent concerns.  Will perform assessment and develop individualized plan of care at that time.  Valente David, [place], MSN [place] [phone]

## 2017-03-29 ENCOUNTER — Ambulatory Visit: Payer: Self-pay | Admitting: *Deleted

## 2017-03-29 DIAGNOSIS — F4323 Adjustment disorder with mixed anxiety and depressed mood: Secondary | ICD-10-CM | POA: Diagnosis not present

## 2017-03-30 ENCOUNTER — Encounter: Payer: Self-pay | Admitting: Rehabilitation

## 2017-03-30 ENCOUNTER — Ambulatory Visit: Payer: Medicare Other | Attending: Internal Medicine | Admitting: Rehabilitation

## 2017-03-30 DIAGNOSIS — R2689 Other abnormalities of gait and mobility: Secondary | ICD-10-CM | POA: Diagnosis not present

## 2017-03-30 DIAGNOSIS — M6281 Muscle weakness (generalized): Secondary | ICD-10-CM | POA: Diagnosis not present

## 2017-03-30 DIAGNOSIS — R2681 Unsteadiness on feet: Secondary | ICD-10-CM | POA: Diagnosis not present

## 2017-03-30 DIAGNOSIS — R296 Repeated falls: Secondary | ICD-10-CM | POA: Insufficient documentation

## 2017-03-30 NOTE — Therapy (Signed)
[place] [address] [place], [place], [address] Phone: [phone]   Fax:  [phone]  Physical Therapy Treatment  Patient Details  Name: Corey [name] MRN: [id number] Date of Birth: April 23, 1935 Referring Provider: Scarlette Calico, MD   Encounter Date: 03/30/2017  PT End of Session - 03/30/17 2006    Visit Number  2    Number of Visits  17    Date for PT Re-Evaluation  05/22/17    Authorization Type  Medicare    PT Start Time  1402    PT Stop Time  1445    PT Time Calculation (min)  43 min    Activity Tolerance  Treatment limited secondary to medical complications (Comment)    Behavior During Therapy  [facility] for tasks assessed/performed       Past Medical History:  Diagnosis Date  . Alcohol abuse, episodic 05/19/2008  . ALLERGIC RHINITIS 04/15/2009  . Allergy    YEAR ROUND,TAKE SHOTS  . Alzheimer's dementia    beginning with sx  . Anxiety   . Arthritis   . ASTHMA 11/16/2006   sinusitis-    hx ?yeast patch/white patch on vocal cord as per [facility] 02/25/11-   . Atrial fibrillation ([place]) 11/16/2006   remote CHF related to atrial fib with rapid ventricular response over 25 yrs per office note,  . Cancer [facility]) 2002   prostate cancer  . Chronic systolic CHF (congestive heart failure) (HCC)    echo (10/02/12): EF 71-24%, grade 1 diastolic dysfunction, trivial AI, mild MR, mild RVE  . COLONIC POLYPS, ADENOMATOUS, HX OF 02/14/2008  . CORONARY ARTERY DISEASE 11/16/2006   LHC (07/2011): LAD with luminal irregularities, proximal circumflex 50%, EF 25%  . Depression   . DIVERTICULOSIS, COLON 02/14/2008  . ESOPHAGEAL STRICTURE 01/28/2008  . GERD 02/14/2008  . Headache(784.0)   . HYPERGLYCEMIA 11/16/2006  . HYPERLIPIDEMIA 02/14/2008  . HYPERTENSION 11/16/2006  . ICD (implantable cardiac defibrillator) in place   . INSOMNIA-SLEEP DISORDER-UNSPEC 02/11/2009  . Lesion of vocal cord    bx begine  . Myocardial infarction ([place]) 1993   . NICM (nonischemic cardiomyopathy) ([place])   . OBSTRUCTIVE SLEEP APNEA 11/10/2008  . 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 Pacemaker    St. Jude  . Pneumonia    hx of  . PROSTATE CANCER, HX OF 02/25/2000  . SLEEP APNEA 10/03/2009   LOV Dr [name] 12/12 in EPIC    Moderate per patient- settings ?6/last sleep study years ago  . Sleep apnea   . Stroke ([place])    Tia  . Symptomatic bradycardia, secondary to sinus node dysfunction 07/08/2011   s/p [facility] Scientific PPM by Dr Sallyanne Kuster, upgraded to [place] ICD 12/13 by Dr Rayann Heman (SJM)  . TRANSIENT ISCHEMIC ATTACKS, HX OF 11/16/2006  . Type 2 diabetes mellitus with complication, without long-term current use of insulin ([place]) 11/19/2016    Past Surgical History:  Procedure Laterality Date  . BACK SURGERY    . BI-VENTRICULAR IMPLANTABLE CARDIOVERTER DEFIBRILLATOR UPGRADE N/A 01/16/2012   Procedure: BI-VENTRICULAR IMPLANTABLE CARDIOVERTER DEFIBRILLATOR UPGRADE;  Surgeon: Thompson Grayer, MD;  Location: [facility] CATH LAB;  Service: Cardiovascular;  Laterality: N/A;  . CARDIAC CATHETERIZATION     several  . CARDIAC DEFIBRILLATOR PLACEMENT  01/16/12   Upgrade to a biventricular SJM ICD by DR Allred  . COLONOSCOPY    . ESOPHAGOGASTRODUODENOSCOPY     with dilitation  . INSERT / REPLACE / REMOVE PACEMAKER    . KNEE ARTHROSCOPY  2 surgeries right and 1 left  . LEFT AND RIGHT HEART CATHETERIZATION WITH CORONARY ANGIOGRAM N/A 08/18/2011   Procedure: LEFT AND RIGHT HEART CATHETERIZATION WITH CORONARY ANGIOGRAM;  Surgeon: [name], MD;  Location: [place] CATH LAB;  Service: Cardiovascular;  Laterality: N/A;  . LUMBAR LAMINECTOMY/DECOMPRESSION MICRODISCECTOMY  03/02/2011   Procedure: LUMBAR LAMINECTOMY/DECOMPRESSION MICRODISCECTOMY;  Surgeon: [name], MD;  Location: [facility] ORS;  Service: Orthopedics;  Laterality: N/A;  Decompression Lumbar 4 - Lumbar(X-Ray)  . PACEMAKER INSERTION  06/2011   [employer] PPM implanted by Dr Sallyanne Kuster  . PERMANENT PACEMAKER  INSERTION Left 07/08/2011   Procedure: PERMANENT PACEMAKER INSERTION;  Surgeon: [name], MD;  Location: [place] CATH LAB;  Service: Cardiovascular;  Laterality: Left;  . PROSTATE SURGERY  2/02   prostate cancer  . TONSILECTOMY, ADENOIDECTOMY, BILATERAL MYRINGOTOMY AND TUBES    . TONSILLECTOMY  as child  . TOTAL KNEE ARTHROPLASTY Left 06/04/2012   Procedure: TOTAL KNEE ARTHROPLASTY;  Surgeon: [name], MD;  Location: [facility] ORS;  Service: Orthopedics;  Laterality: Left;  . UVULOPALATOPHARYNGOPLASTY  2013  . vocal cord lesion bx      There were no vitals filed for this visit.  Subjective Assessment - 03/30/17 1409    Subjective  Pt does not recall seeing this PT last week or what we talked about or did during evaluation.      Patient is accompained by:  Family member    Pertinent History  Pt goes by "[employer] hold activities;Walking    Patient Stated Goals  "I want to be able to walk a straight line and not fall down.  I want to stand up straight."     Currently in Pain?  Yes    Pain Score  3     Pain Location  Back    Pain Orientation  Lower    Pain Descriptors / Indicators  Aching    Pain Type  Chronic pain    Pain Onset  More than a month ago    Pain Frequency  Constant    Aggravating Factors   falling    Pain Relieving Factors  nothing                      [facility] Adult PT Treatment/Exercise - 03/30/17 1411      Ambulation/Gait   Ambulation/Gait  Yes    Ambulation/Gait Assistance  5: Supervision    Ambulation/Gait Assistance Details  Pt able to tolerate increased ambulation during session today without nausea.  Note gait speed [facility] however demonstrates intermittent lateral sway and somewhat shuffled gait pattern.  Also assessed gait with SPC during session due to high fall risk and multiples falls.  Note marked improvement with gait speed and fluidity and was able to improve heel to toe contact with cues.      Ambulation Distance (Feet)  300  Feet    Assistive device  None;Straight cane    Gait Pattern  Step-to pattern;Decreased stride length;Decreased hip/knee flexion - right;Decreased hip/knee flexion - left;Shuffle;Trunk flexed    Ambulation Surface  Level;Indoor    Gait velocity  3.36 ft/sec without AD      Standardized Balance Assessment   Standardized Balance Assessment  Berg Balance Test      Berg Balance Test   Sit to Stand  Able to stand without using hands and stabilize independently    Standing Unsupported  Able to stand safely 2 minutes  Sitting with Back Unsupported but Feet Supported on Floor or Stool  Able to sit safely and securely 2 minutes    Stand to Sit  Sits safely with minimal use of hands    Transfers  Able to transfer safely, minor use of hands    Standing Unsupported with Eyes Closed  Able to stand 10 seconds with supervision    Standing Ubsupported with Feet Together  Able to place feet together independently and stand for 1 minute with supervision    From Standing, Reach Forward with Outstretched Arm  Can reach confidently >25 cm (10")    From Standing Position, Pick up Object from Floor  Able to pick up shoe, needs supervision    From Standing Position, Turn to Look Behind Over each Shoulder  Looks behind from both sides and weight shifts well    Turn 360 Degrees  Able to turn 360 degrees safely but slowly    Standing Unsupported, Alternately Place Feet on Step/Stool  Able to complete 4 steps without aid or supervision    Standing Unsupported, One Foot in Front  Able to plae foot ahead of the other independently and hold 30 seconds    Standing on One Leg  Tries to lift leg/unable to hold 3 seconds but remains standing independently    Total Score  45      Neuro Re-ed    Neuro Re-ed Details   Provided pt with initial HEP for counter top balance, see pt instruction      Exercises   Exercises  Other Exercises    Other Exercises   Initiated HEP for BLE strength, see pt instruction for details.                PT Education - 03/30/17 2006    Education provided  Yes    Education Details  HEP for balance and strength    Person(s) Educated  Patient    Methods  Explanation;Demonstration;Handout    Comprehension  Verbalized understanding;Returned demonstration       PT Short Term Goals - 03/30/17 2011      PT SHORT TERM GOAL #1   Title  Pt will initiate HEP in order to indicate improved functional mobility and decreased fall risk.  (Target Date: 04/22/17)    Time  4    Period  Weeks    Status  New      PT SHORT TERM GOAL #2   Title  Will assess gait speed and set appropriate LTG.     Baseline  3.36 ft/sec without AD on 03/30/17    Time  4    Period  Weeks    Status  Achieved      PT SHORT TERM GOAL #3   Title  Will assess BERG and improve score 4 points from baseline in order to indicate decreased fall risk.      Baseline  45/56 on 03/30/17    Time  4    Period  Weeks    Status  New      PT SHORT TERM GOAL #4   Title  Will assess varying AD as appropriate to decrease fall risk.      Baseline  assessed SPC on 03/30/17, will continue to address    Time  4    Period  Weeks    Status  New      PT SHORT TERM GOAL #5   Title  Pt will perform 5TSS <20 secs without UE support in  order to indicate improved functional strength.      Time  4    Period  Weeks    Status  New        PT Long Term Goals - 03/23/17 2021      PT LONG TERM GOAL #1   Title  Pt will be independent with HEP (with assist from wife as needed) in order to indicate improved functional mobility and decreased fall risk.  (Target Date: 05/22/17)    Time  8    Period  Weeks    Status  New    Target Date  05/22/17      PT LONG TERM GOAL #2   Title  Pt will improve BERG balance score by 8 points from baseline in order to indicate decreased fall risk.      Time  8    Period  Weeks    Status  New      PT LONG TERM GOAL #3   Title  Pt will perform 5TSS <17 secs without UE support in order to indicate  improved functional strength.      Time  8    Period  Weeks    Status  New      PT LONG TERM GOAL #4   Title  Pt will ambulate up to 1000' over unlevel paved outdoor surfaces at mod I level with LRAD in order to indicate safe community negotiation.      Time  8    Period  Weeks    Status  New      PT LONG TERM GOAL #5   Title  Pt will verbalize initiation of community fitness program in order to maintain gains made in therapy.      Time  8    Period  Weeks    Status  New            Plan - 03/30/17 2006    Clinical Impression Statement  Note that pt presents today by himself.  He reports that he drove today, but had difficulty with directions.  Note he also did not recall our evaluation last week at all.  Skilled session focused on assessment of gait speed, balance and beginning initial HEP for balance and strength.  Also assessed use of SPC with gait due to elevated fall risk.  Note BERG balance score of 45/56 indicative of significant fall risk, however gait speed was 3.36 ft/sec without SPC.  Will update goals as needed.      Rehab Potential  Fair    Clinical Impairments Affecting Rehab Potential  ETOH abuse and underlying depression     PT Frequency  2x / week    PT Duration  8 weeks    PT Treatment/Interventions  ADLs/Self Care Home Management;DME Instruction;Gait training;Stair training;Functional mobility training;Therapeutic activities;Therapeutic exercise;Balance training;Neuromuscular re-education;Patient/family education;Orthotic Fit/Training;Vestibular;Passive range of motion    PT Next Visit Plan  Check compliance with HEP,  LE stretching, balance-compliant surfaces, narrow BOS, continue gait with SPC as able (educated to use in community)    Oncologist with Plan of Care  Patient    Family Member Consulted  --       Patient will benefit from skilled therapeutic intervention in order to improve the following deficits and impairments:  Abnormal gait, Decreased  activity tolerance, Decreased balance, Decreased cognition, Decreased endurance, Decreased knowledge of use of DME, Decreased mobility, Decreased strength, Difficulty walking, Impaired perceived functional ability, Impaired flexibility, Postural dysfunction  Visit  Diagnosis: Unsteadiness on feet  Repeated falls  Muscle weakness (generalized)  Other abnormalities of gait and mobility     Problem List Patient Active Problem List   Diagnosis Date Noted  . Pneumonia of right lower lobe due to infectious organism ([place]) 03/25/2017  . Type 2 diabetes mellitus with complication, without long-term current use of insulin ([place]) 11/19/2016  . Chronic right-sided thoracic back pain 08/15/2016  . DDD (degenerative disc disease), thoracic 06/08/2016  . Depression with anxiety 05/31/2016  . 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 Bronchiectasis without acute exacerbation ([place]) 12/31/2015  . Chronic alcoholic hepatitis 99/77/4142  . Obstructive chronic bronchitis without exacerbation ([place]) 11/12/2015  . Spinal stenosis in cervical region 10/02/2015  . Gallbladder sludge 03/02/2015  . Other iron deficiency anemias 11/13/2013  . B12 deficiency anemia 11/13/2013  . Encounter for therapeutic drug monitoring 02/25/2013  . Long term (current) use of anticoagulants 05/12/2012  . [employer] 11/01/2011  . Chronic systolic heart failure ([place]) 08/28/2011  . Vocal cord nodule 05/19/2011  . Lumbar spinal stenosis 03/03/2011  . Seasonal and perennial allergic rhinitis 04/15/2009  . Insomnia 02/11/2009  . Obstructive sleep apnea 11/10/2008  . Hyperlipidemia with target LDL less than 100 02/14/2008  . GERD 02/14/2008  . ESOPHAGEAL STRICTURE 01/28/2008  . Essential hypertension 11/16/2006  . Coronary atherosclerosis 11/16/2006  . Atrial fibrillation ([place]) 11/16/2006    Cameron Sprang, PT, MPT [facility] [address] [place],  [place], [address] Phone: [phone]   Fax:  [phone] 03/30/17, 8:14 PM  Name: JAYMIE MISCH MRN: [id number] Date of Birth: [birth year]

## 2017-03-30 NOTE — Patient Instructions (Signed)
FUNCTIONAL MOBILITY: Marching - Forwards/backwards    Hold on to counter top as you need, but be as light as you can.  March forwards bringing knees as tall as you can and go slowly.  When you get to the end of the counter top, march backwards.  Do 2 laps down and back. Do 2 times per day.    Copyright  VHI. All rights reserved.   Tandem Walking    Do beside of the counter top for support.  Be as light as you can.  Walk with each foot directly in front of other, heel of one foot touching toes of other foot with each step. Both feet straight ahead.  Do forwards then backwards.  Do 2 laps down and back.  Do 2 times per day.     Copyright  VHI. All rights reserved.   HIP: Abduction - Standing    Squeeze glutes. Raise leg out and slightly back. 10___ reps per set, __2_ sets per day, _5-7__ days per week Hold onto a support.  Copyright  VHI. All rights reserved.   Standing Hip Extension    Bring leg back as far as possible. Repeat with other leg. Repeat _10___ times. Do _2___ sessions per day.  http://[email or web address].exer.us/395   Copyright  VHI. All rights reserved.

## 2017-04-01 ENCOUNTER — Other Ambulatory Visit: Payer: Self-pay | Admitting: Internal Medicine

## 2017-04-01 DIAGNOSIS — E032 Hypothyroidism due to medicaments and other exogenous substances: Secondary | ICD-10-CM

## 2017-04-04 ENCOUNTER — Ambulatory Visit: Payer: Medicare Other | Admitting: Gastroenterology

## 2017-04-05 ENCOUNTER — Other Ambulatory Visit: Payer: Self-pay | Admitting: Pharmacist

## 2017-04-05 NOTE — Patient Outreach (Signed)
[place] [facility]) Care Management  04/05/2017  Corey [name] 09/08/1935 [id number]    82 y.o. year old male referred to [facility] for Medication Management (Pharmacy Outreach Call- Initial)  Calling per referral to complete a medication review and assess further medication management needs. Patient takes over 10 medications and "frequently confuses" instructions.   Was unable to reach patient's wife Corey [name]- primary contact) via telephone today and have left HIPAA compliant voicemail asking patient's wife Corey [name]) to return my call (unsuccessful outreach #1).  Plan: Will followup within 1 business days via telephone  Bertis Ruddy, [facility]

## 2017-04-06 ENCOUNTER — Other Ambulatory Visit: Payer: Self-pay | Admitting: *Deleted

## 2017-04-06 ENCOUNTER — Ambulatory Visit: Payer: Self-pay | Admitting: Pharmacist

## 2017-04-06 DIAGNOSIS — F4323 Adjustment disorder with mixed anxiety and depressed mood: Secondary | ICD-10-CM | POA: Diagnosis not present

## 2017-04-06 NOTE — Patient Outreach (Signed)
[place] [facility]) Care Management  04/06/2017  Corey [name] Apr 18, 1935 [id number]   Arrived at [facility] home for visit as scheduled, member not available yet, but wife report he will be home soon.  [facility] care management services explained.  She state member was upset with recent diagnosis of diabetes, however since then they have been working with a friend from the church who is a Software engineer at [employer] (P. Valentina Lucks) to help with management.  She feel they are beginning to manage condition well, but she does still have questions regarding appropriate diet.  Advised that [facility] will be able to help with education.  Disciplines within [facility] explained Engineer, structural, telephonic health coach, pharmacist, Education officer, museum).  Wife does not feel they will need much assistance due to the help of Mr. Valentina Lucks, but will allow member to make final decision.  Upon member's arrival to the home, [facility] care management services again explained as well as reason for referral.  He report he feel he is doing well, has already started cutting down on sugars (particularly sweet tea).  He denies any concern with medication management and falls.  He is active with outpatient physical therapy, state he feels this is working for stability.  Role of of telephonic health coach explained, but decision made to decline services.  They are open to examples of sample meal plans and general diabetes education.  Will assign [facility] education regarding management of diabetes and carb counting, will mail Living Well with Diabetes book.  Provided member/wife with business card.  Advised to contact with questions or if they decide to be involved in program.  Will notify assigned pharmacist of decline of services, will notify care management assistant and primary MD of case closure.  Valente David, [place], MSN [place] [phone]

## 2017-04-07 ENCOUNTER — Ambulatory Visit: Payer: Medicare Other | Admitting: Rehabilitation

## 2017-04-08 ENCOUNTER — Other Ambulatory Visit: Payer: Self-pay | Admitting: Nurse Practitioner

## 2017-04-10 ENCOUNTER — Encounter: Payer: Self-pay | Admitting: Physical Therapy

## 2017-04-10 ENCOUNTER — Other Ambulatory Visit: Payer: Self-pay | Admitting: Pharmacist

## 2017-04-10 ENCOUNTER — Ambulatory Visit: Payer: Medicare Other | Admitting: Physical Therapy

## 2017-04-10 DIAGNOSIS — R296 Repeated falls: Secondary | ICD-10-CM

## 2017-04-10 DIAGNOSIS — R2681 Unsteadiness on feet: Secondary | ICD-10-CM | POA: Diagnosis not present

## 2017-04-10 DIAGNOSIS — M6281 Muscle weakness (generalized): Secondary | ICD-10-CM | POA: Diagnosis not present

## 2017-04-10 DIAGNOSIS — R2689 Other abnormalities of gait and mobility: Secondary | ICD-10-CM | POA: Diagnosis not present

## 2017-04-10 NOTE — Therapy (Signed)
[facility] [address] [place], [place], [address] Phone: [phone]   Fax:  [phone]  Physical Therapy Treatment  Patient Details  Name: Corey [name] MRN: [id number] Date of Birth: 03/07/1935 Referring Provider: Scarlette Calico, MD   Encounter Date: 04/10/2017  PT End of Session - 04/10/17 1811    Visit Number  3    Number of Visits  17    Date for PT Re-Evaluation  05/22/17    Authorization Type  Medicare    PT Start Time  1445    PT Stop Time  1528    PT Time Calculation (min)  43 min    Equipment Utilized During Treatment  Gait belt    Activity Tolerance  Treatment limited secondary to medical complications (Comment)    Behavior During Therapy  [facility] for tasks assessed/performed       Past Medical History:  Diagnosis Date  . Alcohol abuse, episodic 05/19/2008  . ALLERGIC RHINITIS 04/15/2009  . Allergy    YEAR ROUND,TAKE SHOTS  . Alzheimer's dementia    beginning with sx  . Anxiety   . Arthritis   . ASTHMA 11/16/2006   sinusitis-    hx ?yeast patch/white patch on vocal cord as per [facility] 02/25/11-   . Atrial fibrillation ([place]) 11/16/2006   remote CHF related to atrial fib with rapid ventricular response over 25 yrs per office note,  . Cancer [facility]) 2002   prostate cancer  . Chronic systolic CHF (congestive heart failure) (HCC)    echo (10/02/12): EF 69-62%, grade 1 diastolic dysfunction, trivial AI, mild MR, mild RVE  . COLONIC POLYPS, ADENOMATOUS, HX OF 02/14/2008  . CORONARY ARTERY DISEASE 11/16/2006   LHC (07/2011): LAD with luminal irregularities, proximal circumflex 50%, EF 25%  . Depression   . DIVERTICULOSIS, COLON 02/14/2008  . ESOPHAGEAL STRICTURE 01/28/2008  . GERD 02/14/2008  . Headache(784.0)   . HYPERGLYCEMIA 11/16/2006  . HYPERLIPIDEMIA 02/14/2008  . HYPERTENSION 11/16/2006  . ICD (implantable cardiac defibrillator) in place   . INSOMNIA-SLEEP DISORDER-UNSPEC 02/11/2009  . Lesion of vocal  cord    bx begine  . Myocardial infarction ([place]) 1993  . NICM (nonischemic cardiomyopathy) ([place])   . OBSTRUCTIVE SLEEP APNEA 11/10/2008  . 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 Pacemaker    St. Jude  . Pneumonia    hx of  . PROSTATE CANCER, HX OF 02/25/2000  . SLEEP APNEA 10/03/2009   LOV Dr [name] 12/12 in EPIC    Moderate per patient- settings ?6/last sleep study years ago  . Sleep apnea   . Stroke ([place])    [place]ia  . Symptomatic bradycardia, secondary to sinus node dysfunction 07/08/2011   s/p [facility] Scientific PPM by Dr Sallyanne Kuster, upgraded to [employer] ICD 12/13 by Dr Rayann Heman (SJM)  . TRANSIENT ISCHEMIC ATTACKS, HX OF 11/16/2006  . Type 2 diabetes mellitus with complication, without long-term current use of insulin ([place]) 11/19/2016    Past Surgical History:  Procedure Laterality Date  . BACK SURGERY    . BI-VENTRICULAR IMPLANTABLE CARDIOVERTER DEFIBRILLATOR UPGRADE N/A 01/16/2012   Procedure: BI-VENTRICULAR IMPLANTABLE CARDIOVERTER DEFIBRILLATOR UPGRADE;  Surgeon: Thompson Grayer, MD;  Location: [facility] CATH LAB;  Service: Cardiovascular;  Laterality: N/A;  . CARDIAC CATHETERIZATION     several  . CARDIAC DEFIBRILLATOR PLACEMENT  01/16/12   Upgrade to a biventricular SJM ICD by DR Allred  . COLONOSCOPY    . ESOPHAGOGASTRODUODENOSCOPY     with dilitation  . INSERT / REPLACE /  REMOVE PACEMAKER    . KNEE ARTHROSCOPY     2 surgeries right and 1 left  . LEFT AND RIGHT HEART CATHETERIZATION WITH CORONARY ANGIOGRAM N/A 08/18/2011   Procedure: LEFT AND RIGHT HEART CATHETERIZATION WITH CORONARY ANGIOGRAM;  Surgeon: [name], MD;  Location: [facility] CATH LAB;  Service: Cardiovascular;  Laterality: N/A;  . LUMBAR LAMINECTOMY/DECOMPRESSION MICRODISCECTOMY  03/02/2011   Procedure: LUMBAR LAMINECTOMY/DECOMPRESSION MICRODISCECTOMY;  Surgeon: [name], MD;  Location: [facility] ORS;  Service: Orthopedics;  Laterality: N/A;  Decompression Lumbar 4 - Lumbar(X-Ray)  . PACEMAKER INSERTION  06/2011   [place]  Scientific PPM implanted by Dr Sallyanne Kuster  . PERMANENT PACEMAKER INSERTION Left 07/08/2011   Procedure: PERMANENT PACEMAKER INSERTION;  Surgeon: [name], MD;  Location: [facility] CATH LAB;  Service: Cardiovascular;  Laterality: Left;  . PROSTATE SURGERY  2/02   prostate cancer  . TONSILECTOMY, ADENOIDECTOMY, BILATERAL MYRINGOTOMY AND TUBES    . TONSILLECTOMY  as child  . TOTAL KNEE ARTHROPLASTY Left 06/04/2012   Procedure: TOTAL KNEE ARTHROPLASTY;  Surgeon: [name], MD;  Location: [facility] ORS;  Service: Orthopedics;  Laterality: Left;  . UVULOPALATOPHARYNGOPLASTY  2013  . vocal cord lesion bx      There were no vitals filed for this visit.  Subjective Assessment - 04/10/17 1448    Subjective  Pt reported no pain, no medication changes, and no falls. Pt also stated he had "no memory of any home exercise program". Also, stated he is "not using a cane, but he may have one at home from his previous knee surgeries."      Pertinent History  Pt goes by "Corey [name]"     Limitations  House hold activities;Walking    Patient Stated Goals  "I want to be able to walk a straight line and not fall down.  I want to stand up straight."     Currently in Pain?  No/denies        [facility] Adult PT Treatment/Exercise - 04/10/17 1752      Transfers   Transfers  Sit to Stand;Stand to Sit    Sit to Stand  5: Supervision    Stand to Sit  5: Supervision    Stand to Sit Details  x 5 reps during rest breaks during session.       Ambulation/Gait   Ambulation/Gait  Yes    Ambulation/Gait Assistance  5: Supervision    Ambulation/Gait Assistance Details  Pt verbally stated he hadn't been using a cane. But was open to trying it out in our clinic. Pt used SPC on right UE during 345 feet gait trial with initial cues for sequencing. Pt was able to safely use cane, and was open as well to use it after he looked around his home for his own cane. Pt required verbal cue for upright posture.     Ambulation Distance (Feet)  345  Feet plus 50 feet x 2 reps around clinic.     Assistive device  None;Straight cane    Gait Pattern  Step-to pattern;Decreased stride length;Decreased hip/knee flexion - right;Decreased hip/knee flexion - left;Shuffle;Trunk flexed    Ambulation Surface  Level;Indoor      High Level Balance   High Level Balance Activities  Side stepping;Marching forwards;Marching backwards;Tandem walking    High Level Balance Comments  Alongside countertop for single UE support to intermediate single UE support pt performed x 3 reps of each with initial verbal and demo cues: Marching forward and backwards, tandem walking forward and backwards, side  stepping left and right, sidestepping with green theraband tied above knees.       Neuro Re-ed    Neuro Re-ed Details   Pt did require min guard to supervision assitance during high level balance activities due to occasional swaying. Pt able to correct swaying by restarting task being performed. During marching, pt required extra verbal and tactile cues for high marching and holding 3 sec by touching SPTA hand when marching up.       Knee/Hip Exercises: Stretches   Active Hamstring Stretch  Both;2 reps;30 seconds;Other (comment)    Active Hamstring Stretch Limitations  Performed active seated hamstring stretch bilaterally for 30 sec holds x 2 reps. Pt required verbal and demo cues.     Quad Stretch  2 reps;30 seconds;Both standing, performed at stairs, foot on second step.    Quad Stretch Limitations  Pt required verbal and demo cues for performance of task. Pt used B UE support on stair rails for upright posture.       Knee/Hip Exercises: Standing   Hip Abduction  Stengthening;Both;15 reps    Abduction Limitations  Performed with green tband above knees. Performed at [place] with B UE assist. Pt required verbal and demo cues to correct lateral trunk lean compensation.       Ankle Exercises: Stretches   Gastroc Stretch  30 seconds;2 reps;Other (comment) In  standing, performed beside countertop, foot DF    Gastroc Stretch Limitations  pt used B UE on countertop during stretch. Required verbal and demo cues for task performance.          PT Short Term Goals - 03/30/17 2011      PT SHORT TERM GOAL #1   Title  Pt will initiate HEP in order to indicate improved functional mobility and decreased fall risk.  (Target Date: 04/22/17)    Time  4    Period  Weeks    Status  New      PT SHORT TERM GOAL #2   Title  Will assess gait speed and set appropriate LTG.     Baseline  3.36 ft/sec without AD on 03/30/17    Time  4    Period  Weeks    Status  Achieved      PT SHORT TERM GOAL #3   Title  Will assess BERG and improve score 4 points from baseline in order to indicate decreased fall risk.      Baseline  45/56 on 03/30/17    Time  4    Period  Weeks    Status  New      PT SHORT TERM GOAL #4   Title  Will assess varying AD as appropriate to decrease fall risk.      Baseline  assessed SPC on 03/30/17, will continue to address    Time  4    Period  Weeks    Status  New      PT SHORT TERM GOAL #5   Title  Pt will perform 5TSS <20 secs without UE support in order to indicate improved functional strength.      Time  4    Period  Weeks    Status  New        PT Long Term Goals - 03/23/17 2021      PT LONG TERM GOAL #1   Title  Pt will be independent with HEP (with assist from wife as needed) in order to indicate improved functional mobility and decreased fall  risk.  (Target Date: 05/22/17)    Time  8    Period  Weeks    Status  New    Target Date  05/22/17      PT LONG TERM GOAL #2   Title  Pt will improve BERG balance score by 8 points from baseline in order to indicate decreased fall risk.      Time  8    Period  Weeks    Status  New      PT LONG TERM GOAL #3   Title  Pt will perform 5TSS <17 secs without UE support in order to indicate improved functional strength.      Time  8    Period  Weeks    Status  New      PT LONG TERM  GOAL #4   Title  Pt will ambulate up to 1000' over unlevel paved outdoor surfaces at mod I level with LRAD in order to indicate safe community negotiation.      Time  8    Period  Weeks    Status  New      PT LONG TERM GOAL #5   Title  Pt will verbalize initiation of community fitness program in order to maintain gains made in therapy.      Time  8    Period  Weeks    Status  New        Plan - 04/10/17 1811    Clinical Impression Statement  Today's skilled PT session focused on bilateral lower extremity stretching, increased gait distance with use of cane, bilateral LE strengthening, and dynamic balance drills with various stances. Pt verbalized he currently is not HEP complaint during today's session. But overall tolerated today's increased activities well. Pt is making progression towards STG's but would benefit from skilled PT interventions towards reaching unmet PT goals.     History and Personal Factors relevant to plan of care:  see above    Clinical Presentation  Evolving    Clinical Decision Making  Moderate    Clinical Impairments Affecting Rehab Potential  ETOH abuse and underlying depression     PT Frequency  2x / week    PT Duration  8 weeks    PT Treatment/Interventions  ADLs/Self Care Home Management;DME Instruction;Gait training;Stair training;Functional mobility training;Therapeutic activities;Therapeutic exercise;Balance training;Neuromuscular re-education;Patient/family education;Orthotic Fit/Training;Vestibular;Passive range of motion    PT Next Visit Plan  Check compliance with HEP,  LE stretching, balance-compliant surfaces, narrow BOS, follow up on gait with SPC as able (educated to use in community)    Oncologist with Plan of Care  Patient       Patient will benefit from skilled therapeutic intervention in order to improve the following deficits and impairments:  Abnormal gait, Decreased activity tolerance, Decreased balance, Decreased cognition, Decreased  endurance, Decreased knowledge of use of DME, Decreased mobility, Decreased strength, Difficulty walking, Impaired perceived functional ability, Impaired flexibility, Postural dysfunction  Visit Diagnosis: Unsteadiness on feet  Repeated falls  Muscle weakness (generalized)     Problem List Patient Active Problem List   Diagnosis Date Noted  . Pneumonia of right lower lobe due to infectious organism ([place]) 03/25/2017  . Type 2 diabetes mellitus with complication, without long-term current use of insulin ([place]) 11/19/2016  . Chronic right-sided thoracic back pain 08/15/2016  . DDD (degenerative disc disease), thoracic 06/08/2016  . Depression with anxiety 05/31/2016  . 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 Bronchiectasis without acute exacerbation (  [place]) 12/31/2015  . Chronic alcoholic hepatitis 22/29/7989  . Obstructive chronic bronchitis without exacerbation ([place]) 11/12/2015  . Spinal stenosis in cervical region 10/02/2015  . Gallbladder sludge 03/02/2015  . Other iron deficiency anemias 11/13/2013  . B12 deficiency anemia 11/13/2013  . Encounter for therapeutic drug monitoring 02/25/2013  . Long term (current) use of anticoagulants 05/12/2012  . [employer] 11/01/2011  . Chronic systolic heart failure ([place]) 08/28/2011  . Vocal cord nodule 05/19/2011  . Lumbar spinal stenosis 03/03/2011  . Seasonal and perennial allergic rhinitis 04/15/2009  . Insomnia 02/11/2009  . Obstructive sleep apnea 11/10/2008  . Hyperlipidemia with target LDL less than 100 02/14/2008  . GERD 02/14/2008  . ESOPHAGEAL STRICTURE 01/28/2008  . Essential hypertension 11/16/2006  . Coronary atherosclerosis 11/16/2006  . Atrial fibrillation ([place]) 11/16/2006    Dollene Primrose 04/10/2017, 6:16 PM  [facility] [address] [place], [place], [address] Phone: [phone]   Fax:  [phone]  Name: Corey [name] MRN: [id number] Date of Birth: 14-Oct-1935

## 2017-04-10 NOTE — Patient Outreach (Signed)
Opened in error

## 2017-04-10 NOTE — Patient Outreach (Signed)
[facility]) Care Management  04/10/2017  Corey [name] April 26, 1935 [id number]   Patient has declined [facility] services on 04/07/17. Closed pharmacy episode.   Bertis Ruddy, [facility]

## 2017-04-12 ENCOUNTER — Encounter: Payer: Self-pay | Admitting: Physical Therapy

## 2017-04-12 ENCOUNTER — Ambulatory Visit: Payer: Medicare Other | Admitting: Physical Therapy

## 2017-04-12 DIAGNOSIS — M6281 Muscle weakness (generalized): Secondary | ICD-10-CM | POA: Diagnosis not present

## 2017-04-12 DIAGNOSIS — R2681 Unsteadiness on feet: Secondary | ICD-10-CM | POA: Diagnosis not present

## 2017-04-12 DIAGNOSIS — R296 Repeated falls: Secondary | ICD-10-CM

## 2017-04-12 DIAGNOSIS — R2689 Other abnormalities of gait and mobility: Secondary | ICD-10-CM | POA: Diagnosis not present

## 2017-04-12 NOTE — Therapy (Signed)
[facility] [address] [place], [place], [address] Phone: [phone]   Fax:  [phone]  Physical Therapy Treatment  Patient Details  Name: Corey [name] MRN: [id number] Date of Birth: 1935-09-06 Referring Provider: Scarlette Calico, MD   Encounter Date: 04/12/2017  PT End of Session - 04/12/17 1149    Visit Number  4    Number of Visits  17    Date for PT Re-Evaluation  05/22/17    Authorization Type  Medicare    PT Start Time  0932    PT Stop Time  1010    PT Time Calculation (min)  38 min    Equipment Utilized During Treatment  Gait belt    Activity Tolerance  Treatment limited secondary to medical complications (Comment)    Behavior During Therapy  [facility] for tasks assessed/performed       Past Medical History:  Diagnosis Date  . Alcohol abuse, episodic 05/19/2008  . ALLERGIC RHINITIS 04/15/2009  . Allergy    YEAR ROUND,TAKE SHOTS  . Alzheimer's dementia    beginning with sx  . Anxiety   . Arthritis   . ASTHMA 11/16/2006   sinusitis-    hx ?yeast patch/white patch on vocal cord as per [facility] 02/25/11-   . Atrial fibrillation ([place]) 11/16/2006   remote CHF related to atrial fib with rapid ventricular response over 25 yrs per office note,  . Cancer [facility]) 2002   prostate cancer  . Chronic systolic CHF (congestive heart failure) (HCC)    echo (10/02/12): EF 09-38%, grade 1 diastolic dysfunction, trivial AI, mild MR, mild RVE  . COLONIC POLYPS, ADENOMATOUS, HX OF 02/14/2008  . CORONARY ARTERY DISEASE 11/16/2006   LHC (07/2011): LAD with luminal irregularities, proximal circumflex 50%, EF 25%  . Depression   . DIVERTICULOSIS, COLON 02/14/2008  . ESOPHAGEAL STRICTURE 01/28/2008  . GERD 02/14/2008  . Headache(784.0)   . HYPERGLYCEMIA 11/16/2006  . HYPERLIPIDEMIA 02/14/2008  . HYPERTENSION 11/16/2006  . ICD (implantable cardiac defibrillator) in place   . INSOMNIA-SLEEP DISORDER-UNSPEC 02/11/2009  . Lesion of vocal  cord    bx begine  . Myocardial infarction ([place]) 1993  . NICM (nonischemic cardiomyopathy) ([place])   . OBSTRUCTIVE SLEEP APNEA 11/10/2008  . 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 Pacemaker    St. Jude  . Pneumonia    hx of  . PROSTATE CANCER, HX OF 02/25/2000  . SLEEP APNEA 10/03/2009   LOV Dr [name] 12/12 in EPIC    Moderate per patient- settings ?6/last sleep study years ago  . Sleep apnea   . Stroke ([place])    Tia  . Symptomatic bradycardia, secondary to sinus node dysfunction 07/08/2011   s/p [facility] Scientific PPM by Dr Sallyanne Kuster, upgraded to [place] ICD 12/13 by Dr Rayann Heman (SJM)  . TRANSIENT ISCHEMIC ATTACKS, HX OF 11/16/2006  . Type 2 diabetes mellitus with complication, without long-term current use of insulin ([place]) 11/19/2016    Past Surgical History:  Procedure Laterality Date  . BACK SURGERY    . BI-VENTRICULAR IMPLANTABLE CARDIOVERTER DEFIBRILLATOR UPGRADE N/A 01/16/2012   Procedure: BI-VENTRICULAR IMPLANTABLE CARDIOVERTER DEFIBRILLATOR UPGRADE;  Surgeon: Thompson Grayer, MD;  Location: [facility] C[facility] LAB;  Service: Cardiovascular;  Laterality: N/A;  . CARDIAC CATHETERIZATION     several  . CARDIAC DEFIBRILLATOR PLACEMENT  01/16/12   Upgrade to a biventricular SJM ICD by DR Allred  . COLONOSCOPY    . ESOPHAGOGASTRODUODENOSCOPY     with dilitation  . INSERT / REPLACE /  REMOVE PACEMAKER    . KNEE ARTHROSCOPY     2 surgeries right and 1 left  . LEFT AND RIGHT HEART CATHETERIZATION WITH CORONARY ANGIOGRAM N/A 08/18/2011   Procedure: LEFT AND RIGHT HEART CATHETERIZATION WITH CORONARY ANGIOGRAM;  Surgeon: [name], MD;  Location: [facility] CATH LAB;  Service: Cardiovascular;  Laterality: N/A;  . LUMBAR LAMINECTOMY/DECOMPRESSION MICRODISCECTOMY  03/02/2011   Procedure: LUMBAR LAMINECTOMY/DECOMPRESSION MICRODISCECTOMY;  Surgeon: [name], MD;  Location: [facility] ORS;  Service: Orthopedics;  Laterality: N/A;  Decompression Lumbar 4 - Lumbar(X-Ray)  . PACEMAKER INSERTION  06/2011   [place]  Scientific PPM implanted by Dr Sallyanne Kuster  . PERMANENT PACEMAKER INSERTION Left 07/08/2011   Procedure: PERMANENT PACEMAKER INSERTION;  Surgeon: [name], MD;  Location: [facility] CATH LAB;  Service: Cardiovascular;  Laterality: Left;  . PROSTATE SURGERY  2/02   prostate cancer  . TONSILECTOMY, ADENOIDECTOMY, BILATERAL MYRINGOTOMY AND TUBES    . TONSILLECTOMY  as child  . TOTAL KNEE ARTHROPLASTY Left 06/04/2012   Procedure: TOTAL KNEE ARTHROPLASTY;  Surgeon: [name], MD;  Location: [facility] ORS;  Service: Orthopedics;  Laterality: Left;  . UVULOPALATOPHARYNGOPLASTY  2013  . vocal cord lesion bx      There were no vitals filed for this visit.  Subjective Assessment - 04/12/17 0934    Subjective  Pt reported no pain, no medication changes, and no falls. Pt also stated he "remembers his home exercise program but hasn't had time to do it." Also stated he "I am not going to use a cane".       Patient is accompained by:  Family member    Pertinent History  Pt goes by "[employer] hold activities;Walking    Patient Stated Goals  "I want to be able to walk a straight line and not fall down.  I want to stand up straight."         [facility] Adult PT Treatment/Exercise - 04/12/17 1157      Transfers   Transfers  Sit to Stand;Stand to Sit    Sit to Stand  5: Supervision    Sit to Stand Details  Verbal cues for technique    Sit to Stand Details (indicate cue type and reason)  Strengthening    Stand to Sit  5: Supervision    Stand to Sit Details (indicate cue type and reason)  Verbal cues for technique    Stand to Sit Details  Performed 2 x 10 reps of sit to stands/stand to sits without UE assistance. Provided verbal and demonstration cues for proper foot placement, and both feet closer to body, and slow eccentric lowering during task.       Ambulation/Gait   Ambulation/Gait  Yes    Ambulation/Gait Assistance  5: Supervision    Ambulation/Gait Assistance Details  Ambulation around  clinic was performed without use of cane today.     Ambulation Distance (Feet)  70 Feet    Assistive device  None    Gait Pattern  Step-to pattern;Decreased stride length;Decreased hip/knee flexion - right;Decreased hip/knee flexion - left;Shuffle;Trunk flexed    Ambulation Surface  Level;Indoor      Knee/Hip Exercises: Stretches   Active Hamstring Stretch  Both;2 reps;30 seconds;Other (comment)    Active Hamstring Stretch Limitations  Performed active seated hamstring stretch bilaterally for 30 sec holds x 2 reps. Pt required verbal and demo cues.     Quad Stretch  2 reps;30 seconds;Both    Sports administrator Limitations  Pt required verbal and demo cues for performance of task. Pt used B UE support on stair rails for upright posture.     Gastroc Stretch  Both;2 reps;30 seconds;Other (comment) In standing, at first stair step;        Balance Exercises - 04/12/17 1234      Balance Exercises: Standing   Balance Beam  Standing on blue foam: Standing marches, stepping forward with contralateral single leg on foam, step downs with contralateral single leg on foam x 10 reps each.         PT Education - 04/12/17 1148    Education provided  Yes    Education Details  Kasandra Knudsen usage in the community for safety. Pt verbally stated he would not use a cane.     Person(s) Educated  Patient    Methods  Explanation    Comprehension  Verbalized understanding       PT Short Term Goals - 03/30/17 2011      PT SHORT TERM GOAL #1   Title  Pt will initiate HEP in order to indicate improved functional mobility and decreased fall risk.  (Target Date: 04/22/17)    Time  4    Period  Weeks    Status  New      PT SHORT TERM GOAL #2   Title  Will assess gait speed and set appropriate LTG.     Baseline  3.36 ft/sec without AD on 03/30/17    Time  4    Period  Weeks    Status  Achieved      PT SHORT TERM GOAL #3   Title  Will assess BERG and improve score 4 points from baseline in order to indicate decreased  fall risk.      Baseline  45/56 on 03/30/17    Time  4    Period  Weeks    Status  New      PT SHORT TERM GOAL #4   Title  Will assess varying AD as appropriate to decrease fall risk.      Baseline  assessed SPC on 03/30/17, will continue to address    Time  4    Period  Weeks    Status  New      PT SHORT TERM GOAL #5   Title  Pt will perform 5TSS <20 secs without UE support in order to indicate improved functional strength.      Time  4    Period  Weeks    Status  New        PT Long Term Goals - 03/23/17 2021      PT LONG TERM GOAL #1   Title  Pt will be independent with HEP (with assist from wife as needed) in order to indicate improved functional mobility and decreased fall risk.  (Target Date: 05/22/17)    Time  8    Period  Weeks    Status  New    Target Date  05/22/17      PT LONG TERM GOAL #2   Title  Pt will improve BERG balance score by 8 points from baseline in order to indicate decreased fall risk.      Time  8    Period  Weeks    Status  New      PT LONG TERM GOAL #3   Title  Pt will perform 5TSS <17 secs without UE support in order to indicate improved functional strength.  Time  8    Period  Weeks    Status  New      PT LONG TERM GOAL #4   Title  Pt will ambulate up to 1000' over unlevel paved outdoor surfaces at mod I level with LRAD in order to indicate safe community negotiation.      Time  8    Period  Weeks    Status  New      PT LONG TERM GOAL #5   Title  Pt will verbalize initiation of community fitness program in order to maintain gains made in therapy.      Time  8    Period  Weeks    Status  New         Plan - 04/12/17 1151    Clinical Impression Statement  Today's skilled session focused on improving static balance while standing on uneven surfaces, dynamic balance drills with stepping strategies, and bilateral LE strengthening exercises. Pt verbalized today that he is not currently HEP compliant and is not interested in using a cane  for safety purposes in the community. Pt is making progress towards STG's and would benefit from continued PT interventions to accomplish unmet PT goals.      History and Personal Factors relevant to plan of care:  see above    Clinical Presentation  Evolving    Clinical Decision Making  Moderate    Rehab Potential  Fair    Clinical Impairments Affecting Rehab Potential  ETOH abuse and underlying depression     PT Frequency  2x / week    PT Duration  8 weeks    PT Treatment/Interventions  ADLs/Self Care Home Management;DME Instruction;Gait training;Stair training;Functional mobility training;Therapeutic activities;Therapeutic exercise;Balance training;Neuromuscular re-education;Patient/family education;Orthotic Fit/Training;Vestibular;Passive range of motion    PT Next Visit Plan  Check compliance with HEP,  LE stretching, balance on airex pad, narrow BOS with head turns, tandem stance balance.     Consulted and Agree with Plan of Care  Patient       Patient will benefit from skilled therapeutic intervention in order to improve the following deficits and impairments:  Abnormal gait, Decreased activity tolerance, Decreased balance, Decreased cognition, Decreased endurance, Decreased knowledge of use of DME, Decreased mobility, Decreased strength, Difficulty walking, Impaired perceived functional ability, Impaired flexibility, Postural dysfunction  Visit Diagnosis: Unsteadiness on feet  Muscle weakness (generalized)  Repeated falls     Problem List Patient Active Problem List   Diagnosis Date Noted  . Pneumonia of right lower lobe due to infectious organism ([place]) 03/25/2017  . Type 2 diabetes mellitus with complication, without long-term current use of insulin ([place]) 11/19/2016  . Chronic right-sided thoracic back pain 08/15/2016  . DDD (degenerative disc disease), thoracic 06/08/2016  . Depression with anxiety 05/31/2016  . 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  Bronchiectasis without acute exacerbation ([place]) 12/31/2015  . Chronic alcoholic hepatitis 25/85/2778  . Obstructive chronic bronchitis without exacerbation ([place]) 11/12/2015  . Spinal stenosis in cervical region 10/02/2015  . Gallbladder sludge 03/02/2015  . Other iron deficiency anemias 11/13/2013  . B12 deficiency anemia 11/13/2013  . Encounter for therapeutic drug monitoring 02/25/2013  . Long term (current) use of anticoagulants 05/12/2012  . [employer] 11/01/2011  . Chronic systolic heart failure ([place]) 08/28/2011  . Vocal cord nodule 05/19/2011  . Lumbar spinal stenosis 03/03/2011  . Seasonal and perennial allergic rhinitis 04/15/2009  . Insomnia 02/11/2009  . Obstructive sleep apnea 11/10/2008  . Hyperlipidemia with target LDL  less than 100 02/14/2008  . GERD 02/14/2008  . ESOPHAGEAL STRICTURE 01/28/2008  . Essential hypertension 11/16/2006  . Coronary atherosclerosis 11/16/2006  . Atrial fibrillation ([place]) 11/16/2006    Carlena Sax, SPTA  04/12/2017, 12:36 PM  [facility] [address] [place], [place], [address] Phone: [phone]   Fax:  [phone]  Name: Corey [name] MRN: [id number] Date of Birth: [birth year]  This note has been reviewed and edited by supervising CI.  Willow Ora, PTA, [facility] [address], [place], [place] [address] [phone] 04/12/17, 2:45 PM

## 2017-04-17 ENCOUNTER — Telehealth: Payer: Self-pay | Admitting: Cardiology

## 2017-04-17 NOTE — Telephone Encounter (Signed)
LMOVM reminding pt to send remote transmission.   

## 2017-04-18 ENCOUNTER — Ambulatory Visit: Payer: Medicare Other | Admitting: Physical Therapy

## 2017-04-18 ENCOUNTER — Encounter: Payer: Self-pay | Admitting: Physical Therapy

## 2017-04-18 DIAGNOSIS — M6281 Muscle weakness (generalized): Secondary | ICD-10-CM

## 2017-04-18 DIAGNOSIS — R2689 Other abnormalities of gait and mobility: Secondary | ICD-10-CM

## 2017-04-18 DIAGNOSIS — R2681 Unsteadiness on feet: Secondary | ICD-10-CM

## 2017-04-18 DIAGNOSIS — R296 Repeated falls: Secondary | ICD-10-CM | POA: Diagnosis not present

## 2017-04-18 NOTE — Therapy (Signed)
[facility] [address] [place], [place], [address] Phone: [phone]   Fax:  [phone]  Physical Therapy Treatment  Patient Details  Name: Corey [name] MRN: [id number] Date of Birth: 1935/08/01 Referring Provider: Scarlette Calico, MD   Encounter Date: 04/18/2017  PT End of Session - 04/18/17 1236    Visit Number  5    Number of Visits  17    Date for PT Re-Evaluation  05/22/17    Authorization Type  Medicare    PT Start Time  1020    PT Stop Time  1058    PT Time Calculation (min)  38 min    Equipment Utilized During Treatment  Gait belt    Activity Tolerance  Treatment limited secondary to medical complications (Comment)    Behavior During Therapy  [facility] for tasks assessed/performed       Past Medical History:  Diagnosis Date  . Alcohol abuse, episodic 05/19/2008  . ALLERGIC RHINITIS 04/15/2009  . Allergy    YEAR ROUND,TAKE SHOTS  . Alzheimer's dementia    beginning with sx  . Anxiety   . Arthritis   . ASTHMA 11/16/2006   sinusitis-    hx ?yeast patch/white patch on vocal cord as per [facility] 02/25/11-   . Atrial fibrillation ([place]) 11/16/2006   remote CHF related to atrial fib with rapid ventricular response over 25 yrs per office note,  . Cancer [facility]) 2002   prostate cancer  . Chronic systolic CHF (congestive heart failure) (HCC)    echo (10/02/12): EF 32-35%, grade 1 diastolic dysfunction, trivial AI, mild MR, mild RVE  . COLONIC POLYPS, ADENOMATOUS, HX OF 02/14/2008  . CORONARY ARTERY DISEASE 11/16/2006   LHC (07/2011): LAD with luminal irregularities, proximal circumflex 50%, EF 25%  . Depression   . DIVERTICULOSIS, COLON 02/14/2008  . ESOPHAGEAL STRICTURE 01/28/2008  . GERD 02/14/2008  . Headache(784.0)   . HYPERGLYCEMIA 11/16/2006  . HYPERLIPIDEMIA 02/14/2008  . HYPERTENSION 11/16/2006  . ICD (implantable cardiac defibrillator) in place   . INSOMNIA-SLEEP DISORDER-UNSPEC 02/11/2009  . Lesion of vocal  cord    bx begine  . Myocardial infarction ([place]) 1993  . NICM (nonischemic cardiomyopathy) ([place])   . OBSTRUCTIVE SLEEP APNEA 11/10/2008  . 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 Pacemaker    St. Jude  . Pneumonia    hx of  . PROSTATE CANCER, HX OF 02/25/2000  . SLEEP APNEA 10/03/2009   LOV Dr [name] 12/12 in EPIC    Moderate per patient- settings ?6/last sleep study years ago  . Sleep apnea   . Stroke ([place])    Tia  . Symptomatic bradycardia, secondary to sinus node dysfunction 07/08/2011   s/p [facility] Scientific PPM by Dr Sallyanne Kuster, upgraded to [place] ICD 12/13 by Dr Rayann Heman (SJM)  . TRANSIENT ISCHEMIC ATTACKS, HX OF 11/16/2006  . Type 2 diabetes mellitus with complication, without long-term current use of insulin ([place]) 11/19/2016    Past Surgical History:  Procedure Laterality Date  . BACK SURGERY    . BI-VENTRICULAR IMPLANTABLE CARDIOVERTER DEFIBRILLATOR UPGRADE N/A 01/16/2012   Procedure: BI-VENTRICULAR IMPLANTABLE CARDIOVERTER DEFIBRILLATOR UPGRADE;  Surgeon: Thompson Grayer, MD;  Location: [facility] CATH LAB;  Service: Cardiovascular;  Laterality: N/A;  . CARDIAC CATHETERIZATION     several  . CARDIAC DEFIBRILLATOR PLACEMENT  01/16/12   Upgrade to a biventricular SJM ICD by DR Allred  . COLONOSCOPY    . ESOPHAGOGASTRODUODENOSCOPY     with dilitation  . INSERT / REPLACE /  REMOVE PACEMAKER    . KNEE ARTHROSCOPY     2 surgeries right and 1 left  . LEFT AND RIGHT HEART CATHETERIZATION WITH CORONARY ANGIOGRAM N/A 08/18/2011   Procedure: LEFT AND RIGHT HEART CATHETERIZATION WITH CORONARY ANGIOGRAM;  Surgeon: [name], MD;  Location: [facility] CATH LAB;  Service: Cardiovascular;  Laterality: N/A;  . LUMBAR LAMINECTOMY/DECOMPRESSION MICRODISCECTOMY  03/02/2011   Procedure: LUMBAR LAMINECTOMY/DECOMPRESSION MICRODISCECTOMY;  Surgeon: [name], MD;  Location: [facility] ORS;  Service: Orthopedics;  Laterality: N/A;  Decompression Lumbar 4 - Lumbar(X-Ray)  . PACEMAKER INSERTION  06/2011   [place]  [employer] PPM implanted by Dr Sallyanne Kuster  . PERMANENT PACEMAKER INSERTION Left 07/08/2011   Procedure: PERMANENT PACEMAKER INSERTION;  Surgeon: [name], MD;  Location: [facility] CATH LAB;  Service: Cardiovascular;  Laterality: Left;  . PROSTATE SURGERY  2/02   prostate cancer  . TONSILECTOMY, ADENOIDECTOMY, BILATERAL MYRINGOTOMY AND TUBES    . TONSILLECTOMY  as child  . TOTAL KNEE ARTHROPLASTY Left 06/04/2012   Procedure: TOTAL KNEE ARTHROPLASTY;  Surgeon: [name], MD;  Location: [facility] ORS;  Service: Orthopedics;  Laterality: Left;  . UVULOPALATOPHARYNGOPLASTY  2013  . vocal cord lesion bx      There were no vitals filed for this visit.  Subjective Assessment - 04/18/17 1021    Subjective  Pt reported no pain, no medication changes, and no falls. Pt also stated "I did do some of the home exercise program but not all of them." Also stated, "I played with grandkids and walked around [place] this weekend with no issues.."    Patient is accompained by:  Family member    Pertinent History  Pt goes by "[employer] hold activities;Walking    Patient Stated Goals  "I want to be able to walk a straight line and not fall down.  I want to stand up straight."     Currently in Pain?  No/denies       [facility] Adult PT Treatment/Exercise - 04/18/17 1232      Transfers   Transfers  Sit to Stand;Stand to Sit    Sit to Stand  5: Supervision    Sit to Stand Details  Verbal cues for technique    Sit to Stand Details (indicate cue type and reason)  For strengthening    Stand to Sit  5: Supervision    Stand to Sit Details (indicate cue type and reason)  Verbal cues for technique    Stand to Sit Details  2 x 10 reps without hand assistance.       Ambulation/Gait   Ambulation/Gait  Yes    Ambulation/Gait Assistance  5: Supervision    Ambulation/Gait Assistance Details  ambulation around clinic without use of cane    Ambulation Distance (Feet)  70 Feet plus 50 around clinic     Assistive device  None    Gait Pattern  Step-to pattern;Decreased stride length;Decreased hip/knee flexion - right;Decreased hip/knee flexion - left;Shuffle;Trunk flexed    Ambulation Surface  Level;Indoor      Knee/Hip Exercises: Standing   Lateral Step Up  Both;10 reps;Step Height: 6";1 set Without UE assistance    Forward Step Up  Both;1 set;10 reps;Step Height: 6" Without UE assistance    Step Down  1 set;Both;10 reps;Step Height: 6" With single UE assistance holding rail.     Other Standing Knee Exercises  Alternating step up taps (6 inch step) x10 reps each bilaterally without UE assistance. Pt did  require initial verbal and demonstration cues for performance of task. Pt required supervision assistance only for each stepping exercise perfomed at stairs.        Balance Exercises - 04/18/17 1235      Balance Exercises: Standing   Standing Eyes Closed  Narrow base of support (BOS);Wide (BOA);Head turns;Foam/compliant surface;30 secs;2 reps    Tandem Stance  Eyes open;Eyes closed;Foam/compliant surface;Intermittent upper extremity support;2 reps;30 secs    Rockerboard  Anterior/posterior;Lateral;EO;10 reps;Intermittent UE support;UE support    Other Standing Exercises  Weight shifting evenly and reaching forward (5-10 inches) to touch target without UE assistance performed bilaterally with each arm reaching x 10 reps. Followed by turning over shoulder and weight shifting x 10 reps bilaterally.       Balance Exercises: Standing   Standing Eyes Closed Limitations  Standing on airex pad with a chair placed in front of patient for safety and patient using two finger UE touch to regain balance; SPTA providing minimal assistance. EC wide stance, EC wide stance with head turns left/right and the up/down, EC narrow BOS, EC narrow BOS with head turns up/down and then left and right for 30 sec hold x 2 reps each. Added diagonal head turns for 2 rep x 30 secs each with EO and EC. Pt did have posterior  leaning/leaning which required holding on the chair to restart exercises.     Tandem Stance Limitations  While standing on airex pad, began with tandem stance eyes open bilaterally x 2 reps for 30 each. Progressed to tandem stance with eyes closed with head turns up and down, and left and right x 2 reps for 30 sec holds. Pt had swaying in all directions for EC and head turns drills and also required minimal assistance from SPTA to regain balance.     Rebounder Limitations  Pt required holding parallel bars with BUE during rockerboard exercises: EO with anterior/posterior weight shifting x 10 reps, lateral weight shifting x 10 reps, and open stance EO and progressing to EC for 30 sec x 2 reps. Pt was unable to perform nor feel comfortable without holding bars.           PT Short Term Goals - 03/30/17 2011      PT SHORT TERM GOAL #1   Title  Pt will initiate HEP in order to indicate improved functional mobility and decreased fall risk.  (Target Date: 04/22/17)    Time  4    Period  Weeks    Status  New      PT SHORT TERM GOAL #2   Title  Will assess gait speed and set appropriate LTG.     Baseline  3.36 ft/sec without AD on 03/30/17    Time  4    Period  Weeks    Status  Achieved      PT SHORT TERM GOAL #3   Title  Will assess BERG and improve score 4 points from baseline in order to indicate decreased fall risk.      Baseline  45/56 on 03/30/17    Time  4    Period  Weeks    Status  New      PT SHORT TERM GOAL #4   Title  Will assess varying AD as appropriate to decrease fall risk.      Baseline  assessed SPC on 03/30/17, will continue to address    Time  4    Period  Weeks    Status  New  PT SHORT TERM GOAL #5   Title  Pt will perform 5TSS <20 secs without UE support in order to indicate improved functional strength.      Time  4    Period  Weeks    Status  New        PT Long Term Goals - 03/23/17 2021      PT LONG TERM GOAL #1   Title  Pt will be independent with HEP  (with assist from wife as needed) in order to indicate improved functional mobility and decreased fall risk.  (Target Date: 05/22/17)    Time  8    Period  Weeks    Status  New    Target Date  05/22/17      PT LONG TERM GOAL #2   Title  Pt will improve BERG balance score by 8 points from baseline in order to indicate decreased fall risk.      Time  8    Period  Weeks    Status  New      PT LONG TERM GOAL #3   Title  Pt will perform 5TSS <17 secs without UE support in order to indicate improved functional strength.      Time  8    Period  Weeks    Status  New      PT LONG TERM GOAL #4   Title  Pt will ambulate up to 1000' over unlevel paved outdoor surfaces at mod I level with LRAD in order to indicate safe community negotiation.      Time  8    Period  Weeks    Status  New      PT LONG TERM GOAL #5   Title  Pt will verbalize initiation of community fitness program in order to maintain gains made in therapy.      Time  8    Period  Weeks    Status  New            Plan - 04/18/17 1252    Clinical Impression Statement  Today's session focused on improving static balance while standing on an uneven surface, blocked practice of reaching forward to touch targets outside of patients COG, and bilateral LE strengthening. Pt verbalized today that he is doing some of his HEP but was unable to state which exercises. Also, pt stated verbalized that he does not see the benefit from performing balance exercises after being explained the purpose of balance training. Pt is making slow progress towards STG's and would benefit from skilled PT interventions towards progress towards unmet PT goals.      History and Personal Factors relevant to plan of care:  see above    Clinical Presentation  Evolving    Clinical Decision Making  Moderate    Rehab Potential  Fair    Clinical Impairments Affecting Rehab Potential  ETOH abuse and underlying depression     PT Frequency  2x / week    PT Duration   8 weeks    PT Treatment/Interventions  ADLs/Self Care Home Management;DME Instruction;Gait training;Stair training;Functional mobility training;Therapeutic activities;Therapeutic exercise;Balance training;Neuromuscular re-education;Patient/family education;Orthotic Fit/Training;Vestibular;Passive range of motion    PT Next Visit Plan STGs due 04/22/17   Consulted and Agree with Plan of Care  Patient       Patient will benefit from skilled therapeutic intervention in order to improve the following deficits and impairments:  Abnormal gait, Decreased activity tolerance, Decreased balance, Decreased cognition, Decreased endurance, Decreased  knowledge of use of DME, Decreased mobility, Decreased strength, Difficulty walking, Impaired perceived functional ability, Impaired flexibility, Postural dysfunction  Visit Diagnosis: Unsteadiness on feet  Muscle weakness (generalized)  Other abnormalities of gait and mobility     Problem List Patient Active Problem List   Diagnosis Date Noted  . Pneumonia of right lower lobe due to infectious organism ([place]) 03/25/2017  . Type 2 diabetes mellitus with complication, without long-term current use of insulin ([place]) 11/19/2016  . Chronic right-sided thoracic back pain 08/15/2016  . DDD (degenerative disc disease), thoracic 06/08/2016  . Depression with anxiety 05/31/2016  . 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 Bronchiectasis without acute exacerbation ([place]) 12/31/2015  . Chronic alcoholic hepatitis 32/54/9826  . Obstructive chronic bronchitis without exacerbation ([place]) 11/12/2015  . Spinal stenosis in cervical region 10/02/2015  . Gallbladder sludge 03/02/2015  . Other iron deficiency anemias 11/13/2013  . B12 deficiency anemia 11/13/2013  . Encounter for therapeutic drug monitoring 02/25/2013  . Long term (current) use of anticoagulants 05/12/2012  . [employer] 11/01/2011  . Chronic systolic heart failure ([place]) 08/28/2011   . Vocal cord nodule 05/19/2011  . Lumbar spinal stenosis 03/03/2011  . Seasonal and perennial allergic rhinitis 04/15/2009  . Insomnia 02/11/2009  . Obstructive sleep apnea 11/10/2008  . Hyperlipidemia with target LDL less than 100 02/14/2008  . GERD 02/14/2008  . ESOPHAGEAL STRICTURE 01/28/2008  . Essential hypertension 11/16/2006  . Coronary atherosclerosis 11/16/2006  . Atrial fibrillation ([place]) 11/16/2006    Carlena Sax, SPTA 04/18/2017, 12:59 PM  [facility] [address] [place], [place], [address] Phone: [phone]   Fax:  [phone]  Name: Corey [name] MRN: [id number] Date of Birth: [birth year]  This note has been reviewed and edited by supervising CI. Edited plan for next session.   Willow Ora, PTA, [facility] [address], [place], [place] [address] [phone] 04/18/17, 1:31 PM

## 2017-04-21 ENCOUNTER — Encounter: Payer: Self-pay | Admitting: Rehabilitation

## 2017-04-21 ENCOUNTER — Ambulatory Visit: Payer: Medicare Other | Admitting: Rehabilitation

## 2017-04-21 ENCOUNTER — Emergency Department (HOSPITAL_COMMUNITY)
Admission: EM | Admit: 2017-04-21 | Discharge: 2017-04-21 | Disposition: A | Discharge status: Home / Self Care | Payer: Medicare Other | Attending: Emergency Medicine | Admitting: Emergency Medicine

## 2017-04-21 ENCOUNTER — Other Ambulatory Visit: Payer: Self-pay

## 2017-04-21 ENCOUNTER — Encounter (HOSPITAL_COMMUNITY): Payer: Self-pay | Admitting: Emergency Medicine

## 2017-04-21 ENCOUNTER — Emergency Department (HOSPITAL_COMMUNITY): Payer: Medicare Other

## 2017-04-21 DIAGNOSIS — Y999 Unspecified external cause status: Secondary | ICD-10-CM | POA: Insufficient documentation

## 2017-04-21 DIAGNOSIS — S0990XA Unspecified injury of head, initial encounter: Secondary | ICD-10-CM

## 2017-04-21 DIAGNOSIS — I110 Hypertensive heart disease with heart failure: Secondary | ICD-10-CM | POA: Diagnosis not present

## 2017-04-21 DIAGNOSIS — J45909 Unspecified asthma, uncomplicated: Secondary | ICD-10-CM | POA: Insufficient documentation

## 2017-04-21 DIAGNOSIS — R296 Repeated falls: Secondary | ICD-10-CM

## 2017-04-21 DIAGNOSIS — E119 Type 2 diabetes mellitus without complications: Secondary | ICD-10-CM | POA: Insufficient documentation

## 2017-04-21 DIAGNOSIS — R2681 Unsteadiness on feet: Secondary | ICD-10-CM | POA: Diagnosis not present

## 2017-04-21 DIAGNOSIS — Z7901 Long term (current) use of anticoagulants: Secondary | ICD-10-CM | POA: Diagnosis not present

## 2017-04-21 DIAGNOSIS — S098XXA Other specified injuries of head, initial encounter: Secondary | ICD-10-CM | POA: Diagnosis not present

## 2017-04-21 DIAGNOSIS — Y939 Activity, unspecified: Secondary | ICD-10-CM | POA: Diagnosis not present

## 2017-04-21 DIAGNOSIS — Z79899 Other long term (current) drug therapy: Secondary | ICD-10-CM | POA: Insufficient documentation

## 2017-04-21 DIAGNOSIS — Z7984 Long term (current) use of oral hypoglycemic drugs: Secondary | ICD-10-CM | POA: Diagnosis not present

## 2017-04-21 DIAGNOSIS — I5022 Chronic systolic (congestive) heart failure: Secondary | ICD-10-CM | POA: Diagnosis not present

## 2017-04-21 DIAGNOSIS — M6281 Muscle weakness (generalized): Secondary | ICD-10-CM

## 2017-04-21 DIAGNOSIS — W1830XA Fall on same level, unspecified, initial encounter: Secondary | ICD-10-CM | POA: Diagnosis not present

## 2017-04-21 DIAGNOSIS — I2510 Atherosclerotic heart disease of native coronary artery without angina pectoris: Secondary | ICD-10-CM | POA: Insufficient documentation

## 2017-04-21 DIAGNOSIS — Y929 Unspecified place or not applicable: Secondary | ICD-10-CM | POA: Insufficient documentation

## 2017-04-21 DIAGNOSIS — Z96652 Presence of left artificial knee joint: Secondary | ICD-10-CM | POA: Diagnosis not present

## 2017-04-21 DIAGNOSIS — W19XXXA Unspecified fall, initial encounter: Secondary | ICD-10-CM

## 2017-04-21 DIAGNOSIS — S51811A Laceration without foreign body of right forearm, initial encounter: Secondary | ICD-10-CM | POA: Diagnosis not present

## 2017-04-21 DIAGNOSIS — G309 Alzheimer's disease, unspecified: Secondary | ICD-10-CM | POA: Diagnosis not present

## 2017-04-21 DIAGNOSIS — Z950 Presence of cardiac pacemaker: Secondary | ICD-10-CM | POA: Diagnosis not present

## 2017-04-21 DIAGNOSIS — E039 Hypothyroidism, unspecified: Secondary | ICD-10-CM | POA: Insufficient documentation

## 2017-04-21 DIAGNOSIS — R2689 Other abnormalities of gait and mobility: Secondary | ICD-10-CM | POA: Diagnosis not present

## 2017-04-21 DIAGNOSIS — Z87891 Personal history of nicotine dependence: Secondary | ICD-10-CM | POA: Diagnosis not present

## 2017-04-21 NOTE — ED Triage Notes (Signed)
Pt here after a fall today hitting the back of his head , no loc . Pt has had issues with balance  That is chronic

## 2017-04-21 NOTE — Therapy (Signed)
[facility] [address] [place], [place], [address] Phone: [phone]   Fax:  [phone]  Physical Therapy Treatment  Patient Details  Name: Corey [name] MRN: [id number] Date of Birth: 1935-05-19 Referring Provider: Scarlette Calico, MD   Encounter Date: 04/21/2017  PT End of Session - 04/21/17 1604    Visit Number  6    Number of Visits  17    Date for PT Re-Evaluation  05/22/17    Authorization Type  Medicare    PT Start Time  1532 ended session early due to medical issues    PT Stop Time  1550    PT Time Calculation (min)  18 min    Equipment Utilized During Treatment  Gait belt    Activity Tolerance  Treatment limited secondary to medical complications (Comment)    Behavior During Therapy  [facility] for tasks assessed/performed       Past Medical History:  Diagnosis Date  . Alcohol abuse, episodic 05/19/2008  . ALLERGIC RHINITIS 04/15/2009  . Allergy    YEAR ROUND,TAKE SHOTS  . Alzheimer's dementia    beginning with sx  . Anxiety   . Arthritis   . ASTHMA 11/16/2006   sinusitis-    hx ?yeast patch/white patch on vocal cord as per [facility] 02/25/11-   . Atrial fibrillation ([place]) 11/16/2006   remote CHF related to atrial fib with rapid ventricular response over 25 yrs per office note,  . Cancer [facility]) 2002   prostate cancer  . Chronic systolic CHF (congestive heart failure) (HCC)    echo (10/02/12): EF 51-02%, grade 1 diastolic dysfunction, trivial AI, mild MR, mild RVE  . COLONIC POLYPS, ADENOMATOUS, HX OF 02/14/2008  . CORONARY ARTERY DISEASE 11/16/2006   LHC (07/2011): LAD with luminal irregularities, proximal circumflex 50%, EF 25%  . Depression   . DIVERTICULOSIS, COLON 02/14/2008  . ESOPHAGEAL STRICTURE 01/28/2008  . GERD 02/14/2008  . Headache(784.0)   . HYPERGLYCEMIA 11/16/2006  . HYPERLIPIDEMIA 02/14/2008  . HYPERTENSION 11/16/2006  . ICD (implantable cardiac defibrillator) in place   . INSOMNIA-SLEEP  DISORDER-UNSPEC 02/11/2009  . Lesion of vocal cord    bx begine  . Myocardial infarction ([place]) 1993  . NICM (nonischemic cardiomyopathy) ([place])   . OBSTRUCTIVE SLEEP APNEA 11/10/2008  . 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 Pacemaker    St. Jude  . Pneumonia    hx of  . PROSTATE CANCER, HX OF 02/25/2000  . SLEEP APNEA 10/03/2009   LOV Dr [name] 12/12 in EPIC    Moderate per patient- settings ?6/last sleep study years ago  . Sleep apnea   . Stroke ([place])    Tia  . Symptomatic bradycardia, secondary to sinus node dysfunction 07/08/2011   s/p [facility] Scientific PPM by Dr Sallyanne Kuster, upgraded to [place] ICD 12/13 by Dr Rayann Heman (SJM)  . TRANSIENT ISCHEMIC ATTACKS, HX OF 11/16/2006  . Type 2 diabetes mellitus with complication, without long-term current use of insulin ([place]) 11/19/2016    Past Surgical History:  Procedure Laterality Date  . BACK SURGERY    . BI-VENTRICULAR IMPLANTABLE CARDIOVERTER DEFIBRILLATOR UPGRADE N/A 01/16/2012   Procedure: BI-VENTRICULAR IMPLANTABLE CARDIOVERTER DEFIBRILLATOR UPGRADE;  Surgeon: Thompson Grayer, MD;  Location: [facility] CATH LAB;  Service: Cardiovascular;  Laterality: N/A;  . CARDIAC CATHETERIZATION     several  . CARDIAC DEFIBRILLATOR PLACEMENT  01/16/12   Upgrade to a biventricular SJM ICD by DR Allred  . COLONOSCOPY    . ESOPHAGOGASTRODUODENOSCOPY     with  dilitation  . INSERT / REPLACE / REMOVE PACEMAKER    . KNEE ARTHROSCOPY     2 surgeries right and 1 left  . LEFT AND RIGHT HEART CATHETERIZATION WITH CORONARY ANGIOGRAM N/A 08/18/2011   Procedure: LEFT AND RIGHT HEART CATHETERIZATION WITH CORONARY ANGIOGRAM;  Surgeon: [name], MD;  Location: [facility] CATH LAB;  Service: Cardiovascular;  Laterality: N/A;  . LUMBAR LAMINECTOMY/DECOMPRESSION MICRODISCECTOMY  03/02/2011   Procedure: LUMBAR LAMINECTOMY/DECOMPRESSION MICRODISCECTOMY;  Surgeon: [name], MD;  Location: [facility] ORS;  Service: Orthopedics;  Laterality: N/A;  Decompression Lumbar 4 - Lumbar(X-Ray)  .  PACEMAKER INSERTION  06/2011   [employer] PPM implanted by Dr Sallyanne Kuster  . PERMANENT PACEMAKER INSERTION Left 07/08/2011   Procedure: PERMANENT PACEMAKER INSERTION;  Surgeon: [name], MD;  Location: [facility] CATH LAB;  Service: Cardiovascular;  Laterality: Left;  . PROSTATE SURGERY  2/02   prostate cancer  . TONSILECTOMY, ADENOIDECTOMY, BILATERAL MYRINGOTOMY AND TUBES    . TONSILLECTOMY  as child  . TOTAL KNEE ARTHROPLASTY Left 06/04/2012   Procedure: TOTAL KNEE ARTHROPLASTY;  Surgeon: [name], MD;  Location: [facility] ORS;  Service: Orthopedics;  Laterality: Left;  . UVULOPALATOPHARYNGOPLASTY  2013  . vocal cord lesion bx      There were no vitals filed for this visit.  Subjective Assessment - 04/21/17 1538    Subjective  Pt reports falling today was stepping down concrete step and missed step, fell, hit head on concrete, hit R shoulder and R hip/buttock.  Pt feels he did not need to go to urgent care. Wife bandaged arm prior to coming to therapy.     Patient is accompained by:  Family member    Pertinent History  Pt goes by "[employer] hold activities;Walking    Patient Stated Goals  "I want to be able to walk a straight line and not fall down.  I want to stand up straight."     Currently in Pain?  Yes    Pain Score  5     Pain Location  Hip    Pain Orientation  Right    Pain Descriptors / Indicators  Aching    Pain Type  Acute pain    Pain Onset  Today    Pain Frequency  Constant    Aggravating Factors   falling    Pain Relieving Factors  nothing    Multiple Pain Sites  Yes    Pain Score  5    Pain Location  Head    Pain Orientation  Right    Pain Descriptors / Indicators  Aching    Pain Type  Acute pain    Pain Onset  Today    Pain Frequency  Constant    Aggravating Factors   falling    Pain Relieving Factors  nothing                No data recorded       [facility] Adult PT Treatment/Exercise - 04/21/17 1547      Neuro Re-ed     Neuro Re-ed Details   Briefly went over corner balance to assess any changes from how it feels at home due to recent fall hitting head; feet apart compliant surface with EO x 20 secs>feet apart EO with head turns up/down and side/side x 10 reps each, feet together EO x 20 secs feet together EO head motions side/side and up/down x 10 reps.  Feet together EC x  2 sets of 20 secs.  Pt tends to list to the R when losing balance.   Ambulated to // bars and upon stepping onto rocker board reports feeling a little more dizzy or "off" therefore checked BP.  This was [facility] at 120/73, however note that HR was 45.  Checked this on pulse oximeter and noted irregularly irregular HR anywhere from 50's to 80's and back.  Pt does has history of afib, but felt combined with fall and symptoms of dizziness, he should leave session and go to urgent care.  Pt willing to drive himself and stated he would go once he left PT office.               PT Education - 04/21/17 1603    Education provided  Yes    Education Details  symptoms of concussion, importance of getting checked out by MD    Person(s) Educated  Patient    Methods  Explanation    Comprehension  Verbalized understanding       PT Short Term Goals - 03/30/17 2011      PT SHORT TERM GOAL #1   Title  Pt will initiate HEP in order to indicate improved functional mobility and decreased fall risk.  (Target Date: 04/22/17)    Time  4    Period  Weeks    Status  New      PT SHORT TERM GOAL #2   Title  Will assess gait speed and set appropriate LTG.     Baseline  3.36 ft/sec without AD on 03/30/17    Time  4    Period  Weeks    Status  Achieved      PT SHORT TERM GOAL #3   Title  Will assess BERG and improve score 4 points from baseline in order to indicate decreased fall risk.      Baseline  45/56 on 03/30/17    Time  4    Period  Weeks    Status  New      PT SHORT TERM GOAL #4   Title  Will assess varying AD as appropriate to decrease fall risk.       Baseline  assessed SPC on 03/30/17, will continue to address    Time  4    Period  Weeks    Status  New      PT SHORT TERM GOAL #5   Title  Pt will perform 5TSS <20 secs without UE support in order to indicate improved functional strength.      Time  4    Period  Weeks    Status  New        PT Long Term Goals - 03/23/17 2021      PT LONG TERM GOAL #1   Title  Pt will be independent with HEP (with assist from wife as needed) in order to indicate improved functional mobility and decreased fall risk.  (Target Date: 05/22/17)    Time  8    Period  Weeks    Status  New    Target Date  05/22/17      PT LONG TERM GOAL #2   Title  Pt will improve BERG balance score by 8 points from baseline in order to indicate decreased fall risk.      Time  8    Period  Weeks    Status  New      PT LONG TERM GOAL #3   Title  Pt will perform 5TSS <17 secs without UE support in order to indicate improved functional strength.      Time  8    Period  Weeks    Status  New      PT LONG TERM GOAL #4   Title  Pt will ambulate up to 1000' over unlevel paved outdoor surfaces at mod I level with LRAD in order to indicate safe community negotiation.      Time  8    Period  Weeks    Status  New      PT LONG TERM GOAL #5   Title  Pt will verbalize initiation of community fitness program in order to maintain gains made in therapy.      Time  8    Period  Weeks    Status  New            Plan - 04/21/17 1607    Clinical Impression Statement  Pt reports having fall prior to coming to therapy session. He fell to right side, hitting R arm, R hip/buttock and R head against brick wall.  Pt initially stating he felt okay.  Balance seemed close to baseline, however following balance check he reported increased dizziness.  BP was WFL, however HR very irregularly irregular, therefore recommend he go to urgent care at this time before continuing therapy. Pt vebralized understanding.     Rehab Potential  Fair     Clinical Impairments Affecting Rehab Potential  ETOH abuse and underlying depression     PT Frequency  2x / week    PT Duration  8 weeks    PT Treatment/Interventions  ADLs/Self Care Home Management;DME Instruction;Gait training;Stair training;Functional mobility training;Therapeutic activities;Therapeutic exercise;Balance training;Neuromuscular re-education;Patient/family education;Orthotic Fit/Training;Vestibular;Passive range of motion    PT Next Visit Plan  Did he go to urgent care? Check compliance with HEP, STG near due regarding HEP;  balance on airex pad, narrow BOS with head turns, tandem stance balance.     Consulted and Agree with Plan of Care  Patient       Patient will benefit from skilled therapeutic intervention in order to improve the following deficits and impairments:  Abnormal gait, Decreased activity tolerance, Decreased balance, Decreased cognition, Decreased endurance, Decreased knowledge of use of DME, Decreased mobility, Decreased strength, Difficulty walking, Impaired perceived functional ability, Impaired flexibility, Postural dysfunction  Visit Diagnosis: Unsteadiness on feet  Muscle weakness (generalized)  Other abnormalities of gait and mobility  Repeated falls     Problem List Patient Active Problem List   Diagnosis Date Noted  . Pneumonia of right lower lobe due to infectious organism ([place]) 03/25/2017  . Type 2 diabetes mellitus with complication, without long-term current use of insulin ([place]) 11/19/2016  . Chronic right-sided thoracic back pain 08/15/2016  . DDD (degenerative disc disease), thoracic 06/08/2016  . Depression with anxiety 05/31/2016  . 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 Bronchiectasis without acute exacerbation ([place]) 12/31/2015  . Chronic alcoholic hepatitis 22/29/7989  . Obstructive chronic bronchitis without exacerbation ([place]) 11/12/2015  . Spinal stenosis in cervical region 10/02/2015  . Gallbladder sludge 03/02/2015   . Other iron deficiency anemias 11/13/2013  . B12 deficiency anemia 11/13/2013  . Encounter for therapeutic drug monitoring 02/25/2013  . Long term (current) use of anticoagulants 05/12/2012  . [employer] 11/01/2011  . Chronic systolic heart failure ([place]) 08/28/2011  . Vocal cord nodule 05/19/2011  . Lumbar spinal stenosis 03/03/2011  . Seasonal and perennial allergic rhinitis 04/15/2009  .  Insomnia 02/11/2009  . Obstructive sleep apnea 11/10/2008  . Hyperlipidemia with target LDL less than 100 02/14/2008  . GERD 02/14/2008  . ESOPHAGEAL STRICTURE 01/28/2008  . Essential hypertension 11/16/2006  . Coronary atherosclerosis 11/16/2006  . Atrial fibrillation ([place]) 11/16/2006    Cameron Sprang, PT, MPT [facility] [address] [place], [place], [address] Phone: [phone]   Fax:  [phone] 04/21/17, 4:11 PM  Name: MARKICE TORBERT MRN: [id number] Date of Birth: Jun 14, 1935

## 2017-04-21 NOTE — ED Provider Notes (Signed)
[facility] EMERGENCY DEPARTMENT Provider Note   CSN: [id number] Arrival date & time: 04/21/17  1612     History   Chief Complaint Chief Complaint  Patient presents with  . Fall    HPI NESHAWN AIRD is a 82 y.o. male.  82 yo M with a chief complaint of a fall from standing.  The patient has a chronic issue with instability on his feet.  He does not think that this is changed.  He lost his balance and then struck the right posterior side of the head.  He denies loss of consciousness.  Denies confusion denies vomiting.  Denies neck pain chest pain abdominal pain or back pain.  Denies extremity pain.  Continues to be ambulatory without difficulty.  He discussed this with his family physician who felt that he needed a CT of his head due to his age.  The history is provided by the patient.  Fall  This is a new problem. The current episode started yesterday. The problem occurs constantly. The problem has not changed since onset.Pertinent negatives include no chest pain, no abdominal pain, no headaches and no shortness of breath. Nothing aggravates the symptoms. Nothing relieves the symptoms. He has tried nothing for the symptoms. The treatment provided no relief.    Past Medical History:  Diagnosis Date  . Alcohol abuse, episodic 05/19/2008  . ALLERGIC RHINITIS 04/15/2009  . Allergy    YEAR ROUND,TAKE SHOTS  . Alzheimer's dementia    beginning with sx  . Anxiety   . Arthritis   . ASTHMA 11/16/2006   sinusitis-    hx ?yeast patch/white patch on vocal cord as per [facility] 02/25/11-   . Atrial fibrillation ([place]) 11/16/2006   remote CHF related to atrial fib with rapid ventricular response over 25 yrs per office note,  . Cancer [facility]) 2002   prostate cancer  . Chronic systolic CHF (congestive heart failure) (HCC)    echo (10/02/12): EF 36-14%, grade 1 diastolic dysfunction, trivial AI, mild MR, mild RVE  . COLONIC POLYPS, ADENOMATOUS, HX OF 02/14/2008  . CORONARY ARTERY  DISEASE 11/16/2006   LHC (07/2011): LAD with luminal irregularities, proximal circumflex 50%, EF 25%  . Depression   . DIVERTICULOSIS, COLON 02/14/2008  . ESOPHAGEAL STRICTURE 01/28/2008  . GERD 02/14/2008  . Headache(784.0)   . HYPERGLYCEMIA 11/16/2006  . HYPERLIPIDEMIA 02/14/2008  . HYPERTENSION 11/16/2006  . ICD (implantable cardiac defibrillator) in place   . INSOMNIA-SLEEP DISORDER-UNSPEC 02/11/2009  . Lesion of vocal cord    bx begine  . Myocardial infarction ([place]) 1993  . NICM (nonischemic cardiomyopathy) ([place])   . OBSTRUCTIVE SLEEP APNEA 11/10/2008  . 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 Pacemaker    St. Jude  . Pneumonia    hx of  . PROSTATE CANCER, HX OF 02/25/2000  . SLEEP APNEA 10/03/2009   LOV Dr [name] 12/12 in EPIC    Moderate per patient- settings ?6/last sleep study years ago  . Sleep apnea   . Stroke ([place])    Tia  . Symptomatic bradycardia, secondary to sinus node dysfunction 07/08/2011   s/p [facility] [employer] PPM by Dr Sallyanne Kuster, upgraded to [place] ICD 12/13 by Dr Rayann Heman (SJM)  . TRANSIENT ISCHEMIC ATTACKS, HX OF 11/16/2006  . Type 2 diabetes mellitus with complication, without long-term current use of insulin ([place]) 11/19/2016    Patient Active Problem List   Diagnosis Date Noted  . Pneumonia of right lower lobe due to infectious organism ([place]) 03/25/2017  .  Type 2 diabetes mellitus with complication, without long-term current use of insulin ([place]) 11/19/2016  . Chronic right-sided thoracic back pain 08/15/2016  . DDD (degenerative disc disease), thoracic 06/08/2016  . Depression with anxiety 05/31/2016  . 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 Bronchiectasis without acute exacerbation ([place]) 12/31/2015  . Chronic alcoholic hepatitis 81/01/7508  . Obstructive chronic bronchitis without exacerbation ([place]) 11/12/2015  . Spinal stenosis in cervical region 10/02/2015  . Gallbladder sludge 03/02/2015  . Other iron deficiency anemias 11/13/2013  . B12  deficiency anemia 11/13/2013  . Encounter for therapeutic drug monitoring 02/25/2013  . Long term (current) use of anticoagulants 05/12/2012  . [employer] 11/01/2011  . Chronic systolic heart failure ([place]) 08/28/2011  . Vocal cord nodule 05/19/2011  . Lumbar spinal stenosis 03/03/2011  . Seasonal and perennial allergic rhinitis 04/15/2009  . Insomnia 02/11/2009  . Obstructive sleep apnea 11/10/2008  . Hyperlipidemia with target LDL less than 100 02/14/2008  . GERD 02/14/2008  . ESOPHAGEAL STRICTURE 01/28/2008  . Essential hypertension 11/16/2006  . Coronary atherosclerosis 11/16/2006  . Atrial fibrillation ([place]) 11/16/2006    Past Surgical History:  Procedure Laterality Date  . BACK SURGERY    . BI-VENTRICULAR IMPLANTABLE CARDIOVERTER DEFIBRILLATOR UPGRADE N/A 01/16/2012   Procedure: BI-VENTRICULAR IMPLANTABLE CARDIOVERTER DEFIBRILLATOR UPGRADE;  Surgeon: Thompson Grayer, MD;  Location[facility] C[facility] LAB;  Service: Cardiovascular;  Laterality: N/A;  . CARDIAC CATHETERIZATION     several  . CARDIAC DEFIBRILLATOR PLACEMENT  01/16/12   Upgrade to a biventricular SJM ICD by DR Allred  . COLONOSCOPY    . ESOPHAGOGASTRODUODENOSCOPY     with dilitation  . INSERT / REPLACE / REMOVE PACEMAKER    . KNEE ARTHROSCOPY     2 surgeries right and 1 left  . LEFT AND RIGHT HEART CATHETERIZATION WITH CORONARY ANGIOGRAM N/A 08/18/2011   Procedure: LEFT AND RIGHT HEART CATHETERIZATION WITH CORONARY ANGIOGRAM;  Surgeon: [name], MD;  Location: [facility] CATH LAB;  Service: Cardiovascular;  Laterality: N/A;  . LUMBAR LAMINECTOMY/DECOMPRESSION MICRODISCECTOMY  03/02/2011   Procedure: LUMBAR LAMINECTOMY/DECOMPRESSION MICRODISCECTOMY;  Surgeon: [name], MD;  Location: [facility] ORS;  Service: Orthopedics;  Laterality: N/A;  Decompression Lumbar 4 - Lumbar(X-Ray)  . PACEMAKER INSERTION  06/2011   [employer] PPM implanted by Dr Sallyanne Kuster  . PERMANENT PACEMAKER INSERTION Left 07/08/2011    Procedure: PERMANENT PACEMAKER INSERTION;  Surgeon: [name], MD;  Location: [facility] CATH LAB;  Service: Cardiovascular;  Laterality: Left;  . PROSTATE SURGERY  2/02   prostate cancer  . TONSILECTOMY, ADENOIDECTOMY, BILATERAL MYRINGOTOMY AND TUBES    . TONSILLECTOMY  as child  . TOTAL KNEE ARTHROPLASTY Left 06/04/2012   Procedure: TOTAL KNEE ARTHROPLASTY;  Surgeon: [name], MD;  Location: [facility] ORS;  Service: Orthopedics;  Laterality: Left;  . UVULOPALATOPHARYNGOPLASTY  2013  . vocal cord lesion bx          Home Medications    Prior to Admission medications   Medication Sig Start Date End Date Taking? Authorizing Provider  albuterol (ACCUNEB) 0.63 MG/3ML nebulizer solution Inhale into the lungs.    [provider]  albuterol (PROVENTIL HFA;VENTOLIN HFA) 108 (90 BASE) MCG/ACT inhaler Inhale 1-2 puffs into the lungs every 6 (six) hours as needed for shortness of breath. Patient not taking: Reported on 03/25/2017 02/04/14   Baird Lyons D, MD  amiodarone (PACERONE) 200 MG tablet TAKE ONE-HALF TABLET DAILY 02/13/17   Allred, Jeneen Rinks, MD  Empagliflozin-metFORMIN HCl (SYNJARDY) 05-998 MG TABS Take 1 tablet by mouth  2 (two) times daily. 03/22/17   Janith Lima, MD  folic acid (FOLVITE) 1 MG tablet TAKE ONE TABLET EVERY DAY 12/21/16   Janith Lima, MD  levalbuterol Penne Lash) 1.25 MG/3ML nebulizer solution Inhale into the lungs.    [provider]  levothyroxine (SYNTHROID, LEVOTHROID) 125 MCG tablet TAKE ONE TABLET EACH DAY 04/01/17   Janith Lima, MD  metoprolol succinate (TOPROL-XL) 50 MG 24 hr tablet TAKE ONE-HALF TABLET EVERY EVENING 05/09/16   Allred, Jeneen Rinks, MD  mometasone (NASONEX) 50 MCG/ACT nasal spray 1-2 puffs each nostril once or twice daily if needed 01/01/16   Deneise Lever, MD  NONFORMULARY OR COMPOUNDED ITEM Allergy Vaccine 1:10 Given at [facility] Pulmonary    [provider]  pantoprazole (PROTONIX) 40 MG tablet TAKE ONE TABLET BY MOUTH ONCE  DAILY 03/15/17   Armbruster, Carlota Raspberry, MD  rosuvastatin (CRESTOR) 5 MG tablet Take 1 tablet (5 mg total) by mouth daily at 6 PM. 10/28/16   Janith Lima, MD  sertraline (ZOLOFT) 100 MG tablet Take 1 tablet (100 mg total) by mouth daily. 05/31/16   Janith Lima, MD  spironolactone (ALDACTONE) 25 MG tablet TAKE ONE TABLET BY MOUTH ONCE DAILY 04/10/17   Burtis Junes, NP  thiamine (VITAMIN B-1) 100 MG tablet Take 1 tablet (100 mg total) by mouth daily. 03/26/17   Janith Lima, MD  umeclidinium-vilanterol (ANORO ELLIPTA) 62.5-25 MCG/INH AEPB Inhale 1 puff into the lungs daily. 11/12/15   Janith Lima, MD  warfarin (COUMADIN) 2.5 MG tablet TAKE AS DIRECTED BY COUMADIN CLINIC 02/22/17   Thompson Grayer, MD    Family History Family History  Problem Relation Age of Onset  . Heart attack Father   . Heart attack Brother   . Heart disease Maternal Uncle   . Heart attack Maternal Uncle   . Colon cancer Neg Hx   . Esophageal cancer Neg Hx   . Rectal cancer Neg Hx   . Prostate cancer Neg Hx   . Stomach cancer Neg Hx     Social History Social History   Tobacco Use  . Smoking status: Former Smoker    Packs/day: 1.00    Years: 4.00    Pack years: 4.00    Types: Cigarettes    Last attempt to quit: 03/22/1964    Years since quitting: 53.1  . Smokeless tobacco: Never Used  . Tobacco comment: reports smoked socially  Substance Use Topics  . Alcohol use: Yes    Alcohol/week: 21.0 oz    Types: 35 Shots of liquor per week    Comment: 2 mixed drinks daily, some days  . Drug use: No     Allergies   Altace [ramipril]; Hydrocodone; and Oxycodone   Review of Systems Review of Systems  Constitutional: Negative for chills and fever.  HENT: Negative for congestion and facial swelling.   Eyes: Negative for discharge and visual disturbance.  Respiratory: Negative for shortness of breath.   Cardiovascular: Negative for chest pain and palpitations.  Gastrointestinal: Negative for abdominal  pain, diarrhea and vomiting.  Musculoskeletal: Negative for arthralgias and myalgias.  Skin: Negative for color change and rash.  Neurological: Negative for tremors, syncope and headaches.  Psychiatric/Behavioral: Negative for confusion and dysphoric mood.     Physical Exam Updated Vital Signs BP 131/69 (BP Location: Right Arm)   Pulse 64   Temp 97.9 F (36.6 C) (Oral)   Resp 18   SpO2 98%   Physical Exam  Constitutional:  He is oriented to person, place, and time. He appears well-developed and well-nourished.  HENT:  Head: Normocephalic and atraumatic.  No signs of trauma  Eyes: Pupils are equal, round, and reactive to light. EOM are normal.  Neck: Normal range of motion. Neck supple. No JVD present.  Cardiovascular: Normal rate and regular rhythm. Exam reveals no gallop and no friction rub.  No murmur heard. Pulmonary/Chest: No respiratory distress. He has no wheezes.  Abdominal: He exhibits no distension. There is no rebound and no guarding.  Musculoskeletal: Normal range of motion.  Neurological: He is alert and oriented to person, place, and time.  Skin: No rash noted. No pallor.  Psychiatric: He has a normal mood and affect. His behavior is normal.  Nursing note and vitals reviewed.    ED Treatments / Results  Labs (all labs ordered are listed, but only abnormal results are displayed) Labs Reviewed - No data to display  EKG None  Radiology Ct Head Wo Contrast  Result Date: 04/21/2017 CLINICAL DATA:  Fall at physical therapy today. Hit left side of head. Initial encounter. EXAM: CT HEAD WITHOUT CONTRAST TECHNIQUE: Contiguous axial images were obtained from the base of the skull through the vertex without intravenous contrast. COMPARISON:  06/03/2016 FINDINGS: Brain: There is no evidence of acute infarct, intracranial hemorrhage, mass, midline shift, or extra-axial fluid collection. 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  Bilateral periventricular white matter hypodensities are similar to the prior study and nonspecific but compatible with mild chronic small vessel ischemic disease. Mild cerebral atrophy is unchanged. Vascular: Calcified atherosclerosis at the skull base. No hyperdense vessel. Skull: No fracture or suspicious osseous lesion. Sinuses/Orbits: New, near complete opacification of the right maxillary sinus. Mild left greater than right ethmoid air cell opacification. Clear mastoid air cells. Unremarkable orbits. Other: None. IMPRESSION: 1. No evidence of acute intracranial abnormality. 2. Chronic ischemia as above. 3. Right maxillary sinusitis. Electronically Signed   By: Logan Bores M.D.   On: 04/21/2017 17:59    Procedures Procedures (including critical care time)  Medications Ordered in ED Medications - No data to display   Initial Impression / Assessment and Plan / ED Course  I have reviewed the triage vital signs and the nursing notes.  Pertinent labs & imaging results that were available during my care of the patient were reviewed by me and considered in my medical decision making (see chart for details).     82 yo M with a chief complaint of a closed head injury.  He had called his family physician who told him he needed to get a CT scan.  This is negative.  Discussed results with him.  Discharge home.  11:19 PM:  I have discussed the diagnosis/risks/treatment options with the patient and believe the pt to be eligible for discharge home to follow-up with PCP. We also discussed returning to the ED immediately if new or worsening sx occur. We discussed the sx which are most concerning (e.g., sudden worsening pain, fever, inability to tolerate by mouth) that necessitate immediate return. Medications administered to the patient during their visit and any new prescriptions provided to the patient are listed below.  Medications given during this visit Medications - No data to display   The  patient appears reasonably screen and/or stabilized for discharge and I doubt any other medical condition or other [facility] requiring further screening, evaluation, or treatment in the ED at this time prior to discharge.  Final Clinical Impressions(s) / ED Diagnoses   Final diagnoses:  Fall, initial encounter  Injury of head, initial encounter  Skin tear of right forearm without complication, initial encounter    ED Discharge Orders    None       Deno Etienne, DO 04/21/17 2319

## 2017-04-21 NOTE — ED Notes (Signed)
Patient verbalizes understanding of discharge instructions. Opportunity for questioning and answers were provided. Armband removed by staff, pt discharged from ED ambulatory.   

## 2017-04-24 ENCOUNTER — Ambulatory Visit: Payer: Medicare Other | Attending: Internal Medicine | Admitting: Physical Therapy

## 2017-04-24 ENCOUNTER — Encounter: Payer: Self-pay | Admitting: Physical Therapy

## 2017-04-24 ENCOUNTER — Ambulatory Visit: Payer: Medicare Other | Admitting: Internal Medicine

## 2017-04-24 DIAGNOSIS — M6281 Muscle weakness (generalized): Secondary | ICD-10-CM | POA: Insufficient documentation

## 2017-04-24 DIAGNOSIS — R2689 Other abnormalities of gait and mobility: Secondary | ICD-10-CM | POA: Diagnosis not present

## 2017-04-24 DIAGNOSIS — R296 Repeated falls: Secondary | ICD-10-CM

## 2017-04-24 DIAGNOSIS — R2681 Unsteadiness on feet: Secondary | ICD-10-CM | POA: Diagnosis not present

## 2017-04-24 DIAGNOSIS — R293 Abnormal posture: Secondary | ICD-10-CM | POA: Insufficient documentation

## 2017-04-24 NOTE — Patient Instructions (Addendum)
Perform these at your counter or a dining table to hold onto for stability:   "I love a Parade" Lift   At counter for balance as needed: high knee marching forward and then backward. 3 second pauses with each knee lift.  Repeat 3 laps each way. Do _1-2_ sessions per day. http://gt2.exer.us/345   Copyright  VHI. All rights reserved. .  Feet Heel-Toe "Tandem"   At counter: Arms at sides, walk a straight line forward bringing one foot directly in front of the other, and then a straight line backwards bringing one foot directly behind the other one.  Repeat for _3 laps each way. Do _1-2_ sessions per day.  Copyright  VHI. All rights reserved.    Perform these in a corner with a chair in front of you for safety:  Feet Together (Compliant Surface) Varied Arm Positions - Eyes Closed    Stand on compliant surface: pillow/s with feet together and arms at sides. Close eyes and visualize upright position. Hold _30_ seconds. Repeat _3_ times per session. Do _1-2_ sessions per day.  Copyright  VHI. All rights reserved.    Feet Apart (Compliant Surface) Head Motion - Eyes Closed    Stand on compliant surface: _pillow/s_ with feet shoulder width apart. Close eyes and move head slowly: 1. Up and down x 10 reps 2. Left and right  10 reps 3. Diagonal by looking up to the right/down to the left x 10 reps 4. Diagonal by looking up to the left/down to the right x 10 reps  Do _1-2_ sessions per day.  Copyright  VHI. All rights reserved.

## 2017-04-25 ENCOUNTER — Ambulatory Visit (INDEPENDENT_AMBULATORY_CARE_PROVIDER_SITE_OTHER): Payer: Medicare Other | Admitting: Internal Medicine

## 2017-04-25 ENCOUNTER — Encounter: Payer: Self-pay | Admitting: Internal Medicine

## 2017-04-25 VITALS — BP 124/60 | HR 85 | Temp 97.5°F | Resp 16 | Ht 72.0 in | Wt 201.1 lb

## 2017-04-25 DIAGNOSIS — S0990XA Unspecified injury of head, initial encounter: Secondary | ICD-10-CM

## 2017-04-25 NOTE — Progress Notes (Signed)
Subjective:  Patient ID: Corey [name], male    DOB: December 25, 1935  Age: 82 y.o. MRN: [id number]  CC: Headache   HPI Corey [name] presents for f/up - He fell 4 days ago and sustained a head injury.  He was seen in the ED and his CT scan was unremarkable.  He complains of mild soreness on the right top of his scalp but denies headache, nausea, vomiting, or changes in his vision or hearing.  He tells me the fall occurred because he tripped his feet on some stairs.  Outpatient Medications Prior to Visit  Medication Sig Dispense Refill  . albuterol (ACCUNEB) 0.63 MG/3ML nebulizer solution Inhale into the lungs.    Marland Kitchen albuterol (PROVENTIL HFA;VENTOLIN HFA) 108 (90 BASE) MCG/ACT inhaler Inhale 1-2 puffs into the lungs every 6 (six) hours as needed for shortness of breath. 1 Inhaler 11  . amiodarone (PACERONE) 200 MG tablet TAKE ONE-HALF TABLET DAILY 45 tablet 3  . Empagliflozin-metFORMIN HCl (SYNJARDY) 05-998 MG TABS Take 1 tablet by mouth 2 (two) times daily. 465 tablet 1  . folic acid (FOLVITE) 1 MG tablet TAKE ONE TABLET EVERY DAY 90 tablet 1  . levalbuterol (XOPENEX) 1.25 MG/3ML nebulizer solution Inhale into the lungs.    Marland Kitchen levothyroxine (SYNTHROID, LEVOTHROID) 125 MCG tablet TAKE ONE TABLET EACH DAY 90 tablet 1  . metoprolol succinate (TOPROL-XL) 50 MG 24 hr tablet TAKE ONE-HALF TABLET EVERY EVENING 15 tablet 11  . mometasone (NASONEX) 50 MCG/ACT nasal spray 1-2 puffs each nostril once or twice daily if needed 17 g 0  . NONFORMULARY OR COMPOUNDED ITEM Allergy Vaccine 1:10 Given at [facility] Pulmonary    . pantoprazole (PROTONIX) 40 MG tablet TAKE ONE TABLET BY MOUTH ONCE DAILY 90 tablet 0  . rosuvastatin (CRESTOR) 5 MG tablet Take 1 tablet (5 mg total) by mouth daily at 6 PM. 90 tablet 1  . sertraline (ZOLOFT) 100 MG tablet Take 1 tablet (100 mg total) by mouth daily. 90 tablet 3  . spironolactone (ALDACTONE) 25 MG tablet TAKE ONE TABLET BY MOUTH ONCE DAILY 30 tablet 8  . thiamine  (VITAMIN B-1) 100 MG tablet Take 1 tablet (100 mg total) by mouth daily. 90 tablet 1  . umeclidinium-vilanterol (ANORO ELLIPTA) 62.5-25 MCG/INH AEPB Inhale 1 puff into the lungs daily. 90 each 3  . warfarin (COUMADIN) 2.5 MG tablet TAKE AS DIRECTED BY COUMADIN CLINIC 35 tablet 1   No facility-administered medications prior to visit.     ROS Review of Systems  Constitutional: Negative for chills, diaphoresis, fatigue and fever.  HENT: Negative.  Negative for sinus pressure and trouble swallowing.   Eyes: Negative.   Respiratory: Negative for cough, chest tightness, shortness of breath and wheezing.   Cardiovascular: Negative for chest pain, palpitations and leg swelling.  Gastrointestinal: Negative for abdominal pain, diarrhea, nausea and vomiting.  Endocrine: Negative.   Genitourinary: Negative.  Negative for difficulty urinating.  Musculoskeletal: Negative.  Negative for arthralgias, myalgias and neck pain.  Skin: Negative.  Negative for color change and rash.  Neurological: Negative for dizziness, weakness, light-headedness, numbness and headaches.  Hematological: Negative for adenopathy. Does not bruise/bleed easily.  Psychiatric/Behavioral: Negative.  Negative for confusion. The patient is not nervous/anxious.     Objective:  BP 124/60 (BP Location: Left Arm, Patient Position: Sitting, Cuff Size: Normal)   Pulse 85   Temp (!) 97.5 F (36.4 C) (Oral)   Resp 16   Ht 6' (1.829 m)   Wt 201 lb 1.3  oz (91.2 kg)   SpO2 96%   BMI 27.27 kg/m   BP Readings from Last 3 Encounters:  04/25/17 124/60  04/21/17 131/69  03/25/17 124/72    Wt Readings from Last 3 Encounters:  04/25/17 201 lb 1.3 oz (91.2 kg)  03/25/17 199 lb (90.3 kg)  03/22/17 202 lb (91.6 kg)    Physical Exam  Constitutional: He is oriented to person, place, and time.  Non-toxic appearance. He does not have a sickly appearance. He does not appear ill. No distress.  HENT:  Head: Normocephalic and atraumatic.  Head is without abrasion and without contusion.  Mouth/Throat: Oropharynx is clear and moist. No oropharyngeal exudate.  Eyes: Conjunctivae are normal. Left eye exhibits no discharge. No scleral icterus.  Neck: Normal range of motion. Neck supple. No JVD present. No thyromegaly present.  Cardiovascular: Normal rate, regular rhythm and normal heart sounds. Exam reveals no gallop and no friction rub.  No murmur heard. Pulmonary/Chest: Effort normal and breath sounds normal. No respiratory distress. He has no wheezes. He has no rales.  Abdominal: Soft. Bowel sounds are normal. He exhibits no distension and no mass. There is no tenderness. There is no guarding.  Musculoskeletal: Normal range of motion. He exhibits no edema, tenderness or deformity.  Lymphadenopathy:    He has no cervical adenopathy.  Neurological: He is alert and oriented to person, place, and time.  Skin: Skin is warm and dry. No rash noted. He is not diaphoretic. No erythema. No pallor.  Vitals reviewed.   Lab Results  Component Value Date   WBC 6.3 03/22/2017   HGB 13.9 03/22/2017   HCT 40.9 03/22/2017   PLT 166.0 03/22/2017   GLUCOSE 144 (H) 03/22/2017   CHOL 168 11/16/2016   TRIG 147.0 11/16/2016   HDL 40.20 11/16/2016   LDLCALC 99 11/16/2016   ALT 96 (H) 03/22/2017   AST 205 (H) 03/22/2017   NA 136 03/22/2017   K 4.4 03/22/2017   CL 99 03/22/2017   CREATININE 1.23 03/22/2017   BUN 15 03/22/2017   CO2 30 03/22/2017   TSH 4.15 03/22/2017   PSA <0.015 05/26/2016   INR 1.8 02/02/2017   HGBA1C 8.2 03/22/2017   MICROALBUR 10.7 (H) 03/22/2017    Ct Head Wo Contrast  Result Date: 04/21/2017 CLINICAL DATA:  Fall at physical therapy today. Hit left side of head. Initial encounter. EXAM: CT HEAD WITHOUT CONTRAST TECHNIQUE: Contiguous axial images were obtained from the base of the skull through the vertex without intravenous contrast. COMPARISON:  06/03/2016 FINDINGS: Brain: There is no evidence of acute infarct,  intracranial hemorrhage, mass, midline shift, or extra-axial fluid collection. 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 Bilateral periventricular white matter hypodensities are similar to the prior study and nonspecific but compatible with mild chronic small vessel ischemic disease. Mild cerebral atrophy is unchanged. Vascular: Calcified atherosclerosis at the skull base. No hyperdense vessel. Skull: No fracture or suspicious osseous lesion. Sinuses/Orbits: New, near complete opacification of the right maxillary sinus. Mild left greater than right ethmoid air cell opacification. Clear mastoid air cells. Unremarkable orbits. Other: None. IMPRESSION: 1. No evidence of acute intracranial abnormality. 2. Chronic ischemia as above. 3. Right maxillary sinusitis. Electronically Signed   By: Logan Bores M.D.   On: 04/21/2017 17:59    Assessment & Plan:   Alta was seen today for headache.  Diagnoses and all orders for this visit:  Injury of head, initial encounter- He had a  head injury with no loss of consciousness and a normal CT.  He has some soreness but no headache or other neurological symptoms.  He will let me know if he develops any new or worsening symptoms.   I am having Corey [name] maintain his albuterol, NONFORMULARY OR COMPOUNDED ITEM, levalbuterol, albuterol, umeclidinium-vilanterol, mometasone, metoprolol succinate, sertraline, rosuvastatin, folic acid, amiodarone, warfarin, pantoprazole, Empagliflozin-metFORMIN HCl, thiamine, levothyroxine, and spironolactone.  No orders of the defined types were placed in this encounter.    Follow-up: Return if symptoms worsen or fail to improve.  Scarlette Calico, MD

## 2017-04-25 NOTE — Therapy (Signed)
[facility] [address] [place], [place], [address] Phone: [phone]   Fax:  [phone]  Physical Therapy Treatment  Patient Details  Name: Corey [name] MRN: [id number] Date of Birth: 03-26-1935 Referring Provider: Scarlette Calico, MD   Encounter Date: 04/24/2017  PT End of Session - 04/25/17 1111    Visit Number  7    Number of Visits  17    Date for PT Re-Evaluation  05/22/17    Authorization Type  Medicare    PT Start Time  1150    PT Stop Time  1230    PT Time Calculation (min)  40 min    Equipment Utilized During Treatment  Gait belt    Activity Tolerance  Patient tolerated treatment well    Behavior During Therapy  [facility] for tasks assessed/performed       Past Medical History:  Diagnosis Date  . Alcohol abuse, episodic 05/19/2008  . ALLERGIC RHINITIS 04/15/2009  . Allergy    YEAR ROUND,TAKE SHOTS  . Alzheimer's dementia    beginning with sx  . Anxiety   . Arthritis   . ASTHMA 11/16/2006   sinusitis-    hx ?yeast patch/white patch on vocal cord as per [facility] 02/25/11-   . Atrial fibrillation ([place]) 11/16/2006   remote CHF related to atrial fib with rapid ventricular response over 25 yrs per office note,  . Cancer [facility]) 2002   prostate cancer  . Chronic systolic CHF (congestive heart failure) (HCC)    echo (10/02/12): EF 94-85%, grade 1 diastolic dysfunction, trivial AI, mild MR, mild RVE  . COLONIC POLYPS, ADENOMATOUS, HX OF 02/14/2008  . CORONARY ARTERY DISEASE 11/16/2006   LHC (07/2011): LAD with luminal irregularities, proximal circumflex 50%, EF 25%  . Depression   . DIVERTICULOSIS, COLON 02/14/2008  . ESOPHAGEAL STRICTURE 01/28/2008  . GERD 02/14/2008  . Headache(784.0)   . HYPERGLYCEMIA 11/16/2006  . HYPERLIPIDEMIA 02/14/2008  . HYPERTENSION 11/16/2006  . ICD (implantable cardiac defibrillator) in place   . INSOMNIA-SLEEP DISORDER-UNSPEC 02/11/2009  . Lesion of vocal cord    bx begine  . Myocardial  infarction ([place]) 1993  . NICM (nonischemic cardiomyopathy) ([place])   . OBSTRUCTIVE SLEEP APNEA 11/10/2008  . 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 Pacemaker    St. Jude  . Pneumonia    hx of  . PROSTATE CANCER, HX OF 02/25/2000  . SLEEP APNEA 10/03/2009   LOV Dr [name] 12/12 in EPIC    Moderate per patient- settings ?6/last sleep study years ago  . Sleep apnea   . Stroke ([place])    Tia  . Symptomatic bradycardia, secondary to sinus node dysfunction 07/08/2011   s/p [facility] Scientific PPM by Dr Sallyanne Kuster, upgraded to [place] ICD 12/13 by Dr Rayann Heman (SJM)  . TRANSIENT ISCHEMIC ATTACKS, HX OF 11/16/2006  . Type 2 diabetes mellitus with complication, without long-term current use of insulin ([place]) 11/19/2016    Past Surgical History:  Procedure Laterality Date  . BACK SURGERY    . BI-VENTRICULAR IMPLANTABLE CARDIOVERTER DEFIBRILLATOR UPGRADE N/A 01/16/2012   Procedure: BI-VENTRICULAR IMPLANTABLE CARDIOVERTER DEFIBRILLATOR UPGRADE;  Surgeon: Thompson Grayer, MD;  Location: [facility] CATH LAB;  Service: Cardiovascular;  Laterality: N/A;  . CARDIAC CATHETERIZATION     several  . CARDIAC DEFIBRILLATOR PLACEMENT  01/16/12   Upgrade to a biventricular SJM ICD by DR Allred  . COLONOSCOPY    . ESOPHAGOGASTRODUODENOSCOPY     with dilitation  . INSERT / REPLACE / REMOVE PACEMAKER    .  KNEE ARTHROSCOPY     2 surgeries right and 1 left  . LEFT AND RIGHT HEART CATHETERIZATION WITH CORONARY ANGIOGRAM N/A 08/18/2011   Procedure: LEFT AND RIGHT HEART CATHETERIZATION WITH CORONARY ANGIOGRAM;  Surgeon: [name], MD;  Location: [place] CATH LAB;  Service: Cardiovascular;  Laterality: N/A;  . LUMBAR LAMINECTOMY/DECOMPRESSION MICRODISCECTOMY  03/02/2011   Procedure: LUMBAR LAMINECTOMY/DECOMPRESSION MICRODISCECTOMY;  Surgeon: [name], MD;  Location: [facility] ORS;  Service: Orthopedics;  Laterality: N/A;  Decompression Lumbar 4 - Lumbar(X-Ray)  . PACEMAKER INSERTION  06/2011   [employer] PPM implanted by Dr Sallyanne Kuster  .  PERMANENT PACEMAKER INSERTION Left 07/08/2011   Procedure: PERMANENT PACEMAKER INSERTION;  Surgeon: [name], MD;  Location: [facility] CATH LAB;  Service: Cardiovascular;  Laterality: Left;  . PROSTATE SURGERY  2/02   prostate cancer  . TONSILECTOMY, ADENOIDECTOMY, BILATERAL MYRINGOTOMY AND TUBES    . TONSILLECTOMY  as child  . TOTAL KNEE ARTHROPLASTY Left 06/04/2012   Procedure: TOTAL KNEE ARTHROPLASTY;  Surgeon: [name], MD;  Location: [facility] ORS;  Service: Orthopedics;  Laterality: Left;  . UVULOPALATOPHARYNGOPLASTY  2013  . vocal cord lesion bx      There were no vitals filed for this visit.  Subjective Assessment - 04/24/17 1151    Subjective  No new compaints. No new falls to report. Was seen in ED on Friday and cleared from recent fall with negative CT of head. They looked at his hip and back as well with on findings. Still sore today in all the areas affected by the fall.     Patient is accompained by:  Family member    Pertinent History  Pt goes by "[employer] hold activities;Walking    Patient Stated Goals  "I want to be able to walk a straight line and not fall down.  I want to stand up straight."     Currently in Pain?  Yes    Pain Score  4     Pain Location  Generalized    Pain Descriptors / Indicators  Aching;Tender;Sore    Pain Type  Acute pain    Pain Onset  In the past 7 days    Pain Frequency  Intermittent    Aggravating Factors   recent fall, movements of affected area    Pain Relieving Factors  nothing            [facility] Adult PT Treatment/Exercise - 04/24/17 1156      Transfers   Transfers  Sit to Stand;Stand to Sit    Sit to Stand  6: Modified independent (Device/Increase time);Without upper extremity assist;From chair/3-in-1    Five time sit to stand comments   15.41 sec's with no UE support using standard height chair    Stand to Sit  6: Modified independent (Device/Increase time);Without upper extremity assist;To chair/3-in-1       Berg Balance Test   Sit to Stand  Able to stand without using hands and stabilize independently    Standing Unsupported  Able to stand safely 2 minutes    Sitting with Back Unsupported but Feet Supported on Floor or Stool  Able to sit safely and securely 2 minutes    Stand to Sit  Sits safely with minimal use of hands    Transfers  Able to transfer safely, minor use of hands    Standing Unsupported with Eyes Closed  Able to stand 10 seconds safely    Standing Ubsupported with Feet  Together  Able to place feet together independently and stand 1 minute safely    From Standing, Reach Forward with Outstretched Arm  Can reach confidently >25 cm (10")    From Standing Position, Pick up Object from [place] to pick up shoe safely and easily    From Standing Position, Turn to Look Behind Over each Shoulder  Looks behind from both sides and weight shifts well    Turn 360 Degrees  Able to turn 360 degrees safely in 4 seconds or less    Standing Unsupported, Alternately Place Feet on Step/Stool  Able to stand independently and safely and complete 8 steps in 20 seconds    Standing Unsupported, One Foot in [place] to plae foot ahead of the other independently and hold 30 seconds    Standing on One Leg  Able to lift leg independently and hold 5-10 seconds    Total Score  54      issued the following to HEP today. Min guard assist with cues on technique.  Perform these at your counter or a dining table to hold onto for stability:   "I love a Parade" Lift   At counter for balance as needed: high knee marching forward and then backward. 3 second pauses with each knee lift.  Repeat 3 laps each way. Do _1-2_ sessions per day. http://gt2.exer.us/345   Copyright  VHI. All rights reserved. .  Feet Heel-Toe "Tandem"   At counter: Arms at sides, walk a straight line forward bringing one foot directly in front of the other, and then a straight line backwards bringing one foot directly behind the other  one.  Repeat for _3 laps each way. Do _1-2_ sessions per day.  Copyright  VHI. All rights reserved.    Perform these in a corner with a chair in front of you for safety:  Feet Together (Compliant Surface) Varied Arm Positions - Eyes Closed    Stand on compliant surface: pillow/s with feet together and arms at sides. Close eyes and visualize upright position. Hold _30_ seconds. Repeat _3_ times per session. Do _1-2_ sessions per day.  Copyright  VHI. All rights reserved.    Feet Apart (Compliant Surface) Head Motion - Eyes Closed    Stand on compliant surface: _pillow/s_ with feet shoulder width apart. Close eyes and move head slowly: 1. Up and down x 10 reps 2. Left and right  10 reps 3. Diagonal by looking up to the right/down to the left x 10 reps 4. Diagonal by looking up to the left/down to the right x 10 reps  Do _1-2_ sessions per day.  Copyright  VHI. All rights reserved.        PT Education - 04/24/17 1223    Education provided  Yes    Education Details  progress toward STGs and updated/advanced HEP     Person(s) Educated  Patient    Methods  Explanation;Demonstration;Verbal cues;Handout    Comprehension  Verbalized understanding;Returned demonstration;Verbal cues required;Need further instruction       PT Short Term Goals - 04/24/17 1154      PT SHORT TERM GOAL #1   Title  Pt will initiate HEP in order to indicate improved functional mobility and decreased fall risk.  (Target Date: 04/22/17)    Baseline  04/24/17: going well when he does them, inconsistent with compliance due to busy schedule. updated HEP today.     Status  Achieved      PT SHORT TERM  GOAL #2   Title  Will assess gait speed and set appropriate LTG.     Baseline  3.36 ft/sec without AD on 03/30/17    Status  Achieved      PT SHORT TERM GOAL #3   Title  Will assess BERG and improve score 4 points from baseline in order to indicate decreased fall risk.      Baseline  04/24/17: 54/56  (45/56 baseline)    Time  --    Period  --    Status  Achieved      PT SHORT TERM GOAL #4   Title  Will assess varying AD as appropriate to decrease fall risk.      Baseline  04/24/17: has been assessed with straight cane, howver pt refuses to use AD at this time.    Time  --    Status  Achieved      PT SHORT TERM GOAL #5   Title  Pt will perform 5TSS <20 secs without UE support in order to indicate improved functional strength.      Baseline  04/24/17: 15.41 sec's no UE support    Time  --    Period  --    Status  Achieved        PT Long Term Goals - 04/25/17 1119      PT LONG TERM GOAL #1   Title  Pt will be independent with HEP (with assist from wife as needed) in order to indicate improved functional mobility and decreased fall risk.  (Target Date: 05/22/17)    Time  8    Period  Weeks    Status  On-going      PT LONG TERM GOAL #2   Title  Pt will improve BERG balance score by 8 points from baseline in order to indicate decreased fall risk.      Baseline  04/24/17: met today with 54/56 score (45/56 baseline)    Status  Achieved      PT LONG TERM GOAL #3   Title  Pt will perform 5TSS <17 secs without UE support in order to indicate improved functional strength.      Baseline  04/24/17: met today with score of 15.41 sec's    Status  Achieved      PT LONG TERM GOAL #4   Title  Pt will ambulate up to 1000' over unlevel paved outdoor surfaces at mod I level with LRAD in order to indicate safe community negotiation.      Time  8    Period  Weeks    Status  On-going      PT LONG TERM GOAL #5   Title  Pt will verbalize initiation of community fitness program in order to maintain gains made in therapy.      Time  8    Period  Weeks    Status  On-going            Plan - 04/24/17 1730    Clinical Impression Statement  Today's skilled session focused on assessing progress toward goals with all STGs met to date and 2 LTGs met as well (pt's Berg Balance test and 5 time sit to  stand test scores). Remainder of session focused on HEP with it condnensed and advanced today. No issues reported with performance in session today. Pt is progressing toward goals and should benefit from continued PT to progress toward unmet goals.     Rehab Potential  Fair  Clinical Impairments Affecting Rehab Potential  ETOH abuse and underlying depression     PT Frequency  2x / week    PT Duration  8 weeks    PT Treatment/Interventions  ADLs/Self Care Home Management;DME Instruction;Gait training;Stair training;Functional mobility training;Therapeutic activities;Therapeutic exercise;Balance training;Neuromuscular re-education;Patient/family education;Orthotic Fit/Training;Vestibular;Passive range of motion    PT Next Visit Plan  continue with high level balance: uneven/compliant surfaces, single leg stance and vision removed.    Consulted and Agree with Plan of Care  Patient       Patient will benefit from skilled therapeutic intervention in order to improve the following deficits and impairments:  Abnormal gait, Decreased activity tolerance, Decreased balance, Decreased cognition, Decreased endurance, Decreased knowledge of use of DME, Decreased mobility, Decreased strength, Difficulty walking, Impaired perceived functional ability, Impaired flexibility, Postural dysfunction  Visit Diagnosis: Unsteadiness on feet  Muscle weakness (generalized)  Other abnormalities of gait and mobility  Repeated falls     Problem List Patient Active Problem List   Diagnosis Date Noted  . Pneumonia of right lower lobe due to infectious organism ([place]) 03/25/2017  . Type 2 diabetes mellitus with complication, without long-term current use of insulin ([place]) 11/19/2016  . Chronic right-sided thoracic back pain 08/15/2016  . DDD (degenerative disc disease), thoracic 06/08/2016  . Depression with anxiety 05/31/2016  . 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 Bronchiectasis without acute  exacerbation ([place]) 12/31/2015  . Chronic alcoholic hepatitis 58/59/2924  . Obstructive chronic bronchitis without exacerbation ([place]) 11/12/2015  . Spinal stenosis in cervical region 10/02/2015  . Gallbladder sludge 03/02/2015  . Other iron deficiency anemias 11/13/2013  . B12 deficiency anemia 11/13/2013  . Encounter for therapeutic drug monitoring 02/25/2013  . Long term (current) use of anticoagulants 05/12/2012  . [employer] 11/01/2011  . Chronic systolic heart failure ([place]) 08/28/2011  . Vocal cord nodule 05/19/2011  . Lumbar spinal stenosis 03/03/2011  . Seasonal and perennial allergic rhinitis 04/15/2009  . Insomnia 02/11/2009  . Obstructive sleep apnea 11/10/2008  . Hyperlipidemia with target LDL less than 100 02/14/2008  . GERD 02/14/2008  . ESOPHAGEAL STRICTURE 01/28/2008  . Essential hypertension 11/16/2006  . Coronary atherosclerosis 11/16/2006  . Atrial fibrillation ([place]) 11/16/2006    Willow Ora, PTA, [facility] [address], [place], [place] [address] [phone] 04/25/17, 11:27 AM   Name: Corey [name] MRN: [id number] Date of Birth: Sep 25, 1935

## 2017-04-25 NOTE — Patient Instructions (Signed)
Head Injury, Adult  There are many types of head injuries. Head injuries can be as minor as a bump, or they can be more severe. More severe head injuries include:   A jarring injury to the brain (concussion).   A bruise of the brain (contusion). This means there is bleeding in the brain that can cause swelling.   A cracked skull (skull fracture).   Bleeding in the brain that collects, clots, and forms a bump (hematoma).    After a head injury, you may need to be observed for a while in the emergency department or urgent care. Sometimes admission to the hospital is needed.  After a head injury has happened, most problems occur within the first 24 hours, but side effects may occur up to 7-10 days after the injury. It is important to watch your condition for any changes.  What are the causes?  There are many possible causes of a head injury. A serious head injury may happen to someone who is in a car accident (motor vehicle collision). Other causes of major head injuries include bicycle or motorcycle accidents, sports injuries, and falls.  Risk factors  This condition is more likely to occur in people who:   Drink a lot of alcohol or use drugs.   Are over the age of 65.   Are at risk for falls.    What are the symptoms?  There are many possible symptoms of a head injury. Visible symptoms of a head injury include a bruise, bump, or bleeding at the site of the injury. Other non-visible symptoms include:   Feeling sleepy or not being able to stay awake.   Passing out.   Headache.   Seizures.   Dizziness.   Confusion.   Memory problems.   Nausea or vomiting.    Other possible symptoms that may develop after the head injury include:   Poor attention and concentration.   Fatigue or tiring easily.   Irritability.   Being uncomfortable around bright lights or loud noises.   Anxiety or depression.   Disturbed sleep.    How is this diagnosed?  This condition can usually be diagnosed based on your symptoms, a  description of the injury, and a physical exam. You may also have imaging tests done, such as a CT scan or MRI. You will also be closely watched.  How is this treated?  Treatment for this condition depends on the severity and type of injury you have. The main goal of treatment is to prevent complications and allow the brain time to heal.  For mild head injury, you may be sent home and treatment may include:   Observation. A responsible adult should stay with you for 24 hours after your injury and check on you often.   Physical rest.   Brain rest.   Pain medicines.    For severe brain injury, treatment may include:   Close observation. This includes hospitalization with frequent physical exams. You may need to go to a hospital that specializes in head injury.   Pain medicines.   Breathing support. This may include using a ventilator.   Managing the pressure inside the brain (intracranial pressure, or ICP). This may include:  ? Monitoring the ICP.  ? Giving medicines to decrease the ICP.  ? Positioning you to decrease the ICP.   Medicine to prevent seizures.   Surgery to stop bleeding or to remove blood clots (craniotomy).   Surgery to remove part of the skull (decompressive   craniectomy). This allows room for the brain to swell.    Follow these instructions at home:  Activity   Rest as much as possible and avoid activities that are physically hard or tiring.   Make sure you get enough sleep.   Limit activities that require a lot of thought or attention, such as:  ? Watching TV.  ? Playing memory games and puzzles.  ? Job-related work or homework.  ? Working on the computer, social media, and texting.   Avoid activities that could cause another head injury, such as playing sports, until your health care provider approves. Having another head injury, especially before the first one has healed, can be dangerous.   Ask your health care provider when it is safe for you to return to your regular activities,  including work or school. Ask your health care provider for a step-by-step plan for gradually returning to activities.   Ask your health care provider when you can drive, ride a bicycle, or use heavy machinery. Your ability to react may be slower after a brain injury. Never do these activities if you are dizzy.    Lifestyle   Do not drink alcohol until your health care provider approves, and avoid drug use. Alcohol and certain drugs may slow your recovery and can put you at risk of further injury.   If it is harder than usual to remember things, write them down.   If you are easily distracted, try to do one thing at a time.   Talk with family members or close friends when making important decisions.   Tell your friends, family, a trusted colleague, and work manager about your injury, symptoms, and restrictions. Have them watch for any new or worsening problems.    General instructions   Take over-the-counter and prescription medicines only as told by your health care provider.   Have someone stay with you for 24 hours after your head injury. This person should watch you for any changes in your symptoms and be ready to seek medical help, as needed.   Keep all follow-up visits as told by your health care provider. This is important.    Prevention   Work on improving your balance and strength to avoid falls.   Wear a seatbelt when you are in a moving vehicle.   Wear a helmet when riding a bicycle, skiing, or doing any other sport or activity that has a risk of injury.   Drink alcohol only in moderation.   Take safety measures in your home, such as:  ? Removing clutter and tripping hazards from floors and stairways.  ? Using grab bars in bathrooms and handrails by stairs.  ? Placing non-slip mats on floors and in bathtubs.  ? Improving lighting in dim areas.  Get help right away if:   You have:  ? A severe headache that is not helped by medicine.  ? Trouble walking, have weakness in your arms and legs, or  lose your balance.  ? Clear or bloody fluid coming from your nose or ears.  ? Changes in your vision.  ? A seizure.   You vomit.   Your symptoms get worse.   Your speech is slurred.   You pass out.   You are sleepier and have trouble staying awake.   Your pupils change size.  These symptoms may represent a serious problem that is an emergency. Do not wait to see if the symptoms will go away. Get medical help right away.

## 2017-04-26 ENCOUNTER — Ambulatory Visit: Payer: Medicare Other | Admitting: Physical Therapy

## 2017-04-26 ENCOUNTER — Encounter: Payer: Self-pay | Admitting: Physical Therapy

## 2017-04-26 DIAGNOSIS — R2681 Unsteadiness on feet: Secondary | ICD-10-CM | POA: Diagnosis not present

## 2017-04-26 DIAGNOSIS — M6281 Muscle weakness (generalized): Secondary | ICD-10-CM

## 2017-04-26 DIAGNOSIS — R296 Repeated falls: Secondary | ICD-10-CM

## 2017-04-26 DIAGNOSIS — R2689 Other abnormalities of gait and mobility: Secondary | ICD-10-CM | POA: Diagnosis not present

## 2017-04-26 DIAGNOSIS — R293 Abnormal posture: Secondary | ICD-10-CM | POA: Diagnosis not present

## 2017-04-27 ENCOUNTER — Encounter: Payer: Medicare Other | Attending: Internal Medicine | Admitting: Dietician

## 2017-04-27 ENCOUNTER — Encounter: Payer: Self-pay | Admitting: Dietician

## 2017-04-27 DIAGNOSIS — Z713 Dietary counseling and surveillance: Secondary | ICD-10-CM | POA: Diagnosis not present

## 2017-04-27 DIAGNOSIS — D1801 Hemangioma of skin and subcutaneous tissue: Secondary | ICD-10-CM | POA: Diagnosis not present

## 2017-04-27 DIAGNOSIS — D485 Neoplasm of uncertain behavior of skin: Secondary | ICD-10-CM | POA: Diagnosis not present

## 2017-04-27 DIAGNOSIS — L570 Actinic keratosis: Secondary | ICD-10-CM | POA: Diagnosis not present

## 2017-04-27 DIAGNOSIS — L821 Other seborrheic keratosis: Secondary | ICD-10-CM | POA: Diagnosis not present

## 2017-04-27 DIAGNOSIS — Z85828 Personal history of other malignant neoplasm of skin: Secondary | ICD-10-CM | POA: Diagnosis not present

## 2017-04-27 DIAGNOSIS — D225 Melanocytic nevi of trunk: Secondary | ICD-10-CM | POA: Diagnosis not present

## 2017-04-27 DIAGNOSIS — E118 Type 2 diabetes mellitus with unspecified complications: Secondary | ICD-10-CM | POA: Insufficient documentation

## 2017-04-27 DIAGNOSIS — D692 Other nonthrombocytopenic purpura: Secondary | ICD-10-CM | POA: Diagnosis not present

## 2017-04-27 DIAGNOSIS — E119 Type 2 diabetes mellitus without complications: Secondary | ICD-10-CM

## 2017-04-27 DIAGNOSIS — L308 Other specified dermatitis: Secondary | ICD-10-CM | POA: Diagnosis not present

## 2017-04-27 NOTE — Therapy (Signed)
[place] [address] [place], [place], [address] Phone: [phone]   Fax:  [phone]  Physical Therapy Treatment  Patient Details  Name: Corey [name] MRN: [id number] Date of Birth: [birth year] Referring Provider: Scarlette Calico, MD   Encounter Date: 04/26/2017   04/26/17 1019  PT Visits / Re-Eval  Visit Number 8  Number of Visits 17  Date for PT Re-Evaluation 05/22/17  Authorization  Authorization Type Medicare  PT Time Calculation  PT Start Time 1018  PT Stop Time 1057  PT Time Calculation (min) 39 min  PT - End of Session  Equipment Utilized During Treatment Gait belt  Activity Tolerance Patient tolerated treatment well  Behavior During Therapy [facility] for tasks assessed/performed      Past Medical History:  Diagnosis Date  . Alcohol abuse, episodic 05/19/2008  . ALLERGIC RHINITIS 04/15/2009  . Allergy    YEAR ROUND,TAKE SHOTS  . Alzheimer's dementia    beginning with sx  . Anxiety   . Arthritis   . ASTHMA 11/16/2006   sinusitis-    hx ?yeast patch/white patch on vocal cord as per [facility] 02/25/11-   . Atrial fibrillation ([place]) 11/16/2006   remote CHF related to atrial fib with rapid ventricular response over 25 yrs per office note,  . Cancer [facility]) 2002   prostate cancer  . Chronic systolic CHF (congestive heart failure) (HCC)    echo (10/02/12): EF 82-99%, grade 1 diastolic dysfunction, trivial AI, mild MR, mild RVE  . COLONIC POLYPS, ADENOMATOUS, HX OF 02/14/2008  . CORONARY ARTERY DISEASE 11/16/2006   LHC (07/2011): LAD with luminal irregularities, proximal circumflex 50%, EF 25%  . Depression   . DIVERTICULOSIS, COLON 02/14/2008  . ESOPHAGEAL STRICTURE 01/28/2008  . GERD 02/14/2008  . Headache(784.0)   . HYPERGLYCEMIA 11/16/2006  . HYPERLIPIDEMIA 02/14/2008  . HYPERTENSION 11/16/2006  . ICD (implantable cardiac defibrillator) in place   . INSOMNIA-SLEEP DISORDER-UNSPEC 02/11/2009  . Lesion of vocal  cord    bx begine  . Myocardial infarction ([place]) 1993  . NICM (nonischemic cardiomyopathy) ([place])   . OBSTRUCTIVE SLEEP APNEA 11/10/2008  . 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 Pacemaker    St. Jude  . Pneumonia    hx of  . PROSTATE CANCER, HX OF 02/25/2000  . SLEEP APNEA 10/03/2009   LOV Dr [name] 12/12 in EPIC    Moderate per patient- settings ?6/last sleep study years ago  . Sleep apnea   . Stroke ([place])    Tia  . Symptomatic bradycardia, secondary to sinus node dysfunction 07/08/2011   s/p [facility] Scientific PPM by Dr Sallyanne Kuster, upgraded to [place] ICD 12/13 by Dr Rayann Heman (SJM)  . TRANSIENT ISCHEMIC ATTACKS, HX OF 11/16/2006  . Type 2 diabetes mellitus with complication, without long-term current use of insulin ([place]) 11/19/2016    Past Surgical History:  Procedure Laterality Date  . BACK SURGERY    . BI-VENTRICULAR IMPLANTABLE CARDIOVERTER DEFIBRILLATOR UPGRADE N/A 01/16/2012   Procedure: BI-VENTRICULAR IMPLANTABLE CARDIOVERTER DEFIBRILLATOR UPGRADE;  Surgeon: Thompson Grayer, MD;  Location: [facility] CATH LAB;  Service: Cardiovascular;  Laterality: N/A;  . CARDIAC CATHETERIZATION     several  . CARDIAC DEFIBRILLATOR PLACEMENT  01/16/12   Upgrade to a biventricular SJM ICD by DR Allred  . COLONOSCOPY    . ESOPHAGOGASTRODUODENOSCOPY     with dilitation  . INSERT / REPLACE / REMOVE PACEMAKER    . KNEE ARTHROSCOPY     2 surgeries right and 1 left  . LEFT  AND RIGHT HEART CATHETERIZATION WITH CORONARY ANGIOGRAM N/A 08/18/2011   Procedure: LEFT AND RIGHT HEART CATHETERIZATION WITH CORONARY ANGIOGRAM;  Surgeon: [name], MD;  Location: [place] CATH LAB;  Service: Cardiovascular;  Laterality: N/A;  . LUMBAR LAMINECTOMY/DECOMPRESSION MICRODISCECTOMY  03/02/2011   Procedure: LUMBAR LAMINECTOMY/DECOMPRESSION MICRODISCECTOMY;  Surgeon: [name], MD;  Location: [facility] ORS;  Service: Orthopedics;  Laterality: N/A;  Decompression Lumbar 4 - Lumbar(X-Ray)  . PACEMAKER INSERTION  06/2011   [place]  Scientific PPM implanted by Dr Sallyanne Kuster  . PERMANENT PACEMAKER INSERTION Left 07/08/2011   Procedure: PERMANENT PACEMAKER INSERTION;  Surgeon: [name], MD;  Location: [facility] CATH LAB;  Service: Cardiovascular;  Laterality: Left;  . PROSTATE SURGERY  2/02   prostate cancer  . TONSILECTOMY, ADENOIDECTOMY, BILATERAL MYRINGOTOMY AND TUBES    . TONSILLECTOMY  as child  . TOTAL KNEE ARTHROPLASTY Left 06/04/2012   Procedure: TOTAL KNEE ARTHROPLASTY;  Surgeon: [name], MD;  Location: [facility] ORS;  Service: Orthopedics;  Laterality: Left;  . UVULOPALATOPHARYNGOPLASTY  2013  . vocal cord lesion bx      There were no vitals filed for this visit.      04/26/17 1018  Symptoms/Limitations  Subjective No new complaints. Still a little sore from fall last week, otherwise no pain to report.   Pertinent History Pt goes by "[employer] hold activities;Walking  Patient Stated Goals "I want to be able to walk a straight line and not fall down.  I want to stand up straight."   Pain Assessment  Currently in Pain? No/denies  Pain Score 0      04/26/17 1019  Transfers  Transfers Sit to Stand;Stand to Sit  Sit to Stand 6: Modified independent (Device/Increase time);Without upper extremity assist;From chair/3-in-1  Stand to Sit 6: Modified independent (Device/Increase time);Without upper extremity assist;To chair/3-in-1  High Level Balance  High Level Balance Activities Marching forwards;Marching backwards;Tandem walking (tandem/heel/toe walking fwd/bwd)  High Level Balance Comments on red mats next to counter top with no UE support/occasional touch to counter: 3-4 laps each with min guard to min assist for balance, cues on ex form/technique.   Neuro Re-ed   Neuro Re-ed Details  for strengthening/balance: sit<>stands with feet across dome part of black half foam roll x 5 reps. cues needed for weight shifting, full upright standing and for slow, controlled descent. min assist at times  needed      04/26/17 1031  Balance Exercises: Standing  SLS with Vectors Foam/compliant surface;Limitations  Rockerboard Anterior/posterior;Lateral;Head turns;EC;EO;30 seconds;10 reps  Balance Beam standing across blue foam beam: alternating fwd heel taps, than alternating bwd toe taps x 10 reps each with up to min assist for balance. cues on posture, step length and weight shifting.                                       Balance Exercises: Standing  SLS with Vectors Limitations 6 cones along edges of both red mats: alternating toe taps to each with side stepping left<>right x 1 lap each way, progressing to alternating double toe taps to each cone with side stepping left<>right x 1 lap each. min assist for balance with occasional UE touch to counter for balance.   Rebounder Limitations performed both ways on balance board with no UE support to occasional touch to bars: EO rocking board with emphasis on tall posture, progressing to holding the board steady  for EC no head movements, progressing to holding board steady for EC head movements left/right, up/down and diagonals both ways.       PT Short Term Goals - 04/24/17 1154      PT SHORT TERM GOAL #1   Title  Pt will initiate HEP in order to indicate improved functional mobility and decreased fall risk.  (Target Date: 04/22/17)    Baseline  04/24/17: going well when he does them, inconsistent with compliance due to busy schedule. updated HEP today.     Status  Achieved      PT SHORT TERM GOAL #2   Title  Will assess gait speed and set appropriate LTG.     Baseline  3.36 ft/sec without AD on 03/30/17    Status  Achieved      PT SHORT TERM GOAL #3   Title  Will assess BERG and improve score 4 points from baseline in order to indicate decreased fall risk.      Baseline  04/24/17: 54/56 (45/56 baseline)    Time  --    Period  --    Status  Achieved      PT SHORT TERM GOAL #4   Title  Will assess varying AD as appropriate to decrease fall risk.       Baseline  04/24/17: has been assessed with straight cane, howver pt refuses to use AD at this time.    Time  --    Status  Achieved      PT SHORT TERM GOAL #5   Title  Pt will perform 5TSS <20 secs without UE support in order to indicate improved functional strength.      Baseline  04/24/17: 15.41 sec's no UE support    Time  --    Period  --    Status  Achieved        PT Long Term Goals - 04/25/17 1119      PT LONG TERM GOAL #1   Title  Pt will be independent with HEP (with assist from wife as needed) in order to indicate improved functional mobility and decreased fall risk.  (Target Date: 05/22/17)    Time  8    Period  Weeks    Status  On-going      PT LONG TERM GOAL #2   Title  Pt will improve BERG balance score by 8 points from baseline in order to indicate decreased fall risk.      Baseline  04/24/17: met today with 54/56 score (45/56 baseline)    Status  Achieved      PT LONG TERM GOAL #3   Title  Pt will perform 5TSS <17 secs without UE support in order to indicate improved functional strength.      Baseline  04/24/17: met today with score of 15.41 sec's    Status  Achieved      PT LONG TERM GOAL #4   Title  Pt will ambulate up to 1000' over unlevel paved outdoor surfaces at mod I level with LRAD in order to indicate safe community negotiation.      Time  8    Period  Weeks    Status  On-going      PT LONG TERM GOAL #5   Title  Pt will verbalize initiation of community fitness program in order to maintain gains made in therapy.      Time  8    Period  Weeks    Status  On-going         04/26/17 1019  Plan  Clinical Impression Statement Today's skilled session continued to focus on high level balance and strengthening without any issues reported. Pt is progressing toward goals and should benefit from continued PT to progress toward unmet goals.  Pt will benefit from skilled therapeutic intervention in order to improve on the following deficits Abnormal  gait;Decreased activity tolerance;Decreased balance;Decreased cognition;Decreased endurance;Decreased knowledge of use of DME;Decreased mobility;Decreased strength;Difficulty walking;Impaired perceived functional ability;Impaired flexibility;Postural dysfunction  Rehab Potential Fair  Clinical Impairments Affecting Rehab Potential ETOH abuse and underlying depression   PT Frequency 2x / week  PT Duration 8 weeks  PT Treatment/Interventions ADLs/Self Care Home Management;DME Instruction;Gait training;Stair training;Functional mobility training;Therapeutic activities;Therapeutic exercise;Balance training;Neuromuscular re-education;Patient/family education;Orthotic Fit/Training;Vestibular;Passive range of motion  PT Next Visit Plan continue with high level balance: uneven/compliant surfaces, single leg stance and vision removed.  Consulted and Agree with Plan of Care Patient       Patient will benefit from skilled therapeutic intervention in order to improve the following deficits and impairments:  Abnormal gait, Decreased activity tolerance, Decreased balance, Decreased cognition, Decreased endurance, Decreased knowledge of use of DME, Decreased mobility, Decreased strength, Difficulty walking, Impaired perceived functional ability, Impaired flexibility, Postural dysfunction  Visit Diagnosis: Unsteadiness on feet  Muscle weakness (generalized)  Other abnormalities of gait and mobility  Repeated falls     Problem List Patient Active Problem List   Diagnosis Date Noted  . Pneumonia of right lower lobe due to infectious organism ([place]) 03/25/2017  . Type 2 diabetes mellitus with complication, without long-term current use of insulin ([place]) 11/19/2016  . Chronic right-sided thoracic back pain 08/15/2016  . DDD (degenerative disc disease), thoracic 06/08/2016  . Depression with anxiety 05/31/2016  . 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 Bronchiectasis without acute exacerbation  ([place]) 12/31/2015  . Chronic alcoholic hepatitis 22/48/2500  . Obstructive chronic bronchitis without exacerbation ([place]) 11/12/2015  . Spinal stenosis in cervical region 10/02/2015  . Gallbladder sludge 03/02/2015  . Head injury 01/28/2014  . Other iron deficiency anemias 11/13/2013  . B12 deficiency anemia 11/13/2013  . Encounter for therapeutic drug monitoring 02/25/2013  . Long term (current) use of anticoagulants 05/12/2012  . [employer] 11/01/2011  . Chronic systolic heart failure ([place]) 08/28/2011  . Vocal cord nodule 05/19/2011  . Lumbar spinal stenosis 03/03/2011  . Seasonal and perennial allergic rhinitis 04/15/2009  . Insomnia 02/11/2009  . Obstructive sleep apnea 11/10/2008  . Hyperlipidemia with target LDL less than 100 02/14/2008  . GERD 02/14/2008  . ESOPHAGEAL STRICTURE 01/28/2008  . Essential hypertension 11/16/2006  . Coronary atherosclerosis 11/16/2006  . Atrial fibrillation ([place]) 11/16/2006    Willow Ora, PTA, [facility] [address], [place], [place] [address] [phone] 04/27/17, 10:51 PM   Name: Corey [name] MRN: [id number] Date of Birth: [birth year]

## 2017-04-27 NOTE — Progress Notes (Signed)
Patient was seen on 04/27/17 for the first of a series of three diabetes self-management courses at [facility].  Patient Education Plan per assessed needs and concerns is to attend three course education program for Diabetes Self Management Education.  The following learning objectives were met by the patient during this class:  Describe diabetes  State some common risk factors for diabetes  Defines the role of glucose and insulin  Identifies type of diabetes and pathophysiology  Describe the relationship between diabetes and cardiovascular risk  State the members of the Healthcare Team  States the rationale for glucose monitoring  State when to test glucose  State their individual Target Range  State the importance of logging glucose readings  Describe how to interpret glucose readings  Identifies A1C target  Explain the correlation between A1c and eAG values  State symptoms and treatment of high blood glucose  State symptoms and treatment of low blood glucose  Explain proper technique for glucose testing  Identifies proper sharps disposal  Handouts given during class include:  Living Well with Diabetes book  Carb Counting and Meal Planning book  Meal Plan Card  Meal planning worksheet  Low Sodium Flavoring Tips  Types of Fats  The diabetes portion plate  B6L to eAG Conversion Chart  Diabetes Recommended Care Schedule  Support Group  Diabetes Success Plan  Core Class Satisfaction Survey   Follow-Up Plan:  Attend core 2

## 2017-04-28 ENCOUNTER — Other Ambulatory Visit: Payer: Self-pay | Admitting: Internal Medicine

## 2017-04-28 NOTE — Progress Notes (Signed)
No ICM remote transmission received for 04/17/2017 and next ICM transmission scheduled for 05/11/2017.

## 2017-05-01 ENCOUNTER — Ambulatory Visit: Payer: Medicare Other | Admitting: Physical Therapy

## 2017-05-01 ENCOUNTER — Encounter: Payer: Self-pay | Admitting: Gastroenterology

## 2017-05-01 ENCOUNTER — Ambulatory Visit (INDEPENDENT_AMBULATORY_CARE_PROVIDER_SITE_OTHER): Payer: Medicare Other | Admitting: Gastroenterology

## 2017-05-01 ENCOUNTER — Other Ambulatory Visit (INDEPENDENT_AMBULATORY_CARE_PROVIDER_SITE_OTHER): Payer: Medicare Other

## 2017-05-01 ENCOUNTER — Encounter: Payer: Self-pay | Admitting: Physical Therapy

## 2017-05-01 VITALS — BP 112/60 | HR 64 | Ht 72.0 in | Wt 200.0 lb

## 2017-05-01 DIAGNOSIS — K709 Alcoholic liver disease, unspecified: Secondary | ICD-10-CM | POA: Diagnosis not present

## 2017-05-01 DIAGNOSIS — M6281 Muscle weakness (generalized): Secondary | ICD-10-CM

## 2017-05-01 DIAGNOSIS — R2681 Unsteadiness on feet: Secondary | ICD-10-CM | POA: Diagnosis not present

## 2017-05-01 DIAGNOSIS — R194 Change in bowel habit: Secondary | ICD-10-CM

## 2017-05-01 DIAGNOSIS — R296 Repeated falls: Secondary | ICD-10-CM

## 2017-05-01 DIAGNOSIS — R2689 Other abnormalities of gait and mobility: Secondary | ICD-10-CM

## 2017-05-01 DIAGNOSIS — R293 Abnormal posture: Secondary | ICD-10-CM | POA: Diagnosis not present

## 2017-05-01 LAB — HEPATIC FUNCTION PANEL
ALBUMIN: 3.8 g/dL (ref 3.5–5.2)
ALT: 36 U/L (ref 0–53)
AST: 55 U/L — ABNORMAL HIGH (ref 0–37)
Alkaline Phosphatase: 96 U/L (ref 39–117)
Bilirubin, Direct: 0.1 mg/dL (ref 0.0–0.3)
TOTAL PROTEIN: 7.2 g/dL (ref 6.0–8.3)
Total Bilirubin: 0.5 mg/dL (ref 0.2–1.2)

## 2017-05-01 NOTE — Progress Notes (Signed)
HPI :  82 y/o male here for a follow up visit.   Elevated liver enzymes - thought to be due to fatty liver secondary to alcohol use. Labs sent for viral hepatitis not immune to hep A/B, sent for vaccine. He was told to stop drinking alcohol. AST rose from 64 to 151, with ALT of 81. CK level sent and normal. He stopped drinking and AST went down to 102, ALT 68.  ANA wa positive at 1:40, AMA negative, SMA negative, IgG normal, Prior iron studies okay. He states he previously drank at least 4 drinks a night, now drinking usually 2 shots of whisky per night. We have discussed the need to stop drinking alcohol in the past, he has not been able to do this. Of note, he reports taking amiodarone for a few years. Most recent labs February ALT 96, AST 205, AP 130. The AST elevation is higher than usual. He feels well, no juandice, no edema / ascites, no new meds. No abdominal pains.   He reports his bowels continue to fluctuate at times between hard and loose stools. He tried Citrucel which has helped. No blood in the stools. Bowels not bothering him too much recently on this regimen.   Prior workup for issues in the past: EGD 06/18/14 - bleeding small gastric polyps removed EGD 01/22/14 - bleeding large gastric polyp removed Colonoscopy 1/15 - severe diverticulosis left colon, hemorrhoids Flex sig 12/2013 - severe left sided diverticulosis, hemorrhoids CT abdomen 10/05/14 - normal liver, GB, pancreas. Diverticulosis noted US abdomen 05/30/14 - no gallstones, normal GB, fatty liver, normal spleen US 06/17/16 - diffuse hepatic steatosis, no cirrhosis    Past Medical History:  Diagnosis Date  . Alcohol abuse, episodic 05/19/2008  . ALLERGIC RHINITIS 04/15/2009  . Allergy    YEAR ROUND,TAKE SHOTS  . Alzheimer's dementia    beginning with sx  . Anxiety   . Arthritis   . ASTHMA 11/16/2006   sinusitis-    hx ?yeast patch/white patch on vocal cord as per [facility] 02/25/11-   . Atrial fibrillation ([place])  11/16/2006   remote CHF related to atrial fib with rapid ventricular response over 25 yrs per office note,  . Cancer [facility]) 2002   prostate cancer  . Chronic systolic CHF (congestive heart failure) (HCC)    echo (10/02/12): EF 16-10%, grade 1 diastolic dysfunction, trivial AI, mild MR, mild RVE  . COLONIC POLYPS, ADENOMATOUS, HX OF 02/14/2008  . CORONARY ARTERY DISEASE 11/16/2006   LHC (07/2011): LAD with luminal irregularities, proximal circumflex 50%, EF 25%  . Depression   . DIVERTICULOSIS, COLON 02/14/2008  . ESOPHAGEAL STRICTURE 01/28/2008  . GERD 02/14/2008  . Headache(784.0)   . HYPERGLYCEMIA 11/16/2006  . HYPERLIPIDEMIA 02/14/2008  . HYPERTENSION 11/16/2006  . ICD (implantable cardiac defibrillator) in place   . INSOMNIA-SLEEP DISORDER-UNSPEC 02/11/2009  . Lesion of vocal cord    bx begine  . Myocardial infarction ([place]) 1993  . NICM (nonischemic cardiomyopathy) ([place])   . OBSTRUCTIVE SLEEP APNEA 11/10/2008  . 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 Pacemaker    St. Jude  . Pneumonia    hx of  . PROSTATE CANCER, HX OF 02/25/2000  . SLEEP APNEA 10/03/2009   LOV Dr [name] 12/12 in EPIC    Moderate per patient- settings ?6/last sleep study years ago  . Sleep apnea   . Stroke ([place])    Tia  . Symptomatic bradycardia, secondary to sinus node dysfunction 07/08/2011   s/p [employer] PPM  by Dr Sallyanne Kuster, upgraded to BiV ICD 12/13 by Dr Rayann Heman (SJM)  . TRANSIENT ISCHEMIC ATTACKS, HX OF 11/16/2006  . Type 2 diabetes mellitus with complication, without long-term current use of insulin ([place]) 11/19/2016     Past Surgical History:  Procedure Laterality Date  . BACK SURGERY    . BI-VENTRICULAR IMPLANTABLE CARDIOVERTER DEFIBRILLATOR UPGRADE N/A 01/16/2012   Procedure: BI-VENTRICULAR IMPLANTABLE CARDIOVERTER DEFIBRILLATOR UPGRADE;  Surgeon: Thompson Grayer, MD;  Location: [facility] CATH LAB;  Service: Cardiovascular;  Laterality: N/A;  . CARDIAC CATHETERIZATION     several  . CARDIAC DEFIBRILLATOR  PLACEMENT  01/16/12   Upgrade to a biventricular SJM ICD by DR Allred  . COLONOSCOPY    . ESOPHAGOGASTRODUODENOSCOPY     with dilitation  . INSERT / REPLACE / REMOVE PACEMAKER    . KNEE ARTHROSCOPY     2 surgeries right and 1 left  . LEFT AND RIGHT HEART CATHETERIZATION WITH CORONARY ANGIOGRAM N/A 08/18/2011   Procedure: LEFT AND RIGHT HEART CATHETERIZATION WITH CORONARY ANGIOGRAM;  Surgeon: [name], MD;  Location: [place] CATH LAB;  Service: Cardiovascular;  Laterality: N/A;  . LUMBAR LAMINECTOMY/DECOMPRESSION MICRODISCECTOMY  03/02/2011   Procedure: LUMBAR LAMINECTOMY/DECOMPRESSION MICRODISCECTOMY;  Surgeon: [name], MD;  Location: [facility];  Service: Orthopedics;  Laterality: N/A;  Decompression Lumbar 4 - Lumbar(X-Ray)  . PACEMAKER INSERTION  06/2011   [employer] PPM implanted by Dr Sallyanne Kuster  . PERMANENT PACEMAKER INSERTION Left 07/08/2011   Procedure: PERMANENT PACEMAKER INSERTION;  Surgeon: [name], MD;  Location: [place] CATH LAB;  Service: Cardiovascular;  Laterality: Left;  . PROSTATE SURGERY  2/02   prostate cancer  . TONSILECTOMY, ADENOIDECTOMY, BILATERAL MYRINGOTOMY AND TUBES    . TONSILLECTOMY  as child  . TOTAL KNEE ARTHROPLASTY Left 06/04/2012   Procedure: TOTAL KNEE ARTHROPLASTY;  Surgeon: [name], MD;  Location: [facility];  Service: Orthopedics;  Laterality: Left;  . UVULOPALATOPHARYNGOPLASTY  2013  . vocal cord lesion bx     Family History  Problem Relation Age of Onset  . Heart attack Father   . Heart attack Brother   . Heart disease Maternal Uncle   . Heart attack Maternal Uncle   . Colon cancer Neg Hx   . Esophageal cancer Neg Hx   . Rectal cancer Neg Hx   . Prostate cancer Neg Hx   . Stomach cancer Neg Hx    Social History   Tobacco Use  . Smoking status: Former Smoker    Packs/day: 1.00    Years: 4.00    Pack years: 4.00    Types: Cigarettes    Last attempt to quit: 03/22/1964    Years since quitting: 53.1  . Smokeless tobacco:  Never Used  . Tobacco comment: reports smoked socially  Substance Use Topics  . Alcohol use: Yes    Alcohol/week: 21.0 oz    Types: 35 Shots of liquor per week    Comment: 2 mixed drinks daily, some days  . Drug use: No   Current Outpatient Medications  Medication Sig Dispense Refill  . albuterol (ACCUNEB) 0.63 MG/3ML nebulizer solution Inhale into the lungs.    Marland Kitchen albuterol (PROVENTIL HFA;VENTOLIN HFA) 108 (90 BASE) MCG/ACT inhaler Inhale 1-2 puffs into the lungs every 6 (six) hours as needed for shortness of breath. 1 Inhaler 11  . amiodarone (PACERONE) 200 MG tablet TAKE ONE-HALF TABLET DAILY 45 tablet 3  . Empagliflozin-metFORMIN HCl (SYNJARDY) 05-998 MG TABS Take 1 tablet by mouth 2 (two) times daily. (Patient taking  differently: Take 1 tablet by mouth 2 (two) times daily as needed. ) 161 tablet 1  . folic acid (FOLVITE) 1 MG tablet TAKE ONE TABLET EVERY DAY 90 tablet 1  . levalbuterol (XOPENEX) 1.25 MG/3ML nebulizer solution Inhale into the lungs as needed.     Marland Kitchen levothyroxine (SYNTHROID, LEVOTHROID) 125 MCG tablet TAKE ONE TABLET EACH DAY 90 tablet 1  . metoprolol succinate (TOPROL-XL) 50 MG 24 hr tablet Take one-half tablet by mouth every evening 15 tablet 5  . mometasone (NASONEX) 50 MCG/ACT nasal spray 1-2 puffs each nostril once or twice daily if needed (Patient taking differently: as needed. 1-2 puffs each nostril once or twice daily if needed) 17 g 0  . NONFORMULARY OR COMPOUNDED ITEM Allergy Vaccine 1:10 Given at [facility]    . pantoprazole (PROTONIX) 40 MG tablet TAKE ONE TABLET BY MOUTH ONCE DAILY 90 tablet 0  . rosuvastatin (CRESTOR) 5 MG tablet Take 1 tablet (5 mg total) by mouth daily at 6 PM. 90 tablet 1  . sertraline (ZOLOFT) 100 MG tablet Take 1 tablet (100 mg total) by mouth daily. 90 tablet 3  . spironolactone (ALDACTONE) 25 MG tablet TAKE ONE TABLET BY MOUTH ONCE DAILY 30 tablet 8  . thiamine (VITAMIN B-1) 100 MG tablet Take 1 tablet (100 mg total) by mouth  daily. 90 tablet 1  . umeclidinium-vilanterol (ANORO ELLIPTA) 62.5-25 MCG/INH AEPB Inhale 1 puff into the lungs daily. 90 each 3  . warfarin (COUMADIN) 2.5 MG tablet TAKE AS DIRECTED BY COUMADIN CLINIC 35 tablet 1   No current facility-administered medications for this visit.    Allergies  Allergen Reactions  . Altace [Ramipril] Cough  . Hydrocodone Other (See Comments)    Severe panic attacks  . Oxycodone Other (See Comments)    Severe panic attacks     Review of Systems: All systems reviewed and negative except where noted in HPI.    Ct Head Wo Contrast  Result Date: 04/21/2017 CLINICAL DATA:  Fall at physical therapy today. Hit left side of head. Initial encounter. EXAM: CT HEAD WITHOUT CONTRAST TECHNIQUE: Contiguous axial images were obtained from the base of the skull through the vertex without intravenous contrast. COMPARISON:  06/03/2016 FINDINGS: Brain: There is no evidence of acute infarct, intracranial hemorrhage, mass, midline shift, or extra-axial fluid collection. 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 Bilateral periventricular white matter hypodensities are similar to the prior study and nonspecific but compatible with mild chronic small vessel ischemic disease. Mild cerebral atrophy is unchanged. Vascular: Calcified atherosclerosis at the skull base. No hyperdense vessel. Skull: No fracture or suspicious osseous lesion. Sinuses/Orbits: New, near complete opacification of the right maxillary sinus. Mild left greater than right ethmoid air cell opacification. Clear mastoid air cells. Unremarkable orbits. Other: None. IMPRESSION: 1. No evidence of acute intracranial abnormality. 2. Chronic ischemia as above. 3. Right maxillary sinusitis. Electronically Signed   By: Logan Bores M.D.   On: 04/21/2017 17:59    Physical Exam: BP 112/60   Pulse 64   Ht 6' (1.829 m)   Wt 200 lb (90.7 kg)   BMI 27.12 kg/m  Constitutional:  Pleasant,well-developed, male in no acute distress. HEENT: Normocephalic and atraumatic. Conjunctivae are normal.  Neck supple.  Cardiovascular: Normal rate, regular rhythm.  Pulmonary/chest: Effort normal and breath sounds normal. No wheezing, rales or rhonchi. Abdominal: Soft, nondistended, nontender.  There are no masses palpable.  Extremities: no edema Lymphadenopathy: No cervical adenopathy noted.  Neurological: Alert and oriented to person place and time. Skin: Skin is warm and dry. No rashes noted. Psychiatric: Normal mood and affect. Behavior is normal.   ASSESSMENT AND PLAN: 82 year old male here for follow-up regarding issues as outlined:  Alcoholic liver disease / elevated liver enzymes - his serologic workup historically has been negative. Ultrasound shows diffuse hepatic steatosis without cirrhosis. He has previously had significant alcohol use daily, I suspect this is very likely the etiology, while drug-related liver injury from medication such as amiodarone seems less likely given chronicity of its use and AST predominant elevation. I again have asked him to abstain completely from alcohol, he agreed to work on this. Given his AST rose to 200s on the last lab, will check today to ensure this hasn't worsened. We had a discussion about his ongoing alcohol use, his ongoing inflammation of his liver, and long-term risks for cirrhosis. He agreed, denies any symptoms of withdrawal the past.   Altered bowel habits - better using Citrucel daily at this time. He will continue this for now.   Runnells Cellar, MD [facility]

## 2017-05-01 NOTE — Therapy (Signed)
[facility] [address] [place], [place], [address] Phone: [phone]   Fax:  [phone]  Physical Therapy Treatment  Patient Details  Name: Corey [name] MRN: [id number] Date of Birth: 12/28/1935 Referring Provider: Scarlette Calico, MD   Encounter Date: 05/01/2017  PT End of Session - 05/01/17 1021    Visit Number  8    Number of Visits  17    Date for PT Re-Evaluation  05/22/17    Authorization Type  Medicare    PT Start Time  1018    PT Stop Time  1100    PT Time Calculation (min)  42 min    Equipment Utilized During Treatment  Gait belt    Activity Tolerance  Patient tolerated treatment well    Behavior During Therapy  [facility] for tasks assessed/performed       Past Medical History:  Diagnosis Date  . Alcohol abuse, episodic 05/19/2008  . ALLERGIC RHINITIS 04/15/2009  . Allergy    YEAR ROUND,TAKE SHOTS  . Alzheimer's dementia    beginning with sx  . Anxiety   . Arthritis   . ASTHMA 11/16/2006   sinusitis-    hx ?yeast patch/white patch on vocal cord as per [facility] 02/25/11-   . Atrial fibrillation ([place]) 11/16/2006   remote CHF related to atrial fib with rapid ventricular response over 25 yrs per office note,  . Cancer [facility]) 2002   prostate cancer  . Chronic systolic CHF (congestive heart failure) (HCC)    echo (10/02/12): EF 30-16%, grade 1 diastolic dysfunction, trivial AI, mild MR, mild RVE  . COLONIC POLYPS, ADENOMATOUS, HX OF 02/14/2008  . CORONARY ARTERY DISEASE 11/16/2006   LHC (07/2011): LAD with luminal irregularities, proximal circumflex 50%, EF 25%  . Depression   . DIVERTICULOSIS, COLON 02/14/2008  . ESOPHAGEAL STRICTURE 01/28/2008  . GERD 02/14/2008  . Headache(784.0)   . HYPERGLYCEMIA 11/16/2006  . HYPERLIPIDEMIA 02/14/2008  . HYPERTENSION 11/16/2006  . ICD (implantable cardiac defibrillator) in place   . INSOMNIA-SLEEP DISORDER-UNSPEC 02/11/2009  . Lesion of vocal cord    bx begine  . Myocardial  infarction ([place]) 1993  . NICM (nonischemic cardiomyopathy) ([place])   . OBSTRUCTIVE SLEEP APNEA 11/10/2008  . 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 Pacemaker    St. Jude  . Pneumonia    hx of  . PROSTATE CANCER, HX OF 02/25/2000  . SLEEP APNEA 10/03/2009   LOV Dr [name] 12/12 in EPIC    Moderate per patient- settings ?6/last sleep study years ago  . Sleep apnea   . Stroke ([place])    Tia  . Symptomatic bradycardia, secondary to sinus node dysfunction 07/08/2011   s/p [facility] [employer] PPM by Dr Sallyanne Kuster, upgraded to [place] ICD 12/13 by Dr Rayann Heman (SJM)  . TRANSIENT ISCHEMIC ATTACKS, HX OF 11/16/2006  . Type 2 diabetes mellitus with complication, without long-term current use of insulin ([place]) 11/19/2016    Past Surgical History:  Procedure Laterality Date  . BACK SURGERY    . BI-VENTRICULAR IMPLANTABLE CARDIOVERTER DEFIBRILLATOR UPGRADE N/A 01/16/2012   Procedure: BI-VENTRICULAR IMPLANTABLE CARDIOVERTER DEFIBRILLATOR UPGRADE;  Surgeon: Thompson Grayer, MD;  Location: [facility] CATH LAB;  Service: Cardiovascular;  Laterality: N/A;  . CARDIAC CATHETERIZATION     several  . CARDIAC DEFIBRILLATOR PLACEMENT  01/16/12   Upgrade to a biventricular SJM ICD by DR Allred  . COLONOSCOPY    . ESOPHAGOGASTRODUODENOSCOPY     with dilitation  . INSERT / REPLACE / REMOVE PACEMAKER    .  KNEE ARTHROSCOPY     2 surgeries right and 1 left  . LEFT AND RIGHT HEART CATHETERIZATION WITH CORONARY ANGIOGRAM N/A 08/18/2011   Procedure: LEFT AND RIGHT HEART CATHETERIZATION WITH CORONARY ANGIOGRAM;  Surgeon: [name], MD;  Location: [place] CATH LAB;  Service: Cardiovascular;  Laterality: N/A;  . LUMBAR LAMINECTOMY/DECOMPRESSION MICRODISCECTOMY  03/02/2011   Procedure: LUMBAR LAMINECTOMY/DECOMPRESSION MICRODISCECTOMY;  Surgeon: [name], MD;  [facility];  Service: Orthopedics;  Laterality: N/A;  Decompression Lumbar 4 - Lumbar(X-Ray)  . PACEMAKER INSERTION  06/2011   [employer] PPM implanted by Dr Sallyanne Kuster  .  PERMANENT PACEMAKER INSERTION Left 07/08/2011   Procedure: PERMANENT PACEMAKER INSERTION;  Surgeon: [name], MD;  Location: [place] CATH LAB;  Service: Cardiovascular;  Laterality: Left;  . PROSTATE SURGERY  2/02   prostate cancer  . TONSILECTOMY, ADENOIDECTOMY, BILATERAL MYRINGOTOMY AND TUBES    . TONSILLECTOMY  as child  . TOTAL KNEE ARTHROPLASTY Left 06/04/2012   Procedure: TOTAL KNEE ARTHROPLASTY;  Surgeon: [name], MD;  [facility];  Service: Orthopedics;  Laterality: Left;  . UVULOPALATOPHARYNGOPLASTY  2013  . vocal cord lesion bx      There were no vitals filed for this visit.  Subjective Assessment - 05/01/17 1020    Subjective  No new complaints. No new falls or pain to report.     Patient is accompained by:  Family member    Pertinent History  Pt goes by "[employer] hold activities;Walking    Patient Stated Goals  "I want to be able to walk a straight line and not fall down.  I want to stand up straight."     Currently in Pain?  No/denies    Pain Score  0-No pain           [facility] Adult PT Treatment/Exercise - 05/01/17 1051      Neuro Re-ed    Neuro Re-ed Details   for balance: forward gait along ~50 foot hallway with head turns left<>fwd<>right, then up<>fwd<>down, then diagonals both ways. 1 lap each with min guard assist. decreased gait speed and minor veering noted with head movements.       Knee/Hip Exercises: Aerobic   Other Aerobic  Scifit UE/LE's at level 3.5 x 8 minutes with goal rpm >/= 50 for strengthening and activity tolerance.           Balance Exercises - 05/01/17 1021      Balance Exercises: Standing   Standing Eyes Closed  Narrow base of support (BOS);Wide (BOA);Foam/compliant surface;Head turns;Other reps (comment);20 secs;30 secs;Limitations    Rockerboard  Anterior/posterior;Lateral;Head turns;EO;EC;30 seconds;10 reps    Balance Beam  on blue balance beam- side stepping left<>right x 3 laps each way with no UE  support to light touch on bars. cues on posture and step placement; tandem gait fwd/bwd with use of mirror to promote more upright posture, 3 laps each way with min guard assist to min assist for balance. cues on step placement for more heel<>toe stepping pattern.                     Balance Exercises: Standing   Standing Eyes Closed Limitations  in the corner on the open dense blue foam with chair in front for safety: wide base of support EC no head movements, progressing to EC head movements up/down, left/right and diagonals both ways with up to min assist for balance; narrow base of support with EC  no head movement with min to mod assist for balance, occasional touch to wall/chair.     Rebounder Limitations  in the corner with chair in front for safety (performed each task both ways on balance boad): rocking board with EO, progressing to EC with emphasis on tall posture and weight shifting. Holding board steady: EC head movements left/right, up/down and diagonals both ways. min to mod assist for balance with occasional touch to walls/chair. cues on posture and weight shifting to assist with balance recovery.                         PT Short Term Goals - 04/24/17 1154      PT SHORT TERM GOAL #1   Title  Pt will initiate HEP in order to indicate improved functional mobility and decreased fall risk.  (Target Date: 04/22/17)    Baseline  04/24/17: going well when he does them, inconsistent with compliance due to busy schedule. updated HEP today.     Status  Achieved      PT SHORT TERM GOAL #2   Title  Will assess gait speed and set appropriate LTG.     Baseline  3.36 ft/sec without AD on 03/30/17    Status  Achieved      PT SHORT TERM GOAL #3   Title  Will assess BERG and improve score 4 points from baseline in order to indicate decreased fall risk.      Baseline  04/24/17: 54/56 (45/56 baseline)    Time  --    Period  --    Status  Achieved      PT SHORT TERM GOAL #4   Title  Will assess  varying AD as appropriate to decrease fall risk.      Baseline  04/24/17: has been assessed with straight cane, howver pt refuses to use AD at this time.    Time  --    Status  Achieved      PT SHORT TERM GOAL #5   Title  Pt will perform 5TSS <20 secs without UE support in order to indicate improved functional strength.      Baseline  04/24/17: 15.41 sec's no UE support    Time  --    Period  --    Status  Achieved        PT Long Term Goals - 04/25/17 1119      PT LONG TERM GOAL #1   Title  Pt will be independent with HEP (with assist from wife as needed) in order to indicate improved functional mobility and decreased fall risk.  (Target Date: 05/22/17)    Time  8    Period  Weeks    Status  On-going      PT LONG TERM GOAL #2   Title  Pt will improve BERG balance score by 8 points from baseline in order to indicate decreased fall risk.      Baseline  04/24/17: met today with 54/56 score (45/56 baseline)    Status  Achieved      PT LONG TERM GOAL #3   Title  Pt will perform 5TSS <17 secs without UE support in order to indicate improved functional strength.      Baseline  04/24/17: met today with score of 15.41 sec's    Status  Achieved      PT LONG TERM GOAL #4   Title  Pt will ambulate up to 1000' over unlevel  paved outdoor surfaces at mod I level with LRAD in order to indicate safe community negotiation.      Time  8    Period  Weeks    Status  On-going      PT LONG TERM GOAL #5   Title  Pt will verbalize initiation of community fitness program in order to maintain gains made in therapy.      Time  8    Period  Weeks    Status  On-going            Plan - 05/01/17 1021    Clinical Impression Statement  Today's skilled session continued to address strengthening, activity tolerance and work on balance reactions. Pt continues to be challenged by compliant surfaces and/or vision removed with mostly posterior balance losses. Pt is progressing toward goals and should benefit  from continued PT.    Rehab Potential  Fair    Clinical Impairments Affecting Rehab Potential  ETOH abuse and underlying depression     PT Frequency  2x / week    PT Duration  8 weeks    PT Treatment/Interventions  ADLs/Self Care Home Management;DME Instruction;Gait training;Stair training;Functional mobility training;Therapeutic activities;Therapeutic exercise;Balance training;Neuromuscular re-education;Patient/family education;Orthotic Fit/Training;Vestibular;Passive range of motion    PT Next Visit Plan  continue with high level balance: uneven/compliant surfaces, single leg stance and vision removed.    Consulted and Agree with Plan of Care  Patient       Patient will benefit from skilled therapeutic intervention in order to improve the following deficits and impairments:  Abnormal gait, Decreased activity tolerance, Decreased balance, Decreased cognition, Decreased endurance, Decreased knowledge of use of DME, Decreased mobility, Decreased strength, Difficulty walking, Impaired perceived functional ability, Impaired flexibility, Postural dysfunction  Visit Diagnosis: Unsteadiness on feet  Muscle weakness (generalized)  Other abnormalities of gait and mobility  Repeated falls     Problem List Patient Active Problem List   Diagnosis Date Noted  . Pneumonia of right lower lobe due to infectious organism ([place]) 03/25/2017  . Type 2 diabetes mellitus with complication, without long-term current use of insulin ([place]) 11/19/2016  . Chronic right-sided thoracic back pain 08/15/2016  . DDD (degenerative disc disease), thoracic 06/08/2016  . Depression with anxiety 05/31/2016  . 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 Bronchiectasis without acute exacerbation ([place]) 12/31/2015  . Chronic alcoholic hepatitis 57/32/2025  . Obstructive chronic bronchitis without exacerbation ([place]) 11/12/2015  . Spinal stenosis in cervical region 10/02/2015  . Gallbladder sludge 03/02/2015  . Head  injury 01/28/2014  . Other iron deficiency anemias 11/13/2013  . B12 deficiency anemia 11/13/2013  . Encounter for therapeutic drug monitoring 02/25/2013  . Long term (current) use of anticoagulants 05/12/2012  . [employer] 11/01/2011  . Chronic systolic heart failure ([place]) 08/28/2011  . Vocal cord nodule 05/19/2011  . Lumbar spinal stenosis 03/03/2011  . Seasonal and perennial allergic rhinitis 04/15/2009  . Insomnia 02/11/2009  . Obstructive sleep apnea 11/10/2008  . Hyperlipidemia with target LDL less than 100 02/14/2008  . GERD 02/14/2008  . ESOPHAGEAL STRICTURE 01/28/2008  . Essential hypertension 11/16/2006  . Coronary atherosclerosis 11/16/2006  . Atrial fibrillation ([place]) 11/16/2006    Willow Ora, PTA, [facility] [address] [place], [place] [address] [phone] 05/01/17, 3:49 PM   Name: Corey [name] MRN: [id number] Date of Birth: Dec 13, 1935

## 2017-05-01 NOTE — Patient Instructions (Signed)
If you are age 80 or older, your body mass index should be between 23-30. Your Body mass index is 27.12 kg/m. If this is out of the aforementioned range listed, please consider follow up with your Primary Care Provider.  If you are age 67 or younger, your body mass index should be between 19-25. Your Body mass index is 27.12 kg/m. If this is out of the aformentioned range listed, please consider follow up with your Primary Care Provider.   Please go to the lab in the basement of our building to have lab work done as you leave today.  Please abstain from drinking alcohol.  Thank you for entrusting me with your care and for choosing [facility], Dr. [place] Cellar

## 2017-05-02 ENCOUNTER — Other Ambulatory Visit: Payer: Self-pay | Admitting: Internal Medicine

## 2017-05-03 ENCOUNTER — Encounter: Payer: Self-pay | Admitting: Physical Therapy

## 2017-05-03 ENCOUNTER — Ambulatory Visit: Payer: Medicare Other | Admitting: Physical Therapy

## 2017-05-03 DIAGNOSIS — M6281 Muscle weakness (generalized): Secondary | ICD-10-CM

## 2017-05-03 DIAGNOSIS — R296 Repeated falls: Secondary | ICD-10-CM

## 2017-05-03 DIAGNOSIS — R293 Abnormal posture: Secondary | ICD-10-CM | POA: Diagnosis not present

## 2017-05-03 DIAGNOSIS — R2689 Other abnormalities of gait and mobility: Secondary | ICD-10-CM

## 2017-05-03 DIAGNOSIS — R2681 Unsteadiness on feet: Secondary | ICD-10-CM

## 2017-05-03 NOTE — Therapy (Signed)
[facility] [address] [place], [place], [address] Phone: [phone]   Fax:  [phone]  Physical Therapy Treatment  Patient Details  Name: Corey [name] MRN: [id number] Date of Birth: 1935-09-04 Referring Provider: Scarlette Calico, MD   Encounter Date: 05/03/2017  PT End of Session - 05/03/17 1020    Visit Number  9    Number of Visits  17    Date for PT Re-Evaluation  05/22/17    Authorization Type  Medicare    PT Start Time  1018    PT Stop Time  1056    PT Time Calculation (min)  38 min    Equipment Utilized During Treatment  Gait belt    Activity Tolerance  Patient tolerated treatment well    Behavior During Therapy  WFL for tasks assessed/performed       Past Medical History:  Diagnosis Date  . Alcohol abuse, episodic 05/19/2008  . ALLERGIC RHINITIS 04/15/2009  . Allergy    YEAR ROUND,TAKE SHOTS  . Alzheimer's dementia    beginning with sx  . Anxiety   . Arthritis   . ASTHMA 11/16/2006   sinusitis-    hx ?yeast patch/white patch on vocal cord as per [facility] 02/25/11-   . Atrial fibrillation ([place]) 11/16/2006   remote CHF related to atrial fib with rapid ventricular response over 25 yrs per office note,  . Cancer [facility]) 2002   prostate cancer  . Chronic systolic CHF (congestive heart failure) (HCC)    echo (10/02/12): EF 95-28%, grade 1 diastolic dysfunction, trivial AI, mild MR, mild RVE  . COLONIC POLYPS, ADENOMATOUS, HX OF 02/14/2008  . CORONARY ARTERY DISEASE 11/16/2006   LHC (07/2011): LAD with luminal irregularities, proximal circumflex 50%, EF 25%  . Depression   . DIVERTICULOSIS, COLON 02/14/2008  . ESOPHAGEAL STRICTURE 01/28/2008  . GERD 02/14/2008  . Headache(784.0)   . HYPERGLYCEMIA 11/16/2006  . HYPERLIPIDEMIA 02/14/2008  . HYPERTENSION 11/16/2006  . ICD (implantable cardiac defibrillator) in place   . INSOMNIA-SLEEP DISORDER-UNSPEC 02/11/2009  . Lesion of vocal cord    bx begine  . Myocardial  infarction ([place]) 1993  . NICM (nonischemic cardiomyopathy) ([place])   . OBSTRUCTIVE SLEEP APNEA 11/10/2008  . 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 Pacemaker    St. Jude  . Pneumonia    hx of  . PROSTATE CANCER, HX OF 02/25/2000  . SLEEP APNEA 10/03/2009   LOV Dr [name] 12/12 in EPIC    Moderate per patient- settings ?6/last sleep study years ago  . Sleep apnea   . Stroke ([place])    Tia  . Symptomatic bradycardia, secondary to sinus node dysfunction 07/08/2011   s/p [facility] [employer] PPM by Dr Sallyanne Kuster, upgraded to [place] ICD 12/13 by Dr Rayann Heman (SJM)  . TRANSIENT ISCHEMIC ATTACKS, HX OF 11/16/2006  . Type 2 diabetes mellitus with complication, without long-term current use of insulin ([place]) 11/19/2016    Past Surgical History:  Procedure Laterality Date  . BACK SURGERY    . BI-VENTRICULAR IMPLANTABLE CARDIOVERTER DEFIBRILLATOR UPGRADE N/A 01/16/2012   Procedure: BI-VENTRICULAR IMPLANTABLE CARDIOVERTER DEFIBRILLATOR UPGRADE;  Surgeon: Thompson Grayer, MD;  Location: [facility] CATH LAB;  Service: Cardiovascular;  Laterality: N/A;  . CARDIAC CATHETERIZATION     several  . CARDIAC DEFIBRILLATOR PLACEMENT  01/16/12   Upgrade to a biventricular SJM ICD by DR Allred  . COLONOSCOPY    . ESOPHAGOGASTRODUODENOSCOPY     with dilitation  . INSERT / REPLACE / REMOVE PACEMAKER    .  KNEE ARTHROSCOPY     2 surgeries right and 1 left  . LEFT AND RIGHT HEART CATHETERIZATION WITH CORONARY ANGIOGRAM N/A 08/18/2011   Procedure: LEFT AND RIGHT HEART CATHETERIZATION WITH CORONARY ANGIOGRAM;  Surgeon: [name], MD;  Location: [facility] CATH LAB;  Service: Cardiovascular;  Laterality: N/A;  . LUMBAR LAMINECTOMY/DECOMPRESSION MICRODISCECTOMY  03/02/2011   Procedure: LUMBAR LAMINECTOMY/DECOMPRESSION MICRODISCECTOMY;  Surgeon: [name], MD;  Location: [facility] ORS;  Service: Orthopedics;  Laterality: N/A;  Decompression Lumbar 4 - Lumbar(X-Ray)  . PACEMAKER INSERTION  06/2011   [employer] PPM implanted by Dr Sallyanne Kuster  .  PERMANENT PACEMAKER INSERTION Left 07/08/2011   Procedure: PERMANENT PACEMAKER INSERTION;  Surgeon: [name], MD;  Location: [facility] CATH LAB;  Service: Cardiovascular;  Laterality: Left;  . PROSTATE SURGERY  2/02   prostate cancer  . TONSILECTOMY, ADENOIDECTOMY, BILATERAL MYRINGOTOMY AND TUBES    . TONSILLECTOMY  as child  . TOTAL KNEE ARTHROPLASTY Left 06/04/2012   Procedure: TOTAL KNEE ARTHROPLASTY;  Surgeon: [name], MD;  Location: [facility] ORS;  Service: Orthopedics;  Laterality: Left;  . UVULOPALATOPHARYNGOPLASTY  2013  . vocal cord lesion bx      There were no vitals filed for this visit.  Subjective Assessment - 05/03/17 1020    Subjective  No new complaints. No new falls or pain to report.     Pertinent History  Pt goes by "[employer] hold activities;Walking    Patient Stated Goals  "I want to be able to walk a straight line and not fall down.  I want to stand up straight."     Currently in Pain?  No/denies    Pain Score  0-No pain            [facility] Adult PT Treatment/Exercise - 05/03/17 1021      Transfers   Transfers  Sit to Stand;Stand to Sit    Sit to Stand  6: Modified independent (Device/Increase time);Without upper extremity assist;From chair/3-in-1    Stand to Sit  6: Modified independent (Device/Increase time);Without upper extremity assist;To chair/3-in-1      Ambulation/Gait   Ambulation/Gait  Yes    Ambulation/Gait Assistance  6: Modified independent (Device/Increase time)    Ambulation/Gait Assistance Details  no balance issues noted, no toe/foot scuffing noted.     Ambulation Distance (Feet)  1000 Feet    Assistive device  None    Gait Pattern  Step-through pattern;Decreased stride length    Ambulation Surface  Level;Unlevel;Indoor;Outdoor;Paved;Gravel;Grass      High Level Balance   High Level Balance Activities  Marching forwards;Marching backwards;Tandem walking    High Level Balance Comments  on red mats no UE support: 3 laps  each with min guard assist, cues on posture and ex form/technique          Balance Exercises - 05/03/17 1050      Balance Exercises: Standing   Rockerboard  Anterior/posterior;Lateral;Head turns;EO;EC;30 seconds;10 reps    Sit to Stand Time  sit<>stands with feet over black half foam roll: x3 with feet on flat side, x2 with feet over domed side. min to mod assist with cues for technique. increase in knee discomfort so stopped after 5 reps. pain decreased to none with stopping activity      Balance Exercises: Standing   Rebounder Limitations  on the large balance board in the parallel bars: performed both ways on board with no UE support, occasional touch to bars- rocking board with emphasis  on tall posture with EO, progressing to EC, then holding board steady: EC no head movements, progressing to EC head movements left<>right, up<>down. min guard assist with occasional touch to bars for balance.                             PT Short Term Goals - 04/24/17 1154      PT SHORT TERM GOAL #1   Title  Pt will initiate HEP in order to indicate improved functional mobility and decreased fall risk.  (Target Date: 04/22/17)    Baseline  04/24/17: going well when he does them, inconsistent with compliance due to busy schedule. updated HEP today.     Status  Achieved      PT SHORT TERM GOAL #2   Title  Will assess gait speed and set appropriate LTG.     Baseline  3.36 ft/sec without AD on 03/30/17    Status  Achieved      PT SHORT TERM GOAL #3   Title  Will assess BERG and improve score 4 points from baseline in order to indicate decreased fall risk.      Baseline  04/24/17: 54/56 (45/56 baseline)    Time  --    Period  --    Status  Achieved      PT SHORT TERM GOAL #4   Title  Will assess varying AD as appropriate to decrease fall risk.      Baseline  04/24/17: has been assessed with straight cane, howver pt refuses to use AD at this time.    Time  --    Status  Achieved      PT SHORT TERM  GOAL #5   Title  Pt will perform 5TSS <20 secs without UE support in order to indicate improved functional strength.      Baseline  04/24/17: 15.41 sec's no UE support    Time  --    Period  --    Status  Achieved        PT Long Term Goals - 05/03/17 1501      PT LONG TERM GOAL #1   Title  Pt will be independent with HEP (with assist from wife as needed) in order to indicate improved functional mobility and decreased fall risk.  (Target Date: 05/22/17)    Time  8    Period  Weeks    Status  On-going      PT LONG TERM GOAL #2   Title  Pt will improve BERG balance score by 8 points from baseline in order to indicate decreased fall risk.      Baseline  04/24/17: met today with 54/56 score (45/56 baseline)    Status  Achieved      PT LONG TERM GOAL #3   Title  Pt will perform 5TSS <17 secs without UE support in order to indicate improved functional strength.      Baseline  04/24/17: met today with score of 15.41 sec's    Status  Achieved      PT LONG TERM GOAL #4   Title  Pt will ambulate up to 1000' over unlevel paved outdoor surfaces at mod I level with LRAD in order to indicate safe community negotiation.      Baseline  05/03/17: met today    Status  Achieved      PT LONG TERM GOAL #5   Title  Pt will verbalize initiation  of community fitness program in order to maintain gains made in therapy.      Time  8    Period  Weeks    Status  On-going          Plan - 05/03/17 1020    Clinical Impression Statement  Today's skilled session continued to address gait on various surfaces (with LTG met) and high level balance reactions without any issues reported. Pt is progressing well towards goals and should benefit from continued PT to progress toward unmet goals.     Rehab Potential  Fair    Clinical Impairments Affecting Rehab Potential  ETOH abuse and underlying depression     PT Frequency  2x / week    PT Duration  8 weeks    PT Treatment/Interventions  ADLs/Self Care Home  Management;DME Instruction;Gait training;Stair training;Functional mobility training;Therapeutic activities;Therapeutic exercise;Balance training;Neuromuscular re-education;Patient/family education;Orthotic Fit/Training;Vestibular;Passive range of motion    PT Next Visit Plan  continue to address balance reactions, ? if pt will be ready for discharge with goals met in next 1-2 visits as he is progressing well these past few sessions    Consulted and Agree with Plan of Care  Patient       Patient will benefit from skilled therapeutic intervention in order to improve the following deficits and impairments:  Abnormal gait, Decreased activity tolerance, Decreased balance, Decreased cognition, Decreased endurance, Decreased knowledge of use of DME, Decreased mobility, Decreased strength, Difficulty walking, Impaired perceived functional ability, Impaired flexibility, Postural dysfunction  Visit Diagnosis: Unsteadiness on feet  Muscle weakness (generalized)  Other abnormalities of gait and mobility  Repeated falls     Problem List Patient Active Problem List   Diagnosis Date Noted  . Pneumonia of right lower lobe due to infectious organism ([place]) 03/25/2017  . Type 2 diabetes mellitus with complication, without long-term current use of insulin ([place]) 11/19/2016  . Chronic right-sided thoracic back pain 08/15/2016  . DDD (degenerative disc disease), thoracic 06/08/2016  . Depression with anxiety 05/31/2016  . 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 Bronchiectasis without acute exacerbation ([place]) 12/31/2015  . Chronic alcoholic hepatitis 88/91/6945  . Obstructive chronic bronchitis without exacerbation ([place]) 11/12/2015  . Spinal stenosis in cervical region 10/02/2015  . Gallbladder sludge 03/02/2015  . Head injury 01/28/2014  . Other iron deficiency anemias 11/13/2013  . B12 deficiency anemia 11/13/2013  . Encounter for therapeutic drug monitoring 02/25/2013  . Long term (current)  use of anticoagulants 05/12/2012  . [employer] 11/01/2011  . Chronic systolic heart failure ([place]) 08/28/2011  . Vocal cord nodule 05/19/2011  . Lumbar spinal stenosis 03/03/2011  . Seasonal and perennial allergic rhinitis 04/15/2009  . Insomnia 02/11/2009  . Obstructive sleep apnea 11/10/2008  . Hyperlipidemia with target LDL less than 100 02/14/2008  . GERD 02/14/2008  . ESOPHAGEAL STRICTURE 01/28/2008  . Essential hypertension 11/16/2006  . Coronary atherosclerosis 11/16/2006  . Atrial fibrillation ([place]) 11/16/2006    Willow Ora, PTA, [facility] [address] [place], [place] [address] [phone] 05/03/17, 3:02 PM   Name: Corey [name] MRN: [id number] Date of Birth: [birth year]

## 2017-05-04 ENCOUNTER — Encounter: Payer: Medicare Other | Admitting: Dietician

## 2017-05-04 DIAGNOSIS — Z713 Dietary counseling and surveillance: Secondary | ICD-10-CM | POA: Diagnosis not present

## 2017-05-04 DIAGNOSIS — F4323 Adjustment disorder with mixed anxiety and depressed mood: Secondary | ICD-10-CM | POA: Diagnosis not present

## 2017-05-04 DIAGNOSIS — E118 Type 2 diabetes mellitus with unspecified complications: Secondary | ICD-10-CM | POA: Diagnosis not present

## 2017-05-04 DIAGNOSIS — E119 Type 2 diabetes mellitus without complications: Secondary | ICD-10-CM

## 2017-05-04 NOTE — Progress Notes (Signed)
Patient was seen on 05/04/17 for the second of a series of three diabetes self-management courses at the [facility]. The following learning objectives were met by the patient during this class:   Describe the role of different macronutrients on glucose  Explain how carbohydrates affect blood glucose  State what foods contain the most carbohydrates  Demonstrate carbohydrate counting  Demonstrate how to read Nutrition Facts food label  Describe effects of various fats on heart health  Describe the importance of good nutrition for health and healthy eating strategies  Describe techniques for managing your shopping, cooking and meal planning  List strategies to follow meal plan when dining out  Describe the effects of alcohol on glucose and how to use it safely  Goals:  Follow Diabetes Meal Plan as instructed  Aim to spread carbs evenly throughout the day  Aim for 3 meals per day and snacks as needed Include lean protein foods to meals/snacks  Monitor glucose levels as instructed by your doctor   Follow-Up Plan:  Attend Core 3  Work towards following your personal food plan.

## 2017-05-06 ENCOUNTER — Other Ambulatory Visit: Payer: Self-pay | Admitting: Internal Medicine

## 2017-05-08 ENCOUNTER — Encounter: Payer: Self-pay | Admitting: Rehabilitation

## 2017-05-08 ENCOUNTER — Ambulatory Visit: Payer: Medicare Other | Admitting: Rehabilitation

## 2017-05-08 DIAGNOSIS — R2681 Unsteadiness on feet: Secondary | ICD-10-CM | POA: Diagnosis not present

## 2017-05-08 DIAGNOSIS — R296 Repeated falls: Secondary | ICD-10-CM | POA: Diagnosis not present

## 2017-05-08 DIAGNOSIS — R2689 Other abnormalities of gait and mobility: Secondary | ICD-10-CM

## 2017-05-08 DIAGNOSIS — M6281 Muscle weakness (generalized): Secondary | ICD-10-CM

## 2017-05-08 DIAGNOSIS — R293 Abnormal posture: Secondary | ICD-10-CM

## 2017-05-08 NOTE — Patient Instructions (Signed)
Perform these at your counter or a dining table to hold onto for stability:   "I love a Parade" Lift   At counter for balance as needed: high knee marching forward and then backward. 3 second pauses with each knee lift.  Repeat 3 laps each way. Do _1-2_ sessions per day. http://gt2.exer.us/345   Copyright  VHI. All rights reserved. .  Feet Heel-Toe "Tandem"   At counter: Arms at sides, walk a straight line forward bringing one foot directly in front of the other, and then a straight line backwards bringing one foot directly behind the other one.  Repeat for _3 laps each way. Do _1-2_ sessions per day.  Copyright  VHI. All rights reserved.    Perform these in a corner with a chair in front of you for safety:  Feet Together (Compliant Surface) Varied Arm Positions - Eyes Closed    Stand on compliant surface: pillow/s with feet together and arms at sides. Close eyes and visualize upright position. Hold _30_ seconds. Repeat _3_ times per session. Do _1-2_ sessions per day.  Copyright  VHI. All rights reserved.    Feet Together (Compliant Surface) Head Motion - Eyes Closed    Stand on compliant surface: ___pillow_____ with feet together. Close eyes and move head slowly, up and down x 10 reps, side to side x 10 reps, and diagonally (both directions so up left, down right x 10 reps and then up right down left x 10 reps).   Repeat _1___ times per session. Do _1-2___ sessions per day.  Copyright  VHI. All rights reserved.

## 2017-05-08 NOTE — Therapy (Signed)
[facility] [address] [place], [place], [address] Phone: [phone]   Fax:  [phone]  Physical Therapy Treatment and DC Summary   Patient Details  Name: Corey [name] MRN: [id number] Date of Birth: 14-May-1935 Referring Provider: Scarlette Calico, MD   Encounter Date: 05/08/2017  PT End of Session - 05/08/17 1244    Visit Number  10    Number of Visits  17    Date for PT Re-Evaluation  05/22/17    Authorization Type  Medicare    PT Start Time  1015 DC visit, did not need whole time    PT Stop Time  1050    PT Time Calculation (min)  35 min    Equipment Utilized During Treatment  Gait belt    Activity Tolerance  Patient tolerated treatment well    Behavior During Therapy  [facility] for tasks assessed/performed       Past Medical History:  Diagnosis Date  . Alcohol abuse, episodic 05/19/2008  . ALLERGIC RHINITIS 04/15/2009  . Allergy    YEAR ROUND,TAKE SHOTS  . Alzheimer's dementia    beginning with sx  . Anxiety   . Arthritis   . ASTHMA 11/16/2006   sinusitis-    hx ?yeast patch/white patch on vocal cord as per [facility] 02/25/11-   . Atrial fibrillation ([employer]) 11/16/2006   remote CHF related to atrial fib with rapid ventricular response over 25 yrs per office note,  . Cancer [facility]) 2002   prostate cancer  . Chronic systolic CHF (congestive heart failure) (HCC)    echo (10/02/12): EF 03-50%, grade 1 diastolic dysfunction, trivial AI, mild MR, mild RVE  . COLONIC POLYPS, ADENOMATOUS, HX OF 02/14/2008  . CORONARY ARTERY DISEASE 11/16/2006   LHC (07/2011): LAD with luminal irregularities, proximal circumflex 50%, EF 25%  . Depression   . DIVERTICULOSIS, COLON 02/14/2008  . ESOPHAGEAL STRICTURE 01/28/2008  . GERD 02/14/2008  . Headache(784.0)   . HYPERGLYCEMIA 11/16/2006  . HYPERLIPIDEMIA 02/14/2008  . HYPERTENSION 11/16/2006  . ICD (implantable cardiac defibrillator) in place   . INSOMNIA-SLEEP DISORDER-UNSPEC 02/11/2009   . Lesion of vocal cord    bx begine  . Myocardial infarction ([place]) 1993  . NICM (nonischemic cardiomyopathy) ([place])   . OBSTRUCTIVE SLEEP APNEA 11/10/2008  . 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 Pacemaker    St. Jude  . Pneumonia    hx of  . PROSTATE CANCER, HX OF 02/25/2000  . SLEEP APNEA 10/03/2009   LOV Dr [name] 12/12 in EPIC    Moderate per patient- settings ?6/last sleep study years ago  . Sleep apnea   . Stroke ([place])    Tia  . Symptomatic bradycardia, secondary to sinus node dysfunction 07/08/2011   s/p [facility] Scientific PPM by Dr Sallyanne Kuster, upgraded to Early ICD 12/13 by Dr Rayann Heman (SJM)  . TRANSIENT ISCHEMIC ATTACKS, HX OF 11/16/2006  . Type 2 diabetes mellitus with complication, without long-term current use of insulin ([place]) 11/19/2016    Past Surgical History:  Procedure Laterality Date  . BACK SURGERY    . BI-VENTRICULAR IMPLANTABLE CARDIOVERTER DEFIBRILLATOR UPGRADE N/A 01/16/2012   Procedure: BI-VENTRICULAR IMPLANTABLE CARDIOVERTER DEFIBRILLATOR UPGRADE;  Surgeon: Thompson Grayer, MD;  Location: [facility] CATH LAB;  Service: Cardiovascular;  Laterality: N/A;  . CARDIAC CATHETERIZATION     several  . CARDIAC DEFIBRILLATOR PLACEMENT  01/16/12   Upgrade to a biventricular SJM ICD by DR Allred  . COLONOSCOPY    . ESOPHAGOGASTRODUODENOSCOPY  with dilitation  . INSERT / REPLACE / REMOVE PACEMAKER    . KNEE ARTHROSCOPY     2 surgeries right and 1 left  . LEFT AND RIGHT HEART CATHETERIZATION WITH CORONARY ANGIOGRAM N/A 08/18/2011   Procedure: LEFT AND RIGHT HEART CATHETERIZATION WITH CORONARY ANGIOGRAM;  Surgeon: [name], MD;  Location: [facility] CATH LAB;  Service: Cardiovascular;  Laterality: N/A;  . LUMBAR LAMINECTOMY/DECOMPRESSION MICRODISCECTOMY  03/02/2011   Procedure: LUMBAR LAMINECTOMY/DECOMPRESSION MICRODISCECTOMY;  Surgeon: [name], MD;  Location: [facility] ORS;  Service: Orthopedics;  Laterality: N/A;  Decompression Lumbar 4 - Lumbar(X-Ray)  . PACEMAKER INSERTION  06/2011    [employer] PPM implanted by Dr Sallyanne Kuster  . PERMANENT PACEMAKER INSERTION Left 07/08/2011   Procedure: PERMANENT PACEMAKER INSERTION;  Surgeon: [name], MD;  Location: [facility] CATH LAB;  Service: Cardiovascular;  Laterality: Left;  . PROSTATE SURGERY  2/02   prostate cancer  . TONSILECTOMY, ADENOIDECTOMY, BILATERAL MYRINGOTOMY AND TUBES    . TONSILLECTOMY  as child  . TOTAL KNEE ARTHROPLASTY Left 06/04/2012   Procedure: TOTAL KNEE ARTHROPLASTY;  Surgeon: [name], MD;  Location: [facility] ORS;  Service: Orthopedics;  Laterality: Left;  . UVULOPALATOPHARYNGOPLASTY  2013  . vocal cord lesion bx      There were no vitals filed for this visit.  Subjective Assessment - 05/08/17 1019    Subjective  No new compliants, no falls.     Pertinent History  Pt goes by "[employer] hold activities;Walking    Patient Stated Goals  "I want to be able to walk a straight line and not fall down.  I want to stand up straight."     Currently in Pain?  No/denies         [facility] PT Assessment - 05/08/17 1026      Functional Gait  Assessment   Gait assessed   Yes    Gait Level Surface  Walks 20 ft in less than 7 sec but greater than 5.5 sec, uses assistive device, slower speed, mild gait deviations, or deviates 6-10 in outside of the 12 in walkway width.    Change in Gait Speed  Able to smoothly change walking speed without loss of balance or gait deviation. Deviate no more than 6 in outside of the 12 in walkway width.    Gait with Horizontal Head Turns  Performs head turns smoothly with no change in gait. Deviates no more than 6 in outside 12 in walkway width    Gait with Vertical Head Turns  Performs head turns with no change in gait. Deviates no more than 6 in outside 12 in walkway width.    Gait and Pivot Turn  Pivot turns safely within 3 sec and stops quickly with no loss of balance.    Step Over Obstacle  Is able to step over 2 stacked shoe boxes taped together (9 in total  height) without changing gait speed. No evidence of imbalance.    Gait with Narrow Base of Support  Ambulates 7-9 steps.    Gait with Eyes Closed  Walks 20 ft, uses assistive device, slower speed, mild gait deviations, deviates 6-10 in outside 12 in walkway width. Ambulates 20 ft in less than 9 sec but greater than 7 sec.    Ambulating Backwards  Walks 20 ft, uses assistive device, slower speed, mild gait deviations, deviates 6-10 in outside 12 in walkway width.    Steps  Alternating feet, no rail.  Total Score  26           NMR:  See pt instruction for details on HEP performed during session with reps.          [facility] Adult PT Treatment/Exercise - 05/08/17 0001      Self-Care   Self-Care  Other Self-Care Comments    Other Self-Care Comments   Discussed ending therapy following this visit as pt has made marked progress in all aspects of mobility.  Pt verbalized understanding and agreed.  Discussed and provided pt with info on [employer] center as he lives close and felt that price was reasonable.  Also educated that they have trainers that can help establish safety with equipement and give gym HEP.  Pt verbalized understanding.               PT Education - 05/08/17 1244    Education Details  D/C from therapy due to progress, community fitness options    Person(s) Educated  Patient    Methods  Explanation;Handout    Comprehension  Verbalized understanding       PT Short Term Goals - 04/24/17 1154      PT SHORT TERM GOAL #1   Title  Pt will initiate HEP in order to indicate improved functional mobility and decreased fall risk.  (Target Date: 04/22/17)    Baseline  04/24/17: going well when he does them, inconsistent with compliance due to busy schedule. updated HEP today.     Status  Achieved      PT SHORT TERM GOAL #2   Title  Will assess gait speed and set appropriate LTG.     Baseline  3.36 ft/sec without AD on 03/30/17    Status  Achieved      PT SHORT TERM GOAL  #3   Title  Will assess BERG and improve score 4 points from baseline in order to indicate decreased fall risk.      Baseline  04/24/17: 54/56 (45/56 baseline)    Time  --    Period  --    Status  Achieved      PT SHORT TERM GOAL #4   Title  Will assess varying AD as appropriate to decrease fall risk.      Baseline  04/24/17: has been assessed with straight cane, howver pt refuses to use AD at this time.    Time  --    Status  Achieved      PT SHORT TERM GOAL #5   Title  Pt will perform 5TSS <20 secs without UE support in order to indicate improved functional strength.      Baseline  04/24/17: 15.41 sec's no UE support    Time  --    Period  --    Status  Achieved        PT Long Term Goals - 05/08/17 1246      PT LONG TERM GOAL #1   Title  Pt will be independent with HEP (with assist from wife as needed) in order to indicate improved functional mobility and decreased fall risk.  (Target Date: 05/22/17)    Baseline  met with single modification on 05/08/17    Time  8    Period  Weeks    Status  Achieved      PT LONG TERM GOAL #2   Title  Pt will improve BERG balance score by 8 points from baseline in order to indicate decreased fall risk.  Baseline  04/24/17: met today with 54/56 score (45/56 baseline)    Status  Achieved      PT LONG TERM GOAL #3   Title  Pt will perform 5TSS <17 secs without UE support in order to indicate improved functional strength.      Baseline  04/24/17: met today with score of 15.41 sec's    Status  Achieved      PT LONG TERM GOAL #4   Title  Pt will ambulate up to 1000' over unlevel paved outdoor surfaces at mod I level with LRAD in order to indicate safe community negotiation.      Baseline  05/03/17: met today    Status  Achieved      PT LONG TERM GOAL #5   Title  Pt will verbalize initiation of community fitness program in order to maintain gains made in therapy.      Baseline  Has discussed with wife recently, has not actually joined anywhere yet     Time  8    Period  Weeks    Status  Partially Met            Plan - 05/08/17 1245    Clinical Impression Statement  Skilled session focused on assessment of remaining goals.  Even assessed FGA as he has topped out of BERG.  Note score of 26/30 which indicates low fall risk and has HEP to address remaining balance deficits.  He has not yet joined community fitness center, but he and wife have talked about it. He reports not "feeling ready yet."  Encouraged him to begin since we are ending therapy.  Provided with community resource for [employer].  Pt agreed and has met remaining goals.      Rehab Potential  Fair    Clinical Impairments Affecting Rehab Potential  ETOH abuse and underlying depression     PT Frequency  2x / week    PT Duration  8 weeks    PT Treatment/Interventions  ADLs/Self Care Home Management;DME Instruction;Gait training;Stair training;Functional mobility training;Therapeutic activities;Therapeutic exercise;Balance training;Neuromuscular re-education;Patient/family education;Orthotic Fit/Training;Vestibular;Passive range of motion    Consulted and Agree with Plan of Care  Patient       Patient will benefit from skilled therapeutic intervention in order to improve the following deficits and impairments:  Abnormal gait, Decreased activity tolerance, Decreased balance, Decreased cognition, Decreased endurance, Decreased knowledge of use of DME, Decreased mobility, Decreased strength, Difficulty walking, Impaired perceived functional ability, Impaired flexibility, Postural dysfunction  Visit Diagnosis: Unsteadiness on feet  Muscle weakness (generalized)  Other abnormalities of gait and mobility  Repeated falls  Abnormal posture    PHYSICAL THERAPY DISCHARGE SUMMARY  Visits from Start of Care: 10  Current functional level related to goals / functional outcomes: See LTGs above   Remaining deficits: Pt is still low fall risk, however has HEP to  address remaining deficits.  Overall pt demos marked improvement in all aspects of mobility from baseline.    Education / Equipment: HEP  Plan: Patient agrees to discharge.  Patient goals were partially met. Patient is being discharged due to meeting the stated rehab goals.  ?????       Problem List Patient Active Problem List   Diagnosis Date Noted  . Pneumonia of right lower lobe due to infectious organism ([place]) 03/25/2017  . Type 2 diabetes mellitus with complication, without long-term current use of insulin ([place]) 11/19/2016  . Chronic right-sided thoracic back pain 08/15/2016  . DDD (degenerative  disc disease), thoracic 06/08/2016  . Depression with anxiety 05/31/2016  . 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 Bronchiectasis without acute exacerbation ([place]) 12/31/2015  . Chronic alcoholic hepatitis 88/30/1415  . Obstructive chronic bronchitis without exacerbation ([place]) 11/12/2015  . Spinal stenosis in cervical region 10/02/2015  . Gallbladder sludge 03/02/2015  . Head injury 01/28/2014  . Other iron deficiency anemias 11/13/2013  . B12 deficiency anemia 11/13/2013  . Encounter for therapeutic drug monitoring 02/25/2013  . Long term (current) use of anticoagulants 05/12/2012  . [employer] 11/01/2011  . Chronic systolic heart failure ([place]) 08/28/2011  . Vocal cord nodule 05/19/2011  . Lumbar spinal stenosis 03/03/2011  . Seasonal and perennial allergic rhinitis 04/15/2009  . Insomnia 02/11/2009  . Obstructive sleep apnea 11/10/2008  . Hyperlipidemia with target LDL less than 100 02/14/2008  . GERD 02/14/2008  . ESOPHAGEAL STRICTURE 01/28/2008  . Essential hypertension 11/16/2006  . Coronary atherosclerosis 11/16/2006  . Atrial fibrillation ([place]) 11/16/2006    Cameron Sprang, PT, MPT [facility] [address] [place], [place], [address] Phone: [phone]   Fax:  [phone] 05/08/17, 12:50  PM  Name: Corey [name] MRN: [id number] Date of Birth: 17-Nov-1935

## 2017-05-09 ENCOUNTER — Ambulatory Visit (INDEPENDENT_AMBULATORY_CARE_PROVIDER_SITE_OTHER): Payer: Medicare Other | Admitting: Pharmacist

## 2017-05-09 DIAGNOSIS — Z7901 Long term (current) use of anticoagulants: Secondary | ICD-10-CM | POA: Diagnosis not present

## 2017-05-09 DIAGNOSIS — Z5181 Encounter for therapeutic drug level monitoring: Secondary | ICD-10-CM | POA: Diagnosis not present

## 2017-05-09 DIAGNOSIS — I4891 Unspecified atrial fibrillation: Secondary | ICD-10-CM

## 2017-05-09 LAB — POCT INR: INR: 1.5

## 2017-05-09 NOTE — Patient Instructions (Signed)
Description   Take 2 tablets today and 1.5 tablets tomorrow, then start taking 1 tablet every day except 2 tablets on Sundays. Recheck in 10 days.

## 2017-05-11 ENCOUNTER — Ambulatory Visit: Payer: Medicare Other | Admitting: Rehabilitation

## 2017-05-11 ENCOUNTER — Ambulatory Visit (INDEPENDENT_AMBULATORY_CARE_PROVIDER_SITE_OTHER): Payer: Medicare Other

## 2017-05-11 ENCOUNTER — Encounter: Payer: Medicare Other | Admitting: Dietician

## 2017-05-11 DIAGNOSIS — Z95810 Presence of automatic (implantable) cardiac defibrillator: Secondary | ICD-10-CM | POA: Diagnosis not present

## 2017-05-11 DIAGNOSIS — I5022 Chronic systolic (congestive) heart failure: Secondary | ICD-10-CM

## 2017-05-11 DIAGNOSIS — E119 Type 2 diabetes mellitus without complications: Secondary | ICD-10-CM

## 2017-05-11 DIAGNOSIS — H524 Presbyopia: Secondary | ICD-10-CM | POA: Diagnosis not present

## 2017-05-11 DIAGNOSIS — E118 Type 2 diabetes mellitus with unspecified complications: Secondary | ICD-10-CM | POA: Diagnosis not present

## 2017-05-11 DIAGNOSIS — F331 Major depressive disorder, recurrent, moderate: Secondary | ICD-10-CM | POA: Diagnosis not present

## 2017-05-11 DIAGNOSIS — Z713 Dietary counseling and surveillance: Secondary | ICD-10-CM | POA: Diagnosis not present

## 2017-05-11 DIAGNOSIS — F411 Generalized anxiety disorder: Secondary | ICD-10-CM | POA: Diagnosis not present

## 2017-05-11 LAB — HM DIABETES EYE EXAM

## 2017-05-11 NOTE — Progress Notes (Signed)
Patient was seen on 05/11/17 for the third of a series of three diabetes self-management courses at [facility].   Catalina Gravel the amount of activity recommended for healthy living . Describe activities suitable for individual needs . Identify ways to regularly incorporate activity into daily life . Identify barriers to activity and ways to over come these barriers  Identify diabetes medications being personally used and their primary action for lowering glucose and possible side effects . Describe role of stress on blood glucose and develop strategies to address psychosocial issues . Identify diabetes complications and ways to prevent them  Explain how to manage diabetes during illness . Evaluate success in meeting personal goal . Establish 2-3 goals that they will plan to diligently work on  Goals:   I will count my carb choices at most meals and snacks  I will be active 20 minutes or more 3 times a week  I will take my diabetes medications as scheduled  I will eat less unhealthy fats by eating less doritos  I will test my glucose at least 1 times a day, 2 days a week  I will look at patterns in my record book at least 5 days a month  Your patient has identified these potential barriers to change:  Stress  Your patient has identified their diabetes self-care support plan as  Family Support   Plan:  Attend Support Group as desired

## 2017-05-11 NOTE — Progress Notes (Signed)
EPIC Encounter for ICM Monitoring  Patient Name: SIPRIANO FENDLEY is a 82 y.o. male Date: 05/11/2017 Primary Care Physican: Janith Lima, MD Primary Cardiologist:Lori Servando Snare, NP Electrophysiologist: Allred Dry Weight:unknown Bi-V Pacing: 98%                                         Attempted call to patient and unable to reach.  Left detailed message regarding transmission.  Transmission reviewed.    Thoracic impedance normal but was abnormal suggesting fluid accumulation from 04/07/2017 to 04/28/2017.  Spironolactone 25 mg 1 tablet daily  Recommendations: Left voice mail with ICM number and encouraged to call if experiencing any fluid symptoms.  Follow-up plan: ICM clinic phone appointment on 06/13/2017.  Last visit with Truitt Merle, NP was 02/2016.   Copy of ICM check sent to Dr. Rayann Heman.   3 month ICM trend: 05/11/2017    1 Year ICM trend:       Rosalene Billings, RN 05/11/2017 3:05 PM

## 2017-05-12 ENCOUNTER — Telehealth: Payer: Self-pay

## 2017-05-12 NOTE — Telephone Encounter (Signed)
Remote ICM transmission received.  Attempted call to patient and left detailed message per DPR regarding transmission and next ICM scheduled for 06/13/2017.  Advised to return call for any fluid symptoms or questions.

## 2017-05-15 ENCOUNTER — Ambulatory Visit: Payer: Medicare Other | Admitting: Rehabilitation

## 2017-05-16 ENCOUNTER — Ambulatory Visit: Payer: Medicare Other | Admitting: Rehabilitation

## 2017-05-17 ENCOUNTER — Other Ambulatory Visit: Payer: Self-pay | Admitting: Internal Medicine

## 2017-05-17 DIAGNOSIS — F418 Other specified anxiety disorders: Secondary | ICD-10-CM

## 2017-05-18 ENCOUNTER — Encounter: Payer: Self-pay | Admitting: Internal Medicine

## 2017-05-19 DIAGNOSIS — F4323 Adjustment disorder with mixed anxiety and depressed mood: Secondary | ICD-10-CM | POA: Diagnosis not present

## 2017-05-22 ENCOUNTER — Ambulatory Visit (INDEPENDENT_AMBULATORY_CARE_PROVIDER_SITE_OTHER): Payer: Medicare Other | Admitting: *Deleted

## 2017-05-22 ENCOUNTER — Ambulatory Visit: Payer: Medicare Other | Admitting: Rehabilitation

## 2017-05-22 ENCOUNTER — Encounter: Payer: Self-pay | Admitting: Neurology

## 2017-05-22 DIAGNOSIS — Z5181 Encounter for therapeutic drug level monitoring: Secondary | ICD-10-CM

## 2017-05-22 DIAGNOSIS — I4891 Unspecified atrial fibrillation: Secondary | ICD-10-CM | POA: Diagnosis not present

## 2017-05-22 DIAGNOSIS — Z7901 Long term (current) use of anticoagulants: Secondary | ICD-10-CM | POA: Diagnosis not present

## 2017-05-22 LAB — POCT INR: INR: 2.4

## 2017-05-22 NOTE — Patient Instructions (Signed)
Description   Continue same dose of coumadin  1 tablet every day except 2 tablets on Sundays. Recheck in 2 weeks.

## 2017-05-24 ENCOUNTER — Ambulatory Visit: Payer: Medicare Other | Admitting: Rehabilitation

## 2017-05-25 ENCOUNTER — Ambulatory Visit (INDEPENDENT_AMBULATORY_CARE_PROVIDER_SITE_OTHER): Payer: Medicare Other | Admitting: Neurology

## 2017-05-25 ENCOUNTER — Encounter: Payer: Self-pay | Admitting: Neurology

## 2017-05-25 VITALS — BP 130/76 | HR 61 | Ht 72.0 in | Wt 198.0 lb

## 2017-05-25 DIAGNOSIS — G3184 Mild cognitive impairment, so stated: Secondary | ICD-10-CM

## 2017-05-25 DIAGNOSIS — F329 Major depressive disorder, single episode, unspecified: Secondary | ICD-10-CM

## 2017-05-25 DIAGNOSIS — F32A Depression, unspecified: Secondary | ICD-10-CM

## 2017-05-25 DIAGNOSIS — F4323 Adjustment disorder with mixed anxiety and depressed mood: Secondary | ICD-10-CM | POA: Diagnosis not present

## 2017-05-25 NOTE — Progress Notes (Signed)
NEUROLOGY FOLLOW UP OFFICE NOTE  Corey [name] [id number]  HISTORY OF PRESENT ILLNESS: Corey [name] is a 82 year old right-handed man with history of paroxysmal atrial fibrillation with PM, MI and CAD, TIA, hypertension, OSA, hyperlipidemia, alcohol abuse, and history of prostate cancer who follows up for cognitive impairment.  He is accompanied by his wife who supplements history.   UPDATE: I last saw Corey [name] in August 2017.  As his mild cognitive impairment may have been more affected by depression and alcohol use, Exelon was discontinued.  He was started on Zoloft 50mg  daily.  He currently is taking 100mg  daily.    He and his wife return to find out if his depression may be due to his cognitive impairment.   03/22/17 LABS:  B12 463, folate over 23.6, B1 11, TSH 4.15 04/21/17:  CT head showed chronic small vessel disease with chronic left MCA infarct     HISTORY: His wife and daughters note some mild memory problems for several years, but has significantly been more noticeable over the past 5 months, since he retired in June 2016.  Memory has been the primary problem.  He was called his daughters several times in a day asking about the same thing.  He still drives but is now mildly disoriented driving around home.  There has been no accidents or near-accidents, but he is more irritable and impulsive, and will drive a little faster and tail gate.  He has poor night vision.  They have a new grandson.  Recently, he forgot about a party for his new grandchild and instead made plans with friends.  He has begun to forget names of family and friends.  He has not had any significant change in personality other than irritability.  Besides the aggressive driving, he uses foul language a little more.  He does not act inappropriately.  He does feel depressed, as he has been going through an adjustment since retiring.  He has had more falls recently.  One time, the rug slipped from underneath him.   Another time, he tripped over a hose at the gas station.  He also had experienced bowel incontinence.  He has not had any problems at work prior to retirement.  He has not had hallucinations or delusions. He has not had difficulty performing everyday tasks.  He notes some problems but doesn't think it is as bad as his family makes it out to be.     He worked in [employer] for many years.  Since retirement, he typically doesn't do too much.  He will sit around.  He doesn't watch much TV during the day.  He reads the newspaper daily and able to retain information.  He is able to stay focus.  He and his wife are not as social as they used to be.     His mother had dementia.    Over the past several years, he has had several operations, including pacemaker and back surgery.  He attributes these changes to his multiple surgeries.  He also has depression.  He has been depressed for several years, triggered by several events in his life, such as pain due to multiple back surgeries and the passing of his daughter.  It has worsened particularly over the past two years.  He and his wife traveled around the country this past summer, but he didn't quite seem to enjoy it as much as she should.  He doesn't have any desire to go out and  be active or social.  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  His diet is poor, and he is prone to skipping meals.  He does not get much exercise due to chronic back problems.  His sleep is poor.  He wakes up several times during the night.  He takes an Ambien at times.  He is irritable and will blow up at his wife at times.  He is not physically abusive.  He has not been crying.     He did not tolerate Aricept due to vivid dreams.   CT of head from 10/05/14 showed generalized atrophy and chronic small vessel ischemic changes.  He underwent neuropsychological testing with Dr. Si Raider on 09/04/15.  Results were largely within normal limits and not  consistent with dementia, however findings suggested non-amnestic mild cognitive impairment as demonstrated by some deficits in visual-spatial construction and language and immediate encoding.  It was believed that his depression, psychosocial stressors and alcohol consumption played a large role in cognitive difficulties.  PAST MEDICAL HISTORY: Past Medical History:  Diagnosis Date  . Alcohol abuse, episodic 05/19/2008  . ALLERGIC RHINITIS 04/15/2009  . Allergy    YEAR ROUND,TAKE SHOTS  . Alzheimer's dementia    beginning with sx  . Anxiety   . Arthritis   . ASTHMA 11/16/2006   sinusitis-    hx ?yeast patch/white patch on vocal cord as per [facility] 02/25/11-   . Atrial fibrillation ([place]) 11/16/2006   remote CHF related to atrial fib with rapid ventricular response over 25 yrs per office note,  . Cancer [facility]) 2002   prostate cancer  . Chronic systolic CHF (congestive heart failure) (HCC)    echo (10/02/12): EF 17-51%, grade 1 diastolic dysfunction, trivial AI, mild MR, mild RVE  . COLONIC POLYPS, ADENOMATOUS, HX OF 02/14/2008  . CORONARY ARTERY DISEASE 11/16/2006   LHC (07/2011): LAD with luminal irregularities, proximal circumflex 50%, EF 25%  . Depression   . DIVERTICULOSIS, COLON 02/14/2008  . ESOPHAGEAL STRICTURE 01/28/2008  . GERD 02/14/2008  . Headache(784.0)   . HYPERGLYCEMIA 11/16/2006  . HYPERLIPIDEMIA 02/14/2008  . HYPERTENSION 11/16/2006  . ICD (implantable cardiac defibrillator) in place   . INSOMNIA-SLEEP DISORDER-UNSPEC 02/11/2009  . Lesion of vocal cord    bx begine  . Myocardial infarction ([place]) 1993  . NICM (nonischemic cardiomyopathy) ([place])   . OBSTRUCTIVE SLEEP APNEA 11/10/2008  . 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 Pacemaker    St. Jude  . Pneumonia    hx of  . PROSTATE CANCER, HX OF 02/25/2000  . SLEEP APNEA 10/03/2009   LOV Dr [name] 12/12 in EPIC    Moderate per patient- settings ?6/last sleep study years ago  . Sleep apnea   . Stroke ([place])    Tia  .  Symptomatic bradycardia, secondary to sinus node dysfunction 07/08/2011   s/p [facility] Scientific PPM by Dr Sallyanne Kuster, upgraded to [place] ICD 12/13 by Dr Rayann Heman ([facility])  . TRANSIENT ISCHEMIC ATTACKS, HX OF 11/16/2006  . Type 2 diabetes mellitus with complication, without long-term current use of insulin ([place]) 11/19/2016    MEDICATIONS: Current Outpatient Medications on File Prior to Visit  Medication Sig Dispense Refill  . albuterol (ACCUNEB) 0.63 MG/3ML nebulizer solution Inhale into the lungs.    Marland Kitchen albuterol (PROVENTIL HFA;VENTOLIN HFA) 108 (90 BASE) MCG/ACT inhaler Inhale 1-2 puffs into the lungs every 6 (six) hours as needed for shortness of breath. 1 Inhaler 11  . amiodarone (  PACERONE) 200 MG tablet TAKE ONE-HALF TABLET DAILY 45 tablet 3  . Empagliflozin-metFORMIN HCl (SYNJARDY) 05-998 MG TABS Take 1 tablet by mouth 2 (two) times daily. (Patient taking differently: Take 1 tablet by mouth 2 (two) times daily as needed. ) 865 tablet 1  . folic acid (FOLVITE) 1 MG tablet TAKE ONE TABLET EVERY DAY 90 tablet 1  . levalbuterol (XOPENEX) 1.25 MG/3ML nebulizer solution Inhale into the lungs as needed.     Marland Kitchen levothyroxine (SYNTHROID, LEVOTHROID) 125 MCG tablet TAKE ONE TABLET EACH DAY 90 tablet 1  . metoprolol succinate (TOPROL-XL) 50 MG 24 hr tablet Take one-half tablet by mouth every evening 15 tablet 5  . mometasone (NASONEX) 50 MCG/ACT nasal spray 1-2 puffs each nostril once or twice daily if needed (Patient taking differently: as needed. 1-2 puffs each nostril once or twice daily if needed) 17 g 0  . NONFORMULARY OR COMPOUNDED ITEM Allergy Vaccine 1:10 Given at [facility]    . pantoprazole (PROTONIX) 40 MG tablet TAKE ONE TABLET BY MOUTH ONCE DAILY 90 tablet 0  . rosuvastatin (CRESTOR) 5 MG tablet TAKE ONE TABLET DAILY AT 6PM 90 tablet 1  . sertraline (ZOLOFT) 100 MG tablet TAKE ONE TABLET EACH DAY 90 tablet 1  . spironolactone (ALDACTONE) 25 MG tablet TAKE ONE TABLET BY MOUTH ONCE DAILY 30  tablet 8  . thiamine (VITAMIN B-1) 100 MG tablet Take 1 tablet (100 mg total) by mouth daily. 90 tablet 1  . umeclidinium-vilanterol (ANORO ELLIPTA) 62.5-25 MCG/INH AEPB Inhale 1 puff into the lungs daily. (Patient not taking: Reported on 05/25/2017) 90 each 3  . warfarin (COUMADIN) 2.5 MG tablet TAKE AS DIRECTED BY COUMADIN CLINIC 40 tablet 0   No current facility-administered medications on file prior to visit.     ALLERGIES: Allergies  Allergen Reactions  . Altace [Ramipril] Cough  . Hydrocodone Other (See Comments)    Severe panic attacks  . Oxycodone Other (See Comments)    Severe panic attacks    FAMILY HISTORY: Family History  Problem Relation Age of Onset  . Heart attack Father   . Heart attack Brother   . Heart disease Maternal Uncle   . Heart attack Maternal Uncle   . Sudden death Daughter 67       unknown  . Autism Son   . Colon cancer Neg Hx   . Esophageal cancer Neg Hx   . Rectal cancer Neg Hx   . Prostate cancer Neg Hx   . Stomach cancer Neg Hx     SOCIAL HISTORY: Social History   Socioeconomic History  . Marital status: Married    Spouse name: [name]  . Number of children: 4  . Years of education: Not on file  . Highest education level: Bachelor's degree (e.g., BA, AB, BS)  Occupational History  . Occupation: Retired  Scientific laboratory technician  . Financial resource strain: Not on file  . Food insecurity:    Worry: Not on file    Inability: Not on file  . Transportation needs:    Medical: Not on file    Non-medical: Not on file  Tobacco Use  . Smoking status: Former Smoker    Packs/day: 1.00    Years: 4.00    Pack years: 4.00    Types: Cigarettes    Last attempt to quit: 03/22/1964    Years since quitting: 53.2  . Smokeless tobacco: Never Used  . Tobacco comment: reports smoked socially  Substance and Sexual Activity  . Alcohol  use: Yes    Alcohol/week: 21.0 oz    Types: 35 Shots of liquor per week    Comment: 2 mixed drinks daily, some days  . Drug  use: No  . Sexual activity: Yes    Partners: Female  Lifestyle  . Physical activity:    Days per week: Not on file    Minutes per session: Not on file  . Stress: Not on file  Relationships  . Social connections:    Talks on phone: Not on file    Gets together: Not on file    Attends religious service: Not on file    Active member of club or organization: Not on file    Attends meetings of clubs or organizations: Not on file    Relationship status: Not on file  . Intimate partner violence:    Fear of current or ex partner: Not on file    Emotionally abused: Not on file    Physically abused: Not on file    Forced sexual activity: Not on file  Other Topics Concern  . Not on file  Social History Narrative   Pt is right handed. He drinks coffee 3 x week, and tea 4 x week. He walks occasionally.    REVIEW OF SYSTEMS: Constitutional: No fevers, chills, or sweats, no generalized fatigue, change in appetite Eyes: No visual changes, double vision, eye pain Ear, nose and throat: No hearing loss, ear pain, nasal congestion, sore throat Cardiovascular: No chest pain, palpitations Respiratory:  No shortness of breath at rest or with exertion, wheezes GastrointestinaI: No nausea, vomiting, diarrhea, abdominal pain, fecal incontinence Genitourinary:  No dysuria, urinary retention or frequency Musculoskeletal:  No neck pain, back pain Integumentary: No rash, pruritus, skin lesions Neurological: as above Psychiatric: depression Endocrine: No palpitations, fatigue, diaphoresis, mood swings, change in appetite, change in weight, increased thirst Hematologic/Lymphatic:  No purpura, petechiae. Allergic/Immunologic: no itchy/runny eyes, nasal congestion, recent allergic reactions, rashes  PHYSICAL EXAM: Vitals:   05/25/17 1120  BP: 130/76  Pulse: 61  SpO2: 97%   General: No acute distress.  Patient appears well-groomed.   Head:  Normocephalic/atraumatic Eyes:  Fundi examined but not  visualized Neck: supple, no paraspinal tenderness, full range of motion Heart:  Regular rate and rhythm Lungs:  Clear to auscultation bilaterally Back: No paraspinal tenderness Neurological Exam: alert and oriented to person, place, and time. Attention span and concentration intact, recent and remote memory intact, fund of knowledge intact.  Speech fluent and not dysarthric, language intact.  Visuospatial and executive functioning impaired in regards to completing Trail Making Test, copying a cube and drawing a clock. Montreal Cognitive Assessment  05/25/2017 04/02/2014 10/09/2013  Visuospatial/ Executive (0/5) 2 5 5   Naming (0/3) 3 3 3   Attention: Read list of digits (0/2) 2 2 2   Attention: Read list of letters (0/1) 1 1 1   Attention: Serial 7 subtraction starting at 100 (0/3) 3 3 3   Language: Repeat phrase (0/2) 2 2 2   Language : Fluency (0/1) 1 1 1   Abstraction (0/2) 2 2 2   Delayed Recall (0/5) 0 2 2  Orientation (0/6) 6 6 6   Total 22 27 27   Adjusted Score (based on education) - 27 -   CN II-XII intact. Bulk and tone normal, muscle strength 5/5 throughout.  Sensation to light touch  intact.  Deep tendon reflexes 2+ throughout.  Finger to nose testing intact.  Gait normal, Romberg negative.  IMPRESSION: Cognitive impairment.  On neurocognitive testing from 2017, findings  were thought to be secondary to depression or alcohol use.  My suspicion is that his depression isn't directly caused by his cognitive impairment, however treatment with antidepressants would still apply.  PLAN: 1.  Advised to discuss increasing sertraline to 150mg  daily with Dr. Ronnald Ramp. 2.  We will repeat neurocognitive testing to look for any changes in performance to suggest an alternative diagnosis.  If a neurodegenerative disease is now suspected, he will follow up with me.  27 minutes spent face to face with patient, over 50% spent discussing management.  Metta Clines, DO  CC: Dr. Ronnald Ramp

## 2017-05-25 NOTE — Patient Instructions (Signed)
We will repeat neurocognitive testing with Dr. Si Raider.  If there are any changes that require follow up with me, we will schedule that.

## 2017-05-26 ENCOUNTER — Encounter: Payer: Self-pay | Admitting: Internal Medicine

## 2017-06-01 LAB — HM DIABETES EYE EXAM

## 2017-06-05 ENCOUNTER — Ambulatory Visit (INDEPENDENT_AMBULATORY_CARE_PROVIDER_SITE_OTHER): Payer: Medicare Other | Admitting: Pharmacist

## 2017-06-05 DIAGNOSIS — Z7901 Long term (current) use of anticoagulants: Secondary | ICD-10-CM

## 2017-06-05 DIAGNOSIS — Z5181 Encounter for therapeutic drug level monitoring: Secondary | ICD-10-CM | POA: Diagnosis not present

## 2017-06-05 DIAGNOSIS — I4891 Unspecified atrial fibrillation: Secondary | ICD-10-CM | POA: Diagnosis not present

## 2017-06-05 LAB — POCT INR: INR: 1.6

## 2017-06-05 NOTE — Patient Instructions (Signed)
Description   Take 1.5 tablets today and tomorrow, then continue same dose of Coumadin 1 tablet every day except 2 tablets on Sundays. Recheck in 2 weeks.

## 2017-06-13 ENCOUNTER — Ambulatory Visit (INDEPENDENT_AMBULATORY_CARE_PROVIDER_SITE_OTHER): Payer: Medicare Other | Admitting: *Deleted

## 2017-06-13 ENCOUNTER — Telehealth: Payer: Self-pay | Admitting: Cardiology

## 2017-06-13 DIAGNOSIS — I495 Sick sinus syndrome: Secondary | ICD-10-CM

## 2017-06-13 DIAGNOSIS — I5022 Chronic systolic (congestive) heart failure: Secondary | ICD-10-CM

## 2017-06-13 DIAGNOSIS — Z95810 Presence of automatic (implantable) cardiac defibrillator: Secondary | ICD-10-CM

## 2017-06-13 NOTE — Telephone Encounter (Signed)
LMOVM reminding pt to send remote transmission.   

## 2017-06-14 NOTE — Progress Notes (Signed)
Remote ICD transmission.   

## 2017-06-15 ENCOUNTER — Encounter: Payer: Self-pay | Admitting: Cardiology

## 2017-06-15 LAB — CUP PACEART REMOTE DEVICE CHECK
Battery Remaining Longevity: 24 mo
Battery Remaining Percentage: 30 %
Battery Voltage: 2.86 V
Brady Statistic AS VP Percent: 8.3 %
HighPow Impedance: 82 Ohm
HighPow Impedance: 82 Ohm
Implantable Lead Implant Date: 20131223
Implantable Lead Implant Date: 20131223
Implantable Lead Location: 753859
Implantable Lead Location: 753860
Implantable Lead Model: 7122
Implantable Lead Serial Number: 29201222
Implantable Pulse Generator Implant Date: 20131223
Lead Channel Impedance Value: 1075 Ohm
Lead Channel Impedance Value: 530 Ohm
Lead Channel Pacing Threshold Amplitude: 0.75 V
Lead Channel Pacing Threshold Amplitude: 1.25 V
Lead Channel Pacing Threshold Pulse Width: 0.5 ms
Lead Channel Pacing Threshold Pulse Width: 0.5 ms
Lead Channel Sensing Intrinsic Amplitude: 1.8 mV
Lead Channel Sensing Intrinsic Amplitude: 12 mV
Lead Channel Setting Pacing Amplitude: 2 V
Lead Channel Setting Pacing Amplitude: 2 V
Lead Channel Setting Pacing Amplitude: 2.5 V
Lead Channel Setting Pacing Pulse Width: 0.5 ms
Lead Channel Setting Sensing Sensitivity: 0.5 mV
MDC IDC LEAD IMPLANT DT: 20130614
MDC IDC LEAD LOCATION: 753858
MDC IDC MSMT LEADCHNL RA PACING THRESHOLD AMPLITUDE: 1 V
MDC IDC MSMT LEADCHNL RA PACING THRESHOLD PULSEWIDTH: 0.5 ms
MDC IDC MSMT LEADCHNL RV IMPEDANCE VALUE: 460 Ohm
MDC IDC PG SERIAL: 7057550
MDC IDC SESS DTM: 20190521202915
MDC IDC SET LEADCHNL LV PACING PULSEWIDTH: 0.5 ms
MDC IDC STAT BRADY AP VP PERCENT: 87 %
MDC IDC STAT BRADY AP VS PERCENT: 2 %
MDC IDC STAT BRADY AS VS PERCENT: 1.1 %
MDC IDC STAT BRADY RA PERCENT PACED: 88 %

## 2017-06-15 NOTE — Progress Notes (Signed)
EPIC Encounter for ICM Monitoring  Patient Name: Corey [name] is a 82 y.o. male Date: 06/15/2017 Primary Care Physican: Janith Lima, MD Primary Cardiologist:Lori Servando Snare, NP Electrophysiologist: Allred Dry Weight:unknown Bi-V Pacing: 95%      Attempted call to patient and unable to reach.  Left detailed message, per DPR, regarding transmission.  Transmission reviewed. .   Thoracic impedance normal on 06/14/2017 but was abnormal suggesting fluid accumulation from 05/20/2017 - 06/03/2017 and was above baseline suggesting dryness 06/03/2017 to 06/13/2017.  Recommendations: Left voice mail with ICM number and encouraged to call if experiencing any fluid symptoms.  Follow-up plan: ICM clinic phone appointment on 07/17/2017.   Copy of ICM check sent to Dr. Rayann Heman.   3 month ICM trend: 06/14/2017    1 Year ICM trend:       Rosalene Billings, RN 06/15/2017 2:23 PM

## 2017-06-16 ENCOUNTER — Other Ambulatory Visit: Payer: Self-pay | Admitting: Gastroenterology

## 2017-06-16 ENCOUNTER — Other Ambulatory Visit: Payer: Self-pay | Admitting: Internal Medicine

## 2017-06-21 ENCOUNTER — Encounter: Payer: Self-pay | Admitting: Internal Medicine

## 2017-06-21 ENCOUNTER — Ambulatory Visit (INDEPENDENT_AMBULATORY_CARE_PROVIDER_SITE_OTHER): Payer: Medicare Other | Admitting: *Deleted

## 2017-06-21 ENCOUNTER — Ambulatory Visit (INDEPENDENT_AMBULATORY_CARE_PROVIDER_SITE_OTHER): Payer: Medicare Other | Admitting: Internal Medicine

## 2017-06-21 VITALS — BP 126/72 | HR 66 | Temp 98.3°F | Resp 16 | Ht 72.0 in | Wt 201.0 lb

## 2017-06-21 DIAGNOSIS — E118 Type 2 diabetes mellitus with unspecified complications: Secondary | ICD-10-CM | POA: Diagnosis not present

## 2017-06-21 DIAGNOSIS — Z7901 Long term (current) use of anticoagulants: Secondary | ICD-10-CM

## 2017-06-21 DIAGNOSIS — I10 Essential (primary) hypertension: Secondary | ICD-10-CM | POA: Diagnosis not present

## 2017-06-21 DIAGNOSIS — Z5181 Encounter for therapeutic drug level monitoring: Secondary | ICD-10-CM | POA: Diagnosis not present

## 2017-06-21 DIAGNOSIS — I480 Paroxysmal atrial fibrillation: Secondary | ICD-10-CM

## 2017-06-21 DIAGNOSIS — I4891 Unspecified atrial fibrillation: Secondary | ICD-10-CM

## 2017-06-21 DIAGNOSIS — Z0000 Encounter for general adult medical examination without abnormal findings: Secondary | ICD-10-CM

## 2017-06-21 LAB — POCT GLYCOSYLATED HEMOGLOBIN (HGB A1C): Hemoglobin A1C: 6.4 % — AB (ref 4.0–5.6)

## 2017-06-21 LAB — HEMOGLOBIN A1C
HEMOGLOBIN A1C: 6.9
HEMOGLOBIN A1C: 6.9
Hemoglobin A1C: 6.9

## 2017-06-21 LAB — POCT INR: INR: 1.8 — AB (ref 2.0–3.0)

## 2017-06-21 NOTE — Progress Notes (Signed)
Subjective:  Patient ID: Corey [name], male    DOB: October 12, 1935  Age: 82 y.o. MRN: [id number]  CC: Diabetes   HPI Corey [name] presents for f/up -he tells me his blood sugars have been well controlled.  He is not taking the SGLT2 inhibitor/metformin combination because he does not think he needs it.  He is decreased his intake of carbohydrates and feels like he is more active.  He denies polys.  Outpatient Medications Prior to Visit  Medication Sig Dispense Refill  . albuterol (ACCUNEB) 0.63 MG/3ML nebulizer solution Inhale into the lungs.    Marland Kitchen albuterol (PROVENTIL HFA;VENTOLIN HFA) 108 (90 BASE) MCG/ACT inhaler Inhale 1-2 puffs into the lungs every 6 (six) hours as needed for shortness of breath. 1 Inhaler 11  . amiodarone (PACERONE) 200 MG tablet TAKE ONE-HALF TABLET DAILY 45 tablet 3  . folic acid (FOLVITE) 1 MG tablet TAKE ONE TABLET EACH DAY 90 tablet 1  . levalbuterol (XOPENEX) 1.25 MG/3ML nebulizer solution Inhale into the lungs as needed.     Marland Kitchen levothyroxine (SYNTHROID, LEVOTHROID) 125 MCG tablet TAKE ONE TABLET EACH DAY 90 tablet 1  . metoprolol succinate (TOPROL-XL) 50 MG 24 hr tablet Take one-half tablet by mouth every evening 15 tablet 5  . mometasone (NASONEX) 50 MCG/ACT nasal spray 1-2 puffs each nostril once or twice daily if needed (Patient taking differently: as needed. 1-2 puffs each nostril once or twice daily if needed) 17 g 0  . pantoprazole (PROTONIX) 40 MG tablet TAKE ONE TABLET EACH DAY 90 tablet 0  . rosuvastatin (CRESTOR) 5 MG tablet TAKE ONE TABLET DAILY AT 6PM 90 tablet 1  . sertraline (ZOLOFT) 100 MG tablet TAKE ONE TABLET EACH DAY 90 tablet 1  . spironolactone (ALDACTONE) 25 MG tablet TAKE ONE TABLET BY MOUTH ONCE DAILY 30 tablet 8  . thiamine (VITAMIN B-1) 100 MG tablet Take 1 tablet (100 mg total) by mouth daily. 90 tablet 1  . warfarin (COUMADIN) 2.5 MG tablet TAKE AS DIRECTED BY COUMADIN CLINIC 40 tablet 1  . Empagliflozin-metFORMIN HCl (SYNJARDY)  05-998 MG TABS Take 1 tablet by mouth 2 (two) times daily. (Patient not taking: Reported on 06/21/2017) 180 tablet 1   No facility-administered medications prior to visit.     ROS Review of Systems  Constitutional: Negative.  Negative for diaphoresis and fatigue.  HENT: Negative.   Eyes: Negative for visual disturbance.  Respiratory: Negative for cough, chest tightness, shortness of breath and wheezing.   Cardiovascular: Negative.  Negative for chest pain, palpitations and leg swelling.  Gastrointestinal: Negative for abdominal pain, constipation, diarrhea, nausea and vomiting.  Endocrine: Negative for polydipsia, polyphagia and polyuria.  Genitourinary: Negative.  Negative for difficulty urinating, dysuria and urgency.  Musculoskeletal: Negative.  Negative for arthralgias and myalgias.  Skin: Negative.   Neurological: Negative.  Negative for dizziness, weakness and light-headedness.  Hematological: Negative for adenopathy. Does not bruise/bleed easily.  Psychiatric/Behavioral: Negative.     Objective:  BP 126/72 (BP Location: Left Arm, Patient Position: Sitting, Cuff Size: Normal)   Pulse 66   Temp 98.3 F (36.8 C) (Oral)   Resp 16   Ht 6' (1.829 m)   Wt 201 lb (91.2 kg)   SpO2 98%   BMI 27.26 kg/m   BP Readings from Last 3 Encounters:  06/21/17 126/72  06/21/17 126/72  05/25/17 130/76    Wt Readings from Last 3 Encounters:  06/21/17 201 lb (91.2 kg)  06/21/17 201 lb (91.2 kg)  05/25/17 198  lb (89.8 kg)    Physical Exam  Constitutional: He is oriented to person, place, and time. No distress.  HENT:  Mouth/Throat: Oropharynx is clear and moist. No oropharyngeal exudate.  Eyes: Conjunctivae are normal. No scleral icterus.  Neck: Normal range of motion. Neck supple. No JVD present. No thyromegaly present.  Cardiovascular: Normal rate, regular rhythm and normal heart sounds.  No murmur heard. Pulmonary/Chest: Effort normal and breath sounds normal. No respiratory  distress. He has no wheezes. He has no rales.  Abdominal: Soft. Bowel sounds are normal. He exhibits no mass. There is no hepatosplenomegaly. There is no tenderness.  Musculoskeletal: Normal range of motion. He exhibits no edema, tenderness or deformity.  Lymphadenopathy:    He has no cervical adenopathy.  Neurological: He is alert and oriented to person, place, and time.  Skin: Skin is warm and dry. He is not diaphoretic.  Vitals reviewed.   Lab Results  Component Value Date   WBC 6.3 03/22/2017   HGB 13.9 03/22/2017   HCT 40.9 03/22/2017   PLT 166.0 03/22/2017   GLUCOSE 144 (H) 03/22/2017   CHOL 168 11/16/2016   TRIG 147.0 11/16/2016   HDL 40.20 11/16/2016   LDLCALC 99 11/16/2016   ALT 36 05/01/2017   AST 55 (H) 05/01/2017   NA 136 03/22/2017   K 4.4 03/22/2017   CL 99 03/22/2017   CREATININE 1.23 03/22/2017   BUN 15 03/22/2017   CO2 30 03/22/2017   TSH 4.15 03/22/2017   PSA <0.015 05/26/2016   INR 1.8 (A) 06/21/2017   HGBA1C 6.4 (A) 06/21/2017   MICROALBUR 10.7 (H) 03/22/2017    Ct Head Wo Contrast  Result Date: 04/21/2017 CLINICAL DATA:  Fall at physical therapy today. Hit left side of head. Initial encounter. EXAM: CT HEAD WITHOUT CONTRAST TECHNIQUE: Contiguous axial images were obtained from the base of the skull through the vertex without intravenous contrast. COMPARISON:  06/03/2016 FINDINGS: Brain: There is no evidence of acute infarct, intracranial hemorrhage, mass, midline shift, or extra-axial fluid collection. 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 Bilateral periventricular white matter hypodensities are similar to the prior study and nonspecific but compatible with mild chronic small vessel ischemic disease. Mild cerebral atrophy is unchanged. Vascular: Calcified atherosclerosis at the skull base. No hyperdense vessel. Skull: No fracture or suspicious osseous lesion. Sinuses/Orbits: New, near complete opacification of  the right maxillary sinus. Mild left greater than right ethmoid air cell opacification. Clear mastoid air cells. Unremarkable orbits. Other: None. IMPRESSION: 1. No evidence of acute intracranial abnormality. 2. Chronic ischemia as above. 3. Right maxillary sinusitis. Electronically Signed   By: Logan Bores M.D.   On: 04/21/2017 17:59    Assessment & Plan:   Deep was seen today for diabetes.  Diagnoses and all orders for this visit:  Type 2 diabetes mellitus with complication, without long-term current use of insulin ([place])- His A1c is down to 6.9%.  His blood sugars are adequately well controlled on no meds.  Will discontinue Synjardy -     POCT glycosylated hemoglobin (Hb A1C)  Essential hypertension- His blood pressure is adequately well controlled.  Paroxysmal atrial fibrillation ([place])- He is maintaining sinus rhythm.  Will continue metoprolol at the current dose.  Will continue anticoagulation with warfarin.   I have discontinued Shelly Rubenstein Empagliflozin-metFORMIN HCl. I am also having him maintain his albuterol, levalbuterol, albuterol, mometasone, amiodarone, thiamine, levothyroxine, spironolactone, metoprolol succinate, rosuvastatin, sertraline, warfarin, pantoprazole, and folic acid.  No orders of the defined types were placed in this encounter.    Follow-up: Return in about 4 months (around 10/22/2017).  Scarlette Calico, MD

## 2017-06-21 NOTE — Patient Instructions (Signed)

## 2017-06-21 NOTE — Patient Instructions (Addendum)
Continue doing brain stimulating activities (puzzles, reading, adult coloring books, staying active) to keep memory sharp.   Continue to eat heart healthy diet (full of fruits, vegetables, whole grains, lean protein, water--limit salt, fat, and sugar intake) and increase physical activity as tolerated.   Corey [name] , Thank you for taking time to come for your Medicare Wellness Visit. I appreciate your ongoing commitment to your health goals. Please review the following plan we discussed and let me know if I can assist you in the future.   These are the goals we discussed: Goals    . Patient Stated     Maintain current health status and healthy and as independent as possible. Enjoy life, family and travel       This is a list of the screening recommended for you and due dates:  Health Maintenance  Topic Date Due  . Flu Shot  08/24/2017  . Hemoglobin A1C  09/19/2017  . Complete foot exam   03/22/2018  . Urine Protein Check  03/22/2018  . Eye exam for diabetics  05/12/2018  . Tetanus Vaccine  10/07/2024  . Pneumonia vaccines  Completed     Health Maintenance, Male A healthy lifestyle and preventive care is important for your health and wellness. Ask your health care provider about what schedule of regular examinations is right for you. What should I know about weight and diet? Eat a Healthy Diet  Eat plenty of vegetables, fruits, whole grains, low-fat dairy products, and lean protein.  Do not eat a lot of foods high in solid fats, added sugars, or salt.  Maintain a Healthy Weight Regular exercise can help you achieve or maintain a healthy weight. You should:  Do at least 150 minutes of exercise each week. The exercise should increase your heart rate and make you sweat (moderate-intensity exercise).  Do strength-training exercises at least twice a week.  Watch Your Levels of Cholesterol and Blood Lipids  Have your blood tested for lipids and cholesterol every 5 years  starting at 80 years of age. If you are at high risk for heart disease, you should start having your blood tested when you are 80 years old. You may need to have your cholesterol levels checked more often if: ? Your lipid or cholesterol levels are high. ? You are older than 80 years of age. ? You are at high risk for heart disease.  What should I know about cancer screening? Many types of cancers can be detected early and may often be prevented. Lung Cancer  You should be screened every year for lung cancer if: ? You are a current smoker who has smoked for at least 30 years. ? You are a former smoker who has quit within the past 15 years.  Talk to your health care provider about your screening options, when you should start screening, and how often you should be screened.  Colorectal Cancer  Routine colorectal cancer screening usually begins at 80 years of age and should be repeated every 5-10 years until you are 80 years old. You may need to be screened more often if early forms of precancerous polyps or small growths are found. Your health care provider may recommend screening at an earlier age if you have risk factors for colon cancer.  Your health care provider may recommend using home test kits to check for hidden blood in the stool.  A small camera at the end of a tube can be used to examine your colon (sigmoidoscopy  or colonoscopy). This checks for the earliest forms of colorectal cancer.  Prostate and Testicular Cancer  Depending on your age and overall health, your health care provider may do certain tests to screen for prostate and testicular cancer.  Talk to your health care provider about any symptoms or concerns you have about testicular or prostate cancer.  Skin Cancer  Check your skin from head to toe regularly.  Tell your health care provider about any new moles or changes in moles, especially if: ? There is a change in a mole's size, shape, or color. ? You have a  mole that is larger than a pencil eraser.  Always use sunscreen. Apply sunscreen liberally and repeat throughout the day.  Protect yourself by wearing long sleeves, pants, a wide-brimmed hat, and sunglasses when outside.  What should I know about heart disease, diabetes, and high blood pressure?  If you are 23-92 years of age, have your blood pressure checked every 3-5 years. If you are 7 years of age or older, have your blood pressure checked every year. You should have your blood pressure measured twice-once when you are at a hospital or clinic, and once when you are not at a hospital or clinic. Record the average of the two measurements. To check your blood pressure when you are not at a hospital or clinic, you can use: ? An automated blood pressure machine at a pharmacy. ? A home blood pressure monitor.  Talk to your health care provider about your target blood pressure.  If you are between 60-80 years old, ask your health care provider if you should take aspirin to prevent heart disease.  Have regular diabetes screenings by checking your fasting blood sugar level. ? If you are at a normal weight and have a low risk for diabetes, have this test once every three years after the age of 37. ? If you are overweight and have a high risk for diabetes, consider being tested at a younger age or more often.  A one-time screening for abdominal aortic aneurysm (AAA) by ultrasound is recommended for men aged 66-75 years who are current or former smokers. What should I know about preventing infection? Hepatitis B If you have a higher risk for hepatitis B, you should be screened for this virus. Talk with your health care provider to find out if you are at risk for hepatitis B infection. Hepatitis C Blood testing is recommended for:  Everyone born from [birth year] through 1963.  Anyone with known risk factors for hepatitis C.  Sexually Transmitted Diseases (STDs)  You should be screened each year for  STDs including gonorrhea and chlamydia if: ? You are sexually active and are younger than 80 years of age. ? You are older than 80 years of age and your health care provider tells you that you are at risk for this type of infection. ? Your sexual activity has changed since you were last screened and you are at an increased risk for chlamydia or gonorrhea. Ask your health care provider if you are at risk.  Talk with your health care provider about whether you are at high risk of being infected with HIV. Your health care provider may recommend a prescription medicine to help prevent HIV infection.  What else can I do?  Schedule regular health, dental, and eye exams.  Stay current with your vaccines (immunizations).  Do not use any tobacco products, such as cigarettes, chewing tobacco, and e-cigarettes. If you need help quitting, ask your  health care provider.  Limit alcohol intake to no more than 2 drinks per day. One drink equals 12 ounces of beer, 5 ounces of wine, or 1 ounces of hard liquor.  Do not use street drugs.  Do not share needles.  Ask your health care provider for help if you need support or information about quitting drugs.  Tell your health care provider if you often feel depressed.  Tell your health care provider if you have ever been abused or do not feel safe at home. This information is not intended to replace advice given to you by your health care provider. Make sure you discuss any questions you have with your health care provider. Document Released: 07/09/2007 Document Revised: 09/09/2015 Document Reviewed: 10/14/2014 Elsevier Interactive Patient Education  [employer].

## 2017-06-21 NOTE — Patient Instructions (Signed)
Description   Today May 29th take 1 and 1/2 tablets then continue same dose of Coumadin 1 tablet every day except 2 tablets on Sundays. Recheck in 2 weeks. Leaving Friday for [facility] for 2 months RX given for him to have INR checked while he is there Pt's cell phone number is [phone]

## 2017-06-21 NOTE — Progress Notes (Addendum)
Subjective:   Corey [name] is a 82 y.o. male who presents for an Initial Medicare Annual Wellness Visit.  Review of Systems  No ROS.  Medicare Wellness Visit. Additional risk factors are reflected in the social history.  Cardiac Risk Factors include: advanced age (>18men, >78 women);diabetes mellitus;hypertension;male gender;dyslipidemia Sleep patterns: has interrupted sleep, gets up 2-3 times nightly to void and sleeps 6 hours nightly.    Home Safety/Smoke Alarms: Feels safe in home. Smoke alarms in place.  Living environment; residence and Firearm Safety: 2-story house, no firearms. Lives with wife, no needs for DME, good support system Seat Belt Safety/Bike Helmet: Wears seat belt.    PSA-  Lab Results  Component Value Date   PSA <0.015 05/26/2016   PSA 0.02 (L) 11/13/2013   PSA 0.03 (L) 12/13/2006      Objective:    Today's Vitals   06/21/17 1424 06/21/17 1429  BP: 126/72   Pulse: 66   Resp: 16   Temp: 98.3 F (36.8 C)   SpO2: 98%   Weight: 201 lb (91.2 kg)   Height: 6' (1.829 m)   PainSc:  1    Body mass index is 27.26 kg/m.  Advanced Directives 06/21/2017 03/27/2017 03/23/2017 12/09/2016 06/03/2016 10/06/2014 10/05/2014  Does Patient Have a Medical Advance Directive? Yes Yes Yes No Yes Yes No  Type of Paramedic of [place];Living will [place];Living will [place];Living will - Living will Living will;Healthcare Power of Attorney -  Does patient want to make changes to medical advance directive? - No - Patient declined - - - - -  Copy of [place] in Chart? No - copy requested - No - copy requested - - - -  Would patient like information on creating a medical advance directive? - - - No - Patient declined - - -  Pre-existing out of facility DNR order (yellow form or pink MOST form) - - - - - - -    Current Medications (verified) Outpatient Encounter Medications as of 06/21/2017   Medication Sig  . albuterol (ACCUNEB) 0.63 MG/3ML nebulizer solution Inhale into the lungs.  Marland Kitchen albuterol (PROVENTIL HFA;VENTOLIN HFA) 108 (90 BASE) MCG/ACT inhaler Inhale 1-2 puffs into the lungs every 6 (six) hours as needed for shortness of breath.  Marland Kitchen amiodarone (PACERONE) 200 MG tablet TAKE ONE-HALF TABLET DAILY  . folic acid (FOLVITE) 1 MG tablet TAKE ONE TABLET EACH DAY  . levalbuterol (XOPENEX) 1.25 MG/3ML nebulizer solution Inhale into the lungs as needed.   Marland Kitchen levothyroxine (SYNTHROID, LEVOTHROID) 125 MCG tablet TAKE ONE TABLET EACH DAY  . metoprolol succinate (TOPROL-XL) 50 MG 24 hr tablet Take one-half tablet by mouth every evening  . mometasone (NASONEX) 50 MCG/ACT nasal spray 1-2 puffs each nostril once or twice daily if needed (Patient taking differently: as needed. 1-2 puffs each nostril once or twice daily if needed)  . pantoprazole (PROTONIX) 40 MG tablet TAKE ONE TABLET EACH DAY  . rosuvastatin (CRESTOR) 5 MG tablet TAKE ONE TABLET DAILY AT 6PM  . sertraline (ZOLOFT) 100 MG tablet TAKE ONE TABLET EACH DAY  . spironolactone (ALDACTONE) 25 MG tablet TAKE ONE TABLET BY MOUTH ONCE DAILY  . thiamine (VITAMIN B-1) 100 MG tablet Take 1 tablet (100 mg total) by mouth daily.  Marland Kitchen warfarin (COUMADIN) 2.5 MG tablet TAKE AS DIRECTED BY COUMADIN CLINIC  . Empagliflozin-metFORMIN HCl (SYNJARDY) 05-998 MG TABS Take 1 tablet by mouth 2 (two) times daily. (Patient  not taking: Reported on 06/21/2017)  . [DISCONTINUED] NONFORMULARY OR COMPOUNDED ITEM Allergy Vaccine 1:10 Given at [facility]  . [DISCONTINUED] umeclidinium-vilanterol (ANORO ELLIPTA) 62.5-25 MCG/INH AEPB Inhale 1 puff into the lungs daily. (Patient not taking: Reported on 05/25/2017)   No facility-administered encounter medications on file as of 06/21/2017.     Allergies (verified) Altace [ramipril]; Hydrocodone; and Oxycodone   History: Past Medical History:  Diagnosis Date  . Alcohol abuse, episodic 05/19/2008  .  ALLERGIC RHINITIS 04/15/2009  . Allergy    YEAR ROUND,TAKE SHOTS  . Alzheimer's dementia    beginning with sx  . Anxiety   . Arthritis   . ASTHMA 11/16/2006   sinusitis-    hx ?yeast patch/white patch on vocal cord as per [facility] 02/25/11-   . Atrial fibrillation ([place]) 11/16/2006   remote CHF related to atrial fib with rapid ventricular response over 25 yrs per office note,  . Cancer [facility]) 2002   prostate cancer  . Chronic systolic CHF (congestive heart failure) (HCC)    echo (10/02/12): EF 60-73%, grade 1 diastolic dysfunction, trivial AI, mild MR, mild RVE  . COLONIC POLYPS, ADENOMATOUS, HX OF 02/14/2008  . CORONARY ARTERY DISEASE 11/16/2006   LHC (07/2011): LAD with luminal irregularities, proximal circumflex 50%, EF 25%  . Depression   . DIVERTICULOSIS, COLON 02/14/2008  . ESOPHAGEAL STRICTURE 01/28/2008  . GERD 02/14/2008  . Headache(784.0)   . HYPERGLYCEMIA 11/16/2006  . HYPERLIPIDEMIA 02/14/2008  . HYPERTENSION 11/16/2006  . ICD (implantable cardiac defibrillator) in place   . INSOMNIA-SLEEP DISORDER-UNSPEC 02/11/2009  . Lesion of vocal cord    bx begine  . Myocardial infarction ([place]) 1993  . NICM (nonischemic cardiomyopathy) ([place])   . OBSTRUCTIVE SLEEP APNEA 11/10/2008  . 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 Pacemaker    St. Jude  . Pneumonia    hx of  . PROSTATE CANCER, HX OF 02/25/2000  . SLEEP APNEA 10/03/2009   LOV Dr [name] 12/12 in EPIC    Moderate per patient- settings ?6/last sleep study years ago  . Sleep apnea   . Stroke ([place])    Tia  . Symptomatic bradycardia, secondary to sinus node dysfunction 07/08/2011   s/p [facility] Scientific PPM by Dr Sallyanne Kuster, upgraded to [place] ICD 12/13 by Dr Rayann Heman (SJM)  . TRANSIENT ISCHEMIC ATTACKS, HX OF 11/16/2006  . Type 2 diabetes mellitus with complication, without long-term current use of insulin ([place]) 11/19/2016   Past Surgical History:  Procedure Laterality Date  . BACK SURGERY    . BI-VENTRICULAR IMPLANTABLE CARDIOVERTER  DEFIBRILLATOR UPGRADE N/A 01/16/2012   Procedure: BI-VENTRICULAR IMPLANTABLE CARDIOVERTER DEFIBRILLATOR UPGRADE;  Surgeon: Thompson Grayer, MD;  Location[facility] CATH LAB;  Service: Cardiovascular;  Laterality: N/A;  . CARDIAC CATHETERIZATION     several  . CARDIAC DEFIBRILLATOR PLACEMENT  01/16/12   Upgrade to a biventricular SJM ICD by DR Allred  . COLONOSCOPY    . ESOPHAGOGASTRODUODENOSCOPY     with dilitation  . INSERT / REPLACE / REMOVE PACEMAKER    . KNEE ARTHROSCOPY     2 surgeries right and 1 left  . LEFT AND RIGHT HEART CATHETERIZATION WITH CORONARY ANGIOGRAM N/A 08/18/2011   Procedure: LEFT AND RIGHT HEART CATHETERIZATION WITH CORONARY ANGIOGRAM;  Surgeon: [name], MD;  Location: [facility] CATH LAB;  Service: Cardiovascular;  Laterality: N/A;  . LUMBAR LAMINECTOMY/DECOMPRESSION MICRODISCECTOMY  03/02/2011   Procedure: LUMBAR LAMINECTOMY/DECOMPRESSION MICRODISCECTOMY;  Surgeon: [name], MD;  Location: [facility] ORS;  Service: Orthopedics;  Laterality: N/A;  Decompression  Lumbar 4 - Lumbar(X-Ray)  . PACEMAKER INSERTION  06/2011   [employer] PPM implanted by Dr Sallyanne Kuster  . PERMANENT PACEMAKER INSERTION Left 07/08/2011   Procedure: PERMANENT PACEMAKER INSERTION;  Surgeon: [name], MD;  Location: [facility] CATH LAB;  Service: Cardiovascular;  Laterality: Left;  . PROSTATE SURGERY  2/02   prostate cancer  . TONSILECTOMY, ADENOIDECTOMY, BILATERAL MYRINGOTOMY AND TUBES    . TONSILLECTOMY  as child  . TOTAL KNEE ARTHROPLASTY Left 06/04/2012   Procedure: TOTAL KNEE ARTHROPLASTY;  Surgeon: [name], MD;  Location: [facility] ORS;  Service: Orthopedics;  Laterality: Left;  . UVULOPALATOPHARYNGOPLASTY  2013  . vocal cord lesion bx     Family History  Problem Relation Age of Onset  . Heart attack Father   . Heart attack Brother   . Heart disease Maternal Uncle   . Heart attack Maternal Uncle   . Sudden death Daughter 43       unknown  . Autism Son   . Colon cancer Neg Hx   . Esophageal  cancer Neg Hx   . Rectal cancer Neg Hx   . Prostate cancer Neg Hx   . Stomach cancer Neg Hx    Social History   Socioeconomic History  . Marital status: Married    Spouse name: [name]  . Number of children: 4  . Years of education: Not on file  . Highest education level: Bachelor's degree (e.g., BA, AB, BS)  Occupational History  . Occupation: Retired  Scientific laboratory technician  . Financial resource strain: Not hard at all  . Food insecurity:    Worry: Never true    Inability: Never true  . Transportation needs:    Medical: No    Non-medical: No  Tobacco Use  . Smoking status: Former Smoker    Packs/day: 1.00    Years: 4.00    Pack years: 4.00    Types: Cigarettes    Last attempt to quit: 03/22/1964    Years since quitting: 53.2  . Smokeless tobacco: Never Used  . Tobacco comment: reports smoked socially  Substance and Sexual Activity  . Alcohol use: Yes    Alcohol/week: 21.0 oz    Types: 35 Shots of liquor per week    Comment: 2 mixed drinks daily, some days  . Drug use: No  . Sexual activity: Yes    Partners: Female  Lifestyle  . Physical activity:    Days per week: 4 days    Minutes per session: 40 min  . Stress: Only a little  Relationships  . Social connections:    Talks on phone: More than three times a week    Gets together: More than three times a week    Attends religious service: More than 4 times per year    Active member of club or organization: Not on file    Attends meetings of clubs or organizations: More than 4 times per year    Relationship status: Married  Other Topics Concern  . Not on file  Social History Narrative   Pt is right handed. He drinks coffee 3 x week, and tea 4 x week. He walks occasionally.   Tobacco Counseling Counseling given: Not Answered Comment: reports smoked socially  Activities of Daily Living In your present state of health, do you have any difficulty performing the following activities: 06/21/2017 03/25/2017  Hearing? N N    Vision? N N  Difficulty concentrating or making decisions? N N  Walking or climbing stairs?  N N  Dressing or bathing? N N  Doing errands, shopping? N N  Preparing Food and eating ? N -  Using the Toilet? N -  In the past six months, have you accidently leaked urine? N -  Do you have problems with loss of bowel control? N -  Managing your Medications? N -  Managing your Finances? N -  Housekeeping or managing your Housekeeping? N -  Some recent data might be hidden     Immunizations and Health Maintenance Immunization History  Administered Date(s) Administered  . Hep A / Hep B 08/17/2016  . Hepatitis B, adult 11/21/2016  . Influenza Split 12/22/2010, 12/27/2011, 11/24/2012  . Influenza Whole 11/16/2007, 11/10/2008  . Influenza, High Dose Seasonal PF 11/19/2015, 11/16/2016  . Influenza,inj,Quad PF,6+ Mos 11/18/2013, 12/17/2014  . Influenza-Unspecified 12/03/2015  . Pneumococcal Conjugate-13 01/01/2015  . Pneumococcal Polysaccharide-23 01/25/2004, 11/13/2013  . Td 12/20/2006  . Tdap 10/08/2014   There are no preventive care reminders to display for this patient.  Patient Care Team: Janith Lima, MD as PCP - General (Internal Medicine)  Indicate any recent Medical Services you may have received from other than [facility] providers in the past year (date may be approximate).    Assessment:   This is a routine wellness examination for [place]. Physical assessment deferred to PCP.   Hearing/Vision screen Hearing Screening Comments: Able to hear conversational tones w/o difficulty. No issues reported.  Passed whisper test  Vision Screening Comments: appointment yearly   Dietary issues and exercise activities discussed: Current Exercise Habits: Home exercise routine, Type of exercise: walking, Time (Minutes): 35, Frequency (Times/Week): 4, Weekly Exercise (Minutes/Week): 140, Intensity: Mild, Exercise limited by: orthopedic condition(s)  Diet (meal preparation, eat out, water  intake, caffeinated beverages, dairy products, fruits and vegetables): in general, a "healthy" diet  , well balanced   Reviewed heart healthy and diabetic diet, encouraged patient to increase daily water intake.   Goals    . Patient Stated     Maintain current health status and healthy and as independent as possible. Enjoy life, family and travel      Depression Screen PHQ 2/9 Scores 06/21/2017 04/27/2017 03/27/2017 03/22/2017  PHQ - 2 Score 2 2 4 4   PHQ- 9 Score 7 - 15 17    Fall Risk Fall Risk  06/21/2017 05/25/2017 04/27/2017 11/16/2016 09/16/2015  Falls in the past year? Yes Yes Yes No Yes  Number falls in past yr: 2 or more 2 or more 1 - 1  Injury with Fall? Yes Yes - - -  Risk Factor Category  - High Fall Risk - - -  Risk for fall due to : Impaired balance/gait - - - History of fall(s);Impaired balance/gait  Follow up Falls prevention discussed;Education provided - - - -   Cognitive Function: MMSE - Mini Mental State Exam 08/26/2015 10/06/2014  Orientation to time 5 5  Orientation to Place 5 5  Registration 3 3  Attention/ Calculation 5 5  Recall 2 3  Language- name 2 objects 2 2  Language- repeat 1 1  Language- follow 3 step command 3 3  Language- read & follow direction 1 1  Write a sentence 1 1  Copy design 1 1  Total score 29 30   Montreal Cognitive Assessment  05/25/2017 04/02/2014 10/09/2013  Visuospatial/ Executive (0/5) 2 5 5   Naming (0/3) 3 3 3   Attention: Read list of digits (0/2) 2 2 2   Attention: Read list of letters (0/1) 1 1  1  Attention: Serial 7 subtraction starting at 100 (0/3) 3 3 3   Language: Repeat phrase (0/2) 2 2 2   Language : Fluency (0/1) 1 1 1   Abstraction (0/2) 2 2 2   Delayed Recall (0/5) 0 2 2  Orientation (0/6) 6 6 6   Total 22 27 27   Adjusted Score (based on education) - 27 -      Screening Tests Health Maintenance  Topic Date Due  . INFLUENZA VACCINE  08/24/2017  . HEMOGLOBIN A1C  09/19/2017  . FOOT EXAM  03/22/2018  . URINE MICROALBUMIN   03/22/2018  . OPHTHALMOLOGY EXAM  05/12/2018  . TETANUS/TDAP  10/07/2024  . PNA vac Low Risk Adult  Completed      Plan:    Continue doing brain stimulating activities (puzzles, reading, adult coloring books, staying active) to keep memory sharp.   Continue to eat heart healthy diet (full of fruits, vegetables, whole grains, lean protein, water--limit salt, fat, and sugar intake) and increase physical activity as tolerated.  I have personally reviewed and noted the following in the patient's chart:   . Medical and social history . Use of alcohol, tobacco or illicit drugs  . Current medications and supplements . Functional ability and status . Nutritional status . Physical activity . Advanced directives . List of other physicians . Vitals . Screenings to include cognitive, depression, and falls . Referrals and appointments  In addition, I have reviewed and discussed with patient certain preventive protocols, quality metrics, and best practice recommendations. A written personalized care plan for preventive services as well as general preventive health recommendations were provided to patient.   Medical screening examination/treatment/procedure(s) were performed by non-physician practitioner and as supervising physician I was immediately available for consultation/collaboration. I agree with above. Scarlette Calico, MD   Michiel Cowboy, RN   06/21/2017

## 2017-07-14 ENCOUNTER — Telehealth: Payer: Self-pay | Admitting: Cardiology

## 2017-07-14 NOTE — Telephone Encounter (Signed)
LMOVM requesting that pt send manual transmission b/c home monitor has not updated in at least 7 days.    

## 2017-07-17 ENCOUNTER — Telehealth: Payer: Self-pay

## 2017-07-17 NOTE — Telephone Encounter (Signed)
LMOVM reminding pt to send remote transmission.   

## 2017-07-18 ENCOUNTER — Telehealth: Payer: Self-pay | Admitting: Internal Medicine

## 2017-07-18 NOTE — Telephone Encounter (Signed)
Copied from Emily [id number]. Topic: Quick Communication - See Telephone Encounter >> Jul 18, 2017 12:51 PM Boyd Kerbs wrote: CRM for notification. See Telephone encounter for: 07/18/17.  [facility] - [phone]  needing faxed order for [place]  Fax # [phone]  attn : April

## 2017-07-20 ENCOUNTER — Ambulatory Visit (INDEPENDENT_AMBULATORY_CARE_PROVIDER_SITE_OTHER): Payer: Medicare Other | Admitting: Interventional Cardiology

## 2017-07-20 ENCOUNTER — Telehealth: Payer: Self-pay | Admitting: *Deleted

## 2017-07-20 DIAGNOSIS — I4891 Unspecified atrial fibrillation: Secondary | ICD-10-CM | POA: Diagnosis not present

## 2017-07-20 DIAGNOSIS — I480 Paroxysmal atrial fibrillation: Secondary | ICD-10-CM

## 2017-07-20 DIAGNOSIS — Z5181 Encounter for therapeutic drug level monitoring: Secondary | ICD-10-CM

## 2017-07-20 DIAGNOSIS — Z7901 Long term (current) use of anticoagulants: Secondary | ICD-10-CM | POA: Diagnosis not present

## 2017-07-20 DIAGNOSIS — Z87891 Personal history of nicotine dependence: Secondary | ICD-10-CM | POA: Diagnosis not present

## 2017-07-20 LAB — PROTIME-INR: INR: 2.3 — AB (ref 0.9–1.1)

## 2017-07-20 NOTE — Patient Instructions (Signed)
Description   Spoke with pt and instructed to  continue same dose of Coumadin 1 tablet every day except 2 tablets on Sundays. Recheck in 4 weeks.as he will be home from  [place] Pt's cell phone number is [phone]

## 2017-07-20 NOTE — Telephone Encounter (Addendum)
Called pt at [phone] regarding overdue INR check. Pt was due to have INR checked on 07/05/17 and this has not been done at this time. Pt was given a script in the office to have INR checked on 07/05/17 while he is out of town in [place] for 2 months. Called the number pt instructed [place] to call but it goes to voicemail and the message states voicemail is full, therefore, unable to leave a message.  Tiffany RN called pt on cell phone listed and pt states he will go to have INR drawn today. Will await results.

## 2017-07-24 NOTE — Progress Notes (Signed)
No ICM remote transmission received for 07/17/2017 and next ICM transmission scheduled for 08/08/2017.

## 2017-07-28 ENCOUNTER — Telehealth: Payer: Self-pay | Admitting: Cardiology

## 2017-07-28 NOTE — Telephone Encounter (Signed)
LMOVM requesting that pt send manual transmission b/c home monitor has not updated in at least 14 days.    

## 2017-08-08 ENCOUNTER — Telehealth: Payer: Self-pay

## 2017-08-08 NOTE — Telephone Encounter (Signed)
I spoke with the patient about this. He will be on vacation until the end of July. He will send one when he returns

## 2017-08-11 ENCOUNTER — Telehealth: Payer: Self-pay

## 2017-08-11 NOTE — Progress Notes (Signed)
No ICM remote transmission received for 08/08/2017 and next ICM transmission scheduled for 08/31/2017.

## 2017-08-11 NOTE — Telephone Encounter (Signed)
LMOVM reminding pt to send remote transmission.   

## 2017-08-18 ENCOUNTER — Telehealth: Payer: Self-pay | Admitting: Cardiology

## 2017-08-18 NOTE — Telephone Encounter (Signed)
LMOVM requesting that pt send manual transmission b/c home monitor has not updated in at least 7 days.    

## 2017-08-22 ENCOUNTER — Ambulatory Visit (INDEPENDENT_AMBULATORY_CARE_PROVIDER_SITE_OTHER): Payer: Medicare Other | Admitting: Pharmacist

## 2017-08-22 DIAGNOSIS — I4891 Unspecified atrial fibrillation: Secondary | ICD-10-CM | POA: Diagnosis not present

## 2017-08-22 DIAGNOSIS — Z5181 Encounter for therapeutic drug level monitoring: Secondary | ICD-10-CM | POA: Diagnosis not present

## 2017-08-22 LAB — POCT INR: INR: 2.1 (ref 2.0–3.0)

## 2017-08-22 NOTE — Patient Instructions (Signed)
Description   Continue same dose of Coumadin 1 tablet every day except 2 tablets on Sundays. Recheck in 4 weeks.

## 2017-08-24 ENCOUNTER — Encounter: Payer: Medicare Other | Admitting: Psychology

## 2017-08-25 ENCOUNTER — Encounter

## 2017-08-25 ENCOUNTER — Ambulatory Visit (INDEPENDENT_AMBULATORY_CARE_PROVIDER_SITE_OTHER): Payer: Medicare Other | Admitting: Neurology

## 2017-08-25 ENCOUNTER — Encounter: Payer: Self-pay | Admitting: Neurology

## 2017-08-25 VITALS — BP 126/72 | HR 70 | Ht 72.0 in | Wt 195.0 lb

## 2017-08-25 DIAGNOSIS — F32A Depression, unspecified: Secondary | ICD-10-CM

## 2017-08-25 DIAGNOSIS — F329 Major depressive disorder, single episode, unspecified: Secondary | ICD-10-CM | POA: Diagnosis not present

## 2017-08-25 DIAGNOSIS — F1027 Alcohol dependence with alcohol-induced persisting dementia: Secondary | ICD-10-CM

## 2017-08-25 NOTE — Patient Instructions (Signed)
1.  Follow up with Dr. Si Raider.  I will see you after testing is completed.

## 2017-08-25 NOTE — Progress Notes (Signed)
NEUROLOGY FOLLOW UP OFFICE NOTE  Corey [name] [id number]  HISTORY OF PRESENT ILLNESS: Corey [name] is a 82 year old right-handed man with history of paroxysmal atrial fibrillation with PM, MI and CAD, TIA, hypertension, OSA, hyperlipidemia, alcohol abuse, and history of prostate cancer who follows up for cognitive impairment.  He is accompanied by his wife who supplements history.   UPDATE: He is scheduled to have repeat neuropsychological testing in October.  He continues to drink.  His wife wanted to bring to my attention some recent developments.  They typically set up his medications in a pillbox for the coming month.  He recently said he no longer wants her setting it up because she has made mistakes.  He also went to the bank and withdrew $6000 without telling her in order to gamble at a casino when the visited [place].  He had left the money sitting on the front passenger seat.  He has had two episodes of bowel incontinence.  He is irritable towards his wife.     HISTORY: His wife and daughters note some mild memory problems for several years, but has significantly been more noticeable over the past 5 months, since he retired in June 2016.  Memory has been the primary problem.  He was called his daughters several times in a day asking about the same thing.  He still drives but is now mildly disoriented driving around home.  There has been no accidents or near-accidents, but he is more irritable and impulsive, and will drive a little faster and tail gate.  He has poor night vision.  They have a new grandson.  Recently, he forgot about a party for his new grandchild and instead made plans with friends.  He has begun to forget names of family and friends.  He has not had any significant change in personality other than irritability.  Besides the aggressive driving, he uses foul language a little more.  He does not act inappropriately.  He does feel depressed, as he has been going through  an adjustment since retiring.  He has had more falls recently.  One time, the rug slipped from underneath him.  Another time, he tripped over a hose at the gas station.  He also had experienced bowel incontinence.  He has not had any problems at work prior to retirement.  He has not had hallucinations or delusions. He has not had difficulty performing everyday tasks.  He notes some problems but doesn't think it is as bad as his family makes it out to be.     He worked in [employer] for many years.  Since retirement, he typically doesn't do too much.  He will sit around.  He doesn't watch much TV during the day.  He reads the newspaper daily and able to retain information.  He is able to stay focus.  He and his wife are not as social as they used to be.     His mother had dementia.    Over the past several years, he has had several operations, including pacemaker and back surgery.  He attributes these changes to his multiple surgeries.  He also has depression.  He has been depressed for several years, triggered by several events in his life, such as pain due to multiple back surgeries and the passing of his daughter.  It has worsened particularly since 2015.  He and his wife traveled around the country but he didn't quite seem to enjoy it as much  as she should.  He doesn't have any desire to go out and be active or social.  His diet was poor, and he was prone to skipping meals.  His sleep was poor.  He became more irritable.   He did not tolerate Aricept due to vivid dreams and he was switched to Exelon.  CT of head from 10/05/14 showed generalized atrophy and chronic small vessel ischemic changes.  He underwent neuropsychological testing with Dr. Si Raider on 09/04/15.  Results were largely within normal limits and not consistent with dementia, however findings suggested non-amnestic mild cognitive impairment as demonstrated by some deficits in visual-spatial construction and language and immediate encoding.  It was  believed that his depression, psychosocial stressors and alcohol consumption played a large role in cognitive difficulties.  As his mild cognitive impairment may have been more affected by depression and alcohol use, Exelon was discontinued.  He was started on Zoloft.  03/22/17 LABS:  B12 463, folate over 23.6, B1 11, TSH 4.15 04/21/17:  CT head showed chronic small vessel disease with chronic left MCA infarct  PAST MEDICAL HISTORY: Past Medical History:  Diagnosis Date  . Alcohol abuse, episodic 05/19/2008  . ALLERGIC RHINITIS 04/15/2009  . Allergy    YEAR ROUND,TAKE SHOTS  . Alzheimer's dementia    beginning with sx  . Anxiety   . Arthritis   . ASTHMA 11/16/2006   sinusitis-    hx ?yeast patch/white patch on vocal cord as per [facility] 02/25/11-   . Atrial fibrillation ([place]) 11/16/2006   remote CHF related to atrial fib with rapid ventricular response over 25 yrs per office note,  . Cancer [facility]) 2002   prostate cancer  . Chronic systolic CHF (congestive heart failure) (HCC)    echo (10/02/12): EF 47-09%, grade 1 diastolic dysfunction, trivial AI, mild MR, mild RVE  . COLONIC POLYPS, ADENOMATOUS, HX OF 02/14/2008  . CORONARY ARTERY DISEASE 11/16/2006   LHC (07/2011): LAD with luminal irregularities, proximal circumflex 50%, EF 25%  . Depression   . DIVERTICULOSIS, COLON 02/14/2008  . ESOPHAGEAL STRICTURE 01/28/2008  . GERD 02/14/2008  . Headache(784.0)   . HYPERGLYCEMIA 11/16/2006  . HYPERLIPIDEMIA 02/14/2008  . HYPERTENSION 11/16/2006  . ICD (implantable cardiac defibrillator) in place   . INSOMNIA-SLEEP DISORDER-UNSPEC 02/11/2009  . Lesion of vocal cord    bx begine  . Myocardial infarction ([place]) 1993  . NICM (nonischemic cardiomyopathy) ([place])   . OBSTRUCTIVE SLEEP APNEA 11/10/2008  . 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 Pacemaker    St. Jude  . Pneumonia    hx of  . PROSTATE CANCER, HX OF 02/25/2000  . SLEEP APNEA 10/03/2009   LOV Dr [name] 12/12 in EPIC    Moderate per patient-  settings ?6/last sleep study years ago  . Sleep apnea   . Stroke ([place])    Tia  . Symptomatic bradycardia, secondary to sinus node dysfunction 07/08/2011   s/p [facility] [employer] PPM by Dr Sallyanne Kuster, upgraded to [place] ICD 12/13 by Dr Rayann Heman ([facility])  . TRANSIENT ISCHEMIC ATTACKS, HX OF 11/16/2006  . Type 2 diabetes mellitus with complication, without long-term current use of insulin ([place]) 11/19/2016    MEDICATIONS: Current Outpatient Medications on File Prior to Visit  Medication Sig Dispense Refill  . albuterol (ACCUNEB) 0.63 MG/3ML nebulizer solution Inhale into the lungs.    Marland Kitchen albuterol (PROVENTIL HFA;VENTOLIN HFA) 108 (90 BASE) MCG/ACT inhaler Inhale 1-2 puffs into the lungs every 6 (six) hours as needed for shortness of breath. 1 Inhaler 11  .  amiodarone (PACERONE) 200 MG tablet TAKE ONE-HALF TABLET DAILY 45 tablet 3  . folic acid (FOLVITE) 1 MG tablet TAKE ONE TABLET EACH DAY 90 tablet 1  . levalbuterol (XOPENEX) 1.25 MG/3ML nebulizer solution Inhale into the lungs as needed.     Marland Kitchen levothyroxine (SYNTHROID, LEVOTHROID) 125 MCG tablet TAKE ONE TABLET EACH DAY 90 tablet 1  . metoprolol succinate (TOPROL-XL) 50 MG 24 hr tablet Take one-half tablet by mouth every evening 15 tablet 5  . mometasone (NASONEX) 50 MCG/ACT nasal spray 1-2 puffs each nostril once or twice daily if needed (Patient taking differently: as needed. 1-2 puffs each nostril once or twice daily if needed) 17 g 0  . pantoprazole (PROTONIX) 40 MG tablet TAKE ONE TABLET EACH DAY 90 tablet 0  . rosuvastatin (CRESTOR) 5 MG tablet TAKE ONE TABLET DAILY AT 6PM 90 tablet 1  . sertraline (ZOLOFT) 100 MG tablet TAKE ONE TABLET EACH DAY 90 tablet 1  . spironolactone (ALDACTONE) 25 MG tablet TAKE ONE TABLET BY MOUTH ONCE DAILY 30 tablet 8  . thiamine (VITAMIN B-1) 100 MG tablet Take 1 tablet (100 mg total) by mouth daily. 90 tablet 1  . warfarin (COUMADIN) 2.5 MG tablet TAKE AS DIRECTED BY COUMADIN CLINIC 40 tablet 1   No current  facility-administered medications on file prior to visit.     ALLERGIES: Allergies  Allergen Reactions  . Altace [Ramipril] Cough  . Hydrocodone Other (See Comments)    Severe panic attacks  . Oxycodone Other (See Comments)    Severe panic attacks    FAMILY HISTORY: Family History  Problem Relation Age of Onset  . Heart attack Father   . Heart attack Brother   . Heart disease Maternal Uncle   . Heart attack Maternal Uncle   . Sudden death Daughter 59       unknown  . Autism Son   . Colon cancer Neg Hx   . Esophageal cancer Neg Hx   . Rectal cancer Neg Hx   . Prostate cancer Neg Hx   . Stomach cancer Neg Hx     SOCIAL HISTORY: Social History   Socioeconomic History  . Marital status: Married    Spouse name: [name]  . Number of children: 4  . Years of education: Not on file  . Highest education level: Bachelor's degree (e.g., BA, AB, BS)  Occupational History  . Occupation: Retired  Scientific laboratory technician  . Financial resource strain: Not hard at all  . Food insecurity:    Worry: Never true    Inability: Never true  . Transportation needs:    Medical: No    Non-medical: No  Tobacco Use  . Smoking status: Former Smoker    Packs/day: 1.00    Years: 4.00    Pack years: 4.00    Types: Cigarettes    Last attempt to quit: 03/22/1964    Years since quitting: 53.4  . Smokeless tobacco: Never Used  . Tobacco comment: reports smoked socially  Substance and Sexual Activity  . Alcohol use: Yes    Alcohol/week: 21.0 oz    Types: 35 Shots of liquor per week    Comment: 2 mixed drinks daily, some days  . Drug use: No  . Sexual activity: Yes    Partners: Female  Lifestyle  . Physical activity:    Days per week: 4 days    Minutes per session: 40 min  . Stress: Only a little  Relationships  . Social connections:  Talks on phone: More than three times a week    Gets together: More than three times a week    Attends religious service: More than 4 times per year    Active  member of club or organization: Not on file    Attends meetings of clubs or organizations: More than 4 times per year    Relationship status: Married  . Intimate partner violence:    Fear of current or ex partner: No    Emotionally abused: No    Physically abused: Not on file    Forced sexual activity: No  Other Topics Concern  . Not on file  Social History Narrative   Pt is right handed. He drinks coffee 3 x week, and tea 4 x week. He walks occasionally.    REVIEW OF SYSTEMS: Constitutional: No fevers, chills, or sweats, no generalized fatigue, change in appetite Eyes: No visual changes, double vision, eye pain Ear, nose and throat: No hearing loss, ear pain, nasal congestion, sore throat Cardiovascular: No chest pain, palpitations Respiratory:  No shortness of breath at rest or with exertion, wheezes GastrointestinaI: No nausea, vomiting, diarrhea, abdominal pain, fecal incontinence Genitourinary:  No dysuria, urinary retention or frequency Musculoskeletal:  No neck pain, back pain Integumentary: No rash, pruritus, skin lesions Neurological: as above Psychiatric: No depression, insomnia, anxiety Endocrine: No palpitations, fatigue, diaphoresis, mood swings, change in appetite, change in weight, increased thirst Hematologic/Lymphatic:  No purpura, petechiae. Allergic/Immunologic: no itchy/runny eyes, nasal congestion, recent allergic reactions, rashes  PHYSICAL EXAM: Vitals:   08/25/17 0857  BP: 126/72  Pulse: 70  SpO2: 96%   General: No acute distress.  Patient appears well-groomed.    IMPRESSION: Dementia, likely secondary to alcohol abuse. Depression  PLAN: 1.  I recommended supervision 2.  Follow up for repeat neuropsychological testing, which would be most helpful in determining further recommendations 3.  Follow up after repeat testing  25 minutes spent face to face with patient, 100% spent discussing management.  Metta Clines, DO  CC:  Dr. Ronnald Ramp

## 2017-08-28 DIAGNOSIS — H01001 Unspecified blepharitis right upper eyelid: Secondary | ICD-10-CM | POA: Diagnosis not present

## 2017-08-29 DIAGNOSIS — F4323 Adjustment disorder with mixed anxiety and depressed mood: Secondary | ICD-10-CM | POA: Diagnosis not present

## 2017-08-31 ENCOUNTER — Other Ambulatory Visit (INDEPENDENT_AMBULATORY_CARE_PROVIDER_SITE_OTHER): Payer: Medicare Other

## 2017-08-31 ENCOUNTER — Ambulatory Visit (INDEPENDENT_AMBULATORY_CARE_PROVIDER_SITE_OTHER): Payer: Medicare Other

## 2017-08-31 ENCOUNTER — Telehealth: Payer: Self-pay

## 2017-08-31 DIAGNOSIS — I5022 Chronic systolic (congestive) heart failure: Secondary | ICD-10-CM | POA: Diagnosis not present

## 2017-08-31 DIAGNOSIS — K709 Alcoholic liver disease, unspecified: Secondary | ICD-10-CM | POA: Diagnosis not present

## 2017-08-31 DIAGNOSIS — Z95810 Presence of automatic (implantable) cardiac defibrillator: Secondary | ICD-10-CM | POA: Diagnosis not present

## 2017-08-31 LAB — HEPATIC FUNCTION PANEL
ALT: 48 U/L (ref 0–53)
AST: 55 U/L — ABNORMAL HIGH (ref 0–37)
Albumin: 4.2 g/dL (ref 3.5–5.2)
Alkaline Phosphatase: 111 U/L (ref 39–117)
BILIRUBIN TOTAL: 0.4 mg/dL (ref 0.2–1.2)
Bilirubin, Direct: 0.1 mg/dL (ref 0.0–0.3)
Total Protein: 7.8 g/dL (ref 6.0–8.3)

## 2017-08-31 NOTE — Progress Notes (Signed)
EPIC Encounter for ICM Monitoring  Patient Name: QUARON DELACRUZ is a 82 y.o. male Date: 08/31/2017 Primary Care Physican: Janith Lima, MD Primary Cardiologist:Lori Servando Snare, NP Electrophysiologist: Allred Dry Weight:unknown Bi-V Pacing: 95%      Attempted call to patient and unable to reach.  Left detailed message, per DPR, regarding transmission.  Transmission reviewed.    Thoracic impedance normal but was abnormal suggesting fluid accumulation from 08/13/2017 - 08/23/2017.  Recommendations: Left voice mail with ICM number and encouraged to call if experiencing any fluid symptoms.  Follow-up plan: ICM clinic phone appointment on 10/02/2017.    Copy of ICM check sent to Dr. Rayann Heman.   3 month ICM trend: 08/31/2017    1 Year ICM trend:       Rosalene Billings, RN 08/31/2017 4:38 PM

## 2017-08-31 NOTE — Telephone Encounter (Signed)
Spoke with pt and reminded pt of remote transmission that is due today. Pt verbalized understanding.   

## 2017-08-31 NOTE — Telephone Encounter (Signed)
Remote ICM transmission received.  Attempted call to patient and left detailed message, per DPR, regarding transmission and next ICM scheduled for 10/02/2017.  Advised to return call for any fluid symptoms or questions.

## 2017-09-04 ENCOUNTER — Other Ambulatory Visit: Payer: Self-pay | Admitting: Internal Medicine

## 2017-09-12 ENCOUNTER — Encounter: Payer: Medicare Other | Admitting: *Deleted

## 2017-09-12 ENCOUNTER — Telehealth: Payer: Self-pay | Admitting: Cardiology

## 2017-09-12 NOTE — Telephone Encounter (Signed)
Attempted to confirm remote transmission with pt. No answer and was unable to leave a message.   

## 2017-09-13 ENCOUNTER — Encounter: Payer: Self-pay | Admitting: Cardiology

## 2017-09-14 ENCOUNTER — Other Ambulatory Visit: Payer: Medicare Other

## 2017-09-14 DIAGNOSIS — C61 Malignant neoplasm of prostate: Secondary | ICD-10-CM | POA: Diagnosis not present

## 2017-09-18 DIAGNOSIS — C61 Malignant neoplasm of prostate: Secondary | ICD-10-CM | POA: Diagnosis not present

## 2017-09-20 ENCOUNTER — Ambulatory Visit (INDEPENDENT_AMBULATORY_CARE_PROVIDER_SITE_OTHER): Payer: Medicare Other | Admitting: Pharmacist

## 2017-09-20 DIAGNOSIS — I4891 Unspecified atrial fibrillation: Secondary | ICD-10-CM

## 2017-09-20 DIAGNOSIS — Z5181 Encounter for therapeutic drug level monitoring: Secondary | ICD-10-CM

## 2017-09-20 LAB — POCT INR: INR: 1.9 — AB (ref 2.0–3.0)

## 2017-09-20 NOTE — Patient Instructions (Signed)
Description   Tomorrow take 1.5 tablets of Coumadin then continue same dose of Coumadin 1 tablet every day except 2 tablets on Sundays. Recheck in 4 weeks.

## 2017-09-28 DIAGNOSIS — H0011 Chalazion right upper eyelid: Secondary | ICD-10-CM | POA: Diagnosis not present

## 2017-10-02 ENCOUNTER — Telehealth: Payer: Self-pay

## 2017-10-02 NOTE — Telephone Encounter (Signed)
LMOVM reminding pt to send remote transmission.   

## 2017-10-09 ENCOUNTER — Other Ambulatory Visit: Payer: Self-pay | Admitting: Gastroenterology

## 2017-10-09 ENCOUNTER — Other Ambulatory Visit: Payer: Self-pay | Admitting: Internal Medicine

## 2017-10-09 DIAGNOSIS — R270 Ataxia, unspecified: Secondary | ICD-10-CM

## 2017-10-09 DIAGNOSIS — H0012 Chalazion right lower eyelid: Secondary | ICD-10-CM | POA: Diagnosis not present

## 2017-10-09 DIAGNOSIS — D510 Vitamin B12 deficiency anemia due to intrinsic factor deficiency: Secondary | ICD-10-CM

## 2017-10-09 DIAGNOSIS — H0011 Chalazion right upper eyelid: Secondary | ICD-10-CM | POA: Diagnosis not present

## 2017-10-09 DIAGNOSIS — E032 Hypothyroidism due to medicaments and other exogenous substances: Secondary | ICD-10-CM

## 2017-10-10 ENCOUNTER — Telehealth: Payer: Self-pay | Admitting: Cardiology

## 2017-10-10 NOTE — Progress Notes (Signed)
No ICM remote transmission received for 10/02/2017 and next ICM transmission scheduled for 10/26/2017.

## 2017-10-10 NOTE — Telephone Encounter (Signed)
LMOVM requesting that pt send manual transmission b/c home monitor has not updated in at least 14 days.    

## 2017-10-16 ENCOUNTER — Ambulatory Visit (INDEPENDENT_AMBULATORY_CARE_PROVIDER_SITE_OTHER): Payer: Medicare Other | Admitting: Pharmacist

## 2017-10-16 ENCOUNTER — Other Ambulatory Visit: Payer: Self-pay | Admitting: Internal Medicine

## 2017-10-16 DIAGNOSIS — I4891 Unspecified atrial fibrillation: Secondary | ICD-10-CM | POA: Diagnosis not present

## 2017-10-16 DIAGNOSIS — Z5181 Encounter for therapeutic drug level monitoring: Secondary | ICD-10-CM | POA: Diagnosis not present

## 2017-10-16 LAB — POCT INR: INR: 1.8 — AB (ref 2.0–3.0)

## 2017-10-16 NOTE — Patient Instructions (Signed)
Description   Take 2 tablets today and 1.5 tablets tomorrow, then continue same dose of Coumadin 1 tablet every day except 2 tablets on Sundays. Recheck in 3 weeks.

## 2017-10-20 ENCOUNTER — Telehealth: Payer: Self-pay

## 2017-10-20 NOTE — Telephone Encounter (Signed)
LMOVM requesting that pt send manual transmission b/c home monitor has not updated in at least 7 days.    

## 2017-10-23 ENCOUNTER — Other Ambulatory Visit: Payer: Self-pay | Admitting: Internal Medicine

## 2017-10-26 ENCOUNTER — Encounter: Payer: Medicare Other | Admitting: Psychology

## 2017-10-26 ENCOUNTER — Encounter

## 2017-10-26 ENCOUNTER — Telehealth: Payer: Self-pay | Admitting: Cardiology

## 2017-10-26 NOTE — Telephone Encounter (Signed)
Attempted to confirm remote transmission with pt. No answer and was unable to leave a message.   

## 2017-10-28 ENCOUNTER — Telehealth: Payer: Self-pay | Admitting: Internal Medicine

## 2017-10-28 NOTE — Telephone Encounter (Signed)
Pt is needing a refill for rosuvastatin (CRESTOR) 5 MG tablet [[id number]]   but was told he needed an appointment first.  I scheduled him for 10/30.   He is leaving out of town Monday afternoon for two weeks.   Can you please fill prescription to [facility] Monday am  Thank you

## 2017-10-29 ENCOUNTER — Other Ambulatory Visit: Payer: Self-pay | Admitting: Internal Medicine

## 2017-10-29 DIAGNOSIS — E118 Type 2 diabetes mellitus with unspecified complications: Secondary | ICD-10-CM

## 2017-10-29 DIAGNOSIS — E785 Hyperlipidemia, unspecified: Secondary | ICD-10-CM

## 2017-10-29 DIAGNOSIS — I2510 Atherosclerotic heart disease of native coronary artery without angina pectoris: Secondary | ICD-10-CM

## 2017-10-29 MED ORDER — ROSUVASTATIN CALCIUM 5 MG PO TABS: 5.0000 mg | ORAL_TABLET | Freq: Every day | ORAL | 1 refills | Status: DC

## 2017-10-30 ENCOUNTER — Ambulatory Visit (INDEPENDENT_AMBULATORY_CARE_PROVIDER_SITE_OTHER): Payer: Medicare Other | Admitting: Psychology

## 2017-10-30 ENCOUNTER — Encounter: Payer: Self-pay | Admitting: Psychology

## 2017-10-30 ENCOUNTER — Ambulatory Visit: Payer: Medicare Other | Admitting: Psychology

## 2017-10-30 DIAGNOSIS — D0439 Carcinoma in situ of skin of other parts of face: Secondary | ICD-10-CM | POA: Diagnosis not present

## 2017-10-30 DIAGNOSIS — R413 Other amnesia: Secondary | ICD-10-CM

## 2017-10-30 DIAGNOSIS — L570 Actinic keratosis: Secondary | ICD-10-CM | POA: Diagnosis not present

## 2017-10-30 DIAGNOSIS — F329 Major depressive disorder, single episode, unspecified: Secondary | ICD-10-CM

## 2017-10-30 DIAGNOSIS — Z85828 Personal history of other malignant neoplasm of skin: Secondary | ICD-10-CM | POA: Diagnosis not present

## 2017-10-30 DIAGNOSIS — L853 Xerosis cutis: Secondary | ICD-10-CM | POA: Diagnosis not present

## 2017-10-30 DIAGNOSIS — D1801 Hemangioma of skin and subcutaneous tissue: Secondary | ICD-10-CM | POA: Diagnosis not present

## 2017-10-30 DIAGNOSIS — F1010 Alcohol abuse, uncomplicated: Secondary | ICD-10-CM

## 2017-10-30 DIAGNOSIS — L821 Other seborrheic keratosis: Secondary | ICD-10-CM | POA: Diagnosis not present

## 2017-10-30 DIAGNOSIS — G3184 Mild cognitive impairment, so stated: Secondary | ICD-10-CM

## 2017-10-30 DIAGNOSIS — D045 Carcinoma in situ of skin of trunk: Secondary | ICD-10-CM | POA: Diagnosis not present

## 2017-10-30 DIAGNOSIS — F32A Depression, unspecified: Secondary | ICD-10-CM

## 2017-10-30 DIAGNOSIS — D225 Melanocytic nevi of trunk: Secondary | ICD-10-CM | POA: Diagnosis not present

## 2017-10-30 NOTE — Progress Notes (Signed)
   Neuropsychology Note  Corey [name] completed 75 minutes of neuropsychological testing with technician, Milana Kidney, BS, under the supervision of Dr. Macarthur Critchley, Licensed Psychologist. The patient did not appear overtly distressed by the testing session, per behavioral observation or via self-report to the technician. Rest breaks were offered.   Clinical Decision Making: In considering the patient's current level of functioning, level of presumed impairment, nature of symptoms, emotional and behavioral responses during the interview, level of literacy, and observed level of motivation/effort, a battery of tests was selected and communicated to the psychometrician.  Communication between the psychologist and technician was ongoing throughout the testing session and changes were made as deemed necessary based on patient performance on testing, technician observations and additional pertinent factors such as those listed above.  Corey [name] will return within approximately 2 weeks for an interactive feedback session with Dr. Si Raider at which time his test performances, clinical impressions and treatment recommendations will be reviewed in detail. The patient understands he can contact our office should he require our assistance before this time.  35 minutes spent performing neuropsychological evaluation services/clinical decision making (psychologist). [CPT 32549] 75 minutes spent face-to-face with patient administering standardized tests, 30 minutes spent scoring (technician). [CPT [id number], 82641]  Full report to follow.
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
## 2017-11-06 ENCOUNTER — Telehealth: Payer: Self-pay | Admitting: Cardiology

## 2017-11-06 NOTE — Telephone Encounter (Signed)
LMOVM requesting that pt send manual transmission b/c home monitor has not updated in at least 7 days.    

## 2017-11-06 NOTE — Progress Notes (Signed)
No ICM remote transmission received for 10/26/2017 and next ICM transmission scheduled for 11/16/2017.

## 2017-11-15 ENCOUNTER — Ambulatory Visit: Payer: Medicare Other | Admitting: Gastroenterology

## 2017-11-16 ENCOUNTER — Encounter: Payer: Medicare Other | Admitting: *Deleted

## 2017-11-16 ENCOUNTER — Telehealth: Payer: Self-pay

## 2017-11-16 NOTE — Telephone Encounter (Signed)
LMOVM reminding pt to send remote transmission.   
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
## 2017-11-17 ENCOUNTER — Encounter: Payer: Self-pay | Admitting: Cardiology

## 2017-11-17 ENCOUNTER — Telehealth: Payer: Self-pay

## 2017-11-17 NOTE — Progress Notes (Signed)
No ICM remote transmission received for 11/16/2017 and next ICM transmission scheduled for 12/14/2017.

## 2017-11-17 NOTE — Telephone Encounter (Signed)
Spoke w/ pt and requested that he send a manual transmission b/c his home monitor has not updated in at least 14 days.   

## 2017-11-20 ENCOUNTER — Encounter: Payer: Self-pay | Admitting: Psychology

## 2017-11-20 ENCOUNTER — Ambulatory Visit (INDEPENDENT_AMBULATORY_CARE_PROVIDER_SITE_OTHER): Payer: Medicare Other | Admitting: Psychology

## 2017-11-20 ENCOUNTER — Other Ambulatory Visit: Payer: Self-pay | Admitting: Internal Medicine

## 2017-11-20 DIAGNOSIS — G3184 Mild cognitive impairment, so stated: Secondary | ICD-10-CM

## 2017-11-20 DIAGNOSIS — F321 Major depressive disorder, single episode, moderate: Secondary | ICD-10-CM

## 2017-11-20 DIAGNOSIS — F1010 Alcohol abuse, uncomplicated: Secondary | ICD-10-CM

## 2017-11-20 DIAGNOSIS — F418 Other specified anxiety disorders: Secondary | ICD-10-CM

## 2017-11-21 ENCOUNTER — Encounter

## 2017-11-21 ENCOUNTER — Encounter: Payer: Medicare Other | Admitting: Psychology

## 2017-11-22 ENCOUNTER — Encounter: Payer: Self-pay | Admitting: Internal Medicine

## 2017-11-22 ENCOUNTER — Ambulatory Visit (INDEPENDENT_AMBULATORY_CARE_PROVIDER_SITE_OTHER): Payer: Medicare Other | Admitting: Internal Medicine

## 2017-11-22 ENCOUNTER — Other Ambulatory Visit (INDEPENDENT_AMBULATORY_CARE_PROVIDER_SITE_OTHER): Payer: Medicare Other

## 2017-11-22 VITALS — BP 138/80 | HR 68 | Temp 97.8°F | Resp 16 | Ht 72.0 in | Wt 190.0 lb

## 2017-11-22 DIAGNOSIS — I10 Essential (primary) hypertension: Secondary | ICD-10-CM | POA: Diagnosis not present

## 2017-11-22 DIAGNOSIS — D510 Vitamin B12 deficiency anemia due to intrinsic factor deficiency: Secondary | ICD-10-CM

## 2017-11-22 DIAGNOSIS — E118 Type 2 diabetes mellitus with unspecified complications: Secondary | ICD-10-CM

## 2017-11-22 DIAGNOSIS — E785 Hyperlipidemia, unspecified: Secondary | ICD-10-CM

## 2017-11-22 DIAGNOSIS — Z23 Encounter for immunization: Secondary | ICD-10-CM | POA: Diagnosis not present

## 2017-11-22 DIAGNOSIS — E032 Hypothyroidism due to medicaments and other exogenous substances: Secondary | ICD-10-CM

## 2017-11-22 DIAGNOSIS — I2510 Atherosclerotic heart disease of native coronary artery without angina pectoris: Secondary | ICD-10-CM | POA: Diagnosis not present

## 2017-11-22 DIAGNOSIS — D508 Other iron deficiency anemias: Secondary | ICD-10-CM

## 2017-11-22 LAB — CBC WITH DIFFERENTIAL/PLATELET
BASOS PCT: 1.4 % (ref 0.0–3.0)
Basophils Absolute: 0.1 10*3/uL (ref 0.0–0.1)
EOS PCT: 3.1 % (ref 0.0–5.0)
Eosinophils Absolute: 0.2 10*3/uL (ref 0.0–0.7)
HCT: 41.2 % (ref 39.0–52.0)
Hemoglobin: 14 g/dL (ref 13.0–17.0)
LYMPHS ABS: 1.6 10*3/uL (ref 0.7–4.0)
Lymphocytes Relative: 32.4 % (ref 12.0–46.0)
MCHC: 33.9 g/dL (ref 30.0–36.0)
MCV: 91 fl (ref 78.0–100.0)
Monocytes Absolute: 0.4 10*3/uL (ref 0.1–1.0)
Monocytes Relative: 7.2 % (ref 3.0–12.0)
NEUTROS ABS: 2.8 10*3/uL (ref 1.4–7.7)
NEUTROS PCT: 55.9 % (ref 43.0–77.0)
PLATELETS: 159 10*3/uL (ref 150.0–400.0)
RBC: 4.53 Mil/uL (ref 4.22–5.81)
RDW: 16.3 % — AB (ref 11.5–15.5)
WBC: 5.1 10*3/uL (ref 4.0–10.5)

## 2017-11-22 LAB — URINALYSIS, ROUTINE W REFLEX MICROSCOPIC
BILIRUBIN URINE: NEGATIVE
HGB URINE DIPSTICK: NEGATIVE
Ketones, ur: NEGATIVE
Leukocytes, UA: NEGATIVE
NITRITE: NEGATIVE
RBC / HPF: NONE SEEN (ref 0–?)
Specific Gravity, Urine: 1.02 (ref 1.000–1.030)
TOTAL PROTEIN, URINE-UPE24: NEGATIVE
Urine Glucose: >=1000 — AB
Urobilinogen, UA: 0.2 (ref 0.0–1.0)
pH: 5.5 (ref 5.0–8.0)

## 2017-11-22 LAB — COMPREHENSIVE METABOLIC PANEL
ALBUMIN: 4.4 g/dL (ref 3.5–5.2)
ALT: 41 U/L (ref 0–53)
AST: 59 U/L — AB (ref 0–37)
Alkaline Phosphatase: 105 U/L (ref 39–117)
BILIRUBIN TOTAL: 0.5 mg/dL (ref 0.2–1.2)
BUN: 18 mg/dL (ref 6–23)
CHLORIDE: 104 meq/L (ref 96–112)
CO2: 27 meq/L (ref 19–32)
CREATININE: 1.48 mg/dL (ref 0.40–1.50)
Calcium: 9.4 mg/dL (ref 8.4–10.5)
GFR: 48.61 mL/min — ABNORMAL LOW (ref >60.00)
Glucose, Bld: 140 mg/dL — ABNORMAL HIGH (ref 70–99)
Potassium: 4.7 mEq/L (ref 3.5–5.1)
SODIUM: 139 meq/L (ref 135–145)
Total Protein: 7.5 g/dL (ref 6.0–8.3)

## 2017-11-22 LAB — LIPID PANEL
Cholesterol: 162 mg/dL (ref 0–200)
HDL: 54.5 mg/dL (ref >39.00)
LDL Cholesterol: 79 mg/dL (ref 0–99)
NonHDL: 107.82
TRIGLYCERIDES: 143 mg/dL (ref 0.0–149.0)
Total CHOL/HDL Ratio: 3
VLDL: 28.6 mg/dL (ref 0.0–40.0)

## 2017-11-22 LAB — TSH: TSH: 0.33 u[IU]/mL — AB (ref 0.35–4.50)

## 2017-11-22 LAB — HEMOGLOBIN A1C: HEMOGLOBIN A1C: 6.3 % (ref 4.6–6.5)

## 2017-11-22 NOTE — Progress Notes (Signed)
Subjective:  Patient ID: Corey [name], male    DOB: [birth year]  Age: 82 y.o. MRN: [id number]  CC: Anemia; Hypothyroidism; Hypertension; and Hyperlipidemia   HPI Corey [name] presents for f/up - He continues to drink alcohol and continues with a slow neurological decline.  He iha recently seen a psychologist and does not like her recommendations.  He is not willing to quit drinking.  He otherwise feels well and offers no complaints.  He denies any recent episodes of CP, DOE, palpitations, edema, or fatigue.  Outpatient Medications Prior to Visit  Medication Sig Dispense Refill  . albuterol (ACCUNEB) 0.63 MG/3ML nebulizer solution Inhale into the lungs.    Marland Kitchen albuterol (PROVENTIL HFA;VENTOLIN HFA) 108 (90 BASE) MCG/ACT inhaler Inhale 1-2 puffs into the lungs every 6 (six) hours as needed for shortness of breath. 1 Inhaler 11  . amiodarone (PACERONE) 200 MG tablet TAKE ONE-HALF TABLET DAILY 45 tablet 3  . folic acid (FOLVITE) 1 MG tablet TAKE ONE TABLET EACH DAY 90 tablet 1  . levalbuterol (XOPENEX) 1.25 MG/3ML nebulizer solution Inhale into the lungs as needed.     Marland Kitchen levothyroxine (SYNTHROID, LEVOTHROID) 125 MCG tablet TAKE ONE TABLET DAILY 90 tablet 1  . metoprolol succinate (TOPROL-XL) 50 MG 24 hr tablet TAKE ONE-HALF TABLET EVERY EVENING. Please make yearly appt with Corey [name] for November for future refills. 1st attempt 15 tablet 1  . mometasone (NASONEX) 50 MCG/ACT nasal spray 1-2 puffs each nostril once or twice daily if needed (Patient taking differently: as needed. 1-2 puffs each nostril once or twice daily if needed) 17 g 0  . pantoprazole (PROTONIX) 40 MG tablet TAKE ONE TABLET DAILY 90 tablet 0  . rosuvastatin (CRESTOR) 5 MG tablet Take 1 tablet (5 mg total) by mouth at bedtime. 90 tablet 1  . sertraline (ZOLOFT) 100 MG tablet Take 1.5 tablets (150 mg total) by mouth daily. 135 tablet 1  . spironolactone (ALDACTONE) 25 MG tablet TAKE ONE TABLET BY MOUTH ONCE DAILY 30 tablet  8  . SYNJARDY 05-998 MG TABS     . thiamine (VITAMIN B-1) 100 MG tablet TAKE ONE TABLET EACH DAY 90 tablet 1  . warfarin (COUMADIN) 2.5 MG tablet TAKE AS DIRECTED BY [facility] CLINIC 40 tablet 2   No facility-administered medications prior to visit.     ROS Review of Systems  Constitutional: Negative.  Negative for appetite change, diaphoresis and fatigue.  HENT: Negative.   Eyes: Negative for visual disturbance.  Respiratory: Negative for cough, chest tightness, shortness of breath and wheezing.   Cardiovascular: Negative for chest pain, palpitations and leg swelling.  Gastrointestinal: Negative for abdominal pain, constipation, diarrhea, nausea and vomiting.  Endocrine: Negative for cold intolerance, heat intolerance, polydipsia, polyphagia and polyuria.  Genitourinary: Positive for frequency. Negative for difficulty urinating.  Musculoskeletal: Positive for arthralgias (Bilat knee pain). Negative for myalgias.  Skin: Negative.  Negative for color change and pallor.  Neurological: Negative.  Negative for dizziness, weakness, light-headedness and headaches.  Psychiatric/Behavioral: Positive for behavioral problems, confusion and decreased concentration. Negative for agitation, self-injury, sleep disturbance and suicidal ideas. The patient is not nervous/anxious and is not hyperactive.     Objective:  BP 138/80 (BP Location: Left Arm, Patient Position: Sitting, Cuff Size: Normal)   Pulse 68   Temp 97.8 F (36.6 C) (Oral)   Resp 16   Ht 6' (1.829 m)   Wt 190 lb (86.2 kg)   SpO2 96%   BMI 25.77 kg/m   BP  Readings from Last 3 Encounters:  11/22/17 138/80  08/25/17 126/72  06/21/17 126/72    Wt Readings from Last 3 Encounters:  11/22/17 190 lb (86.2 kg)  08/25/17 195 lb (88.5 kg)  06/21/17 201 lb (91.2 kg)    Physical Exam  Constitutional: He is oriented to person, place, and time. No distress.  HENT:  Mouth/Throat: Oropharynx is clear and moist. No oropharyngeal  exudate.  Eyes: Conjunctivae are normal. No scleral icterus.  Neck: Normal range of motion. Neck supple. No JVD present. No thyromegaly present.  Cardiovascular: Normal rate, regular rhythm and normal heart sounds. Exam reveals no gallop.  No murmur heard. Pulmonary/Chest: Effort normal and breath sounds normal. No respiratory distress. He has no wheezes. He has no rhonchi. He has no rales.  Abdominal: Soft. Bowel sounds are normal. He exhibits no mass. There is no hepatosplenomegaly. There is no tenderness.  Musculoskeletal: Normal range of motion. He exhibits no edema or tenderness.       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 He exhibits normal range of motion, no swelling, no ecchymosis and no erythema. No tenderness found.       Left knee: He exhibits deformity. He exhibits normal range of motion, no swelling, no effusion, no erythema and no bony tenderness. No tenderness found.  Lymphadenopathy:    He has no cervical adenopathy.  Neurological: He is alert and oriented to person, place, and time.  Skin: Skin is warm and dry. No rash noted. He is not diaphoretic.  Vitals reviewed.   Lab Results  Component Value Date   WBC 5.1 11/22/2017   HGB 14.0 11/22/2017   HCT 41.2 11/22/2017   PLT 159.0 11/22/2017   GLUCOSE 140 (H) 11/22/2017   CHOL 162 11/22/2017   TRIG 143.0 11/22/2017   HDL 54.50 11/22/2017   LDLCALC 79 11/22/2017   ALT 41 11/22/2017   AST 59 (H) 11/22/2017   NA 139 11/22/2017   K 4.7 11/22/2017   CL 104 11/22/2017   CREATININE 1.48 11/22/2017   BUN 18 11/22/2017   CO2 27 11/22/2017   TSH 0.33 (L) 11/22/2017   PSA <0.015 05/26/2016   INR 1.8 (A) 10/16/2017   HGBA1C 6.3 11/22/2017   MICROALBUR 10.7 (H) 03/22/2017    Ct Head Wo Contrast  Result Date: 04/21/2017 CLINICAL DATA:  Fall at physical therapy today. Hit left side of head. Initial encounter. EXAM: CT HEAD WITHOUT CONTRAST TECHNIQUE: Contiguous axial images were obtained from the base of the skull  through the vertex without intravenous contrast. COMPARISON:  06/03/2016 FINDINGS: Brain: There is no evidence of acute infarct, intracranial hemorrhage, mass, midline shift, or extra-axial fluid collection. 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 Bilateral periventricular white matter hypodensities are similar to the prior study and nonspecific but compatible with mild chronic small vessel ischemic disease. Mild cerebral atrophy is unchanged. Vascular: Calcified atherosclerosis at the skull base. No hyperdense vessel. Skull: No fracture or suspicious osseous lesion. Sinuses/Orbits: New, near complete opacification of the right maxillary sinus. Mild left greater than right ethmoid air cell opacification. Clear mastoid air cells. Unremarkable orbits. Other: None. IMPRESSION: 1. No evidence of acute intracranial abnormality. 2. Chronic ischemia as above. 3. Right maxillary sinusitis. Electronically Signed   By: Logan Bores M.D.   On: 04/21/2017 17:59    Assessment & Plan:   Ksean was seen today for anemia, hypothyroidism, hypertension and hyperlipidemia.  Diagnoses and all orders for this visit:  Essential hypertension-  His blood pressure is well controlled.  Electrolytes and renal function are normal. -     CBC with Differential/Platelet; Future -     Urinalysis, Routine w reflex microscopic; Future -     Comprehensive metabolic panel; Future  Hypothyroidism due to medication- His TSH is in the normal range.  He will remain on the current dose of levothyroxine. -     TSH; Future  Type 2 diabetes mellitus with complication, without long-term current use of insulin ([place])- His A1c is at 6.3%.  His blood sugars are adequately well controlled. -     Hemoglobin A1c; Future -     Comprehensive metabolic panel; Future  Vitamin B12 deficiency anemia due to intrinsic factor deficiency-H&H are normal now.  Will continue B12 replacement therapy. -     CBC with  Differential/Platelet; Future  Other iron deficiency anemias- His H&H are normal now.  Will continue iron replacement therapy. -     CBC with Differential/Platelet; Future  Need for influenza vaccination -     Flu vaccine HIGH DOSE PF (Fluzone High dose)  Hyperlipidemia with target LDL less than 100- He has achieved his LDL goal and is doing well on the statin. -     Lipid panel; Future -     Comprehensive metabolic panel; Future   I am having Corey [name] maintain his albuterol, levalbuterol, albuterol, mometasone, amiodarone, spironolactone, folic acid, warfarin, thiamine, levothyroxine, pantoprazole, metoprolol succinate, rosuvastatin, SYNJARDY, and sertraline.  No orders of the defined types were placed in this encounter.    Follow-up: Return in about 6 months (around 05/24/2018).  Scarlette Calico, MD

## 2017-11-22 NOTE — Patient Instructions (Signed)

## 2017-11-23 ENCOUNTER — Encounter: Payer: Self-pay | Admitting: Internal Medicine

## 2017-11-23 ENCOUNTER — Other Ambulatory Visit: Payer: Self-pay | Admitting: Internal Medicine

## 2017-11-23 DIAGNOSIS — E032 Hypothyroidism due to medicaments and other exogenous substances: Secondary | ICD-10-CM

## 2017-11-23 DIAGNOSIS — H0011 Chalazion right upper eyelid: Secondary | ICD-10-CM | POA: Diagnosis not present

## 2017-11-23 DIAGNOSIS — H0012 Chalazion right lower eyelid: Secondary | ICD-10-CM | POA: Diagnosis not present

## 2017-11-23 MED ORDER — LEVOTHYROXINE SODIUM 100 MCG PO TABS: 100.0000 ug | ORAL_TABLET | Freq: Every day | ORAL | 0 refills | Status: DC

## 2017-11-24 ENCOUNTER — Telehealth: Payer: Self-pay | Admitting: Cardiology

## 2017-11-24 NOTE — Progress Notes (Signed)
NEUROLOGY FOLLOW UP OFFICE NOTE  Corey AHART [id number]  HISTORY OF PRESENT ILLNESS: Corey [name] is a 82 year old right-handed man with paroxysmal atrial fibrillation status post pacemaker, coronary artery disease status post MI, hypertension, OSA, hyperlipidemia, alcohol abuse, and history of TIA and prostate cancer who follows up for cognitive impairment.  He is accompanied by his wife who supplements history.  UPDATE: He underwent repeat neuropsychological testing on 10/30/17 which demonstrated interval decline in his non-amnestic mild cognitive impairment.    He is taking more walks with his wife.  His eating has improved.  He has lost some weight.  He is planning on seeing a therapist.  He is still drinking whiskey daily.  Possibly 2 or 3 drinks but amount uncertain because he just pours it in a glass and doesn't measure it in a jigger.  His shuffling is a little more of an issue.  He has not had recent falls but it is a concern.  He has bowel incontinence, occurs 2 to 3 times a day.    HISTORY: His wife and daughters note some mild memory problems for several years, but has significantly been more noticeable over the past 5 months, since he retired in June 2016. Memory has been the primary problem. He was called his daughters several times in a day asking about the same thing. He still drives but is now mildly disoriented driving around home. There has been no accidents or near-accidents, but he is more irritable and impulsive, and will drive a little faster and tail gate. He has poor night vision. They have a new grandson. Recently, he forgot about a party for his new grandchild and instead made plans with friends. He has begun to forget names of family and friends. He has not had any significant change in personality other than irritability. Besides the aggressive driving, he uses foul language a little more. He does not act inappropriately. He does feel depressed, as he  has been going through an adjustment since retiring. He has had more falls recently. One time, the rug slipped from underneath him. Another time, he tripped over a hose at the gas station. He also had experienced bowel incontinence. He has not had any problems at work prior to retirement. He has not had hallucinations or delusions. He has not had difficulty performing everyday tasks. He notes some problems but doesn't think it is as bad as his family makes it out to be.   He worked in [employer] for many years. Since retirement, he typically doesn't do too much. He will sit around. He doesn't watch much TV during the day. He reads the newspaper daily and able to retain information. He is able to stay focus. He and his wife are not as social as they used to be.   His mother had dementia.   Over the past several years, he has had several operations, including pacemaker and back surgery. He attributes these changes to his multiple surgeries. He also has depression. He has been depressed for several years, triggered by several events in his life, such as pain due to multiple back surgeries and the passing of his daughter. It has worsened particularly since 2015. He and his wife traveled around the country but he didn't quite seem to enjoy it as much as she should. He doesn't have any desire to go out and be active or social. His diet was poor, and he was prone to skipping meals. His sleep was poor. He became more  irritable.  He did not tolerate Aricept due to vivid dreams and he was switched to Exelon.  CT of head from 10/05/14 showed generalized atrophy and chronic small vessel ischemic changes.  He underwent neuropsychological testing with Dr. Si Raider on 09/04/15.  Results were largely within normal limits and not consistent with dementia, however findings suggested non-amnestic mild cognitive impairment as demonstrated by some deficits in visual-spatial construction and language and  immediate encoding.  It was believed that his depression, psychosocial stressors and alcohol consumption played a large role in cognitive difficulties.  As his mild cognitive impairment may have been more affected by depression and alcohol use, Exelon was discontinued.  He was started on Zoloft.  03/22/17 LABS:  B12 463, folate over 23.6, B1 11, TSH 4.15 04/21/17:  CT head showed chronic small vessel disease with chronic left MCA infarct  PAST MEDICAL HISTORY: Past Medical History:  Diagnosis Date  . Alcohol abuse, episodic 05/19/2008  . ALLERGIC RHINITIS 04/15/2009  . Allergy    YEAR ROUND,TAKE SHOTS  . Alzheimer's dementia ([place])    beginning with sx  . Anxiety   . Arthritis   . ASTHMA 11/16/2006   sinusitis-    hx ?yeast patch/white patch on vocal cord as per [facility] 02/25/11-   . Atrial fibrillation ([place]) 11/16/2006   remote CHF related to atrial fib with rapid ventricular response over 25 yrs per office note,  . Cancer [facility]) 2002   prostate cancer  . Chronic systolic CHF (congestive heart failure) (HCC)    echo (10/02/12): EF 24-09%, grade 1 diastolic dysfunction, trivial AI, mild MR, mild RVE  . COLONIC POLYPS, ADENOMATOUS, HX OF 02/14/2008  . CORONARY ARTERY DISEASE 11/16/2006   LHC (07/2011): LAD with luminal irregularities, proximal circumflex 50%, EF 25%  . Depression   . DIVERTICULOSIS, COLON 02/14/2008  . ESOPHAGEAL STRICTURE 01/28/2008  . GERD 02/14/2008  . Headache(784.0)   . HYPERGLYCEMIA 11/16/2006  . HYPERLIPIDEMIA 02/14/2008  . HYPERTENSION 11/16/2006  . ICD (implantable cardiac defibrillator) in place   . INSOMNIA-SLEEP DISORDER-UNSPEC 02/11/2009  . Lesion of vocal cord    bx begine  . Myocardial infarction ([place]) 1993  . NICM (nonischemic cardiomyopathy) ([place])   . OBSTRUCTIVE SLEEP APNEA 11/10/2008  . 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 Pacemaker    St. Jude  . Pneumonia    hx of  . PROSTATE CANCER, HX OF 02/25/2000  . SLEEP APNEA 10/03/2009   LOV Dr [name] 12/12 in  EPIC    Moderate per patient- settings ?6/last sleep study years ago  . Sleep apnea   . Stroke ([place])    Tia  . Symptomatic bradycardia, secondary to sinus node dysfunction 07/08/2011   s/p [facility] PPM by Dr Sallyanne Kuster, upgraded to [place] ICD 12/13 by Dr Rayann Heman ([facility])  . TRANSIENT ISCHEMIC ATTACKS, HX OF 11/16/2006  . Type 2 diabetes mellitus with complication, without long-term current use of insulin ([place]) 11/19/2016    MEDICATIONS: Current Outpatient Medications on File Prior to Visit  Medication Sig Dispense Refill  . albuterol (ACCUNEB) 0.63 MG/3ML nebulizer solution Inhale into the lungs.    Marland Kitchen albuterol (PROVENTIL HFA;VENTOLIN HFA) 108 (90 BASE) MCG/ACT inhaler Inhale 1-2 puffs into the lungs every 6 (six) hours as needed for shortness of breath. 1 Inhaler 11  . amiodarone (PACERONE) 200 MG tablet TAKE ONE-HALF TABLET DAILY 45 tablet 3  . folic acid (FOLVITE) 1 MG tablet TAKE ONE TABLET EACH DAY 90 tablet 1  . levalbuterol (XOPENEX) 1.25 MG/3ML nebulizer solution  Inhale into the lungs as needed.     Marland Kitchen levothyroxine (SYNTHROID, LEVOTHROID) 100 MCG tablet Take 1 tablet (100 mcg total) by mouth daily. 90 tablet 0  . metoprolol succinate (TOPROL-XL) 50 MG 24 hr tablet TAKE ONE-HALF TABLET EVERY EVENING. Please make yearly appt with Dr. Rayann Heman for November for future refills. 1st attempt 15 tablet 1  . mometasone (NASONEX) 50 MCG/ACT nasal spray 1-2 puffs each nostril once or twice daily if needed (Patient taking differently: as needed. 1-2 puffs each nostril once or twice daily if needed) 17 g 0  . pantoprazole (PROTONIX) 40 MG tablet TAKE ONE TABLET DAILY 90 tablet 0  . rosuvastatin (CRESTOR) 5 MG tablet Take 1 tablet (5 mg total) by mouth at bedtime. 90 tablet 1  . sertraline (ZOLOFT) 100 MG tablet Take 1.5 tablets (150 mg total) by mouth daily. 135 tablet 1  . spironolactone (ALDACTONE) 25 MG tablet TAKE ONE TABLET BY MOUTH ONCE DAILY 30 tablet 8  . SYNJARDY 05-998 MG TABS     .  thiamine (VITAMIN B-1) 100 MG tablet TAKE ONE TABLET EACH DAY 90 tablet 1  . warfarin (COUMADIN) 2.5 MG tablet TAKE AS DIRECTED BY COUMADIN CLINIC 40 tablet 2   No current facility-administered medications on file prior to visit.     ALLERGIES: Allergies  Allergen Reactions  . Altace [Ramipril] Cough  . Hydrocodone Other (See Comments)    Severe panic attacks  . Oxycodone Other (See Comments)    Severe panic attacks    FAMILY HISTORY: Family History  Problem Relation Age of Onset  . Heart attack Father   . Heart attack Brother   . Heart disease Maternal Uncle   . Heart attack Maternal Uncle   . Sudden death Daughter 67       unknown  . Autism Son   . Colon cancer Neg Hx   . Esophageal cancer Neg Hx   . Rectal cancer Neg Hx   . Prostate cancer Neg Hx   . Stomach cancer Neg Hx    SOCIAL HISTORY: Social History   Socioeconomic History  . Marital status: Married    Spouse name: [name]  . Number of children: 4  . Years of education: Not on file  . Highest education level: Bachelor's degree (e.g., BA, AB, BS)  Occupational History  . Occupation: Retired  Scientific laboratory technician  . Financial resource strain: Not hard at all  . Food insecurity:    Worry: Never true    Inability: Never true  . Transportation needs:    Medical: No    Non-medical: No  Tobacco Use  . Smoking status: Former Smoker    Packs/day: 1.00    Years: 4.00    Pack years: 4.00    Types: Cigarettes    Last attempt to quit: 03/22/1964    Years since quitting: 53.7  . Smokeless tobacco: Never Used  . Tobacco comment: reports smoked socially  Substance and Sexual Activity  . Alcohol use: Yes    Alcohol/week: 35.0 standard drinks    Types: 35 Shots of liquor per week    Comment: 2 mixed drinks daily, some days  . Drug use: No  . Sexual activity: Yes    Partners: Female  Lifestyle  . Physical activity:    Days per week: 4 days    Minutes per session: 40 min  . Stress: Only a little  Relationships  .  Social connections:    Talks on phone: More than three times  a week    Gets together: More than three times a week    Attends religious service: More than 4 times per year    Active member of club or organization: Not on file    Attends meetings of clubs or organizations: More than 4 times per year    Relationship status: Married  . Intimate partner violence:    Fear of current or ex partner: No    Emotionally abused: No    Physically abused: Not on file    Forced sexual activity: No  Other Topics Concern  . Not on file  Social History Narrative   Pt is right handed. He drinks coffee 3 x week, and tea 4 x week. He walks occasionally.    REVIEW OF SYSTEMS: Constitutional: No fevers, chills, or sweats, no generalized fatigue, change in appetite Eyes: No visual changes, double vision, eye pain Ear, nose and throat: No hearing loss, ear pain, nasal congestion, sore throat Cardiovascular: No chest pain, palpitations Respiratory:  No shortness of breath at rest or with exertion, wheezes GastrointestinaI: No nausea, vomiting, diarrhea, abdominal pain, fecal incontinence Genitourinary:  No dysuria, urinary retention or frequency Musculoskeletal:  No neck pain, back pain Integumentary: No rash, pruritus, skin lesions Neurological: as above Psychiatric: No depression, insomnia, anxiety Endocrine: No palpitations, fatigue, diaphoresis, mood swings, change in appetite, change in weight, increased thirst Hematologic/Lymphatic:  No purpura, petechiae. Allergic/Immunologic: no itchy/runny eyes, nasal congestion, recent allergic reactions, rashes  PHYSICAL EXAM: Blood pressure 104/78, pulse 98, height 6' (1.829 m), weight 190 lb (86.2 kg), SpO2 96 %. General: No acute distress.  Patient appears well-groomed.  Head:  Normocephalic/atraumatic Eyes:  Fundi examined but not visualized Neck: supple, no paraspinal tenderness, full range of motion Heart:  Regular rate and rhythm Lungs:  Clear to  auscultation bilaterally Back: No paraspinal tenderness Neurological Exam: .alert and oriented to person, place, and time. Attention span and concentration intact, recent and remote memory intact, fund of knowledge intact.  Speech fluent and not dysarthric, language intact.  CN II-XII intact. Bulk and tone normal, muscle strength 5/5 throughout.  Sensation to light touch  intact.  Deep tendon reflexes 2+ throughout.  Finger to nose testing intact.  Slight shuffling gait. Romberg negative.  IMPRESSION: Non-amnestic mild cognitive impairment secondary to alcohol abuse Major depressive disorder, moderate  PLAN: 1.  Encourage to continue exercise and improved diet, as well as seeing a therapist to address his mental health. 2.  Encouraged to continue decreasing alcohol use. 3.  Refer to physical therapy to help with gait. 4.  Advised to speak with Dr. Ronnald Ramp regarding bowel incontinence.  He may need to see gastroenterology. 5.  Follow up in 9 months.  26 minutes spent face to face with patient, over 50% spent discussing management.  Metta Clines, DO  CC:  Scarlette Calico, MD

## 2017-11-24 NOTE — Telephone Encounter (Signed)
LMOVM requesting that pt send manual transmission b/c home monitor has not updated in at least 7 days.    

## 2017-11-27 ENCOUNTER — Ambulatory Visit (INDEPENDENT_AMBULATORY_CARE_PROVIDER_SITE_OTHER): Payer: Medicare Other | Admitting: Neurology

## 2017-11-27 ENCOUNTER — Encounter: Payer: Self-pay | Admitting: Neurology

## 2017-11-27 VITALS — BP 104/78 | HR 98 | Ht 72.0 in | Wt 190.0 lb

## 2017-11-27 DIAGNOSIS — G3184 Mild cognitive impairment, so stated: Secondary | ICD-10-CM | POA: Diagnosis not present

## 2017-11-27 DIAGNOSIS — R2689 Other abnormalities of gait and mobility: Secondary | ICD-10-CM | POA: Diagnosis not present

## 2017-11-27 DIAGNOSIS — F1010 Alcohol abuse, uncomplicated: Secondary | ICD-10-CM

## 2017-11-27 DIAGNOSIS — I2510 Atherosclerotic heart disease of native coronary artery without angina pectoris: Secondary | ICD-10-CM | POA: Diagnosis not present

## 2017-11-27 NOTE — Patient Instructions (Addendum)
1.  I advise that you should not drive until you have a formal on-road driving evaluation by an occupational therapist.  I will provide you with some contacts. 2.  We will refer you for physical therapy to help with balance and walking 3.  Continue the good work with diet, exercise.  I am glad you are planning to see a therapist as well. 4.  Follow up in 9 months.  If you are interested in the driving assessment, you can contact:  [employer] in [place]. [phone]. [place] [phone]

## 2017-12-04 ENCOUNTER — Telehealth: Payer: Self-pay | Admitting: Cardiology

## 2017-12-04 NOTE — Telephone Encounter (Signed)
Spoke w/ pt and requested that he send a manual transmission b/c his home monitor has not updated in at least 7 days.   

## 2017-12-08 DIAGNOSIS — F4323 Adjustment disorder with mixed anxiety and depressed mood: Secondary | ICD-10-CM | POA: Diagnosis not present

## 2017-12-15 ENCOUNTER — Telehealth: Payer: Self-pay

## 2017-12-15 NOTE — Telephone Encounter (Signed)
LMOVM requesting that pt send manual transmission b/c home monitor has not updated in at least 7 days.    

## 2017-12-15 NOTE — Progress Notes (Unsigned)
No ICM remote transmission received for 12/14/2017 and next ICM transmission scheduled for 01/11/2018.

## 2017-12-22 ENCOUNTER — Other Ambulatory Visit: Payer: Self-pay | Admitting: Internal Medicine

## 2018-01-01 DIAGNOSIS — F4323 Adjustment disorder with mixed anxiety and depressed mood: Secondary | ICD-10-CM | POA: Diagnosis not present

## 2018-01-02 ENCOUNTER — Telehealth: Payer: Self-pay | Admitting: Cardiology

## 2018-01-02 NOTE — Telephone Encounter (Signed)
Spoke w/ pt and requested that he send a manual transmission b/c his home monitor has not updated in at least 7 days.   

## 2018-01-04 ENCOUNTER — Ambulatory Visit (INDEPENDENT_AMBULATORY_CARE_PROVIDER_SITE_OTHER): Payer: Medicare Other | Admitting: *Deleted

## 2018-01-04 ENCOUNTER — Other Ambulatory Visit: Payer: Self-pay | Admitting: Internal Medicine

## 2018-01-04 DIAGNOSIS — Z5181 Encounter for therapeutic drug level monitoring: Secondary | ICD-10-CM | POA: Diagnosis not present

## 2018-01-04 DIAGNOSIS — I4891 Unspecified atrial fibrillation: Secondary | ICD-10-CM

## 2018-01-04 DIAGNOSIS — H0011 Chalazion right upper eyelid: Secondary | ICD-10-CM | POA: Diagnosis not present

## 2018-01-04 LAB — POCT INR: INR: 1.7 — AB (ref 2.0–3.0)

## 2018-01-04 NOTE — Patient Instructions (Signed)
Description   Start taking 1 tablet every day except 2 tablets on Thursdays.  Recheck in 2 weeks. Call us with any medication changes or concerns to [facility] # [phone].

## 2018-01-09 ENCOUNTER — Telehealth: Payer: Self-pay

## 2018-01-09 NOTE — Telephone Encounter (Signed)
LMOVM requesting that pt send manual transmission b/c home monitor has not updated in at least 7 days.    

## 2018-01-11 ENCOUNTER — Telehealth: Payer: Self-pay

## 2018-01-11 NOTE — Telephone Encounter (Signed)
Spoke with pt and reminded pt of remote transmission that is due today. Pt verbalized understanding.   

## 2018-01-12 ENCOUNTER — Encounter: Payer: Self-pay | Admitting: Cardiology

## 2018-01-22 ENCOUNTER — Ambulatory Visit (INDEPENDENT_AMBULATORY_CARE_PROVIDER_SITE_OTHER): Payer: Medicare Other | Admitting: Pharmacist

## 2018-01-22 DIAGNOSIS — Z5181 Encounter for therapeutic drug level monitoring: Secondary | ICD-10-CM | POA: Diagnosis not present

## 2018-01-22 DIAGNOSIS — I4891 Unspecified atrial fibrillation: Secondary | ICD-10-CM

## 2018-01-22 LAB — POCT INR: INR: 2 (ref 2.0–3.0)

## 2018-01-22 NOTE — Patient Instructions (Signed)
Start taking 1 tablet every day except 2 tablets on Thursdays.  Recheck in 2 weeks. Call us with any medication changes or concerns to [facility] # [phone].

## 2018-01-23 ENCOUNTER — Ambulatory Visit (INDEPENDENT_AMBULATORY_CARE_PROVIDER_SITE_OTHER): Payer: Medicare Other | Admitting: Physician Assistant

## 2018-01-23 ENCOUNTER — Encounter: Payer: Self-pay | Admitting: Physician Assistant

## 2018-01-23 ENCOUNTER — Other Ambulatory Visit: Payer: Self-pay

## 2018-01-23 VITALS — BP 122/72 | HR 72 | Temp 97.9°F | Resp 16 | Ht 72.0 in | Wt 191.0 lb

## 2018-01-23 DIAGNOSIS — J208 Acute bronchitis due to other specified organisms: Secondary | ICD-10-CM

## 2018-01-23 DIAGNOSIS — I2510 Atherosclerotic heart disease of native coronary artery without angina pectoris: Secondary | ICD-10-CM

## 2018-01-23 MED ORDER — BENZONATATE 100 MG PO CAPS: 100.0000 mg | ORAL_CAPSULE | Freq: Three times a day (TID) | ORAL | 0 refills | Status: DC | PRN

## 2018-01-23 NOTE — Patient Instructions (Signed)
Please keep well hydrated and get plenty of rest. Take the tessalon as directed for cough. Start a saline nasal rinse to flush out nasal passages.  Symptoms and exam are consistent with a viral bronchitis. However I want you to keep a close eye on symptoms. If not easing up in the next 48 hours, please give [place] a call and we will consider adding on an antibiotic.    Viral Illness, Adult Viruses are tiny germs that can get into a person's body and cause illness. There are many different types of viruses, and they cause many types of illness. Viral illnesses can range from mild to severe. They can affect various parts of the body. Common illnesses that are caused by a virus include colds and the flu. Viral illnesses also include serious conditions such as HIV/AIDS (human immunodeficiency virus/acquired immunodeficiency syndrome). A few viruses have been linked to certain cancers. What are the causes? Many types of viruses can cause illness. Viruses invade cells in your body, multiply, and cause the infected cells to malfunction or die. When the cell dies, it releases more of the virus. When this happens, you develop symptoms of the illness, and the virus continues to spread to other cells. If the virus takes over the function of the cell, it can cause the cell to divide and grow out of control, as is the case when a virus causes cancer. Different viruses get into the body in different ways. You can get a virus by:  Swallowing food or water that is contaminated with the virus.  Breathing in droplets that have been coughed or sneezed into the air by an infected person.  Touching a surface that has been contaminated with the virus and then touching your eyes, nose, or mouth.  Being bitten by an insect or animal that carries the virus.  Having sexual contact with a person who is infected with the virus.  Being exposed to blood or fluids that contain the virus, either through an open cut or during a  transfusion. If a virus enters your body, your body's defense system (immune system) will try to fight the virus. You may be at higher risk for a viral illness if your immune system is weak. What are the signs or symptoms? Symptoms vary depending on the type of virus and the location of the cells that it invades. Common symptoms of the main types of viral illnesses include: Cold and flu viruses  Fever.  Headache.  Sore throat.  Muscle aches.  Nasal congestion.  Cough. Digestive system (gastrointestinal) viruses  Fever.  Abdominal pain.  Nausea.  Diarrhea. Liver viruses (hepatitis)  Loss of appetite.  Tiredness.  Yellowing of the skin (jaundice). Brain and spinal cord viruses  Fever.  Headache.  Stiff neck.  Nausea and vomiting.  Confusion or sleepiness. Skin viruses  Warts.  Itching.  Rash. Sexually transmitted viruses  Discharge.  Swelling.  Redness.  Rash. How is this treated? Viruses can be difficult to treat because they live within cells. Antibiotic medicines do not treat viruses because these drugs do not get inside cells. Treatment for a viral illness may include:  Resting and drinking plenty of fluids.  Medicines to relieve symptoms. These can include over-the-counter medicine for pain and fever, medicines for cough or congestion, and medicines to relieve diarrhea.  Antiviral medicines. These drugs are available only for certain types of viruses. They may help reduce flu symptoms if taken early. There are also many antiviral medicines for hepatitis and  HIV/AIDS. Some viral illnesses can be prevented with vaccinations. A common example is the flu shot. Follow these instructions at home: Medicines   Take over-the-counter and prescription medicines only as told by your health care provider.  If you were prescribed an antiviral medicine, take it as told by your health care provider. Do not stop taking the medicine even if you start to  feel better.  Be aware of when antibiotics are needed and when they are not needed. Antibiotics do not treat viruses. If your health care provider thinks that you may have a bacterial infection as well as a viral infection, you may get an antibiotic. ? Do not ask for an antibiotic prescription if you have been diagnosed with a viral illness. That will not make your illness go away faster. ? Frequently taking antibiotics when they are not needed can lead to antibiotic resistance. When this develops, the medicine no longer works against the bacteria that it normally fights. General instructions  Drink enough fluids to keep your urine clear or pale yellow.  Rest as much as possible.  Return to your normal activities as told by your health care provider. Ask your health care provider what activities are safe for you.  Keep all follow-up visits as told by your health care provider. This is important. How is this prevented? Take these actions to reduce your risk of viral infection:  Eat a healthy diet and get enough rest.  Wash your hands often with soap and water. This is especially important when you are in public places. If soap and water are not available, use hand sanitizer.  Avoid close contact with friends and family who have a viral illness.  If you travel to areas where viral gastrointestinal infection is common, avoid drinking water or eating raw food.  Keep your immunizations up to date. Get a flu shot every year as told by your health care provider.  Do not share toothbrushes, nail clippers, razors, or needles with other people.  Always practice safe sex.  Contact a health care provider if:  You have symptoms of a viral illness that do not go away.  Your symptoms come back after going away.  Your symptoms get worse. Get help right away if:  You have trouble breathing.  You have a severe headache or a stiff neck.  You have severe vomiting or abdominal pain. This  information is not intended to replace advice given to you by your health care provider. Make sure you discuss any questions you have with your health care provider. Document Released: 05/22/2015 Document Revised: 06/24/2015 Document Reviewed: 05/22/2015 Elsevier Interactive Patient Education  2019 [employer].

## 2018-01-23 NOTE — Progress Notes (Signed)
Patient presents to clinic today c/o 1 week of chest congestion and cough that is productive of clear sputum. Notes sinus pressure but denies sinus pain, tooth pain, ear pain. Denies fever. Notes some chills. Denies recent travel. Grandchild was sick at Christmas and he feels this is where he got sick from. Does note significant fatigue. Has not taken anything for symptoms. Is worried about OTC medications due to heart history.  Past Medical History:  Diagnosis Date  . Alcohol abuse, episodic 05/19/2008  . ALLERGIC RHINITIS 04/15/2009  . Allergy    YEAR ROUND,TAKE SHOTS  . Alzheimer's dementia ([place])    beginning with sx  . Anxiety   . Arthritis   . ASTHMA 11/16/2006   sinusitis-    hx ?yeast patch/white patch on vocal cord as per [facility] 02/25/11-   . Atrial fibrillation ([place]) 11/16/2006   remote CHF related to atrial fib with rapid ventricular response over 25 yrs per office note,  . Cancer [facility]) 2002   prostate cancer  . Chronic systolic CHF (congestive heart failure) (HCC)    echo (10/02/12): EF 19-37%, grade 1 diastolic dysfunction, trivial AI, mild MR, mild RVE  . COLONIC POLYPS, ADENOMATOUS, HX OF 02/14/2008  . CORONARY ARTERY DISEASE 11/16/2006   LHC (07/2011): LAD with luminal irregularities, proximal circumflex 50%, EF 25%  . Depression   . DIVERTICULOSIS, COLON 02/14/2008  . ESOPHAGEAL STRICTURE 01/28/2008  . GERD 02/14/2008  . Headache(784.0)   . HYPERGLYCEMIA 11/16/2006  . HYPERLIPIDEMIA 02/14/2008  . HYPERTENSION 11/16/2006  . ICD (implantable cardiac defibrillator) in place   . INSOMNIA-SLEEP DISORDER-UNSPEC 02/11/2009  . Lesion of vocal cord    bx begine  . Myocardial infarction ([place]) 1993  . NICM (nonischemic cardiomyopathy) ([place])   . OBSTRUCTIVE SLEEP APNEA 11/10/2008  . 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 Pacemaker    St. Jude  . Pneumonia    hx of  . PROSTATE CANCER, HX OF 02/25/2000  . SLEEP APNEA 10/03/2009   LOV Dr [name] 12/12 in EPIC    Moderate per patient-  settings ?6/last sleep study years ago  . Sleep apnea   . Stroke ([place])    Tia  . Symptomatic bradycardia, secondary to sinus node dysfunction 07/08/2011   s/p [facility] Scientific PPM by Dr Sallyanne Kuster, upgraded to [place] ICD 12/13 by Dr Rayann Heman ([facility])  . TRANSIENT ISCHEMIC ATTACKS, HX OF 11/16/2006  . Type 2 diabetes mellitus with complication, without long-term current use of insulin ([place]) 11/19/2016    Current Outpatient Medications on File Prior to Visit  Medication Sig Dispense Refill  . albuterol (ACCUNEB) 0.63 MG/3ML nebulizer solution Inhale into the lungs.    Marland Kitchen albuterol (PROVENTIL HFA;VENTOLIN HFA) 108 (90 BASE) MCG/ACT inhaler Inhale 1-2 puffs into the lungs every 6 (six) hours as needed for shortness of breath. 1 Inhaler 11  . amiodarone (PACERONE) 200 MG tablet TAKE ONE-HALF TABLET DAILY 45 tablet 3  . folic acid (FOLVITE) 1 MG tablet TAKE ONE TABLET EACH DAY 90 tablet 1  . levalbuterol (XOPENEX) 1.25 MG/3ML nebulizer solution Inhale into the lungs as needed.     Marland Kitchen levothyroxine (SYNTHROID, LEVOTHROID) 100 MCG tablet Take 1 tablet (100 mcg total) by mouth daily. 90 tablet 0  . metoprolol succinate (TOPROL-XL) 50 MG 24 hr tablet Please call to schedule overdue appt with Dr Rayann Heman for future refills. (2nd attempt) 15 tablet 1  . mometasone (NASONEX) 50 MCG/ACT nasal spray 1-2 puffs each nostril once or twice daily if needed (Patient taking differently:  as needed. 1-2 puffs each nostril once or twice daily if needed) 17 g 0  . pantoprazole (PROTONIX) 40 MG tablet TAKE ONE TABLET DAILY 90 tablet 0  . rosuvastatin (CRESTOR) 5 MG tablet Take 1 tablet (5 mg total) by mouth at bedtime. 90 tablet 1  . sertraline (ZOLOFT) 100 MG tablet Take 1.5 tablets (150 mg total) by mouth daily. 135 tablet 1  . spironolactone (ALDACTONE) 25 MG tablet TAKE ONE TABLET BY MOUTH ONCE DAILY 30 tablet 8  . SYNJARDY 05-998 MG TABS     . thiamine (VITAMIN B-1) 100 MG tablet TAKE ONE TABLET EACH DAY 90 tablet 1  .  warfarin (COUMADIN) 2.5 MG tablet TAKE AS DIRECTED BY COUMADIN CLINIC 35 tablet 0   No current facility-administered medications on file prior to visit.     Allergies  Allergen Reactions  . Altace [Ramipril] Cough  . Hydrocodone Other (See Comments)    Severe panic attacks  . Oxycodone Other (See Comments)    Severe panic attacks    Family History  Problem Relation Age of Onset  . Heart attack Father   . Heart attack Brother   . Heart disease Maternal Uncle   . Heart attack Maternal Uncle   . Sudden death Daughter 60       unknown  . Autism Son   . Colon cancer Neg Hx   . Esophageal cancer Neg Hx   . Rectal cancer Neg Hx   . Prostate cancer Neg Hx   . Stomach cancer Neg Hx     Social History   Socioeconomic History  . Marital status: Married    Spouse name: [name]  . Number of children: 4  . Years of education: Not on file  . Highest education level: Bachelor's degree (e.g., BA, AB, BS)  Occupational History  . Occupation: Retired  Scientific laboratory technician  . Financial resource strain: Not hard at all  . Food insecurity:    Worry: Never true    Inability: Never true  . Transportation needs:    Medical: No    Non-medical: No  Tobacco Use  . Smoking status: Former Smoker    Packs/day: 1.00    Years: 4.00    Pack years: 4.00    Types: Cigarettes    Last attempt to quit: 03/22/1964    Years since quitting: 53.8  . Smokeless tobacco: Never Used  . Tobacco comment: reports smoked socially  Substance and Sexual Activity  . Alcohol use: Yes    Alcohol/week: 35.0 standard drinks    Types: 35 Shots of liquor per week    Comment: 2 mixed drinks daily, some days  . Drug use: No  . Sexual activity: Yes    Partners: Female  Lifestyle  . Physical activity:    Days per week: 4 days    Minutes per session: 40 min  . Stress: Only a little  Relationships  . Social connections:    Talks on phone: More than three times a week    Gets together: More than three times a week     Attends religious service: More than 4 times per year    Active member of club or organization: Not on file    Attends meetings of clubs or organizations: More than 4 times per year    Relationship status: Married  Other Topics Concern  . Not on file  Social History Narrative   Pt is right handed. He drinks coffee 3 x week, and tea 4  x week. He walks occasionally.   Review of Systems - See HPI.  All other ROS are negative.  BP 122/72   Pulse 72   Temp 97.9 F (36.6 C) (Oral)   Resp 16   Ht 6' (1.829 m)   Wt 191 lb (86.6 kg)   SpO2 98%   BMI 25.90 kg/m   Physical Exam Vitals signs reviewed.  Constitutional:      Appearance: Normal appearance.  HENT:     Head: Normocephalic and atraumatic.     Right Ear: Tympanic membrane normal.     Left Ear: Tympanic membrane normal.     Nose: Rhinorrhea present.     Mouth/Throat:     Mouth: Mucous membranes are moist.  Eyes:     Conjunctiva/sclera: Conjunctivae normal.  Neck:     Musculoskeletal: Neck supple.  Cardiovascular:     Rate and Rhythm: Normal rate and regular rhythm.  Pulmonary:     Effort: Pulmonary effort is normal.     Breath sounds: Normal breath sounds.  Neurological:     Mental Status: He is alert.  Psychiatric:        Mood and Affect: Mood normal.    Recent Results (from the past 2160 hour(s))  Comprehensive metabolic panel     Status: Abnormal   Collection Time: 11/22/17  2:42 PM  Result Value Ref Range   Sodium 139 135 - 145 mEq/L   Potassium 4.7 3.5 - 5.1 mEq/L   Chloride 104 96 - 112 mEq/L   CO2 27 19 - 32 mEq/L   Glucose, Bld 140 (H) 70 - 99 mg/dL   BUN 18 6 - 23 mg/dL   Creatinine, Ser 1.48 0.40 - 1.50 mg/dL   Total Bilirubin 0.5 0.2 - 1.2 mg/dL   Alkaline Phosphatase 105 39 - 117 U/L   AST 59 (H) 0 - 37 U/L   ALT 41 0 - 53 U/L   Total Protein 7.5 6.0 - 8.3 g/dL   Albumin 4.4 3.5 - 5.2 g/dL   Calcium 9.4 8.4 - 10.5 mg/dL   GFR 48.61 (L) >60.00 mL/min  Hemoglobin A1c     Status: None    Collection Time: 11/22/17  2:42 PM  Result Value Ref Range   Hgb A1c MFr Bld 6.3 4.6 - 6.5 %    Comment: Glycemic Control Guidelines for People with Diabetes:Non Diabetic:  <6%Goal of Therapy: <7%Additional Action Suggested:  >8%   TSH     Status: Abnormal   Collection Time: 11/22/17  2:42 PM  Result Value Ref Range   TSH 0.33 (L) 0.35 - 4.50 uIU/mL  Urinalysis, Routine w reflex microscopic     Status: Abnormal   Collection Time: 11/22/17  2:42 PM  Result Value Ref Range   Color, Urine YELLOW Yellow;Lt. Yellow   APPearance CLEAR Clear   Specific Gravity, Urine 1.020 1.000 - 1.030   pH 5.5 5.0 - 8.0   Total Protein, Urine NEGATIVE Negative   Urine Glucose >=1000 (A) Negative   Ketones, ur NEGATIVE Negative   Bilirubin Urine NEGATIVE Negative   Hgb urine dipstick NEGATIVE Negative   Urobilinogen, UA 0.2 0.0 - 1.0   Leukocytes, UA NEGATIVE Negative   Nitrite NEGATIVE Negative   WBC, UA 0-2/hpf 0-2/hpf   RBC / HPF none seen 0-2/hpf   Squamous Epithelial / LPF Rare(0-4/hpf) Rare(0-4/hpf)  Lipid panel     Status: None   Collection Time: 11/22/17  2:42 PM  Result Value Ref Range   Cholesterol  162 0 - 200 mg/dL    Comment: ATP III Classification       Desirable:  < 200 mg/dL               Borderline High:  200 - 239 mg/dL          High:  > = 240 mg/dL   Triglycerides 143.0 0.0 - 149.0 mg/dL    Comment: Normal:  <150 mg/dLBorderline High:  150 - 199 mg/dL   HDL 54.50 >39.00 mg/dL   VLDL 28.6 0.0 - 40.0 mg/dL   LDL Cholesterol 79 0 - 99 mg/dL   Total CHOL/HDL Ratio 3     Comment:                Men          Women1/2 Average Risk     3.4          3.3Average Risk          5.0          4.42X Average Risk          9.6          7.13X Average Risk          15.0          11.0                       NonHDL 107.82     Comment: NOTE:  Non-HDL goal should be 30 mg/dL higher than patient's LDL goal (i.e. LDL goal of < 70 mg/dL, would have non-HDL goal of < 100 mg/dL)  CBC with Differential/Platelet      Status: Abnormal   Collection Time: 11/22/17  2:42 PM  Result Value Ref Range   WBC 5.1 4.0 - 10.5 K/uL   RBC 4.53 4.22 - 5.81 Mil/uL   Hemoglobin 14.0 13.0 - 17.0 g/dL   HCT 41.2 39.0 - 52.0 %   MCV 91.0 78.0 - 100.0 fl   MCHC 33.9 30.0 - 36.0 g/dL   RDW 16.3 (H) 11.5 - 15.5 %   Platelets 159.0 150.0 - 400.0 K/uL   Neutrophils Relative % 55.9 43.0 - 77.0 %   Lymphocytes Relative 32.4 12.0 - 46.0 %   Monocytes Relative 7.2 3.0 - 12.0 %   Eosinophils Relative 3.1 0.0 - 5.0 %   Basophils Relative 1.4 0.0 - 3.0 %   Neutro Abs 2.8 1.4 - 7.7 K/uL   Lymphs Abs 1.6 0.7 - 4.0 K/uL   Monocytes Absolute 0.4 0.1 - 1.0 K/uL   Eosinophils Absolute 0.2 0.0 - 0.7 K/uL   Basophils Absolute 0.1 0.0 - 0.1 K/uL  POCT INR     Status: Abnormal   Collection Time: 01/04/18  2:50 PM  Result Value Ref Range   INR 1.7 (A) 2.0 - 3.0  POCT INR     Status: None   Collection Time: 01/22/18 11:29 AM  Result Value Ref Range   INR 2.0 2.0 - 3.0    Assessment/Plan: 1. Viral bronchitis Vitals stable. Lungs CTAB. Clear sputum. No sinus tenderness. Supportive measures and OTC medications reviewed. Start Tessalon for cough. Strict return precautions reviewed with patient. If not easing up in 48 hours, will add on antibiotic to be cautious giving advanced age and medical history. - benzonatate (TESSALON) 100 MG capsule; Take 1 capsule (100 mg total) by mouth 3 (three) times daily as needed for cough.  Dispense: 30 capsule; Refill: 0  Leeanne Rio, PA-C

## 2018-01-23 NOTE — Progress Notes (Signed)
Unable to reach patient for [facility] ICM follow up.  Last remote transmission was 08/31/2017 and 03/16/2017 was last contact with patient.  Disenrolled from ICM since no longer able to reach.  Per protocol, patient will continue to be followed by device clinic every 91 days.

## 2018-01-25 ENCOUNTER — Other Ambulatory Visit: Payer: Self-pay | Admitting: Internal Medicine

## 2018-01-27 ENCOUNTER — Other Ambulatory Visit: Payer: Self-pay | Admitting: Gastroenterology

## 2018-01-29 ENCOUNTER — Ambulatory Visit: Payer: Medicare Other | Admitting: Physician Assistant

## 2018-01-29 ENCOUNTER — Ambulatory Visit (INDEPENDENT_AMBULATORY_CARE_PROVIDER_SITE_OTHER): Payer: Medicare Other | Admitting: Physician Assistant

## 2018-01-29 ENCOUNTER — Encounter: Payer: Self-pay | Admitting: Physician Assistant

## 2018-01-29 VITALS — BP 138/70 | HR 69 | Temp 97.9°F | Ht 72.0 in | Wt 192.2 lb

## 2018-01-29 DIAGNOSIS — J069 Acute upper respiratory infection, unspecified: Secondary | ICD-10-CM

## 2018-01-29 DIAGNOSIS — J0110 Acute frontal sinusitis, unspecified: Secondary | ICD-10-CM

## 2018-01-29 LAB — POC INFLUENZA A&B (BINAX/QUICKVUE)
INFLUENZA A, POC: NEGATIVE
Influenza B, POC: NEGATIVE

## 2018-01-29 MED ORDER — BENZONATATE 100 MG PO CAPS: 100.0000 mg | ORAL_CAPSULE | Freq: Three times a day (TID) | ORAL | 0 refills | Status: DC | PRN

## 2018-01-29 MED ORDER — DOXYCYCLINE HYCLATE 100 MG PO TABS: 100.0000 mg | ORAL_TABLET | Freq: Two times a day (BID) | ORAL | 0 refills | Status: DC

## 2018-01-29 NOTE — Progress Notes (Signed)
Corey [name] is a 83 y.o. male here for a new problem.  I acted as a Education administrator for [employer], PA-C Anselmo Pickler, LPN  History of Present Illness:   Chief Complaint  Patient presents with  . Cough    Was seen on 01/23/18 at [facility] and diagnosed with viral bronchitis.  Cough  This is a recurrent problem. Episode onset: Started 2-3 weeks ago. The problem has been gradually worsening. The cough is productive of sputum (expectorating brown sputum). Associated symptoms include headaches, nasal congestion, postnasal drip and a sore throat. Pertinent negatives include no chills, fever or shortness of breath. Associated symptoms comments: Nausea. Treatments tried: tessalon pearles. The treatment provided no relief. His past medical history is significant for bronchitis and pneumonia.   Denies: chest pain  Past Medical History:  Diagnosis Date  . Alcohol abuse, episodic 05/19/2008  . ALLERGIC RHINITIS 04/15/2009  . Allergy    YEAR ROUND,TAKE SHOTS  . Alzheimer's dementia ([place])    beginning with sx  . Anxiety   . Arthritis   . ASTHMA 11/16/2006   sinusitis-    hx ?yeast patch/white patch on vocal cord as per [facility] 02/25/11-   . Atrial fibrillation ([place]) 11/16/2006   remote CHF related to atrial fib with rapid ventricular response over 25 yrs per office note,  . Cancer [facility]) 2002   prostate cancer  . Chronic systolic CHF (congestive heart failure) (HCC)    echo (10/02/12): EF 32-95%, grade 1 diastolic dysfunction, trivial AI, mild MR, mild RVE  . COLONIC POLYPS, ADENOMATOUS, HX OF 02/14/2008  . CORONARY ARTERY DISEASE 11/16/2006   LHC (07/2011): LAD with luminal irregularities, proximal circumflex 50%, EF 25%  . Depression   . DIVERTICULOSIS, COLON 02/14/2008  . ESOPHAGEAL STRICTURE 01/28/2008  . GERD 02/14/2008  . Headache(784.0)   . HYPERGLYCEMIA 11/16/2006  . HYPERLIPIDEMIA 02/14/2008  . HYPERTENSION 11/16/2006  . ICD (implantable cardiac defibrillator) in place   .  INSOMNIA-SLEEP DISORDER-UNSPEC 02/11/2009  . Lesion of vocal cord    bx begine  . Myocardial infarction ([place]) 1993  . NICM (nonischemic cardiomyopathy) ([place])   . OBSTRUCTIVE SLEEP APNEA 11/10/2008  . 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 Pacemaker    St. Jude  . Pneumonia    hx of  . PROSTATE CANCER, HX OF 02/25/2000  . SLEEP APNEA 10/03/2009   LOV Dr [name] 12/12 in EPIC    Moderate per patient- settings ?6/last sleep study years ago  . Sleep apnea   . Stroke ([place])    Tia  . Symptomatic bradycardia, secondary to sinus node dysfunction 07/08/2011   s/p [facility] Scientific PPM by Dr Sallyanne Kuster, upgraded to [place] ICD 12/13 by Dr Rayann Heman (SJM)  . TRANSIENT ISCHEMIC ATTACKS, HX OF 11/16/2006  . Type 2 diabetes mellitus with complication, without long-term current use of insulin ([place]) 11/19/2016     Social History   Socioeconomic History  . Marital status: Married    Spouse name: [name]  . Number of children: 4  . Years of education: Not on file  . Highest education level: Bachelor's degree (e.g., BA, AB, BS)  Occupational History  . Occupation: Retired  Scientific laboratory technician  . Financial resource strain: Not hard at all  . Food insecurity:    Worry: Never true    Inability: Never true  . Transportation needs:    Medical: No    Non-medical: No  Tobacco Use  . Smoking status: Former Smoker    Packs/day: 1.00    Years:  4.00    Pack years: 4.00    Types: Cigarettes    Last attempt to quit: 03/22/1964    Years since quitting: 53.8  . Smokeless tobacco: Never Used  . Tobacco comment: reports smoked socially  Substance and Sexual Activity  . Alcohol use: Yes    Alcohol/week: 35.0 standard drinks    Types: 35 Shots of liquor per week    Comment: 2 mixed drinks daily, some days  . Drug use: No  . Sexual activity: Yes    Partners: Female  Lifestyle  . Physical activity:    Days per week: 4 days    Minutes per session: 40 min  . Stress: Only a little  Relationships  . Social  connections:    Talks on phone: More than three times a week    Gets together: More than three times a week    Attends religious service: More than 4 times per year    Active member of club or organization: Not on file    Attends meetings of clubs or organizations: More than 4 times per year    Relationship status: Married  . Intimate partner violence:    Fear of current or ex partner: No    Emotionally abused: No    Physically abused: Not on file    Forced sexual activity: No  Other Topics Concern  . Not on file  Social History Narrative   Pt is right handed. He drinks coffee 3 x week, and tea 4 x week. He walks occasionally.    Past Surgical History:  Procedure Laterality Date  . BACK SURGERY    . BI-VENTRICULAR IMPLANTABLE CARDIOVERTER DEFIBRILLATOR UPGRADE N/A 01/16/2012   Procedure: BI-VENTRICULAR IMPLANTABLE CARDIOVERTER DEFIBRILLATOR UPGRADE;  Surgeon: Thompson Grayer, MD;  Location: [facility] C[facility] LAB;  Service: Cardiovascular;  Laterality: N/A;  . CARDIAC CATHETERIZATION     several  . CARDIAC DEFIBRILLATOR PLACEMENT  01/16/12   Upgrade to a biventricular SJM ICD by DR Allred  . COLONOSCOPY    . ESOPHAGOGASTRODUODENOSCOPY     with dilitation  . INSERT / REPLACE / REMOVE PACEMAKER    . KNEE ARTHROSCOPY     2 surgeries right and 1 left  . LEFT AND RIGHT HEART CATHETERIZATION WITH CORONARY ANGIOGRAM N/A 08/18/2011   Procedure: LEFT AND RIGHT HEART CATHETERIZATION WITH CORONARY ANGIOGRAM;  Surgeon: [name], MD;  Location: [place] CATH LAB;  Service: Cardiovascular;  Laterality: N/A;  . LUMBAR LAMINECTOMY/DECOMPRESSION MICRODISCECTOMY  03/02/2011   Procedure: LUMBAR LAMINECTOMY/DECOMPRESSION MICRODISCECTOMY;  Surgeon: [name], MD;  [facility]S;  Service: Orthopedics;  Laterality: N/A;  Decompression Lumbar 4 - Lumbar(X-Ray)  . PACEMAKER INSERTION  06/2011   [employer] PPM implanted by Dr Sallyanne Kuster  . PERMANENT PACEMAKER INSERTION Left 07/08/2011   Procedure:  PERMANENT PACEMAKER INSERTION;  Surgeon: [name], MD;  Location: [place] CATH LAB;  Service: Cardiovascular;  Laterality: Left;  . PROSTATE SURGERY  2/02   prostate cancer  . TONSILECTOMY, ADENOIDECTOMY, BILATERAL MYRINGOTOMY AND TUBES    . TONSILLECTOMY  as child  . TOTAL KNEE ARTHROPLASTY Left 06/04/2012   Procedure: TOTAL KNEE ARTHROPLASTY;  Surgeon: [name], MD;  [facility] ORS;  Service: Orthopedics;  Laterality: Left;  . UVULOPALATOPHARYNGOPLASTY  2013  . vocal cord lesion bx      Family History  Problem Relation Age of Onset  . Heart attack Father   . Heart attack Brother   . Heart disease Maternal Uncle   . Heart attack Maternal  Uncle   . Sudden death Daughter 15       unknown  . Autism Son   . Colon cancer Neg Hx   . Esophageal cancer Neg Hx   . Rectal cancer Neg Hx   . Prostate cancer Neg Hx   . Stomach cancer Neg Hx     Allergies  Allergen Reactions  . Altace [Ramipril] Cough  . Hydrocodone Other (See Comments)    Severe panic attacks  . Oxycodone Other (See Comments)    Severe panic attacks    Current Medications:   Current Outpatient Medications:  .  albuterol (ACCUNEB) 0.63 MG/3ML nebulizer solution, Inhale into the lungs., Disp: , Rfl:  .  albuterol (PROVENTIL HFA;VENTOLIN HFA) 108 (90 BASE) MCG/ACT inhaler, Inhale 1-2 puffs into the lungs every 6 (six) hours as needed for shortness of breath., Disp: 1 Inhaler, Rfl: 11 .  amiodarone (PACERONE) 200 MG tablet, TAKE ONE-HALF TABLET DAILY, Disp: 45 tablet, Rfl: 3 .  benzonatate (TESSALON) 100 MG capsule, Take 1 capsule (100 mg total) by mouth 3 (three) times daily as needed for cough., Disp: 30 capsule, Rfl: 0 .  folic acid (FOLVITE) 1 MG tablet, TAKE ONE TABLET EACH DAY, Disp: 90 tablet, Rfl: 1 .  levalbuterol (XOPENEX) 1.25 MG/3ML nebulizer solution, Inhale into the lungs as needed. , Disp: , Rfl:  .  levothyroxine (SYNTHROID, LEVOTHROID) 100 MCG tablet, Take 1 tablet (100 mcg total) by mouth  daily., Disp: 90 tablet, Rfl: 0 .  metoprolol succinate (TOPROL-XL) 50 MG 24 hr tablet, Please call to schedule overdue appt with Dr Rayann Heman for future refills. (2nd attempt), Disp: 15 tablet, Rfl: 1 .  mometasone (NASONEX) 50 MCG/ACT nasal spray, 1-2 puffs each nostril once or twice daily if needed (Patient taking differently: as needed. 1-2 puffs each nostril once or twice daily if needed), Disp: 17 g, Rfl: 0 .  pantoprazole (PROTONIX) 40 MG tablet, TAKE ONE TABLET DAILY, Disp: 90 tablet, Rfl: 0 .  rosuvastatin (CRESTOR) 5 MG tablet, Take 1 tablet (5 mg total) by mouth at bedtime., Disp: 90 tablet, Rfl: 1 .  sertraline (ZOLOFT) 100 MG tablet, Take 1.5 tablets (150 mg total) by mouth daily., Disp: 135 tablet, Rfl: 1 .  spironolactone (ALDACTONE) 25 MG tablet, TAKE ONE TABLET BY MOUTH ONCE DAILY, Disp: 30 tablet, Rfl: 8 .  SYNJARDY 05-998 MG TABS, , Disp: , Rfl:  .  thiamine (VITAMIN B-1) 100 MG tablet, TAKE ONE TABLET EACH DAY, Disp: 90 tablet, Rfl: 1 .  warfarin (COUMADIN) 2.5 MG tablet, TAKE AS DIRECTED BY COUMADIN CLINIC, Disp: 35 tablet, Rfl: 0 .  doxycycline (VIBRA-TABS) 100 MG tablet, Take 1 tablet (100 mg total) by mouth 2 (two) times daily., Disp: 20 tablet, Rfl: 0   Review of Systems:   Review of Systems  Constitutional: Negative for chills and fever.  HENT: Positive for postnasal drip and sore throat.   Respiratory: Positive for cough. Negative for shortness of breath.   Neurological: Positive for headaches.    Vitals:   Vitals:   01/29/18 1549  BP: 138/70  Pulse: 69  Temp: 97.9 F (36.6 C)  TempSrc: Oral  SpO2: 96%  Weight: 192 lb 4 oz (87.2 kg)  Height: 6' (1.829 m)     Body mass index is 26.07 kg/m.  Physical Exam:   Physical Exam Vitals signs and nursing note reviewed.  Constitutional:      General: He is not in acute distress.    Appearance: He is well-developed. He  is not ill-appearing or toxic-appearing.  HENT:     Head: Normocephalic and atraumatic.      Right Ear: Tympanic membrane, ear canal and external ear normal. Tympanic membrane is not erythematous, retracted or bulging.     Left Ear: Tympanic membrane, ear canal and external ear normal. Tympanic membrane is not erythematous, retracted or bulging.     Nose:     Right Sinus: Frontal sinus tenderness present. No maxillary sinus tenderness.     Left Sinus: Frontal sinus tenderness present. No maxillary sinus tenderness.     Mouth/Throat:     Lips: Pink.     Mouth: Mucous membranes are moist.     Pharynx: Posterior oropharyngeal erythema present.     Tonsils: Swelling: 0 on the right. 0 on the left.  Eyes:     General: Lids are normal.     Conjunctiva/sclera: Conjunctivae normal.  Neck:     Trachea: Trachea normal.  Cardiovascular:     Rate and Rhythm: Normal rate and regular rhythm.     Heart sounds: Normal heart sounds, S1 normal and S2 normal.  Pulmonary:     Effort: Pulmonary effort is normal.     Breath sounds: Normal breath sounds. No decreased breath sounds, wheezing, rhonchi or rales.  Lymphadenopathy:     Cervical: No cervical adenopathy.  Skin:    General: Skin is warm and dry.  Neurological:     Mental Status: He is alert.  Psychiatric:        Speech: Speech normal.        Behavior: Behavior normal. Behavior is cooperative.    Results for orders placed or performed in visit on 01/29/18  POC Influenza A&B(BINAX/QUICKVUE)  Result Value Ref Range   Influenza A, POC Negative Negative   Influenza B, POC Negative Negative   *Note: Due to a large number of results and/or encounters for the requested time period, some results have not been displayed. A complete set of results can be found in Results Review.    Assessment and Plan:   Quang was seen today for cough.  Diagnoses and all orders for this visit:  Upper respiratory tract infection, unspecified type -     POC Influenza A&B(BINAX/QUICKVUE) -     benzonatate (TESSALON) 100 MG capsule; Take 1 capsule (100  mg total) by mouth 3 (three) times daily as needed for cough.  Other orders -     doxycycline (VIBRA-TABS) 100 MG tablet; Take 1 tablet (100 mg total) by mouth 2 (two) times daily.   No red flags on exam.  Despite supportive care, he has not responded to treatment. Will trial Doxycycline for sinusitis, given multiple comorbidities. Discussed taking medications as prescribed. Reviewed return precautions including worsening fever, SOB, worsening cough or other concerns. Push fluids and rest. I recommend that patient follow-up if symptoms worsen or persist despite treatment x 7-10 days, sooner if needed.  I also advised that he inform about antibiotic usage at next coumadin check.   . Reviewed expectations re: course of current medical issues. . Discussed self-management of symptoms. . Outlined signs and symptoms indicating need for more acute intervention. . Patient verbalized understanding and all questions were answered. . See orders for this visit as documented in the electronic medical record. . Patient received an After-Visit Summary.  CMA or LPN served as scribe during this visit. History, Physical, and Plan performed by medical provider. The above documentation has been reviewed and is accurate and complete.   Inda Coke,  PA-C

## 2018-01-29 NOTE — Patient Instructions (Signed)
It was great to see you!  Start oral doxycycline, this is an antibiotic.  Push fluids and get plenty of rest. Please return if you are not improving as expected, or if you have high fevers (>101.5) or difficulty swallowing or worsening productive cough.  Call clinic with questions.  I hope you start feeling better soon!

## 2018-02-03 ENCOUNTER — Other Ambulatory Visit: Payer: Self-pay | Admitting: Internal Medicine

## 2018-02-03 ENCOUNTER — Other Ambulatory Visit: Payer: Self-pay | Admitting: Nurse Practitioner

## 2018-02-05 ENCOUNTER — Encounter: Payer: Self-pay | Admitting: Family

## 2018-02-05 ENCOUNTER — Ambulatory Visit (INDEPENDENT_AMBULATORY_CARE_PROVIDER_SITE_OTHER): Payer: Medicare Other | Admitting: Family

## 2018-02-05 ENCOUNTER — Ambulatory Visit (INDEPENDENT_AMBULATORY_CARE_PROVIDER_SITE_OTHER): Payer: Medicare Other

## 2018-02-05 ENCOUNTER — Telehealth: Payer: Self-pay | Admitting: Internal Medicine

## 2018-02-05 VITALS — BP 118/72 | HR 66 | Temp 97.5°F | Ht 72.0 in | Wt 190.2 lb

## 2018-02-05 DIAGNOSIS — Z5181 Encounter for therapeutic drug level monitoring: Secondary | ICD-10-CM | POA: Diagnosis not present

## 2018-02-05 DIAGNOSIS — I4891 Unspecified atrial fibrillation: Secondary | ICD-10-CM

## 2018-02-05 DIAGNOSIS — M542 Cervicalgia: Secondary | ICD-10-CM | POA: Diagnosis not present

## 2018-02-05 LAB — POCT INR: INR: 2.4 (ref 2.0–3.0)

## 2018-02-05 NOTE — Telephone Encounter (Signed)
New message       *STAT* If patient is at the pharmacy, call can be transferred to refill team.   1. Which medications need to be refilled? (please list name of each medication and dose if known) spirolactone 25mg  2. Which pharmacy/location (including street and city if local pharmacy) is medication to be sent to? [place]  3. Do they need a 30 day or 90 day supply? 30 day----Pt states that he needs medication called in today.  He is out and is leaving in the morning to go out of town.  If there is a problem, please call him today.

## 2018-02-05 NOTE — Progress Notes (Signed)
Corey [name] is a 83 y.o. male with the following history as recorded in EpicCare:  Patient Active Problem List   Diagnosis Date Noted  . Type 2 diabetes mellitus with complication, without long-term current use of insulin ([place]) 11/19/2016  . Chronic right-sided thoracic back pain 08/15/2016  . DDD (degenerative disc disease), thoracic 06/08/2016  . Depression with anxiety 05/31/2016  . 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 Bronchiectasis without acute exacerbation ([place]) 12/31/2015  . Chronic alcoholic hepatitis 80/03/4915  . Obstructive chronic bronchitis without exacerbation ([place]) 11/12/2015  . Spinal stenosis in cervical region 10/02/2015  . Gallbladder sludge 03/02/2015  . Other iron deficiency anemias 11/13/2013  . B12 deficiency anemia 11/13/2013  . Encounter for therapeutic drug monitoring 02/25/2013  . Long term (current) use of anticoagulants 05/12/2012  . [employer] 11/01/2011  . Chronic systolic heart failure ([place]) 08/28/2011  . Vocal cord nodule 05/19/2011  . Lumbar spinal stenosis 03/03/2011  . Seasonal and perennial allergic rhinitis 04/15/2009  . Insomnia 02/11/2009  . Obstructive sleep apnea 11/10/2008  . Hyperlipidemia with target LDL less than 100 02/14/2008  . GERD 02/14/2008  . ESOPHAGEAL STRICTURE 01/28/2008  . Essential hypertension 11/16/2006  . Coronary atherosclerosis 11/16/2006  . Atrial fibrillation ([place]) 11/16/2006    Current Outpatient Medications  Medication Sig Dispense Refill  . albuterol (ACCUNEB) 0.63 MG/3ML nebulizer solution Inhale into the lungs.    Marland Kitchen albuterol (PROVENTIL HFA;VENTOLIN HFA) 108 (90 BASE) MCG/ACT inhaler Inhale 1-2 puffs into the lungs every 6 (six) hours as needed for shortness of breath. 1 Inhaler 11  . amiodarone (PACERONE) 200 MG tablet TAKE ONE-HALF TABLET DAILY 45 tablet 3  . benzonatate (TESSALON) 100 MG capsule Take 1 capsule (100 mg total) by mouth 3 (three) times daily as needed for  cough. 30 capsule 0  . folic acid (FOLVITE) 1 MG tablet TAKE ONE TABLET EACH DAY 90 tablet 1  . levalbuterol (XOPENEX) 1.25 MG/3ML nebulizer solution Inhale into the lungs as needed.     Marland Kitchen levothyroxine (SYNTHROID, LEVOTHROID) 100 MCG tablet Take 1 tablet (100 mcg total) by mouth daily. 90 tablet 0  . metoprolol succinate (TOPROL-XL) 50 MG 24 hr tablet Take 1/2 tablet by mouth once a day 45 tablet 0  . mometasone (NASONEX) 50 MCG/ACT nasal spray 1-2 puffs each nostril once or twice daily if needed (Patient taking differently: as needed. 1-2 puffs each nostril once or twice daily if needed) 17 g 0  . pantoprazole (PROTONIX) 40 MG tablet TAKE ONE TABLET DAILY 90 tablet 0  . rosuvastatin (CRESTOR) 5 MG tablet Take 1 tablet (5 mg total) by mouth at bedtime. 90 tablet 1  . sertraline (ZOLOFT) 100 MG tablet Take 1.5 tablets (150 mg total) by mouth daily. 135 tablet 1  . spironolactone (ALDACTONE) 25 MG tablet TAKE ONE TABLET DAILY 30 tablet 1  . SYNJARDY 05-998 MG TABS     . thiamine (VITAMIN B-1) 100 MG tablet TAKE ONE TABLET EACH DAY 90 tablet 1  . warfarin (COUMADIN) 2.5 MG tablet TAKE AS DIRECTED BY COUMADIN CLINIC 35 tablet 1   No current facility-administered medications for this visit.     Allergies: Altace [ramipril]; Hydrocodone; and Oxycodone  Past Medical History:  Diagnosis Date  . Alcohol abuse, episodic 05/19/2008  . ALLERGIC RHINITIS 04/15/2009  . Allergy    YEAR ROUND,TAKE SHOTS  . Alzheimer's dementia ([place])    beginning with sx  . Anxiety   . Arthritis   . ASTHMA 11/16/2006  sinusitis-    hx ?yeast patch/white patch on vocal cord as per [facility] 02/25/11-   . Atrial fibrillation ([place]) 11/16/2006   remote CHF related to atrial fib with rapid ventricular response over 25 yrs per office note,  . Cancer [facility]) 2002   prostate cancer  . Chronic systolic CHF (congestive heart failure) (HCC)    echo (10/02/12): EF 24-26%, grade 1 diastolic dysfunction, trivial AI, mild MR, mild RVE  .  COLONIC POLYPS, ADENOMATOUS, HX OF 02/14/2008  . CORONARY ARTERY DISEASE 11/16/2006   LHC (07/2011): LAD with luminal irregularities, proximal circumflex 50%, EF 25%  . Depression   . DIVERTICULOSIS, COLON 02/14/2008  . ESOPHAGEAL STRICTURE 01/28/2008  . GERD 02/14/2008  . Headache(784.0)   . HYPERGLYCEMIA 11/16/2006  . HYPERLIPIDEMIA 02/14/2008  . HYPERTENSION 11/16/2006  . ICD (implantable cardiac defibrillator) in place   . INSOMNIA-SLEEP DISORDER-UNSPEC 02/11/2009  . Lesion of vocal cord    bx begine  . Myocardial infarction ([place]) 1993  . NICM (nonischemic cardiomyopathy) ([place])   . OBSTRUCTIVE SLEEP APNEA 11/10/2008  . 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 Pacemaker    St. Jude  . Pneumonia    hx of  . PROSTATE CANCER, HX OF 02/25/2000  . SLEEP APNEA 10/03/2009   LOV Dr [name] 12/12 in EPIC    Moderate per patient- settings ?6/last sleep study years ago  . Sleep apnea   . Stroke ([place])    Tia  . Symptomatic bradycardia, secondary to sinus node dysfunction 07/08/2011   s/p [employer] [facility] Scientific PPM by Dr Sallyanne Kuster, upgraded to [place] ICD 12/13 by Dr Rayann Heman (SJM)  . TRANSIENT ISCHEMIC ATTACKS, HX OF 11/16/2006  . Type 2 diabetes mellitus with complication, without long-term current use of insulin ([place]) 11/19/2016    Past Surgical History:  Procedure Laterality Date  . BACK SURGERY    . BI-VENTRICULAR IMPLANTABLE CARDIOVERTER DEFIBRILLATOR UPGRADE N/A 01/16/2012   Procedure: BI-VENTRICULAR IMPLANTABLE CARDIOVERTER DEFIBRILLATOR UPGRADE;  Surgeon: Thompson Grayer, MD;  Location[facility] C[facility] LAB;  Service: Cardiovascular;  Laterality: N/A;  . CARDIAC CATHETERIZATION     several  . CARDIAC DEFIBRILLATOR PLACEMENT  01/16/12   Upgrade to a biventricular SJM ICD by DR Allred  . COLONOSCOPY    . ESOPHAGOGASTRODUODENOSCOPY     with dilitation  . INSERT / REPLACE / REMOVE PACEMAKER    . KNEE ARTHROSCOPY     2 surgeries right and 1 left  . LEFT AND RIGHT HEART CATHETERIZATION WITH CORONARY ANGIOGRAM  N/A 08/18/2011   Procedure: LEFT AND RIGHT HEART CATHETERIZATION WITH CORONARY ANGIOGRAM;  Surgeon: [name], MD;  Location: [place] CATH LAB;  Service: Cardiovascular;  Laterality: N/A;  . LUMBAR LAMINECTOMY/DECOMPRESSION MICRODISCECTOMY  03/02/2011   Procedure: LUMBAR LAMINECTOMY/DECOMPRESSION MICRODISCECTOMY;  Surgeon: [name], MD;  [facility];  Service: Orthopedics;  Laterality: N/A;  Decompression Lumbar 4 - Lumbar(X-Ray)  . PACEMAKER INSERTION  06/2011   [employer] PPM implanted by Dr Sallyanne Kuster  . PERMANENT PACEMAKER INSERTION Left 07/08/2011   Procedure: PERMANENT PACEMAKER INSERTION;  Surgeon: [name], MD;  Location: [place] CATH LAB;  Service: Cardiovascular;  Laterality: Left;  . PROSTATE SURGERY  2/02   prostate cancer  . TONSILECTOMY, ADENOIDECTOMY, BILATERAL MYRINGOTOMY AND TUBES    . TONSILLECTOMY  as child  . TOTAL KNEE ARTHROPLASTY Left 06/04/2012   Procedure: TOTAL KNEE ARTHROPLASTY;  Surgeon: [name], MD;  [facility];  Service: Orthopedics;  Laterality: Left;  . UVULOPALATOPHARYNGOPLASTY  2013  . vocal cord lesion bx  Family History  Problem Relation Age of Onset  . Heart attack Father   . Heart attack Brother   . Heart disease Maternal Uncle   . Heart attack Maternal Uncle   . Sudden death Daughter 56       unknown  . Autism Son   . Colon cancer Neg Hx   . Esophageal cancer Neg Hx   . Rectal cancer Neg Hx   . Prostate cancer Neg Hx   . Stomach cancer Neg Hx     Social History   Tobacco Use  . Smoking status: Former Smoker    Packs/day: 1.00    Years: 4.00    Pack years: 4.00    Types: Cigarettes    Last attempt to quit: 03/22/1964    Years since quitting: 53.9  . Smokeless tobacco: Never Used  . Tobacco comment: reports smoked socially  Substance Use Topics  . Alcohol use: Yes    Alcohol/week: 35.0 standard drinks    Types: 35 Shots of liquor per week    Comment: 2 mixed drinks daily, some days    Subjective:   Patient presents with 2 week history of left sided neck pain- localized behind right ear; feels like pain is on the side of his head; worse with certain movements; leaving for [place] tomorrow for 2 weeks- just wanted to get checked out; no vision changes;  Was treated for sinus infection last week- only able to take 4-5 days of the Doxycycline- caused nausea;     Objective:  Vitals:   02/05/18 1537  BP: 118/72  Pulse: 66  Temp: (!) 97.5 F (36.4 C)  TempSrc: Oral  SpO2: 96%  Weight: 190 lb 3.2 oz (86.3 kg)  Height: 6' (1.829 m)    General: Well developed, well nourished, in no acute distress  Skin : Warm and dry.  Head: Normocephalic and atraumatic  Eyes: Sclera and conjunctiva clear; pupils round and reactive to light; extraocular movements intact  Ears: External normal; canals clear; tympanic membranes normal  Oropharynx: Pink, supple. No suspicious lesions  Neck: Supple without thyromegaly, adenopathy  Lungs: Respirations unlabored; clear to auscultation bilaterally without wheeze, rales, rhonchi  CVS exam: normal rate and regular rhythm.  Neurologic: Alert and oriented; speech intact; face symmetrical; moves all extremities well; CNII-XII intact without focal deficit   Assessment:  1. Neck pain     Plan:  Limited historian- physical exam is reassuring; notes that he is here mostly because he is leaving for 2 week vacation in [place] tomorrow and wants to be sure no obvious abnormality; ? Muscular source- he defers any medication/ will try applying heat to affected area- has neck wrap; follow-up if symptoms still present once he gets back from his upcoming trip and will update imaging.  No follow-ups on file.  No orders of the defined types were placed in this encounter.   Requested Prescriptions    No prescriptions requested or ordered in this encounter

## 2018-02-05 NOTE — Patient Instructions (Signed)
Description   Continue on same dosage 1 tablet every day except 2 tablets on Thursdays.  Recheck in 3 weeks. Call us with any medication changes or concerns to Coumadin Clinic # [phone].

## 2018-02-15 ENCOUNTER — Telehealth: Payer: Self-pay

## 2018-02-15 NOTE — Telephone Encounter (Signed)
Left message for patient to remind of missed remote transmission.  

## 2018-02-19 ENCOUNTER — Encounter: Payer: Self-pay | Admitting: Cardiology

## 2018-02-19 MED ORDER — SYNJARDY 5-1000 MG PO TABS: 1.0000 | ORAL_TABLET | Freq: Two times a day (BID) | ORAL | 0 refills | Status: DC

## 2018-02-19 NOTE — Telephone Encounter (Signed)
Tried to call pt but vm is full.   Erx has been sent. Closing note.

## 2018-02-19 NOTE — Telephone Encounter (Signed)
Copied from [place] [phone]. Topic: Quick Communication - Rx Refill/Question >> Feb 19, 2018  1:33 PM Margot Ables wrote: Medication: SYNJARDY 05-998 MG TABS - 1 tablet 2/day - pt is visiting [place] and they are requesting refills on medication for pt as pt is out of medication  Has the patient contacted their pharmacy? Yes - pharmacy calling  Preferred Pharmacy (with phone number or street name): [facility], [place], [place] - [place] [phone] (Phone) [phone] (Fax)

## 2018-02-28 ENCOUNTER — Ambulatory Visit (INDEPENDENT_AMBULATORY_CARE_PROVIDER_SITE_OTHER): Payer: Medicare Other | Admitting: Pharmacist

## 2018-02-28 DIAGNOSIS — Z5181 Encounter for therapeutic drug level monitoring: Secondary | ICD-10-CM

## 2018-02-28 DIAGNOSIS — I4891 Unspecified atrial fibrillation: Secondary | ICD-10-CM | POA: Diagnosis not present

## 2018-02-28 LAB — POCT INR: INR: 2.4 (ref 2.0–3.0)

## 2018-02-28 NOTE — Patient Instructions (Signed)
Description   Continue on same dosage 1 tablet every day except 2 tablets on Thursdays.  Recheck in 6 weeks. Call us with any medication changes or concerns to [facility] # [phone].

## 2018-03-07 ENCOUNTER — Encounter: Payer: Medicare Other | Admitting: Internal Medicine

## 2018-03-14 ENCOUNTER — Encounter: Payer: Self-pay | Admitting: Internal Medicine

## 2018-03-14 ENCOUNTER — Ambulatory Visit (INDEPENDENT_AMBULATORY_CARE_PROVIDER_SITE_OTHER): Payer: Medicare Other | Admitting: Internal Medicine

## 2018-03-14 VITALS — BP 112/70 | HR 70 | Ht 72.0 in | Wt 188.4 lb

## 2018-03-14 DIAGNOSIS — I5022 Chronic systolic (congestive) heart failure: Secondary | ICD-10-CM

## 2018-03-14 DIAGNOSIS — I480 Paroxysmal atrial fibrillation: Secondary | ICD-10-CM | POA: Diagnosis not present

## 2018-03-14 DIAGNOSIS — I10 Essential (primary) hypertension: Secondary | ICD-10-CM | POA: Diagnosis not present

## 2018-03-14 NOTE — Progress Notes (Signed)
PCP: Janith Lima, MD   Primary EP: Dr [name] is a 83 y.o. male who presents today for routine electrophysiology followup.  Since last being seen in our clinic, the patient reports doing very well.  Today, he denies symptoms of palpitations, chest pain, shortness of breath,  lower extremity edema, dizziness, presyncope, syncope, or ICD shocks.  The patient is otherwise without complaint today.   Past Medical History:  Diagnosis Date  . Alcohol abuse, episodic 05/19/2008  . ALLERGIC RHINITIS 04/15/2009  . Allergy    YEAR ROUND,TAKE SHOTS  . Alzheimer's dementia ([name])    beginning with sx  . Anxiety   . Arthritis   . ASTHMA 11/16/2006   sinusitis-    hx ?yeast patch/white patch on vocal cord as per [name] 02/25/11-   . Atrial fibrillation ([place]) 11/16/2006   remote CHF related to atrial fib with rapid ventricular response over 25 yrs per office note,  . Cancer [facility]) 2002   prostate cancer  . Chronic systolic CHF (congestive heart failure) (HCC)    echo (10/02/12): EF 88-28%, grade 1 diastolic dysfunction, trivial AI, mild MR, mild RVE  . COLONIC POLYPS, ADENOMATOUS, HX OF 02/14/2008  . CORONARY ARTERY DISEASE 11/16/2006   LHC (07/2011): LAD with luminal irregularities, proximal circumflex 50%, EF 25%  . Depression   . DIVERTICULOSIS, COLON 02/14/2008  . ESOPHAGEAL STRICTURE 01/28/2008  . GERD 02/14/2008  . Headache(784.0)   . HYPERGLYCEMIA 11/16/2006  . HYPERLIPIDEMIA 02/14/2008  . HYPERTENSION 11/16/2006  . ICD (implantable cardiac defibrillator) in place   . INSOMNIA-SLEEP DISORDER-UNSPEC 02/11/2009  . Lesion of vocal cord    bx begine  . Myocardial infarction ([name]) 1993  . NICM (nonischemic cardiomyopathy) ([name])   . OBSTRUCTIVE SLEEP APNEA 11/10/2008  . 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 Pacemaker    St. Jude  . Pneumonia    hx of  . PROSTATE CANCER, HX OF 02/25/2000  . SLEEP APNEA 10/03/2009   LOV Dr [name] 12/12 in EPIC    Moderate per patient- settings  ?6/last sleep study years ago  . Sleep apnea   . Stroke ([name])    Tia  . Symptomatic bradycardia, secondary to sinus node dysfunction 07/08/2011   s/p [facility] Scientific PPM by Dr Sallyanne Kuster, upgraded to [name] ICD 12/13 by Dr Rayann Heman (SJM)  . TRANSIENT ISCHEMIC ATTACKS, HX OF 11/16/2006  . Type 2 diabetes mellitus with complication, without long-term current use of insulin ([name]) 11/19/2016   Past Surgical History:  Procedure Laterality Date  . BACK SURGERY    . BI-VENTRICULAR IMPLANTABLE CARDIOVERTER DEFIBRILLATOR UPGRADE N/A 01/16/2012   Procedure: BI-VENTRICULAR IMPLANTABLE CARDIOVERTER DEFIBRILLATOR UPGRADE;  Surgeon: Thompson Grayer, MD;  Location: [facility] CATH LAB;  Service: Cardiovascular;  Laterality: N/A;  . CARDIAC CATHETERIZATION     several  . CARDIAC DEFIBRILLATOR PLACEMENT  01/16/12   Upgrade to a biventricular SJM ICD by DR Taelyr Jantz  . COLONOSCOPY    . ESOPHAGOGASTRODUODENOSCOPY     with dilitation  . INSERT / REPLACE / REMOVE PACEMAKER    . KNEE ARTHROSCOPY     2 surgeries right and 1 left  . LEFT AND RIGHT HEART CATHETERIZATION WITH CORONARY ANGIOGRAM N/A 08/18/2011   Procedure: LEFT AND RIGHT HEART CATHETERIZATION WITH CORONARY ANGIOGRAM;  Surgeon: [name], MD;  Location: [facility] CATH LAB;  Service: Cardiovascular;  Laterality: N/A;  . LUMBAR LAMINECTOMY/DECOMPRESSION MICRODISCECTOMY  03/02/2011   Procedure: LUMBAR LAMINECTOMY/DECOMPRESSION MICRODISCECTOMY;  Surgeon: [name], MD;  Location: [facility] ORS;  Service: Orthopedics;  Laterality: N/A;  Decompression Lumbar 4 - Lumbar(X-Ray)  . PACEMAKER INSERTION  06/2011   [employer] PPM implanted by Dr Sallyanne Kuster  . PERMANENT PACEMAKER INSERTION Left 07/08/2011   Procedure: PERMANENT PACEMAKER INSERTION;  Surgeon: [name], MD;  Location: [facility] CATH LAB;  Service: Cardiovascular;  Laterality: Left;  . PROSTATE SURGERY  2/02   prostate cancer  . TONSILECTOMY, ADENOIDECTOMY, BILATERAL MYRINGOTOMY AND TUBES    . TONSILLECTOMY  as  child  . TOTAL KNEE ARTHROPLASTY Left 06/04/2012   Procedure: TOTAL KNEE ARTHROPLASTY;  Surgeon: [name], MD;  Location: [facility] ORS;  Service: Orthopedics;  Laterality: Left;  . UVULOPALATOPHARYNGOPLASTY  2013  . vocal cord lesion bx      ROS- all systems are reviewed and negative except as per HPI above  Current Outpatient Medications  Medication Sig Dispense Refill  . albuterol (ACCUNEB) 0.63 MG/3ML nebulizer solution Inhale into the lungs.    Marland Kitchen albuterol (PROVENTIL HFA;VENTOLIN HFA) 108 (90 BASE) MCG/ACT inhaler Inhale 1-2 puffs into the lungs every 6 (six) hours as needed for shortness of breath. 1 Inhaler 11  . amiodarone (PACERONE) 200 MG tablet TAKE ONE-HALF TABLET DAILY 45 tablet 3  . benzonatate (TESSALON) 100 MG capsule Take 1 capsule (100 mg total) by mouth 3 (three) times daily as needed for cough. 30 capsule 0  . folic acid (FOLVITE) 1 MG tablet TAKE ONE TABLET EACH DAY 90 tablet 1  . levalbuterol (XOPENEX) 1.25 MG/3ML nebulizer solution Inhale into the lungs as needed.     Marland Kitchen levothyroxine (SYNTHROID, LEVOTHROID) 100 MCG tablet Take 1 tablet (100 mcg total) by mouth daily. 90 tablet 0  . metoprolol succinate (TOPROL-XL) 50 MG 24 hr tablet Take 1/2 tablet by mouth once a day 45 tablet 0  . mometasone (NASONEX) 50 MCG/ACT nasal spray 1-2 puffs each nostril once or twice daily if needed (Patient taking differently: as needed. 1-2 puffs each nostril once or twice daily if needed) 17 g 0  . pantoprazole (PROTONIX) 40 MG tablet TAKE ONE TABLET DAILY 90 tablet 0  . rosuvastatin (CRESTOR) 5 MG tablet Take 1 tablet (5 mg total) by mouth at bedtime. 90 tablet 1  . sertraline (ZOLOFT) 100 MG tablet Take 1.5 tablets (150 mg total) by mouth daily. 135 tablet 1  . spironolactone (ALDACTONE) 25 MG tablet TAKE ONE TABLET DAILY 30 tablet 1  . SYNJARDY 05-998 MG TABS Take 1 tablet by mouth 2 (two) times daily. 60 tablet 0  . thiamine (VITAMIN B-1) 100 MG tablet TAKE ONE TABLET EACH DAY 90  tablet 1  . warfarin (COUMADIN) 2.5 MG tablet TAKE AS DIRECTED BY COUMADIN CLINIC 35 tablet 1   No current facility-administered medications for this visit.     Physical Exam: Vitals:   03/14/18 1441  BP: 112/70  Pulse: 70  SpO2: 97%  Weight: 188 lb 6.4 oz (85.5 kg)  Height: 6' (1.829 m)    GEN- The patient is well appearing, alert and oriented x 3 today.   Head- normocephalic, atraumatic Eyes-  Sclera clear, conjunctiva pink Ears- hearing intact Oropharynx- clear Lungs- Clear to ausculation bilaterally, normal work of breathing Chest- ICD pocket is well healed Heart- Regular rate and rhythm, no murmurs, rubs or gallops, PMI not laterally displaced GI- soft, NT, ND, + BS Extremities- no clubbing, cyanosis, or edema  ICD interrogation- reviewed in detail today,  See PACEART report  ekg tracing ordered today is personally reviewed and shows AV paced rhythm  Wt  Readings from Last 3 Encounters:  03/14/18 188 lb 6.4 oz (85.5 kg)  02/05/18 190 lb 3.2 oz (86.3 kg)  01/29/18 192 lb 4 oz (87.2 kg)    Assessment and Plan:  1.  Chronic systolic dysfunction euvolemic today Stable on an appropriate medical regimen Normal BiV ICD function See [facility] Art report No changes today enroll in ICM device clinic  2. HTN  Stable No change required today bmet today  3. afib Well controlled with low dose amiodarone check labs today (LFTs, TFTs) AF burden <1 % Consider further reduction in amiodarone on return  4. ETOh Avoidance encouraged  [place] Return in a year  Thompson Grayer MD, [facility] 03/14/2018 2:55 PM

## 2018-03-14 NOTE — Patient Instructions (Signed)
Medication Instructions:  Your physician recommends that you continue on your current medications as directed. Please refer to the Current Medication list given to you today.  * If you need a refill on your cardiac medications before your next appointment, please call your pharmacy.   Labwork: Today: BMET, TSH, Hepatic function panel *We will only notify you of abnormal results, otherwise continue current treatment plan.  Testing/Procedures: None ordered  Follow-Up: You will be enrolled in our ICM clinic.  Sharman Cheek, RN will contact you about this home monitoring.  Your physician wants you to follow-up in: 1 year with Dr. Rayann Heman.  You will receive a reminder letter in the mail two months in advance. If you don't receive a letter, please call our office to schedule the follow-up appointment.  Thank you for choosing [facility]!!   Trinidad Curet, RN [phone]  Any Other Special Instructions Will Be Listed Below (If Applicable).   Low-Sodium Eating Plan Sodium, which is an element that makes up salt, helps you maintain a healthy balance of fluids in your body. Too much sodium can increase your blood pressure and cause fluid and waste to be held in your body. Your health care provider or dietitian may recommend following this plan if you have high blood pressure (hypertension), kidney disease, liver disease, or heart failure. Eating less sodium can help lower your blood pressure, reduce swelling, and protect your heart, liver, and kidneys. What are tips for following this plan? General guidelines  Most people on this plan should limit their sodium intake to 1,500-2,000 mg (milligrams) of sodium each day. Reading food labels   The Nutrition Facts label lists the amount of sodium in one serving of the food. If you eat more than one serving, you must multiply the listed amount of sodium by the number of servings.  Choose foods with less than 140 mg of sodium per  serving.  Avoid foods with 300 mg of sodium or more per serving. Shopping  Look for lower-sodium products, often labeled as "low-sodium" or "no salt added."  Always check the sodium content even if foods are labeled as "unsalted" or "no salt added".  Buy fresh foods. ? Avoid canned foods and premade or frozen meals. ? Avoid canned, cured, or processed meats  Buy breads that have less than 80 mg of sodium per slice. Cooking  Eat more home-cooked food and less restaurant, buffet, and fast food.  Avoid adding salt when cooking. Use salt-free seasonings or herbs instead of table salt or sea salt. Check with your health care provider or pharmacist before using salt substitutes.  Cook with plant-based oils, such as canola, sunflower, or olive oil. Meal planning  When eating at a restaurant, ask that your food be prepared with less salt or no salt, if possible.  Avoid foods that contain MSG (monosodium glutamate). MSG is sometimes added to [place] food, bouillon, and some canned foods. What foods are recommended? The items listed may not be a complete list. Talk with your dietitian about what dietary choices are best for you. Grains Low-sodium cereals, including oats, puffed wheat and rice, and shredded wheat. Low-sodium crackers. Unsalted rice. Unsalted pasta. Low-sodium bread. Whole-grain breads and whole-grain pasta. Vegetables Fresh or frozen vegetables. "No salt added" canned vegetables. "No salt added" tomato sauce and paste. Low-sodium or reduced-sodium tomato and vegetable juice. Fruits Fresh, frozen, or canned fruit. Fruit juice. Meats and other protein foods Fresh or frozen (no salt added) meat, poultry, seafood, and fish. Low-sodium canned  tuna and salmon. Unsalted nuts. Dried peas, beans, and lentils without added salt. Unsalted canned beans. Eggs. Unsalted nut butters. Dairy Milk. Soy milk. Cheese that is naturally low in sodium, such as ricotta cheese, fresh mozzarella, or  Swiss cheese Low-sodium or reduced-sodium cheese. Cream cheese. Yogurt. Fats and oils Unsalted butter. Unsalted margarine with no trans fat. Vegetable oils such as canola or olive oils. Seasonings and other foods Fresh and dried herbs and spices. Salt-free seasonings. Low-sodium mustard and ketchup. Sodium-free salad dressing. Sodium-free light mayonnaise. Fresh or refrigerated horseradish. Lemon juice. Vinegar. Homemade, reduced-sodium, or low-sodium soups. Unsalted popcorn and pretzels. Low-salt or salt-free chips. What foods are not recommended? The items listed may not be a complete list. Talk with your dietitian about what dietary choices are best for you. Grains Instant hot cereals. Bread stuffing, pancake, and biscuit mixes. Croutons. Seasoned rice or pasta mixes. Noodle soup cups. Boxed or frozen macaroni and cheese. Regular salted crackers. Self-rising flour. Vegetables Sauerkraut, pickled vegetables, and relishes. Olives. [place] fries. Onion rings. Regular canned vegetables (not low-sodium or reduced-sodium). Regular canned tomato sauce and paste (not low-sodium or reduced-sodium). Regular tomato and vegetable juice (not low-sodium or reduced-sodium). Frozen vegetables in sauces. Meats and other protein foods Meat or fish that is salted, canned, smoked, spiced, or pickled. Bacon, ham, sausage, hotdogs, corned beef, chipped beef, packaged lunch meats, salt pork, jerky, pickled herring, anchovies, regular canned tuna, sardines, salted nuts. Dairy Processed cheese and cheese spreads. Cheese curds. Blue cheese. Feta cheese. String cheese. Regular cottage cheese. Buttermilk. Canned milk. Fats and oils Salted butter. Regular margarine. Ghee. Bacon fat. Seasonings and other foods Onion salt, garlic salt, seasoned salt, table salt, and sea salt. Canned and packaged gravies. Worcestershire sauce. Tartar sauce. Barbecue sauce. Teriyaki sauce. Soy sauce, including reduced-sodium. Steak sauce. Fish  sauce. Oyster sauce. Cocktail sauce. Horseradish that you find on the shelf. Regular ketchup and mustard. Meat flavorings and tenderizers. Bouillon cubes. Hot sauce and Tabasco sauce. Premade or packaged marinades. Premade or packaged taco seasonings. Relishes. Regular salad dressings. Salsa. Potato and tortilla chips. Corn chips and puffs. Salted popcorn and pretzels. Canned or dried soups. Pizza. Frozen entrees and pot pies. Summary  Eating less sodium can help lower your blood pressure, reduce swelling, and protect your heart, liver, and kidneys.  Most people on this plan should limit their sodium intake to 1,500-2,000 mg (milligrams) of sodium each day.  Canned, boxed, and frozen foods are high in sodium. Restaurant foods, fast foods, and pizza are also very high in sodium. You also get sodium by adding salt to food.  Try to cook at home, eat more fresh fruits and vegetables, and eat less fast food, canned, processed, or prepared foods. This information is not intended to replace advice given to you by your health care provider. Make sure you discuss any questions you have with your health care provider. Document Released: 07/02/2001 Document Revised: 01/04/2016 Document Reviewed: 01/04/2016 Elsevier Interactive Patient Education  2019 [employer].

## 2018-03-15 LAB — BASIC METABOLIC PANEL
BUN/Creatinine Ratio: 11 (ref 10–24)
BUN: 20 mg/dL (ref 8–27)
CALCIUM: 9.7 mg/dL (ref 8.6–10.2)
CO2: 22 mmol/L (ref 20–29)
Chloride: 101 mmol/L (ref 96–106)
Creatinine, Ser: 1.83 mg/dL — ABNORMAL HIGH (ref 0.76–1.27)
GFR calc Af Amer: 39 mL/min/{1.73_m2} — ABNORMAL LOW (ref >59)
GFR calc non Af Amer: 34 mL/min/{1.73_m2} — ABNORMAL LOW (ref >59)
Glucose: 144 mg/dL — ABNORMAL HIGH (ref 65–99)
POTASSIUM: 4.5 mmol/L (ref 3.5–5.2)
SODIUM: 139 mmol/L (ref 134–144)

## 2018-03-15 LAB — HEPATIC FUNCTION PANEL
ALK PHOS: 101 IU/L (ref 39–117)
ALT: 44 IU/L (ref 0–44)
AST: 69 IU/L — ABNORMAL HIGH (ref 0–40)
Albumin: 4.5 g/dL (ref 3.7–4.7)
Bilirubin Total: 0.5 mg/dL (ref 0.0–1.2)
Bilirubin, Direct: 0.18 mg/dL (ref 0.00–0.40)
Total Protein: 7.1 g/dL (ref 6.0–8.5)

## 2018-03-15 LAB — TSH: TSH: 0.177 u[IU]/mL — ABNORMAL LOW (ref 0.450–4.500)

## 2018-03-16 LAB — CUP PACEART INCLINIC DEVICE CHECK
Date Time Interrogation Session: 20200221110138
Implantable Lead Implant Date: 20130614
Implantable Lead Implant Date: 20131223
Implantable Lead Implant Date: 20131223
Implantable Lead Location: 753858
Implantable Lead Location: 753859
Implantable Lead Location: 753860
Implantable Lead Model: 4136
Implantable Lead Serial Number: 29201222
Implantable Pulse Generator Implant Date: 20131223
Pulse Gen Serial Number: 7057550

## 2018-03-18 ENCOUNTER — Emergency Department (HOSPITAL_COMMUNITY)
Admission: EM | Admit: 2018-03-18 | Discharge: 2018-03-19 | Disposition: A | Discharge status: Home / Self Care | Payer: Medicare Other | Attending: Emergency Medicine | Admitting: Emergency Medicine

## 2018-03-18 DIAGNOSIS — G309 Alzheimer's disease, unspecified: Secondary | ICD-10-CM | POA: Diagnosis not present

## 2018-03-18 DIAGNOSIS — I252 Old myocardial infarction: Secondary | ICD-10-CM | POA: Diagnosis not present

## 2018-03-18 DIAGNOSIS — Z87891 Personal history of nicotine dependence: Secondary | ICD-10-CM | POA: Diagnosis not present

## 2018-03-18 DIAGNOSIS — Z950 Presence of cardiac pacemaker: Secondary | ICD-10-CM | POA: Insufficient documentation

## 2018-03-18 DIAGNOSIS — Y9301 Activity, walking, marching and hiking: Secondary | ICD-10-CM | POA: Diagnosis not present

## 2018-03-18 DIAGNOSIS — Z8546 Personal history of malignant neoplasm of prostate: Secondary | ICD-10-CM | POA: Diagnosis not present

## 2018-03-18 DIAGNOSIS — S0993XA Unspecified injury of face, initial encounter: Secondary | ICD-10-CM | POA: Diagnosis present

## 2018-03-18 DIAGNOSIS — Z7901 Long term (current) use of anticoagulants: Secondary | ICD-10-CM | POA: Diagnosis not present

## 2018-03-18 DIAGNOSIS — F0280 Dementia in other diseases classified elsewhere without behavioral disturbance: Secondary | ICD-10-CM | POA: Insufficient documentation

## 2018-03-18 DIAGNOSIS — W01198A Fall on same level from slipping, tripping and stumbling with subsequent striking against other object, initial encounter: Secondary | ICD-10-CM | POA: Insufficient documentation

## 2018-03-18 DIAGNOSIS — Z79899 Other long term (current) drug therapy: Secondary | ICD-10-CM | POA: Diagnosis not present

## 2018-03-18 DIAGNOSIS — S0990XA Unspecified injury of head, initial encounter: Secondary | ICD-10-CM | POA: Diagnosis not present

## 2018-03-18 DIAGNOSIS — I110 Hypertensive heart disease with heart failure: Secondary | ICD-10-CM | POA: Diagnosis not present

## 2018-03-18 DIAGNOSIS — I5022 Chronic systolic (congestive) heart failure: Secondary | ICD-10-CM | POA: Diagnosis not present

## 2018-03-18 DIAGNOSIS — F329 Major depressive disorder, single episode, unspecified: Secondary | ICD-10-CM | POA: Diagnosis not present

## 2018-03-18 DIAGNOSIS — Z23 Encounter for immunization: Secondary | ICD-10-CM | POA: Diagnosis not present

## 2018-03-18 DIAGNOSIS — F419 Anxiety disorder, unspecified: Secondary | ICD-10-CM | POA: Insufficient documentation

## 2018-03-18 DIAGNOSIS — S199XXA Unspecified injury of neck, initial encounter: Secondary | ICD-10-CM | POA: Diagnosis not present

## 2018-03-18 DIAGNOSIS — E119 Type 2 diabetes mellitus without complications: Secondary | ICD-10-CM | POA: Diagnosis not present

## 2018-03-18 DIAGNOSIS — I2510 Atherosclerotic heart disease of native coronary artery without angina pectoris: Secondary | ICD-10-CM | POA: Insufficient documentation

## 2018-03-18 DIAGNOSIS — Y998 Other external cause status: Secondary | ICD-10-CM | POA: Diagnosis not present

## 2018-03-18 DIAGNOSIS — Y929 Unspecified place or not applicable: Secondary | ICD-10-CM | POA: Diagnosis not present

## 2018-03-18 DIAGNOSIS — J45909 Unspecified asthma, uncomplicated: Secondary | ICD-10-CM | POA: Diagnosis not present

## 2018-03-18 DIAGNOSIS — S0083XA Contusion of other part of head, initial encounter: Secondary | ICD-10-CM | POA: Insufficient documentation

## 2018-03-18 DIAGNOSIS — S0511XA Contusion of eyeball and orbital tissues, right eye, initial encounter: Secondary | ICD-10-CM | POA: Diagnosis not present

## 2018-03-19 ENCOUNTER — Emergency Department (HOSPITAL_COMMUNITY): Payer: Medicare Other

## 2018-03-19 ENCOUNTER — Encounter (HOSPITAL_COMMUNITY): Payer: Self-pay | Admitting: *Deleted

## 2018-03-19 ENCOUNTER — Other Ambulatory Visit: Payer: Self-pay

## 2018-03-19 DIAGNOSIS — S0511XA Contusion of eyeball and orbital tissues, right eye, initial encounter: Secondary | ICD-10-CM | POA: Diagnosis not present

## 2018-03-19 DIAGNOSIS — S199XXA Unspecified injury of neck, initial encounter: Secondary | ICD-10-CM | POA: Diagnosis not present

## 2018-03-19 DIAGNOSIS — S0990XA Unspecified injury of head, initial encounter: Secondary | ICD-10-CM | POA: Diagnosis not present

## 2018-03-19 MED ORDER — ACETAMINOPHEN 500 MG PO TABS
1000.0000 mg | ORAL_TABLET | Freq: Once | ORAL | Status: AC
  Administered 2018-03-19: 1000 mg via ORAL
  Filled 2018-03-19: qty 2

## 2018-03-19 MED ORDER — TRAMADOL HCL 50 MG PO TABS: 50.0000 mg | ORAL_TABLET | Freq: Three times a day (TID) | ORAL | 0 refills | Status: DC | PRN

## 2018-03-19 MED ORDER — TETRACAINE HCL 0.5 % OP SOLN
2.0000 [drp] | Freq: Once | OPHTHALMIC | Status: AC
  Administered 2018-03-19: 2 [drp] via OPHTHALMIC
  Filled 2018-03-19: qty 4

## 2018-03-19 MED ORDER — TETANUS-DIPHTH-ACELL PERTUSSIS 5-2.5-18.5 LF-MCG/0.5 IM SUSP
0.5000 mL | Freq: Once | INTRAMUSCULAR | Status: AC
  Administered 2018-03-19: 0.5 mL via INTRAMUSCULAR
  Filled 2018-03-19: qty 0.5

## 2018-03-19 MED ORDER — FLUORESCEIN SODIUM 1 MG OP STRP
1.0000 | ORAL_STRIP | Freq: Once | OPHTHALMIC | Status: AC
  Administered 2018-03-19: 1 via OPHTHALMIC
  Filled 2018-03-19: qty 1

## 2018-03-19 NOTE — Discharge Instructions (Signed)

## 2018-03-19 NOTE — ED Triage Notes (Signed)
The pt is c/o tripping and falling down striking his face onto the concrete swollen with abrasions to his face rt side with swelling and undere his eye  No loc  2 loose teeth curts bruises to both knees

## 2018-03-19 NOTE — ED Provider Notes (Signed)
TIME SEEN: 12:10 AM  CHIEF COMPLAINT: Fall  HPI: Patient is an 83 year old male with history of atrial fibrillation on Coumadin, hypertension, hyperlipidemia, diabetes, TIAs, nonischemic cardiomyopathy status post pacemaker placement who presents to the emergency department after a fall that occurred at 5:30 PM tonight.  States that he was taking a walk outside and started to go downhill.  He states that his feet started moving faster than he wanted and he fell forward.  Struck the right side of his face on the ground.  Thinks he may have lost consciousness for 5 seconds.  Complaining of right-sided facial pain, headache.  States his vision in the right eye has become blurry.  States he thinks this is due to the swelling around the right eye.  Initially after the fall his vision was normal.  No neck or back pain.  No chest or abdominal pain.  No numbness, tingling or focal weakness.  Has abrasions to bilateral knees but is able to ambulate.  Unsure of last tetanus vaccination.  Denies any preceding chest pain, shortness of breath, dizziness that led to his fall.   ROS: See HPI Constitutional: no fever  Eyes: no drainage  ENT: no runny nose   Cardiovascular:  no chest pain  Resp: no SOB  GI: no vomiting GU: no dysuria Integumentary: no rash  Allergy: no hives  Musculoskeletal: no leg swelling  Neurological: no slurred speech ROS otherwise negative  PAST MEDICAL HISTORY/PAST SURGICAL HISTORY:  Past Medical History:  Diagnosis Date  . Alcohol abuse, episodic 05/19/2008  . ALLERGIC RHINITIS 04/15/2009  . Allergy    YEAR ROUND,TAKE SHOTS  . Alzheimer's dementia ([place])    beginning with sx  . Anxiety   . Arthritis   . ASTHMA 11/16/2006   sinusitis-    hx ?yeast patch/white patch on vocal cord as per [facility] 02/25/11-   . Atrial fibrillation ([place]) 11/16/2006   remote CHF related to atrial fib with rapid ventricular response over 25 yrs per office note,  . Cancer [facility]) 2002   prostate cancer  .  Chronic systolic CHF (congestive heart failure) (HCC)    echo (10/02/12): EF 40-98%, grade 1 diastolic dysfunction, trivial AI, mild MR, mild RVE  . COLONIC POLYPS, ADENOMATOUS, HX OF 02/14/2008  . CORONARY ARTERY DISEASE 11/16/2006   LHC (07/2011): LAD with luminal irregularities, proximal circumflex 50%, EF 25%  . Depression   . DIVERTICULOSIS, COLON 02/14/2008  . ESOPHAGEAL STRICTURE 01/28/2008  . GERD 02/14/2008  . Headache(784.0)   . HYPERGLYCEMIA 11/16/2006  . HYPERLIPIDEMIA 02/14/2008  . HYPERTENSION 11/16/2006  . ICD (implantable cardiac defibrillator) in place   . INSOMNIA-SLEEP DISORDER-UNSPEC 02/11/2009  . Lesion of vocal cord    bx begine  . Myocardial infarction ([place]) 1993  . NICM (nonischemic cardiomyopathy) ([place])   . OBSTRUCTIVE SLEEP APNEA 11/10/2008  . 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 Pacemaker    St. Jude  . Pneumonia    hx of  . PROSTATE CANCER, HX OF 02/25/2000  . SLEEP APNEA 10/03/2009   LOV Dr [name] 12/12 in EPIC    Moderate per patient- settings ?6/last sleep study years ago  . Sleep apnea   . Stroke ([place])    Tia  . Symptomatic bradycardia, secondary to sinus node dysfunction 07/08/2011   s/p [facility] PPM by Dr Sallyanne Kuster, upgraded to [place] ICD 12/13 by Dr Rayann Heman ([facility])  . TRANSIENT ISCHEMIC ATTACKS, HX OF 11/16/2006  . Type 2 diabetes mellitus with complication, without long-term current use of  insulin ([place]) 11/19/2016    MEDICATIONS:  Prior to Admission medications   Medication Sig Start Date End Date Taking? Authorizing Provider  albuterol (ACCUNEB) 0.63 MG/3ML nebulizer solution Inhale into the lungs.    [provider]  albuterol (PROVENTIL HFA;VENTOLIN HFA) 108 (90 BASE) MCG/ACT inhaler Inhale 1-2 puffs into the lungs every 6 (six) hours as needed for shortness of breath. 02/04/14   Deneise Lever, MD  amiodarone (PACERONE) 200 MG tablet TAKE ONE-HALF TABLET DAILY 02/13/17   Allred, Jeneen Rinks, MD  benzonatate (TESSALON) 100 MG capsule Take 1  capsule (100 mg total) by mouth 3 (three) times daily as needed for cough. 01/29/18   Inda Coke, PA  folic acid (FOLVITE) 1 MG tablet TAKE ONE TABLET EACH DAY 01/26/18   Janith Lima, MD  levalbuterol Penne Lash) 1.25 MG/3ML nebulizer solution Inhale into the lungs as needed.     [provider]  levothyroxine (SYNTHROID, LEVOTHROID) 100 MCG tablet Take 1 tablet (100 mcg total) by mouth daily. 11/23/17   Janith Lima, MD  metoprolol succinate (TOPROL-XL) 50 MG 24 hr tablet Take 1/2 tablet by mouth once a day 02/05/18   Allred, Jeneen Rinks, MD  mometasone (NASONEX) 50 MCG/ACT nasal spray 1-2 puffs each nostril once or twice daily if needed Patient taking differently: as needed. 1-2 puffs each nostril once or twice daily if needed 01/01/16   Baird Lyons D, MD  pantoprazole (PROTONIX) 40 MG tablet TAKE ONE TABLET DAILY 01/29/18   Armbruster, Carlota Raspberry, MD  rosuvastatin (CRESTOR) 5 MG tablet Take 1 tablet (5 mg total) by mouth at bedtime. 10/29/17   Janith Lima, MD  sertraline (ZOLOFT) 100 MG tablet Take 1.5 tablets (150 mg total) by mouth daily. 11/20/17   Janith Lima, MD  spironolactone (ALDACTONE) 25 MG tablet TAKE ONE TABLET DAILY 02/05/18   Burtis Junes, NP  SYNJARDY 05-998 MG TABS Take 1 tablet by mouth 2 (two) times daily. 02/19/18   Janith Lima, MD  thiamine (VITAMIN B-1) 100 MG tablet TAKE ONE TABLET EACH DAY 10/09/17   Janith Lima, MD  warfarin (COUMADIN) 2.5 MG tablet TAKE AS DIRECTED BY COUMADIN CLINIC 02/05/18   Thompson Grayer, MD    ALLERGIES:  Allergies  Allergen Reactions  . Altace [Ramipril] Cough  . Hydrocodone Other (See Comments)    Severe panic attacks  . Oxycodone Other (See Comments)    Severe panic attacks    SOCIAL HISTORY:  Social History   Tobacco Use  . Smoking status: Former Smoker    Packs/day: 1.00    Years: 4.00    Pack years: 4.00    Types: Cigarettes    Last attempt to quit: 03/22/1964    Years since quitting: 54.0  . Smokeless  tobacco: Never Used  . Tobacco comment: reports smoked socially  Substance Use Topics  . Alcohol use: Yes    Alcohol/week: 35.0 standard drinks    Types: 35 Shots of liquor per week    Comment: 2 mixed drinks daily, some days    FAMILY HISTORY: Family History  Problem Relation Age of Onset  . Heart attack Father   . Heart attack Brother   . Heart disease Maternal Uncle   . Heart attack Maternal Uncle   . Sudden death Daughter 40       unknown  . Autism Son   . Colon cancer Neg Hx   . Esophageal cancer Neg Hx   . Rectal cancer Neg Hx   .  Prostate cancer Neg Hx   . Stomach cancer Neg Hx     EXAM: BP (!) 142/94   Pulse 64   Temp (!) 97.4 F (36.3 C)   Resp 18   Ht 6' (1.829 m)   Wt 83.9 kg   SpO2 99%   BMI 25.09 kg/m  CONSTITUTIONAL: Alert and oriented and responds appropriately to questions. Well-appearing; well-nourished; GCS 15 HEAD: Normocephalic; patient has right periorbital swelling and ecchymosis, multiple facial abrasions to the right side of the face with bony tenderness on exam EYES: Conjunctivae clear, PERRL, EOMI, no hyphema ENT: normal nose; no rhinorrhea; moist mucous membranes; pharynx without lesions noted; no dental injury; no septal hematoma NECK: Supple, no meningismus, no LAD; no midline spinal tenderness, step-off or deformity; trachea midline CARD: RRR; S1 and S2 appreciated; no murmurs, no clicks, no rubs, no gallops RESP: Normal chest excursion without splinting or tachypnea; breath sounds clear and equal bilaterally; no wheezes, no rhonchi, no rales; no hypoxia or respiratory distress CHEST:  chest wall stable, no crepitus or ecchymosis or deformity, nontender to palpation; no flail chest ABD/GI: Normal bowel sounds; non-distended; soft, non-tender, no rebound, no guarding; no ecchymosis or other lesions noted PELVIS:  stable, nontender to palpation BACK:  The back appears normal and is non-tender to palpation, there is no CVA tenderness; no  midline spinal tenderness, step-off or deformity EXT: Normal ROM in all joints; non-tender to palpation; no edema; normal capillary refill; no cyanosis, no bony tenderness or bony deformity of patient's extremities, no joint effusion, compartments are soft, extremities are warm and well-perfused, no ecchymosis, abrasions to bilateral knees, mild bruising to the dorsal left hand SKIN: Normal color for age and race; warm NEURO: Moves all extremities equally, reports normal sensation diffusely, cranial nerves II through XII intact, normal speech PSYCH: The patient's mood and manner are appropriate. Grooming and personal hygiene are appropriate.  MEDICAL DECISION MAKING: Patient here after mechanical fall.  Will obtain CT of the head, face and cervical spine.  Will update tetanus vaccination.  He did drive himself to the emergency department and would like to drive home.  Will give Tylenol for pain but likely discharge with something stronger.  Will perform eye exam and obtain visual acuity.  I suspect the blurry vision is likely secondary to periorbital swelling.  ED PROGRESS: Patient reports some improvement in symptoms after Tylenol.  After fluorescein staining and tetracaine patient reports vision is improved.  No foreign body, corneal ulceration or abrasion noted.   2:25 AM  Pt's CT scan showed no intracranial hemorrhage, fractures.  He remains neurologically intact.  I feel he is safe to be discharged home.  Will discharge with short course of pain medication for symptomatic relief.   At this time, I do not feel there is any life-threatening condition present. I have reviewed and discussed all results (EKG, imaging, lab, urine as appropriate) and exam findings with patient/family. I have reviewed nursing notes and appropriate previous records.  I feel the patient is safe to be discharged home without further emergent workup and can continue workup as an outpatient as needed. Discussed usual and  customary return precautions. Patient/family verbalize understanding and are comfortable with this plan.  Outpatient follow-up has been provided as needed. All questions have been answered.    Kailen Hinkle, Delice Bison, DO 03/19/18 [phone]

## 2018-03-21 ENCOUNTER — Encounter: Payer: Self-pay | Admitting: Internal Medicine

## 2018-03-21 ENCOUNTER — Other Ambulatory Visit (INDEPENDENT_AMBULATORY_CARE_PROVIDER_SITE_OTHER): Payer: Medicare Other

## 2018-03-21 ENCOUNTER — Ambulatory Visit (INDEPENDENT_AMBULATORY_CARE_PROVIDER_SITE_OTHER): Payer: Medicare Other | Admitting: Internal Medicine

## 2018-03-21 VITALS — BP 142/70 | HR 71 | Temp 98.1°F | Ht 72.0 in | Wt 187.2 lb

## 2018-03-21 DIAGNOSIS — I10 Essential (primary) hypertension: Secondary | ICD-10-CM

## 2018-03-21 DIAGNOSIS — E032 Hypothyroidism due to medicaments and other exogenous substances: Secondary | ICD-10-CM | POA: Diagnosis not present

## 2018-03-21 DIAGNOSIS — E118 Type 2 diabetes mellitus with unspecified complications: Secondary | ICD-10-CM

## 2018-03-21 DIAGNOSIS — S0083XS Contusion of other part of head, sequela: Secondary | ICD-10-CM

## 2018-03-21 DIAGNOSIS — K7010 Alcoholic hepatitis without ascites: Secondary | ICD-10-CM

## 2018-03-21 LAB — MICROALBUMIN / CREATININE URINE RATIO
Creatinine,U: 362.6 mg/dL
MICROALB/CREAT RATIO: 2.6 mg/g (ref 0.0–30.0)
Microalb, Ur: 9.4 mg/dL — ABNORMAL HIGH (ref 0.0–1.9)

## 2018-03-21 LAB — COMPREHENSIVE METABOLIC PANEL
ALT: 32 U/L (ref 0–53)
AST: 50 U/L — ABNORMAL HIGH (ref 0–37)
Albumin: 4.4 g/dL (ref 3.5–5.2)
Alkaline Phosphatase: 99 U/L (ref 39–117)
BUN: 23 mg/dL (ref 6–23)
CO2: 26 mEq/L (ref 19–32)
CREATININE: 1.59 mg/dL — AB (ref 0.40–1.50)
Calcium: 9.5 mg/dL (ref 8.4–10.5)
Chloride: 104 mEq/L (ref 96–112)
GFR: 42.07 mL/min — ABNORMAL LOW (ref >60.00)
Glucose, Bld: 103 mg/dL — ABNORMAL HIGH (ref 70–99)
Potassium: 5 mEq/L (ref 3.5–5.1)
Sodium: 139 mEq/L (ref 135–145)
Total Bilirubin: 0.7 mg/dL (ref 0.2–1.2)
Total Protein: 7.6 g/dL (ref 6.0–8.3)

## 2018-03-21 LAB — HEMOGLOBIN A1C: Hgb A1c MFr Bld: 6.1 % (ref 4.6–6.5)

## 2018-03-21 MED ORDER — LEVOTHYROXINE SODIUM 75 MCG PO TABS: 75.0000 ug | ORAL_TABLET | Freq: Every day | ORAL | 0 refills | Status: DC

## 2018-03-21 NOTE — Progress Notes (Signed)
Subjective:  Patient ID: Corey [name], male    DOB: December 22, 1935  Age: 83 y.o. MRN: [id number]  CC: Hypothyroidism   HPI CLEVE PAOLILLO presents for f/up - He recently saw his cardiologist and was found to have a suppressed TSH.  About 4 days ago he was walking in his neighborhood and he fell and injured his right face.  He was seen in the ED and there were no significant injuries.  He is concerned about the appearance of the right side of his face but he denies headache, blurred vision, nausea, vomiting, or paresthesias.  He continues to drink 5 large doses of whiskey nearly every day and sometimes up to 10 whiskey drinks a day.  Outpatient Medications Prior to Visit  Medication Sig Dispense Refill  . albuterol (ACCUNEB) 0.63 MG/3ML nebulizer solution Inhale into the lungs.    Marland Kitchen albuterol (PROVENTIL HFA;VENTOLIN HFA) 108 (90 BASE) MCG/ACT inhaler Inhale 1-2 puffs into the lungs every 6 (six) hours as needed for shortness of breath. 1 Inhaler 11  . amiodarone (PACERONE) 200 MG tablet TAKE ONE-HALF TABLET DAILY 45 tablet 3  . folic acid (FOLVITE) 1 MG tablet TAKE ONE TABLET EACH DAY 90 tablet 1  . levalbuterol (XOPENEX) 1.25 MG/3ML nebulizer solution Inhale into the lungs as needed.     . metoprolol succinate (TOPROL-XL) 50 MG 24 hr tablet Take 1/2 tablet by mouth once a day 45 tablet 0  . mometasone (NASONEX) 50 MCG/ACT nasal spray 1-2 puffs each nostril once or twice daily if needed (Patient taking differently: as needed. 1-2 puffs each nostril once or twice daily if needed) 17 g 0  . pantoprazole (PROTONIX) 40 MG tablet TAKE ONE TABLET DAILY 90 tablet 0  . rosuvastatin (CRESTOR) 5 MG tablet Take 1 tablet (5 mg total) by mouth at bedtime. 90 tablet 1  . sertraline (ZOLOFT) 100 MG tablet Take 1.5 tablets (150 mg total) by mouth daily. 135 tablet 1  . spironolactone (ALDACTONE) 25 MG tablet TAKE ONE TABLET DAILY 30 tablet 1  . SYNJARDY 05-998 MG TABS Take 1 tablet by mouth 2 (two) times  daily. 60 tablet 0  . thiamine (VITAMIN B-1) 100 MG tablet TAKE ONE TABLET EACH DAY 90 tablet 1  . warfarin (COUMADIN) 2.5 MG tablet TAKE AS DIRECTED BY COUMADIN CLINIC 35 tablet 1  . levothyroxine (SYNTHROID, LEVOTHROID) 100 MCG tablet Take 1 tablet (100 mcg total) by mouth daily. 90 tablet 0  . benzonatate (TESSALON) 100 MG capsule Take 1 capsule (100 mg total) by mouth 3 (three) times daily as needed for cough. 30 capsule 0  . traMADol (ULTRAM) 50 MG tablet Take 1 tablet (50 mg total) by mouth every 8 (eight) hours as needed. 10 tablet 0   No facility-administered medications prior to visit.     ROS Review of Systems  Constitutional: Negative for diaphoresis.  HENT: Positive for facial swelling. Negative for nosebleeds, sinus pressure, trouble swallowing and voice change.   Eyes: Negative for photophobia, pain, redness and visual disturbance.  Respiratory: Negative for cough, chest tightness, shortness of breath and wheezing.   Cardiovascular: Negative for chest pain, palpitations and leg swelling.  Gastrointestinal: Negative for abdominal pain, constipation, diarrhea, nausea and vomiting.  Endocrine: Negative.  Negative for cold intolerance and heat intolerance.  Genitourinary: Negative.  Negative for difficulty urinating.  Musculoskeletal: Negative.  Negative for back pain and neck pain.  Skin: Positive for wound. Negative for color change.  Neurological: Negative.  Negative for dizziness, weakness,  light-headedness and headaches.  Hematological: Negative for adenopathy. Does not bruise/bleed easily.  Psychiatric/Behavioral: Positive for confusion, decreased concentration and dysphoric mood. Negative for self-injury, sleep disturbance and suicidal ideas. The patient is not nervous/anxious.     Objective:  BP (!) 142/70 (BP Location: Left Arm, Patient Position: Sitting, Cuff Size: Normal)   Pulse 71   Temp 98.1 F (36.7 C) (Oral)   Ht 6' (1.829 m)   Wt 187 lb 4 oz (84.9 kg)    SpO2 95%   BMI 25.40 kg/m   BP Readings from Last 3 Encounters:  03/21/18 (!) 142/70  03/19/18 (!) 154/83  03/14/18 112/70    Wt Readings from Last 3 Encounters:  03/21/18 187 lb 4 oz (84.9 kg)  03/19/18 185 lb (83.9 kg)  03/14/18 188 lb 6.4 oz (85.5 kg)    Physical Exam Vitals signs reviewed.  Constitutional:      General: He is not in acute distress.    Appearance: He is ill-appearing. He is not toxic-appearing or diaphoretic.  HENT:     Head:      Nose: Rhinorrhea present. No congestion.     Mouth/Throat:     Mouth: Mucous membranes are moist.     Pharynx: Oropharynx is clear. No oropharyngeal exudate or posterior oropharyngeal erythema.  Eyes:     General:        Right eye: No discharge.        Left eye: No discharge.     Extraocular Movements:     Right eye: Normal extraocular motion and no nystagmus.     Left eye: Normal extraocular motion and no nystagmus.     Conjunctiva/sclera:     Right eye: Right conjunctiva is injected. No chemosis, exudate or hemorrhage.    Left eye: Left conjunctiva is not injected. No chemosis, exudate or hemorrhage. Neck:     Musculoskeletal: Normal range of motion and neck supple.  Cardiovascular:     Rate and Rhythm: Normal rate and regular rhythm.     Heart sounds: No murmur. No gallop.   Pulmonary:     Effort: Pulmonary effort is normal. No respiratory distress.     Breath sounds: No stridor. No wheezing, rhonchi or rales.  Abdominal:     General: Bowel sounds are normal.     Palpations: There is no hepatomegaly, splenomegaly or mass.     Tenderness: There is no abdominal tenderness. There is no guarding.  Musculoskeletal: Normal range of motion.        General: No swelling.     Right lower leg: No edema.     Left lower leg: No edema.  Skin:    General: Skin is warm and dry.  Neurological:     General: No focal deficit present.     Mental Status: He is oriented to person, place, and time. Mental status is at baseline.      Lab Results  Component Value Date   WBC 5.1 11/22/2017   HGB 14.0 11/22/2017   HCT 41.2 11/22/2017   PLT 159.0 11/22/2017   GLUCOSE 103 (H) 03/21/2018   CHOL 162 11/22/2017   TRIG 143.0 11/22/2017   HDL 54.50 11/22/2017   LDLCALC 79 11/22/2017   ALT 32 03/21/2018   AST 50 (H) 03/21/2018   NA 139 03/21/2018   K 5.0 03/21/2018   CL 104 03/21/2018   CREATININE 1.59 (H) 03/21/2018   BUN 23 03/21/2018   CO2 26 03/21/2018   TSH 0.177 (L) 03/14/2018  PSA <0.015 05/26/2016   INR 2.4 02/28/2018   HGBA1C 6.1 03/21/2018   MICROALBUR 9.4 (H) 03/21/2018    Ct Head Wo Contrast  Result Date: 03/19/2018 CLINICAL DATA:  83 year old male with facial trauma. EXAM: CT HEAD WITHOUT CONTRAST CT MAXILLOFACIAL WITHOUT CONTRAST CT CERVICAL SPINE WITHOUT CONTRAST TECHNIQUE: Multidetector CT imaging of the head, cervical spine, and maxillofacial structures were performed using the standard protocol without intravenous contrast. Multiplanar CT image reconstructions of the cervical spine and maxillofacial structures were also generated. COMPARISON:  CT of the head dated 04/21/2017 FINDINGS: CT HEAD FINDINGS Brain: There is mild age-related atrophy and chronic microvascular ischemic changes. 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 There is no acute intracranial hemorrhage. No mass effect or midline shift. No extra-axial fluid collection. Vascular: No hyperdense vessel or unexpected calcification. Skull: Normal. Negative for fracture or focal lesion. Other: Minimal right forehead contusion. CT MAXILLOFACIAL FINDINGS Osseous: No acute fracture. No mandibular subluxation. Orbits: The globes and retro-orbital fat are preserved. Sinuses: Diffuse mucoperiosteal thickening of the right maxillary sinus and ethmoid air cells. No air-fluid level. The mastoid air cells are clear. Soft tissues: Right periorbital hematoma. CT CERVICAL SPINE FINDINGS Alignment: No acute subluxation.  Skull base and vertebrae: No acute fracture. No primary bone lesion or focal pathologic process. Soft tissues and spinal canal: No prevertebral fluid or swelling. No visible canal hematoma. Disc levels: Degenerative changes most prominent at C5-C6 and C6-C7. Upper chest: Negative. Other: None IMPRESSION: 1. No acute intracranial hemorrhage. 2. No acute facial bone fractures. 3. Right periorbital hematoma. 4. No acute/traumatic cervical spine pathology. Electronically Signed   By: Anner Crete M.D.   On: 03/19/2018 01:44   Ct Cervical Spine Wo Contrast  Result Date: 03/19/2018 CLINICAL DATA:  83 year old male with facial trauma. EXAM: CT HEAD WITHOUT CONTRAST CT MAXILLOFACIAL WITHOUT CONTRAST CT CERVICAL SPINE WITHOUT CONTRAST TECHNIQUE: Multidetector CT imaging of the head, cervical spine, and maxillofacial structures were performed using the standard protocol without intravenous contrast. Multiplanar CT image reconstructions of the cervical spine and maxillofacial structures were also generated. COMPARISON:  CT of the head dated 04/21/2017 FINDINGS: CT HEAD FINDINGS Brain: There is mild age-related atrophy and chronic microvascular ischemic changes. 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 There is no acute intracranial hemorrhage. No mass effect or midline shift. No extra-axial fluid collection. Vascular: No hyperdense vessel or unexpected calcification. Skull: Normal. Negative for fracture or focal lesion. Other: Minimal right forehead contusion. CT MAXILLOFACIAL FINDINGS Osseous: No acute fracture. No mandibular subluxation. Orbits: The globes and retro-orbital fat are preserved. Sinuses: Diffuse mucoperiosteal thickening of the right maxillary sinus and ethmoid air cells. No air-fluid level. The mastoid air cells are clear. Soft tissues: Right periorbital hematoma. CT CERVICAL SPINE FINDINGS Alignment: No acute subluxation. Skull base and vertebrae: No acute  fracture. No primary bone lesion or focal pathologic process. Soft tissues and spinal canal: No prevertebral fluid or swelling. No visible canal hematoma. Disc levels: Degenerative changes most prominent at C5-C6 and C6-C7. Upper chest: Negative. Other: None IMPRESSION: 1. No acute intracranial hemorrhage. 2. No acute facial bone fractures. 3. Right periorbital hematoma. 4. No acute/traumatic cervical spine pathology. Electronically Signed   By: Anner Crete M.D.   On: 03/19/2018 01:44   Ct Maxillofacial Wo Contrast  Result Date: 03/19/2018 CLINICAL DATA:  83 year old male with facial trauma. EXAM: CT HEAD WITHOUT CONTRAST CT MAXILLOFACIAL WITHOUT CONTRAST CT CERVICAL SPINE WITHOUT CONTRAST TECHNIQUE: Multidetector CT imaging of  the head, cervical spine, and maxillofacial structures were performed using the standard protocol without intravenous contrast. Multiplanar CT image reconstructions of the cervical spine and maxillofacial structures were also generated. COMPARISON:  CT of the head dated 04/21/2017 FINDINGS: CT HEAD FINDINGS Brain: There is mild age-related atrophy and chronic microvascular ischemic changes. 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 There is no acute intracranial hemorrhage. No mass effect or midline shift. No extra-axial fluid collection. Vascular: No hyperdense vessel or unexpected calcification. Skull: Normal. Negative for fracture or focal lesion. Other: Minimal right forehead contusion. CT MAXILLOFACIAL FINDINGS Osseous: No acute fracture. No mandibular subluxation. Orbits: The globes and retro-orbital fat are preserved. Sinuses: Diffuse mucoperiosteal thickening of the right maxillary sinus and ethmoid air cells. No air-fluid level. The mastoid air cells are clear. Soft tissues: Right periorbital hematoma. CT CERVICAL SPINE FINDINGS Alignment: No acute subluxation. Skull base and vertebrae: No acute fracture. No primary bone lesion or focal  pathologic process. Soft tissues and spinal canal: No prevertebral fluid or swelling. No visible canal hematoma. Disc levels: Degenerative changes most prominent at C5-C6 and C6-C7. Upper chest: Negative. Other: None IMPRESSION: 1. No acute intracranial hemorrhage. 2. No acute facial bone fractures. 3. Right periorbital hematoma. 4. No acute/traumatic cervical spine pathology. Electronically Signed   By: Anner Crete M.D.   On: 03/19/2018 01:44    Assessment & Plan:   Carrie was seen today for hypothyroidism.  Diagnoses and all orders for this visit:  Hypothyroidism due to medication- I have asked him to decrease his levothyroxine dose to 75 mcg a day. -     levothyroxine (SYNTHROID, LEVOTHROID) 75 MCG tablet; Take 1 tablet (75 mcg total) by mouth daily.  Essential hypertension- His blood pressure is adequately well controlled.  Electrolytes are normal.  Renal function is stable. -     Comprehensive metabolic panel; Future  Type 2 diabetes mellitus with complication, without long-term current use of insulin ([place])- His blood sugars are well controlled. -     Comprehensive metabolic panel; Future -     Microalbumin / creatinine urine ratio; Future -     Hemoglobin A1c; Future  Chronic alcoholic hepatitis- He was not willing to be admitted into an inpatient or outpatient treatment program for alcoholism.  He agrees to decrease his alcohol intake by 50%.  Facial contusion, sequela- The injured area is healing with no evidence of infections or other complications.   I have discontinued Pierce Crane. Mapp's levothyroxine, benzonatate, and traMADol. I am also having him start on levothyroxine. Additionally, I am having him maintain his albuterol, levalbuterol, albuterol, mometasone, amiodarone, thiamine, rosuvastatin, sertraline, folic acid, pantoprazole, spironolactone, metoprolol succinate, warfarin, and SYNJARDY.  Meds ordered this encounter  Medications  . levothyroxine (SYNTHROID,  LEVOTHROID) 75 MCG tablet    Sig: Take 1 tablet (75 mcg total) by mouth daily.    Dispense:  90 tablet    Refill:  0     Follow-up: Return in about 3 months (around 06/19/2018).  Scarlette Calico, MD

## 2018-03-21 NOTE — Patient Instructions (Signed)
Hypothyroidism  Hypothyroidism is when the thyroid gland does not make enough of certain hormones (it is underactive). The thyroid gland is a small gland located in the lower front part of the neck, just in front of the windpipe (trachea). This gland makes hormones that help control how the body uses food for energy (metabolism) as well as how the heart and brain function. These hormones also play a role in keeping your bones strong. When the thyroid is underactive, it produces too little of the hormones thyroxine (T4) and triiodothyronine (T3). What are the causes? This condition may be caused by:  Hashimoto's disease. This is a disease in which the body's disease-fighting system (immune system) attacks the thyroid gland. This is the most common cause.  Viral infections.  Pregnancy.  Certain medicines.  Birth defects.  Past radiation treatments to the head or neck for cancer.  Past treatment with radioactive iodine.  Past exposure to radiation in the environment.  Past surgical removal of part or all of the thyroid.  Problems with a gland in the center of the brain (pituitary gland).  Lack of enough iodine in the diet. What increases the risk? You are more likely to develop this condition if:  You are male.  You have a family history of thyroid conditions.  You use a medicine called lithium.  You take medicines that affect the immune system (immunosuppressants). What are the signs or symptoms? Symptoms of this condition include:  Feeling as though you have no energy (lethargy).  Not being able to tolerate cold.  Weight gain that is not explained by a change in diet or exercise habits.  Lack of appetite.  Dry skin.  Coarse hair.  Menstrual irregularity.  Slowing of thought processes.  Constipation.  Sadness or depression. How is this diagnosed? This condition may be diagnosed based on:  Your symptoms, your medical history, and a physical exam.  Blood  tests. You may also have imaging tests, such as an ultrasound or MRI. How is this treated? This condition is treated with medicine that replaces the thyroid hormones that your body does not make. After you begin treatment, it may take several weeks for symptoms to go away. Follow these instructions at home:  Take over-the-counter and prescription medicines only as told by your health care provider.  If you start taking any new medicines, tell your health care provider.  Keep all follow-up visits as told by your health care provider. This is important. ? As your condition improves, your dosage of thyroid hormone medicine may change. ? You will need to have blood tests regularly so that your health care provider can monitor your condition. Contact a health care provider if:  Your symptoms do not get better with treatment.  You are taking thyroid replacement medicine and you: ? Sweat a lot. ? Have tremors. ? Feel anxious. ? Lose weight rapidly. ? Cannot tolerate heat. ? Have emotional swings. ? Have diarrhea. ? Feel weak. Get help right away if you have:  Chest pain.  An irregular heartbeat.  A rapid heartbeat.  Difficulty breathing. Summary  Hypothyroidism is when the thyroid gland does not make enough of certain hormones (it is underactive).  When the thyroid is underactive, it produces too little of the hormones thyroxine (T4) and triiodothyronine (T3).  The most common cause is Hashimoto's disease, a disease in which the body's disease-fighting system (immune system) attacks the thyroid gland. The condition can also be caused by viral infections, medicine, pregnancy, or past   radiation treatment to the head or neck.  Symptoms may include weight gain, dry skin, constipation, feeling as though you do not have energy, and not being able to tolerate cold.  This condition is treated with medicine to replace the thyroid hormones that your body does not make. This information  is not intended to replace advice given to you by your health care provider. Make sure you discuss any questions you have with your health care provider. Document Released: 01/10/2005 Document Revised: 12/21/2016 Document Reviewed: 12/21/2016 Elsevier Interactive Patient Education  2019 Elsevier Inc.  

## 2018-03-22 ENCOUNTER — Encounter: Payer: Self-pay | Admitting: Internal Medicine

## 2018-03-23 ENCOUNTER — Other Ambulatory Visit: Payer: Self-pay | Admitting: Internal Medicine

## 2018-03-26 ENCOUNTER — Other Ambulatory Visit: Payer: Self-pay | Admitting: Internal Medicine

## 2018-03-28 ENCOUNTER — Ambulatory Visit: Payer: Medicare Other | Admitting: Internal Medicine

## 2018-04-02 DIAGNOSIS — F4323 Adjustment disorder with mixed anxiety and depressed mood: Secondary | ICD-10-CM | POA: Diagnosis not present

## 2018-04-05 ENCOUNTER — Ambulatory Visit: Payer: Medicare Other | Admitting: Internal Medicine

## 2018-04-07 ENCOUNTER — Other Ambulatory Visit: Payer: Self-pay | Admitting: Nurse Practitioner

## 2018-04-13 ENCOUNTER — Telehealth: Payer: Self-pay

## 2018-04-13 NOTE — Telephone Encounter (Signed)
Referred to ICM clinic by Dr Rayann Heman.   Patient had previously been in [facility] clinic but no remote transmissions have been received since 08/31/2017. Attempted call to patient for ICM intro and no answer or answering machine.

## 2018-04-16 ENCOUNTER — Telehealth: Payer: Self-pay

## 2018-04-16 NOTE — Telephone Encounter (Signed)
Called to prescreen and make pt aware of the drive thru clinic and the pt stated that due to the covid they wish to cancel their appt and call back to schedule it at a later time

## 2018-04-23 ENCOUNTER — Other Ambulatory Visit: Payer: Self-pay | Admitting: Pharmacist Clinician (PhC)/ Clinical Pharmacy Specialist

## 2018-04-23 ENCOUNTER — Telehealth: Payer: Self-pay | Admitting: Internal Medicine

## 2018-04-23 ENCOUNTER — Other Ambulatory Visit: Payer: Self-pay | Admitting: Internal Medicine

## 2018-04-23 MED ORDER — WARFARIN SODIUM 2.5 MG PO TABS: ORAL_TABLET | ORAL | 1 refills | Status: DC

## 2018-04-23 NOTE — Telephone Encounter (Signed)
  Patient has made appt in the morning 8:45 for the coumadin clinic. He would like to come today if at all possible. He has been out of warfarin for four days now and is concerned that it has been too long. Please call

## 2018-04-23 NOTE — Telephone Encounter (Signed)
Patient ran out of med while travelling.  Will refill and move appt 1 week to let him get back to therapeutic.  Did not see possible DOAC info until patient hung up phone

## 2018-04-26 ENCOUNTER — Telehealth: Payer: Self-pay

## 2018-04-26 NOTE — Telephone Encounter (Signed)

## 2018-04-30 ENCOUNTER — Ambulatory Visit (INDEPENDENT_AMBULATORY_CARE_PROVIDER_SITE_OTHER): Payer: Medicare Other | Admitting: Pharmacist

## 2018-04-30 ENCOUNTER — Other Ambulatory Visit: Payer: Self-pay

## 2018-04-30 DIAGNOSIS — I4891 Unspecified atrial fibrillation: Secondary | ICD-10-CM

## 2018-04-30 DIAGNOSIS — Z5181 Encounter for therapeutic drug level monitoring: Secondary | ICD-10-CM | POA: Diagnosis not present

## 2018-04-30 LAB — POCT INR: INR: 4.9 — AB (ref 2.0–3.0)

## 2018-05-01 ENCOUNTER — Other Ambulatory Visit: Payer: Self-pay | Admitting: Internal Medicine

## 2018-05-01 DIAGNOSIS — E785 Hyperlipidemia, unspecified: Secondary | ICD-10-CM

## 2018-05-01 DIAGNOSIS — E118 Type 2 diabetes mellitus with unspecified complications: Secondary | ICD-10-CM

## 2018-05-01 DIAGNOSIS — I2510 Atherosclerotic heart disease of native coronary artery without angina pectoris: Secondary | ICD-10-CM

## 2018-05-02 DIAGNOSIS — C44229 Squamous cell carcinoma of skin of left ear and external auricular canal: Secondary | ICD-10-CM | POA: Diagnosis not present

## 2018-05-02 DIAGNOSIS — D1801 Hemangioma of skin and subcutaneous tissue: Secondary | ICD-10-CM | POA: Diagnosis not present

## 2018-05-02 DIAGNOSIS — L570 Actinic keratosis: Secondary | ICD-10-CM | POA: Diagnosis not present

## 2018-05-02 DIAGNOSIS — C44529 Squamous cell carcinoma of skin of other part of trunk: Secondary | ICD-10-CM | POA: Diagnosis not present

## 2018-05-02 DIAGNOSIS — Z85828 Personal history of other malignant neoplasm of skin: Secondary | ICD-10-CM | POA: Diagnosis not present

## 2018-05-02 DIAGNOSIS — L821 Other seborrheic keratosis: Secondary | ICD-10-CM | POA: Diagnosis not present

## 2018-05-07 ENCOUNTER — Other Ambulatory Visit: Payer: Self-pay | Admitting: Gastroenterology

## 2018-05-07 ENCOUNTER — Encounter: Payer: Medicare Other | Admitting: Psychology

## 2018-05-08 ENCOUNTER — Other Ambulatory Visit: Payer: Self-pay

## 2018-05-08 ENCOUNTER — Other Ambulatory Visit: Payer: Self-pay | Admitting: Gastroenterology

## 2018-05-08 MED ORDER — PANTOPRAZOLE SODIUM 40 MG PO TBEC: 40.0000 mg | DELAYED_RELEASE_TABLET | Freq: Every day | ORAL | 0 refills | Status: DC

## 2018-05-09 ENCOUNTER — Other Ambulatory Visit: Payer: Self-pay

## 2018-05-10 ENCOUNTER — Other Ambulatory Visit: Payer: Self-pay

## 2018-05-10 ENCOUNTER — Ambulatory Visit (INDEPENDENT_AMBULATORY_CARE_PROVIDER_SITE_OTHER): Payer: Medicare Other | Admitting: Gastroenterology

## 2018-05-10 VITALS — Ht 72.0 in | Wt 177.0 lb

## 2018-05-10 DIAGNOSIS — I2510 Atherosclerotic heart disease of native coronary artery without angina pectoris: Secondary | ICD-10-CM | POA: Diagnosis not present

## 2018-05-10 DIAGNOSIS — K709 Alcoholic liver disease, unspecified: Secondary | ICD-10-CM | POA: Diagnosis not present

## 2018-05-10 DIAGNOSIS — R194 Change in bowel habit: Secondary | ICD-10-CM

## 2018-05-10 NOTE — Progress Notes (Addendum)
THIS ENCOUNTER IS A VIRTUAL VISIT DUE TO COVID-19 - PATIENT WAS NOT SEEN IN THE OFFICE. PATIENT HAS CONSENTED TO VIRTUAL VISIT / TELEMEDICINE VISIT. PATIENT COULD NOT RESPOND TO DOXIMITY APP, COULD NOT GET VISUAL CAPABILITY WORKING BUT WAS ABLE TO SPEAK WITH ME OVER THE PHONE   Location of patient: home Location of provider: office Persons participating: myself, patient, patient's wife Total time 22 minutes   HPI :  83 y/o male here for a follow up visit, I have seen him in the past for alcoholic liver disease (fatty liver, but no confirmed cirrhosis). Last seen in 04/2017. Spoke with both the patient and his wife today. He reports drinking heavily lately, about a "5th per night". He reports he has had some falls, he thinks at least one time due to alcohol use. Balance has been poor. He denies any swelling of the legs or abdomen. He thinks maybe dropped a few lbs lately from baseline. Wife reports he is eating about one meal per day, lies in bed until noon and then naps more in the afternoon. He remains on Coumadin for history of Atrial fibb. Alcohol use has gone back up recently over several months. He is hoping to quit drinking "as of today" due to a "rough night" last night.  Wife got on the phone to speak with me as well about this. Bowels have been irregular for the past year. Loose stools tend to predominate. He is having 2-3 BMs per day with some urgency. He has been on the metformin for about a year, wife thinks it is related.   Last LFTs done on 03/21/18, AST of 50, ALT of 32, AP 99, T bil 0.7. CBC done on 11/22/2017 shows Hgb of 14.0, plt 159. LFTs have ranged from ALT 50-60s over the past year. Back in Feb 2019 the AST was elevated to 205, peaked, and we had discussed the need to completely stop alcohol use. He never has had withdrawal in the past. He has declined counseling in the past and continues to do so.  Prior workup for issues in the past: EGD 06/18/14 - bleeding small gastric  polyps removed EGD 01/22/14 - bleeding large gastric polyp removed Colonoscopy 1/15 - severe diverticulosis left colon, hemorrhoids Flex sig 12/2013 - severe left sided diverticulosis, hemorrhoids CT abdomen 10/05/14 - normal liver, GB, pancreas. Diverticulosis noted US abdomen 05/30/14 - no gallstones, normal GB, fatty liver, normal spleen US 06/17/16 - diffuse hepatic steatosis, no cirrhosis   Past Medical History:  Diagnosis Date  . Alcohol abuse, episodic 05/19/2008  . ALLERGIC RHINITIS 04/15/2009  . Allergy    YEAR ROUND,TAKE SHOTS  . Alzheimer's dementia ([place])    beginning with sx  . Anxiety   . Arthritis   . ASTHMA 11/16/2006   sinusitis-    hx ?yeast patch/white patch on vocal cord as per [facility] 02/25/11-   . Atrial fibrillation ([place]) 11/16/2006   remote CHF related to atrial fib with rapid ventricular response over 25 yrs per office note,  . Cancer [facility]) 2002   prostate cancer  . Chronic systolic CHF (congestive heart failure) (HCC)    echo (10/02/12): EF 44-81%, grade 1 diastolic dysfunction, trivial AI, mild MR, mild RVE  . COLONIC POLYPS, ADENOMATOUS, HX OF 02/14/2008  . CORONARY ARTERY DISEASE 11/16/2006   LHC (07/2011): LAD with luminal irregularities, proximal circumflex 50%, EF 25%  . Depression   . DIVERTICULOSIS, COLON 02/14/2008  . ESOPHAGEAL STRICTURE 01/28/2008  . GERD 02/14/2008  .  Headache(784.0)   . HYPERGLYCEMIA 11/16/2006  . HYPERLIPIDEMIA 02/14/2008  . HYPERTENSION 11/16/2006  . ICD (implantable cardiac defibrillator) in place   . INSOMNIA-SLEEP DISORDER-UNSPEC 02/11/2009  . Lesion of vocal cord    bx begine  . Myocardial infarction ([place]) 1993  . NICM (nonischemic cardiomyopathy) ([place])   . OBSTRUCTIVE SLEEP APNEA 11/10/2008  . 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 Pacemaker    St. Jude  . Pneumonia    hx of  . PROSTATE CANCER, HX OF 02/25/2000  . SLEEP APNEA 10/03/2009   LOV Dr [name] 12/12 in EPIC    Moderate per patient- settings ?6/last sleep study years  ago  . Sleep apnea   . Stroke ([place])    Tia  . Symptomatic bradycardia, secondary to sinus node dysfunction 07/08/2011   s/p [facility] Scientific PPM by Dr Sallyanne Kuster, upgraded to [place] ICD 12/13 by Dr Rayann Heman (SJM)  . TRANSIENT ISCHEMIC ATTACKS, HX OF 11/16/2006  . Type 2 diabetes mellitus with complication, without long-term current use of insulin ([place]) 11/19/2016     Past Surgical History:  Procedure Laterality Date  . BACK SURGERY    . BI-VENTRICULAR IMPLANTABLE CARDIOVERTER DEFIBRILLATOR UPGRADE N/A 01/16/2012   Procedure: BI-VENTRICULAR IMPLANTABLE CARDIOVERTER DEFIBRILLATOR UPGRADE;  Surgeon: Thompson Grayer, MD;  Location: [facility] CATH LAB;  Service: Cardiovascular;  Laterality: N/A;  . CARDIAC CATHETERIZATION     several  . CARDIAC DEFIBRILLATOR PLACEMENT  01/16/12   Upgrade to a biventricular SJM ICD by DR Allred  . COLONOSCOPY    . ESOPHAGOGASTRODUODENOSCOPY     with dilitation  . INSERT / REPLACE / REMOVE PACEMAKER    . KNEE ARTHROSCOPY     2 surgeries right and 1 left  . LEFT AND RIGHT HEART CATHETERIZATION WITH CORONARY ANGIOGRAM N/A 08/18/2011   Procedure: LEFT AND RIGHT HEART CATHETERIZATION WITH CORONARY ANGIOGRAM;  Surgeon: [name], MD;  Location: [place] CATH LAB;  Service: Cardiovascular;  Laterality: N/A;  . LUMBAR LAMINECTOMY/DECOMPRESSION MICRODISCECTOMY  03/02/2011   Procedure: LUMBAR LAMINECTOMY/DECOMPRESSION MICRODISCECTOMY;  Surgeon: [name], MD;  Location: [facility] ORS;  Service: Orthopedics;  Laterality: N/A;  Decompression Lumbar 4 - Lumbar(X-Ray)  . PACEMAKER INSERTION  06/2011   [employer] PPM implanted by Dr Sallyanne Kuster  . PERMANENT PACEMAKER INSERTION Left 07/08/2011   Procedure: PERMANENT PACEMAKER INSERTION;  Surgeon: [name], MD;  Location: [place] CATH LAB;  Service: Cardiovascular;  Laterality: Left;  . PROSTATE SURGERY  2/02   prostate cancer  . TONSILECTOMY, ADENOIDECTOMY, BILATERAL MYRINGOTOMY AND TUBES    . TONSILLECTOMY  as child  . TOTAL KNEE  ARTHROPLASTY Left 06/04/2012   Procedure: TOTAL KNEE ARTHROPLASTY;  Surgeon: [name], MD;  Location: [facility] ORS;  Service: Orthopedics;  Laterality: Left;  . UVULOPALATOPHARYNGOPLASTY  2013  . vocal cord lesion bx     Family History  Problem Relation Age of Onset  . Heart attack Father   . Heart attack Brother   . Heart disease Maternal Uncle   . Heart attack Maternal Uncle   . Sudden death Daughter 67       unknown  . Autism Son   . Colon cancer Neg Hx   . Esophageal cancer Neg Hx   . Rectal cancer Neg Hx   . Prostate cancer Neg Hx   . Stomach cancer Neg Hx    Social History   Tobacco Use  . Smoking status: Former Smoker    Packs/day: 1.00    Years: 4.00    Pack years: 4.00  Types: Cigarettes    Last attempt to quit: 03/22/1964    Years since quitting: 54.1  . Smokeless tobacco: Never Used  . Tobacco comment: reports smoked socially  Substance Use Topics  . Alcohol use: Yes    Alcohol/week: 35.0 standard drinks    Types: 35 Shots of liquor per week    Comment: 2 mixed drinks daily, some days  . Drug use: No   Current Outpatient Medications  Medication Sig Dispense Refill  . albuterol (ACCUNEB) 0.63 MG/3ML nebulizer solution Inhale into the lungs.    Marland Kitchen albuterol (PROVENTIL HFA;VENTOLIN HFA) 108 (90 BASE) MCG/ACT inhaler Inhale 1-2 puffs into the lungs every 6 (six) hours as needed for shortness of breath. 1 Inhaler 11  . amiodarone (PACERONE) 200 MG tablet TAKE ONE-HALF TABLET DAILY 45 tablet 3  . folic acid (FOLVITE) 1 MG tablet TAKE ONE TABLET EACH DAY 90 tablet 1  . levalbuterol (XOPENEX) 1.25 MG/3ML nebulizer solution Inhale into the lungs as needed.     Marland Kitchen levothyroxine (SYNTHROID, LEVOTHROID) 75 MCG tablet Take 1 tablet (75 mcg total) by mouth daily. 90 tablet 0  . metoprolol succinate (TOPROL-XL) 50 MG 24 hr tablet Take 1/2 tablet by mouth once a day 45 tablet 0  . mometasone (NASONEX) 50 MCG/ACT nasal spray 1-2 puffs each nostril once or twice daily if  needed (Patient taking differently: as needed. 1-2 puffs each nostril once or twice daily if needed) 17 g 0  . pantoprazole (PROTONIX) 40 MG tablet Take 1 tablet (40 mg total) by mouth daily. 90 tablet 0  . rosuvastatin (CRESTOR) 5 MG tablet TAKE ONE TABLET AT BEDTIME 90 tablet 1  . sertraline (ZOLOFT) 100 MG tablet Take 1.5 tablets (150 mg total) by mouth daily. 135 tablet 1  . spironolactone (ALDACTONE) 25 MG tablet TAKE ONE TABLET DAILY 30 tablet 1  . SYNJARDY 05-998 MG TABS TAKE ONE TABLET TWICE DAILY 180 tablet 1  . thiamine (VITAMIN B-1) 100 MG tablet TAKE ONE TABLET EACH DAY 90 tablet 1  . warfarin (COUMADIN) 2.5 MG tablet Take 1-2 tablets by mouth daily as directed by coumadin clinic 40 tablet 1   No current facility-administered medications for this visit.    Allergies  Allergen Reactions  . Altace [Ramipril] Cough  . Hydrocodone Other (See Comments)    Severe panic attacks  . Oxycodone Other (See Comments)    Severe panic attacks     Review of Systems: All systems reviewed and negative except where noted in HPI.   Lab Results  Component Value Date   ALT 32 03/21/2018   AST 50 (H) 03/21/2018   ALKPHOS 99 03/21/2018   BILITOT 0.7 03/21/2018    Lab Results  Component Value Date   CREATININE 1.59 (H) 03/21/2018   BUN 23 03/21/2018   NA 139 03/21/2018   K 5.0 03/21/2018   CL 104 03/21/2018   CO2 26 03/21/2018    Lab Results  Component Value Date   WBC 5.1 11/22/2017   HGB 14.0 11/22/2017   HCT 41.2 11/22/2017   MCV 91.0 11/22/2017   PLT 159.0 11/22/2017     Physical Exam: Ht 6' (1.829 m) Comment: pt provided over the phone  Wt 177 lb (80.3 kg) Comment: pt provided over the phone  BMI 24.01 kg/m   NA  ASSESSMENT AND PLAN:  83 y/o male here for reassessment of the following issues:  Alcoholic liver disease - fatty liver on imaging in 2018 without cirrhosis, his serologic workup previously  negative. He continues to have a mild AST > ALT elevation due  to alcohol. While he may not have overt cirrhosis yet (platelets remain normal), it is quite possible he may develop this if he does not cut back on his intake. He has increased his intake lately, not eating well, sleeping during the day, etc. I spoke with his wife about my concerns about this. Patient has refused counseling assistance with this and continues to do so. He reports he is motivated to quit alcohol but has said that to me in the past and not able to do so. Wife concerned, understands the need for complete abstinence, again patient declines help, also declines something to help prevent withdrawal. He should follow up with me in 6 months, consider [place] to reassess for cirrhosis at that time. Otherwise hopefully he can replace his caloric intake with food as he quits alcohol.   Change in bowel habits - loose stools with urgency since I have last seen him which correlates to when he started [place] for diabetes. Loose stool could be related to metformin component of this. Recommend he try stopping it for a few days and see if bowels improve, he should touch base with Dr. Ronnald Ramp about this who manages his diabetes, he agreed. Call if symptoms persist. He may wish to take a fiber supplement otherwise.   Spring[place] Cellar, MD [facility]

## 2018-05-14 ENCOUNTER — Encounter: Payer: Self-pay | Admitting: Internal Medicine

## 2018-05-14 ENCOUNTER — Other Ambulatory Visit: Payer: Self-pay

## 2018-05-14 ENCOUNTER — Ambulatory Visit (INDEPENDENT_AMBULATORY_CARE_PROVIDER_SITE_OTHER): Payer: Medicare Other | Admitting: Internal Medicine

## 2018-05-14 ENCOUNTER — Telehealth: Payer: Self-pay

## 2018-05-14 VITALS — BP 112/58 | HR 54 | Temp 97.7°F | Resp 16 | Ht 72.0 in | Wt 178.2 lb

## 2018-05-14 DIAGNOSIS — E118 Type 2 diabetes mellitus with unspecified complications: Secondary | ICD-10-CM

## 2018-05-14 DIAGNOSIS — I4891 Unspecified atrial fibrillation: Secondary | ICD-10-CM

## 2018-05-14 DIAGNOSIS — I2510 Atherosclerotic heart disease of native coronary artery without angina pectoris: Secondary | ICD-10-CM | POA: Diagnosis not present

## 2018-05-14 DIAGNOSIS — I10 Essential (primary) hypertension: Secondary | ICD-10-CM

## 2018-05-14 MED ORDER — METFORMIN HCL ER 500 MG PO TB24: 1000.0000 mg | ORAL_TABLET | Freq: Every day | ORAL | 1 refills | Status: DC

## 2018-05-14 NOTE — Patient Instructions (Signed)
Type 2 Diabetes Mellitus, Diagnosis, Adult Type 2 diabetes (type 2 diabetes mellitus) is a long-term (chronic) disease. In type 2 diabetes, one or both of these problems may be present:  The pancreas does not make enough of a hormone called insulin.  Cells in the body do not respond properly to insulin that the body makes (insulin resistance). Normally, insulin allows blood sugar (glucose) to enter cells in the body. The cells use glucose for energy. Insulin resistance or lack of insulin causes excess glucose to build up in the blood instead of going into cells. As a result, high blood glucose (hyperglycemia) develops. What increases the risk? The following factors may make you more likely to develop type 2 diabetes:  Having a family member with type 2 diabetes.  Being overweight or obese.  Having an inactive (sedentary) lifestyle.  Having been diagnosed with insulin resistance.  Having a history of prediabetes, gestational diabetes, or polycystic ovary syndrome (PCOS).  Being of American-Indian, African-American, Hispanic/Latino, or Asian/Pacific Islander descent. What are the signs or symptoms? In the early stage of this condition, you may not have symptoms. Symptoms develop slowly and may include:  Increased thirst (polydipsia).  Increased hunger(polyphagia).  Increased urination (polyuria).  Increased urination during the night (nocturia).  Unexplained weight loss.  Frequent infections that keep coming back (recurring).  Fatigue.  Weakness.  Vision changes, such as blurry vision.  Cuts or bruises that are slow to heal.  Tingling or numbness in the hands or feet.  Dark patches on the skin (acanthosis nigricans). How is this diagnosed? This condition is diagnosed based on your symptoms, your medical history, a physical exam, and your blood glucose level. Your blood glucose may be checked with one or more of the following blood tests:  A fasting blood glucose (FBG)  test. You will not be allowed to eat (you will fast) for 8 hours or longer before a blood sample is taken.  A random blood glucose test. This test checks blood glucose at any time of day regardless of when you ate.  An A1c (hemoglobin A1c) blood test. This test provides information about blood glucose control over the previous 2-3 months.  An oral glucose tolerance test (OGTT). This test measures your blood glucose at two times: ? After fasting. This is your baseline blood glucose level. ? Two hours after drinking a beverage that contains glucose. You may be diagnosed with type 2 diabetes if:  Your FBG level is 126 mg/dL (7.0 mmol/L) or higher.  Your random blood glucose level is 200 mg/dL (11.1 mmol/L) or higher.  Your A1c level is 6.5% or higher.  Your OGTT result is higher than 200 mg/dL (11.1 mmol/L). These blood tests may be repeated to confirm your diagnosis. How is this treated? Your treatment may be managed by a specialist called an endocrinologist. Type 2 diabetes may be treated by following instructions from your health care provider about:  Making diet and lifestyle changes. This may include: ? Following an individualized nutrition plan that is developed by a diet and nutrition specialist (registered dietitian). ? Exercising regularly. ? Finding ways to manage stress.  Checking your blood glucose level as often as told.  Taking diabetes medicines or insulin daily. This helps to keep your blood glucose levels in the healthy range. ? If you use insulin, you may need to adjust the dosage depending on how physically active you are and what foods you eat. Your health care provider will tell you how to adjust your dosage.    Taking medicines to help prevent complications from diabetes, such as: ? Aspirin. ? Medicine to lower cholesterol. ? Medicine to control blood pressure. Your health care provider will set individualized treatment goals for you. Your goals will be based on  your age, other medical conditions you have, and how you respond to diabetes treatment. Generally, the goal of treatment is to maintain the following blood glucose levels:  Before meals (preprandial): 80-130 mg/dL (4.4-7.2 mmol/L).  After meals (postprandial): below 180 mg/dL (10 mmol/L).  A1c level: less than 7%. Follow these instructions at home: Questions to ask your health care provider  Consider asking the following questions: ? Do I need to meet with a diabetes educator? ? Where can I find a support group for people with diabetes? ? What equipment will I need to manage my diabetes at home? ? What diabetes medicines do I need, and when should I take them? ? How often do I need to check my blood glucose? ? What number can I call if I have questions? ? When is my next appointment? General instructions  Take over-the-counter and prescription medicines only as told by your health care provider.  Keep all follow-up visits as told by your health care provider. This is important.  For more information about diabetes, visit: ? American Diabetes Association (ADA): www.diabetes.org ? American Association of Diabetes Educators (AADE): www.diabeteseducator.org Contact a health care provider if:  Your blood glucose is at or above 240 mg/dL (13.3 mmol/L) for 2 days in a row.  You have been sick or have had a fever for 2 days or longer, and you are not getting better.  You have any of the following problems for more than 6 hours: ? You cannot eat or drink. ? You have nausea and vomiting. ? You have diarrhea. Get help right away if:  Your blood glucose is lower than 54 mg/dL (3.0 mmol/L).  You become confused or you have trouble thinking clearly.  You have difficulty breathing.  You have moderate or large ketone levels in your urine. Summary  Type 2 diabetes (type 2 diabetes mellitus) is a long-term (chronic) disease. In type 2 diabetes, the pancreas does not make enough of a  hormone called insulin, or cells in the body do not respond properly to insulin that the body makes (insulin resistance).  This condition is treated by making diet and lifestyle changes and taking diabetes medicines or insulin.  Your health care provider will set individualized treatment goals for you. Your goals will be based on your age, other medical conditions you have, and how you respond to diabetes treatment.  Keep all follow-up visits as told by your health care provider. This is important. This information is not intended to replace advice given to you by your health care provider. Make sure you discuss any questions you have with your health care provider. Document Released: 01/10/2005 Document Revised: 08/11/2016 Document Reviewed: 02/13/2015 Elsevier Interactive Patient Education  2019 Elsevier Inc.  

## 2018-05-14 NOTE — Telephone Encounter (Signed)

## 2018-05-14 NOTE — Progress Notes (Signed)
Subjective:  Patient ID: Corey [name], male    DOB: [birth year]  Age: 83 y.o. MRN: [id number]  CC: Hypertension and Diabetes   HPI Corey [name] presents for f/up - He complains that the combination that the SGLT2 inhibitor and metformin is causing symptoms.  He complains of frequent, urgent, and difficult to control urination.  He also complains that the current dose of metformin is causing diarrhea and stool incontinence.  He has had a slight decrease in his weight and a slight decrease in appetite.  He recently saw GI and has been diagnosed with alcoholic hepatitis.  He is not willing to stop drinking.  He continues to have spontaneous falls.  His last one was about 10 days ago when he injured his left forehead.  He did not experience loss of consciousness and he denies headache, neck pain, blurred vision, or paresthesias.  Outpatient Medications Prior to Visit  Medication Sig Dispense Refill   albuterol (ACCUNEB) 0.63 MG/3ML nebulizer solution Inhale into the lungs.     albuterol (PROVENTIL HFA;VENTOLIN HFA) 108 (90 BASE) MCG/ACT inhaler Inhale 1-2 puffs into the lungs every 6 (six) hours as needed for shortness of breath. 1 Inhaler 11   amiodarone (PACERONE) 200 MG tablet TAKE ONE-HALF TABLET DAILY 45 tablet 3   folic acid (FOLVITE) 1 MG tablet TAKE ONE TABLET EACH DAY 90 tablet 1   levalbuterol (XOPENEX) 1.25 MG/3ML nebulizer solution Inhale into the lungs as needed.      levothyroxine (SYNTHROID, LEVOTHROID) 75 MCG tablet Take 1 tablet (75 mcg total) by mouth daily. 90 tablet 0   metoprolol succinate (TOPROL-XL) 50 MG 24 hr tablet Take 1/2 tablet by mouth once a day 45 tablet 0   mometasone (NASONEX) 50 MCG/ACT nasal spray 1-2 puffs each nostril once or twice daily if needed (Patient taking differently: as needed. 1-2 puffs each nostril once or twice daily if needed) 17 g 0   pantoprazole (PROTONIX) 40 MG tablet Take 1 tablet (40 mg total) by mouth daily. 90 tablet 0    rosuvastatin (CRESTOR) 5 MG tablet TAKE ONE TABLET AT BEDTIME 90 tablet 1   sertraline (ZOLOFT) 100 MG tablet Take 1.5 tablets (150 mg total) by mouth daily. 135 tablet 1   spironolactone (ALDACTONE) 25 MG tablet TAKE ONE TABLET DAILY 30 tablet 1   thiamine (VITAMIN B-1) 100 MG tablet TAKE ONE TABLET EACH DAY 90 tablet 1   warfarin (COUMADIN) 2.5 MG tablet Take 1-2 tablets by mouth daily as directed by coumadin clinic 40 tablet 1   SYNJARDY 05-998 MG TABS TAKE ONE TABLET TWICE DAILY 180 tablet 1   No facility-administered medications prior to visit.     ROS Review of Systems  Constitutional: Positive for appetite change and unexpected weight change. Negative for chills, diaphoresis and fatigue.  HENT: Negative.  Negative for trouble swallowing.   Eyes: Negative for visual disturbance.  Respiratory: Negative for cough, chest tightness, shortness of breath and wheezing.   Cardiovascular: Negative for chest pain, palpitations and leg swelling.  Gastrointestinal: Positive for diarrhea. Negative for abdominal pain, constipation, nausea and vomiting.  Endocrine: Positive for polyuria. Negative for polydipsia and polyphagia.  Genitourinary: Positive for frequency and urgency. Negative for decreased urine volume, difficulty urinating and dysuria.  Musculoskeletal: Negative.  Negative for arthralgias, back pain, myalgias and neck pain.  Skin: Negative.  Negative for color change.  Neurological: Negative for dizziness, weakness and light-headedness.  Hematological: Negative for adenopathy. Does not bruise/bleed easily.  Psychiatric/Behavioral: Negative.  Objective:  BP (!) 112/58 (BP Location: Left Arm, Patient Position: Sitting, Cuff Size: Normal)    Pulse (!) 54    Temp 97.7 F (36.5 C) (Oral)    Resp 16    Ht 6' (1.829 m)    Wt 178 lb 4 oz (80.9 kg)    SpO2 95%    BMI 24.18 kg/m   BP Readings from Last 3 Encounters:  05/14/18 (!) 112/58  03/21/18 (!) 142/70  03/19/18 (!) 154/83     Wt Readings from Last 3 Encounters:  05/14/18 178 lb 4 oz (80.9 kg)  05/10/18 177 lb (80.3 kg)  03/21/18 187 lb 4 oz (84.9 kg)    Physical Exam Vitals signs reviewed.  Constitutional:      General: He is not in acute distress.    Appearance: He is not ill-appearing, toxic-appearing or diaphoretic.  HENT:     Head:      Nose: Nose normal.     Mouth/Throat:     Mouth: Mucous membranes are moist.     Pharynx: Oropharynx is clear. No oropharyngeal exudate or posterior oropharyngeal erythema.  Eyes:     General: No scleral icterus.    Conjunctiva/sclera: Conjunctivae normal.  Neck:     Musculoskeletal: Normal range of motion and neck supple. No neck rigidity or muscular tenderness.  Cardiovascular:     Rate and Rhythm: Normal rate and regular rhythm.     Heart sounds: No murmur.  Pulmonary:     Effort: Pulmonary effort is normal.     Breath sounds: No stridor. No wheezing.  Abdominal:     General: Abdomen is flat.     Palpations: There is no mass.     Tenderness: There is no abdominal tenderness. There is no guarding.  Musculoskeletal: Normal range of motion.        General: No swelling.     Right lower leg: No edema.     Left lower leg: No edema.  Lymphadenopathy:     Cervical: No cervical adenopathy.  Skin:    General: Skin is warm and dry.  Neurological:     General: No focal deficit present.  Psychiatric:        Mood and Affect: Mood normal.     Lab Results  Component Value Date   WBC 5.1 11/22/2017   HGB 14.0 11/22/2017   HCT 41.2 11/22/2017   PLT 159.0 11/22/2017   GLUCOSE 103 (H) 03/21/2018   CHOL 162 11/22/2017   TRIG 143.0 11/22/2017   HDL 54.50 11/22/2017   LDLCALC 79 11/22/2017   ALT 32 03/21/2018   AST 50 (H) 03/21/2018   NA 139 03/21/2018   K 5.0 03/21/2018   CL 104 03/21/2018   CREATININE 1.59 (H) 03/21/2018   BUN 23 03/21/2018   CO2 26 03/21/2018   TSH 0.177 (L) 03/14/2018   PSA <0.015 05/26/2016   INR 4.9 (A) 04/30/2018   HGBA1C  6.1 03/21/2018   MICROALBUR 9.4 (H) 03/21/2018    Ct Head Wo Contrast  Result Date: 03/19/2018 CLINICAL DATA:  83 year old male with facial trauma. EXAM: CT HEAD WITHOUT CONTRAST CT MAXILLOFACIAL WITHOUT CONTRAST CT CERVICAL SPINE WITHOUT CONTRAST TECHNIQUE: Multidetector CT imaging of the head, cervical spine, and maxillofacial structures were performed using the standard protocol without intravenous contrast. Multiplanar CT image reconstructions of the cervical spine and maxillofacial structures were also generated. COMPARISON:  CT of the head dated 04/21/2017 FINDINGS: CT HEAD FINDINGS Brain: There is mild age-related atrophy and chronic microvascular  ischemic changes. 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 There is no acute intracranial hemorrhage. No mass effect or midline shift. No extra-axial fluid collection. Vascular: No hyperdense vessel or unexpected calcification. Skull: Normal. Negative for fracture or focal lesion. Other: Minimal right forehead contusion. CT MAXILLOFACIAL FINDINGS Osseous: No acute fracture. No mandibular subluxation. Orbits: The globes and retro-orbital fat are preserved. Sinuses: Diffuse mucoperiosteal thickening of the right maxillary sinus and ethmoid air cells. No air-fluid level. The mastoid air cells are clear. Soft tissues: Right periorbital hematoma. CT CERVICAL SPINE FINDINGS Alignment: No acute subluxation. Skull base and vertebrae: No acute fracture. No primary bone lesion or focal pathologic process. Soft tissues and spinal canal: No prevertebral fluid or swelling. No visible canal hematoma. Disc levels: Degenerative changes most prominent at C5-C6 and C6-C7. Upper chest: Negative. Other: None IMPRESSION: 1. No acute intracranial hemorrhage. 2. No acute facial bone fractures. 3. Right periorbital hematoma. 4. No acute/traumatic cervical spine pathology. Electronically Signed   By: Anner Crete M.D.   On: 03/19/2018 01:44    Ct Cervical Spine Wo Contrast  Result Date: 03/19/2018 CLINICAL DATA:  83 year old male with facial trauma. EXAM: CT HEAD WITHOUT CONTRAST CT MAXILLOFACIAL WITHOUT CONTRAST CT CERVICAL SPINE WITHOUT CONTRAST TECHNIQUE: Multidetector CT imaging of the head, cervical spine, and maxillofacial structures were performed using the standard protocol without intravenous contrast. Multiplanar CT image reconstructions of the cervical spine and maxillofacial structures were also generated. COMPARISON:  CT of the head dated 04/21/2017 FINDINGS: CT HEAD FINDINGS Brain: There is mild age-related atrophy and chronic microvascular ischemic changes. 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 There is no acute intracranial hemorrhage. No mass effect or midline shift. No extra-axial fluid collection. Vascular: No hyperdense vessel or unexpected calcification. Skull: Normal. Negative for fracture or focal lesion. Other: Minimal right forehead contusion. CT MAXILLOFACIAL FINDINGS Osseous: No acute fracture. No mandibular subluxation. Orbits: The globes and retro-orbital fat are preserved. Sinuses: Diffuse mucoperiosteal thickening of the right maxillary sinus and ethmoid air cells. No air-fluid level. The mastoid air cells are clear. Soft tissues: Right periorbital hematoma. CT CERVICAL SPINE FINDINGS Alignment: No acute subluxation. Skull base and vertebrae: No acute fracture. No primary bone lesion or focal pathologic process. Soft tissues and spinal canal: No prevertebral fluid or swelling. No visible canal hematoma. Disc levels: Degenerative changes most prominent at C5-C6 and C6-C7. Upper chest: Negative. Other: None IMPRESSION: 1. No acute intracranial hemorrhage. 2. No acute facial bone fractures. 3. Right periorbital hematoma. 4. No acute/traumatic cervical spine pathology. Electronically Signed   By: Anner Crete M.D.   On: 03/19/2018 01:44   Ct Maxillofacial Wo Contrast  Result  Date: 03/19/2018 CLINICAL DATA:  83 year old male with facial trauma. EXAM: CT HEAD WITHOUT CONTRAST CT MAXILLOFACIAL WITHOUT CONTRAST CT CERVICAL SPINE WITHOUT CONTRAST TECHNIQUE: Multidetector CT imaging of the head, cervical spine, and maxillofacial structures were performed using the standard protocol without intravenous contrast. Multiplanar CT image reconstructions of the cervical spine and maxillofacial structures were also generated. COMPARISON:  CT of the head dated 04/21/2017 FINDINGS: CT HEAD FINDINGS Brain: There is mild age-related atrophy and chronic microvascular ischemic changes. 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 There is no acute intracranial hemorrhage. No mass effect or midline shift. No extra-axial fluid collection. Vascular: No hyperdense vessel or unexpected calcification. Skull: Normal. Negative for fracture or focal lesion. Other: Minimal right forehead contusion. CT MAXILLOFACIAL FINDINGS Osseous: No acute fracture. No  mandibular subluxation. Orbits: The globes and retro-orbital fat are preserved. Sinuses: Diffuse mucoperiosteal thickening of the right maxillary sinus and ethmoid air cells. No air-fluid level. The mastoid air cells are clear. Soft tissues: Right periorbital hematoma. CT CERVICAL SPINE FINDINGS Alignment: No acute subluxation. Skull base and vertebrae: No acute fracture. No primary bone lesion or focal pathologic process. Soft tissues and spinal canal: No prevertebral fluid or swelling. No visible canal hematoma. Disc levels: Degenerative changes most prominent at C5-C6 and C6-C7. Upper chest: Negative. Other: None IMPRESSION: 1. No acute intracranial hemorrhage. 2. No acute facial bone fractures. 3. Right periorbital hematoma. 4. No acute/traumatic cervical spine pathology. Electronically Signed   By: Anner Crete M.D.   On: 03/19/2018 01:44    Assessment & Plan:   Corey [name] was seen today for hypertension and  diabetes.  Diagnoses and all orders for this visit:  Type 2 diabetes mellitus with complication, without long-term current use of insulin (Corey [name])- He is not tolerating the SGLT2 inhibitor so I have asked him to stop taking it.  I will also decrease his dose of metformin and change to an XR product for better tolerance. -     metFORMIN (GLUCOPHAGE-XR) 500 MG 24 hr tablet; Take 2 tablets (1,000 mg total) by mouth daily with breakfast. -     HM Diabetes Foot Exam  Atrial fibrillation, unspecified type (Corey [name])- He has good rate and rhythm control.  Essential hypertension- His blood pressure is adequately well controlled.   I have discontinued Corey [name]. I am also having him start on metFORMIN. Additionally, I am having him maintain his albuterol, levalbuterol, albuterol, mometasone, thiamine, sertraline, folic acid, metoprolol succinate, levothyroxine, amiodarone, spironolactone, warfarin, rosuvastatin, and pantoprazole.  Meds ordered this encounter  Medications   metFORMIN (GLUCOPHAGE-XR) 500 MG 24 hr tablet    Sig: Take 2 tablets (1,000 mg total) by mouth daily with breakfast.    Dispense:  180 tablet    Refill:  1     Follow-up: Return in about 6 months (around 11/13/2018).  Corey Calico, MD

## 2018-05-14 NOTE — Telephone Encounter (Signed)
Attempted 2nd ICM referral intro call and no answer.  No return calls from patient and unable to enroll in [facility] clinic.  Patient will continue to be followed by device clinic every 91 days per protocol.

## 2018-05-16 ENCOUNTER — Ambulatory Visit (INDEPENDENT_AMBULATORY_CARE_PROVIDER_SITE_OTHER): Payer: Medicare Other | Admitting: Pharmacist

## 2018-05-16 ENCOUNTER — Other Ambulatory Visit: Payer: Self-pay

## 2018-05-16 DIAGNOSIS — Z5181 Encounter for therapeutic drug level monitoring: Secondary | ICD-10-CM | POA: Diagnosis not present

## 2018-05-16 DIAGNOSIS — I4891 Unspecified atrial fibrillation: Secondary | ICD-10-CM

## 2018-05-16 LAB — POCT INR: INR: 2.1 (ref 2.0–3.0)

## 2018-05-16 MED ORDER — APIXABAN 5 MG PO TABS: 5.0000 mg | ORAL_TABLET | Freq: Two times a day (BID) | ORAL | 5 refills | Status: DC

## 2018-05-16 MED ORDER — APIXABAN 2.5 MG PO TABS: 2.5000 mg | ORAL_TABLET | Freq: Two times a day (BID) | ORAL | 5 refills | Status: DC

## 2018-05-18 ENCOUNTER — Other Ambulatory Visit: Payer: Self-pay | Admitting: Internal Medicine

## 2018-05-21 ENCOUNTER — Encounter: Payer: Medicare Other | Admitting: Psychology

## 2018-06-04 ENCOUNTER — Other Ambulatory Visit (INDEPENDENT_AMBULATORY_CARE_PROVIDER_SITE_OTHER): Payer: Medicare Other

## 2018-06-04 ENCOUNTER — Other Ambulatory Visit: Payer: Self-pay

## 2018-06-04 ENCOUNTER — Encounter: Payer: Self-pay | Admitting: Internal Medicine

## 2018-06-04 ENCOUNTER — Ambulatory Visit (INDEPENDENT_AMBULATORY_CARE_PROVIDER_SITE_OTHER): Payer: Medicare Other | Admitting: Internal Medicine

## 2018-06-04 VITALS — BP 120/70 | HR 70 | Temp 98.0°F | Resp 16 | Ht 72.0 in | Wt 181.0 lb

## 2018-06-04 DIAGNOSIS — E118 Type 2 diabetes mellitus with unspecified complications: Secondary | ICD-10-CM

## 2018-06-04 DIAGNOSIS — I2510 Atherosclerotic heart disease of native coronary artery without angina pectoris: Secondary | ICD-10-CM

## 2018-06-04 DIAGNOSIS — N183 Chronic kidney disease, stage 3 unspecified: Secondary | ICD-10-CM

## 2018-06-04 DIAGNOSIS — D510 Vitamin B12 deficiency anemia due to intrinsic factor deficiency: Secondary | ICD-10-CM

## 2018-06-04 DIAGNOSIS — Z5181 Encounter for therapeutic drug level monitoring: Secondary | ICD-10-CM

## 2018-06-04 DIAGNOSIS — I10 Essential (primary) hypertension: Secondary | ICD-10-CM

## 2018-06-04 DIAGNOSIS — K7010 Alcoholic hepatitis without ascites: Secondary | ICD-10-CM | POA: Diagnosis not present

## 2018-06-04 DIAGNOSIS — Z0000 Encounter for general adult medical examination without abnormal findings: Secondary | ICD-10-CM

## 2018-06-04 DIAGNOSIS — E032 Hypothyroidism due to medicaments and other exogenous substances: Secondary | ICD-10-CM | POA: Diagnosis not present

## 2018-06-04 DIAGNOSIS — D508 Other iron deficiency anemias: Secondary | ICD-10-CM

## 2018-06-04 LAB — APTT: aPTT: 34.1 s — ABNORMAL HIGH (ref 23.4–32.7)

## 2018-06-04 LAB — COMPREHENSIVE METABOLIC PANEL
ALT: 30 U/L (ref 0–53)
AST: 49 U/L — ABNORMAL HIGH (ref 0–37)
Albumin: 4.4 g/dL (ref 3.5–5.2)
Alkaline Phosphatase: 97 U/L (ref 39–117)
BUN: 18 mg/dL (ref 6–23)
CO2: 27 mEq/L (ref 19–32)
Calcium: 9.3 mg/dL (ref 8.4–10.5)
Chloride: 100 mEq/L (ref 96–112)
Creatinine, Ser: 1.43 mg/dL (ref 0.40–1.50)
GFR: 47.53 mL/min — ABNORMAL LOW (ref >60.00)
Glucose, Bld: 97 mg/dL (ref 70–99)
Potassium: 4.6 mEq/L (ref 3.5–5.1)
Sodium: 138 mEq/L (ref 135–145)
Total Bilirubin: 0.8 mg/dL (ref 0.2–1.2)
Total Protein: 7.6 g/dL (ref 6.0–8.3)

## 2018-06-04 LAB — CBC WITH DIFFERENTIAL/PLATELET
Basophils Absolute: 0 10*3/uL (ref 0.0–0.1)
Basophils Relative: 0.4 % (ref 0.0–3.0)
Eosinophils Absolute: 0.2 10*3/uL (ref 0.0–0.7)
Eosinophils Relative: 2.8 % (ref 0.0–5.0)
HCT: 40 % (ref 39.0–52.0)
Hemoglobin: 13.6 g/dL (ref 13.0–17.0)
Lymphocytes Relative: 30.6 % (ref 12.0–46.0)
Lymphs Abs: 1.7 10*3/uL (ref 0.7–4.0)
MCHC: 34.1 g/dL (ref 30.0–36.0)
MCV: 97.3 fl (ref 78.0–100.0)
Monocytes Absolute: 0.4 10*3/uL (ref 0.1–1.0)
Monocytes Relative: 7.2 % (ref 3.0–12.0)
Neutro Abs: 3.3 10*3/uL (ref 1.4–7.7)
Neutrophils Relative %: 59 % (ref 43.0–77.0)
Platelets: 171 10*3/uL (ref 150.0–400.0)
RBC: 4.11 Mil/uL — ABNORMAL LOW (ref 4.22–5.81)
RDW: 14.4 % (ref 11.5–15.5)
WBC: 5.6 10*3/uL (ref 4.0–10.5)

## 2018-06-04 LAB — HEMOGLOBIN A1C: Hgb A1c MFr Bld: 5.5 % (ref 4.6–6.5)

## 2018-06-04 LAB — PROTIME-INR
INR: 1.4 ratio — ABNORMAL HIGH (ref 0.8–1.0)
Prothrombin Time: 16.3 s — ABNORMAL HIGH (ref 9.6–13.1)

## 2018-06-04 LAB — TSH: TSH: 1.67 u[IU]/mL (ref 0.35–4.50)

## 2018-06-04 NOTE — Progress Notes (Signed)
Subjective:  Patient ID: Corey [name], male    DOB: [birth year]  Age: 83 y.o. MRN: [id number]  CC: Abdominal Pain; Diabetes; and Annual Exam    HPI Corey [name] presents for a CPX.  He complains today that he feels like he has a hangover.  He has a mild headache, loss of appetite, and mild diffuse abdominal pain.  He fell 1 day prior to this and has an abrasion on his right knee.  He can bear weight on the right lower extremity and tells me the range of motion of the right knee is normal with no swelling.  His wife thinks he is taking his medications.  She sets his medications out in an organizer but she does not actually give them to him every day and he will not let her help him with his medications.  He just simply is not willing to quit drinking alcohol.  Past Medical History:  Diagnosis Date   Alcohol abuse, episodic 05/19/2008   ALLERGIC RHINITIS 04/15/2009   Allergy    YEAR ROUND,TAKE SHOTS   Alzheimer's dementia ([place])    beginning with sx   Anxiety    Arthritis    ASTHMA 11/16/2006   sinusitis-    hx ?yeast patch/white patch on vocal cord as per [facility] 02/25/11-    Atrial fibrillation ([place]) 11/16/2006   remote CHF related to atrial fib with rapid ventricular response over 25 yrs per office note,   Cancer ([place]) 2002   prostate cancer   Chronic systolic CHF (congestive heart failure) ([place])    echo (10/02/12): EF 17-51%, grade 1 diastolic dysfunction, trivial AI, mild MR, mild RVE   COLONIC POLYPS, ADENOMATOUS, HX OF 02/14/2008   CORONARY ARTERY DISEASE 11/16/2006   LHC (07/2011): LAD with luminal irregularities, proximal circumflex 50%, EF 25%   Depression    DIVERTICULOSIS, COLON 02/14/2008   ESOPHAGEAL STRICTURE 01/28/2008   GERD 02/14/2008   Headache(784.0)    HYPERGLYCEMIA 11/16/2006   HYPERLIPIDEMIA 02/14/2008   HYPERTENSION 11/16/2006   ICD (implantable cardiac defibrillator) in place    INSOMNIA-SLEEP [facility] 02/11/2009   Lesion of  vocal cord    bx begine   Myocardial infarction ([place]) 1993   NICM (nonischemic cardiomyopathy) ([place])    OBSTRUCTIVE SLEEP APNEA 11/10/2008   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 Jude   Pneumonia    hx of   PROSTATE CANCER, HX OF 02/25/2000   SLEEP APNEA 10/03/2009   LOV Dr [name] 12/12 in EPIC    Moderate per patient- settings ?6/last sleep study years ago   Sleep apnea    Stroke ([place])    [place]   Symptomatic bradycardia, secondary to sinus node dysfunction 07/08/2011   s/p [employer] PPM by Dr Sallyanne Kuster, upgraded to [place] ICD 12/13 by Dr Rayann Heman (SJM)   [place], HX OF 11/16/2006   Type 2 diabetes mellitus with complication, without long-term current use of insulin ([place]) 11/19/2016   Past Surgical History:  Procedure Laterality Date   BACK SURGERY     BI-VENTRICULAR IMPLANTABLE CARDIOVERTER DEFIBRILLATOR UPGRADE N/A 01/16/2012   Procedure: BI-VENTRICULAR IMPLANTABLE CARDIOVERTER DEFIBRILLATOR UPGRADE;  Surgeon: Thompson Grayer, MD;  Location: [facility] CATH LAB;  Service: Cardiovascular;  Laterality: N/A;   CARDIAC CATHETERIZATION     several   CARDIAC DEFIBRILLATOR PLACEMENT  01/16/12   Upgrade to a biventricular SJM ICD by DR Allred   COLONOSCOPY     ESOPHAGOGASTRODUODENOSCOPY     with dilitation  INSERT / REPLACE / REMOVE PACEMAKER     KNEE ARTHROSCOPY     2 surgeries right and 1 left   LEFT AND RIGHT HEART CATHETERIZATION WITH CORONARY ANGIOGRAM N/A 08/18/2011   Procedure: LEFT AND RIGHT HEART CATHETERIZATION WITH CORONARY ANGIOGRAM;  Surgeon: [name], MD;  Location: [facility] CATH LAB;  Service: Cardiovascular;  Laterality: N/A;   LUMBAR LAMINECTOMY/DECOMPRESSION MICRODISCECTOMY  03/02/2011   Procedure: LUMBAR LAMINECTOMY/DECOMPRESSION MICRODISCECTOMY;  Surgeon: [name], MD;  Location: [facility] ORS;  Service: Orthopedics;  Laterality: N/A;  Decompression Lumbar 4 - Lumbar(X-Ray)   PACEMAKER INSERTION  06/2011   [employer] PPM implanted by Dr Sallyanne Kuster   PERMANENT PACEMAKER INSERTION Left 07/08/2011   Procedure: PERMANENT PACEMAKER INSERTION;  Surgeon: [name], MD;  Location: [facility] CATH LAB;  Service: Cardiovascular;  Laterality: Left;   PROSTATE SURGERY  2/02   prostate cancer   TONSILECTOMY, ADENOIDECTOMY, BILATERAL MYRINGOTOMY AND TUBES     TONSILLECTOMY  as child   TOTAL KNEE ARTHROPLASTY Left 06/04/2012   Procedure: TOTAL KNEE ARTHROPLASTY;  Surgeon: [name], MD;  Location: [facility] ORS;  Service: Orthopedics;  Laterality: Left;   UVULOPALATOPHARYNGOPLASTY  2013   vocal cord lesion bx      reports that he quit smoking about 54 years ago. His smoking use included cigarettes. He has a 4.00 pack-year smoking history. He has never used smokeless tobacco. He reports current alcohol use of about 35.0 standard drinks of alcohol per week. He reports that he does not use drugs. family history includes Autism in his son; Heart attack in his brother, father, and maternal uncle; Heart disease in his maternal uncle; Sudden death (age of onset: 33) in his daughter. Allergies  Allergen Reactions   Altace [Ramipril] Cough   Hydrocodone Other (See Comments)    Severe panic attacks   Oxycodone Other (See Comments)    Severe panic attacks    Outpatient Medications Prior to Visit  Medication Sig Dispense Refill   albuterol (ACCUNEB) 0.63 MG/3ML nebulizer solution Inhale into the lungs.     albuterol (PROVENTIL HFA;VENTOLIN HFA) 108 (90 BASE) MCG/ACT inhaler Inhale 1-2 puffs into the lungs every 6 (six) hours as needed for shortness of breath. 1 Inhaler 11   amiodarone (PACERONE) 200 MG tablet TAKE ONE-HALF TABLET DAILY 45 tablet 3   apixaban (ELIQUIS) 2.5 MG TABS tablet Take 1 tablet (2.5 mg total) by mouth 2 (two) times daily. 60 tablet 5   folic acid (FOLVITE) 1 MG tablet TAKE ONE TABLET EACH DAY 90 tablet 1   levalbuterol (XOPENEX) 1.25 MG/3ML nebulizer solution Inhale into the lungs as  needed.      levothyroxine (SYNTHROID, LEVOTHROID) 75 MCG tablet Take 1 tablet (75 mcg total) by mouth daily. 90 tablet 0   metoprolol succinate (TOPROL-XL) 50 MG 24 hr tablet TAKE ONE-HALF TABLET DAILY 45 tablet 2   mometasone (NASONEX) 50 MCG/ACT nasal spray 1-2 puffs each nostril once or twice daily if needed (Patient taking differently: as needed. 1-2 puffs each nostril once or twice daily if needed) 17 g 0   pantoprazole (PROTONIX) 40 MG tablet Take 1 tablet (40 mg total) by mouth daily. 90 tablet 0   rosuvastatin (CRESTOR) 5 MG tablet TAKE ONE TABLET AT BEDTIME 90 tablet 1   sertraline (ZOLOFT) 100 MG tablet Take 1.5 tablets (150 mg total) by mouth daily. 135 tablet 1   spironolactone (ALDACTONE) 25 MG tablet TAKE ONE TABLET DAILY 30 tablet 1   thiamine (VITAMIN B-1) 100 MG  tablet TAKE ONE TABLET EACH DAY 90 tablet 1   metFORMIN (GLUCOPHAGE-XR) 500 MG 24 hr tablet Take 2 tablets (1,000 mg total) by mouth daily with breakfast. 180 tablet 1   No facility-administered medications prior to visit.     ROS Review of Systems  Constitutional: Positive for appetite change, fatigue and unexpected weight change (wt gain). Negative for activity change, chills and diaphoresis.  HENT: Negative.  Negative for trouble swallowing.   Eyes: Negative for visual disturbance.  Respiratory: Negative for cough, chest tightness, shortness of breath and wheezing.   Gastrointestinal: Positive for abdominal pain and anal bleeding. Negative for blood in stool, diarrhea, nausea and vomiting.       He has intermittent episodes of anal bleeding  Endocrine: Negative.   Genitourinary: Negative.  Negative for difficulty urinating, dysuria and hematuria.  Musculoskeletal: Negative.  Negative for arthralgias, back pain and neck pain.  Skin: Positive for wound. Negative for color change.  Neurological: Positive for headaches. Negative for dizziness, weakness and light-headedness.  Hematological: Negative for  adenopathy. Does not bruise/bleed easily.  Psychiatric/Behavioral: Positive for behavioral problems, confusion, decreased concentration and sleep disturbance. Negative for agitation and suicidal ideas. The patient is not nervous/anxious.     Objective:  BP 120/70 (BP Location: Left Arm, Patient Position: Sitting, Cuff Size: Normal)    Pulse 70    Temp 98 F (36.7 C) (Oral)    Resp 16    Ht 6' (1.829 m)    Wt 181 lb (82.1 kg)    SpO2 97%    BMI 24.55 kg/m   BP Readings from Last 3 Encounters:  06/04/18 120/70  05/14/18 (!) 112/58  03/21/18 (!) 142/70    Wt Readings from Last 3 Encounters:  06/04/18 181 lb (82.1 kg)  05/14/18 178 lb 4 oz (80.9 kg)  05/10/18 177 lb (80.3 kg)    Physical Exam Vitals signs reviewed.  Constitutional:      General: He is not in acute distress.    Appearance: He is not toxic-appearing or diaphoretic.  HENT:     Head: Normocephalic and atraumatic.     Mouth/Throat:     Mouth: Mucous membranes are moist.     Pharynx: No pharyngeal swelling or oropharyngeal exudate.  Eyes:     General: No scleral icterus. Cardiovascular:     Rate and Rhythm: Normal rate and regular rhythm.     Heart sounds: Normal heart sounds. No murmur. No gallop.   Pulmonary:     Effort: Pulmonary effort is normal. No respiratory distress.     Breath sounds: No stridor. No wheezing, rhonchi or rales.  Abdominal:     General: Abdomen is protuberant. Bowel sounds are normal. There is no distension.     Palpations: There is no hepatomegaly or splenomegaly.     Tenderness: There is no abdominal tenderness.     Hernia: No hernia is present. There is no hernia in the right inguinal area or left inguinal area.  Genitourinary:    Penis: Normal and circumcised.      Scrotum/Testes:        Right: Mass not present.        Left: Mass not present.     Epididymis:     Right: Normal.     Left: Normal.     Prostate: Normal. Not enlarged, not tender and no nodules present.     Rectum:  Guaiac result negative. External hemorrhoid present. No mass, tenderness, anal fissure or internal hemorrhoid. Normal anal  tone.  Musculoskeletal: Normal range of motion.        General: No swelling.     Right knee: He exhibits deformity. He exhibits normal range of motion, no swelling, no effusion, no ecchymosis, no erythema, normal alignment, no LCL laxity and no bony tenderness. No tenderness found.     Right lower leg: No edema.     Left lower leg: No edema.       Legs:  Lymphadenopathy:     Lower Body: No right inguinal adenopathy. No left inguinal adenopathy.  Skin:    General: Skin is warm and dry.  Neurological:     General: No focal deficit present.     Mental Status: He is alert.     Cranial Nerves: Cranial nerves are intact.     Sensory: Sensation is intact.     Motor: Motor function is intact.     Deep Tendon Reflexes: Reflexes are normal and symmetric.  Psychiatric:        Mood and Affect: Mood is anxious. Mood is not depressed. Affect is angry.        Speech: Speech normal.        Behavior: Behavior is slowed and withdrawn.        Thought Content: Thought content is delusional. Thought content is not paranoid. Thought content does not include homicidal or suicidal ideation.        Cognition and Memory: Cognition is impaired. Memory is impaired. He exhibits impaired recent memory and impaired remote memory.        Judgment: Judgment normal.     Lab Results  Component Value Date   WBC 5.6 06/04/2018   HGB 13.6 06/04/2018   HCT 40.0 06/04/2018   PLT 171.0 06/04/2018   GLUCOSE 97 06/04/2018   CHOL 162 11/22/2017   TRIG 143.0 11/22/2017   HDL 54.50 11/22/2017   LDLCALC 79 11/22/2017   ALT 30 06/04/2018   AST 49 (H) 06/04/2018   NA 138 06/04/2018   K 4.6 06/04/2018   CL 100 06/04/2018   CREATININE 1.43 06/04/2018   BUN 18 06/04/2018   CO2 27 06/04/2018   TSH 1.67 06/04/2018   PSA <0.015 05/26/2016   INR 1.4 (H) 06/04/2018   HGBA1C 5.5 06/04/2018    MICROALBUR 9.4 (H) 03/21/2018    Ct Head Wo Contrast  Result Date: 03/19/2018 CLINICAL DATA:  83 year old male with facial trauma. EXAM: CT HEAD WITHOUT CONTRAST CT MAXILLOFACIAL WITHOUT CONTRAST CT CERVICAL SPINE WITHOUT CONTRAST TECHNIQUE: Multidetector CT imaging of the head, cervical spine, and maxillofacial structures were performed using the standard protocol without intravenous contrast. Multiplanar CT image reconstructions of the cervical spine and maxillofacial structures were also generated. COMPARISON:  CT of the head dated 04/21/2017 FINDINGS: CT HEAD FINDINGS Brain: There is mild age-related atrophy and chronic microvascular ischemic changes. 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 There is no acute intracranial hemorrhage. No mass effect or midline shift. No extra-axial fluid collection. Vascular: No hyperdense vessel or unexpected calcification. Skull: Normal. Negative for fracture or focal lesion. Other: Minimal right forehead contusion. CT MAXILLOFACIAL FINDINGS Osseous: No acute fracture. No mandibular subluxation. Orbits: The globes and retro-orbital fat are preserved. Sinuses: Diffuse mucoperiosteal thickening of the right maxillary sinus and ethmoid air cells. No air-fluid level. The mastoid air cells are clear. Soft tissues: Right periorbital hematoma. CT CERVICAL SPINE FINDINGS Alignment: No acute subluxation. Skull base and vertebrae: No acute fracture. No primary bone lesion or focal pathologic process.  Soft tissues and spinal canal: No prevertebral fluid or swelling. No visible canal hematoma. Disc levels: Degenerative changes most prominent at C5-C6 and C6-C7. Upper chest: Negative. Other: None IMPRESSION: 1. No acute intracranial hemorrhage. 2. No acute facial bone fractures. 3. Right periorbital hematoma. 4. No acute/traumatic cervical spine pathology. Electronically Signed   By: Anner Crete M.D.   On: 03/19/2018 01:44   Ct Cervical Spine  Wo Contrast  Result Date: 03/19/2018 CLINICAL DATA:  83 year old male with facial trauma. EXAM: CT HEAD WITHOUT CONTRAST CT MAXILLOFACIAL WITHOUT CONTRAST CT CERVICAL SPINE WITHOUT CONTRAST TECHNIQUE: Multidetector CT imaging of the head, cervical spine, and maxillofacial structures were performed using the standard protocol without intravenous contrast. Multiplanar CT image reconstructions of the cervical spine and maxillofacial structures were also generated. COMPARISON:  CT of the head dated 04/21/2017 FINDINGS: CT HEAD FINDINGS Brain: There is mild age-related atrophy and chronic microvascular ischemic changes. 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 There is no acute intracranial hemorrhage. No mass effect or midline shift. No extra-axial fluid collection. Vascular: No hyperdense vessel or unexpected calcification. Skull: Normal. Negative for fracture or focal lesion. Other: Minimal right forehead contusion. CT MAXILLOFACIAL FINDINGS Osseous: No acute fracture. No mandibular subluxation. Orbits: The globes and retro-orbital fat are preserved. Sinuses: Diffuse mucoperiosteal thickening of the right maxillary sinus and ethmoid air cells. No air-fluid level. The mastoid air cells are clear. Soft tissues: Right periorbital hematoma. CT CERVICAL SPINE FINDINGS Alignment: No acute subluxation. Skull base and vertebrae: No acute fracture. No primary bone lesion or focal pathologic process. Soft tissues and spinal canal: No prevertebral fluid or swelling. No visible canal hematoma. Disc levels: Degenerative changes most prominent at C5-C6 and C6-C7. Upper chest: Negative. Other: None IMPRESSION: 1. No acute intracranial hemorrhage. 2. No acute facial bone fractures. 3. Right periorbital hematoma. 4. No acute/traumatic cervical spine pathology. Electronically Signed   By: Anner Crete M.D.   On: 03/19/2018 01:44   Ct Maxillofacial Wo Contrast  Result Date:  03/19/2018 CLINICAL DATA:  83 year old male with facial trauma. EXAM: CT HEAD WITHOUT CONTRAST CT MAXILLOFACIAL WITHOUT CONTRAST CT CERVICAL SPINE WITHOUT CONTRAST TECHNIQUE: Multidetector CT imaging of the head, cervical spine, and maxillofacial structures were performed using the standard protocol without intravenous contrast. Multiplanar CT image reconstructions of the cervical spine and maxillofacial structures were also generated. COMPARISON:  CT of the head dated 04/21/2017 FINDINGS: CT HEAD FINDINGS Brain: There is mild age-related atrophy and chronic microvascular ischemic changes. 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 There is no acute intracranial hemorrhage. No mass effect or midline shift. No extra-axial fluid collection. Vascular: No hyperdense vessel or unexpected calcification. Skull: Normal. Negative for fracture or focal lesion. Other: Minimal right forehead contusion. CT MAXILLOFACIAL FINDINGS Osseous: No acute fracture. No mandibular subluxation. Orbits: The globes and retro-orbital fat are preserved. Sinuses: Diffuse mucoperiosteal thickening of the right maxillary sinus and ethmoid air cells. No air-fluid level. The mastoid air cells are clear. Soft tissues: Right periorbital hematoma. CT CERVICAL SPINE FINDINGS Alignment: No acute subluxation. Skull base and vertebrae: No acute fracture. No primary bone lesion or focal pathologic process. Soft tissues and spinal canal: No prevertebral fluid or swelling. No visible canal hematoma. Disc levels: Degenerative changes most prominent at C5-C6 and C6-C7. Upper chest: Negative. Other: None IMPRESSION: 1. No acute intracranial hemorrhage. 2. No acute facial bone fractures. 3. Right periorbital hematoma. 4. No acute/traumatic cervical spine pathology. Electronically Signed   By: Milas Hock  Radparvar M.D.   On: 03/19/2018 01:44    Assessment & Plan:    I have discontinued [place] metFORMIN. I am also  having him maintain his albuterol, levalbuterol, albuterol, mometasone, thiamine, sertraline, folic acid, levothyroxine, amiodarone, spironolactone, rosuvastatin, pantoprazole, apixaban, and metoprolol succinate.  No orders of the defined types were placed in this encounter.  See AVS for instructions about healthy living and anticipatory guidance.  Follow-up: Return in about 6 months (around 12/05/2018).  Scarlette Calico, MD

## 2018-06-04 NOTE — Patient Instructions (Signed)
Hypothyroidism  Hypothyroidism is when the thyroid gland does not make enough of certain hormones (it is underactive). The thyroid gland is a small gland located in the lower front part of the neck, just in front of the windpipe (trachea). This gland makes hormones that help control how the body uses food for energy (metabolism) as well as how the heart and brain function. These hormones also play a role in keeping your bones strong. When the thyroid is underactive, it produces too little of the hormones thyroxine (T4) and triiodothyronine (T3). What are the causes? This condition may be caused by:  Hashimoto's disease. This is a disease in which the body's disease-fighting system (immune system) attacks the thyroid gland. This is the most common cause.  Viral infections.  Pregnancy.  Certain medicines.  Birth defects.  Past radiation treatments to the head or neck for cancer.  Past treatment with radioactive iodine.  Past exposure to radiation in the environment.  Past surgical removal of part or all of the thyroid.  Problems with a gland in the center of the brain (pituitary gland).  Lack of enough iodine in the diet. What increases the risk? You are more likely to develop this condition if:  You are male.  You have a family history of thyroid conditions.  You use a medicine called lithium.  You take medicines that affect the immune system (immunosuppressants). What are the signs or symptoms? Symptoms of this condition include:  Feeling as though you have no energy (lethargy).  Not being able to tolerate cold.  Weight gain that is not explained by a change in diet or exercise habits.  Lack of appetite.  Dry skin.  Coarse hair.  Menstrual irregularity.  Slowing of thought processes.  Constipation.  Sadness or depression. How is this diagnosed? This condition may be diagnosed based on:  Your symptoms, your medical history, and a physical exam.  Blood  tests. You may also have imaging tests, such as an ultrasound or MRI. How is this treated? This condition is treated with medicine that replaces the thyroid hormones that your body does not make. After you begin treatment, it may take several weeks for symptoms to go away. Follow these instructions at home:  Take over-the-counter and prescription medicines only as told by your health care provider.  If you start taking any new medicines, tell your health care provider.  Keep all follow-up visits as told by your health care provider. This is important. ? As your condition improves, your dosage of thyroid hormone medicine may change. ? You will need to have blood tests regularly so that your health care provider can monitor your condition. Contact a health care provider if:  Your symptoms do not get better with treatment.  You are taking thyroid replacement medicine and you: ? Sweat a lot. ? Have tremors. ? Feel anxious. ? Lose weight rapidly. ? Cannot tolerate heat. ? Have emotional swings. ? Have diarrhea. ? Feel weak. Get help right away if you have:  Chest pain.  An irregular heartbeat.  A rapid heartbeat.  Difficulty breathing. Summary  Hypothyroidism is when the thyroid gland does not make enough of certain hormones (it is underactive).  When the thyroid is underactive, it produces too little of the hormones thyroxine (T4) and triiodothyronine (T3).  The most common cause is Hashimoto's disease, a disease in which the body's disease-fighting system (immune system) attacks the thyroid gland. The condition can also be caused by viral infections, medicine, pregnancy, or past   radiation treatment to the head or neck.  Symptoms may include weight gain, dry skin, constipation, feeling as though you do not have energy, and not being able to tolerate cold.  This condition is treated with medicine to replace the thyroid hormones that your body does not make. This information  is not intended to replace advice given to you by your health care provider. Make sure you discuss any questions you have with your health care provider. Document Released: 01/10/2005 Document Revised: 12/21/2016 Document Reviewed: 12/21/2016 Elsevier Interactive Patient Education  2019 Elsevier Inc.  

## 2018-06-07 DIAGNOSIS — Z0000 Encounter for general adult medical examination without abnormal findings: Secondary | ICD-10-CM | POA: Insufficient documentation

## 2018-06-07 NOTE — Assessment & Plan Note (Signed)
His liver enzymes remain mildly elevated and he continues to drink alcohol. His wife is given up on him. He is not motivated to stop drinking. I have asked him to let me know when he is willing to go into an inpatient treatment center to go thru detox and intensive treatment for alcoholism.

## 2018-06-07 NOTE — Assessment & Plan Note (Signed)
His B12 level is in the low normal range.  Will continue B12 supplementation.

## 2018-06-07 NOTE — Assessment & Plan Note (Signed)
His renal function is stable.  Will avoid nephrotoxic agents.

## 2018-06-07 NOTE — Assessment & Plan Note (Signed)
His blood pressure is adequately well controlled. 

## 2018-06-07 NOTE — Assessment & Plan Note (Signed)
His A1c is down to 5.5% so have asked him to stop taking metformin.

## 2018-06-07 NOTE — Assessment & Plan Note (Signed)

## 2018-06-13 ENCOUNTER — Encounter: Payer: Medicare Other | Admitting: *Deleted

## 2018-06-13 ENCOUNTER — Other Ambulatory Visit: Payer: Self-pay

## 2018-06-14 ENCOUNTER — Other Ambulatory Visit: Payer: Self-pay | Admitting: Nurse Practitioner

## 2018-06-14 ENCOUNTER — Telehealth: Payer: Self-pay

## 2018-06-14 NOTE — Telephone Encounter (Signed)
Left message for patient to remind of missed remote transmission.  

## 2018-06-22 ENCOUNTER — Encounter: Payer: Self-pay | Admitting: Cardiology

## 2018-06-22 NOTE — Progress Notes (Deleted)
Subjective:   Corey [name] is a 83 y.o. male who presents for Medicare Annual/Subsequent preventive examination. I connected with patient by a telephone and verified that I am speaking with the correct person using two identifiers. Patient stated full name and DOB. Patient gave permission to continue with telephonic visit. Patient's location was at home and Nurse's location was at [place] office.  Review of Systems:  No ROS.  Medicare Wellness Virtual Visit.  Visual/audio telehealth visit, UTA vital signs.   See social history for additional risk factors.    Sleep patterns: {SX; SLEEP PATTERNS:18802::"feels rested on waking","does not get up to void","gets up *** times nightly to void","sleeps *** hours nightly"}.    Home Safety/Smoke Alarms: Feels safe in home. Smoke alarms in place.  Living environment; residence and Firearm Safety: {Rehab home environment / accessibility:30080::"no firearms","firearms stored safely"}. Seat Belt Safety/Bike Helmet: Wears seat belt.   PSA-  Lab Results  Component Value Date   PSA <0.015 05/26/2016   PSA 0.02 (L) 11/13/2013   PSA 0.03 (L) 12/13/2006       Objective:    Vitals: There were no vitals taken for this visit.  There is no height or weight on file to calculate BMI.  Advanced Directives 03/19/2018 03/19/2018 06/21/2017 03/27/2017 03/23/2017 12/09/2016 06/03/2016  Does Patient Have a Medical Advance Directive? Yes No Yes Yes Yes No Yes  Type of Advance Directive Living will - [place];Living will [place];Living will [place];Living will - Living will  Does patient want to make changes to medical advance directive? - - - No - Patient declined - - -  Copy of [place] in Chart? - - No - copy requested - No - copy requested - -  Would patient like information on creating a medical advance directive? - No - Patient declined - - - No - Patient declined -  Pre-existing  out of facility DNR order (yellow form or pink MOST form) - - - - - - -    Tobacco Social History   Tobacco Use  Smoking Status Former Smoker  . Packs/day: 1.00  . Years: 4.00  . Pack years: 4.00  . Types: Cigarettes  . Last attempt to quit: 03/22/1964  . Years since quitting: 54.2  Smokeless Tobacco Never Used  Tobacco Comment   reports smoked socially     Counseling given: Not Answered Comment: reports smoked socially  Past Medical History:  Diagnosis Date  . Alcohol abuse, episodic 05/19/2008  . ALLERGIC RHINITIS 04/15/2009  . Allergy    YEAR ROUND,TAKE SHOTS  . Alzheimer's dementia ([place])    beginning with sx  . Anxiety   . Arthritis   . ASTHMA 11/16/2006   sinusitis-    hx ?yeast patch/white patch on vocal cord as per [facility] 02/25/11-   . Atrial fibrillation ([place]) 11/16/2006   remote CHF related to atrial fib with rapid ventricular response over 25 yrs per office note,  . [facility]) 2002   prostate cancer  . Chronic systolic CHF (congestive heart failure) (HCC)    echo (10/02/12): EF 40-10%, grade 1 diastolic dysfunction, trivial AI, mild MR, mild RVE  . COLONIC POLYPS, ADENOMATOUS, HX OF 02/14/2008  . CORONARY ARTERY DISEASE 11/16/2006   LHC (07/2011): LAD with luminal irregularities, proximal circumflex 50%, EF 25%  . Depression   . DIVERTICULOSIS, COLON 02/14/2008  . ESOPHAGEAL STRICTURE 01/28/2008  . GERD 02/14/2008  . Headache(784.0)   . HYPERGLYCEMIA  11/16/2006  . HYPERLIPIDEMIA 02/14/2008  . HYPERTENSION 11/16/2006  . ICD (implantable cardiac defibrillator) in place   . INSOMNIA-SLEEP DISORDER-UNSPEC 02/11/2009  . Lesion of vocal cord    bx begine  . Myocardial infarction ([place]) 1993  . NICM (nonischemic cardiomyopathy) ([place])   . OBSTRUCTIVE SLEEP APNEA 11/10/2008  . 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 Pacemaker    St. Jude  . Pneumonia    hx of  . PROSTATE CANCER, HX OF 02/25/2000  . SLEEP APNEA 10/03/2009   LOV Dr [name] 12/12 in EPIC    Moderate per  patient- settings ?6/last sleep study years ago  . Sleep apnea   . Stroke ([place])    Tia  . Symptomatic bradycardia, secondary to sinus node dysfunction 07/08/2011   s/p [facility] PPM by Dr Sallyanne Kuster, upgraded to [place] ICD 12/13 by Dr Rayann Heman (SJM)  . TRANSIENT ISCHEMIC ATTACKS, HX OF 11/16/2006  . Type 2 diabetes mellitus with complication, without long-term current use of insulin ([place]) 11/19/2016   Past Surgical History:  Procedure Laterality Date  . BACK SURGERY    . BI-VENTRICULAR IMPLANTABLE CARDIOVERTER DEFIBRILLATOR UPGRADE N/A 01/16/2012   Procedure: BI-VENTRICULAR IMPLANTABLE CARDIOVERTER DEFIBRILLATOR UPGRADE;  Surgeon: Thompson Grayer, MD;  Location: [facility] CATH LAB;  Service: Cardiovascular;  Laterality: N/A;  . CARDIAC CATHETERIZATION     several  . CARDIAC DEFIBRILLATOR PLACEMENT  01/16/12   Upgrade to a biventricular SJM ICD by DR Allred  . COLONOSCOPY    . ESOPHAGOGASTRODUODENOSCOPY     with dilitation  . INSERT / REPLACE / REMOVE PACEMAKER    . KNEE ARTHROSCOPY     2 surgeries right and 1 left  . LEFT AND RIGHT HEART CATHETERIZATION WITH CORONARY ANGIOGRAM N/A 08/18/2011   Procedure: LEFT AND RIGHT HEART CATHETERIZATION WITH CORONARY ANGIOGRAM;  Surgeon: [name], MD;  Location: [facility] [place] CATH LAB;  Service: Cardiovascular;  Laterality: N/A;  . LUMBAR LAMINECTOMY/DECOMPRESSION MICRODISCECTOMY  03/02/2011   Procedure: LUMBAR LAMINECTOMY/DECOMPRESSION MICRODISCECTOMY;  Surgeon: [name], MD;  Location: [facility] ORS;  Service: Orthopedics;  Laterality: N/A;  Decompression Lumbar 4 - Lumbar(X-Ray)  . PACEMAKER INSERTION  06/2011   [employer] PPM implanted by Dr Sallyanne Kuster  . PERMANENT PACEMAKER INSERTION Left 07/08/2011   Procedure: PERMANENT PACEMAKER INSERTION;  Surgeon: [name], MD;  Location: [facility] CATH LAB;  Service: Cardiovascular;  Laterality: Left;  . PROSTATE SURGERY  2/02   prostate cancer  . TONSILECTOMY, ADENOIDECTOMY, BILATERAL MYRINGOTOMY AND TUBES    .  TONSILLECTOMY  as child  . TOTAL KNEE ARTHROPLASTY Left 06/04/2012   Procedure: TOTAL KNEE ARTHROPLASTY;  Surgeon: [name], MD;  Location: [facility] ORS;  Service: Orthopedics;  Laterality: Left;  . UVULOPALATOPHARYNGOPLASTY  2013  . vocal cord lesion bx     Family History  Problem Relation Age of Onset  . Heart attack Father   . Heart attack Brother   . Heart disease Maternal Uncle   . Heart attack Maternal Uncle   . Sudden death Daughter 79       unknown  . Autism Son   . Colon cancer Neg Hx   . Esophageal cancer Neg Hx   . Rectal cancer Neg Hx   . Prostate cancer Neg Hx   . Stomach cancer Neg Hx    Social History   Socioeconomic History  . Marital status: Married    Spouse name: [name]  . Number of children: 4  . Years of education: Not on file  . Highest education level:  Bachelor's degree (e.g., BA, AB, BS)  Occupational History  . Occupation: Retired  Scientific laboratory technician  . Financial resource strain: Not hard at all  . Food insecurity:    Worry: Never true    Inability: Never true  . Transportation needs:    Medical: No    Non-medical: No  Tobacco Use  . Smoking status: Former Smoker    Packs/day: 1.00    Years: 4.00    Pack years: 4.00    Types: Cigarettes    Last attempt to quit: 03/22/1964    Years since quitting: 54.2  . Smokeless tobacco: Never Used  . Tobacco comment: reports smoked socially  Substance and Sexual Activity  . Alcohol use: Yes    Alcohol/week: 35.0 standard drinks    Types: 35 Shots of liquor per week    Comment: 2 mixed drinks daily, some days  . Drug use: No  . Sexual activity: Yes    Partners: Female  Lifestyle  . Physical activity:    Days per week: 4 days    Minutes per session: 40 min  . Stress: Only a little  Relationships  . Social connections:    Talks on phone: More than three times a week    Gets together: More than three times a week    Attends religious service: More than 4 times per year    Active member of club or  organization: Not on file    Attends meetings of clubs or organizations: More than 4 times per year    Relationship status: Married  Other Topics Concern  . Not on file  Social History Narrative   Pt is right handed. He drinks coffee 3 x week, and tea 4 x week. He walks occasionally.    Outpatient Encounter Medications as of 06/25/2018  Medication Sig  . albuterol (ACCUNEB) 0.63 MG/3ML nebulizer solution Inhale into the lungs.  Marland Kitchen albuterol (PROVENTIL HFA;VENTOLIN HFA) 108 (90 BASE) MCG/ACT inhaler Inhale 1-2 puffs into the lungs every 6 (six) hours as needed for shortness of breath.  Marland Kitchen amiodarone (PACERONE) 200 MG tablet TAKE ONE-HALF TABLET DAILY  . apixaban (ELIQUIS) 2.5 MG TABS tablet Take 1 tablet (2.5 mg total) by mouth 2 (two) times daily.  . folic acid (FOLVITE) 1 MG tablet TAKE ONE TABLET EACH DAY  . levalbuterol (XOPENEX) 1.25 MG/3ML nebulizer solution Inhale into the lungs as needed.   Marland Kitchen levothyroxine (SYNTHROID, LEVOTHROID) 75 MCG tablet Take 1 tablet (75 mcg total) by mouth daily.  . metoprolol succinate (TOPROL-XL) 50 MG 24 hr tablet TAKE ONE-HALF TABLET DAILY  . mometasone (NASONEX) 50 MCG/ACT nasal spray 1-2 puffs each nostril once or twice daily if needed (Patient taking differently: as needed. 1-2 puffs each nostril once or twice daily if needed)  . pantoprazole (PROTONIX) 40 MG tablet Take 1 tablet (40 mg total) by mouth daily.  . rosuvastatin (CRESTOR) 5 MG tablet TAKE ONE TABLET AT BEDTIME  . sertraline (ZOLOFT) 100 MG tablet Take 1.5 tablets (150 mg total) by mouth daily.  Marland Kitchen spironolactone (ALDACTONE) 25 MG tablet TAKE ONE TABLET DAILY  . thiamine (VITAMIN B-1) 100 MG tablet TAKE ONE TABLET EACH DAY   No facility-administered encounter medications on file as of 06/25/2018.     Activities of Daily Living No flowsheet data found.  Patient Care Team: Janith Lima, MD as PCP - General (Internal Medicine)   Assessment:   This is a routine wellness examination for  [place]. Physical assessment deferred to PCP.  Exercise Activities and Dietary recommendations   Diet (meal preparation, eat out, water intake, caffeinated beverages, dairy products, fruits and vegetables): {Desc; diets:16563}  Goals    . Patient Stated     Maintain current health status and healthy and as independent as possible. Enjoy life, family and travel       Fall Risk Fall Risk  03/23/2018 11/27/2017 08/25/2017 06/21/2017 05/25/2017  Falls in the past year? 1 0 Yes Yes Yes  Number falls in past yr: 1 - 2 or more 2 or more 2 or more  Injury with Fall? 1 - Yes Yes Yes  Risk Factor Category  - - High Fall Risk - High Fall Risk  Risk for fall due to : History of fall(s);Impaired balance/gait;Impaired mobility;Mental status change - - Impaired balance/gait -  Follow up Falls evaluation completed;Education provided;Falls prevention discussed;Follow up appointment - - Falls prevention discussed;Education provided -    Depression Screen PHQ 2/9 Scores 03/23/2018 11/22/2017 06/21/2017 04/27/2017  PHQ - 2 Score 2 2 2 2   PHQ- 9 Score 7 7 7  -    Cognitive Function MMSE - Mini Mental State Exam 06/21/2017 08/26/2015 10/06/2014  Not completed: (No Data) - -  Orientation to time - 5 5  Orientation to Place - 5 5  Registration - 3 3  Attention/ Calculation - 5 5  Recall - 2 3  Language- name 2 objects - 2 2  Language- repeat - 1 1  Language- follow 3 step command - 3 3  Language- read & follow direction - 1 1  Write a sentence - 1 1  Copy design - 1 1  Total score - 29 30   Montreal Cognitive Assessment  05/25/2017 04/02/2014 10/09/2013  Visuospatial/ Executive (0/5) 2 5 5   Naming (0/3) 3 3 3   Attention: Read list of digits (0/2) 2 2 2   Attention: Read list of letters (0/1) 1 1 1   Attention: Serial 7 subtraction starting at 100 (0/3) 3 3 3   Language: Repeat phrase (0/2) 2 2 2   Language : Fluency (0/1) 1 1 1   Abstraction (0/2) 2 2 2   Delayed Recall (0/5) 0 2 2  Orientation (0/6) 6 6 6    Total 22 27 27   Adjusted Score (based on education) - 27 -      Immunization History  Administered Date(s) Administered  . Hep A / Hep B 08/17/2016  . Hepatitis B, adult 11/21/2016  . Influenza Split 12/22/2010, 12/27/2011, 11/24/2012  . Influenza Whole 11/16/2007, 11/10/2008  . Influenza, High Dose Seasonal PF 11/19/2015, 11/16/2016, 11/22/2017  . Influenza,inj,Quad PF,6+ Mos 11/18/2013, 12/17/2014  . Influenza-Unspecified 12/03/2015  . Pneumococcal Conjugate-13 01/01/2015  . Pneumococcal Polysaccharide-23 01/25/2004, 11/13/2013  . Td 12/20/2006  . Tdap 10/08/2014, 03/19/2018   Screening Tests Health Maintenance  Topic Date Due  . OPHTHALMOLOGY EXAM  06/02/2018  . INFLUENZA VACCINE  08/25/2018  . HEMOGLOBIN A1C  12/05/2018  . URINE MICROALBUMIN  03/22/2019  . FOOT EXAM  05/14/2019  . TETANUS/TDAP  03/19/2028  . PNA vac Low Risk Adult  Completed      Plan:     I have personally reviewed and noted the following in the patient's chart:   . Medical and social history . Use of alcohol, tobacco or illicit drugs  . Current medications and supplements . Functional ability and status . Nutritional status . Physical activity . Advanced directives . List of other physicians . Screenings to include cognitive, depression, and falls . Referrals and appointments  In addition,  I have reviewed and discussed with patient certain preventive protocols, quality metrics, and best practice recommendations. A written personalized care plan for preventive services as well as general preventive health recommendations were provided to patient.     Michiel Cowboy, RN  06/22/2018

## 2018-06-25 ENCOUNTER — Ambulatory Visit: Payer: Medicare Other

## 2018-07-06 DIAGNOSIS — Z950 Presence of cardiac pacemaker: Secondary | ICD-10-CM | POA: Diagnosis not present

## 2018-07-06 DIAGNOSIS — R9431 Abnormal electrocardiogram [ECG] [EKG]: Secondary | ICD-10-CM | POA: Diagnosis not present

## 2018-07-12 DIAGNOSIS — M545 Low back pain: Secondary | ICD-10-CM | POA: Diagnosis not present

## 2018-07-12 DIAGNOSIS — Z9181 History of falling: Secondary | ICD-10-CM | POA: Diagnosis not present

## 2018-07-13 DIAGNOSIS — Z9181 History of falling: Secondary | ICD-10-CM | POA: Diagnosis not present

## 2018-07-13 DIAGNOSIS — S61412A Laceration without foreign body of left hand, initial encounter: Secondary | ICD-10-CM | POA: Diagnosis not present

## 2018-07-13 DIAGNOSIS — I4891 Unspecified atrial fibrillation: Secondary | ICD-10-CM | POA: Diagnosis not present

## 2018-07-13 DIAGNOSIS — M79642 Pain in left hand: Secondary | ICD-10-CM | POA: Diagnosis not present

## 2018-07-17 DIAGNOSIS — I69314 Frontal lobe and executive function deficit following cerebral infarction: Secondary | ICD-10-CM | POA: Diagnosis not present

## 2018-07-18 NOTE — Addendum Note (Signed)
Addended by: Yetta Flock on: 07/18/2018 02:34 PM   Modules accepted: Level of Service

## 2018-08-20 ENCOUNTER — Telehealth: Payer: Self-pay | Admitting: *Deleted

## 2018-08-20 NOTE — Telephone Encounter (Signed)
Patient's wife (DPR) called in regarding letter he received dated 06/22/18. Pt is currently at [facility] in [place] through at least the beginning of September. His Merlin monitor is not with him. Advised wife that as his ICD is under an advisory, very important to keep monitoring up to date. She is currently in [place] and unable to bring him the monitor.   Discussed options--pt's wife is agreeable to me contacting program manager at treatment center. Will order new monitor and cell adapter to be sent to facility. Advised I will call facility tomorrow to confirm plan. Pt's wife verbalizes understanding and agreement with plan. She requests that we call her cell number if we need to reach her as she will not be back in [place] until later this summer.   Contact person: Tia Alert [phone], ext. [phone]. Mailing address: Janie Morning, Attn: [facility], [facility], [name], [address], [place] [address].

## 2018-08-21 NOTE — Telephone Encounter (Signed)
LM on confidential VM for Beverlee Nims requesting call back to DC. Direct number given.

## 2018-08-22 NOTE — Telephone Encounter (Signed)
New Message    Patient's therapist returning your call please call back.

## 2018-08-22 NOTE — Telephone Encounter (Signed)
LMOVM requesting call back to direct DC number.

## 2018-08-24 NOTE — Telephone Encounter (Signed)
LMOVM requesting call back to DC.  

## 2018-08-28 NOTE — Telephone Encounter (Signed)
Able to reach [place]. Pt is only going to be at the facility until 09/04/18, then will be heading to [place] with family, then coming back to [place]. Beverlee Nims concerned that monitor will not arrive at the facility in time. Advised I will call back if pt's wife still wants monitor to be sent there.  Spoke with pt's wife. She reports that pt will be back in [place] as of 09/09/18. She is in agreement that best option is to send a manual transmission when he gets home to re-establish remote monitoring. She agrees to call when he is home for assistance with the monitor. No further questions at this time. Pt's wife thanked me for call.

## 2018-09-05 ENCOUNTER — Ambulatory Visit: Payer: Medicare Other | Admitting: Neurology

## 2018-09-09 ENCOUNTER — Encounter: Payer: Self-pay | Admitting: Internal Medicine

## 2018-09-10 ENCOUNTER — Ambulatory Visit (INDEPENDENT_AMBULATORY_CARE_PROVIDER_SITE_OTHER): Payer: Medicare Other | Admitting: Internal Medicine

## 2018-09-10 ENCOUNTER — Encounter: Payer: Self-pay | Admitting: Internal Medicine

## 2018-09-10 ENCOUNTER — Other Ambulatory Visit: Payer: Self-pay

## 2018-09-10 VITALS — BP 110/68 | HR 81 | Temp 97.8°F | Resp 15 | Ht 72.0 in | Wt 188.0 lb

## 2018-09-10 DIAGNOSIS — I10 Essential (primary) hypertension: Secondary | ICD-10-CM

## 2018-09-10 DIAGNOSIS — I2510 Atherosclerotic heart disease of native coronary artery without angina pectoris: Secondary | ICD-10-CM

## 2018-09-10 DIAGNOSIS — K7010 Alcoholic hepatitis without ascites: Secondary | ICD-10-CM | POA: Diagnosis not present

## 2018-09-10 DIAGNOSIS — I480 Paroxysmal atrial fibrillation: Secondary | ICD-10-CM

## 2018-09-10 NOTE — Patient Instructions (Signed)
Hypothyroidism  Hypothyroidism is when the thyroid gland does not make enough of certain hormones (it is underactive). The thyroid gland is a small gland located in the lower front part of the neck, just in front of the windpipe (trachea). This gland makes hormones that help control how the body uses food for energy (metabolism) as well as how the heart and brain function. These hormones also play a role in keeping your bones strong. When the thyroid is underactive, it produces too little of the hormones thyroxine (T4) and triiodothyronine (T3). What are the causes? This condition may be caused by:  Hashimoto's disease. This is a disease in which the body's disease-fighting system (immune system) attacks the thyroid gland. This is the most common cause.  Viral infections.  Pregnancy.  Certain medicines.  Birth defects.  Past radiation treatments to the head or neck for cancer.  Past treatment with radioactive iodine.  Past exposure to radiation in the environment.  Past surgical removal of part or all of the thyroid.  Problems with a gland in the center of the brain (pituitary gland).  Lack of enough iodine in the diet. What increases the risk? You are more likely to develop this condition if:  You are male.  You have a family history of thyroid conditions.  You use a medicine called lithium.  You take medicines that affect the immune system (immunosuppressants). What are the signs or symptoms? Symptoms of this condition include:  Feeling as though you have no energy (lethargy).  Not being able to tolerate cold.  Weight gain that is not explained by a change in diet or exercise habits.  Lack of appetite.  Dry skin.  Coarse hair.  Menstrual irregularity.  Slowing of thought processes.  Constipation.  Sadness or depression. How is this diagnosed? This condition may be diagnosed based on:  Your symptoms, your medical history, and a physical exam.  Blood  tests. You may also have imaging tests, such as an ultrasound or MRI. How is this treated? This condition is treated with medicine that replaces the thyroid hormones that your body does not make. After you begin treatment, it may take several weeks for symptoms to go away. Follow these instructions at home:  Take over-the-counter and prescription medicines only as told by your health care provider.  If you start taking any new medicines, tell your health care provider.  Keep all follow-up visits as told by your health care provider. This is important. ? As your condition improves, your dosage of thyroid hormone medicine may change. ? You will need to have blood tests regularly so that your health care provider can monitor your condition. Contact a health care provider if:  Your symptoms do not get better with treatment.  You are taking thyroid replacement medicine and you: ? Sweat a lot. ? Have tremors. ? Feel anxious. ? Lose weight rapidly. ? Cannot tolerate heat. ? Have emotional swings. ? Have diarrhea. ? Feel weak. Get help right away if you have:  Chest pain.  An irregular heartbeat.  A rapid heartbeat.  Difficulty breathing. Summary  Hypothyroidism is when the thyroid gland does not make enough of certain hormones (it is underactive).  When the thyroid is underactive, it produces too little of the hormones thyroxine (T4) and triiodothyronine (T3).  The most common cause is Hashimoto's disease, a disease in which the body's disease-fighting system (immune system) attacks the thyroid gland. The condition can also be caused by viral infections, medicine, pregnancy, or past   radiation treatment to the head or neck.  Symptoms may include weight gain, dry skin, constipation, feeling as though you do not have energy, and not being able to tolerate cold.  This condition is treated with medicine to replace the thyroid hormones that your body does not make. This information  is not intended to replace advice given to you by your health care provider. Make sure you discuss any questions you have with your health care provider. Document Released: 01/10/2005 Document Revised: 12/23/2016 Document Reviewed: 12/21/2016 Elsevier Patient Education  2020 [employer]  
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
## 2018-09-11 DIAGNOSIS — H0012 Chalazion right lower eyelid: Secondary | ICD-10-CM | POA: Diagnosis not present

## 2018-09-14 DIAGNOSIS — F4323 Adjustment disorder with mixed anxiety and depressed mood: Secondary | ICD-10-CM | POA: Diagnosis not present

## 2018-09-15 NOTE — Progress Notes (Signed)
Cardiology Office Note Date:  09/18/2018  Patient ID:  Corey [name], Corey [name] 1935-03-19, MRN [id number] PCP:  Janith Lima, MD  Electrophysiologist:  Dr. Rayann Heman     Chief Complaint: annual viist  History of Present Illness: Corey [name] is a 83 y.o. male with history of HTN, HLD, NICM, chronic CHF (combined), ICD, ETOH abuse, GERD, esophageal stricture, CVA,/TIAs,  DM, OSA, AFib, CKD (III).  He comes in today to be seen for Dr. Rayann Heman.  Last seen by him, 02/2018, at that time doing well, AFib burden <1%, discussed consideration of reduction in his amio at follow up, was enrolled in [facility] clinic and urged avoidance of ETOH, planned for remotes and in clinic annually.   ER visit 03/19/2018 after a mechanical/trip fall with facial trauma, b/l knee abrasions [place]: ER visit 07/06/2018 fall,pt reported he was runk, fell down stairs, during this visit reported tremors when not drinking, wife reported daily drinking.  Had scalp laceration repaired with dermabond. 07/17/2018: ER visit and admission with fall, dental injury and ETOH intoxication  In-patient treatment facility in [place], saw PMD 09/10/2018 with reports of no ETOH in 2 mo.  He was instructed no driving.  [id number]: Creat 1.43 LFTs wnl H/H 13/40 TSH 1.67  The patient is accompanied by his wife.  She brings in a consult that was done while the patient was in an in-patient ETOH addition program, noting cognitive decline, multifactorial though likely from decades of ETOH abuse. He has done well since home, though both the patient and wife understand the fragility of his recovery and have made the difficult decision to sell their home and move to [place] to live near their daughters there for additional family support. Time line is likely November or so.  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  No CP, palpitations or SOB, no symptoms of PND or orthopnea.  No bleeding or signs of bleeding with his Eliquis.  No dizzy spells, no near syncope or syncope.  No Shocks.   Device information STJ CRT-D, RA lead (BSCi lead) 07/08/11, device, RV/LV leads  (SJM) 01/16/12 (primary prevention) initially pt had BSCi PPM implanted 07/08/11 for symptomatic bradycardia RV pacing lead was removed at time of ICD implant  Past Medical History:  Diagnosis Date  . Alcohol abuse, episodic 05/19/2008  . ALLERGIC RHINITIS 04/15/2009  . Allergy    YEAR ROUND,TAKE SHOTS  . Alzheimer's dementia ([place])    beginning with sx  . Anxiety   . Arthritis   . ASTHMA 11/16/2006   sinusitis-    hx ?yeast patch/white patch on vocal cord as per [facility] 02/25/11-   . Atrial fibrillation ([place]) 11/16/2006   remote CHF related to atrial fib with rapid ventricular response over 25 yrs per office note,  . Cancer [facility]) 2002   prostate cancer  . Chronic systolic CHF (congestive heart failure) (HCC)    echo (10/02/12): EF 123456, grade 1 diastolic dysfunction, trivial AI, mild MR, mild RVE  . COLONIC POLYPS, ADENOMATOUS, HX OF 02/14/2008  . CORONARY ARTERY DISEASE 11/16/2006   LHC (07/2011): LAD with luminal irregularities, proximal circumflex 50%, EF 25%  . Depression   . DIVERTICULOSIS, COLON 02/14/2008  . ESOPHAGEAL STRICTURE 01/28/2008  . GERD 02/14/2008  . Headache(784.0)   . HYPERGLYCEMIA 11/16/2006  . HYPERLIPIDEMIA 02/14/2008  .  HYPERTENSION 11/16/2006  . ICD (implantable cardiac defibrillator) in place   . INSOMNIA-SLEEP DISORDER-UNSPEC 02/11/2009  . Lesion of vocal cord    bx begine  . Myocardial infarction ([place]) 1993  . NICM (nonischemic cardiomyopathy) ([place])   . OBSTRUCTIVE SLEEP APNEA 11/10/2008  . 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 Pacemaker    St. Jude  . Pneumonia    hx of  . PROSTATE CANCER, HX OF 02/25/2000  . SLEEP APNEA 10/03/2009   LOV Dr [name] 12/12 in EPIC    Moderate per patient-  settings ?6/last sleep study years ago  . Sleep apnea   . Stroke ([place])    Tia  . Symptomatic bradycardia, secondary to sinus node dysfunction 07/08/2011   s/p [facility] Scientific PPM by Dr Sallyanne Kuster, upgraded to [place] ICD 12/13 by Dr Rayann Heman (SJM)  . TRANSIENT ISCHEMIC ATTACKS, HX OF 11/16/2006  . Type 2 diabetes mellitus with complication, without long-term current use of insulin ([place]) 11/19/2016    Past Surgical History:  Procedure Laterality Date  . BACK SURGERY    . BI-VENTRICULAR IMPLANTABLE CARDIOVERTER DEFIBRILLATOR UPGRADE N/A 01/16/2012   Procedure: BI-VENTRICULAR IMPLANTABLE CARDIOVERTER DEFIBRILLATOR UPGRADE;  Surgeon: Thompson Grayer, MD;  Location: [facility] CATH LAB;  Service: Cardiovascular;  Laterality: N/A;  . CARDIAC CATHETERIZATION     several  . CARDIAC DEFIBRILLATOR PLACEMENT  01/16/12   Upgrade to a biventricular SJM ICD by DR Allred  . COLONOSCOPY    . ESOPHAGOGASTRODUODENOSCOPY     with dilitation  . INSERT / REPLACE / REMOVE PACEMAKER    . KNEE ARTHROSCOPY     2 surgeries right and 1 left  . LEFT AND RIGHT HEART CATHETERIZATION WITH CORONARY ANGIOGRAM N/A 08/18/2011   Procedure: LEFT AND RIGHT HEART CATHETERIZATION WITH CORONARY ANGIOGRAM;  Surgeon: [name], MD;  Location: [place] CATH LAB;  Service: Cardiovascular;  Laterality: N/A;  . LUMBAR LAMINECTOMY/DECOMPRESSION MICRODISCECTOMY  03/02/2011   Procedure: LUMBAR LAMINECTOMY/DECOMPRESSION MICRODISCECTOMY;  Surgeon: [name], MD;  Location: [facility] ORS;  Service: Orthopedics;  Laterality: N/A;  Decompression Lumbar 4 - Lumbar(X-Ray)  . PACEMAKER INSERTION  06/2011   [employer] PPM implanted by Dr Sallyanne Kuster  . PERMANENT PACEMAKER INSERTION Left 07/08/2011   Procedure: PERMANENT PACEMAKER INSERTION;  Surgeon: [name], MD;  Location: [place] CATH LAB;  Service: Cardiovascular;  Laterality: Left;  . PROSTATE SURGERY  2/02   prostate cancer  . TONSILECTOMY, ADENOIDECTOMY, BILATERAL MYRINGOTOMY AND TUBES    .  TONSILLECTOMY  as child  . TOTAL KNEE ARTHROPLASTY Left 06/04/2012   Procedure: TOTAL KNEE ARTHROPLASTY;  Surgeon: [name], MD;  Location: [facility] ORS;  Service: Orthopedics;  Laterality: Left;  . UVULOPALATOPHARYNGOPLASTY  2013  . vocal cord lesion bx      Current Outpatient Medications  Medication Sig Dispense Refill  . amiodarone (PACERONE) 200 MG tablet Take 0.5 tablets (100 mg total) by mouth daily. 45 tablet 3  . apixaban (ELIQUIS) 2.5 MG TABS tablet Take 1 tablet (2.5 mg total) by mouth 2 (two) times daily. 60 tablet 5  . Cholecalciferol (VITAMIN D3 PO) Take 4,000 Units by mouth daily.    . folic acid (FOLVITE) 1 MG tablet TAKE ONE TABLET EACH DAY 90 tablet 1  . levalbuterol (XOPENEX) 1.25 MG/3ML nebulizer solution Inhale into the lungs as needed.     Marland Kitchen levothyroxine (SYNTHROID, LEVOTHROID) 75 MCG tablet Take 1 tablet (75 mcg total) by mouth daily. 90 tablet 0  . metFORMIN (GLUMETZA) 500 MG (MOD) 24 hr tablet Take 1,000 mg  by mouth daily before breakfast.    . metoprolol succinate (TOPROL-XL) 25 MG 24 hr tablet Take 1 tablet (25 mg total) by mouth daily. 90 tablet 3  . mometasone (NASONEX) 50 MCG/ACT nasal spray 1-2 puffs each nostril once or twice daily if needed (Patient taking differently: as needed. 1-2 puffs each nostril once or twice daily if needed) 17 g 0  . Multiple Vitamins-Minerals (CEROVITE PO) Take by mouth. Once daily with food    . pantoprazole (PROTONIX) 40 MG tablet Take 1 tablet (40 mg total) by mouth daily. 90 tablet 0  . rosuvastatin (CRESTOR) 5 MG tablet Take 1 tablet (5 mg total) by mouth at bedtime. 90 tablet 3  . sertraline (ZOLOFT) 100 MG tablet Take 1.5 tablets (150 mg total) by mouth daily. 135 tablet 1  . spironolactone (ALDACTONE) 25 MG tablet TAKE ONE TABLET DAILY 90 tablet 2  . thiamine (VITAMIN B-1) 100 MG tablet TAKE ONE TABLET EACH DAY 90 tablet 1   No current facility-administered medications for this visit.     Allergies:   Altace [ramipril],  Hydrocodone, and Oxycodone   Social History:  The patient  reports that he quit smoking about 54 years ago. His smoking use included cigarettes. He has a 4.00 pack-year smoking history. He has never used smokeless tobacco. He reports current alcohol use of about 35.0 standard drinks of alcohol per week. He reports that he does not use drugs.   Family History:  The patient's family history includes Autism in his son; Heart attack in his brother, father, and maternal uncle; Heart disease in his maternal uncle; Sudden death (age of onset: 78) in his daughter.  ROS:  Please see the history of present illness.   All other systems are reviewed and otherwise negative.   PHYSICAL EXAM:  VS:  BP 122/64   Pulse 77   Ht 6' (1.829 m)   Wt 189 lb (85.7 kg)   BMI 25.63 kg/m  BMI: Body mass index is 25.63 kg/m. Well nourished, well developed, in no acute distress  HEENT: normocephalic, atraumatic  Neck: no JVD, carotid bruits or masses Cardiac:  RRR; no significant murmurs, no rubs, or gallops Lungs:  CTA b/l, no wheezing, rhonchi or rales  Abd: soft, nontender MS: no deformity or age appropriate atrophy Ext: no edema  Skin: warm and dry, no rash Neuro:  No gross deficits appreciated Psych: euthymic mood, full affect  ICD site is stable, no tethering or discomfort   EKG:  Not done today ICD interrogation done today and reviewed by myself: battery and lead measurements are good. No VT/VF, therapies. 10AM episodes, availa EGMs reviewed,+ AFib, longest 2hrs 27min, most recent was May 29, 2018 of 17 minutes [place] is at threshold, trending downward  A PMT episode looks ST/BP BiVe pacing % 93%, perhaps 2/2 to some AMS   12/22/15: TTE Study Conclusions - Left ventricle: The cavity size was normal. Wall thickness was   increased in a pattern of mild LVH. Systolic function was normal.   The estimated ejection fraction was in the range of 50% to 55%.   Regional wall motion abnormalities cannot  be excluded. Doppler   parameters are consistent with abnormal left ventricular   relaxation (grade 1 diastolic dysfunction). The E/e&' ratio is   between 8-15, suggesting indeterminate LV filling pressure. - Aorta: Ascending aortic diameter: 39 mm (S). - Ascending aorta: The ascending aorta was mildly dilated. - Left atrium: The atrium was normal in size. - Right atrium:  The atrium was mildly dilated. - Tricuspid valve: There was trivial regurgitation. - Pulmonary arteries: PA peak pressure: 22 mm Hg (S). - Inferior vena cava: The vessel was normal in size. The   respirophasic diameter changes were in the normal range (= 50%),   consistent with normal central venous pressure.  Impressions: - Compared to a prior echo in 2014, the LVEF has improved from   35-40% up to 50-55%. The ascending aorta is mildly dilated at 3.9   cm.  Recent Labs: 06/04/2018: ALT 30; BUN 18; Creatinine, Ser 1.43; Hemoglobin 13.6; Platelets 171.0; Potassium 4.6; Sodium 138; TSH 1.67  11/22/2017: Cholesterol 162; HDL 54.50; LDL Cholesterol 79; Total CHOL/HDL Ratio 3; Triglycerides 143.0; VLDL 28.6   CrCl cannot be calculated (Patient's most recent lab result is older than the maximum 21 days allowed.).   Wt Readings from Last 3 Encounters:  09/18/18 189 lb (85.7 kg)  09/10/18 188 lb (85.3 kg)  06/04/18 181 lb (82.1 kg)     Other studies reviewed: Additional studies/records reviewed today include: summarized above  ASSESSMENT AND PLAN:  1. ICD     Intact function,. No changes made  2. Paroxysmal AFib     CHA2DS2vasc is 7, on Eliquis  His Creat waxes/wanes, on low dose Eliquis, will check labs and see.  He may be better served with xarelto if Creat continues to wobble       Check eliquis and amio labs today He is just home from his rehab, stopping his AAD may not be the best timing with stressors and anxiety about maintaining his sobriety  3. NICM    Last echo in 2017 with recovered LVEF    CorVue  suggests he may be retaining fluid, no exam findings or symptoms to support that  4. HTN     Looks good, no changes  5. ETOH abuse     Congratulated on his huge success so far, encouraged to continue     Disposition: recommend Q 3 mo remotes, they will send a remote today to get back in the system and call the device clinic to make aware.  The patient's wife asks rather then a remote if we would see him in Nov, she would like thatbetter, by then she hopes to have a cardiologist in [place] arranged as well  Current medicines are reviewed at length with the patient today.  The patient did not have any concerns regarding medicines.  Venetia Night, PA-C 09/18/2018 11:10 AM     [facility] [address] [place] [address] [phone] (office)  [phone] (fax)

## 2018-09-18 ENCOUNTER — Encounter: Payer: Self-pay | Admitting: Physician Assistant

## 2018-09-18 ENCOUNTER — Other Ambulatory Visit: Payer: Self-pay | Admitting: *Deleted

## 2018-09-18 ENCOUNTER — Other Ambulatory Visit: Payer: Self-pay

## 2018-09-18 ENCOUNTER — Ambulatory Visit (INDEPENDENT_AMBULATORY_CARE_PROVIDER_SITE_OTHER): Payer: Medicare Other | Admitting: Physician Assistant

## 2018-09-18 VITALS — BP 122/64 | HR 77 | Ht 72.0 in | Wt 189.0 lb

## 2018-09-18 DIAGNOSIS — I428 Other cardiomyopathies: Secondary | ICD-10-CM

## 2018-09-18 DIAGNOSIS — Z79899 Other long term (current) drug therapy: Secondary | ICD-10-CM | POA: Diagnosis not present

## 2018-09-18 DIAGNOSIS — Z95810 Presence of automatic (implantable) cardiac defibrillator: Secondary | ICD-10-CM

## 2018-09-18 DIAGNOSIS — I480 Paroxysmal atrial fibrillation: Secondary | ICD-10-CM

## 2018-09-18 DIAGNOSIS — I10 Essential (primary) hypertension: Secondary | ICD-10-CM | POA: Diagnosis not present

## 2018-09-18 DIAGNOSIS — E785 Hyperlipidemia, unspecified: Secondary | ICD-10-CM

## 2018-09-18 DIAGNOSIS — I2510 Atherosclerotic heart disease of native coronary artery without angina pectoris: Secondary | ICD-10-CM

## 2018-09-18 LAB — BASIC METABOLIC PANEL
BUN/Creatinine Ratio: 16 (ref 10–24)
BUN: 24 mg/dL (ref 8–27)
CO2: 22 mmol/L (ref 20–29)
Calcium: 10.1 mg/dL (ref 8.6–10.2)
Chloride: 100 mmol/L (ref 96–106)
Creatinine, Ser: 1.54 mg/dL — ABNORMAL HIGH (ref 0.76–1.27)
GFR calc Af Amer: 49 mL/min/{1.73_m2} — ABNORMAL LOW (ref >59)
GFR calc non Af Amer: 42 mL/min/{1.73_m2} — ABNORMAL LOW (ref >59)
Glucose: 116 mg/dL — ABNORMAL HIGH (ref 65–99)
Potassium: 4.8 mmol/L (ref 3.5–5.2)
Sodium: 139 mmol/L (ref 134–144)

## 2018-09-18 LAB — HEPATIC FUNCTION PANEL
ALT: 25 IU/L (ref 0–44)
AST: 36 IU/L (ref 0–40)
Albumin: 4.3 g/dL (ref 3.7–4.7)
Alkaline Phosphatase: 94 IU/L (ref 39–117)
Bilirubin Total: 0.3 mg/dL (ref 0.0–1.2)
Bilirubin, Direct: 0.14 mg/dL (ref 0.00–0.40)
Total Protein: 7.2 g/dL (ref 6.0–8.5)

## 2018-09-18 LAB — CBC
Hematocrit: 39.7 % (ref 37.5–51.0)
Hemoglobin: 13.3 g/dL (ref 13.0–17.7)
MCH: 29.8 pg (ref 26.6–33.0)
MCHC: 33.5 g/dL (ref 31.5–35.7)
MCV: 89 fL (ref 79–97)
Platelets: 160 10*3/uL (ref 150–450)
RBC: 4.46 x10E6/uL (ref 4.14–5.80)
RDW: 11.9 % (ref 11.6–15.4)
WBC: 5.4 10*3/uL (ref 3.4–10.8)

## 2018-09-18 LAB — TSH: TSH: 1.03 u[IU]/mL (ref 0.450–4.500)

## 2018-09-18 MED ORDER — ROSUVASTATIN CALCIUM 5 MG PO TABS: 5.0000 mg | ORAL_TABLET | Freq: Every day | ORAL | 3 refills | Status: AC

## 2018-09-18 MED ORDER — AMIODARONE HCL 200 MG PO TABS: 100.0000 mg | ORAL_TABLET | Freq: Every day | ORAL | 3 refills | Status: AC

## 2018-09-18 MED ORDER — APIXABAN 2.5 MG PO TABS: 2.5000 mg | ORAL_TABLET | Freq: Two times a day (BID) | ORAL | 11 refills | Status: AC

## 2018-09-18 MED ORDER — METOPROLOL SUCCINATE ER 25 MG PO TB24: 25.0000 mg | ORAL_TABLET | Freq: Every day | ORAL | 3 refills | Status: AC

## 2018-09-18 NOTE — Patient Instructions (Signed)
Medication Instructions:  Your physician recommends that you continue on your current medications as directed. Please refer to the Current Medication list given to you today.  If you need a refill on your cardiac medications before your next appointment, please call your pharmacy.   Lab work: BMET CBC TSH AND LFT   If you have labs (blood work) drawn today and your tests are completely normal, you will receive your results only by: Marland Kitchen MyChart Message (if you have MyChart) OR . A paper copy in the mail If you have any lab test that is abnormal or we need to change your treatment, we will call you to review the results.  Testing/Procedures: Patient did not provide current insurance card for today's visit.  Patient was informed that a current copy of their insurance card is expected at time of service and will need to bring for any future appointments.  Follow-Up: At [facility], you and your health needs are our priority.  As part of our continuing mission to provide you with exceptional heart care, we have created designated Provider Care Teams.  These Care Teams include your primary Cardiologist (physician) and Advanced Practice Providers (APPs -  Physician Assistants and Nurse Practitioners) who all work together to provide you with the care you need, when you need it. You will need a follow up appointment in 3 months. You may see Dr. Rayann Heman  or one of the following Advanced Practice Providers on your designated Care Team:  Tommye Standard, PA-C  Any Other Special Instructions Will Be Listed Below (If Applicable). SEND REMOTE TRANSMISSION AND THEN CALL [phone] ASK FOR EMILY TO START REMOTE TRANSMISSION SCHEDULE

## 2018-09-18 NOTE — Progress Notes (Signed)
Cardiology Office Note Date:  09/18/2018  Patient ID:  Corey [name], Corey [name] August 08, 1935, MRN [id number] PCP:  Janith Lima, MD  Electrophysiologist:  Dr. Rayann Heman     Chief Complaint: annual viist  History of Present Illness: Corey [name] is a 83 y.o. male with history of HTN, HLD, NICM, chronic CHF (combined), ICD, ETOH abuse, GERD, esophageal stricture, CVA,/TIAs,  DM, OSA, AFib, CKD (III).  He comes in today to be seen for Dr. Rayann Heman.  Last seen by him, 02/2018, at that time doing well, AFib burden <1%, discussed consideration of reduction in his amio at follow up, was enrolled in [facility] clinic and urged avoidance of ETOH, planned for remotes and in clinic annually.   ER visit 03/19/2018 after a mechanical/trip fall with facial trauma, b/l knee abrasions [place]il lin [place]: ER visit 07/06/2018 fall,pt reported he was runk, fell down stairs, during this visit reported tremors when not drinking, wife reported daily drinking.  Had scalp laceration repaired with dermabond. 07/17/2018: ER visit and admission with fall, dental injury and ETOH intoxication  In-patient treatment facility in [place], saw PMD 09/10/2018 with reports of no ETOH in 2 mo.  He was instructed no driving.  [id number]: Creat 1.43 LFTs wnl H/H 13/40 TSH 1.67  The patient is accompanied by his wife.  She brings in a consult that was done while the patient was in an in-patient ETOH addition program, noting cognitive decline, multifactorial though likely from decades of ETOH abuse. He has done well since home, though both the patient and wife understand the fragility of his recovery and have made the difficult decision to sell their home and move to [place] to live near their daughters there for additional family support. Time line is likely November or so.  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  No CP, palpitations or SOB, no symptoms of PND or orthopnea.  No bleeding or signs of bleeding with his Eliquis.  No dizzy spells, no near syncope or syncope.  No Shocks.   Device information STJ CRT-D, RA lead (BSCi lead) 07/08/11, device, RV/LV leads  (SJM) 01/16/12 (primary prevention) initially pt had BSCi PPM implanted 07/08/11 for symptomatic bradycardia RV pacing lead was removed at time of ICD implant     Past Medical History:  Diagnosis Date  . Alcohol abuse, episodic 05/19/2008  . ALLERGIC RHINITIS 04/15/2009  . Allergy    YEAR ROUND,TAKE SHOTS  . Alzheimer's dementia ([place])    beginning with sx  . Anxiety   . Arthritis   . ASTHMA 11/16/2006   sinusitis-    hx ?yeast patch/white patch on vocal cord as per [facility] 02/25/11-   . Atrial fibrillation ([place]) 11/16/2006   remote CHF related to atrial fib with rapid ventricular response over 25 yrs per office note,  . Cancer [facility]) 2002   prostate cancer  . Chronic systolic CHF (congestive heart failure) (HCC)    echo (10/02/12): EF 123456, grade 1 diastolic dysfunction, trivial AI, mild MR, mild RVE  . COLONIC POLYPS, ADENOMATOUS, HX OF 02/14/2008  . CORONARY ARTERY DISEASE 11/16/2006   LHC (07/2011): LAD with luminal irregularities, proximal circumflex 50%, EF 25%  . Depression   . DIVERTICULOSIS, COLON 02/14/2008  . ESOPHAGEAL STRICTURE 01/28/2008  . GERD 02/14/2008  . Headache(784.0)   . HYPERGLYCEMIA 11/16/2006  . HYPERLIPIDEMIA  02/14/2008  . HYPERTENSION 11/16/2006  . ICD (implantable cardiac defibrillator) in place   . INSOMNIA-SLEEP DISORDER-UNSPEC 02/11/2009  . Lesion of vocal cord    bx begine  . Myocardial infarction ([place]) 1993  . NICM (nonischemic cardiomyopathy) ([place])   . OBSTRUCTIVE SLEEP APNEA 11/10/2008  . 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 Pacemaker    St. Jude  . Pneumonia    hx of  . PROSTATE CANCER, HX OF 02/25/2000  . SLEEP APNEA 10/03/2009   LOV Dr [name] 12/12 in EPIC    Moderate per  patient- settings ?6/last sleep study years ago  . Sleep apnea   . Stroke ([place])    Tia  . Symptomatic bradycardia, secondary to sinus node dysfunction 07/08/2011   s/p [facility] Scientific PPM by Dr Sallyanne Kuster, upgraded to Lapel ICD 12/13 by Dr Rayann Heman (SJM)  . TRANSIENT ISCHEMIC ATTACKS, HX OF 11/16/2006  . Type 2 diabetes mellitus with complication, without long-term current use of insulin ([place]) 11/19/2016    Past Surgical History:  Procedure Laterality Date  . BACK SURGERY    . BI-VENTRICULAR IMPLANTABLE CARDIOVERTER DEFIBRILLATOR UPGRADE N/A 01/16/2012   Procedure: BI-VENTRICULAR IMPLANTABLE CARDIOVERTER DEFIBRILLATOR UPGRADE;  Surgeon: Thompson Grayer, MD;  Location: [facility] CATH LAB;  Service: Cardiovascular;  Laterality: N/A;  . CARDIAC CATHETERIZATION     several  . CARDIAC DEFIBRILLATOR PLACEMENT  01/16/12   Upgrade to a biventricular SJM ICD by DR Allred  . COLONOSCOPY    . ESOPHAGOGASTRODUODENOSCOPY     with dilitation  . INSERT / REPLACE / REMOVE PACEMAKER    . KNEE ARTHROSCOPY     2 surgeries right and 1 left  . LEFT AND RIGHT HEART CATHETERIZATION WITH CORONARY ANGIOGRAM N/A 08/18/2011   Procedure: LEFT AND RIGHT HEART CATHETERIZATION WITH CORONARY ANGIOGRAM;  Surgeon: [name], MD;  Location: [facility] CATH LAB;  Service: Cardiovascular;  Laterality: N/A;  . LUMBAR LAMINECTOMY/DECOMPRESSION MICRODISCECTOMY  03/02/2011   Procedure: LUMBAR LAMINECTOMY/DECOMPRESSION MICRODISCECTOMY;  Surgeon: [name], MD;  Location: [facility] ORS;  Service: Orthopedics;  Laterality: N/A;  Decompression Lumbar 4 - Lumbar(X-Ray)  . PACEMAKER INSERTION  06/2011   [employer] PPM implanted by Dr Sallyanne Kuster  . PERMANENT PACEMAKER INSERTION Left 07/08/2011   Procedure: PERMANENT PACEMAKER INSERTION;  Surgeon: [name], MD;  Location: [facility] CATH LAB;  Service: Cardiovascular;  Laterality: Left;  . PROSTATE SURGERY  2/02   prostate cancer  . TONSILECTOMY, ADENOIDECTOMY, BILATERAL MYRINGOTOMY AND TUBES    .  TONSILLECTOMY  as child  . TOTAL KNEE ARTHROPLASTY Left 06/04/2012   Procedure: TOTAL KNEE ARTHROPLASTY;  Surgeon: [name], MD;  Location: [facility] ORS;  Service: Orthopedics;  Laterality: Left;  . UVULOPALATOPHARYNGOPLASTY  2013  . vocal cord lesion bx      Current Outpatient Medications  Medication Sig Dispense Refill  . amiodarone (PACERONE) 200 MG tablet Take 0.5 tablets (100 mg total) by mouth daily. 45 tablet 3  . apixaban (ELIQUIS) 2.5 MG TABS tablet Take 1 tablet (2.5 mg total) by mouth 2 (two) times daily. 60 tablet 5  . Cholecalciferol (VITAMIN D3 PO) Take 4,000 Units by mouth daily.    . folic acid (FOLVITE) 1 MG tablet TAKE ONE TABLET EACH DAY 90 tablet 1  . levalbuterol (XOPENEX) 1.25 MG/3ML nebulizer solution Inhale into the lungs as needed.     Marland Kitchen levothyroxine (SYNTHROID, LEVOTHROID) 75 MCG tablet Take 1 tablet (75 mcg total) by mouth daily. 90 tablet 0  . metFORMIN (GLUMETZA) 500 MG (MOD) 24 hr tablet  Take 1,000 mg by mouth daily before breakfast.    . metoprolol succinate (TOPROL-XL) 25 MG 24 hr tablet Take 1 tablet (25 mg total) by mouth daily. 90 tablet 3  . mometasone (NASONEX) 50 MCG/ACT nasal spray 1-2 puffs each nostril once or twice daily if needed (Patient taking differently: as needed. 1-2 puffs each nostril once or twice daily if needed) 17 g 0  . Multiple Vitamins-Minerals (CEROVITE PO) Take by mouth. Once daily with food    . pantoprazole (PROTONIX) 40 MG tablet Take 1 tablet (40 mg total) by mouth daily. 90 tablet 0  . rosuvastatin (CRESTOR) 5 MG tablet Take 1 tablet (5 mg total) by mouth at bedtime. 90 tablet 3  . sertraline (ZOLOFT) 100 MG tablet Take 1.5 tablets (150 mg total) by mouth daily. 135 tablet 1  . spironolactone (ALDACTONE) 25 MG tablet TAKE ONE TABLET DAILY 90 tablet 2  . thiamine (VITAMIN B-1) 100 MG tablet TAKE ONE TABLET EACH DAY 90 tablet 1   No current facility-administered medications for this visit.     Allergies:   Altace [ramipril],  Hydrocodone, and Oxycodone   Social History:  The patient  reports that he quit smoking about 54 years ago. His smoking use included cigarettes. He has a 4.00 pack-year smoking history. He has never used smokeless tobacco. He reports current alcohol use of about 35.0 standard drinks of alcohol per week. He reports that he does not use drugs.   Family History:  The patient's family history includes Autism in his son; Heart attack in his brother, father, and maternal uncle; Heart disease in his maternal uncle; Sudden death (age of onset: 60) in his daughter.  ROS:  Please see the history of present illness.   All other systems are reviewed and otherwise negative.   PHYSICAL EXAM:  VS:  BP 122/64   Pulse 77   Ht 6' (1.829 m)   Wt 189 lb (85.7 kg)   BMI 25.63 kg/m  BMI: Body mass index is 25.63 kg/m. Well nourished, well developed, in no acute distress  HEENT: normocephalic, atraumatic  Neck: no JVD, carotid bruits or masses Cardiac:  RRR; no significant murmurs, no rubs, or gallops Lungs:  CTA b/l, no wheezing, rhonchi or rales  Abd: soft, nontender MS: no deformity or age appropriate atrophy Ext: no edema  Skin: warm and dry, no rash Neuro:  No gross deficits appreciated Psych: euthymic mood, full affect  ICD site is stable, no tethering or discomfort   EKG:  Not done today ICD interrogation done today and reviewed by myself: battery and lead measurements are good. No VT/VF, therapies. 10AM episodes, availa EGMs reviewed,+ AFib, longest 2hrs 2min, most recent was May 29, 2018,  17 minutes [place] is at threshold, trending downward  A PMT episode looks ST/BP BiVe pacing % 93%, perhaps 2/2 to some AMS   12/22/15: TTE Study Conclusions - Left ventricle: The cavity size was normal. Wall thickness was   increased in a pattern of mild LVH. Systolic function was normal.   The estimated ejection fraction was in the range of 50% to 55%.   Regional wall motion abnormalities cannot  be excluded. Doppler   parameters are consistent with abnormal left ventricular   relaxation (grade 1 diastolic dysfunction). The E/e&' ratio is   between 8-15, suggesting indeterminate LV filling pressure. - Aorta: Ascending aortic diameter: 39 mm (S). - Ascending aorta: The ascending aorta was mildly dilated. - Left atrium: The atrium was normal in size. -  Right atrium: The atrium was mildly dilated. - Tricuspid valve: There was trivial regurgitation. - Pulmonary arteries: PA peak pressure: 22 mm Hg (S). - Inferior vena cava: The vessel was normal in size. The   respirophasic diameter changes were in the normal range (= 50%),   consistent with normal central venous pressure.  Impressions: - Compared to a prior echo in 2014, the LVEF has improved from   35-40% up to 50-55%. The ascending aorta is mildly dilated at 3.9   cm.  Recent Labs: 06/04/2018: ALT 30; BUN 18; Creatinine, Ser 1.43; Hemoglobin 13.6; Platelets 171.0; Potassium 4.6; Sodium 138; TSH 1.67  11/22/2017: Cholesterol 162; HDL 54.50; LDL Cholesterol 79; Total CHOL/HDL Ratio 3; Triglycerides 143.0; VLDL 28.6   CrCl cannot be calculated (Patient's most recent lab result is older than the maximum 21 days allowed.).   Wt Readings from Last 3 Encounters:  09/18/18 189 lb (85.7 kg)  09/10/18 188 lb (85.3 kg)  06/04/18 181 lb (82.1 kg)     Other studies reviewed: Additional studies/records reviewed today include: summarized above  ASSESSMENT AND PLAN:  1. ICD     Intact function, no programming changes made  2. Paroxysmal AFib     CHA2DS2vasc is 7, on Eliquis     On amio  His Eliquis is dosed 2.5mg  BID, his Creat seems to wax/wane  Will update his labs today (eliquis and amio), he may be better served with xarelto if Creat continues to wobble        3. NICM    Last echo in 2017 with recovered LVEF     CorVu suggests fluid accumulation (trending upwards)     No symptoms or exam findings of fluid OL   4. HTN      Looks OK, no changes   5. ETOH abuse     Just completed a residential program     Encouraged and congratulated for the huge step    Disposition: labs today for his amio and eliquis.  Stopping AAD while he is just getting back into his home and life with these stressors may be poor timing for him.  His wife would like him to have in-clinic visits, will see him back in 63mo, sooner if needed  Current medicines are reviewed at length with the patient today.  The patient did not have any concerns regarding medicines.  Venetia Night, PA-C 09/18/2018 1:14 PM     [address] [address] [phone] (office)  [phone] (fax)

## 2018-09-19 ENCOUNTER — Encounter: Payer: Self-pay | Admitting: Internal Medicine

## 2018-09-21 ENCOUNTER — Encounter: Payer: Self-pay | Admitting: *Deleted

## 2018-09-21 ENCOUNTER — Other Ambulatory Visit: Payer: Self-pay | Admitting: *Deleted

## 2018-09-21 ENCOUNTER — Other Ambulatory Visit: Payer: Self-pay | Admitting: Internal Medicine

## 2018-09-21 DIAGNOSIS — Z79899 Other long term (current) drug therapy: Secondary | ICD-10-CM

## 2018-09-26 NOTE — Progress Notes (Signed)
NEUROLOGY FOLLOW UP OFFICE NOTE  Corey [name] HD:[id number]  HISTORY OF PRESENT ILLNESS: Corey [name] is an 83 year old right-handed man with paroxysmal atrial fibrillation status post pacemaker, coronary artery disease status post MI, hypertension, OSA, hyperlipidemia, alcohol abuse, and history of TIA and prostate cancer who follows up for cognitive impairment.  He is accompanied by his wife who supplements history.  UPDATE: Current medications:  Sertraline 150mg  daily, B1 100mg  daily, folic acid 1mg  daily, Eliquis, Toprol-XL, spironolactone, Crestor 5mg   Over the past year, he has had several falls in setting of alcohol intoxication.  In June, he had a fall in which he sustained a dental avulsion and fracture.  He sought treatment for alcoholism at [facility] treatment center.  He had neuropsychological testing demonstrated mild neurocognitive impairment associated with chronic alcoholism and depression.  Since completing treatment, he has continued his sobriety.  He and his wife plan to move to [place] to live near their children and grandchildren.    HISTORY: His wife and daughters note some mild memory problems for several years, but has significantly been more noticeable over the past 5 months, since he retired in June 2016.Memory has been the primary problem.He was called his daughters several times in a day asking about the same thing.He still drives but is now mildly disoriented driving around home.There has been no accidents or near-accidents, but he is more irritable and impulsive, and will drive a little faster and tail gate.He has poor night vision.They have a new grandson.Recently, he forgot about a party for his new grandchild and instead made plans with friends.He has begun to forget names of family and friends.He has not had any significant change in personality other than irritability.Besides the aggressive driving, he uses foul language a little more.He  does not act inappropriately.He does feel depressed, as he has been going through an adjustment since retiring.He has had more falls recently.One time, the rug slipped from underneath him.Another time, he tripped over a hose at the gas station.He also had experienced bowel incontinence.He has not had any problems at work prior to retirement.He has not had hallucinations or delusions. He has not had difficulty performing everyday tasks.He notes some problems but doesn't think it is as bad as his family makes it out to be.  He worked in [employer] for many years.Since retirement, he typically doesn't do too much.He will sit around.He doesn't watch much TV during the day.He reads the newspaper daily and able to retain information.He is able to stay focus.He and his wife are not as social as they used to be.  His mother had dementia.   Over the past several years, he has had several operations, including pacemaker and back surgery.He attributes these changes to his multiple surgeries.He also has depression.He has been depressed for several years, triggered by several events in his life, such as pain due to multiple back surgeries and the passing of his daughter.It has worsened particularly since 2015.He and his wife traveled around the country but he didn't quite seem to enjoy it as much as she should.He doesn't have any desire to go out and be active or social.His diet was poor, and he was prone to skipping meals.His sleep was poor.He became more irritable.  He did not tolerate Aricept due to vivid dreams and he was switched to Exelon. CT of head from 10/05/14 showed generalized atrophy and chronic small vessel ischemic changes. He underwent neuropsychological testing with Dr. Si Raider on 09/04/15. Results were largely within normal limits and not  consistent with dementia, however findings suggested non-amnestic mild cognitive impairment as demonstrated by some  deficits in visual-spatial construction and language and immediate encoding. It was believed that his depression, psychosocial stressors and alcohol consumption played a large role in cognitive difficulties. Repeat neuropsychological testing on 10/30/17 which demonstrated interval decline in his non-amnestic mild cognitive impairment.    03/22/17 LABS: B12 463, folate over 23.6, B1 11, TSH 4.15 04/21/17: CT head showed chronic small vessel disease with chronic left MCA infarct  PAST MEDICAL HISTORY: Past Medical History:  Diagnosis Date   Alcohol abuse, episodic 05/19/2008   ALLERGIC RHINITIS 04/15/2009   Allergy    YEAR ROUND,TAKE SHOTS   Alzheimer's dementia ([place])    beginning with sx   Anxiety    Arthritis    ASTHMA 11/16/2006   sinusitis-    hx ?yeast patch/white patch on vocal cord as per [facility] 02/25/11-    Atrial fibrillation ([place]) 11/16/2006   remote CHF related to atrial fib with rapid ventricular response over 25 yrs per office note,   Cancer ([place]) 2002   prostate cancer   Chronic systolic CHF (congestive heart failure) ([place])    echo (10/02/12): EF 123456, grade 1 diastolic dysfunction, trivial AI, mild MR, mild RVE   COLONIC POLYPS, ADENOMATOUS, HX OF 02/14/2008   CORONARY ARTERY DISEASE 11/16/2006   LHC (07/2011): LAD with luminal irregularities, proximal circumflex 50%, EF 25%   Depression    DIVERTICULOSIS, COLON 02/14/2008   ESOPHAGEAL STRICTURE 01/28/2008   GERD 02/14/2008   Headache(784.0)    HYPERGLYCEMIA 11/16/2006   HYPERLIPIDEMIA 02/14/2008   HYPERTENSION 11/16/2006   ICD (implantable cardiac defibrillator) in place    INSOMNIA-SLEEP [facility] 02/11/2009   Lesion of vocal cord    bx begine   Myocardial infarction ([place]) 1993   NICM (nonischemic cardiomyopathy) ([place])    OBSTRUCTIVE SLEEP APNEA 11/10/2008   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 Jude   Pneumonia    hx of   PROSTATE CANCER, HX OF 02/25/2000    SLEEP APNEA 10/03/2009   LOV Dr [name] 12/12 in EPIC    Moderate per patient- settings ?6/last sleep study years ago   Sleep apnea    Stroke ([place])    Tia   Symptomatic bradycardia, secondary to sinus node dysfunction 07/08/2011   s/p [employer] PPM by Dr Sallyanne Kuster, upgraded to [place] ICD 12/13 by Dr Rayann Heman ([facility])   [place], HX OF 11/16/2006   Type 2 diabetes mellitus with complication, without long-term current use of insulin ([place]) 11/19/2016    MEDICATIONS: Current Outpatient Medications on File Prior to Visit  Medication Sig Dispense Refill   amiodarone (PACERONE) 200 MG tablet Take 0.5 tablets (100 mg total) by mouth daily. 45 tablet 3   apixaban (ELIQUIS) 2.5 MG TABS tablet Take 1 tablet (2.5 mg total) by mouth 2 (two) times daily. 60 tablet 11   Cholecalciferol (VITAMIN D3 PO) Take 4,000 Units by mouth daily.     folic acid (FOLVITE) 1 MG tablet TAKE ONE TABLET DAILY 90 tablet 1   levalbuterol (XOPENEX) 1.25 MG/3ML nebulizer solution Inhale into the lungs as needed.      levothyroxine (SYNTHROID, LEVOTHROID) 75 MCG tablet Take 1 tablet (75 mcg total) by mouth daily. 90 tablet 0   metFORMIN (GLUMETZA) 500 MG (MOD) 24 hr tablet Take 1,000 mg by mouth daily before breakfast.     metoprolol succinate (TOPROL-XL) 25 MG 24 hr tablet Take 1 tablet (25 mg total)  by mouth daily. 90 tablet 3   mometasone (NASONEX) 50 MCG/ACT nasal spray 1-2 puffs each nostril once or twice daily if needed (Patient taking differently: as needed. 1-2 puffs each nostril once or twice daily if needed) 17 g 0   Multiple Vitamins-Minerals (CEROVITE PO) Take by mouth. Once daily with food     pantoprazole (PROTONIX) 40 MG tablet Take 1 tablet (40 mg total) by mouth daily. 90 tablet 0   rosuvastatin (CRESTOR) 5 MG tablet Take 1 tablet (5 mg total) by mouth at bedtime. 90 tablet 3   sertraline (ZOLOFT) 100 MG tablet Take 1.5 tablets (150 mg total) by mouth daily. 135 tablet 1    spironolactone (ALDACTONE) 25 MG tablet TAKE ONE TABLET DAILY 90 tablet 2   thiamine (VITAMIN B-1) 100 MG tablet TAKE ONE TABLET EACH DAY 90 tablet 1   No current facility-administered medications on file prior to visit.     ALLERGIES: Allergies  Allergen Reactions   Altace [Ramipril] Cough   Hydrocodone Other (See Comments)    Severe panic attacks   Oxycodone Other (See Comments)    Severe panic attacks    FAMILY HISTORY: Family History  Problem Relation Age of Onset   Heart attack Father    Heart attack Brother    Heart disease Maternal Uncle    Heart attack Maternal Uncle    Sudden death Daughter 23       unknown   Autism Son    Colon cancer Neg Hx    Esophageal cancer Neg Hx    Rectal cancer Neg Hx    Prostate cancer Neg Hx    Stomach cancer Neg Hx     SOCIAL HISTORY: Social History   Socioeconomic History   Marital status: Married    Spouse name: Corey [name]   Number of children: 4   Years of education: Not on file   Highest education level: Bachelor's degree (e.g., BA, AB, BS)  Occupational History   Occupation: Retired  Scientist, product/process development strain: Not hard at [employer]    Worry: Never true    Inability: Never true   Transportation needs    Medical: No    Non-medical: No  Tobacco Use   Smoking status: Former Smoker    Packs/day: 1.00    Years: 4.00    Pack years: 4.00    Types: Cigarettes    Quit date: 03/22/1964    Years since quitting: 54.5   Smokeless tobacco: Never Used   Tobacco comment: reports smoked socially  Substance and Sexual Activity   Alcohol use: Yes    Alcohol/week: 35.0 standard drinks    Types: 35 Shots of liquor per week    Comment: 2 mixed drinks daily, some days   Drug use: No   Sexual activity: Yes    Partners: Female  Lifestyle   Physical activity    Days per week: 4 days    Minutes per session: 40 min   Stress: Only a little  Relationships   Social connections      Talks on phone: More than three times a week    Gets together: More than three times a week    Attends religious service: More than 4 times per year    Active member of club or organization: Not on file    Attends meetings of clubs or organizations: More than 4 times per year    Relationship status: Married   Intimate partner violence  Fear of current or ex partner: No    Emotionally abused: No    Physically abused: Not on file    Forced sexual activity: No  Other Topics Concern   Not on file  Social History Narrative   Pt is right handed. He drinks coffee 3 x week, and tea 4 x week. He walks occasionally.    REVIEW OF SYSTEMS: Constitutional: No fevers, chills, or sweats, no generalized fatigue, change in appetite Eyes: No visual changes, double vision, eye pain Ear, nose and throat: No hearing loss, ear pain, nasal congestion, sore throat Cardiovascular: No chest pain, palpitations Respiratory:  No shortness of breath at rest or with exertion, wheezes GastrointestinaI: No nausea, vomiting, diarrhea, abdominal pain, fecal incontinence Genitourinary:  No dysuria, urinary retention or frequency Musculoskeletal:  No neck pain, back pain Integumentary: No rash, pruritus, skin lesions Neurological: as above Psychiatric: No depression, insomnia, anxiety Endocrine: No palpitations, fatigue, diaphoresis, mood swings, change in appetite, change in weight, increased thirst Hematologic/Lymphatic:  No purpura, petechiae. Allergic/Immunologic: no itchy/runny eyes, nasal congestion, recent allergic reactions, rashes  PHYSICAL EXAM: Blood pressure 119/75, pulse 80, temperature (!) 97.4 F (36.3 C), height 6' (1.829 m), weight 191 lb (86.6 kg), SpO2 97 %. General: No acute distress.  Patient appears well-groomed.   Head:  Normocephalic/atraumatic Eyes:  Fundi examined but not visualized Neck: supple, no paraspinal tenderness, full range of motion Heart:  Regular rate and  rhythm Lungs:  Clear to auscultation bilaterally Back: No paraspinal tenderness Neurological Exam: alert and oriented to person, place, and time. Attention span and concentration intact, delayed recall impaired, remote memory intact, fund of knowledge intact.  Speech fluent and not dysarthric, language intact.   MMSE - Mini Mental State Exam 09/27/2018 06/21/2017 08/26/2015 10/06/2014  Not completed: - (No Data) - -  Orientation to time 5 - 5 5  Orientation to Place 5 - 5 5  Registration 3 - 3 3  Attention/ Calculation 5 - 5 5  Recall 1 - 2 3  Language- name 2 objects 2 - 2 2  Language- repeat 1 - 1 1  Language- follow 3 step command 3 - 3 3  Language- read & follow direction 1 - 1 1  Write a sentence 1 - 1 1  Copy design 1 - 1 1  Total score 28 - 29 30   CN II-XII intact. Bulk and tone normal, muscle strength 5/5 throughout.  Sensation to light touch  intact.  Deep tendon reflexes 2+ throughout.  Finger to nose testing intact.  Gait normal  IMPRESSION: Mild cognitive impairment secondary to chronic alcoholism Depression  They plan to move to [facility] in the next month.  They will establish care with a PCP in the area.  25 minutes spent face to face with patient, over 50% spent discussing recent events.  Metta Clines, DO

## 2018-09-27 ENCOUNTER — Other Ambulatory Visit: Payer: Self-pay

## 2018-09-27 ENCOUNTER — Encounter: Payer: Self-pay | Admitting: Neurology

## 2018-09-27 ENCOUNTER — Ambulatory Visit (INDEPENDENT_AMBULATORY_CARE_PROVIDER_SITE_OTHER): Payer: Medicare Other | Admitting: Neurology

## 2018-09-27 VITALS — BP 119/75 | HR 80 | Temp 97.4°F | Ht 72.0 in | Wt 191.0 lb

## 2018-09-27 DIAGNOSIS — G3184 Mild cognitive impairment, so stated: Secondary | ICD-10-CM

## 2018-09-27 DIAGNOSIS — I2510 Atherosclerotic heart disease of native coronary artery without angina pectoris: Secondary | ICD-10-CM

## 2018-09-27 DIAGNOSIS — F1021 Alcohol dependence, in remission: Secondary | ICD-10-CM

## 2018-09-27 NOTE — Patient Instructions (Signed)
I wish you best of luck and applaud everything you have accomplished.  Enjoy your children and grandchildren.  If you need anything, please do not hesitate to contact me.

## 2018-10-08 DIAGNOSIS — F4323 Adjustment disorder with mixed anxiety and depressed mood: Secondary | ICD-10-CM | POA: Diagnosis not present

## 2018-10-23 DIAGNOSIS — H0012 Chalazion right lower eyelid: Secondary | ICD-10-CM | POA: Diagnosis not present

## 2018-10-23 DIAGNOSIS — H2513 Age-related nuclear cataract, bilateral: Secondary | ICD-10-CM | POA: Diagnosis not present

## 2018-10-29 DIAGNOSIS — L570 Actinic keratosis: Secondary | ICD-10-CM | POA: Diagnosis not present

## 2018-10-29 DIAGNOSIS — L550 Sunburn of first degree: Secondary | ICD-10-CM | POA: Diagnosis not present

## 2018-10-29 DIAGNOSIS — L821 Other seborrheic keratosis: Secondary | ICD-10-CM | POA: Diagnosis not present

## 2018-10-29 DIAGNOSIS — Z85828 Personal history of other malignant neoplasm of skin: Secondary | ICD-10-CM | POA: Diagnosis not present

## 2018-10-29 DIAGNOSIS — D044 Carcinoma in situ of skin of scalp and neck: Secondary | ICD-10-CM | POA: Diagnosis not present

## 2018-10-30 ENCOUNTER — Telehealth: Payer: Self-pay

## 2018-10-30 NOTE — Telephone Encounter (Signed)
I called the pt wife to try to trouble shoot the pt monitor. Unfortunately I did not get an answer and she has a voicemail that is not set up.

## 2018-10-31 NOTE — Telephone Encounter (Signed)
The pt son asked me to call Rosiland Oz tomorrow morning around 8:30-9 am.

## 2018-11-01 NOTE — Telephone Encounter (Signed)
I spoke with the pt wife and she states she is going to try to send the transmission with the pt home monitor. I gave her my direct office number to get help if needed.

## 2018-11-02 ENCOUNTER — Ambulatory Visit (INDEPENDENT_AMBULATORY_CARE_PROVIDER_SITE_OTHER): Payer: Medicare Other | Admitting: *Deleted

## 2018-11-02 DIAGNOSIS — I5022 Chronic systolic (congestive) heart failure: Secondary | ICD-10-CM

## 2018-11-02 DIAGNOSIS — I480 Paroxysmal atrial fibrillation: Secondary | ICD-10-CM

## 2018-11-02 NOTE — Telephone Encounter (Signed)
The pt tried to send the manual transmission however was unsuccessful. I gave him the number to [place] support to order a new monitor.

## 2018-11-05 NOTE — Telephone Encounter (Signed)
Transmission received 11-02-2018. I added him to the schedule in epic.

## 2018-11-09 DIAGNOSIS — Z8546 Personal history of malignant neoplasm of prostate: Secondary | ICD-10-CM | POA: Diagnosis not present

## 2018-11-09 DIAGNOSIS — R350 Frequency of micturition: Secondary | ICD-10-CM | POA: Diagnosis not present

## 2018-11-13 NOTE — Progress Notes (Signed)
Remote ICD transmission.   

## 2018-11-21 ENCOUNTER — Other Ambulatory Visit: Payer: Self-pay

## 2018-11-21 ENCOUNTER — Other Ambulatory Visit (INDEPENDENT_AMBULATORY_CARE_PROVIDER_SITE_OTHER): Payer: Medicare Other

## 2018-11-21 ENCOUNTER — Encounter: Payer: Self-pay | Admitting: Internal Medicine

## 2018-11-21 ENCOUNTER — Ambulatory Visit (INDEPENDENT_AMBULATORY_CARE_PROVIDER_SITE_OTHER): Payer: Medicare Other | Admitting: Internal Medicine

## 2018-11-21 VITALS — BP 120/70 | HR 70 | Temp 97.7°F | Ht 72.0 in | Wt 194.0 lb

## 2018-11-21 DIAGNOSIS — Z0000 Encounter for general adult medical examination without abnormal findings: Secondary | ICD-10-CM | POA: Diagnosis not present

## 2018-11-21 DIAGNOSIS — E785 Hyperlipidemia, unspecified: Secondary | ICD-10-CM

## 2018-11-21 DIAGNOSIS — E118 Type 2 diabetes mellitus with unspecified complications: Secondary | ICD-10-CM

## 2018-11-21 DIAGNOSIS — Z23 Encounter for immunization: Secondary | ICD-10-CM | POA: Diagnosis not present

## 2018-11-21 DIAGNOSIS — F3341 Major depressive disorder, recurrent, in partial remission: Secondary | ICD-10-CM | POA: Diagnosis not present

## 2018-11-21 DIAGNOSIS — I2510 Atherosclerotic heart disease of native coronary artery without angina pectoris: Secondary | ICD-10-CM | POA: Diagnosis not present

## 2018-11-21 LAB — LIPID PANEL
Cholesterol: 143 mg/dL (ref 0–200)
HDL: 38.1 mg/dL — ABNORMAL LOW (ref >39.00)
LDL Cholesterol: 65 mg/dL (ref 0–99)
NonHDL: 104.99
Total CHOL/HDL Ratio: 4
Triglycerides: 200 mg/dL — ABNORMAL HIGH (ref 0.0–149.0)
VLDL: 40 mg/dL (ref 0.0–40.0)

## 2018-11-21 MED ORDER — BUPROPION HCL ER (XL) 150 MG PO TB24: 150.0000 mg | ORAL_TABLET | Freq: Every day | ORAL | 1 refills | Status: DC

## 2018-11-21 NOTE — Progress Notes (Signed)
Subjective:  Patient ID: Corey [name], male    DOB: 11/07/1935  Age: 83 y.o. MRN: [id number]  CC: Hyperlipidemia, Diabetes, and Depression   HPI Corey [name] presents for f/up -he and his wife are happy to report that he continues to abstain from alcohol intake.  He continues to struggle with depression as evidenced by fatigue, anhedonia, and apathy.  She tells me she thinks he is taking the sertraline.  He denies SI or HI.  Outpatient Medications Prior to Visit  Medication Sig Dispense Refill   amiodarone (PACERONE) 200 MG tablet Take 0.5 tablets (100 mg total) by mouth daily. 45 tablet 3   apixaban (ELIQUIS) 2.5 MG TABS tablet Take 1 tablet (2.5 mg total) by mouth 2 (two) times daily. 60 tablet 11   Cholecalciferol (VITAMIN D3 PO) Take 4,000 Units by mouth daily.     folic acid (FOLVITE) 1 MG tablet TAKE ONE TABLET DAILY 90 tablet 1   levalbuterol (XOPENEX) 1.25 MG/3ML nebulizer solution Inhale into the lungs as needed.      levothyroxine (SYNTHROID, LEVOTHROID) 75 MCG tablet Take 1 tablet (75 mcg total) by mouth daily. 90 tablet 0   metFORMIN (GLUMETZA) 500 MG (MOD) 24 hr tablet Take 1,000 mg by mouth daily before breakfast.     metoprolol succinate (TOPROL-XL) 25 MG 24 hr tablet Take 1 tablet (25 mg total) by mouth daily. 90 tablet 3   mometasone (NASONEX) 50 MCG/ACT nasal spray 1-2 puffs each nostril once or twice daily if needed (Patient taking differently: as needed. 1-2 puffs each nostril once or twice daily if needed) 17 g 0   Multiple Vitamins-Minerals (CEROVITE PO) Take by mouth. Once daily with food     pantoprazole (PROTONIX) 40 MG tablet Take 1 tablet (40 mg total) by mouth daily. 90 tablet 0   rosuvastatin (CRESTOR) 5 MG tablet Take 1 tablet (5 mg total) by mouth at bedtime. 90 tablet 3   sertraline (ZOLOFT) 100 MG tablet Take 1.5 tablets (150 mg total) by mouth daily. 135 tablet 1   spironolactone (ALDACTONE) 25 MG tablet TAKE ONE TABLET DAILY 90  tablet 2   thiamine (VITAMIN B-1) 100 MG tablet TAKE ONE TABLET EACH DAY 90 tablet 1   No facility-administered medications prior to visit.     ROS Review of Systems  Constitutional: Positive for fatigue. Negative for diaphoresis and unexpected weight change.  HENT: Negative.   Eyes: Negative for visual disturbance.  Respiratory: Negative for cough, shortness of breath and wheezing.   Cardiovascular: Negative for chest pain, palpitations and leg swelling.  Gastrointestinal: Negative for abdominal pain, constipation, diarrhea and vomiting.  Endocrine: Negative.  Negative for polydipsia, polyphagia and polyuria.  Genitourinary: Negative.  Negative for difficulty urinating.  Musculoskeletal: Negative for arthralgias and myalgias.  Skin: Negative.  Negative for color change, pallor and rash.  Neurological: Negative.  Negative for dizziness, weakness, light-headedness and headaches.  Hematological: Negative for adenopathy. Does not bruise/bleed easily.  Psychiatric/Behavioral: Positive for confusion, decreased concentration and dysphoric mood. Negative for agitation, behavioral problems, sleep disturbance and suicidal ideas. The patient is not nervous/anxious.     Objective:  BP 120/70 (BP Location: Left Arm, Patient Position: Sitting, Cuff Size: Large)    Pulse 70    Temp 97.7 F (36.5 C) (Oral)    Ht 6' (1.829 m)    Wt 194 lb (88 kg)    SpO2 97%    BMI 26.31 kg/m   BP Readings from Last 3 Encounters:  11/21/18 120/70  09/27/18 119/75  09/18/18 122/64    Wt Readings from Last 3 Encounters:  11/21/18 194 lb (88 kg)  09/27/18 191 lb (86.6 kg)  09/18/18 189 lb (85.7 kg)    Physical Exam Vitals signs reviewed.  HENT:     Nose: Nose normal.     Mouth/Throat:     Mouth: Mucous membranes are moist.  Eyes:     General: No scleral icterus.    Conjunctiva/sclera: Conjunctivae normal.  Neck:     Musculoskeletal: Neck supple.  Cardiovascular:     Rate and Rhythm: Normal rate and  regular rhythm.     Heart sounds: No murmur.  Pulmonary:     Effort: Pulmonary effort is normal.     Breath sounds: No stridor. No wheezing, rhonchi or rales.  Abdominal:     General: Abdomen is protuberant. Bowel sounds are normal. There is no distension.     Palpations: Abdomen is soft. There is no hepatomegaly or splenomegaly.     Tenderness: There is no abdominal tenderness.  Musculoskeletal: Normal range of motion.     Right lower leg: No edema.     Left lower leg: No edema.  Lymphadenopathy:     Cervical: No cervical adenopathy.  Skin:    General: Skin is warm and dry.  Neurological:     General: No focal deficit present.     Mental Status: He is oriented to person, place, and time. Mental status is at baseline.  Psychiatric:        Attention and Perception: Attention and perception normal.        Mood and Affect: Mood is depressed. Mood is not anxious or elated. Affect is flat. Affect is not angry.        Speech: Speech normal.        Behavior: Behavior normal. Behavior is cooperative.        Thought Content: Thought content normal. Thought content is not paranoid or delusional. Thought content does not include homicidal or suicidal ideation.        Cognition and Memory: Cognition is impaired. Memory is impaired. He exhibits impaired recent memory and impaired remote memory.     Lab Results  Component Value Date   WBC 5.4 09/18/2018   HGB 13.3 09/18/2018   HCT 39.7 09/18/2018   PLT 160 09/18/2018   GLUCOSE 116 (H) 09/18/2018   CHOL 143 11/21/2018   TRIG 200.0 (H) 11/21/2018   HDL 38.10 (L) 11/21/2018   LDLCALC 65 11/21/2018   ALT 25 09/18/2018   AST 36 09/18/2018   NA 139 09/18/2018   K 4.8 09/18/2018   CL 100 09/18/2018   CREATININE 1.54 (H) 09/18/2018   BUN 24 09/18/2018   CO2 22 09/18/2018   TSH 1.030 09/18/2018   PSA <0.015 05/26/2016   INR 1.4 (H) 06/04/2018   HGBA1C 6.6 (H) 11/21/2018   MICROALBUR 9.4 (H) 03/21/2018    Ct Head Wo Contrast  Result  Date: 03/19/2018 CLINICAL DATA:  83 year old male with facial trauma. EXAM: CT HEAD WITHOUT CONTRAST CT MAXILLOFACIAL WITHOUT CONTRAST CT CERVICAL SPINE WITHOUT CONTRAST TECHNIQUE: Multidetector CT imaging of the head, cervical spine, and maxillofacial structures were performed using the standard protocol without intravenous contrast. Multiplanar CT image reconstructions of the cervical spine and maxillofacial structures were also generated. COMPARISON:  CT of the head dated 04/21/2017 FINDINGS: CT HEAD FINDINGS Brain: There is mild age-related atrophy and chronic microvascular ischemic changes. Focal left portal old infarct and encephalomalacia along  the sylvian fissure. There is no acute intracranial hemorrhage. No mass effect or midline shift. No extra-axial fluid collection. Vascular: No hyperdense vessel or unexpected calcification. Skull: Normal. Negative for fracture or focal lesion. Other: Minimal right forehead contusion. CT MAXILLOFACIAL FINDINGS Osseous: No acute fracture. No mandibular subluxation. Orbits: The globes and retro-orbital fat are preserved. Sinuses: Diffuse mucoperiosteal thickening of the right maxillary sinus and ethmoid air cells. No air-fluid level. The mastoid air cells are clear. Soft tissues: Right periorbital hematoma. CT CERVICAL SPINE FINDINGS Alignment: No acute subluxation. Skull base and vertebrae: No acute fracture. No primary bone lesion or focal pathologic process. Soft tissues and spinal canal: No prevertebral fluid or swelling. No visible canal hematoma. Disc levels: Degenerative changes most prominent at C5-C6 and C6-C7. Upper chest: Negative. Other: None IMPRESSION: 1. No acute intracranial hemorrhage. 2. No acute facial bone fractures. 3. Right periorbital hematoma. 4. No acute/traumatic cervical spine pathology. Electronically Signed   By: Anner Crete M.D.   On: 03/19/2018 01:44   Ct Cervical Spine Wo Contrast  Result Date: 03/19/2018 CLINICAL DATA:   83 year old male with facial trauma. EXAM: CT HEAD WITHOUT CONTRAST CT MAXILLOFACIAL WITHOUT CONTRAST CT CERVICAL SPINE WITHOUT CONTRAST TECHNIQUE: Multidetector CT imaging of the head, cervical spine, and maxillofacial structures were performed using the standard protocol without intravenous contrast. Multiplanar CT image reconstructions of the cervical spine and maxillofacial structures were also generated. COMPARISON:  CT of the head dated 04/21/2017 FINDINGS: CT HEAD FINDINGS Brain: There is mild age-related atrophy and chronic microvascular ischemic changes. 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 There is no acute intracranial hemorrhage. No mass effect or midline shift. No extra-axial fluid collection. Vascular: No hyperdense vessel or unexpected calcification. Skull: Normal. Negative for fracture or focal lesion. Other: Minimal right forehead contusion. CT MAXILLOFACIAL FINDINGS Osseous: No acute fracture. No mandibular subluxation. Orbits: The globes and retro-orbital fat are preserved. Sinuses: Diffuse mucoperiosteal thickening of the right maxillary sinus and ethmoid air cells. No air-fluid level. The mastoid air cells are clear. Soft tissues: Right periorbital hematoma. CT CERVICAL SPINE FINDINGS Alignment: No acute subluxation. Skull base and vertebrae: No acute fracture. No primary bone lesion or focal pathologic process. Soft tissues and spinal canal: No prevertebral fluid or swelling. No visible canal hematoma. Disc levels: Degenerative changes most prominent at C5-C6 and C6-C7. Upper chest: Negative. Other: None IMPRESSION: 1. No acute intracranial hemorrhage. 2. No acute facial bone fractures. 3. Right periorbital hematoma. 4. No acute/traumatic cervical spine pathology. Electronically Signed   By: Anner Crete M.D.   On: 03/19/2018 01:44   Ct Maxillofacial Wo Contrast  Result Date: 03/19/2018 CLINICAL DATA:  83 year old male with facial trauma. EXAM:  CT HEAD WITHOUT CONTRAST CT MAXILLOFACIAL WITHOUT CONTRAST CT CERVICAL SPINE WITHOUT CONTRAST TECHNIQUE: Multidetector CT imaging of the head, cervical spine, and maxillofacial structures were performed using the standard protocol without intravenous contrast. Multiplanar CT image reconstructions of the cervical spine and maxillofacial structures were also generated. COMPARISON:  CT of the head dated 04/21/2017 FINDINGS: CT HEAD FINDINGS Brain: There is mild age-related atrophy and chronic microvascular ischemic changes. 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 There is no acute intracranial hemorrhage. No mass effect or midline shift. No extra-axial fluid collection. Vascular: No hyperdense vessel or unexpected calcification. Skull: Normal. Negative for fracture or focal lesion. Other: Minimal right forehead contusion. CT MAXILLOFACIAL FINDINGS Osseous: No acute fracture. No mandibular subluxation. Orbits: The globes and retro-orbital fat are preserved.  Sinuses: Diffuse mucoperiosteal thickening of the right maxillary sinus and ethmoid air cells. No air-fluid level. The mastoid air cells are clear. Soft tissues: Right periorbital hematoma. CT CERVICAL SPINE FINDINGS Alignment: No acute subluxation. Skull base and vertebrae: No acute fracture. No primary bone lesion or focal pathologic process. Soft tissues and spinal canal: No prevertebral fluid or swelling. No visible canal hematoma. Disc levels: Degenerative changes most prominent at C5-C6 and C6-C7. Upper chest: Negative. Other: None IMPRESSION: 1. No acute intracranial hemorrhage. 2. No acute facial bone fractures. 3. Right periorbital hematoma. 4. No acute/traumatic cervical spine pathology. Electronically Signed   By: Anner Crete M.D.   On: 03/19/2018 01:44    Assessment & Plan:   Corey [name] was seen today for hyperlipidemia, diabetes and depression.  Diagnoses and all orders for this visit:  Need for influenza  vaccination -     Flu Vaccine QUAD High Dose(Fluad)  Type 2 diabetes mellitus with complication, without long-term current use of insulin ([place])- His A1c is at 6.6%.  His blood sugar is adequately well controlled. -     Hemoglobin A1c; Future  Recurrent major depressive disorder, in partial remission ([place])- I recommended he add bupropion to the sertraline to see if he would benefit from dopamine support. -     buPROPion (WELLBUTRIN XL) 150 MG 24 hr tablet; Take 1 tablet (150 mg total) by mouth daily.  Hyperlipidemia with target LDL less than 100- He has achieved his LDL goal and is doing well on the statin. -     Lipid panel; Future   I am having Corey [name] start on buPROPion. I am also having him maintain his levalbuterol, mometasone, thiamine, sertraline, levothyroxine, pantoprazole, spironolactone, metFORMIN, Multiple Vitamins-Minerals (CEROVITE PO), Cholecalciferol (VITAMIN D3 PO), amiodarone, metoprolol succinate, rosuvastatin, apixaban, and folic acid.  Meds ordered this encounter  Medications   buPROPion (WELLBUTRIN XL) 150 MG 24 hr tablet    Sig: Take 1 tablet (150 mg total) by mouth daily.    Dispense:  90 tablet    Refill:  1     Follow-up: Return in about 6 months (around 05/22/2019).  Scarlette Calico, MD

## 2018-11-21 NOTE — Patient Instructions (Signed)

## 2018-11-22 DIAGNOSIS — H0012 Chalazion right lower eyelid: Secondary | ICD-10-CM | POA: Diagnosis not present

## 2018-11-22 LAB — HEMOGLOBIN A1C: Hgb A1c MFr Bld: 6.6 % — ABNORMAL HIGH (ref 4.6–6.5)

## 2018-11-22 NOTE — Assessment & Plan Note (Signed)
Exam completed Labs reviewed No cancer screenings are indicated Vaccines reviewed and updated Patient education was given

## 2018-11-28 ENCOUNTER — Other Ambulatory Visit: Payer: Self-pay | Admitting: Internal Medicine

## 2018-12-12 ENCOUNTER — Ambulatory Visit (INDEPENDENT_AMBULATORY_CARE_PROVIDER_SITE_OTHER): Payer: Medicare Other | Admitting: *Deleted

## 2018-12-12 ENCOUNTER — Telehealth (INDEPENDENT_AMBULATORY_CARE_PROVIDER_SITE_OTHER): Payer: Medicare Other | Admitting: Internal Medicine

## 2018-12-12 ENCOUNTER — Encounter: Payer: Self-pay | Admitting: Internal Medicine

## 2018-12-12 ENCOUNTER — Other Ambulatory Visit: Payer: Self-pay

## 2018-12-12 VITALS — Ht 72.0 in | Wt 188.0 lb

## 2018-12-12 DIAGNOSIS — I5022 Chronic systolic (congestive) heart failure: Secondary | ICD-10-CM

## 2018-12-12 DIAGNOSIS — I480 Paroxysmal atrial fibrillation: Secondary | ICD-10-CM | POA: Diagnosis not present

## 2018-12-12 DIAGNOSIS — I2510 Atherosclerotic heart disease of native coronary artery without angina pectoris: Secondary | ICD-10-CM | POA: Diagnosis not present

## 2018-12-12 DIAGNOSIS — I428 Other cardiomyopathies: Secondary | ICD-10-CM | POA: Diagnosis not present

## 2018-12-12 NOTE — Progress Notes (Signed)
Electrophysiology TeleHealth Note  Due to national recommendations of social distancing due to [place] 19, an audio telehealth visit is felt to be most appropriate for this patient at this time.  Verbal consent was obtained by me for the telehealth visit today.  The patient does not have capability for a virtual visit.  A phone visit is therefore required today.   Date:  12/12/2018   ID:  Corey [name], DOB April 05, 1935, MRN [id number]  Location: patient's home  Provider location:  [facility]  Evaluation Performed: Follow-up visit  PCP:  Janith Lima, MD   Electrophysiologist:  Dr Rayann Heman  Chief Complaint:  CHF  History of Present Illness:    Corey [name] is a 83 y.o. male who presents via telehealth conferencing today.  Since last being seen in our clinic, the patient reports doing very well.  He sold his house in [place] and is moving into [place].   He seems to be doing well at this time. Today, he denies symptoms of palpitations, chest pain, shortness of breath,  lower extremity edema, dizziness, presyncope, or syncope.  His primary concern is with back pain.  He continues to struggle with alcoholism.  The patient is otherwise without complaint today.  The patient denies symptoms of fevers, chills, cough, or new SOB worrisome for COVID 19.  Past Medical History:  Diagnosis Date  . Alcohol abuse, episodic 05/19/2008  . ALLERGIC RHINITIS 04/15/2009  . Allergy    YEAR ROUND,TAKE SHOTS  . Alzheimer's dementia ([place])    beginning with sx  . Anxiety   . Arthritis   . ASTHMA 11/16/2006   sinusitis-    hx ?yeast patch/white patch on vocal cord as per [facility] 02/25/11-   . Atrial fibrillation ([place]) 11/16/2006   remote CHF related to atrial fib with rapid ventricular response over 25 yrs per office note,  . [facility]) 2002   prostate cancer  . Chronic systolic CHF (congestive heart failure) (HCC)    echo (10/02/12): EF 123456, grade 1 diastolic dysfunction, trivial AI,  mild MR, mild RVE  . COLONIC POLYPS, ADENOMATOUS, HX OF 02/14/2008  . CORONARY ARTERY DISEASE 11/16/2006   LHC (07/2011): LAD with luminal irregularities, proximal circumflex 50%, EF 25%  . Depression   . DIVERTICULOSIS, COLON 02/14/2008  . ESOPHAGEAL STRICTURE 01/28/2008  . GERD 02/14/2008  . Headache(784.0)   . HYPERGLYCEMIA 11/16/2006  . HYPERLIPIDEMIA 02/14/2008  . HYPERTENSION 11/16/2006  . ICD (implantable cardiac defibrillator) in place   . INSOMNIA-SLEEP DISORDER-UNSPEC 02/11/2009  . Lesion of vocal cord    bx begine  . Myocardial infarction ([place]) 1993  . NICM (nonischemic cardiomyopathy) ([place])   . OBSTRUCTIVE SLEEP APNEA 11/10/2008  . 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 Pacemaker    St. Jude  . Pneumonia    hx of  . PROSTATE CANCER, HX OF 02/25/2000  . SLEEP APNEA 10/03/2009   LOV Dr [name] 12/12 in EPIC    Moderate per patient- settings ?6/last sleep study years ago  . Sleep apnea   . Stroke ([place])    Tia  . Symptomatic bradycardia, secondary to sinus node dysfunction 07/08/2011   s/p [facility] Scientific PPM by Dr Sallyanne Kuster, upgraded to [place] ICD 12/13 by Dr Rayann Heman (SJM)  . TRANSIENT ISCHEMIC ATTACKS, HX OF 11/16/2006  . Type 2 diabetes mellitus with complication, without long-term current use of insulin ([place]) 11/19/2016    Past Surgical History:  Procedure Laterality Date  . BACK SURGERY    .  BI-VENTRICULAR IMPLANTABLE CARDIOVERTER DEFIBRILLATOR UPGRADE N/A 01/16/2012   Procedure: BI-VENTRICULAR IMPLANTABLE CARDIOVERTER DEFIBRILLATOR UPGRADE;  Surgeon: Thompson Grayer, MD;  Location[facility] C[facility] LAB;  Service: Cardiovascular;  Laterality: N/A;  . CARDIAC CATHETERIZATION     several  . CARDIAC DEFIBRILLATOR PLACEMENT  01/16/12   Upgrade to a biventricular SJM ICD by DR Lus Kriegel  . COLONOSCOPY    . ESOPHAGOGASTRODUODENOSCOPY     with dilitation  . INSERT / REPLACE / REMOVE PACEMAKER    . KNEE ARTHROSCOPY     2 surgeries right and 1 left  . LEFT AND RIGHT HEART CATHETERIZATION  WITH CORONARY ANGIOGRAM N/A 08/18/2011   Procedure: LEFT AND RIGHT HEART CATHETERIZATION WITH CORONARY ANGIOGRAM;  Surgeon: [name], MD;  Location: [facility] CATH LAB;  Service: Cardiovascular;  Laterality: N/A;  . LUMBAR LAMINECTOMY/DECOMPRESSION MICRODISCECTOMY  03/02/2011   Procedure: LUMBAR LAMINECTOMY/DECOMPRESSION MICRODISCECTOMY;  Surgeon: [name], MD;  Location: [facility] ORS;  Service: Orthopedics;  Laterality: N/A;  Decompression Lumbar 4 - Lumbar(X-Ray)  . PACEMAKER INSERTION  06/2011   [employer] PPM implanted by Dr Sallyanne Kuster  . PERMANENT PACEMAKER INSERTION Left 07/08/2011   Procedure: PERMANENT PACEMAKER INSERTION;  Surgeon: [name], MD;  Location: [facility] CATH LAB;  Service: Cardiovascular;  Laterality: Left;  . PROSTATE SURGERY  2/02   prostate cancer  . TONSILECTOMY, ADENOIDECTOMY, BILATERAL MYRINGOTOMY AND TUBES    . TONSILLECTOMY  as child  . TOTAL KNEE ARTHROPLASTY Left 06/04/2012   Procedure: TOTAL KNEE ARTHROPLASTY;  Surgeon: [name], MD;  Location: [facility] ORS;  Service: Orthopedics;  Laterality: Left;  . UVULOPALATOPHARYNGOPLASTY  2013  . vocal cord lesion bx      Current Outpatient Medications  Medication Sig Dispense Refill  . amiodarone (PACERONE) 200 MG tablet Take 0.5 tablets (100 mg total) by mouth daily. 45 tablet 3  . apixaban (ELIQUIS) 2.5 MG TABS tablet Take 1 tablet (2.5 mg total) by mouth 2 (two) times daily. 60 tablet 11  . buPROPion (WELLBUTRIN XL) 150 MG 24 hr tablet Take 1 tablet (150 mg total) by mouth daily. 90 tablet 1  . Cholecalciferol (VITAMIN D3 PO) Take 4,000 Units by mouth daily.    . folic acid (FOLVITE) 1 MG tablet TAKE ONE TABLET DAILY 90 tablet 1  . levalbuterol (XOPENEX) 1.25 MG/3ML nebulizer solution Inhale into the lungs as needed.     Marland Kitchen levothyroxine (SYNTHROID) 125 MCG tablet TAKE ONE TABLET DAILY 90 tablet 1  . levothyroxine (SYNTHROID, LEVOTHROID) 75 MCG tablet Take 1 tablet (75 mcg total) by mouth daily. 90 tablet 0  .  metFORMIN (GLUMETZA) 500 MG (MOD) 24 hr tablet Take 1,000 mg by mouth daily before breakfast.    . metoprolol succinate (TOPROL-XL) 25 MG 24 hr tablet Take 1 tablet (25 mg total) by mouth daily. 90 tablet 3  . mometasone (NASONEX) 50 MCG/ACT nasal spray 1-2 puffs each nostril once or twice daily if needed (Patient taking differently: as needed. 1-2 puffs each nostril once or twice daily if needed) 17 g 0  . Multiple Vitamins-Minerals (CEROVITE PO) Take by mouth. Once daily with food    . pantoprazole (PROTONIX) 40 MG tablet TAKE ONE TABLET DAILY 90 tablet 1  . rosuvastatin (CRESTOR) 5 MG tablet Take 1 tablet (5 mg total) by mouth at bedtime. 90 tablet 3  . sertraline (ZOLOFT) 100 MG tablet Take 1.5 tablets (150 mg total) by mouth daily. 135 tablet 1  . spironolactone (ALDACTONE) 25 MG tablet TAKE ONE TABLET DAILY 90 tablet 2  .  thiamine (VITAMIN B-1) 100 MG tablet TAKE ONE TABLET EACH DAY 90 tablet 1   No current facility-administered medications for this visit.     Allergies:   Altace [ramipril], Hydrocodone, and Oxycodone   Social History:  The patient  reports that he quit smoking about 54 years ago. His smoking use included cigarettes. He has a 4.00 pack-year smoking history. He has never used smokeless tobacco. He reports previous alcohol use. He reports that he does not use drugs.   Family History:  The patient's  family history includes Autism in his son; Heart attack in his brother, father, and maternal uncle; Heart disease in his maternal uncle; Sudden death (age of onset: 86) in his daughter.   ROS:  Please see the history of present illness.   All other systems are personally reviewed and negative.    Exam:    Vital Signs:  Ht 6' (1.829 m)   Wt 188 lb (85.3 kg)   BMI 25.50 kg/m   Well sounding, alert and conversant   Labs/Other Tests and Data Reviewed:    Recent Labs: 09/18/2018: ALT 25; BUN 24; Creatinine, Ser 1.54; Hemoglobin 13.3; Platelets 160; Potassium 4.8; Sodium  139; TSH 1.030   Wt Readings from Last 3 Encounters:  12/12/18 188 lb (85.3 kg)  11/21/18 194 lb (88 kg)  09/27/18 191 lb (86.6 kg)      ASSESSMENT & PLAN:    1.  Chronic systolic dysfunction/ nonischemic cardiomyopathy. He has not been compliant with remotes.  I have asked for a manual transmission today.  The importance of compliance with remotes as his battery function needs close monitoring over the next year was discussed today.  2. HTN Stable No change required today  3. afib Well controlled previously with amiodarone 100mg  daily Will have him send a manual trasmission today (as above) to further evaluate afib burden Labs from 08/2018 are reviewed Consider reducing amiodarone to 100mg  QMWF if no further afib on return  4. ETOH He continues to struggle with this but states "I havent had a drink in 6 months"  Follow-up:  Continue remotes As he has moved to [place], I have advised that he establish care in [place] at the next available time.  We will follow remotely until then   Patient Risk:  after full review of this patients clinical status, I feel that they are at moderate risk at this time.  Today, I have spent 15 minutes with the patient with telehealth technology discussing arrhythmia management .    Army Fossa, MD  12/12/2018 11:56 AM     [facility] [address] [place] [address] [phone] (office) [phone] (fax)

## 2018-12-17 LAB — CUP PACEART REMOTE DEVICE CHECK
Battery Remaining Longevity: 11 mo
Battery Remaining Percentage: 13 %
Battery Voltage: 2.71 V
Brady Statistic AP VP Percent: 87 %
Brady Statistic AP VS Percent: 2.7 %
Brady Statistic AS VP Percent: 6.9 %
Brady Statistic AS VS Percent: 1.4 %
Brady Statistic RA Percent Paced: 88 %
Date Time Interrogation Session: 20201118120151
HighPow Impedance: 75 Ohm
HighPow Impedance: 75 Ohm
Implantable Lead Implant Date: 20130614
Implantable Lead Implant Date: 20131223
Implantable Lead Implant Date: 20131223
Implantable Lead Location: 753858
Implantable Lead Location: 753859
Implantable Lead Location: 753860
Implantable Lead Model: 4136
Implantable Lead Model: 7122
Implantable Lead Serial Number: 29201222
Implantable Pulse Generator Implant Date: 20131223
Lead Channel Impedance Value: 1000 Ohm
Lead Channel Impedance Value: 430 Ohm
Lead Channel Impedance Value: 530 Ohm
Lead Channel Pacing Threshold Amplitude: 0.75 V
Lead Channel Pacing Threshold Amplitude: 0.75 V
Lead Channel Pacing Threshold Amplitude: 1 V
Lead Channel Pacing Threshold Pulse Width: 0.5 ms
Lead Channel Pacing Threshold Pulse Width: 0.5 ms
Lead Channel Pacing Threshold Pulse Width: 0.7 ms
Lead Channel Sensing Intrinsic Amplitude: 1.2 mV
Lead Channel Sensing Intrinsic Amplitude: 11.5 mV
Lead Channel Setting Pacing Amplitude: 2 V
Lead Channel Setting Pacing Amplitude: 2 V
Lead Channel Setting Pacing Amplitude: 2.5 V
Lead Channel Setting Pacing Pulse Width: 0.5 ms
Lead Channel Setting Pacing Pulse Width: 0.7 ms
Lead Channel Setting Sensing Sensitivity: 0.5 mV
Pulse Gen Serial Number: 7057550

## 2018-12-24 ENCOUNTER — Telehealth: Payer: Medicare Other | Admitting: Internal Medicine

## 2019-01-09 NOTE — Progress Notes (Signed)
Remote ICD transmission.   

## 2019-01-09 NOTE — Addendum Note (Signed)
Addended by: Douglass Rivers D on: 01/09/2019 03:42 PM   Modules accepted: Level of Service

## 2019-02-01 ENCOUNTER — Telehealth: Payer: Self-pay

## 2019-02-01 NOTE — Telephone Encounter (Signed)
Unable to speak  with patient to remind of missed remote transmission 

## 2019-02-05 ENCOUNTER — Telehealth: Payer: Self-pay | Admitting: Internal Medicine

## 2019-02-05 NOTE — Telephone Encounter (Signed)
Patient requesting callback at [phone]

## 2019-02-05 NOTE — Telephone Encounter (Signed)
New message    Patient calling to report his cpap machine is not working properly, stating machine delays when cutting on then blows blast of air , then cuts off. Patient is currently in [place] for at least 2 more months.  Best number to contact patient [phone]  Please advise

## 2019-02-05 NOTE — Telephone Encounter (Signed)
Left detailed message for pt requesting who dispensed his current CPAP. If pt does not remember who he got it from then he may have to do another sleep study to get a new one.

## 2019-02-21 ENCOUNTER — Other Ambulatory Visit: Payer: Self-pay | Admitting: Internal Medicine

## 2019-02-26 ENCOUNTER — Telehealth: Payer: Self-pay | Admitting: Internal Medicine

## 2019-02-26 NOTE — Telephone Encounter (Signed)
New message   Pt wife has questions about his battery life. Please call.

## 2019-02-26 NOTE — Telephone Encounter (Signed)
Spoke with pt spouse.  Advised estimated battery longevity was 39months as of 12/12/18 transmission.  She reports that they have moved out of [place].  They are temporarily in [place] but will be moving permenantly to [facility] in August.  Advised battery will likely be reaching ERI at that time.  She will locate and MD to establish care prior to that occurring.

## 2019-03-01 ENCOUNTER — Telehealth: Payer: Self-pay | Admitting: Internal Medicine

## 2019-03-01 NOTE — Telephone Encounter (Signed)
Left message for Corey [name] to call back.

## 2019-03-01 NOTE — Telephone Encounter (Signed)
Pt's wife is calling to check in on the status of the CPAP [facility] in [place], [place]: [phone] -   They are stating that they need to drive an hour to get the machine - please advise at [phone]

## 2019-03-01 NOTE — Telephone Encounter (Signed)
[name] calling in CPAP machine broke last night and was wanting to get a prescription so that may get it fixed and to temporarily get a loaner one. She stated she has talked to a nurse. [phone]

## 2019-03-04 NOTE — Telephone Encounter (Signed)
LMTCB x2 for pt's wife.  

## 2019-03-05 NOTE — Telephone Encounter (Signed)
I called the number provided and a message comes on stating that it is not in service.

## 2019-03-06 ENCOUNTER — Other Ambulatory Visit: Payer: Self-pay | Admitting: Internal Medicine

## 2019-03-06 DIAGNOSIS — F3341 Major depressive disorder, recurrent, in partial remission: Secondary | ICD-10-CM

## 2019-03-06 MED ORDER — BUPROPION HCL ER (XL) 150 MG PO TB24: 150.0000 mg | ORAL_TABLET | Freq: Every day | ORAL | 1 refills | Status: AC

## 2019-03-06 NOTE — Telephone Encounter (Signed)
ATC the pt's home number, it is no longer in service. ATC the pt's wife's cell phone number, there was no answer and I could leave a message due to her voicemail being full. We have attempted to contact pt's wife several times with no success or call back from her. Per triage protocol, message will be closed.

## 2019-03-07 ENCOUNTER — Telehealth: Payer: Self-pay | Admitting: Internal Medicine

## 2019-03-07 DIAGNOSIS — G4733 Obstructive sleep apnea (adult) (pediatric): Secondary | ICD-10-CM | POA: Diagnosis not present

## 2019-03-07 NOTE — Telephone Encounter (Signed)
Corey [name] is requesting the sleep test.

## 2019-03-07 NOTE — Telephone Encounter (Signed)
Pt has not been seen at office since 2017. I tried to locate pt's sleep study results in pt's chart and I was not able to locate this.  [facility] and spoke with Amy stating the above info to her. Amy verbalized understanding. Nothing further needed.

## 2019-03-11 ENCOUNTER — Other Ambulatory Visit: Payer: Self-pay | Admitting: Internal Medicine

## 2019-03-11 DIAGNOSIS — E032 Hypothyroidism due to medicaments and other exogenous substances: Secondary | ICD-10-CM

## 2019-03-11 MED ORDER — LEVOTHYROXINE SODIUM 125 MCG PO TABS: 125.0000 ug | ORAL_TABLET | Freq: Every day | ORAL | 0 refills | Status: AC

## 2019-03-12 ENCOUNTER — Telehealth: Payer: Self-pay | Admitting: Internal Medicine

## 2019-03-12 DIAGNOSIS — I110 Hypertensive heart disease with heart failure: Secondary | ICD-10-CM | POA: Diagnosis not present

## 2019-03-12 DIAGNOSIS — G4733 Obstructive sleep apnea (adult) (pediatric): Secondary | ICD-10-CM | POA: Diagnosis not present

## 2019-03-12 DIAGNOSIS — I4891 Unspecified atrial fibrillation: Secondary | ICD-10-CM | POA: Diagnosis not present

## 2019-03-12 NOTE — Telephone Encounter (Signed)
Called and spoke to [facility] with [facility]. She is requesting to know what DME company pt has used in the past. Advised her pt has used [employer]. Becky verbalized understanding and denied any further questions or concerns at this time.

## 2019-03-13 DIAGNOSIS — I110 Hypertensive heart disease with heart failure: Secondary | ICD-10-CM | POA: Diagnosis not present

## 2019-03-13 DIAGNOSIS — I4891 Unspecified atrial fibrillation: Secondary | ICD-10-CM | POA: Diagnosis not present

## 2019-03-13 DIAGNOSIS — G4733 Obstructive sleep apnea (adult) (pediatric): Secondary | ICD-10-CM | POA: Diagnosis not present

## 2019-03-14 DIAGNOSIS — I4891 Unspecified atrial fibrillation: Secondary | ICD-10-CM | POA: Diagnosis not present

## 2019-03-14 DIAGNOSIS — G4733 Obstructive sleep apnea (adult) (pediatric): Secondary | ICD-10-CM | POA: Diagnosis not present

## 2019-03-14 DIAGNOSIS — I110 Hypertensive heart disease with heart failure: Secondary | ICD-10-CM | POA: Diagnosis not present

## 2019-04-14 DIAGNOSIS — Z23 Encounter for immunization: Secondary | ICD-10-CM | POA: Diagnosis not present

## 2019-04-16 ENCOUNTER — Other Ambulatory Visit: Payer: Self-pay | Admitting: Internal Medicine

## 2019-04-16 ENCOUNTER — Ambulatory Visit: Payer: Medicare Other | Admitting: Internal Medicine

## 2019-04-16 DIAGNOSIS — I2510 Atherosclerotic heart disease of native coronary artery without angina pectoris: Secondary | ICD-10-CM

## 2019-04-16 MED ORDER — FOLIC ACID 1 MG PO TABS: 1.0000 mg | ORAL_TABLET | Freq: Every day | ORAL | 1 refills | Status: AC

## 2019-04-29 DIAGNOSIS — N1832 Chronic kidney disease, stage 3b: Secondary | ICD-10-CM | POA: Diagnosis not present

## 2019-06-12 DIAGNOSIS — I2510 Atherosclerotic heart disease of native coronary artery without angina pectoris: Secondary | ICD-10-CM | POA: Diagnosis not present

## 2019-06-21 IMAGING — CT CT HEAD W/O CM
4 series · 16 of 47 positions shown, 18 images · non-contrast
Comparison: 06/03/2016

CLINICAL DATA: Fall at physical therapy today. Hit left side of
head. Initial encounter.

EXAM:
CT HEAD WITHOUT CONTRAST
TECHNIQUE: Contiguous axial images were obtained from the base of the skull
through the vertex without intravenous contrast.

[Series 3: head wo · axial · 0.43mm/px · z∈[-34,+86]mm · 7 of 33 slices shown, 9 images]
[im 5/33  brain]
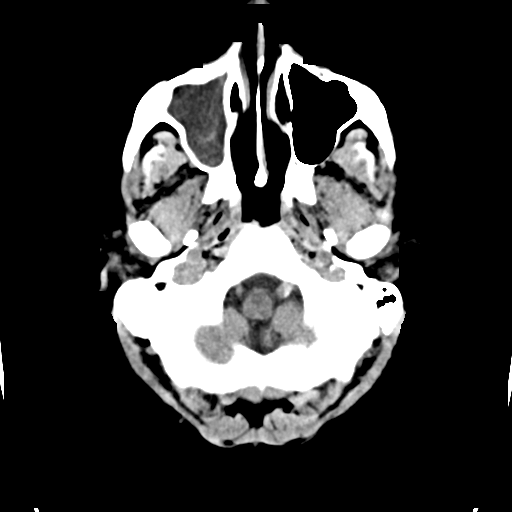
[im 5/33  bone]
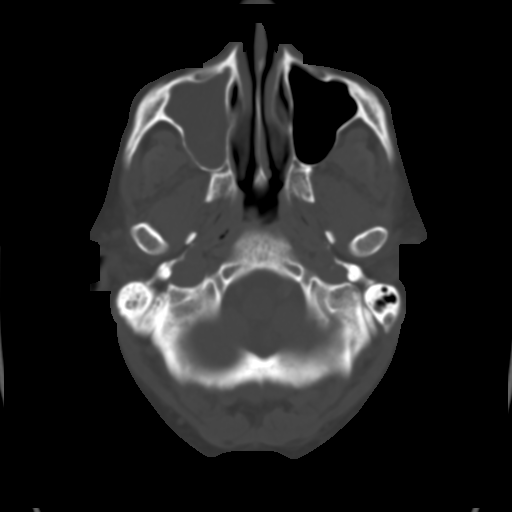
[im 9/33  brain]
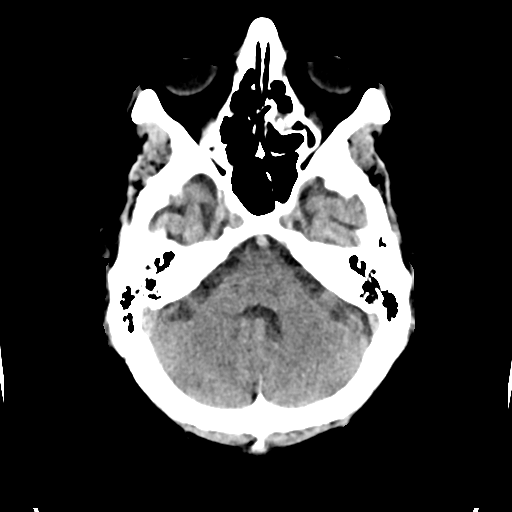
[im 13/33  brain]
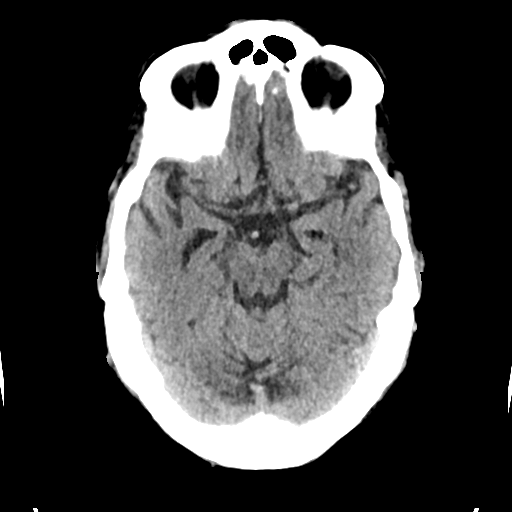
[im 17/33  brain]
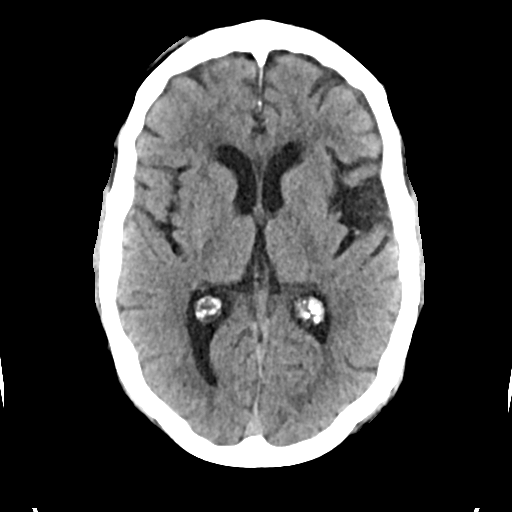
[im 21/33  brain]
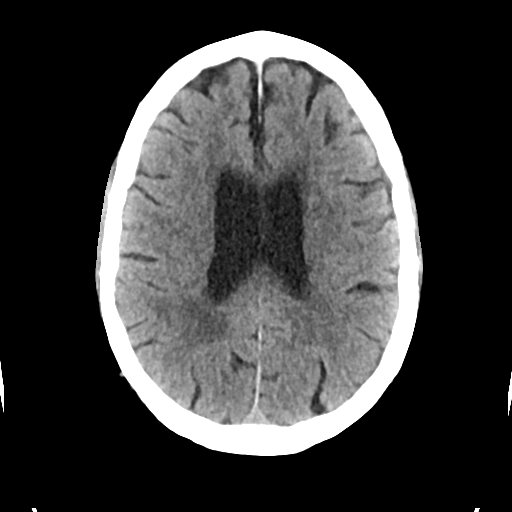
[im 21/33  bone]
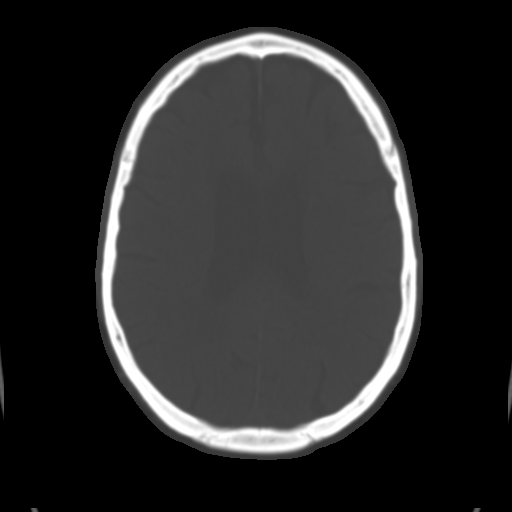
[im 25/33  brain]
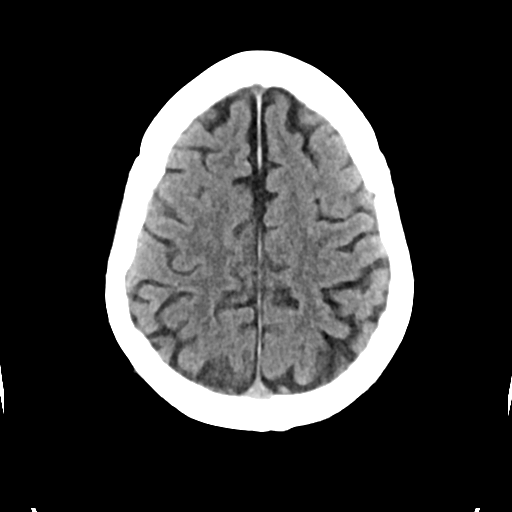
[im 29/33  brain]
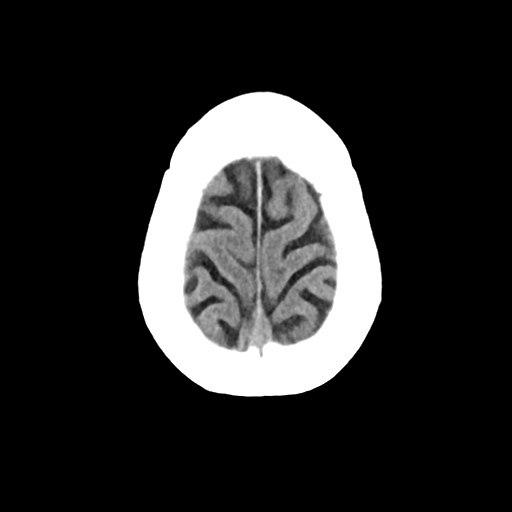

[Series 4: head bone · axial · 0.43mm/px · z∈[-38,-6]mm · 3 of 82 slices shown]
[im 9/82  bone]
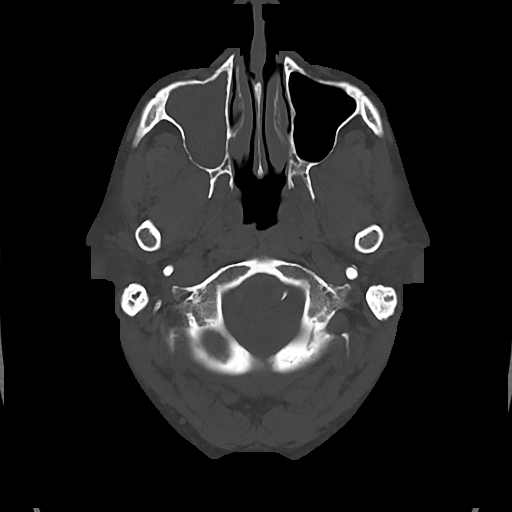
[im 17/82  bone]
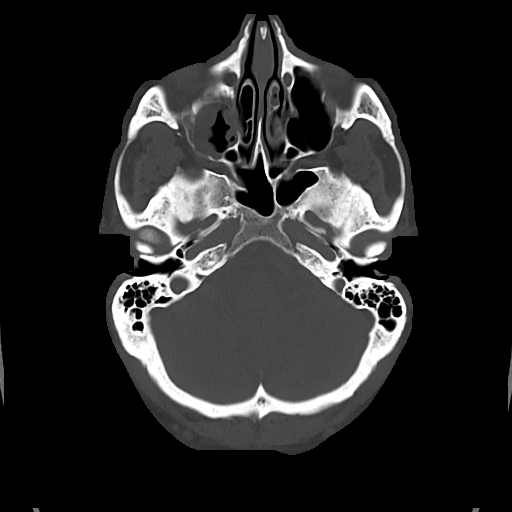
[im 25/82  bone]
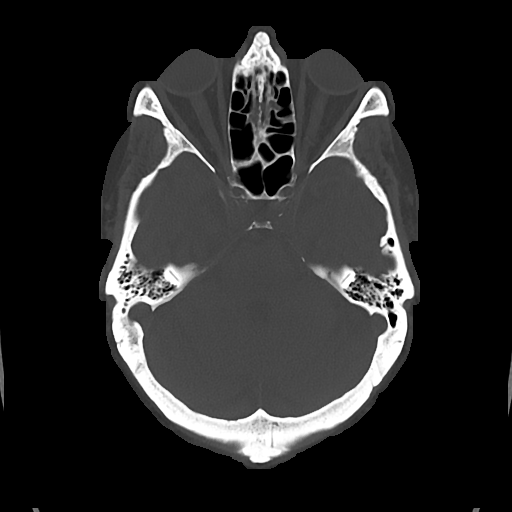

[Series 5: cor soft · coronal · 0.31mm/px · 3 of 67 slices shown]
[im 23/67  brain]
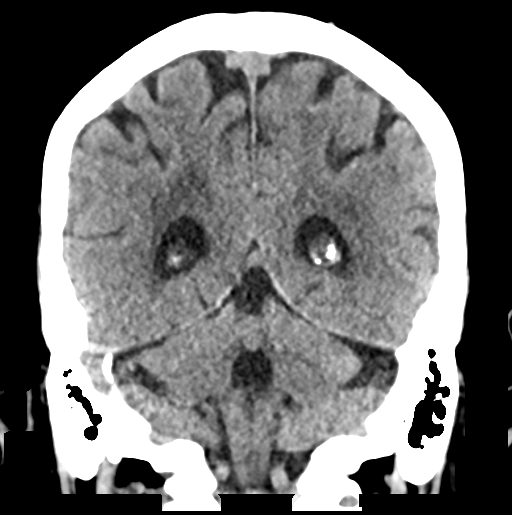
[im 30/67  brain]
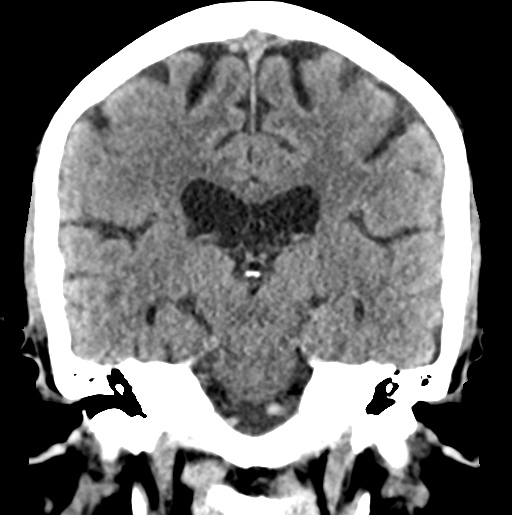
[im 37/67  brain]
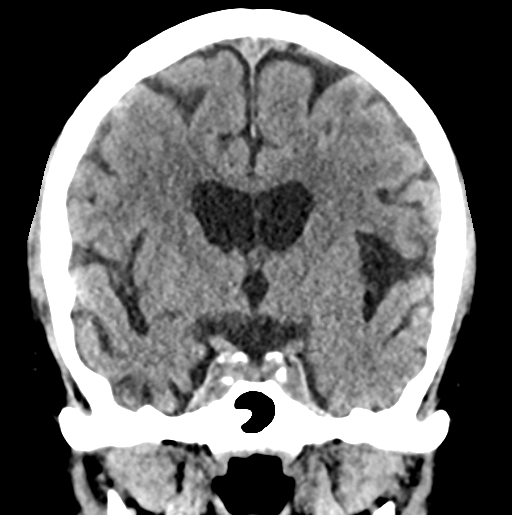

[Series 6: sag soft · sagittal · 0.32mm/px · 3 of 48 slices shown]
[im 16/48  brain]
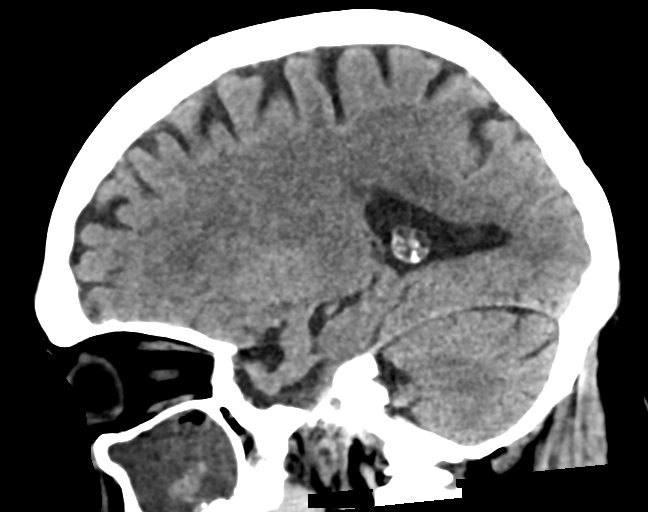
[im 24/48  brain]
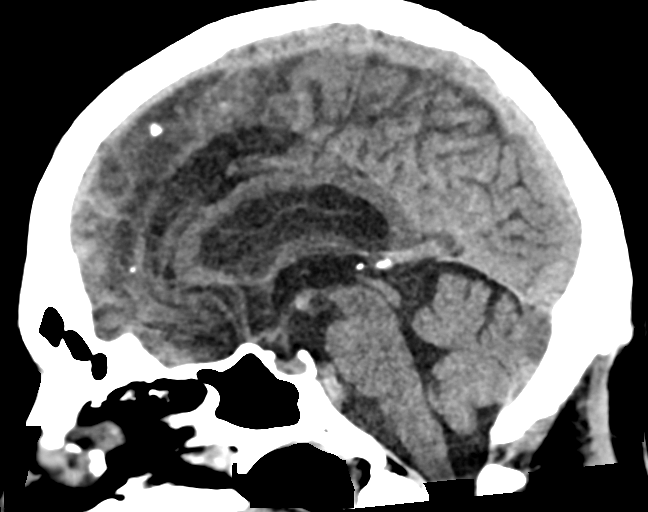
[im 32/48  brain]
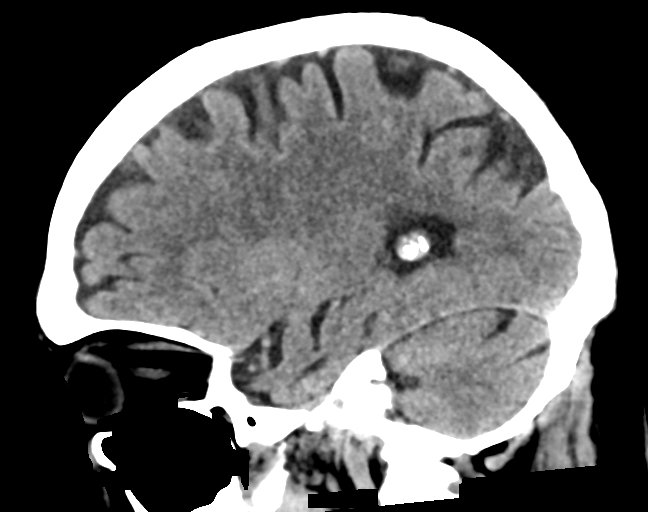

[16 of 47 positions shown; findings below may reference images not displayed]

FINDINGS: Brain: There is no evidence of acute infarct, intracranial
hemorrhage, mass, midline shift, or extra-axial fluid collection.. A
chronic left MCA infarct is again seen involving the insula and
frontal operculum. Bilateral periventricular white matter
hypodensities are similar to the prior study and nonspecific but
compatible with mild chronic small vessel ischemic disease. Mild
cerebral atrophy is unchanged.

Vascular: Calcified atherosclerosis at the skull base. No hyperdense
vessel.

Skull: No fracture or suspicious osseous lesion.

Sinuses/Orbits: New, near complete opacification of the right
maxillary sinus. Mild left greater than right ethmoid air cell
opacification. Clear mastoid air cells. Unremarkable orbits.

Other: None.
IMPRESSION: 1. No evidence of acute intracranial abnormality.
2. Chronic ischemia as above.
3. Right maxillary sinusitis.

## 2019-07-11 ENCOUNTER — Ambulatory Visit (INDEPENDENT_AMBULATORY_CARE_PROVIDER_SITE_OTHER): Payer: Medicare Other | Admitting: *Deleted

## 2019-07-11 DIAGNOSIS — I5022 Chronic systolic (congestive) heart failure: Secondary | ICD-10-CM

## 2019-07-11 DIAGNOSIS — I480 Paroxysmal atrial fibrillation: Secondary | ICD-10-CM

## 2019-07-11 LAB — CUP PACEART REMOTE DEVICE CHECK
Battery Remaining Longevity: 4 mo
Battery Remaining Percentage: 4 %
Battery Voltage: 2.62 V
Brady Statistic AP VP Percent: 86 %
Brady Statistic AP VS Percent: 2.8 %
Brady Statistic AS VP Percent: 8.1 %
Brady Statistic AS VS Percent: 1.3 %
Brady Statistic RA Percent Paced: 86 %
Date Time Interrogation Session: 20210617020020
HighPow Impedance: 72 Ohm
HighPow Impedance: 72 Ohm
Implantable Lead Implant Date: 20130614
Implantable Lead Implant Date: 20131223
Implantable Lead Implant Date: 20131223
Implantable Lead Location: 753858
Implantable Lead Location: 753859
Implantable Lead Location: 753860
Implantable Lead Model: 4136
Implantable Lead Model: 7122
Implantable Lead Serial Number: 29201222
Implantable Pulse Generator Implant Date: 20131223
Lead Channel Impedance Value: 400 Ohm
Lead Channel Impedance Value: 510 Ohm
Lead Channel Impedance Value: 950 Ohm
Lead Channel Pacing Threshold Amplitude: 0.75 V
Lead Channel Pacing Threshold Amplitude: 0.75 V
Lead Channel Pacing Threshold Amplitude: 1 V
Lead Channel Pacing Threshold Pulse Width: 0.5 ms
Lead Channel Pacing Threshold Pulse Width: 0.5 ms
Lead Channel Pacing Threshold Pulse Width: 0.7 ms
Lead Channel Sensing Intrinsic Amplitude: 1 mV
Lead Channel Sensing Intrinsic Amplitude: 11.2 mV
Lead Channel Setting Pacing Amplitude: 2 V
Lead Channel Setting Pacing Amplitude: 2 V
Lead Channel Setting Pacing Amplitude: 2.5 V
Lead Channel Setting Pacing Pulse Width: 0.5 ms
Lead Channel Setting Pacing Pulse Width: 0.7 ms
Lead Channel Setting Sensing Sensitivity: 0.5 mV
Pulse Gen Serial Number: 7057550

## 2019-07-11 NOTE — Progress Notes (Signed)
Remote ICD transmission.   

## 2019-07-31 ENCOUNTER — Telehealth: Payer: Self-pay | Admitting: *Deleted

## 2019-07-31 NOTE — Telephone Encounter (Signed)
-----   Message from [name], MD sent at 07/29/2019  6:23 PM EDT ----- Remote reviewed.   >98% BiV paced. Battery status is good, but approaching ERI in approximately 3 months. Lead measurements are stable. Heart rate histogram is favorable.  Has moved out of town. He has established Cardiology care with Dr. Orlene Erm in [place] and has an appointment to see Dr. Iran Planas to establish device follow up and plan for generator changeout 09/13/2019. Please release his device f/u to [employer].

## 2019-07-31 NOTE — Telephone Encounter (Signed)
Spoke with Marguarite Arbour at [facility]. Advised that patient has been released in [facility], requested that their clinic enroll the pt as soon as possible as pt is nearing ERI. Shauna verbalizes understanding and will relay message to device team.

## 2019-07-31 NOTE — Telephone Encounter (Signed)
[facility] transfer successful. Spoke with pt's wife, who verbalizes understanding and appreciation of call.  Marked inactive in [employer]. Upcoming transmission appointments scheduled.

## 2019-09-05 DIAGNOSIS — Z85828 Personal history of other malignant neoplasm of skin: Secondary | ICD-10-CM | POA: Diagnosis not present

## 2019-09-05 DIAGNOSIS — L989 Disorder of the skin and subcutaneous tissue, unspecified: Secondary | ICD-10-CM | POA: Diagnosis not present

## 2019-09-06 DIAGNOSIS — B079 Viral wart, unspecified: Secondary | ICD-10-CM | POA: Diagnosis not present

## 2019-09-06 DIAGNOSIS — L821 Other seborrheic keratosis: Secondary | ICD-10-CM | POA: Diagnosis not present

## 2019-09-06 DIAGNOSIS — C44329 Squamous cell carcinoma of skin of other parts of face: Secondary | ICD-10-CM | POA: Diagnosis not present

## 2019-09-06 DIAGNOSIS — B078 Other viral warts: Secondary | ICD-10-CM | POA: Diagnosis not present

## 2019-09-06 DIAGNOSIS — L570 Actinic keratosis: Secondary | ICD-10-CM | POA: Diagnosis not present

## 2019-09-06 DIAGNOSIS — C4442 Squamous cell carcinoma of skin of scalp and neck: Secondary | ICD-10-CM | POA: Diagnosis not present

## 2019-09-06 DIAGNOSIS — L814 Other melanin hyperpigmentation: Secondary | ICD-10-CM | POA: Diagnosis not present

## 2019-09-13 DIAGNOSIS — E119 Type 2 diabetes mellitus without complications: Secondary | ICD-10-CM | POA: Diagnosis not present

## 2019-09-13 DIAGNOSIS — R197 Diarrhea, unspecified: Secondary | ICD-10-CM | POA: Diagnosis not present

## 2019-09-13 DIAGNOSIS — E039 Hypothyroidism, unspecified: Secondary | ICD-10-CM | POA: Diagnosis not present

## 2019-10-01 DIAGNOSIS — L309 Dermatitis, unspecified: Secondary | ICD-10-CM | POA: Diagnosis not present

## 2019-10-01 DIAGNOSIS — C44329 Squamous cell carcinoma of skin of other parts of face: Secondary | ICD-10-CM | POA: Diagnosis not present

## 2019-10-01 DIAGNOSIS — C4442 Squamous cell carcinoma of skin of scalp and neck: Secondary | ICD-10-CM | POA: Diagnosis not present

## 2019-10-12 DIAGNOSIS — J449 Chronic obstructive pulmonary disease, unspecified: Secondary | ICD-10-CM | POA: Diagnosis not present

## 2019-10-12 DIAGNOSIS — R002 Palpitations: Secondary | ICD-10-CM | POA: Diagnosis not present

## 2019-10-12 DIAGNOSIS — Z950 Presence of cardiac pacemaker: Secondary | ICD-10-CM | POA: Diagnosis not present

## 2019-10-12 DIAGNOSIS — I493 Ventricular premature depolarization: Secondary | ICD-10-CM | POA: Diagnosis not present

## 2019-10-12 DIAGNOSIS — E119 Type 2 diabetes mellitus without complications: Secondary | ICD-10-CM | POA: Diagnosis not present

## 2019-10-12 DIAGNOSIS — I509 Heart failure, unspecified: Secondary | ICD-10-CM | POA: Diagnosis not present

## 2019-10-12 DIAGNOSIS — Z87891 Personal history of nicotine dependence: Secondary | ICD-10-CM | POA: Diagnosis not present

## 2019-10-12 DIAGNOSIS — I110 Hypertensive heart disease with heart failure: Secondary | ICD-10-CM | POA: Diagnosis not present

## 2019-10-12 DIAGNOSIS — Z885 Allergy status to narcotic agent status: Secondary | ICD-10-CM | POA: Diagnosis not present

## 2019-10-28 DIAGNOSIS — R296 Repeated falls: Secondary | ICD-10-CM | POA: Diagnosis not present

## 2019-10-28 DIAGNOSIS — R4189 Other symptoms and signs involving cognitive functions and awareness: Secondary | ICD-10-CM | POA: Diagnosis not present

## 2019-10-28 DIAGNOSIS — E1159 Type 2 diabetes mellitus with other circulatory complications: Secondary | ICD-10-CM | POA: Diagnosis not present

## 2019-10-28 DIAGNOSIS — I480 Paroxysmal atrial fibrillation: Secondary | ICD-10-CM | POA: Diagnosis not present

## 2019-10-28 DIAGNOSIS — R0602 Shortness of breath: Secondary | ICD-10-CM | POA: Diagnosis not present

## 2019-10-28 DIAGNOSIS — R0600 Dyspnea, unspecified: Secondary | ICD-10-CM | POA: Diagnosis not present

## 2019-10-28 DIAGNOSIS — F1011 Alcohol abuse, in remission: Secondary | ICD-10-CM | POA: Diagnosis not present

## 2019-10-28 DIAGNOSIS — R531 Weakness: Secondary | ICD-10-CM | POA: Diagnosis not present

## 2019-10-28 DIAGNOSIS — Z79899 Other long term (current) drug therapy: Secondary | ICD-10-CM | POA: Diagnosis not present

## 2019-10-28 DIAGNOSIS — K219 Gastro-esophageal reflux disease without esophagitis: Secondary | ICD-10-CM | POA: Diagnosis not present

## 2019-10-28 DIAGNOSIS — I420 Dilated cardiomyopathy: Secondary | ICD-10-CM | POA: Diagnosis not present

## 2019-10-28 DIAGNOSIS — N183 Chronic kidney disease, stage 3 unspecified: Secondary | ICD-10-CM | POA: Diagnosis not present

## 2019-10-28 DIAGNOSIS — I152 Hypertension secondary to endocrine disorders: Secondary | ICD-10-CM | POA: Diagnosis not present

## 2019-10-28 DIAGNOSIS — E039 Hypothyroidism, unspecified: Secondary | ICD-10-CM | POA: Diagnosis not present

## 2019-10-28 DIAGNOSIS — I5022 Chronic systolic (congestive) heart failure: Secondary | ICD-10-CM | POA: Diagnosis not present

## 2019-10-28 DIAGNOSIS — Z4502 Encounter for adjustment and management of automatic implantable cardiac defibrillator: Secondary | ICD-10-CM | POA: Diagnosis not present

## 2019-10-28 DIAGNOSIS — R918 Other nonspecific abnormal finding of lung field: Secondary | ICD-10-CM | POA: Diagnosis not present

## 2019-10-28 DIAGNOSIS — I110 Hypertensive heart disease with heart failure: Secondary | ICD-10-CM | POA: Diagnosis not present

## 2019-10-28 DIAGNOSIS — Z7901 Long term (current) use of anticoagulants: Secondary | ICD-10-CM | POA: Diagnosis not present

## 2019-10-29 DIAGNOSIS — E785 Hyperlipidemia, unspecified: Secondary | ICD-10-CM | POA: Diagnosis not present

## 2019-10-29 DIAGNOSIS — R4189 Other symptoms and signs involving cognitive functions and awareness: Secondary | ICD-10-CM | POA: Diagnosis not present

## 2019-10-29 DIAGNOSIS — I152 Hypertension secondary to endocrine disorders: Secondary | ICD-10-CM | POA: Diagnosis not present

## 2019-10-29 DIAGNOSIS — R413 Other amnesia: Secondary | ICD-10-CM | POA: Diagnosis not present

## 2019-10-29 DIAGNOSIS — E1169 Type 2 diabetes mellitus with other specified complication: Secondary | ICD-10-CM | POA: Diagnosis not present

## 2019-10-29 DIAGNOSIS — I420 Dilated cardiomyopathy: Secondary | ICD-10-CM | POA: Diagnosis not present

## 2019-10-29 DIAGNOSIS — Z9181 History of falling: Secondary | ICD-10-CM | POA: Diagnosis not present

## 2019-10-29 DIAGNOSIS — R29818 Other symptoms and signs involving the nervous system: Secondary | ICD-10-CM | POA: Diagnosis not present

## 2019-10-29 DIAGNOSIS — E1159 Type 2 diabetes mellitus with other circulatory complications: Secondary | ICD-10-CM | POA: Diagnosis not present

## 2019-10-29 DIAGNOSIS — R531 Weakness: Secondary | ICD-10-CM | POA: Diagnosis not present

## 2019-10-29 DIAGNOSIS — R0602 Shortness of breath: Secondary | ICD-10-CM | POA: Diagnosis not present

## 2019-10-29 DIAGNOSIS — Z4502 Encounter for adjustment and management of automatic implantable cardiac defibrillator: Secondary | ICD-10-CM | POA: Diagnosis not present

## 2019-10-29 DIAGNOSIS — I5043 Acute on chronic combined systolic (congestive) and diastolic (congestive) heart failure: Secondary | ICD-10-CM | POA: Diagnosis not present

## 2019-10-30 DIAGNOSIS — R413 Other amnesia: Secondary | ICD-10-CM | POA: Diagnosis not present

## 2019-10-30 DIAGNOSIS — I5043 Acute on chronic combined systolic (congestive) and diastolic (congestive) heart failure: Secondary | ICD-10-CM | POA: Diagnosis not present

## 2019-10-30 DIAGNOSIS — I5022 Chronic systolic (congestive) heart failure: Secondary | ICD-10-CM | POA: Diagnosis not present

## 2019-10-30 DIAGNOSIS — Z4502 Encounter for adjustment and management of automatic implantable cardiac defibrillator: Secondary | ICD-10-CM | POA: Diagnosis not present

## 2019-10-30 DIAGNOSIS — R29818 Other symptoms and signs involving the nervous system: Secondary | ICD-10-CM | POA: Diagnosis not present

## 2019-10-30 DIAGNOSIS — R4189 Other symptoms and signs involving cognitive functions and awareness: Secondary | ICD-10-CM | POA: Diagnosis not present

## 2019-10-30 DIAGNOSIS — E1169 Type 2 diabetes mellitus with other specified complication: Secondary | ICD-10-CM | POA: Diagnosis not present

## 2019-10-30 DIAGNOSIS — Z9181 History of falling: Secondary | ICD-10-CM | POA: Diagnosis not present

## 2019-10-30 DIAGNOSIS — I420 Dilated cardiomyopathy: Secondary | ICD-10-CM | POA: Diagnosis not present

## 2019-10-30 DIAGNOSIS — R531 Weakness: Secondary | ICD-10-CM | POA: Diagnosis not present

## 2019-10-30 DIAGNOSIS — I152 Hypertension secondary to endocrine disorders: Secondary | ICD-10-CM | POA: Diagnosis not present

## 2019-10-30 DIAGNOSIS — E785 Hyperlipidemia, unspecified: Secondary | ICD-10-CM | POA: Diagnosis not present

## 2019-10-30 DIAGNOSIS — E1159 Type 2 diabetes mellitus with other circulatory complications: Secondary | ICD-10-CM | POA: Diagnosis not present

## 2019-10-31 DIAGNOSIS — Z95810 Presence of automatic (implantable) cardiac defibrillator: Secondary | ICD-10-CM | POA: Diagnosis not present

## 2019-10-31 DIAGNOSIS — Z8249 Family history of ischemic heart disease and other diseases of the circulatory system: Secondary | ICD-10-CM | POA: Diagnosis not present

## 2019-10-31 DIAGNOSIS — E1169 Type 2 diabetes mellitus with other specified complication: Secondary | ICD-10-CM | POA: Diagnosis present

## 2019-10-31 DIAGNOSIS — E1122 Type 2 diabetes mellitus with diabetic chronic kidney disease: Secondary | ICD-10-CM | POA: Diagnosis present

## 2019-10-31 DIAGNOSIS — E876 Hypokalemia: Secondary | ICD-10-CM | POA: Diagnosis not present

## 2019-10-31 DIAGNOSIS — I5043 Acute on chronic combined systolic (congestive) and diastolic (congestive) heart failure: Secondary | ICD-10-CM | POA: Diagnosis present

## 2019-10-31 DIAGNOSIS — Z7984 Long term (current) use of oral hypoglycemic drugs: Secondary | ICD-10-CM | POA: Diagnosis not present

## 2019-10-31 DIAGNOSIS — R2681 Unsteadiness on feet: Secondary | ICD-10-CM | POA: Diagnosis present

## 2019-10-31 DIAGNOSIS — N1831 Chronic kidney disease, stage 3a: Secondary | ICD-10-CM | POA: Diagnosis present

## 2019-10-31 DIAGNOSIS — R0902 Hypoxemia: Secondary | ICD-10-CM | POA: Diagnosis not present

## 2019-10-31 DIAGNOSIS — E039 Hypothyroidism, unspecified: Secondary | ICD-10-CM | POA: Diagnosis present

## 2019-10-31 DIAGNOSIS — Z743 Need for continuous supervision: Secondary | ICD-10-CM | POA: Diagnosis not present

## 2019-10-31 DIAGNOSIS — I480 Paroxysmal atrial fibrillation: Secondary | ICD-10-CM | POA: Diagnosis present

## 2019-10-31 DIAGNOSIS — Z9181 History of falling: Secondary | ICD-10-CM | POA: Diagnosis not present

## 2019-10-31 DIAGNOSIS — M6281 Muscle weakness (generalized): Secondary | ICD-10-CM | POA: Diagnosis present

## 2019-10-31 DIAGNOSIS — R29818 Other symptoms and signs involving the nervous system: Secondary | ICD-10-CM | POA: Diagnosis not present

## 2019-10-31 DIAGNOSIS — R4189 Other symptoms and signs involving cognitive functions and awareness: Secondary | ICD-10-CM | POA: Diagnosis present

## 2019-10-31 DIAGNOSIS — Z4502 Encounter for adjustment and management of automatic implantable cardiac defibrillator: Secondary | ICD-10-CM | POA: Diagnosis not present

## 2019-10-31 DIAGNOSIS — E785 Hyperlipidemia, unspecified: Secondary | ICD-10-CM | POA: Diagnosis present

## 2019-10-31 DIAGNOSIS — F1011 Alcohol abuse, in remission: Secondary | ICD-10-CM | POA: Diagnosis present

## 2019-10-31 DIAGNOSIS — Z885 Allergy status to narcotic agent status: Secondary | ICD-10-CM | POA: Diagnosis not present

## 2019-10-31 DIAGNOSIS — I152 Hypertension secondary to endocrine disorders: Secondary | ICD-10-CM | POA: Diagnosis not present

## 2019-10-31 DIAGNOSIS — G4733 Obstructive sleep apnea (adult) (pediatric): Secondary | ICD-10-CM | POA: Diagnosis present

## 2019-10-31 DIAGNOSIS — Z888 Allergy status to other drugs, medicaments and biological substances status: Secondary | ICD-10-CM | POA: Diagnosis not present

## 2019-10-31 DIAGNOSIS — F339 Major depressive disorder, recurrent, unspecified: Secondary | ICD-10-CM | POA: Diagnosis present

## 2019-10-31 DIAGNOSIS — I130 Hypertensive heart and chronic kidney disease with heart failure and stage 1 through stage 4 chronic kidney disease, or unspecified chronic kidney disease: Secondary | ICD-10-CM | POA: Diagnosis present

## 2019-10-31 DIAGNOSIS — Z8546 Personal history of malignant neoplasm of prostate: Secondary | ICD-10-CM | POA: Diagnosis not present

## 2019-10-31 DIAGNOSIS — R413 Other amnesia: Secondary | ICD-10-CM | POA: Diagnosis not present

## 2019-10-31 DIAGNOSIS — E1159 Type 2 diabetes mellitus with other circulatory complications: Secondary | ICD-10-CM | POA: Diagnosis not present

## 2019-10-31 DIAGNOSIS — I2510 Atherosclerotic heart disease of native coronary artery without angina pectoris: Secondary | ICD-10-CM | POA: Diagnosis present

## 2019-10-31 DIAGNOSIS — R531 Weakness: Secondary | ICD-10-CM | POA: Diagnosis not present

## 2019-10-31 DIAGNOSIS — I420 Dilated cardiomyopathy: Secondary | ICD-10-CM | POA: Diagnosis present

## 2019-10-31 DIAGNOSIS — F419 Anxiety disorder, unspecified: Secondary | ICD-10-CM | POA: Diagnosis present

## 2019-12-07 DIAGNOSIS — N1831 Chronic kidney disease, stage 3a: Secondary | ICD-10-CM | POA: Diagnosis not present

## 2019-12-07 DIAGNOSIS — E039 Hypothyroidism, unspecified: Secondary | ICD-10-CM | POA: Diagnosis not present

## 2019-12-11 DIAGNOSIS — I4891 Unspecified atrial fibrillation: Secondary | ICD-10-CM | POA: Diagnosis not present

## 2019-12-11 DIAGNOSIS — Z7901 Long term (current) use of anticoagulants: Secondary | ICD-10-CM | POA: Diagnosis not present

## 2019-12-11 DIAGNOSIS — E7800 Pure hypercholesterolemia, unspecified: Secondary | ICD-10-CM | POA: Diagnosis not present

## 2019-12-11 DIAGNOSIS — I429 Cardiomyopathy, unspecified: Secondary | ICD-10-CM | POA: Diagnosis not present

## 2019-12-11 DIAGNOSIS — E039 Hypothyroidism, unspecified: Secondary | ICD-10-CM | POA: Diagnosis not present

## 2019-12-11 DIAGNOSIS — F411 Generalized anxiety disorder: Secondary | ICD-10-CM | POA: Diagnosis not present

## 2019-12-11 DIAGNOSIS — E119 Type 2 diabetes mellitus without complications: Secondary | ICD-10-CM | POA: Diagnosis not present

## 2019-12-11 DIAGNOSIS — I5020 Unspecified systolic (congestive) heart failure: Secondary | ICD-10-CM | POA: Diagnosis not present

## 2019-12-23 DIAGNOSIS — G4730 Sleep apnea, unspecified: Secondary | ICD-10-CM | POA: Diagnosis not present

## 2019-12-23 DIAGNOSIS — Z7901 Long term (current) use of anticoagulants: Secondary | ICD-10-CM | POA: Diagnosis not present

## 2019-12-23 DIAGNOSIS — I429 Cardiomyopathy, unspecified: Secondary | ICD-10-CM | POA: Diagnosis not present

## 2019-12-23 DIAGNOSIS — I4891 Unspecified atrial fibrillation: Secondary | ICD-10-CM | POA: Diagnosis not present

## 2019-12-23 DIAGNOSIS — I5020 Unspecified systolic (congestive) heart failure: Secondary | ICD-10-CM | POA: Diagnosis not present

## 2019-12-27 DIAGNOSIS — Z7189 Other specified counseling: Secondary | ICD-10-CM | POA: Diagnosis not present

## 2019-12-27 DIAGNOSIS — G4730 Sleep apnea, unspecified: Secondary | ICD-10-CM | POA: Diagnosis not present

## 2019-12-27 DIAGNOSIS — I429 Cardiomyopathy, unspecified: Secondary | ICD-10-CM | POA: Diagnosis not present

## 2019-12-27 DIAGNOSIS — I5020 Unspecified systolic (congestive) heart failure: Secondary | ICD-10-CM | POA: Diagnosis not present

## 2019-12-27 DIAGNOSIS — I4891 Unspecified atrial fibrillation: Secondary | ICD-10-CM | POA: Diagnosis not present

## 2019-12-27 DIAGNOSIS — Z7901 Long term (current) use of anticoagulants: Secondary | ICD-10-CM | POA: Diagnosis not present

## 2019-12-30 DIAGNOSIS — R5381 Other malaise: Secondary | ICD-10-CM | POA: Diagnosis not present

## 2019-12-30 DIAGNOSIS — I5042 Chronic combined systolic (congestive) and diastolic (congestive) heart failure: Secondary | ICD-10-CM | POA: Diagnosis not present

## 2019-12-30 DIAGNOSIS — G4733 Obstructive sleep apnea (adult) (pediatric): Secondary | ICD-10-CM | POA: Diagnosis not present

## 2019-12-30 DIAGNOSIS — Z87898 Personal history of other specified conditions: Secondary | ICD-10-CM | POA: Diagnosis not present

## 2019-12-30 DIAGNOSIS — I130 Hypertensive heart and chronic kidney disease with heart failure and stage 1 through stage 4 chronic kidney disease, or unspecified chronic kidney disease: Secondary | ICD-10-CM | POA: Diagnosis not present

## 2019-12-30 DIAGNOSIS — E119 Type 2 diabetes mellitus without complications: Secondary | ICD-10-CM | POA: Diagnosis not present

## 2019-12-30 DIAGNOSIS — I4891 Unspecified atrial fibrillation: Secondary | ICD-10-CM | POA: Diagnosis not present

## 2019-12-30 DIAGNOSIS — I420 Dilated cardiomyopathy: Secondary | ICD-10-CM | POA: Diagnosis not present

## 2019-12-30 DIAGNOSIS — R634 Abnormal weight loss: Secondary | ICD-10-CM | POA: Diagnosis not present

## 2019-12-30 DIAGNOSIS — W19XXXA Unspecified fall, initial encounter: Secondary | ICD-10-CM | POA: Diagnosis not present

## 2019-12-30 DIAGNOSIS — R4189 Other symptoms and signs involving cognitive functions and awareness: Secondary | ICD-10-CM | POA: Diagnosis not present

## 2019-12-30 DIAGNOSIS — I480 Paroxysmal atrial fibrillation: Secondary | ICD-10-CM | POA: Diagnosis not present

## 2019-12-30 DIAGNOSIS — G4730 Sleep apnea, unspecified: Secondary | ICD-10-CM | POA: Diagnosis not present

## 2019-12-30 DIAGNOSIS — Z7901 Long term (current) use of anticoagulants: Secondary | ICD-10-CM | POA: Diagnosis not present

## 2019-12-30 DIAGNOSIS — F329 Major depressive disorder, single episode, unspecified: Secondary | ICD-10-CM | POA: Diagnosis not present

## 2019-12-30 DIAGNOSIS — E039 Hypothyroidism, unspecified: Secondary | ICD-10-CM | POA: Diagnosis not present

## 2019-12-30 DIAGNOSIS — I5020 Unspecified systolic (congestive) heart failure: Secondary | ICD-10-CM | POA: Diagnosis not present

## 2019-12-30 DIAGNOSIS — N183 Chronic kidney disease, stage 3 unspecified: Secondary | ICD-10-CM | POA: Diagnosis not present

## 2019-12-31 DIAGNOSIS — I480 Paroxysmal atrial fibrillation: Secondary | ICD-10-CM | POA: Diagnosis not present

## 2019-12-31 DIAGNOSIS — N183 Chronic kidney disease, stage 3 unspecified: Secondary | ICD-10-CM | POA: Diagnosis not present

## 2019-12-31 DIAGNOSIS — I420 Dilated cardiomyopathy: Secondary | ICD-10-CM | POA: Diagnosis not present

## 2019-12-31 DIAGNOSIS — G4733 Obstructive sleep apnea (adult) (pediatric): Secondary | ICD-10-CM | POA: Diagnosis not present

## 2019-12-31 DIAGNOSIS — I130 Hypertensive heart and chronic kidney disease with heart failure and stage 1 through stage 4 chronic kidney disease, or unspecified chronic kidney disease: Secondary | ICD-10-CM | POA: Diagnosis not present

## 2019-12-31 DIAGNOSIS — I5042 Chronic combined systolic (congestive) and diastolic (congestive) heart failure: Secondary | ICD-10-CM | POA: Diagnosis not present

## 2020-01-02 DIAGNOSIS — I480 Paroxysmal atrial fibrillation: Secondary | ICD-10-CM | POA: Diagnosis not present

## 2020-01-02 DIAGNOSIS — I130 Hypertensive heart and chronic kidney disease with heart failure and stage 1 through stage 4 chronic kidney disease, or unspecified chronic kidney disease: Secondary | ICD-10-CM | POA: Diagnosis not present

## 2020-01-02 DIAGNOSIS — I5042 Chronic combined systolic (congestive) and diastolic (congestive) heart failure: Secondary | ICD-10-CM | POA: Diagnosis not present

## 2020-01-02 DIAGNOSIS — N183 Chronic kidney disease, stage 3 unspecified: Secondary | ICD-10-CM | POA: Diagnosis not present

## 2020-01-02 DIAGNOSIS — I420 Dilated cardiomyopathy: Secondary | ICD-10-CM | POA: Diagnosis not present

## 2020-01-02 DIAGNOSIS — G4733 Obstructive sleep apnea (adult) (pediatric): Secondary | ICD-10-CM | POA: Diagnosis not present

## 2020-01-02 DIAGNOSIS — F339 Major depressive disorder, recurrent, unspecified: Secondary | ICD-10-CM | POA: Diagnosis not present

## 2020-01-03 DIAGNOSIS — I420 Dilated cardiomyopathy: Secondary | ICD-10-CM | POA: Diagnosis not present

## 2020-01-03 DIAGNOSIS — I130 Hypertensive heart and chronic kidney disease with heart failure and stage 1 through stage 4 chronic kidney disease, or unspecified chronic kidney disease: Secondary | ICD-10-CM | POA: Diagnosis not present

## 2020-01-03 DIAGNOSIS — I480 Paroxysmal atrial fibrillation: Secondary | ICD-10-CM | POA: Diagnosis not present

## 2020-01-03 DIAGNOSIS — G4733 Obstructive sleep apnea (adult) (pediatric): Secondary | ICD-10-CM | POA: Diagnosis not present

## 2020-01-03 DIAGNOSIS — N183 Chronic kidney disease, stage 3 unspecified: Secondary | ICD-10-CM | POA: Diagnosis not present

## 2020-01-03 DIAGNOSIS — I5042 Chronic combined systolic (congestive) and diastolic (congestive) heart failure: Secondary | ICD-10-CM | POA: Diagnosis not present

## 2020-01-07 DIAGNOSIS — I5042 Chronic combined systolic (congestive) and diastolic (congestive) heart failure: Secondary | ICD-10-CM | POA: Diagnosis not present

## 2020-01-07 DIAGNOSIS — I420 Dilated cardiomyopathy: Secondary | ICD-10-CM | POA: Diagnosis not present

## 2020-01-07 DIAGNOSIS — N183 Chronic kidney disease, stage 3 unspecified: Secondary | ICD-10-CM | POA: Diagnosis not present

## 2020-01-07 DIAGNOSIS — G4733 Obstructive sleep apnea (adult) (pediatric): Secondary | ICD-10-CM | POA: Diagnosis not present

## 2020-01-07 DIAGNOSIS — I130 Hypertensive heart and chronic kidney disease with heart failure and stage 1 through stage 4 chronic kidney disease, or unspecified chronic kidney disease: Secondary | ICD-10-CM | POA: Diagnosis not present

## 2020-01-07 DIAGNOSIS — I480 Paroxysmal atrial fibrillation: Secondary | ICD-10-CM | POA: Diagnosis not present

## 2020-01-09 DIAGNOSIS — G4733 Obstructive sleep apnea (adult) (pediatric): Secondary | ICD-10-CM | POA: Diagnosis not present

## 2020-01-09 DIAGNOSIS — I130 Hypertensive heart and chronic kidney disease with heart failure and stage 1 through stage 4 chronic kidney disease, or unspecified chronic kidney disease: Secondary | ICD-10-CM | POA: Diagnosis not present

## 2020-01-09 DIAGNOSIS — I5042 Chronic combined systolic (congestive) and diastolic (congestive) heart failure: Secondary | ICD-10-CM | POA: Diagnosis not present

## 2020-01-09 DIAGNOSIS — I420 Dilated cardiomyopathy: Secondary | ICD-10-CM | POA: Diagnosis not present

## 2020-01-09 DIAGNOSIS — N183 Chronic kidney disease, stage 3 unspecified: Secondary | ICD-10-CM | POA: Diagnosis not present

## 2020-01-09 DIAGNOSIS — F331 Major depressive disorder, recurrent, moderate: Secondary | ICD-10-CM | POA: Diagnosis not present

## 2020-01-09 DIAGNOSIS — I480 Paroxysmal atrial fibrillation: Secondary | ICD-10-CM | POA: Diagnosis not present

## 2020-01-13 DIAGNOSIS — F331 Major depressive disorder, recurrent, moderate: Secondary | ICD-10-CM | POA: Diagnosis not present

## 2020-01-13 DIAGNOSIS — W19XXXA Unspecified fall, initial encounter: Secondary | ICD-10-CM | POA: Diagnosis not present

## 2020-01-13 DIAGNOSIS — M5382 Other specified dorsopathies, cervical region: Secondary | ICD-10-CM | POA: Diagnosis not present

## 2020-01-14 DIAGNOSIS — I480 Paroxysmal atrial fibrillation: Secondary | ICD-10-CM | POA: Diagnosis not present

## 2020-01-14 DIAGNOSIS — I5042 Chronic combined systolic (congestive) and diastolic (congestive) heart failure: Secondary | ICD-10-CM | POA: Diagnosis not present

## 2020-01-14 DIAGNOSIS — M79674 Pain in right toe(s): Secondary | ICD-10-CM | POA: Diagnosis not present

## 2020-01-14 DIAGNOSIS — M2042 Other hammer toe(s) (acquired), left foot: Secondary | ICD-10-CM | POA: Diagnosis not present

## 2020-01-14 DIAGNOSIS — N183 Chronic kidney disease, stage 3 unspecified: Secondary | ICD-10-CM | POA: Diagnosis not present

## 2020-01-14 DIAGNOSIS — L853 Xerosis cutis: Secondary | ICD-10-CM | POA: Diagnosis not present

## 2020-01-14 DIAGNOSIS — M79675 Pain in left toe(s): Secondary | ICD-10-CM | POA: Diagnosis not present

## 2020-01-14 DIAGNOSIS — G4733 Obstructive sleep apnea (adult) (pediatric): Secondary | ICD-10-CM | POA: Diagnosis not present

## 2020-01-14 DIAGNOSIS — I739 Peripheral vascular disease, unspecified: Secondary | ICD-10-CM | POA: Diagnosis not present

## 2020-01-14 DIAGNOSIS — I420 Dilated cardiomyopathy: Secondary | ICD-10-CM | POA: Diagnosis not present

## 2020-01-14 DIAGNOSIS — B351 Tinea unguium: Secondary | ICD-10-CM | POA: Diagnosis not present

## 2020-01-14 DIAGNOSIS — I130 Hypertensive heart and chronic kidney disease with heart failure and stage 1 through stage 4 chronic kidney disease, or unspecified chronic kidney disease: Secondary | ICD-10-CM | POA: Diagnosis not present

## 2020-01-14 DIAGNOSIS — M2041 Other hammer toe(s) (acquired), right foot: Secondary | ICD-10-CM | POA: Diagnosis not present

## 2020-02-18 ENCOUNTER — Encounter: Payer: Self-pay | Admitting: Internal Medicine

## 2020-02-25 DEATH — deceased
